# Patient Record
Sex: Female | Born: 1935 | Race: White | Hispanic: No | Smoking: Former smoker
Health system: Southern US, Community
[De-identification: ages and names within clinical notes are randomized; demographics above are authoritative.]

## PROBLEM LIST (undated history)

## (undated) DIAGNOSIS — N183 Chronic kidney disease, stage 3 unspecified: Secondary | ICD-10-CM

## (undated) DIAGNOSIS — R112 Nausea with vomiting, unspecified: Secondary | ICD-10-CM

## (undated) DIAGNOSIS — I2510 Atherosclerotic heart disease of native coronary artery without angina pectoris: Secondary | ICD-10-CM

## (undated) DIAGNOSIS — I10 Essential (primary) hypertension: Secondary | ICD-10-CM

## (undated) DIAGNOSIS — K8020 Calculus of gallbladder without cholecystitis without obstruction: Secondary | ICD-10-CM

## (undated) DIAGNOSIS — T8859XA Other complications of anesthesia, initial encounter: Secondary | ICD-10-CM

## (undated) DIAGNOSIS — Z87442 Personal history of urinary calculi: Secondary | ICD-10-CM

## (undated) DIAGNOSIS — Z9889 Other specified postprocedural states: Secondary | ICD-10-CM

## (undated) DIAGNOSIS — I77819 Aortic ectasia, unspecified site: Secondary | ICD-10-CM

## (undated) DIAGNOSIS — H3530 Unspecified macular degeneration: Secondary | ICD-10-CM

## (undated) DIAGNOSIS — Z859 Personal history of malignant neoplasm, unspecified: Secondary | ICD-10-CM

## (undated) DIAGNOSIS — Z85828 Personal history of other malignant neoplasm of skin: Secondary | ICD-10-CM

## (undated) DIAGNOSIS — N201 Calculus of ureter: Secondary | ICD-10-CM

## (undated) DIAGNOSIS — S42309A Unspecified fracture of shaft of humerus, unspecified arm, initial encounter for closed fracture: Secondary | ICD-10-CM

## (undated) DIAGNOSIS — E785 Hyperlipidemia, unspecified: Secondary | ICD-10-CM

## (undated) DIAGNOSIS — I480 Paroxysmal atrial fibrillation: Secondary | ICD-10-CM

## (undated) DIAGNOSIS — Z860100 Personal history of colon polyps, unspecified: Secondary | ICD-10-CM

## (undated) DIAGNOSIS — Z973 Presence of spectacles and contact lenses: Secondary | ICD-10-CM

## (undated) DIAGNOSIS — E538 Deficiency of other specified B group vitamins: Secondary | ICD-10-CM

## (undated) DIAGNOSIS — D510 Vitamin B12 deficiency anemia due to intrinsic factor deficiency: Secondary | ICD-10-CM

## (undated) DIAGNOSIS — M810 Age-related osteoporosis without current pathological fracture: Secondary | ICD-10-CM

## (undated) DIAGNOSIS — Z9861 Coronary angioplasty status: Secondary | ICD-10-CM

## (undated) DIAGNOSIS — S2239XA Fracture of one rib, unspecified side, initial encounter for closed fracture: Secondary | ICD-10-CM

## (undated) DIAGNOSIS — I252 Old myocardial infarction: Secondary | ICD-10-CM

## (undated) DIAGNOSIS — T4145XA Adverse effect of unspecified anesthetic, initial encounter: Secondary | ICD-10-CM

## (undated) HISTORY — PX: UMBILICAL HERNIA REPAIR: SHX196

## (undated) HISTORY — PX: DILATION AND CURETTAGE OF UTERUS: SHX78

## (undated) HISTORY — PX: NEUROPLASTY / TRANSPOSITION ULNAR NERVE AT ELBOW: SUR895

## (undated) HISTORY — DX: Deficiency of other specified B group vitamins: E53.8

## (undated) HISTORY — PX: ABDOMINAL HYSTERECTOMY: SHX81

## (undated) HISTORY — DX: Hyperlipidemia, unspecified: E78.5

## (undated) HISTORY — DX: Vitamin B12 deficiency anemia due to intrinsic factor deficiency: D51.0

## (undated) HISTORY — PX: OTHER SURGICAL HISTORY: SHX169

## (undated) HISTORY — PX: CORONARY ANGIOPLASTY: SHX604

## (undated) HISTORY — PX: TONSILLECTOMY: SUR1361

## (undated) HISTORY — DX: Unspecified fracture of shaft of humerus, unspecified arm, initial encounter for closed fracture: S42.309A

## (undated) HISTORY — PX: CATARACT EXTRACTION W/ INTRAOCULAR LENS  IMPLANT, BILATERAL: SHX1307

## (undated) HISTORY — DX: Age-related osteoporosis without current pathological fracture: M81.0

## (undated) HISTORY — DX: Essential (primary) hypertension: I10

## (undated) HISTORY — PX: CYSTOSCOPY/RETROGRADE/URETEROSCOPY/STONE EXTRACTION WITH BASKET: SHX5317

## (undated) HISTORY — PX: KNEE SURGERY: SHX244

## (undated) HISTORY — DX: Personal history of colon polyps, unspecified: Z86.0100

## (undated) HISTORY — DX: Personal history of colonic polyps: Z86.010

## (undated) HISTORY — DX: Calculus of gallbladder without cholecystitis without obstruction: K80.20

## (undated) HISTORY — DX: Aortic ectasia, unspecified site: I77.819

## (undated) HISTORY — DX: Fracture of one rib, unspecified side, initial encounter for closed fracture: S22.39XA

---

## 1992-09-02 DIAGNOSIS — I252 Old myocardial infarction: Secondary | ICD-10-CM

## 1992-09-02 HISTORY — DX: Old myocardial infarction: I25.2

## 1998-06-29 ENCOUNTER — Emergency Department (HOSPITAL_COMMUNITY): Admission: EM | Admit: 1998-06-29 | Discharge: 1998-06-29 | Payer: Self-pay | Admitting: Emergency Medicine

## 1998-06-30 ENCOUNTER — Encounter: Payer: Self-pay | Admitting: Emergency Medicine

## 1999-06-10 ENCOUNTER — Emergency Department (HOSPITAL_COMMUNITY): Admission: EM | Admit: 1999-06-10 | Discharge: 1999-06-10 | Payer: Self-pay | Admitting: *Deleted

## 1999-06-10 ENCOUNTER — Emergency Department (HOSPITAL_COMMUNITY): Admission: EM | Admit: 1999-06-10 | Discharge: 1999-06-10 | Payer: Self-pay | Admitting: Emergency Medicine

## 2000-02-21 ENCOUNTER — Encounter: Payer: Self-pay | Admitting: Specialist

## 2000-02-23 ENCOUNTER — Inpatient Hospital Stay (HOSPITAL_COMMUNITY): Admission: RE | Admit: 2000-02-23 | Discharge: 2000-02-24 | Payer: Self-pay | Admitting: Specialist

## 2001-06-17 ENCOUNTER — Encounter: Payer: Self-pay | Admitting: Specialist

## 2001-06-17 ENCOUNTER — Encounter: Admission: RE | Admit: 2001-06-17 | Discharge: 2001-06-17 | Payer: Self-pay | Admitting: Specialist

## 2002-07-29 ENCOUNTER — Encounter: Payer: Self-pay | Admitting: Internal Medicine

## 2002-07-29 ENCOUNTER — Encounter: Admission: RE | Admit: 2002-07-29 | Discharge: 2002-07-29 | Payer: Self-pay | Admitting: Internal Medicine

## 2002-09-21 ENCOUNTER — Encounter (INDEPENDENT_AMBULATORY_CARE_PROVIDER_SITE_OTHER): Payer: Self-pay | Admitting: Specialist

## 2002-09-21 ENCOUNTER — Ambulatory Visit (HOSPITAL_COMMUNITY): Admission: RE | Admit: 2002-09-21 | Discharge: 2002-09-21 | Payer: Self-pay | Admitting: Gastroenterology

## 2004-01-03 ENCOUNTER — Emergency Department (HOSPITAL_COMMUNITY): Admission: EM | Admit: 2004-01-03 | Discharge: 2004-01-03 | Payer: Self-pay | Admitting: Family Medicine

## 2004-06-27 ENCOUNTER — Ambulatory Visit: Payer: Self-pay | Admitting: Cardiology

## 2004-07-06 ENCOUNTER — Ambulatory Visit: Payer: Self-pay | Admitting: Cardiology

## 2004-08-11 ENCOUNTER — Emergency Department (HOSPITAL_COMMUNITY): Admission: EM | Admit: 2004-08-11 | Discharge: 2004-08-11 | Payer: Self-pay | Admitting: Internal Medicine

## 2004-09-04 ENCOUNTER — Ambulatory Visit: Payer: Self-pay

## 2004-09-04 ENCOUNTER — Ambulatory Visit: Payer: Self-pay | Admitting: Cardiology

## 2004-10-23 ENCOUNTER — Ambulatory Visit: Payer: Self-pay | Admitting: Cardiology

## 2005-10-14 ENCOUNTER — Emergency Department (HOSPITAL_COMMUNITY): Admission: EM | Admit: 2005-10-14 | Discharge: 2005-10-14 | Payer: Self-pay | Admitting: Family Medicine

## 2005-10-17 ENCOUNTER — Ambulatory Visit: Payer: Self-pay | Admitting: Cardiology

## 2006-07-19 ENCOUNTER — Emergency Department (HOSPITAL_COMMUNITY): Admission: EM | Admit: 2006-07-19 | Discharge: 2006-07-19 | Payer: Self-pay | Admitting: Family Medicine

## 2006-08-01 ENCOUNTER — Emergency Department (HOSPITAL_COMMUNITY): Admission: EM | Admit: 2006-08-01 | Discharge: 2006-08-02 | Payer: Self-pay | Admitting: Emergency Medicine

## 2006-08-01 ENCOUNTER — Emergency Department (HOSPITAL_COMMUNITY): Admission: EM | Admit: 2006-08-01 | Discharge: 2006-08-01 | Payer: Self-pay | Admitting: Emergency Medicine

## 2007-03-12 ENCOUNTER — Encounter: Admission: RE | Admit: 2007-03-12 | Discharge: 2007-03-12 | Payer: Self-pay | Admitting: Internal Medicine

## 2007-04-08 ENCOUNTER — Ambulatory Visit: Payer: Self-pay | Admitting: Cardiology

## 2007-04-08 LAB — CONVERTED CEMR LAB
BUN: 14 mg/dL (ref 6–23)
CO2: 28 meq/L (ref 19–32)
Calcium: 9.7 mg/dL (ref 8.4–10.5)
Chloride: 107 meq/L (ref 96–112)
Creatinine, Ser: 0.8 mg/dL (ref 0.4–1.2)
GFR calc Af Amer: 88 mL/min
GFR calc non Af Amer: 73 mL/min
Glucose, Bld: 110 mg/dL — ABNORMAL HIGH (ref 70–99)
Potassium: 3.8 meq/L (ref 3.5–5.1)
Sodium: 142 meq/L (ref 135–145)

## 2007-05-22 ENCOUNTER — Ambulatory Visit: Payer: Self-pay | Admitting: Cardiology

## 2007-12-02 ENCOUNTER — Emergency Department (HOSPITAL_COMMUNITY): Admission: EM | Admit: 2007-12-02 | Discharge: 2007-12-02 | Payer: Self-pay | Admitting: Family Medicine

## 2008-01-06 ENCOUNTER — Ambulatory Visit: Payer: Self-pay | Admitting: Cardiology

## 2008-06-18 ENCOUNTER — Emergency Department (HOSPITAL_COMMUNITY): Admission: EM | Admit: 2008-06-18 | Discharge: 2008-06-19 | Payer: Self-pay | Admitting: Emergency Medicine

## 2008-07-15 ENCOUNTER — Emergency Department (HOSPITAL_COMMUNITY): Admission: EM | Admit: 2008-07-15 | Discharge: 2008-07-15 | Payer: Self-pay | Admitting: Emergency Medicine

## 2008-12-23 ENCOUNTER — Encounter: Payer: Self-pay | Admitting: Cardiology

## 2008-12-29 DIAGNOSIS — I2510 Atherosclerotic heart disease of native coronary artery without angina pectoris: Secondary | ICD-10-CM | POA: Insufficient documentation

## 2008-12-29 DIAGNOSIS — I10 Essential (primary) hypertension: Secondary | ICD-10-CM | POA: Insufficient documentation

## 2008-12-29 DIAGNOSIS — I252 Old myocardial infarction: Secondary | ICD-10-CM | POA: Insufficient documentation

## 2008-12-29 DIAGNOSIS — E785 Hyperlipidemia, unspecified: Secondary | ICD-10-CM | POA: Insufficient documentation

## 2009-02-14 ENCOUNTER — Ambulatory Visit: Payer: Self-pay | Admitting: Cardiology

## 2009-02-14 DIAGNOSIS — K8020 Calculus of gallbladder without cholecystitis without obstruction: Secondary | ICD-10-CM | POA: Insufficient documentation

## 2009-03-21 ENCOUNTER — Emergency Department (HOSPITAL_COMMUNITY): Admission: EM | Admit: 2009-03-21 | Discharge: 2009-03-21 | Payer: Self-pay | Admitting: Emergency Medicine

## 2009-07-31 ENCOUNTER — Inpatient Hospital Stay (HOSPITAL_COMMUNITY): Admission: EM | Admit: 2009-07-31 | Discharge: 2009-08-01 | Payer: Self-pay | Admitting: Emergency Medicine

## 2009-11-02 HISTORY — PX: BREAST EXCISIONAL BIOPSY: SUR124

## 2010-01-27 ENCOUNTER — Encounter: Payer: Self-pay | Admitting: Cardiology

## 2010-03-03 ENCOUNTER — Ambulatory Visit: Payer: Self-pay | Admitting: Cardiology

## 2010-05-31 ENCOUNTER — Emergency Department (HOSPITAL_COMMUNITY)
Admission: EM | Admit: 2010-05-31 | Discharge: 2010-05-31 | Payer: Self-pay | Source: Home / Self Care | Admitting: Family Medicine

## 2010-07-06 NOTE — Assessment & Plan Note (Signed)
Summary: f1y   Visit Type:  1 year follow up Primary Provider:  Valentina Lucks  CC:  Blood pressure issues.  History of Present Illness: Doing well.  Pt is anemic, and now getting B12 shots.  He started recently. Patient has been checking BP, and she had follow up with Dr. Valentina Lucks at that time.  It was low, and then it got high.  They have been titrating since that time, and now making progress.  She feels better about it.  Last LDL wsa 49 and HDL 60.  (01/27/10).  Current Medications (verified): 1)  Atenolol 25 Mg Tabs (Atenolol) .... Take 1/2 Tablet Daily 2)  Lisinopril-Hydrochlorothiazide 10-12.5 Mg Tabs (Lisinopril-Hydrochlorothiazide) .... Take 1 Tablet By Mouth Once A Day 3)  Aspirin 81 Mg Tbec (Aspirin) .... Take One Tablet By Mouth Daily 4)  Lipitor 10 Mg Tabs (Atorvastatin Calcium) .... Take One Tablet By Mouth Daily. 5)  Nitroglycerin 0.4 Mg Subl (Nitroglycerin) .... One Tablet Under Tongue Every 5 Minutes As Needed For Chest Pain---May Repeat Times Three  Allergies: 1)  ! Penicillin 2)  ! * Antihistamines 3)  ! Sulfa 4)  ! * Ivp Dye  Past History:  Past Medical History: Last updated: 12/29/2008 Current Problems:  CAD (ICD-414.00) HYPERTENSION (ICD-401.9) MYOCARDIAL INFARCTION, HX OF (ICD-412) HYPERLIPIDEMIA (ICD-272.4)  Past Surgical History: Last updated: 12/29/2008 Hysterectomy Lumpectomy Angioplasty Right kee surgery Left carpal tunnel release  Family History: Last updated: 12/29/2008   Significant for heart disease and cancer.  Vital Signs:  Patient profile:   77 year old female Height:      66 inches Weight:      176.75 pounds BMI:     28.63 Pulse rate:   71 / minute Pulse rhythm:   regular Resp:     18 per minute BP sitting:   124 / 80  (left arm) Cuff size:   large  Vitals Entered By: Vikki Ports (March 03, 2010 9:12 AM)  Physical Exam  General:  Well developed, well nourished, in no acute distress. Head:  normocephalic and  atraumatic Eyes:  PERRLA/EOM intact; conjunctiva and lids normal. Lungs:  Clear bilaterally to auscultation and percussion. Heart:  Non-displaced PMI, chest non-tender; regular rate and rhythm, S1, S2 without murmurs, rubs or gallops. Carotid upstroke normal, no bruit.  Pulses:  pulses normal in all 4 extremities Extremities:  No clubbing or cyanosis. Neurologic:  Alert and oriented x 3.   EKG  Procedure date:  03/03/2010  Findings:      NSR.  LAD.  Delay in R wave progression.  No change from prior tracing.    Impression & Recommendations:  Problem # 1:  CAD (ICD-414.00) stable at present.  No chest pain.  Last PCI about fifteen years ago.  Doing well. Her updated medication list for this problem includes:    Atenolol 25 Mg Tabs (Atenolol) .[name]... Take 1/2 tablet daily    Lisinopril-hydrochlorothiazide 10-12.5 Mg Tabs (Lisinopril-hydrochlorothiazide) .[name]... Take 1 tablet by mouth once a day    Aspirin 81 Mg Tbec (Aspirin) .[name]... Take one tablet by mouth daily    Nitroglycerin 0.4 Mg Subl (Nitroglycerin) ..... One tablet under tongue every 5 minutes as needed for chest pain---may repeat times three  Problem # 2:  HYPERTENSION (ICD-401.9) see notes about recent adjustments.  She is stable on present regimen. Her updated medication list for this problem includes:    Atenolol 25 Mg Tabs (Atenolol) .[name]... Take 1/2 tablet daily    Lisinopril-hydrochlorothiazide 10-12.5 Mg Tabs (Lisinopril-hydrochlorothiazide) .Marland KitchenMarland KitchenMarland KitchenMarland Kitchen  Take 1 tablet by mouth once a day    Aspirin 81 Mg Tbec (Aspirin) .[name]... Take one tablet by mouth daily  Problem # 3:  HYPERLIPIDEMIA (ICD-272.4) Nicely controlled by Dr. Valentina Lucks. Her updated medication list for this problem includes:    Lipitor 10 Mg Tabs (Atorvastatin calcium) .[name]... Take one tablet by mouth daily.  Patient Instructions: 1)  Your physician recommends that you schedule a follow-up appointment in: 12 months 2)  Your physician recommends that you continue on  your current medications as directed. Please refer to the Current Medication list given to you today.

## 2010-07-06 NOTE — Assessment & Plan Note (Signed)
Summary: f1y   Visit Type:  1 year follow up  CC:  Some chest pains.  History of Present Illness: Patient has been having some chest pain.  She thinks it is secondary to gall stones.  Will have occasional attacks with greasy foods.  The episodes are largely related to upper epigastrium, and like a belt around upper abdomen.  No exertional chest tightness.  Had a recent problem with BP, and Dr. Valentina Lucks cut down. Has been working with Dr. Valentina Lucks.  Current Medications (verified): 1)  Atenolol 25 Mg Tabs (Atenolol) .... Take 1/2 Tablet Daily 2)  Lisinopril-Hydrochlorothiazide 10-12.5 Mg Tabs (Lisinopril-Hydrochlorothiazide) .... Take 1 Tablet By Mouth Once A Day 3)  Aspirin 81 Mg Tbec (Aspirin) .... Take One Tablet By Mouth Daily 4)  Lipitor 10 Mg Tabs (Atorvastatin Calcium) .... Take One Tablet By Mouth Daily. 5)  Nitroglycerin 0.4 Mg Subl (Nitroglycerin) .... One Tablet Under Tongue Every 5 Minutes As Needed For Chest Pain---May Repeat Times Three  Allergies: 1)  ! Penicillin 2)  ! * Antihistamines 3)  ! Sulfa 4)  ! * Ivp Dye  Vital Signs:  Patient profile:   77 year old female Height:      66 inches Weight:      182 pounds Pulse rate:   73 / minute Pulse rhythm:   regular Resp:     18 per minute BP sitting:   148 / 92  (left arm) Cuff size:   large  Vitals Entered By: Vikki Ports (February 14, 2009 11:14 AM)  Physical Exam  General:  Well developed, well nourished, in no acute distress. Lungs:  Clear bilaterally to auscultation and percussion. Heart:  Non-displaced PMI, chest non-tender; regular rate and rhythm, S1, S2 without murmurs, rubs or gallops. Carotid upstroke normal, no bruit. Normal abdominal aortic size, no bruits. Femorals normal pulses, no bruits. Pedals normal pulses. No edema, no varicosities. Extremities:  No clubbing or cyanosis.   EKG  Procedure date:  02/14/2009  Findings:      left axis deviation.  Delay R wave progression.  Non specific T  abnormality.  Impression & Recommendations:  Problem # 1:  CHOLELITHIASIS (ICD-574.20) Been encouraged by surgeon to hold off on surgery.  Problem # 2:  CAD (ICD-414.00)  No suggestive symptoms. Her updated medication list for this problem includes:    Atenolol 25 Mg Tabs (Atenolol) .[name]... Take 1/2 tablet daily    Lisinopril-hydrochlorothiazide 10-12.5 Mg Tabs (Lisinopril-hydrochlorothiazide) .[name]... Take 1 tablet by mouth once a day    Aspirin 81 Mg Tbec (Aspirin) .[name]... Take one tablet by mouth daily    Nitroglycerin 0.4 Mg Subl (Nitroglycerin) ..... One tablet under tongue every 5 minutes as needed for chest pain---may repeat times three  Orders: EKG w/ Interpretation (93000)  Problem # 3:  HYPERTENSION (ICD-401.9) Has restarted her meds now.  Needs to follow up with Dr. Valentina Lucks. Her updated medication list for this problem includes:    Atenolol 25 Mg Tabs (Atenolol) .[name]... Take 1/2 tablet daily    Lisinopril-hydrochlorothiazide 10-12.5 Mg Tabs (Lisinopril-hydrochlorothiazide) .[name]... Take 1 tablet by mouth once a day    Aspirin 81 Mg Tbec (Aspirin) .[name]... Take one tablet by mouth daily  Problem # 4:  HYPERLIPIDEMIA (ICD-272.4) LDL at 54.  HDL 87. Her updated medication list for this problem includes:    Lipitor 10 Mg Tabs (Atorvastatin calcium) .[name]... Take one tablet by mouth daily.  Patient Instructions: 1)  Your physician recommends that you schedule a follow-up appointment in:  1 year Prescriptions: NITROGLYCERIN 0.4 MG SUBL (NITROGLYCERIN) One tablet under tongue every 5 minutes as needed for chest pain---may repeat times three  #25 x 3   Entered by:   Dossie Arbour, RN, BSN   Authorized by:   Ronaldo Miyamoto, MD, [facility]   Signed by:   Dossie Arbour, RN, BSN on 02/14/2009   Method used:   Electronically to        [facility]  [address] [phone]* (retail)       [address]       [place], [place]  [address]       Ph: [phone] or  [phone]       Fax: [phone]   RxID:   [id number]

## 2010-08-25 LAB — CBC
HCT: 45.6 % (ref 36.0–46.0)
Hemoglobin: 15 g/dL (ref 12.0–15.0)
MCHC: 32.9 g/dL (ref 30.0–36.0)
MCV: 95.3 fL (ref 78.0–100.0)
Platelets: 175 10*3/uL (ref 150–400)
RBC: 4.79 MIL/uL (ref 3.87–5.11)
RDW: 13.9 % (ref 11.5–15.5)
WBC: 4.8 10*3/uL (ref 4.0–10.5)

## 2010-08-25 LAB — CARDIAC PANEL(CRET KIN+CKTOT+MB+TROPI)
CK, MB: 1.6 ng/mL (ref 0.3–4.0)
CK, MB: 1.8 ng/mL (ref 0.3–4.0)
CK, MB: 2 ng/mL (ref 0.3–4.0)
Relative Index: 1.3 (ref 0.0–2.5)
Relative Index: 1.5 (ref 0.0–2.5)
Relative Index: 1.5 (ref 0.0–2.5)
Total CK: 120 U/L (ref 7–177)
Total CK: 122 U/L (ref 7–177)
Total CK: 132 U/L (ref 7–177)
Troponin I: <0.01 ng/mL (ref 0.00–0.06)
Troponin I: <0.01 ng/mL (ref 0.00–0.06)
Troponin I: <0.01 ng/mL (ref 0.00–0.06)

## 2010-08-25 LAB — BASIC METABOLIC PANEL
BUN: 12 mg/dL (ref 6–23)
CO2: 27 mEq/L (ref 19–32)
Calcium: 9.9 mg/dL (ref 8.4–10.5)
Chloride: 108 mEq/L (ref 96–112)
Creatinine, Ser: 0.81 mg/dL (ref 0.4–1.2)
GFR calc Af Amer: >60 mL/min (ref >60)
GFR calc non Af Amer: >60 mL/min (ref >60)
Glucose, Bld: 128 mg/dL — ABNORMAL HIGH (ref 70–99)
Potassium: 3.9 mEq/L (ref 3.5–5.1)
Sodium: 143 mEq/L (ref 135–145)

## 2010-08-25 LAB — URINALYSIS, ROUTINE W REFLEX MICROSCOPIC
Bilirubin Urine: NEGATIVE
Glucose, UA: NEGATIVE mg/dL
Hgb urine dipstick: NEGATIVE
Ketones, ur: NEGATIVE mg/dL
Nitrite: NEGATIVE
Protein, ur: NEGATIVE mg/dL
Specific Gravity, Urine: 1.011 (ref 1.005–1.030)
Urobilinogen, UA: 1 mg/dL (ref 0.0–1.0)
pH: 7.5 (ref 5.0–8.0)

## 2010-08-25 LAB — DIFFERENTIAL
Basophils Absolute: 0 10*3/uL (ref 0.0–0.1)
Basophils Relative: 1 % (ref 0–1)
Eosinophils Absolute: 0.2 10*3/uL (ref 0.0–0.7)
Eosinophils Relative: 5 % (ref 0–5)
Lymphocytes Relative: 34 % (ref 12–46)
Lymphs Abs: 1.6 10*3/uL (ref 0.7–4.0)
Monocytes Absolute: 0.4 10*3/uL (ref 0.1–1.0)
Monocytes Relative: 8 % (ref 3–12)
Neutro Abs: 2.5 10*3/uL (ref 1.7–7.7)
Neutrophils Relative %: 53 % (ref 43–77)

## 2010-08-25 LAB — HEPATIC FUNCTION PANEL
ALT: 18 U/L (ref 0–35)
AST: 25 U/L (ref 0–37)
Albumin: 3.7 g/dL (ref 3.5–5.2)
Alkaline Phosphatase: 99 U/L (ref 39–117)
Bilirubin, Direct: 0.2 mg/dL (ref 0.0–0.3)
Indirect Bilirubin: 1 mg/dL — ABNORMAL HIGH (ref 0.3–0.9)
Total Bilirubin: 1.2 mg/dL (ref 0.3–1.2)
Total Protein: 6.9 g/dL (ref 6.0–8.3)

## 2010-08-25 LAB — POCT CARDIAC MARKERS
CKMB, poc: 1.1 ng/mL (ref 1.0–8.0)
Myoglobin, poc: 80.2 ng/mL (ref 12–200)
Troponin i, poc: <0.05 ng/mL (ref 0.00–0.09)

## 2010-08-25 LAB — LIPASE, BLOOD: Lipase: 25 U/L (ref 11–59)

## 2010-09-18 LAB — URINALYSIS, ROUTINE W REFLEX MICROSCOPIC
Bilirubin Urine: NEGATIVE
Glucose, UA: NEGATIVE mg/dL
Ketones, ur: NEGATIVE mg/dL
Leukocytes, UA: NEGATIVE
Nitrite: NEGATIVE
Protein, ur: NEGATIVE mg/dL
Specific Gravity, Urine: 1.03 (ref 1.005–1.030)
Urobilinogen, UA: 0.2 mg/dL (ref 0.0–1.0)
pH: 5.5 (ref 5.0–8.0)

## 2010-09-18 LAB — CBC
HCT: 39.1 % (ref 36.0–46.0)
Hemoglobin: 13.4 g/dL (ref 12.0–15.0)
MCHC: 34.3 g/dL (ref 30.0–36.0)
MCV: 94.6 fL (ref 78.0–100.0)
Platelets: 194 10*3/uL (ref 150–400)
RBC: 4.14 MIL/uL (ref 3.87–5.11)
RDW: 13.9 % (ref 11.5–15.5)
WBC: 5.2 10*3/uL (ref 4.0–10.5)

## 2010-09-18 LAB — COMPREHENSIVE METABOLIC PANEL
ALT: 15 U/L (ref 0–35)
AST: 21 U/L (ref 0–37)
Albumin: 3.3 g/dL — ABNORMAL LOW (ref 3.5–5.2)
Alkaline Phosphatase: 66 U/L (ref 39–117)
BUN: 16 mg/dL (ref 6–23)
CO2: 28 mEq/L (ref 19–32)
Calcium: 9.8 mg/dL (ref 8.4–10.5)
Chloride: 106 mEq/L (ref 96–112)
Creatinine, Ser: 0.85 mg/dL (ref 0.4–1.2)
GFR calc Af Amer: >60 mL/min (ref >60)
GFR calc non Af Amer: >60 mL/min (ref >60)
Glucose, Bld: 128 mg/dL — ABNORMAL HIGH (ref 70–99)
Potassium: 3.6 mEq/L (ref 3.5–5.1)
Sodium: 143 mEq/L (ref 135–145)
Total Bilirubin: 1 mg/dL (ref 0.3–1.2)
Total Protein: 6.5 g/dL (ref 6.0–8.3)

## 2010-09-18 LAB — DIFFERENTIAL
Basophils Absolute: 0.1 10*3/uL (ref 0.0–0.1)
Basophils Relative: 1 % (ref 0–1)
Eosinophils Absolute: 0.4 10*3/uL (ref 0.0–0.7)
Eosinophils Relative: 7 % — ABNORMAL HIGH (ref 0–5)
Lymphocytes Relative: 25 % (ref 12–46)
Lymphs Abs: 1.3 10*3/uL (ref 0.7–4.0)
Monocytes Absolute: 0.6 10*3/uL (ref 0.1–1.0)
Monocytes Relative: 12 % (ref 3–12)
Neutro Abs: 2.9 10*3/uL (ref 1.7–7.7)
Neutrophils Relative %: 56 % (ref 43–77)

## 2010-09-18 LAB — URINE MICROSCOPIC-ADD ON

## 2010-09-18 LAB — LIPASE, BLOOD: Lipase: 28 U/L (ref 11–59)

## 2010-09-19 LAB — DIFFERENTIAL
Basophils Absolute: 0 10*3/uL (ref 0.0–0.1)
Basophils Relative: 1 % (ref 0–1)
Eosinophils Absolute: 0.6 10*3/uL (ref 0.0–0.7)
Eosinophils Relative: 7 % — ABNORMAL HIGH (ref 0–5)
Lymphocytes Relative: 25 % (ref 12–46)
Lymphs Abs: 1.9 10*3/uL (ref 0.7–4.0)
Monocytes Absolute: 0.7 10*3/uL (ref 0.1–1.0)
Monocytes Relative: 10 % (ref 3–12)
Neutro Abs: 4.3 10*3/uL (ref 1.7–7.7)
Neutrophils Relative %: 57 % (ref 43–77)

## 2010-09-19 LAB — CBC
HCT: 45.2 % (ref 36.0–46.0)
Hemoglobin: 15.3 g/dL — ABNORMAL HIGH (ref 12.0–15.0)
MCHC: 33.9 g/dL (ref 30.0–36.0)
MCV: 95.6 fL (ref 78.0–100.0)
Platelets: 150 10*3/uL (ref 150–400)
RBC: 4.73 MIL/uL (ref 3.87–5.11)
RDW: 14.5 % (ref 11.5–15.5)
WBC: 7.6 10*3/uL (ref 4.0–10.5)

## 2010-09-19 LAB — COMPREHENSIVE METABOLIC PANEL
ALT: 22 U/L (ref 0–35)
AST: 22 U/L (ref 0–37)
Albumin: 3.6 g/dL (ref 3.5–5.2)
Alkaline Phosphatase: 81 U/L (ref 39–117)
BUN: 10 mg/dL (ref 6–23)
CO2: 26 mEq/L (ref 19–32)
Calcium: 9.6 mg/dL (ref 8.4–10.5)
Chloride: 106 mEq/L (ref 96–112)
Creatinine, Ser: 0.82 mg/dL (ref 0.4–1.2)
GFR calc Af Amer: >60 mL/min (ref >60)
GFR calc non Af Amer: >60 mL/min (ref >60)
Glucose, Bld: 108 mg/dL — ABNORMAL HIGH (ref 70–99)
Potassium: 4.2 mEq/L (ref 3.5–5.1)
Sodium: 140 mEq/L (ref 135–145)
Total Bilirubin: 1.4 mg/dL — ABNORMAL HIGH (ref 0.3–1.2)
Total Protein: 6.9 g/dL (ref 6.0–8.3)

## 2010-09-19 LAB — LIPASE, BLOOD: Lipase: 26 U/L (ref 11–59)

## 2010-10-17 NOTE — Assessment & Plan Note (Signed)
[facility]                            CARDIOLOGY OFFICE NOTE   SAUSHA, RAYMOND                        MRN:          [id number]  DATE:04/08/2007                            DOB:          03-19-1923    The patient is in for follow-up.  She really is doing quite well.  She  occasionally has some discomfort in the right upper quadrant, and she  apparently has gallstones that Dr. Valentina Lucks has recommended be considered  for removal.  Her last treadmill was in 2006 and was nonischemic.  The  patient had a [facility] of her left anterior descending artery in April of  1994.  She has not had recurrent ischemia since that time.  She feels  well.   MEDICATIONS:  1. Atenolol 25 mg 1/2 tablet daily.  2. Lisinopril hydrochlorothiazide 10/12.5 mg daily.  3. Aspirin 81 mg daily.  4. Lipitor 10 mg daily.  Her LDL apparently has been under control.   PHYSICAL EXAMINATION:  GENERAL:  She is alert and oriented.  VITAL SIGNS:  Weight 183, blood pressure 134/80, pulse 75.  LUNGS:  Fields are clear.  HEART:  Rhythm is regular.   The EKG reveals normal sinus rhythm.  There is nonspecific T wave  abnormalities and a leftward oriented axis.  When compared to the  previous tracings, there is no significant interval change other than  the T wave flattening.  When compared to the previous tracing, the T  wave flattening is slightly more prominent than on previous tracings.   Overall she is stable.  I am in receipt of Dr. Jone Baseman laboratory  studies which include a sugar of 114.  Her LDL is 48 and her HDL is 65,  so these were excellent.  Her potassium was 3.6 back in the early part  of the year.  We will go ahead and repeat a potassium level because in  fact I think that the T wave flattening could be related to that.  Moreover, she did present with some chest pain.   I have suggested that we do an exercise treadmill study.  This will be  done prior to her surgery.  She  wants to defer this at the present time  until January.  She does have multiple small gallstones by gallbladder  and she had a CT angio of the chest which revealed no evidence of  pulmonary embolism, but cholelithiasis.   Her enzymes in the hospital were negative.  As a result, we will go  ahead and get a basic metabolic profile.  I have suggested the treadmill  and we will try to get this done within the next few months as per her  request of timing.  Should she have problems in the interim, she is to  contact us.  I would suggest a treadmill be done prior to any  intervention such as a cholecystectomy.     Arturo Morton. Riley Kill, MD, [facility]  Electronically Signed    TDS/MedQ  DD: 04/08/2007  DT: 04/09/2007  Job #: [id number]   cc:   Excell Seltzer.  Laurann Montana, M.D.

## 2010-10-17 NOTE — Letter (Signed)
January 06, 2008    Thora Lance, M.D.  [address]  [place], [place] [address]   RE:  Shelly Obrien, Shelly Obrien  MRN:  [id number]  /  DOB:  1922-06-13   Dear Jonny Ruiz,   I had the pleasure of seeing Shelly Obrien in the office today in  followup.  She told me that she did not go ahead and have her  cholecystectomy done.  She has very rare episodes, and she told me that  you felt that given her age, it would probably be the best to hold off  unless it became a serious issue.  From a cardiac standpoint, she seems  to be doing beautifully.   On physical, the blood pressure is 130/84, the pulse is 59.  The lung  fields are clear and the cardiac rhythm is regular.   Her electrocardiogram reveals left axis deviation, otherwise  unremarkable.  I am pleased with how she is doing.  I made no  recommendations with regard to her medications and thought that this  would be best managed by you.  We will be happy to see her back in  followup in any time.  We have scheduled her for a 1-year cardiology  visit, but if she needs to be seen sooner, please do not hesitate to let  me know and I will be happy to do so.    Sincerely,      Arturo Morton. Riley Kill, MD, [facility]  Electronically Signed    TDS/MedQ  DD: 01/06/2008  DT: 01/07/2008  Job #: [id number]

## 2010-10-17 NOTE — Letter (Signed)
May 22, 2007    Thora Lance, M.D.  [address]  [place], [place] [address]   RE:  Shelly Obrien, Shelly Obrien  MRN:  [id number]  /  DOB:  20-Aug-1922   Dear Shelly Obrien:   Shelly Obrien underwent exercise testing today.  She had no evidence of  exercise induced chest pain or significant ST depression.  Her exercise  tolerance load was moderate.  I see no contraindications for her  proceeding on with gallbladder surgery if this is necessary.  We will be  happy to see her during her hospitalization.  Thanks for allowing me to  share in her care.    Sincerely,      Arturo Morton. Riley Kill, MD, [facility]  Electronically Signed    TDS/MedQ  DD: 05/22/2007  DT: 05/23/2007  Job #: [id number]

## 2010-10-17 NOTE — Assessment & Plan Note (Signed)
[facility]                            CARDIOLOGY OFFICE NOTE   NAME:CARTERShaniqua, Guillot                        MRN:          [id number]  DATE:05/22/2007                            DOB:          12-Aug-1922    Exercised 4 minutes 37 seconds, maximum heart rate 133, percent of PMHR  is 97%.  Comments:  Rheba exercised today on the Bruce protocol, exercise  tolerance was fair, she experienced no chest pain, the test was  terminated due to fatigue.  There was left axis deviation.  Maximum  stress, there was a rare premature atrial contraction, but there was no  significant ST depression.  The study was felt to be negative for  ischemia.   Ms. Lemaire is a very delightful lady.  She has gallstones, she needs  cholecystectomy.  The current study demonstrates no evidence of exercise-  induced chest pain or significant ischemia.  She previously underwent  percutaneous intervention of the left anterior descending artery and has  done clinically well since that time.  We will see her back in followup.     Arturo Morton. Riley Kill, MD, [facility]  Electronically Signed    TDS/MedQ  DD: 05/22/2007  DT: 05/23/2007  Job #: [id number]

## 2010-10-20 NOTE — Discharge Summary (Signed)
[facility]  Patient:    Shelly Obrien, Shelly Obrien                     MRN: [id number] Adm. Date:  [id number] Disc. Date: [id number] Attending:  Montez Morita, Philips John Dictator:   Ralene Bathe, P.A.                           Discharge Summary  ADMISSION DIAGNOSES: 1. 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 2. Hypertension. 3. Remote history of myocardial infarction.  PROCEDURES:  Ulnar nerve transposition at the elbow as well as carpal tunnel release on the left.  Surgeon: [employer]. Montez Morita, M.D, assistant: Druscilla Brownie. Shela Nevin, P.A., general anesthesia.  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  She has severe entrapment and was quite symptomatic.  She, therefore, wished to have decompression. Risks and benefits were discussed with the patient.  She is in agreement with procedure.  HOSPITAL COURSE:  The patient was admitted, underwent the above-named procedure and tolerated this well.  All appropriate IV antibiotics and analgesics were provided.  She was kept overnight for pain control and neurovascular checks.  On postop day #1, she was afebrile.  Vitals signs were stable, and she was neurovascularly intact.  Some thumb numbness had improved. She was still a bit numb in the ulnar nerve distribution as expected.  Her pain was controlled on p.o. analgesics.  She was ambulating and voiding without difficulty.  At this time she was felt medically and orthopedically stable for discharge to home.  LABORATORY DATA:  Admission hemogram and chemistries were within normal limits except for a mild elevation of glucose to 177.  Urinalysis was within normal limits.  X-rays showed mild cardiomegaly and parenchymal changes suggestive of asthma or chronic bronchitis.  CONDITION UPON DISCHARGE:  Stable and improved.  DISCHARGE PLAN:  The patient  is being discharged to home in the care of her family.  Wear sling when up.  Keep cast clean and dry.  Follow two weeks from date of surgery, call for time.  Resume home medications and home diet.  She was given the following prescriptions: 1. Percocet 5 mg, #40, 1 to 2 q.4-6h. p.r.n. pain. 2. Skelaxin 400 mg, #60, 1 to 2 q.6h. p.r.n. spasm. 3. Vioxx samples were left at the the office.  She will go by and pick them    up. DD:  03/13/00 TD:  03/14/00 Job: [id number]

## 2010-10-20 NOTE — Op Note (Signed)
[facility]  Patient:    Shelly Obrien, ANDERSSON                     MRN: [id number] Proc. Date: 02/23/00 Attending:  Philips J. Montez Morita, M.D.                           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  OPERATIVE PROCEDURE:  Release of carpal tunnel, neurolysis of the ulnar nerve with anterior transfer of subcutaneous tissue.  SURGEON:  Philips J. Montez Morita, M.D.  ASSISTANT:  Cherly Beach, P.A.  ANESTHESIA:  General.  DESCRIPTION OF PROCEDURE:  After suitable general anesthesia, the left arm was prepped and draped.  A sterile tourniquet was used and after exsanguination, was inflated to 250 mmHg. DD:  02/23/00 TD:  02/25/00 Job: [id number]

## 2010-10-20 NOTE — Op Note (Signed)
[facility]  Patient:    Shelly Obrien, Shelly Obrien                     MRN: [id number] Proc. Date: 02/23/00 Adm. Date:  [id number] Disc. Date: [id number] Attending:  Montez Morita, Philips John                           Operative Report  PREOPERATIVE DIAGNOSIS:  Left tardy ulnar nerve palsy and left carpal tunnel.  POSTOPERATIVE DIAGNOSIS:  Left tardy ulnar nerve palsy and left carpal tunnel.  OPERATIVE PROCEDURES: 1. Left carpal tunnel release. 2. Neurolysis of the left ulnar nerve. 3. Anterior transfer in the subcutaneous tissue of the ulnar nerve.  SURGEON:  Philips J. Montez Morita, M.D.  ASSISTANT:  Druscilla Brownie. Shela Nevin, P.A.  DESCRIPTION OF PROCEDURE:  After suitable general anesthesia, the left arm was prepped and draped routinely which included the use of a sterile tourniquet. Esmarch was used to exsanguinate it, and the tourniquet was inflated to 250 mmHg.  The carpal tunnel was released first, using an S-shaped incision in the thenar crease with a zigzag at the wrist.  Tunnel was exposed with retractors and use of ______ loops, the bipolar for coagulation and with a pair of Mayo scissors, released all the way through quite a bit of thick, heavy synovitis was freed up with blunt dissection.  The wound was closed with interrupted nylon and compression dressing applied.  We then went to the elbow where a medial incision was made proximal to the medial epicondyle extending over it and then somewhat anteriorly.  A nice subcutaneous flap was created.  The ulnar nerve was then freed up in the groove and was markedly adherent, particularly at the elbow with adhesions and contractures.  The articular branches were sacrificed and was allowed to sublux the nerve forward, and it was held in the forward position with a tunnel created by suturing the deep fat of the anterior flap over the medial epicondyle and the surrounding tissues proximal and distal.  No impingement of  the nerve was felt to exist pre and posttunnel was well released in the muscle area distally and proximally the ______  was released so there would be no impingement there. The bipolar was used for good coagulation.  Routine wound closure with coated Vicryl and interrupted nylon in the skin, compression dressing, and the tourniquet let down, a cast, a long-arm posterior splint applied with the wrist in slight extension and the elbow at 90 degrees.  She went to recovery in good condition. DD:  02/23/00 TD:  02/25/00 Job: [id number]

## 2010-10-20 NOTE — Op Note (Signed)
   NAME:  LETICA, Shelly Obrien                        ACCOUNT NO.:  [id number]   MEDICAL RECORD NO.:  [id number]                   PATIENT TYPE:  AMB   LOCATION:  ENDO                                 FACILITY:  [facility]   PHYSICIAN:  Danise Edge, M.D.                DATE OF BIRTH:  1922-11-10   DATE OF PROCEDURE:  09/21/2002  DATE OF DISCHARGE:                                 OPERATIVE REPORT   PROCEDURE:  Colonoscopy.   INDICATIONS FOR PROCEDURE:  [name] is a 77 year old female.  She was born July 23, 1922. Her daughter and mother died of colon  cancer.   ENDOSCOPIST:  Charolett Bumpers, M.D.   PREMEDICATION:  Versed 5 mg, Demerol 50 mg .   DESCRIPTION OF PROCEDURE:  After obtaining informed consent, [name] was  placed in the left lateral decubitus position. I administered intravenous  Demerol and intravenous Versed to achieve conscious sedation for the  procedure. The patient's blood pressure, oxygen saturation and cardiac  rhythm were monitored throughout the procedure and documented in the medical  record.   Anal inspection was normal. Digital rectal exam was normal. The Olympus  pediatric video colonoscope was introduced into the rectum and advanced to  the cecum. Colonic preparation for the exam today was excellent.   RECTUM:  Normal.   SIGMOID COLON AND DESCENDING COLON:  Left colonic diverticulosis. At  approximately 45 cm from the anal verge, two 2 mm sessile polyps were  removed with the electrocautery snare.   SPLENIC FLEXURE:  Normal.   TRANSVERSE COLON:  Normal.   HEPATIC FLEXURE:  Normal.   ASCENDING COLON:  Normal.   CECUM AND ILEOCECAL VALVE:  Normal.    ASSESSMENT:  1. Left colonic diverticulosis.  2. Two small polyps were removed from the sigmoid colon at 45 cm from the     anal verge.   RECOMMENDATIONS:  Repeat colonoscopy in five years.                                               Danise Edge, M.D.    MJ/MEDQ  D:   09/21/2002  T:  09/21/2002  Job:  [id number]   cc:   Thora Lance, M.D.  [address]  [place]  [place] [address]  Fax: [phone]

## 2010-10-20 NOTE — H&P (Signed)
[facility]  Patient:    Shelly Obrien, Shelly Obrien                          MRN: [id number] Attending:  Philips J. Montez Morita, M.D. Dictator:   Grayland Jack, P.A. CC:         Arturo Morton. Riley Kill, M.D. [facility]   History and Physical  DATE OF BIRTH:  1923-03-26.  CHIEF COMPLAINT:  Left hand pain and numbness.  HISTORY OF PRESENT ILLNESS:  Shelly Obrien has had over a one-year history of left upper extremity numbness, tingling and pain.  It had gotten significantly worse over the past three to four months.  She has been tried on numerous conservative treatments including oral anti-inflammatories and activity modification.  Unfortunately, Shelly Obrien does have a husband at home who is a bilateral amputee and she does a lot of lifting.  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  It was felt due to these positive findings and her significant increase in symptoms that she would benefit from surgical intervention.  Patients cardiologist, Dr. Arturo Morton. Stuckey, has given [place] premedical clearance, due to her significant history of acute MI some five years ago.  The risks, benefits, alternatives and complications to surgery were discussed with the patient in detail and she agrees to proceed.  ALLERGIES:  PENICILLIN.  CURRENT MEDICATIONS 1. Vasotec 10 mg p.o. q.d. 2. Aspirin 81 mg p.o. q.d.  PAST MEDICAL HISTORY 1. Hypertension. 2. Status post acute MI.  PAST SURGICAL HISTORY:  Significant for lumpectomy, hysterectomy, angioplasty and right knee surgery.  SOCIAL HISTORY:  Again, this patient lives at home with her husband who is a bilateral amputee.  She denies tobacco and alcohol use.  Patient continues to live a very active lifestyle.  FAMILY HISTORY:  Significant for heart disease and cancer.  REVIEW OF SYSTEMS:  GENERAL:  Patient denies fever, chills, weight  changes or malaise.  HEENT:  Patient denies headaches, visual changes, ear pain or pressure, nasal congestion or discharge, sore throats or dysphagia. RESPIRATORY:  Patient denies productive cough, hemoptysis or shortness of breath.  CARDIAC:  Patient denies chest pain, palpitations, angina or dyspnea on exertion.  GI:  Patient denies abdominal pain, nausea, vomiting, constipation, diarrhea or bloody stools.  GU:  Patient denies dysuria, hematuria, increasing frequency or hesitancy.  MUSCULOSKELETAL:  See HPI. HEMATOLOGICAL:  Patient denies history of anemia or bleeding tendencies.  PHYSICAL EXAMINATION  VITAL SIGNS:  Temperature:  Afebrile.  Pulse 74.  Respirations 16.  Blood pressure 135/80.  GENERAL:  This is a well-developed, well-nourished 77 year old female in no acute distress.  HEENT:  Normocephalic, atraumatic.  Pupils are equal, round and reactive to light.  Extraocular muscles are intact.  NECK:  Supple.  No JVD, no lymphadenopathy and no carotid bruits appreciated.  CHEST:  Clear to auscultation.  No rales, no rhonchi and no wheezing bilaterally.  HEART:  Regular rate and rhythm.  No rubs.  No gallops.  No murmurs.  ABDOMEN:  Positive bowel sounds.  Soft and nontender.  No organomegaly appreciated.  BREASTS:  Not done, not pertinent to present illness.  RECTAL:  Not done, not pertinent to present illness.  GU:  Not done, not pertinent to present illness.  EXTREMITIES:  Patients left upper extremity does show some musculature atrophy.  She does have marked weakness.  She is neurovascularly intact.  ADMITTING DIAGNOSES 1. 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 2. Median nerve compression at the left wrist. 3. Hypertension. 4. Status post acute myocardial infarction.  PLAN:  This patient will be admitted to [facility] on February 23, 2000 to undergo anterior transposition of the ulnar nerve and carpal tunnel release, both on the left upper  extremity.  Surgeon will be Dr. Michael Litter. Montez Morita.  The postoperative course has been discussed with the patient in detail.  All questions have been encouraged and answered. DD:  02/21/00 TD:  02/22/00 Job: [id number]

## 2010-10-23 ENCOUNTER — Encounter: Payer: Self-pay | Admitting: Cardiology

## 2010-12-07 ENCOUNTER — Emergency Department (HOSPITAL_COMMUNITY)
Admission: EM | Admit: 2010-12-07 | Discharge: 2010-12-07 | Disposition: A | Discharge status: Home / Self Care | Payer: Medicare Other | Attending: Emergency Medicine | Admitting: Emergency Medicine

## 2010-12-07 ENCOUNTER — Emergency Department (HOSPITAL_COMMUNITY): Payer: Medicare Other

## 2010-12-07 DIAGNOSIS — I10 Essential (primary) hypertension: Secondary | ICD-10-CM | POA: Insufficient documentation

## 2010-12-07 DIAGNOSIS — I2510 Atherosclerotic heart disease of native coronary artery without angina pectoris: Secondary | ICD-10-CM | POA: Insufficient documentation

## 2010-12-07 DIAGNOSIS — N390 Urinary tract infection, site not specified: Secondary | ICD-10-CM | POA: Insufficient documentation

## 2010-12-07 DIAGNOSIS — M549 Dorsalgia, unspecified: Secondary | ICD-10-CM | POA: Insufficient documentation

## 2010-12-07 DIAGNOSIS — K8020 Calculus of gallbladder without cholecystitis without obstruction: Secondary | ICD-10-CM | POA: Insufficient documentation

## 2010-12-07 DIAGNOSIS — E785 Hyperlipidemia, unspecified: Secondary | ICD-10-CM | POA: Insufficient documentation

## 2010-12-07 DIAGNOSIS — I252 Old myocardial infarction: Secondary | ICD-10-CM | POA: Insufficient documentation

## 2010-12-07 DIAGNOSIS — K449 Diaphragmatic hernia without obstruction or gangrene: Secondary | ICD-10-CM | POA: Insufficient documentation

## 2010-12-07 LAB — CBC
HCT: 41.2 % (ref 36.0–46.0)
Hemoglobin: 13.8 g/dL (ref 12.0–15.0)
MCH: 31.2 pg (ref 26.0–34.0)
MCHC: 33.5 g/dL (ref 30.0–36.0)
MCV: 93 fL (ref 78.0–100.0)
Platelets: 172 10*3/uL (ref 150–400)
RBC: 4.43 MIL/uL (ref 3.87–5.11)
RDW: 13.7 % (ref 11.5–15.5)
WBC: 5.5 10*3/uL (ref 4.0–10.5)

## 2010-12-07 LAB — DIFFERENTIAL
Basophils Absolute: 0 10*3/uL (ref 0.0–0.1)
Basophils Relative: 0 % (ref 0–1)
Eosinophils Absolute: 0.2 10*3/uL (ref 0.0–0.7)
Eosinophils Relative: 4 % (ref 0–5)
Lymphocytes Relative: 27 % (ref 12–46)
Lymphs Abs: 1.5 10*3/uL (ref 0.7–4.0)
Monocytes Absolute: 0.5 10*3/uL (ref 0.1–1.0)
Monocytes Relative: 9 % (ref 3–12)
Neutro Abs: 3.3 10*3/uL (ref 1.7–7.7)
Neutrophils Relative %: 60 % (ref 43–77)

## 2010-12-07 LAB — BASIC METABOLIC PANEL
BUN: 13 mg/dL (ref 6–23)
CO2: 24 mEq/L (ref 19–32)
Calcium: 9.7 mg/dL (ref 8.4–10.5)
Chloride: 106 mEq/L (ref 96–112)
Creatinine, Ser: 0.72 mg/dL (ref 0.50–1.10)
GFR calc Af Amer: >60 mL/min (ref >60)
GFR calc non Af Amer: >60 mL/min (ref >60)
Glucose, Bld: 140 mg/dL — ABNORMAL HIGH (ref 70–99)
Potassium: 3.6 mEq/L (ref 3.5–5.1)
Sodium: 140 mEq/L (ref 135–145)

## 2011-02-20 ENCOUNTER — Encounter: Payer: Self-pay | Admitting: Cardiology

## 2011-03-15 ENCOUNTER — Encounter: Payer: Self-pay | Admitting: Cardiology

## 2011-03-19 ENCOUNTER — Ambulatory Visit (INDEPENDENT_AMBULATORY_CARE_PROVIDER_SITE_OTHER): Payer: Medicare Other | Admitting: Cardiology

## 2011-03-19 ENCOUNTER — Encounter: Payer: Self-pay | Admitting: Cardiology

## 2011-03-19 VITALS — BP 150/90 | HR 63 | Resp 18 | Ht 65.0 in | Wt 178.4 lb

## 2011-03-19 DIAGNOSIS — E785 Hyperlipidemia, unspecified: Secondary | ICD-10-CM

## 2011-03-19 DIAGNOSIS — I2510 Atherosclerotic heart disease of native coronary artery without angina pectoris: Secondary | ICD-10-CM

## 2011-03-19 DIAGNOSIS — I10 Essential (primary) hypertension: Secondary | ICD-10-CM

## 2011-03-19 NOTE — Patient Instructions (Signed)
Your physician recommends that you schedule a follow-up appointment as needed.   Your physician recommends that you continue on your current medications as directed. Please refer to the Current Medication list given to you today.  

## 2011-03-21 NOTE — Assessment & Plan Note (Signed)
Followed by Dr. Valentina Lucks.  Tolerates atorvastatin

## 2011-03-21 NOTE — Assessment & Plan Note (Signed)
May still be some elevated.  Has room for adjustments with amlodipine.  Will defer to Dr. Valentina Lucks.

## 2011-03-21 NOTE — Progress Notes (Signed)
HPI:  Shelly Obrien is in for a follow up visit.  She has had prior PCI, and continued to do quite well. She is followed closely by Dr. Valentina Lucks.  She gets out on bus tours, mows her own lawn, but is not walking much other than this.  She remains stable at the present time.  She tolerates Lipitor pretty well.  Amlodipine has been added to her regimen to help with her BP.  She does have macular degeneration, wet type, per patient, in one eye.  She has also had a problem with her foot, yet remains ambulatory.   Current Outpatient Prescriptions  Medication Sig Dispense Refill  . amLODipine (NORVASC) 5 MG tablet Take 5 mg by mouth daily.        [name] aspirin 81 MG tablet Take 81 mg by mouth daily.        [name] atenolol (TENORMIN) 25 MG tablet Take 1/2 tablet daily       . atorvastatin (LIPITOR) 10 MG tablet Take 10 mg by mouth daily.        . Multiple Vitamins-Minerals (ICAPS) CAPS Take 1 capsule by mouth daily.        . nitroGLYCERIN (NITROSTAT) 0.4 MG SL tablet Place 0.4 mg under the tongue every 5 (five) minutes as needed.          Allergies  Allergen Reactions  . Penicillins   . Sulfonamide Derivatives     Past Medical History  Diagnosis Date  . Coronary artery disease   . Hypertension   . Hyperlipidemia   . Myocardial infarct     hx of  . H/O: hysterectomy   . Hx of lumpectomy     Past Surgical History  Procedure Date  . Angioplasty   . Knee surgery     right  . Carpal tunnel release     left    Family History  Problem Relation Age of Onset  . Coronary artery disease      positive family hx of  . Cancer      family hx of    History   Social History  . Marital Status: Widowed    Spouse Name: N/A    Number of Children: N/A  . Years of Education: N/A   Occupational History  . Not on file.   Social History Main Topics  . Smoking status: Never Smoker   . Smokeless tobacco: Not on file  . Alcohol Use: No  . Drug Use: No  . Sexually Active: Not on file   Other Topics  Concern  . Not on file   Social History Narrative  . No narrative on file    ROS: Please see the HPI.  All other systems reviewed and negative.  PHYSICAL EXAM:  BP 150/90  Pulse 63  Resp 18  Ht 5\' 5"  (1.651 m)  Wt 178 lb 6.4 oz (80.922 kg)  BMI 29.69 kg/m2  General: Well developed, well nourished, in no acute distress. Head:  Normocephalic and atraumatic. Neck: no JVD Lungs: Clear to auscultation and percussion. Heart: Normal S1 and S2.  No murmur, rubs or gallops.  Abdomen:  Normal bowel sounds; soft; non tender; no organomegaly Pulses: Pulses normal in all 4 extremities. Extremities: No clubbing or cyanosis. No edema. Neurologic: Alert and oriented x 3.  EKG:  NSR.  Left axis deviation.  ASSESSMENT AND PLAN:

## 2011-03-21 NOTE — Assessment & Plan Note (Signed)
Many years post PCI.  Has continued to do well.  I think we can follow her PRN at this point in time and defer sending her back to Dr. Valentina Lucks, who is nicely managing her issues.  We will be happy to see her at any time in the future.

## 2011-06-05 DIAGNOSIS — S42309A Unspecified fracture of shaft of humerus, unspecified arm, initial encounter for closed fracture: Secondary | ICD-10-CM

## 2011-06-05 HISTORY — DX: Unspecified fracture of shaft of humerus, unspecified arm, initial encounter for closed fracture: S42.309A

## 2011-06-08 DIAGNOSIS — D510 Vitamin B12 deficiency anemia due to intrinsic factor deficiency: Secondary | ICD-10-CM | POA: Diagnosis not present

## 2011-06-14 DIAGNOSIS — Z961 Presence of intraocular lens: Secondary | ICD-10-CM | POA: Diagnosis not present

## 2011-06-14 DIAGNOSIS — Z9849 Cataract extraction status, unspecified eye: Secondary | ICD-10-CM | POA: Diagnosis not present

## 2011-06-14 DIAGNOSIS — H353290 Exudative age-related macular degeneration, unspecified eye, stage unspecified: Secondary | ICD-10-CM | POA: Diagnosis not present

## 2011-06-14 DIAGNOSIS — H353190 Nonexudative age-related macular degeneration, unspecified eye, stage unspecified: Secondary | ICD-10-CM | POA: Diagnosis not present

## 2011-07-10 DIAGNOSIS — D510 Vitamin B12 deficiency anemia due to intrinsic factor deficiency: Secondary | ICD-10-CM | POA: Diagnosis not present

## 2011-07-26 DIAGNOSIS — H353190 Nonexudative age-related macular degeneration, unspecified eye, stage unspecified: Secondary | ICD-10-CM | POA: Diagnosis not present

## 2011-07-26 DIAGNOSIS — H353290 Exudative age-related macular degeneration, unspecified eye, stage unspecified: Secondary | ICD-10-CM | POA: Diagnosis not present

## 2011-07-26 DIAGNOSIS — Z961 Presence of intraocular lens: Secondary | ICD-10-CM | POA: Diagnosis not present

## 2011-07-26 DIAGNOSIS — Z9849 Cataract extraction status, unspecified eye: Secondary | ICD-10-CM | POA: Diagnosis not present

## 2011-08-13 DIAGNOSIS — IMO0001 Reserved for inherently not codable concepts without codable children: Secondary | ICD-10-CM | POA: Diagnosis not present

## 2011-08-13 DIAGNOSIS — I10 Essential (primary) hypertension: Secondary | ICD-10-CM | POA: Diagnosis not present

## 2011-09-06 DIAGNOSIS — H353290 Exudative age-related macular degeneration, unspecified eye, stage unspecified: Secondary | ICD-10-CM | POA: Diagnosis not present

## 2011-09-06 DIAGNOSIS — H353190 Nonexudative age-related macular degeneration, unspecified eye, stage unspecified: Secondary | ICD-10-CM | POA: Diagnosis not present

## 2011-09-06 DIAGNOSIS — Z9849 Cataract extraction status, unspecified eye: Secondary | ICD-10-CM | POA: Diagnosis not present

## 2011-09-06 DIAGNOSIS — Z961 Presence of intraocular lens: Secondary | ICD-10-CM | POA: Diagnosis not present

## 2011-10-15 DIAGNOSIS — I10 Essential (primary) hypertension: Secondary | ICD-10-CM | POA: Diagnosis not present

## 2011-10-15 DIAGNOSIS — E785 Hyperlipidemia, unspecified: Secondary | ICD-10-CM | POA: Diagnosis not present

## 2011-10-15 DIAGNOSIS — R351 Nocturia: Secondary | ICD-10-CM | POA: Diagnosis not present

## 2011-10-15 DIAGNOSIS — IMO0001 Reserved for inherently not codable concepts without codable children: Secondary | ICD-10-CM | POA: Diagnosis not present

## 2011-10-18 DIAGNOSIS — Z961 Presence of intraocular lens: Secondary | ICD-10-CM | POA: Diagnosis not present

## 2011-10-18 DIAGNOSIS — H353290 Exudative age-related macular degeneration, unspecified eye, stage unspecified: Secondary | ICD-10-CM | POA: Diagnosis not present

## 2011-10-18 DIAGNOSIS — H353190 Nonexudative age-related macular degeneration, unspecified eye, stage unspecified: Secondary | ICD-10-CM | POA: Diagnosis not present

## 2011-10-18 DIAGNOSIS — Z9849 Cataract extraction status, unspecified eye: Secondary | ICD-10-CM | POA: Diagnosis not present

## 2011-10-24 DIAGNOSIS — Z1231 Encounter for screening mammogram for malignant neoplasm of breast: Secondary | ICD-10-CM | POA: Diagnosis not present

## 2011-10-24 DIAGNOSIS — N6489 Other specified disorders of breast: Secondary | ICD-10-CM | POA: Diagnosis not present

## 2011-10-25 DIAGNOSIS — N6489 Other specified disorders of breast: Secondary | ICD-10-CM | POA: Diagnosis not present

## 2011-10-25 DIAGNOSIS — Z1231 Encounter for screening mammogram for malignant neoplasm of breast: Secondary | ICD-10-CM | POA: Diagnosis not present

## 2011-11-22 DIAGNOSIS — Z9849 Cataract extraction status, unspecified eye: Secondary | ICD-10-CM | POA: Diagnosis not present

## 2011-11-22 DIAGNOSIS — H353190 Nonexudative age-related macular degeneration, unspecified eye, stage unspecified: Secondary | ICD-10-CM | POA: Diagnosis not present

## 2011-11-22 DIAGNOSIS — H353290 Exudative age-related macular degeneration, unspecified eye, stage unspecified: Secondary | ICD-10-CM | POA: Diagnosis not present

## 2011-11-22 DIAGNOSIS — Z961 Presence of intraocular lens: Secondary | ICD-10-CM | POA: Diagnosis not present

## 2011-11-27 DIAGNOSIS — M1710 Unilateral primary osteoarthritis, unspecified knee: Secondary | ICD-10-CM | POA: Diagnosis not present

## 2011-11-27 DIAGNOSIS — IMO0002 Reserved for concepts with insufficient information to code with codable children: Secondary | ICD-10-CM | POA: Diagnosis not present

## 2011-11-27 DIAGNOSIS — I10 Essential (primary) hypertension: Secondary | ICD-10-CM | POA: Diagnosis not present

## 2011-12-27 DIAGNOSIS — Z961 Presence of intraocular lens: Secondary | ICD-10-CM | POA: Diagnosis not present

## 2011-12-27 DIAGNOSIS — H353290 Exudative age-related macular degeneration, unspecified eye, stage unspecified: Secondary | ICD-10-CM | POA: Diagnosis not present

## 2011-12-27 DIAGNOSIS — Z9849 Cataract extraction status, unspecified eye: Secondary | ICD-10-CM | POA: Diagnosis not present

## 2011-12-27 DIAGNOSIS — H353190 Nonexudative age-related macular degeneration, unspecified eye, stage unspecified: Secondary | ICD-10-CM | POA: Diagnosis not present

## 2012-01-03 ENCOUNTER — Other Ambulatory Visit: Payer: Self-pay | Admitting: Dermatology

## 2012-01-03 DIAGNOSIS — C44319 Basal cell carcinoma of skin of other parts of face: Secondary | ICD-10-CM | POA: Diagnosis not present

## 2012-01-03 DIAGNOSIS — L570 Actinic keratosis: Secondary | ICD-10-CM | POA: Diagnosis not present

## 2012-02-02 ENCOUNTER — Encounter (HOSPITAL_COMMUNITY): Payer: Self-pay | Admitting: Emergency Medicine

## 2012-02-02 ENCOUNTER — Emergency Department (INDEPENDENT_AMBULATORY_CARE_PROVIDER_SITE_OTHER): Payer: Medicare Other

## 2012-02-02 ENCOUNTER — Emergency Department (INDEPENDENT_AMBULATORY_CARE_PROVIDER_SITE_OTHER)
Admission: EM | Admit: 2012-02-02 | Discharge: 2012-02-02 | Disposition: A | Discharge status: Home / Self Care | Payer: Medicare Other | Source: Home / Self Care

## 2012-02-02 DIAGNOSIS — M25839 Other specified joint disorders, unspecified wrist: Secondary | ICD-10-CM | POA: Diagnosis not present

## 2012-02-02 DIAGNOSIS — S62101A Fracture of unspecified carpal bone, right wrist, initial encounter for closed fracture: Secondary | ICD-10-CM

## 2012-02-02 DIAGNOSIS — M25849 Other specified joint disorders, unspecified hand: Secondary | ICD-10-CM | POA: Diagnosis not present

## 2012-02-02 DIAGNOSIS — S52599A Other fractures of lower end of unspecified radius, initial encounter for closed fracture: Secondary | ICD-10-CM | POA: Diagnosis not present

## 2012-02-02 DIAGNOSIS — S62109A Fracture of unspecified carpal bone, unspecified wrist, initial encounter for closed fracture: Secondary | ICD-10-CM | POA: Diagnosis not present

## 2012-02-02 NOTE — ED Notes (Signed)
Numbness to left side of face and left thumb episodic , has had 4 episodes today. Symptoms started weds.

## 2012-02-02 NOTE — Progress Notes (Signed)
Orthopedic Tech Progress Note Patient Details:  Shelly Obrien 1922/09/10 [id number] Sugartong splint appleid to Right UE with instruction; Arm sling applied to Right UE with instruction Ortho Devices Type of Ortho Device: Arm foam sling;Sugartong splint Ortho Device/Splint Location: Sugartong splint and arm sling applied to Rigth UE Ortho Device/Splint Interventions: Application   Asia R Thompson 02/02/2012, 7:20 PM

## 2012-02-02 NOTE — ED Provider Notes (Signed)
History     CSN: [id number]  Arrival date & time 02/02/12  1547   First MD Initiated Contact with Patient 02/02/12 1728      Chief Complaint  Patient presents with  . Arm Pain    fell on arm    (Consider location/radiation/quality/duration/timing/severity/associated sxs/prior treatment) HPI Comments: This lady was cleaning and fell onto her R hand/wrist and hyperextended the wrist. This is her only injury. Denies problems with head, neck , back, chest or other areas.  There is pain , swelling and minor deformity of the R wrist.   Patient is a 77 y.o. female presenting with wrist pain. The history is provided by the patient.  Wrist Pain This is a new problem. The current episode started 3 to 5 hours ago. The problem occurs constantly. The problem has been gradually worsening. Pertinent negatives include no chest pain, no abdominal pain, no headaches and no shortness of breath. The symptoms are aggravated by twisting. Nothing relieves the symptoms. She has tried nothing for the symptoms.    Past Medical History  Diagnosis Date  . Coronary artery disease   . Hypertension   . Hyperlipidemia   . Myocardial infarct     hx of  . H/O: hysterectomy   . Hx of lumpectomy     Past Surgical History  Procedure Date  . Angioplasty   . Knee surgery     right  . Carpal tunnel release     left  . Cardiac surgery     Family History  Problem Relation Age of Onset  . Coronary artery disease      positive family hx of  . Cancer      family hx of    History  Substance Use Topics  . Smoking status: Never Smoker   . Smokeless tobacco: Not on file  . Alcohol Use: No    OB History    Grav Para Term Preterm Abortions TAB SAB Ect Mult Living                  Review of Systems  Constitutional: Negative.  Negative for activity change.  HENT: Negative for neck pain and neck stiffness.   Respiratory: Negative for apnea, cough and shortness of breath.   Cardiovascular: Negative  for chest pain and leg swelling.  Gastrointestinal: Negative for abdominal pain.  Genitourinary: Negative for dysuria and difficulty urinating.  Musculoskeletal: Positive for joint swelling.  Skin: Negative for color change, pallor and rash.  Neurological: Negative for dizziness, syncope, weakness, numbness and headaches.    Allergies  Penicillins and Sulfonamide derivatives  Home Medications   Current Outpatient Rx  Name Route Sig Dispense Refill  . ASPIRIN 81 MG PO TABS Oral Take 81 mg by mouth daily.      . ATENOLOL 25 MG PO TABS  Take 1/2 tablet daily     . ATORVASTATIN CALCIUM 10 MG PO TABS Oral Take 10 mg by mouth daily.      [name] NITROGLYCERIN 0.4 MG SL SUBL Sublingual Place 0.4 mg under the tongue every 5 (five) minutes as needed.      [name] AMLODIPINE BESYLATE 5 MG PO TABS Oral Take 5 mg by mouth daily.      . ICAPS PO CAPS Oral Take 1 capsule by mouth daily.        BP 151/84  Pulse 72  Temp 98.2 F (36.8 C) (Oral)  Resp 16  SpO2 98%  Physical Exam  Constitutional: She is oriented  to person, place, and time. She appears well-developed and well-nourished.  HENT:  Head: Atraumatic.  Neck: Normal range of motion. Neck supple.  Musculoskeletal: She exhibits tenderness.       Unable to provide ROM due to pain, distal N/V, Sensation intact. Pulse 2+, nl color  Neurological: She is alert and oriented to person, place, and time.  Skin: Skin is warm and dry.  Psychiatric: She has a normal mood and affect.    ED Course  Procedures (including critical care time)  Labs Reviewed - No data to display No results found.   No diagnosis found.    MDM  Consulted with Dr. Orlan Leavens who also reviewed the films. St OK to splint and see in office 3 days. Keep elevated, wear sling. She repeatedly denied anything for pain.         Hayden Rasmussen, NP 02/02/12 2154

## 2012-02-02 NOTE — ED Notes (Signed)
Pt states that she tripped over an object in her garage and fell. Tried breaking fall and landed on right arm bending it backwards.

## 2012-02-05 DIAGNOSIS — S52599A Other fractures of lower end of unspecified radius, initial encounter for closed fracture: Secondary | ICD-10-CM | POA: Diagnosis not present

## 2012-02-07 DIAGNOSIS — H353290 Exudative age-related macular degeneration, unspecified eye, stage unspecified: Secondary | ICD-10-CM | POA: Diagnosis not present

## 2012-02-07 DIAGNOSIS — H353190 Nonexudative age-related macular degeneration, unspecified eye, stage unspecified: Secondary | ICD-10-CM | POA: Diagnosis not present

## 2012-02-07 DIAGNOSIS — C4491 Basal cell carcinoma of skin, unspecified: Secondary | ICD-10-CM | POA: Diagnosis not present

## 2012-02-07 DIAGNOSIS — Z9849 Cataract extraction status, unspecified eye: Secondary | ICD-10-CM | POA: Diagnosis not present

## 2012-02-07 DIAGNOSIS — Z961 Presence of intraocular lens: Secondary | ICD-10-CM | POA: Diagnosis not present

## 2012-02-07 NOTE — ED Provider Notes (Signed)
Dx : R wrist fx  Medical screening examination/treatment/procedure(s) were performed by non-physician practitioner and as supervising physician I was immediately available for consultation/collaboration.  Luiz Blare MD   Luiz Blare, MD 02/07/12 2107

## 2012-02-13 DIAGNOSIS — D510 Vitamin B12 deficiency anemia due to intrinsic factor deficiency: Secondary | ICD-10-CM | POA: Diagnosis not present

## 2012-02-13 DIAGNOSIS — E785 Hyperlipidemia, unspecified: Secondary | ICD-10-CM | POA: Diagnosis not present

## 2012-02-13 DIAGNOSIS — I2510 Atherosclerotic heart disease of native coronary artery without angina pectoris: Secondary | ICD-10-CM | POA: Diagnosis not present

## 2012-02-13 DIAGNOSIS — I10 Essential (primary) hypertension: Secondary | ICD-10-CM | POA: Diagnosis not present

## 2012-02-18 DIAGNOSIS — C44319 Basal cell carcinoma of skin of other parts of face: Secondary | ICD-10-CM | POA: Diagnosis not present

## 2012-02-28 ENCOUNTER — Encounter: Payer: Self-pay | Admitting: Cardiology

## 2012-02-29 DIAGNOSIS — S52599A Other fractures of lower end of unspecified radius, initial encounter for closed fracture: Secondary | ICD-10-CM | POA: Diagnosis not present

## 2012-03-13 DIAGNOSIS — M25529 Pain in unspecified elbow: Secondary | ICD-10-CM | POA: Diagnosis not present

## 2012-03-13 DIAGNOSIS — S52599A Other fractures of lower end of unspecified radius, initial encounter for closed fracture: Secondary | ICD-10-CM | POA: Diagnosis not present

## 2012-03-17 DIAGNOSIS — S52599A Other fractures of lower end of unspecified radius, initial encounter for closed fracture: Secondary | ICD-10-CM | POA: Diagnosis not present

## 2012-03-20 DIAGNOSIS — S52599A Other fractures of lower end of unspecified radius, initial encounter for closed fracture: Secondary | ICD-10-CM | POA: Diagnosis not present

## 2012-03-25 DIAGNOSIS — S52599A Other fractures of lower end of unspecified radius, initial encounter for closed fracture: Secondary | ICD-10-CM | POA: Diagnosis not present

## 2012-03-27 DIAGNOSIS — H353290 Exudative age-related macular degeneration, unspecified eye, stage unspecified: Secondary | ICD-10-CM | POA: Diagnosis not present

## 2012-03-27 DIAGNOSIS — H353190 Nonexudative age-related macular degeneration, unspecified eye, stage unspecified: Secondary | ICD-10-CM | POA: Diagnosis not present

## 2012-03-28 DIAGNOSIS — S52599A Other fractures of lower end of unspecified radius, initial encounter for closed fracture: Secondary | ICD-10-CM | POA: Diagnosis not present

## 2012-03-31 DIAGNOSIS — G5600 Carpal tunnel syndrome, unspecified upper limb: Secondary | ICD-10-CM | POA: Diagnosis not present

## 2012-03-31 DIAGNOSIS — S52599A Other fractures of lower end of unspecified radius, initial encounter for closed fracture: Secondary | ICD-10-CM | POA: Diagnosis not present

## 2012-04-03 DIAGNOSIS — S52599A Other fractures of lower end of unspecified radius, initial encounter for closed fracture: Secondary | ICD-10-CM | POA: Diagnosis not present

## 2012-04-09 DIAGNOSIS — S52599A Other fractures of lower end of unspecified radius, initial encounter for closed fracture: Secondary | ICD-10-CM | POA: Diagnosis not present

## 2012-04-11 DIAGNOSIS — S52599A Other fractures of lower end of unspecified radius, initial encounter for closed fracture: Secondary | ICD-10-CM | POA: Diagnosis not present

## 2012-04-15 DIAGNOSIS — Z23 Encounter for immunization: Secondary | ICD-10-CM | POA: Diagnosis not present

## 2012-04-16 DIAGNOSIS — S52599A Other fractures of lower end of unspecified radius, initial encounter for closed fracture: Secondary | ICD-10-CM | POA: Diagnosis not present

## 2012-04-18 DIAGNOSIS — S52599A Other fractures of lower end of unspecified radius, initial encounter for closed fracture: Secondary | ICD-10-CM | POA: Diagnosis not present

## 2012-04-21 DIAGNOSIS — S52599A Other fractures of lower end of unspecified radius, initial encounter for closed fracture: Secondary | ICD-10-CM | POA: Diagnosis not present

## 2012-04-22 DIAGNOSIS — S52599A Other fractures of lower end of unspecified radius, initial encounter for closed fracture: Secondary | ICD-10-CM | POA: Diagnosis not present

## 2012-04-28 DIAGNOSIS — S52599A Other fractures of lower end of unspecified radius, initial encounter for closed fracture: Secondary | ICD-10-CM | POA: Diagnosis not present

## 2012-05-07 DIAGNOSIS — S52599A Other fractures of lower end of unspecified radius, initial encounter for closed fracture: Secondary | ICD-10-CM | POA: Diagnosis not present

## 2012-05-08 DIAGNOSIS — H353290 Exudative age-related macular degeneration, unspecified eye, stage unspecified: Secondary | ICD-10-CM | POA: Diagnosis not present

## 2012-05-08 DIAGNOSIS — H353190 Nonexudative age-related macular degeneration, unspecified eye, stage unspecified: Secondary | ICD-10-CM | POA: Diagnosis not present

## 2012-05-13 DIAGNOSIS — S52599A Other fractures of lower end of unspecified radius, initial encounter for closed fracture: Secondary | ICD-10-CM | POA: Diagnosis not present

## 2012-05-20 DIAGNOSIS — G5600 Carpal tunnel syndrome, unspecified upper limb: Secondary | ICD-10-CM | POA: Diagnosis not present

## 2012-06-17 DIAGNOSIS — S5290XD Unspecified fracture of unspecified forearm, subsequent encounter for closed fracture with routine healing: Secondary | ICD-10-CM | POA: Diagnosis not present

## 2012-06-17 DIAGNOSIS — IMO0001 Reserved for inherently not codable concepts without codable children: Secondary | ICD-10-CM | POA: Diagnosis not present

## 2012-06-19 DIAGNOSIS — H353190 Nonexudative age-related macular degeneration, unspecified eye, stage unspecified: Secondary | ICD-10-CM | POA: Diagnosis not present

## 2012-06-19 DIAGNOSIS — H353290 Exudative age-related macular degeneration, unspecified eye, stage unspecified: Secondary | ICD-10-CM | POA: Diagnosis not present

## 2012-07-30 DIAGNOSIS — H353190 Nonexudative age-related macular degeneration, unspecified eye, stage unspecified: Secondary | ICD-10-CM | POA: Diagnosis not present

## 2012-07-31 DIAGNOSIS — H353290 Exudative age-related macular degeneration, unspecified eye, stage unspecified: Secondary | ICD-10-CM | POA: Diagnosis not present

## 2012-08-01 ENCOUNTER — Emergency Department (HOSPITAL_COMMUNITY)
Admission: EM | Admit: 2012-08-01 | Discharge: 2012-08-01 | Disposition: A | Discharge status: Home / Self Care | Payer: Medicare Other | Attending: Emergency Medicine | Admitting: Emergency Medicine

## 2012-08-01 ENCOUNTER — Encounter (HOSPITAL_COMMUNITY): Payer: Self-pay | Admitting: Emergency Medicine

## 2012-08-01 DIAGNOSIS — Z90710 Acquired absence of both cervix and uterus: Secondary | ICD-10-CM | POA: Insufficient documentation

## 2012-08-01 DIAGNOSIS — Z79899 Other long term (current) drug therapy: Secondary | ICD-10-CM | POA: Insufficient documentation

## 2012-08-01 DIAGNOSIS — E785 Hyperlipidemia, unspecified: Secondary | ICD-10-CM | POA: Diagnosis not present

## 2012-08-01 DIAGNOSIS — I2510 Atherosclerotic heart disease of native coronary artery without angina pectoris: Secondary | ICD-10-CM | POA: Diagnosis not present

## 2012-08-01 DIAGNOSIS — M79609 Pain in unspecified limb: Secondary | ICD-10-CM | POA: Diagnosis not present

## 2012-08-01 DIAGNOSIS — Z7982 Long term (current) use of aspirin: Secondary | ICD-10-CM | POA: Diagnosis not present

## 2012-08-01 DIAGNOSIS — G8921 Chronic pain due to trauma: Secondary | ICD-10-CM | POA: Insufficient documentation

## 2012-08-01 DIAGNOSIS — I10 Essential (primary) hypertension: Secondary | ICD-10-CM | POA: Insufficient documentation

## 2012-08-01 DIAGNOSIS — I252 Old myocardial infarction: Secondary | ICD-10-CM | POA: Insufficient documentation

## 2012-08-01 DIAGNOSIS — Z9889 Other specified postprocedural states: Secondary | ICD-10-CM | POA: Diagnosis not present

## 2012-08-01 DIAGNOSIS — I160 Hypertensive urgency: Secondary | ICD-10-CM

## 2012-08-01 LAB — CBC WITH DIFFERENTIAL/PLATELET
Basophils Absolute: 0 10*3/uL (ref 0.0–0.1)
Basophils Relative: 0 % (ref 0–1)
Eosinophils Absolute: 0.1 10*3/uL (ref 0.0–0.7)
Eosinophils Relative: 2 % (ref 0–5)
HCT: 44.8 % (ref 36.0–46.0)
Hemoglobin: 15.3 g/dL — ABNORMAL HIGH (ref 12.0–15.0)
Lymphocytes Relative: 26 % (ref 12–46)
Lymphs Abs: 1.6 10*3/uL (ref 0.7–4.0)
MCH: 32.3 pg (ref 26.0–34.0)
MCHC: 34.2 g/dL (ref 30.0–36.0)
MCV: 94.5 fL (ref 78.0–100.0)
Monocytes Absolute: 0.4 10*3/uL (ref 0.1–1.0)
Monocytes Relative: 7 % (ref 3–12)
Neutro Abs: 4 10*3/uL (ref 1.7–7.7)
Neutrophils Relative %: 65 % (ref 43–77)
Platelets: 180 10*3/uL (ref 150–400)
RBC: 4.74 MIL/uL (ref 3.87–5.11)
RDW: 13.7 % (ref 11.5–15.5)
WBC: 6.2 10*3/uL (ref 4.0–10.5)

## 2012-08-01 LAB — BASIC METABOLIC PANEL
BUN: 12 mg/dL (ref 6–23)
CO2: 29 mEq/L (ref 19–32)
Calcium: 9.9 mg/dL (ref 8.4–10.5)
Chloride: 105 mEq/L (ref 96–112)
Creatinine, Ser: 0.76 mg/dL (ref 0.50–1.10)
GFR calc Af Amer: 85 mL/min — ABNORMAL LOW (ref >90)
GFR calc non Af Amer: 73 mL/min — ABNORMAL LOW (ref >90)
Glucose, Bld: 123 mg/dL — ABNORMAL HIGH (ref 70–99)
Potassium: 3.9 mEq/L (ref 3.5–5.1)
Sodium: 143 mEq/L (ref 135–145)

## 2012-08-01 LAB — URINALYSIS, ROUTINE W REFLEX MICROSCOPIC
Bilirubin Urine: NEGATIVE
Glucose, UA: NEGATIVE mg/dL
Hgb urine dipstick: NEGATIVE
Ketones, ur: NEGATIVE mg/dL
Leukocytes, UA: NEGATIVE
Nitrite: NEGATIVE
Protein, ur: NEGATIVE mg/dL
Specific Gravity, Urine: 1.013 (ref 1.005–1.030)
Urobilinogen, UA: 1 mg/dL (ref 0.0–1.0)
pH: 7.5 (ref 5.0–8.0)

## 2012-08-01 NOTE — ED Notes (Signed)
Pt c/o hypertension x several days; pt sts pain down bilateral arms and one episode of dizziness

## 2012-08-01 NOTE — ED Provider Notes (Signed)
History     CSN: [id number]  Arrival date & time 08/01/12  1124   First MD Initiated Contact with Patient 08/01/12 1222      Chief Complaint  Patient presents with  . Hypertension    (Consider location/radiation/quality/duration/timing/severity/associated sxs/prior treatment) HPI The patient presents with concern of hypertension.  She denies focal new pain, contrary to nursing note.  She states the past 3 days she's had elevated blood pressure readings, without focal new chest pain, dyspnea, headache, falls, ataxia.  She has chronic right upper extremity pain since a fracture last year.  She notes that this is unchanged.  She denies any nausea, vomiting, diarrhea. Several days ago she had one episode of dizziness, for which she took over-the-counter medication.  She states that her numeric abnormalities began after that episode. She also recently had a ophthalmologic procedure.  She denies new eye complaints or concerns.  Past Medical History  Diagnosis Date  . Coronary artery disease   . Hypertension   . Hyperlipidemia   . Myocardial infarct     hx of  . H/O: hysterectomy   . Hx of lumpectomy     Past Surgical History  Procedure Laterality Date  . Angioplasty    . Knee surgery      right  . Carpal tunnel release      left  . Cardiac surgery      Family History  Problem Relation Age of Onset  . Coronary artery disease      positive family hx of  . Cancer      family hx of    History  Substance Use Topics  . Smoking status: Never Smoker   . Smokeless tobacco: Not on file  . Alcohol Use: No    OB History   Grav Para Term Preterm Abortions TAB SAB Ect Mult Living                  Review of Systems  Constitutional:       Per HPI, otherwise negative  HENT:       Per HPI, otherwise negative  Respiratory:       Per HPI, otherwise negative  Cardiovascular:       Per HPI, otherwise negative  Gastrointestinal: Negative for vomiting.  Endocrine:   Negative aside from HPI  Genitourinary:       Neg aside from HPI   Musculoskeletal:       Per HPI, otherwise negative  Skin: Negative.   Neurological: Negative for syncope.    Allergies  Penicillins and Sulfonamide derivatives  Home Medications   Current Outpatient Rx  Name  Route  Sig  Dispense  Refill  . amLODipine (NORVASC) 5 MG tablet   Oral   Take 5 mg by mouth daily.           [name] aspirin 81 MG tablet   Oral   Take 81 mg by mouth daily.           [name] atenolol (TENORMIN) 25 MG tablet   Oral   Take 12.5 mg by mouth daily. Take 1/2 tablet daily         . atorvastatin (LIPITOR) 10 MG tablet   Oral   Take 10 mg by mouth daily.           . Multiple Vitamins-Minerals (ICAPS) CAPS   Oral   Take 1 capsule by mouth daily.           . nitroGLYCERIN (NITROSTAT) 0.4  MG SL tablet   Sublingual   Place 0.4 mg under the tongue every 5 (five) minutes as needed.             BP 175/103  Pulse 91  Temp(Src) 96.8 F (36 C) (Oral)  Resp 18  SpO2 97%  Physical Exam  Nursing note and vitals reviewed. Constitutional: She is oriented to person, place, and time. She appears well-developed and well-nourished. No distress.  HENT:  Head: Normocephalic and atraumatic.  Eyes: Conjunctivae and EOM are normal.  Cardiovascular: Normal rate and regular rhythm.   Pulmonary/Chest: Effort normal and breath sounds normal. No stridor. No respiratory distress.  Abdominal: She exhibits no distension.  Musculoskeletal: She exhibits no edema.  Right wrist is slightly deformed, though the patient states this is normal.  No superficial changes  Neurological: She is alert and oriented to person, place, and time. No cranial nerve deficit.  Skin: Skin is warm and dry.  Psychiatric: She has a normal mood and affect.    ED Course  Procedures (including critical care time)  Labs Reviewed  BASIC METABOLIC PANEL  CBC WITH DIFFERENTIAL  URINALYSIS, ROUTINE W REFLEX MICROSCOPIC   No  results found.   No diagnosis found.  Pulse ox 99% room air normal Cardiac 85 sinus rhythm normal Blood pressure is 150/90   Date: 08/01/2012  Rate: 86  Rhythm: normal sinus rhythm  QRS Axis: left  Intervals: normal  ST/T Wave abnormalities: nonspecific T wave changes  Conduction Disutrbances:left anterior fascicular block  Narrative Interpretation:   Old EKG Reviewed: none available ABNORMAL  Patient's primary care physician is Valentina Lucks  MDM  This very pleasant patient presents with concern of ongoing hypertension, with minimal ongoing complaints beyond that.  Notably, the patient no new chest pain, dyspnea, headache, confusion, disorientation, falls or other overtly concerning signs of ongoing coronary disease, nor cerebrovascular disease.  Given the ongoing hypertension, the patient had labs, urinalysis to evaluate for hypertensive urgency, and organ effects.  Absent concerning findings, the patient was discharged to follow up with her primary care physician with consideration of medication reconciliation promptly.          Gerhard Munch, MD 08/02/12 [id number]

## 2012-08-13 DIAGNOSIS — Z1331 Encounter for screening for depression: Secondary | ICD-10-CM | POA: Diagnosis not present

## 2012-08-13 DIAGNOSIS — R7301 Impaired fasting glucose: Secondary | ICD-10-CM | POA: Diagnosis not present

## 2012-08-13 DIAGNOSIS — I10 Essential (primary) hypertension: Secondary | ICD-10-CM | POA: Diagnosis not present

## 2012-08-13 DIAGNOSIS — Z0000 Encounter for general adult medical examination without abnormal findings: Secondary | ICD-10-CM | POA: Diagnosis not present

## 2012-08-13 DIAGNOSIS — E538 Deficiency of other specified B group vitamins: Secondary | ICD-10-CM | POA: Diagnosis not present

## 2012-08-13 DIAGNOSIS — E785 Hyperlipidemia, unspecified: Secondary | ICD-10-CM | POA: Diagnosis not present

## 2012-09-09 DIAGNOSIS — M7150 Other bursitis, not elsewhere classified, unspecified site: Secondary | ICD-10-CM | POA: Diagnosis not present

## 2012-09-09 DIAGNOSIS — IMO0002 Reserved for concepts with insufficient information to code with codable children: Secondary | ICD-10-CM | POA: Diagnosis not present

## 2012-09-09 DIAGNOSIS — M7660 Achilles tendinitis, unspecified leg: Secondary | ICD-10-CM | POA: Diagnosis not present

## 2012-09-09 DIAGNOSIS — M7730 Calcaneal spur, unspecified foot: Secondary | ICD-10-CM | POA: Diagnosis not present

## 2012-09-09 DIAGNOSIS — M79609 Pain in unspecified limb: Secondary | ICD-10-CM | POA: Diagnosis not present

## 2012-09-11 DIAGNOSIS — Z961 Presence of intraocular lens: Secondary | ICD-10-CM | POA: Diagnosis not present

## 2012-09-11 DIAGNOSIS — H353190 Nonexudative age-related macular degeneration, unspecified eye, stage unspecified: Secondary | ICD-10-CM | POA: Diagnosis not present

## 2012-09-11 DIAGNOSIS — H353290 Exudative age-related macular degeneration, unspecified eye, stage unspecified: Secondary | ICD-10-CM | POA: Diagnosis not present

## 2012-09-23 DIAGNOSIS — L0291 Cutaneous abscess, unspecified: Secondary | ICD-10-CM | POA: Diagnosis not present

## 2012-09-23 DIAGNOSIS — L0390 Cellulitis, unspecified: Secondary | ICD-10-CM | POA: Diagnosis not present

## 2012-10-21 DIAGNOSIS — IMO0002 Reserved for concepts with insufficient information to code with codable children: Secondary | ICD-10-CM | POA: Diagnosis not present

## 2012-10-21 DIAGNOSIS — M7660 Achilles tendinitis, unspecified leg: Secondary | ICD-10-CM | POA: Diagnosis not present

## 2012-10-21 DIAGNOSIS — M7150 Other bursitis, not elsewhere classified, unspecified site: Secondary | ICD-10-CM | POA: Diagnosis not present

## 2012-10-30 DIAGNOSIS — H353290 Exudative age-related macular degeneration, unspecified eye, stage unspecified: Secondary | ICD-10-CM | POA: Diagnosis not present

## 2012-11-04 DIAGNOSIS — Z1231 Encounter for screening mammogram for malignant neoplasm of breast: Secondary | ICD-10-CM | POA: Diagnosis not present

## 2012-11-10 DIAGNOSIS — M65849 Other synovitis and tenosynovitis, unspecified hand: Secondary | ICD-10-CM | POA: Diagnosis not present

## 2012-11-10 DIAGNOSIS — M25649 Stiffness of unspecified hand, not elsewhere classified: Secondary | ICD-10-CM | POA: Diagnosis not present

## 2012-11-10 DIAGNOSIS — M65839 Other synovitis and tenosynovitis, unspecified forearm: Secondary | ICD-10-CM | POA: Diagnosis not present

## 2012-11-11 DIAGNOSIS — M659 Synovitis and tenosynovitis, unspecified: Secondary | ICD-10-CM | POA: Diagnosis not present

## 2012-11-17 DIAGNOSIS — R300 Dysuria: Secondary | ICD-10-CM | POA: Diagnosis not present

## 2012-11-17 DIAGNOSIS — R29898 Other symptoms and signs involving the musculoskeletal system: Secondary | ICD-10-CM | POA: Diagnosis not present

## 2012-11-19 DIAGNOSIS — IMO0002 Reserved for concepts with insufficient information to code with codable children: Secondary | ICD-10-CM | POA: Diagnosis not present

## 2012-11-19 DIAGNOSIS — M259 Joint disorder, unspecified: Secondary | ICD-10-CM | POA: Diagnosis not present

## 2012-11-19 DIAGNOSIS — G5600 Carpal tunnel syndrome, unspecified upper limb: Secondary | ICD-10-CM | POA: Diagnosis not present

## 2012-12-09 DIAGNOSIS — R42 Dizziness and giddiness: Secondary | ICD-10-CM | POA: Diagnosis not present

## 2012-12-19 DIAGNOSIS — H8190 Unspecified disorder of vestibular function, unspecified ear: Secondary | ICD-10-CM | POA: Diagnosis not present

## 2012-12-19 DIAGNOSIS — H6120 Impacted cerumen, unspecified ear: Secondary | ICD-10-CM | POA: Diagnosis not present

## 2012-12-19 DIAGNOSIS — H9209 Otalgia, unspecified ear: Secondary | ICD-10-CM | POA: Diagnosis not present

## 2012-12-23 DIAGNOSIS — R42 Dizziness and giddiness: Secondary | ICD-10-CM | POA: Diagnosis not present

## 2013-01-02 ENCOUNTER — Ambulatory Visit (INDEPENDENT_AMBULATORY_CARE_PROVIDER_SITE_OTHER): Payer: Medicare Other | Admitting: Cardiology

## 2013-01-02 ENCOUNTER — Encounter: Payer: Self-pay | Admitting: Cardiology

## 2013-01-02 VITALS — BP 147/95 | HR 83 | Ht 65.0 in | Wt 175.4 lb

## 2013-01-02 DIAGNOSIS — I2510 Atherosclerotic heart disease of native coronary artery without angina pectoris: Secondary | ICD-10-CM | POA: Diagnosis not present

## 2013-01-02 DIAGNOSIS — I10 Essential (primary) hypertension: Secondary | ICD-10-CM

## 2013-01-02 DIAGNOSIS — R42 Dizziness and giddiness: Secondary | ICD-10-CM | POA: Diagnosis not present

## 2013-01-02 MED ORDER — AMLODIPINE BESYLATE 5 MG PO TABS: ORAL_TABLET | ORAL | Status: DC

## 2013-01-02 NOTE — Progress Notes (Addendum)
ELECTROPHYSIOLOGY OFFICE NOTE  Patient ID: Shelly Obrien MRN: [id number], DOB/AGE: [birth year]   Date of Visit: 01/02/2013  Primary Physician: Lillia Mountain, MD Primary Cardiologist: previously Dr. Riley Kill Reason for Visit: Dizziness  History of Present Illness  Shelly Obrien is a 79 y.o. female with known CAD s/p remote PCI to LAD and HTN who presents today to for follow-up regarding CAD and for evaluation of dizziness. She was last seen by Dr. Riley Kill in Oct 2012 at which time she was doing well from a cardiac standpoint, stable and discharged to follow-up on an as needed basis.   Shelly Obrien reports having intermittent dizziness for years. She has a known diagnosis of vertigo and has been evaluated by ENT. Initially her symptoms occurred only with lying down and she described the room spinning around her rapidly. Over the years she has had some dizziness occur while standing or in seated position. Again with the sensation that things are spinning around her. She states she feels unsteady on her feet like she is walking crooked. She usually has to hold onto something until her symptoms pass. She states a typical episode lasts 1-2 minutes. She takes an OTC "anti dizzy" pill (as directed by ENT) which always helps her symptoms. She doesn't feel her episodes are increasing in frequency or changing.   Otherwise she reports she is doing well and has no cardiac complaints. She denies chest pain or shortness of breath. She denies palpitations, near syncope or syncope. She denies LE swelling, orthopnea, PND or recent weight gain. She is compliant and tolerating medications without difficulty. She remains quite active and states, "I'm always on the go." She denies any difficulty or limitation performing her usual activities. 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 She also shops frequently and mows her own lawn. [name], she mentions that her BP has been running a little high at home,  usually 140/90 by her home monitor.  Past Medical History Past Medical History  Diagnosis Date  . Coronary artery disease   . Hypertension   . Hyperlipidemia   . Myocardial infarct     hx of  . H/O: hysterectomy   . Hx of lumpectomy     Past Surgical History Past Surgical History  Procedure Laterality Date  . Angioplasty    . Knee surgery      right  . Carpal tunnel release      left  . Cardiac surgery      Allergies/Intolerances Allergies  Allergen Reactions  . Penicillins Anaphylaxis and Swelling  . Sulfonamide Derivatives Other (See Comments)    Unknown    Current Home Medications Current Outpatient Prescriptions  Medication Sig Dispense Refill  . amLODipine (NORVASC) 5 MG tablet Take 5 mg by mouth daily.        [name] aspirin 81 MG tablet Take 81 mg by mouth daily.        [name] atenolol (TENORMIN) 25 MG tablet Take 12.5 mg by mouth daily. Take 1/2 tablet daily      . Multiple Vitamins-Minerals (ICAPS) CAPS Take 1 capsule by mouth daily.        . nitroGLYCERIN (NITROSTAT) 0.4 MG SL tablet Place 0.4 mg under the tongue every 5 (five) minutes as needed.        . triamcinolone (NASACORT AQ) 55 MCG/ACT nasal inhaler Place 2 sprays into the nose daily as needed.      . vitamin B-12 (CYANOCOBALAMIN) 1000 MCG tablet Take 1,000 mcg by  mouth daily.       No current facility-administered medications for this visit.   Social History Social History  . Marital Status: Widowed   Social History Main Topics  . Smoking status: Never Smoker   . Smokeless tobacco: No  . Alcohol Use: No  . Drug Use: No   Review of Systems General: No chills, fever, night sweats or weight changes Cardiovascular: No chest pain, dyspnea on exertion, edema, orthopnea, palpitations, paroxysmal nocturnal dyspnea Dermatological: No rash, lesions or masses Respiratory: No cough, dyspnea Urologic: No hematuria, dysuria Abdominal: No nausea, vomiting, diarrhea, bright red blood per rectum, melena, or  hematemesis Neurologic: No visual changes, weakness, changes in mental status All other systems reviewed and are otherwise negative except as noted above.  Physical Exam Vitals: Blood pressure 146/94, pulse 68, height 5\' 5"  (1.651 m), weight 175 lb 6.4 oz (79.561 kg), SpO2 97.00%.  General: Well developed, well appearing 79 y.o. female in no acute distress. She appears younger than her stated age. HEENT: Normocephalic, atraumatic. EOMs intact. Sclera nonicteric. Oropharynx clear.  Neck: Supple without bruits. No JVD. Lungs: Respirations regular and unlabored, CTA bilaterally. No wheezes, rales or rhonchi. Heart: RRR. S1, S2 present. No murmurs, rub, S3 or S4. Abdomen: Soft, non-distended.   Extremities: No clubbing, cyanosis or edema. DP/PT/Radials 2+ and equal bilaterally. Psych: Normal affect. Neuro: Alert and oriented X 3. Moves all extremities spontaneously.   Diagnostics 12-lead ECG reviewed from PCP's office 12/23/2012 - NSR at 71 bpm; no ST-T wave abnormalities Labs from PCP's office 12/23/2012 - sodium 141, potassium 3.8, chloride 106, bicarb 28, BUN 14, Cr 0.84, WBC 7200, Hgb 14.5, Hct 43.5, Plt 192,000 Orthostatics done today - negative  Assessment and Plan 1. Dizziness  - most conistent with vertigo  - orthostatics negative  - no cardiac work-up indicated  - follow-up with Dr. Valentina Lucks and/or ENT 2. HTN  - increase Norvasc to 7.5 mg once daily  - keep BP log for next 2 weeks  - return for nurse visit and BP check in 2 weeks 3. CAD  - stable without anginal symptoms  - continue medical therapy  This plan of care was reviewed / formulated with Dr. Tenny Craw who is in clinic with me.  Signed, Rick Duff, PA-C 01/02/2013, 11:21 AM

## 2013-01-02 NOTE — Patient Instructions (Addendum)
Your physician has recommended you make the following change in your medication:   INCREASE YOUR NORVASC 7.5 MG ONCE A DAY (1 & 1/2 TABLETS OF YOUR 5 MG TABLETS)  Your physician recommends that you schedule a follow-up appointment in:   2 WEEKS FOR A NURSE VISIT TO CHECK YOUR BLOOD PRESSURE (01-15-2013 AT 11:00 A.M)  TAKE ALL OTHER MEDICATIONS THE SAME  Your physician recommends that you schedule a follow-up appointment in:  AS NEEDED

## 2013-01-08 DIAGNOSIS — H353290 Exudative age-related macular degeneration, unspecified eye, stage unspecified: Secondary | ICD-10-CM | POA: Diagnosis not present

## 2013-01-15 ENCOUNTER — Ambulatory Visit (INDEPENDENT_AMBULATORY_CARE_PROVIDER_SITE_OTHER): Payer: Medicare Other | Admitting: *Deleted

## 2013-01-15 VITALS — BP 128/72 | HR 67 | Ht 65.0 in | Wt 175.0 lb

## 2013-01-15 DIAGNOSIS — I10 Essential (primary) hypertension: Secondary | ICD-10-CM | POA: Diagnosis not present

## 2013-01-15 NOTE — Patient Instructions (Addendum)
  Pt request to be seen yearly, feels better seeing cardiology yearly/ recall reminder placed for Dr Tenny Craw.

## 2013-01-15 NOTE — Progress Notes (Signed)
Pt had increased amlodipine from 5 mg to 7.5 mg daily and is here today for a bp check. Pt took medications at 7:30 am bp done at 11:00 am. Left arm regular cuff, 128/72 p 67 Pt has list of readings that were given to [facility] CMA.  Pt request to be seen yearly/feels better seeing cardiology yearly/ recall reminder placed for Dr Tenny Craw.

## 2013-01-19 DIAGNOSIS — H905 Unspecified sensorineural hearing loss: Secondary | ICD-10-CM | POA: Diagnosis not present

## 2013-01-19 DIAGNOSIS — R42 Dizziness and giddiness: Secondary | ICD-10-CM | POA: Diagnosis not present

## 2013-01-19 DIAGNOSIS — H903 Sensorineural hearing loss, bilateral: Secondary | ICD-10-CM | POA: Diagnosis not present

## 2013-02-05 ENCOUNTER — Ambulatory Visit: Payer: Medicare Other | Attending: Physician Assistant | Admitting: Rehabilitative and Restorative Service Providers"

## 2013-02-05 DIAGNOSIS — R42 Dizziness and giddiness: Secondary | ICD-10-CM | POA: Diagnosis not present

## 2013-02-05 DIAGNOSIS — R269 Unspecified abnormalities of gait and mobility: Secondary | ICD-10-CM | POA: Diagnosis not present

## 2013-02-05 DIAGNOSIS — IMO0001 Reserved for inherently not codable concepts without codable children: Secondary | ICD-10-CM | POA: Diagnosis not present

## 2013-02-11 ENCOUNTER — Ambulatory Visit: Payer: Medicare Other | Admitting: Rehabilitative and Restorative Service Providers"

## 2013-02-11 DIAGNOSIS — IMO0001 Reserved for inherently not codable concepts without codable children: Secondary | ICD-10-CM | POA: Diagnosis not present

## 2013-02-11 DIAGNOSIS — R269 Unspecified abnormalities of gait and mobility: Secondary | ICD-10-CM | POA: Diagnosis not present

## 2013-02-11 DIAGNOSIS — R42 Dizziness and giddiness: Secondary | ICD-10-CM | POA: Diagnosis not present

## 2013-02-19 ENCOUNTER — Ambulatory Visit: Payer: Medicare Other | Admitting: Rehabilitative and Restorative Service Providers"

## 2013-02-19 DIAGNOSIS — R269 Unspecified abnormalities of gait and mobility: Secondary | ICD-10-CM | POA: Diagnosis not present

## 2013-02-19 DIAGNOSIS — IMO0001 Reserved for inherently not codable concepts without codable children: Secondary | ICD-10-CM | POA: Diagnosis not present

## 2013-02-19 DIAGNOSIS — R42 Dizziness and giddiness: Secondary | ICD-10-CM | POA: Diagnosis not present

## 2013-02-24 DIAGNOSIS — Z23 Encounter for immunization: Secondary | ICD-10-CM | POA: Diagnosis not present

## 2013-02-27 DIAGNOSIS — I10 Essential (primary) hypertension: Secondary | ICD-10-CM | POA: Diagnosis not present

## 2013-03-26 DIAGNOSIS — H353290 Exudative age-related macular degeneration, unspecified eye, stage unspecified: Secondary | ICD-10-CM | POA: Diagnosis not present

## 2013-03-26 DIAGNOSIS — H353190 Nonexudative age-related macular degeneration, unspecified eye, stage unspecified: Secondary | ICD-10-CM | POA: Diagnosis not present

## 2013-03-26 DIAGNOSIS — Z9849 Cataract extraction status, unspecified eye: Secondary | ICD-10-CM | POA: Diagnosis not present

## 2013-03-26 DIAGNOSIS — Z961 Presence of intraocular lens: Secondary | ICD-10-CM | POA: Diagnosis not present

## 2013-03-26 DIAGNOSIS — C4491 Basal cell carcinoma of skin, unspecified: Secondary | ICD-10-CM | POA: Diagnosis not present

## 2013-04-06 DIAGNOSIS — M1710 Unilateral primary osteoarthritis, unspecified knee: Secondary | ICD-10-CM | POA: Diagnosis not present

## 2013-04-06 DIAGNOSIS — IMO0002 Reserved for concepts with insufficient information to code with codable children: Secondary | ICD-10-CM | POA: Diagnosis not present

## 2013-04-10 ENCOUNTER — Ambulatory Visit: Payer: Self-pay

## 2013-04-15 ENCOUNTER — Ambulatory Visit (INDEPENDENT_AMBULATORY_CARE_PROVIDER_SITE_OTHER): Payer: Medicare Other

## 2013-04-15 ENCOUNTER — Encounter: Payer: Self-pay | Admitting: Podiatry

## 2013-04-15 ENCOUNTER — Ambulatory Visit (INDEPENDENT_AMBULATORY_CARE_PROVIDER_SITE_OTHER): Payer: Medicare Other | Admitting: Podiatry

## 2013-04-15 VITALS — BP 164/88 | HR 64 | Resp 12 | Ht 65.0 in | Wt 170.0 lb

## 2013-04-15 DIAGNOSIS — M7660 Achilles tendinitis, unspecified leg: Secondary | ICD-10-CM

## 2013-04-15 DIAGNOSIS — R52 Pain, unspecified: Secondary | ICD-10-CM

## 2013-04-15 DIAGNOSIS — M7731 Calcaneal spur, right foot: Secondary | ICD-10-CM

## 2013-04-15 DIAGNOSIS — M7730 Calcaneal spur, unspecified foot: Secondary | ICD-10-CM

## 2013-04-15 MED ORDER — TRIAMCINOLONE ACETONIDE 10 MG/ML IJ SUSP
5.0000 mg | Freq: Once | INTRAMUSCULAR | Status: AC
  Administered 2013-04-15: 5 mg via INTRA_ARTICULAR

## 2013-04-15 NOTE — Progress Notes (Signed)
Subjective:     Patient ID: Shelly Obrien, female   DOB: 1922/11/08, 79 y.o.   MRN: [id number]  Foot Pain   patient presents stating I'm having pain in the back of my right heel that has been present for about 6 months. States that it makes wearing shoe gear and ambulation difficult   Review of Systems  All other systems reviewed and are negative.       Objective:   Physical Exam  Nursing note and vitals reviewed. Constitutional: She is oriented to person, place, and time.  Cardiovascular: Intact distal pulses.   Musculoskeletal: Normal range of motion.  Neurological: She is oriented to person, place, and time.  Skin: Skin is warm.   posterior aspect of right heel lateral side is inflamed around the Achilles tendon and lateral to this point. Neurovascular status muscle strength intact with no equinus condition noted     Assessment:     Achilles tendinitis right foot lateral side note    Plan:     H&P and x-rays reviewed. At this point I have recommended careful injection explaining risks and possibility for rupture. Patient wants procedure and today I injected carefully lateral side 3 mg Kenalog 5 out of Xylocaine Marcaine mixture being careful not to put directly into the tendon. Advised on reduced activity for the next 7 days

## 2013-04-15 NOTE — Progress Notes (Signed)
N-SORE L-RT FOOT BACK OF THE HEEL D-6 MONTHS O-SLOWLY C-WORSE A-WALKING T-ICE

## 2013-04-15 NOTE — Progress Notes (Signed)
  Subjective:    Patient ID: Shelly Obrien, female    DOB: 1923-01-29, 79 y.o.   MRN: [id number]  HPI    Review of Systems  Eyes: Positive for pain.  All other systems reviewed and are negative.       Objective:   Physical Exam        Assessment & Plan:

## 2013-05-17 ENCOUNTER — Encounter (HOSPITAL_COMMUNITY): Payer: Self-pay | Admitting: Emergency Medicine

## 2013-05-17 ENCOUNTER — Emergency Department (HOSPITAL_COMMUNITY)
Admission: EM | Admit: 2013-05-17 | Discharge: 2013-05-17 | Disposition: A | Discharge status: Home / Self Care | Payer: Medicare Other | Attending: Emergency Medicine | Admitting: Emergency Medicine

## 2013-05-17 ENCOUNTER — Emergency Department (HOSPITAL_COMMUNITY): Payer: Medicare Other

## 2013-05-17 DIAGNOSIS — Z9861 Coronary angioplasty status: Secondary | ICD-10-CM | POA: Diagnosis not present

## 2013-05-17 DIAGNOSIS — R0602 Shortness of breath: Secondary | ICD-10-CM | POA: Diagnosis not present

## 2013-05-17 DIAGNOSIS — IMO0002 Reserved for concepts with insufficient information to code with codable children: Secondary | ICD-10-CM | POA: Insufficient documentation

## 2013-05-17 DIAGNOSIS — J209 Acute bronchitis, unspecified: Secondary | ICD-10-CM | POA: Diagnosis not present

## 2013-05-17 DIAGNOSIS — Z880 Allergy status to penicillin: Secondary | ICD-10-CM | POA: Insufficient documentation

## 2013-05-17 DIAGNOSIS — R062 Wheezing: Secondary | ICD-10-CM | POA: Diagnosis not present

## 2013-05-17 DIAGNOSIS — I10 Essential (primary) hypertension: Secondary | ICD-10-CM | POA: Diagnosis not present

## 2013-05-17 DIAGNOSIS — Z862 Personal history of diseases of the blood and blood-forming organs and certain disorders involving the immune mechanism: Secondary | ICD-10-CM | POA: Insufficient documentation

## 2013-05-17 DIAGNOSIS — I252 Old myocardial infarction: Secondary | ICD-10-CM | POA: Diagnosis not present

## 2013-05-17 DIAGNOSIS — Z7982 Long term (current) use of aspirin: Secondary | ICD-10-CM | POA: Insufficient documentation

## 2013-05-17 DIAGNOSIS — Z8639 Personal history of other endocrine, nutritional and metabolic disease: Secondary | ICD-10-CM | POA: Insufficient documentation

## 2013-05-17 DIAGNOSIS — Z9889 Other specified postprocedural states: Secondary | ICD-10-CM | POA: Insufficient documentation

## 2013-05-17 DIAGNOSIS — J40 Bronchitis, not specified as acute or chronic: Secondary | ICD-10-CM | POA: Diagnosis not present

## 2013-05-17 DIAGNOSIS — I2510 Atherosclerotic heart disease of native coronary artery without angina pectoris: Secondary | ICD-10-CM | POA: Diagnosis not present

## 2013-05-17 DIAGNOSIS — Z79899 Other long term (current) drug therapy: Secondary | ICD-10-CM | POA: Diagnosis not present

## 2013-05-17 MED ORDER — PREDNISONE 20 MG PO TABS: 40.0000 mg | ORAL_TABLET | Freq: Every day | ORAL | Status: DC

## 2013-05-17 MED ORDER — ALBUTEROL SULFATE HFA 108 (90 BASE) MCG/ACT IN AERS
2.0000 | INHALATION_SPRAY | Freq: Once | RESPIRATORY_TRACT | Status: AC
  Administered 2013-05-17: 2 via RESPIRATORY_TRACT
  Filled 2013-05-17: qty 6.7

## 2013-05-17 MED ORDER — IPRATROPIUM BROMIDE 0.02 % IN SOLN
0.5000 mg | Freq: Once | RESPIRATORY_TRACT | Status: AC
  Administered 2013-05-17: 0.5 mg via RESPIRATORY_TRACT
  Filled 2013-05-17: qty 2.5

## 2013-05-17 MED ORDER — PREDNISONE 20 MG PO TABS
60.0000 mg | ORAL_TABLET | Freq: Once | ORAL | Status: AC
  Administered 2013-05-17: 60 mg via ORAL
  Filled 2013-05-17: qty 3

## 2013-05-17 MED ORDER — ALBUTEROL SULFATE (5 MG/ML) 0.5% IN NEBU
5.0000 mg | INHALATION_SOLUTION | Freq: Once | RESPIRATORY_TRACT | Status: AC
  Administered 2013-05-17: 5 mg via RESPIRATORY_TRACT
  Filled 2013-05-17: qty 1

## 2013-05-17 NOTE — ED Notes (Signed)
Pt states that she woke up this morning with cough that is productive with white phlegm and chest congestion. Pt has PMH asthma and wheezing audible in right upper lobe.

## 2013-05-29 NOTE — ED Provider Notes (Signed)
CSN: [id number]     Arrival date & time 05/17/13  6387 History   First MD Initiated Contact with Patient 05/17/13 1011     Chief Complaint  Patient presents with  . Cough  . chest congestion    (Consider location/radiation/quality/duration/timing/severity/associated sxs/prior Treatment) HPI  35y female with cough. She first noticed it when she woke up this morning. She feels like she is wheezing. Cough is occasionally productive for whitish phlegm. No hemoptysis. No fevers or chills. Denies any shortness of breath. No unusual leg pain or swelling. No interventions prior to arrival. Symptoms relatively stable since she first noticed them. She denies any acute pain anywhere.  Past Medical History  Diagnosis Date  . Coronary artery disease   . Hypertension   . Hyperlipidemia   . Myocardial infarct     hx of  . H/O: hysterectomy   . Hx of lumpectomy    Past Surgical History  Procedure Laterality Date  . Angioplasty    . Knee surgery      right  . Carpal tunnel release      left  . Cardiac surgery     Family History  Problem Relation Age of Onset  . Coronary artery disease      positive family hx of  . Cancer      family hx of   History  Substance Use Topics  . Smoking status: Never Smoker   . Smokeless tobacco: Not on file  . Alcohol Use: No   OB History   Grav Para Term Preterm Abortions TAB SAB Ect Mult Living                 Review of Systems  All systems reviewed and negative, other than as noted in HPI.   Allergies  Penicillins; Contrast media; Sulfonamide derivatives; and Codeine  Home Medications   Current Outpatient Rx  Name  Route  Sig  Dispense  Refill  . amLODipine (NORVASC) 5 MG tablet      Take 1.5 Tablets Once a day (Total 7.5mg )   45 tablet   6     DOSE INCREASE   . aspirin 81 MG tablet   Oral   Take 81 mg by mouth daily.           [name] atenolol (TENORMIN) 25 MG tablet   Oral   Take 12.5 mg by mouth daily. Take 1/2 tablet daily        . Multiple Vitamins-Minerals (ICAPS) CAPS   Oral   Take 1 capsule by mouth daily.           [name] triamcinolone (NASACORT AQ) 55 MCG/ACT nasal inhaler   Nasal   Place 2 sprays into the nose daily as needed.         . vitamin B-12 (CYANOCOBALAMIN) 1000 MCG tablet   Oral   Take 1,000 mcg by mouth daily.         . nitroGLYCERIN (NITROSTAT) 0.4 MG SL tablet   Sublingual   Place 0.4 mg under the tongue every 5 (five) minutes as needed.           . predniSONE (DELTASONE) 20 MG tablet   Oral   Take 2 tablets (40 mg total) by mouth daily.   10 tablet   0    BP 140/78  Pulse 70  Temp(Src) 97.6 F (36.4 C) (Oral)  Resp 20  SpO2 94% Physical Exam  Nursing note and vitals reviewed. Constitutional: She appears well-developed and well-nourished.  No distress.  HENT:  Head: Normocephalic and atraumatic.  Eyes: Conjunctivae are normal. Right eye exhibits no discharge. Left eye exhibits no discharge.  Neck: Neck supple.  Cardiovascular: Normal rate, regular rhythm and normal heart sounds.  Exam reveals no gallop and no friction rub.   No murmur heard. Pulmonary/Chest: Effort normal. No respiratory distress. She has wheezes.  Speaking in complete sentences  Abdominal: Soft. She exhibits no distension. There is no tenderness.  Musculoskeletal: She exhibits no edema and no tenderness.  Lower extremities symmetric as compared to each other. No calf tenderness. Negative Homan's. No palpable cords.   Neurological: She is alert.  Skin: Skin is warm and dry.  Psychiatric: She has a normal mood and affect. Her behavior is normal. Thought content normal.    ED Course  Procedures (including critical care time) Labs Review Labs Reviewed - No data to display Imaging Review No results found.  EKG Interpretation   None       MDM   1. Bronchitis    89yf with likely viral bronchitis. Mild wheezing on exam, but no increased wob. cxr w/o focal infiltrate. Plan course of  steroids return precautions discussed.     Raeford Razor, MD 05/29/13 [phone]

## 2013-06-02 DIAGNOSIS — R5381 Other malaise: Secondary | ICD-10-CM | POA: Diagnosis not present

## 2013-06-02 DIAGNOSIS — R062 Wheezing: Secondary | ICD-10-CM | POA: Diagnosis not present

## 2013-06-11 DIAGNOSIS — Z9849 Cataract extraction status, unspecified eye: Secondary | ICD-10-CM | POA: Diagnosis not present

## 2013-06-11 DIAGNOSIS — H353190 Nonexudative age-related macular degeneration, unspecified eye, stage unspecified: Secondary | ICD-10-CM | POA: Diagnosis not present

## 2013-06-11 DIAGNOSIS — Z961 Presence of intraocular lens: Secondary | ICD-10-CM | POA: Diagnosis not present

## 2013-06-11 DIAGNOSIS — H353290 Exudative age-related macular degeneration, unspecified eye, stage unspecified: Secondary | ICD-10-CM | POA: Diagnosis not present

## 2013-06-23 DIAGNOSIS — I10 Essential (primary) hypertension: Secondary | ICD-10-CM | POA: Diagnosis not present

## 2013-06-23 DIAGNOSIS — I252 Old myocardial infarction: Secondary | ICD-10-CM | POA: Diagnosis not present

## 2013-06-23 DIAGNOSIS — J310 Chronic rhinitis: Secondary | ICD-10-CM | POA: Diagnosis not present

## 2013-06-23 DIAGNOSIS — E785 Hyperlipidemia, unspecified: Secondary | ICD-10-CM | POA: Diagnosis not present

## 2013-06-23 DIAGNOSIS — J45909 Unspecified asthma, uncomplicated: Secondary | ICD-10-CM | POA: Diagnosis not present

## 2013-08-27 DIAGNOSIS — Z961 Presence of intraocular lens: Secondary | ICD-10-CM | POA: Diagnosis not present

## 2013-08-27 DIAGNOSIS — Z9849 Cataract extraction status, unspecified eye: Secondary | ICD-10-CM | POA: Diagnosis not present

## 2013-08-27 DIAGNOSIS — H353190 Nonexudative age-related macular degeneration, unspecified eye, stage unspecified: Secondary | ICD-10-CM | POA: Diagnosis not present

## 2013-08-27 DIAGNOSIS — H353290 Exudative age-related macular degeneration, unspecified eye, stage unspecified: Secondary | ICD-10-CM | POA: Diagnosis not present

## 2013-09-14 DIAGNOSIS — Z23 Encounter for immunization: Secondary | ICD-10-CM | POA: Diagnosis not present

## 2013-09-14 DIAGNOSIS — M549 Dorsalgia, unspecified: Secondary | ICD-10-CM | POA: Diagnosis not present

## 2013-10-01 DIAGNOSIS — C4491 Basal cell carcinoma of skin, unspecified: Secondary | ICD-10-CM | POA: Diagnosis not present

## 2013-10-01 DIAGNOSIS — Z961 Presence of intraocular lens: Secondary | ICD-10-CM | POA: Diagnosis not present

## 2013-10-01 DIAGNOSIS — H353190 Nonexudative age-related macular degeneration, unspecified eye, stage unspecified: Secondary | ICD-10-CM | POA: Diagnosis not present

## 2013-10-01 DIAGNOSIS — L259 Unspecified contact dermatitis, unspecified cause: Secondary | ICD-10-CM | POA: Diagnosis not present

## 2013-10-01 DIAGNOSIS — H353290 Exudative age-related macular degeneration, unspecified eye, stage unspecified: Secondary | ICD-10-CM | POA: Diagnosis not present

## 2013-10-01 DIAGNOSIS — Z9849 Cataract extraction status, unspecified eye: Secondary | ICD-10-CM | POA: Diagnosis not present

## 2013-10-06 DIAGNOSIS — R58 Hemorrhage, not elsewhere classified: Secondary | ICD-10-CM | POA: Diagnosis not present

## 2013-10-12 DIAGNOSIS — M5137 Other intervertebral disc degeneration, lumbosacral region: Secondary | ICD-10-CM | POA: Diagnosis not present

## 2013-10-12 DIAGNOSIS — I70209 Unspecified atherosclerosis of native arteries of extremities, unspecified extremity: Secondary | ICD-10-CM | POA: Diagnosis not present

## 2013-10-12 DIAGNOSIS — M4310 Spondylolisthesis, site unspecified: Secondary | ICD-10-CM | POA: Diagnosis not present

## 2013-10-14 ENCOUNTER — Other Ambulatory Visit: Payer: Self-pay | Admitting: Dermatology

## 2013-10-14 DIAGNOSIS — C44721 Squamous cell carcinoma of skin of unspecified lower limb, including hip: Secondary | ICD-10-CM | POA: Diagnosis not present

## 2013-10-14 DIAGNOSIS — L738 Other specified follicular disorders: Secondary | ICD-10-CM | POA: Diagnosis not present

## 2013-10-14 DIAGNOSIS — L678 Other hair color and hair shaft abnormalities: Secondary | ICD-10-CM | POA: Diagnosis not present

## 2013-11-06 DIAGNOSIS — Z1231 Encounter for screening mammogram for malignant neoplasm of breast: Secondary | ICD-10-CM | POA: Diagnosis not present

## 2013-11-19 DIAGNOSIS — H353290 Exudative age-related macular degeneration, unspecified eye, stage unspecified: Secondary | ICD-10-CM | POA: Diagnosis not present

## 2013-11-19 DIAGNOSIS — H35369 Drusen (degenerative) of macula, unspecified eye: Secondary | ICD-10-CM | POA: Diagnosis not present

## 2013-11-19 DIAGNOSIS — H353190 Nonexudative age-related macular degeneration, unspecified eye, stage unspecified: Secondary | ICD-10-CM | POA: Diagnosis not present

## 2013-11-19 DIAGNOSIS — H3560 Retinal hemorrhage, unspecified eye: Secondary | ICD-10-CM | POA: Diagnosis not present

## 2013-11-22 ENCOUNTER — Emergency Department (HOSPITAL_COMMUNITY): Payer: Medicare Other

## 2013-11-22 ENCOUNTER — Emergency Department (HOSPITAL_COMMUNITY)
Admission: EM | Admit: 2013-11-22 | Discharge: 2013-11-22 | Disposition: A | Discharge status: Home / Self Care | Payer: Medicare Other | Attending: Emergency Medicine | Admitting: Emergency Medicine

## 2013-11-22 ENCOUNTER — Encounter (HOSPITAL_COMMUNITY): Payer: Self-pay | Admitting: Emergency Medicine

## 2013-11-22 DIAGNOSIS — Z880 Allergy status to penicillin: Secondary | ICD-10-CM | POA: Diagnosis not present

## 2013-11-22 DIAGNOSIS — S20229A Contusion of unspecified back wall of thorax, initial encounter: Secondary | ICD-10-CM | POA: Insufficient documentation

## 2013-11-22 DIAGNOSIS — Z90710 Acquired absence of both cervix and uterus: Secondary | ICD-10-CM | POA: Diagnosis not present

## 2013-11-22 DIAGNOSIS — I2510 Atherosclerotic heart disease of native coronary artery without angina pectoris: Secondary | ICD-10-CM | POA: Insufficient documentation

## 2013-11-22 DIAGNOSIS — W010XXA Fall on same level from slipping, tripping and stumbling without subsequent striking against object, initial encounter: Secondary | ICD-10-CM | POA: Insufficient documentation

## 2013-11-22 DIAGNOSIS — Y9389 Activity, other specified: Secondary | ICD-10-CM | POA: Insufficient documentation

## 2013-11-22 DIAGNOSIS — I252 Old myocardial infarction: Secondary | ICD-10-CM | POA: Insufficient documentation

## 2013-11-22 DIAGNOSIS — M533 Sacrococcygeal disorders, not elsewhere classified: Secondary | ICD-10-CM | POA: Diagnosis not present

## 2013-11-22 DIAGNOSIS — Z79899 Other long term (current) drug therapy: Secondary | ICD-10-CM | POA: Insufficient documentation

## 2013-11-22 DIAGNOSIS — Y9289 Other specified places as the place of occurrence of the external cause: Secondary | ICD-10-CM | POA: Insufficient documentation

## 2013-11-22 DIAGNOSIS — W19XXXA Unspecified fall, initial encounter: Secondary | ICD-10-CM

## 2013-11-22 DIAGNOSIS — I10 Essential (primary) hypertension: Secondary | ICD-10-CM | POA: Diagnosis not present

## 2013-11-22 DIAGNOSIS — Z7982 Long term (current) use of aspirin: Secondary | ICD-10-CM | POA: Insufficient documentation

## 2013-11-22 DIAGNOSIS — E785 Hyperlipidemia, unspecified: Secondary | ICD-10-CM | POA: Diagnosis not present

## 2013-11-22 DIAGNOSIS — T148XXA Other injury of unspecified body region, initial encounter: Secondary | ICD-10-CM

## 2013-11-22 DIAGNOSIS — Z9089 Acquired absence of other organs: Secondary | ICD-10-CM | POA: Insufficient documentation

## 2013-11-22 DIAGNOSIS — IMO0002 Reserved for concepts with insufficient information to code with codable children: Secondary | ICD-10-CM | POA: Diagnosis not present

## 2013-11-22 DIAGNOSIS — S300XXA Contusion of lower back and pelvis, initial encounter: Secondary | ICD-10-CM | POA: Diagnosis not present

## 2013-11-22 MED ORDER — HYDROCODONE-ACETAMINOPHEN 5-325 MG PO TABS: 1.0000 | ORAL_TABLET | Freq: Four times a day (QID) | ORAL | Status: DC | PRN

## 2013-11-22 NOTE — Discharge Instructions (Signed)
Contusion °A contusion is a deep bruise. Contusions are the result of an injury that caused bleeding under the skin. The contusion may turn blue, purple, or yellow. Minor injuries will give you a painless contusion, but more severe contusions may stay painful and swollen for a few weeks.  °CAUSES  °A contusion is usually caused by a blow, trauma, or direct force to an area of the body. °SYMPTOMS  °· Swelling and redness of the injured area. °· Bruising of the injured area. °· Tenderness and soreness of the injured area. °· Pain. °DIAGNOSIS  °The diagnosis can be made by taking a history and physical exam. An X-ray, CT scan, or MRI may be needed to determine if there were any associated injuries, such as fractures. °TREATMENT  °Specific treatment will depend on what area of the body was injured. In general, the best treatment for a contusion is resting, icing, elevating, and applying cold compresses to the injured area. Over-the-counter medicines may also be recommended for pain control. Ask your caregiver what the best treatment is for your contusion. °HOME CARE INSTRUCTIONS  °· Put ice on the injured area. °¨ Put ice in a plastic bag. °¨ Place a towel between your skin and the bag. °¨ Leave the ice on for 15-20 minutes, 3-4 times a day, or as directed by your health care provider. °· Only take over-the-counter or prescription medicines for pain, discomfort, or fever as directed by your caregiver. Your caregiver may recommend avoiding anti-inflammatory medicines (aspirin, ibuprofen, and naproxen) for 48 hours because these medicines may increase bruising. °· Rest the injured area. °· If possible, elevate the injured area to reduce swelling. °SEEK IMMEDIATE MEDICAL CARE IF:  °· You have increased bruising or swelling. °· You have pain that is getting worse. °· Your swelling or pain is not relieved with medicines. °MAKE SURE YOU:  °· Understand these instructions. °· Will watch your condition. °· Will get help right  away if you are not doing well or get worse. °Document Released: 02/28/2005 Document Revised: 05/26/2013 Document Reviewed: 03/26/2011 °[employer]® Patient Information ©2015 [employer]. This information is not intended to replace advice given to you by your health care provider. Make sure you discuss any questions you have with your health care provider. ° °

## 2013-11-22 NOTE — ED Provider Notes (Signed)
TIME SEEN: 2:40 PM  CHIEF COMPLAINT: Fall, coccyx pain  HPI: Pt is a 80 y.o. F with history of hypertension, hyperlipidemia, MI who presents to the emergency department she tripped over her garden hose yesterday and landed on her buttocks on the stairs of her back. She did not hit her head or lose consciousness. She denies any chest pain, shortness of breath, palpitations or dizziness that led her fall. She is not on anticoagulation. She states she is having pain in her buttocks every time she sits down. No numbness or focal weakness.  ROS: See HPI Constitutional: no fever  Eyes: no drainage  ENT: no runny nose   Cardiovascular:  no chest pain  Resp: no SOB  GI: no vomiting GU: no dysuria Integumentary: no rash  Allergy: no hives  Musculoskeletal: no leg swelling  Neurological: no slurred speech ROS otherwise negative  PAST MEDICAL HISTORY/PAST SURGICAL HISTORY:  Past Medical History  Diagnosis Date  . Coronary artery disease   . Hypertension   . Hyperlipidemia   . Myocardial infarct     hx of  . H/O: hysterectomy   . Hx of lumpectomy     MEDICATIONS:  Prior to Admission medications   Medication Sig Start Date End Date Taking? Authorizing Amdrew Oboyle  amLODipine (NORVASC) 5 MG tablet Take 5 mg by mouth every morning.    Yes Historical Jaquaya Coyle, MD  aspirin 81 MG tablet Take 81 mg by mouth every morning.    Yes Historical Osmany Azer, MD  atenolol (TENORMIN) 25 MG tablet Take 25 mg by mouth daily.    Yes Historical Charleton Deyoung, MD  Multiple Vitamins-Minerals (ICAPS) CAPS Take 1 capsule by mouth every morning.    Yes Historical Aurore Redinger, MD  nitroGLYCERIN (NITROSTAT) 0.4 MG SL tablet Place 0.4 mg under the tongue every 5 (five) minutes as needed for chest pain.    Yes Historical Sankalp Ferrell, MD  triamcinolone (NASACORT ALLERGY 24HR) 55 MCG/ACT AERO nasal inhaler Place 2 sprays into the nose daily as needed (allergies).   Yes Historical Capri Raben, MD    ALLERGIES:  Allergies  Allergen  Reactions  . Penicillins Anaphylaxis and Swelling  . Contrast Media [Iodinated Diagnostic Agents] Hives  . Sulfonamide Derivatives Other (See Comments)    Unknown   . Codeine Rash    SOCIAL HISTORY:  History  Substance Use Topics  . Smoking status: Never Smoker   . Smokeless tobacco: Not on file  . Alcohol Use: No    FAMILY HISTORY: Family History  Problem Relation Age of Onset  . Coronary artery disease      positive family hx of  . Cancer      family hx of    EXAM: BP 134/73  Pulse 77  Temp(Src) 98.2 F (36.8 C) (Oral)  Resp 20  SpO2 95% CONSTITUTIONAL: Alert and oriented and responds appropriately to questions. Well-appearing; well-nourished; GCS 15 HEAD: Normocephalic; atraumatic EYES: Conjunctivae clear, PERRL, EOMI ENT: normal nose; no rhinorrhea; moist mucous membranes; pharynx without lesions noted; no dental injury; no septal hematoma NECK: Supple, no meningismus, no LAD; no midline spinal tenderness, step-off or deformity CARD: RRR; S1 and S2 appreciated; no murmurs, no clicks, no rubs, no gallops RESP: Normal chest excursion without splinting or tachypnea; breath sounds clear and equal bilaterally; no wheezes, no rhonchi, no rales; chest wall stable, nontender to palpation ABD/GI: Normal bowel sounds; non-distended; soft, non-tender, no rebound, no guarding PELVIS:  stable, nontender to palpation; patient is tender to palpation over her coccyx posteriorly with no  obvious deformity BACK:  The back appears normal and is non-tender to palpation, there is no CVA tenderness; no midline spinal tenderness, step-off or deformity EXT: Normal ROM in all joints; non-tender to palpation; no edema; normal capillary refill; no cyanosis    SKIN: Normal color for age and race; warm NEURO: Moves all extremities equally, sensation to light touch intact diffusely, cranial nerves II through XII intact, normal gait PSYCH: The patient's mood and manner are appropriate. Grooming and  personal hygiene are appropriate.  MEDICAL DECISION MAKING: Patient here with mechanical fall that occurred yesterday and pain over her coccyx. She is requesting an x-ray. She states she care of herself to the emergency department and does not have anyone to pick her up. She has been taking Tylenol at home which has helped her pain. She refuses any Tylenol or ibuprofen currently. No other injury seen on exam. Will obtain x-ray today but anticipate discharge home.  ED PROGRESS: X-ray shows no acute fracture or dislocation. Discussed with patient who is pleased. We'll discharge with prescription for Vicodin to use as needed for pain control, ibuprofen at home. Discussed strict return precautions and supportive care instructions. She verbalized understanding is comfortable with plan. She is pleased with care.     [place], DO 11/22/13 1528

## 2013-11-22 NOTE — ED Notes (Signed)
Pt c/o coccyx pain. Stated that she tripped over a garden hose a landed on her buttocks. No obvious swelling or discoloration noted on coccyx. Denies dizziness or LOC. Pt alert and appropriate at present

## 2013-11-25 DIAGNOSIS — H905 Unspecified sensorineural hearing loss: Secondary | ICD-10-CM | POA: Diagnosis not present

## 2013-11-25 DIAGNOSIS — H903 Sensorineural hearing loss, bilateral: Secondary | ICD-10-CM | POA: Diagnosis not present

## 2013-11-30 DIAGNOSIS — Z85828 Personal history of other malignant neoplasm of skin: Secondary | ICD-10-CM | POA: Diagnosis not present

## 2013-12-03 DIAGNOSIS — H353290 Exudative age-related macular degeneration, unspecified eye, stage unspecified: Secondary | ICD-10-CM | POA: Diagnosis not present

## 2013-12-03 DIAGNOSIS — Z9849 Cataract extraction status, unspecified eye: Secondary | ICD-10-CM | POA: Diagnosis not present

## 2013-12-03 DIAGNOSIS — C4491 Basal cell carcinoma of skin, unspecified: Secondary | ICD-10-CM | POA: Diagnosis not present

## 2013-12-03 DIAGNOSIS — Z961 Presence of intraocular lens: Secondary | ICD-10-CM | POA: Diagnosis not present

## 2013-12-15 DIAGNOSIS — I10 Essential (primary) hypertension: Secondary | ICD-10-CM | POA: Diagnosis not present

## 2013-12-15 DIAGNOSIS — R7301 Impaired fasting glucose: Secondary | ICD-10-CM | POA: Diagnosis not present

## 2013-12-15 DIAGNOSIS — Z1331 Encounter for screening for depression: Secondary | ICD-10-CM | POA: Diagnosis not present

## 2014-01-14 DIAGNOSIS — H353290 Exudative age-related macular degeneration, unspecified eye, stage unspecified: Secondary | ICD-10-CM | POA: Diagnosis not present

## 2014-01-18 ENCOUNTER — Ambulatory Visit: Payer: Medicare Other | Admitting: Internal Medicine

## 2014-02-10 DIAGNOSIS — H353290 Exudative age-related macular degeneration, unspecified eye, stage unspecified: Secondary | ICD-10-CM | POA: Diagnosis not present

## 2014-02-11 DIAGNOSIS — M1710 Unilateral primary osteoarthritis, unspecified knee: Secondary | ICD-10-CM | POA: Diagnosis not present

## 2014-02-21 NOTE — Progress Notes (Signed)
HPI Patient is an 80 yo who was previously followed by Lowella Dell  Last seen in 2012.  Also seen by B Edmonson in 2014 Shelly Obrien Shelly Obrien is a 80 y.o. female with known CAD s/p remote PCI to LAD and HTN .  Also history of dizziness  Followed by ENT   Has pill to take if develops problem  Patinet says breathing is fine Prior to angioplasty had CP and arm pain NO symptoms like that now   Active  Works in yard  No prblems    Allergies  Allergen Reactions  . Penicillins Anaphylaxis and Swelling  . Contrast Media [Iodinated Diagnostic Agents] Hives  . Sulfonamide Derivatives Other (See Comments)    Unknown   . Codeine Rash    Current Outpatient Prescriptions  Medication Sig Dispense Refill  . amLODipine (NORVASC) 5 MG tablet Take 5 mg by mouth every morning.       [name] aspirin 81 MG tablet Take 81 mg by mouth every morning.       [name] atenolol (TENORMIN) 25 MG tablet Take 25 mg by mouth daily.       . Multiple Vitamins-Minerals (ICAPS) CAPS Take 1 capsule by mouth every morning.       . nitroGLYCERIN (NITROSTAT) 0.4 MG SL tablet Place 0.4 mg under the tongue every 5 (five) minutes as needed for chest pain.       [name] triamcinolone (NASACORT ALLERGY 24HR) 55 MCG/ACT AERO nasal inhaler Place 2 sprays into the nose daily as needed (allergies).       No current facility-administered medications for this visit.    Past Medical History  Diagnosis Date  . Coronary artery disease   . Hypertension   . Hyperlipidemia   . Myocardial infarct     hx of  . H/O: hysterectomy   . Hx of lumpectomy     Past Surgical History  Procedure Laterality Date  . Angioplasty    . Knee surgery      right  . Carpal tunnel release      left  . Cardiac surgery      Family History  Problem Relation Age of Onset  . Coronary artery disease      positive family hx of  . Cancer      family hx of    History   Social History  . Marital Status: Widowed    Spouse Name: N/A    Number of Children: N/A  . Years of  Education: N/A   Occupational History  . Not on file.   Social History Main Topics  . Smoking status: Never Smoker   . Smokeless tobacco: Not on file  . Alcohol Use: No  . Drug Use: No  . Sexual Activity: Yes    Birth Control/ Protection: Post-menopausal   Other Topics Concern  . Not on file   Social History Narrative  . No narrative on file    Review of Systems:  All systems reviewed.  They are negative to the above problem except as previously stated.  Vital Signs: BP 142/78  Pulse 62  Ht 5\' 5"  (1.651 m)  Wt 173 lb (78.472 kg)  BMI 28.79 kg/m2  Physical Exam patinet is in NAD HEENT:  Normocephalic, atraumatic. EOMI, PERRLA.  Neck: JVP is normal.  No bruits.  Lungs: clear to auscultation. No rales no wheezes.  Heart: Regular rate and rhythm. Normal S1, S2. No S3.   No significant murmurs. PMI not displaced.  Abdomen:  Supple, nontender. Normal bowel sounds. No masses. No hepatomegaly.  Extremities:   Good distal pulses throughout. No lower extremity edema.  Musculoskeletal :moving all extremities.  Neuro:   alert and oriented x3.  CN II-XII grossly intact.  EKG  SR 62  Occaionasl PVC   Assessment and Plan: 1.  CAD  Asymptomatic  Very active  Will follow  2.  HL  WIll get lipids from Dr Coral Spikes  I think she should be on a statin  She says stopped about 1 year ago due to knee pain  On lpitor at time.  Knees better  F/U end of next summer

## 2014-02-22 ENCOUNTER — Encounter: Payer: Self-pay | Admitting: Internal Medicine

## 2014-02-22 ENCOUNTER — Ambulatory Visit (INDEPENDENT_AMBULATORY_CARE_PROVIDER_SITE_OTHER): Payer: Medicare Other | Admitting: Internal Medicine

## 2014-02-22 VITALS — BP 142/78 | HR 62 | Ht 65.0 in | Wt 173.0 lb

## 2014-02-22 DIAGNOSIS — I2510 Atherosclerotic heart disease of native coronary artery without angina pectoris: Secondary | ICD-10-CM

## 2014-02-22 NOTE — Patient Instructions (Signed)
Your physician recommends that you continue on your current medications as directed. Please refer to the Current Medication list given to you today. Your physician wants you to follow-up in: 1 year with Dr. Ross.  You will receive a reminder letter in the mail two months in advance. If you don't receive a letter, please call our office to schedule the follow-up appointment.  

## 2014-02-25 DIAGNOSIS — Z9849 Cataract extraction status, unspecified eye: Secondary | ICD-10-CM | POA: Diagnosis not present

## 2014-02-25 DIAGNOSIS — Z961 Presence of intraocular lens: Secondary | ICD-10-CM | POA: Diagnosis not present

## 2014-02-25 DIAGNOSIS — H353290 Exudative age-related macular degeneration, unspecified eye, stage unspecified: Secondary | ICD-10-CM | POA: Diagnosis not present

## 2014-02-25 DIAGNOSIS — C4491 Basal cell carcinoma of skin, unspecified: Secondary | ICD-10-CM | POA: Diagnosis not present

## 2014-03-01 DIAGNOSIS — Z85828 Personal history of other malignant neoplasm of skin: Secondary | ICD-10-CM | POA: Diagnosis not present

## 2014-03-01 DIAGNOSIS — L820 Inflamed seborrheic keratosis: Secondary | ICD-10-CM | POA: Diagnosis not present

## 2014-03-04 DIAGNOSIS — H3532 Exudative age-related macular degeneration: Secondary | ICD-10-CM | POA: Diagnosis not present

## 2014-03-09 DIAGNOSIS — H02003 Unspecified entropion of right eye, unspecified eyelid: Secondary | ICD-10-CM | POA: Diagnosis not present

## 2014-03-11 DIAGNOSIS — Z23 Encounter for immunization: Secondary | ICD-10-CM | POA: Diagnosis not present

## 2014-03-22 ENCOUNTER — Encounter (HOSPITAL_BASED_OUTPATIENT_CLINIC_OR_DEPARTMENT_OTHER): Payer: Self-pay | Admitting: *Deleted

## 2014-03-22 NOTE — Progress Notes (Signed)
Pt sees dr [name] for cad-stable-pt lives alone, drives works in yard, cares for [employer] come in for bmet-

## 2014-03-23 ENCOUNTER — Encounter (HOSPITAL_BASED_OUTPATIENT_CLINIC_OR_DEPARTMENT_OTHER)
Admission: RE | Admit: 2014-03-23 | Discharge: 2014-03-23 | Disposition: A | Discharge status: Home / Self Care | Payer: Medicare Other | Source: Ambulatory Visit | Attending: Plastic Surgery | Admitting: Plastic Surgery

## 2014-03-23 DIAGNOSIS — Z01812 Encounter for preprocedural laboratory examination: Secondary | ICD-10-CM | POA: Diagnosis not present

## 2014-03-23 LAB — BASIC METABOLIC PANEL
Anion gap: 10 (ref 5–15)
BUN: 14 mg/dL (ref 6–23)
CO2: 26 mEq/L (ref 19–32)
Calcium: 9.9 mg/dL (ref 8.4–10.5)
Chloride: 105 mEq/L (ref 96–112)
Creatinine, Ser: 1.02 mg/dL (ref 0.50–1.10)
GFR calc Af Amer: 55 mL/min — ABNORMAL LOW (ref >90)
GFR calc non Af Amer: 47 mL/min — ABNORMAL LOW (ref >90)
Glucose, Bld: 101 mg/dL — ABNORMAL HIGH (ref 70–99)
Potassium: 5.1 mEq/L (ref 3.7–5.3)
Sodium: 141 mEq/L (ref 137–147)

## 2014-03-25 ENCOUNTER — Ambulatory Visit (HOSPITAL_BASED_OUTPATIENT_CLINIC_OR_DEPARTMENT_OTHER): Admission: RE | Admit: 2014-03-25 | Payer: Medicare Other | Source: Ambulatory Visit | Admitting: Plastic Surgery

## 2014-03-25 HISTORY — DX: Presence of spectacles and contact lenses: Z97.3

## 2014-03-25 SURGERY — BLEPHAROPLASTY
Anesthesia: General | Laterality: Right

## 2014-04-02 ENCOUNTER — Encounter (HOSPITAL_BASED_OUTPATIENT_CLINIC_OR_DEPARTMENT_OTHER): Payer: Self-pay | Admitting: *Deleted

## 2014-04-02 NOTE — Progress Notes (Signed)
Pt was r/s from 03/25/14-she had a cold-better

## 2014-04-07 ENCOUNTER — Encounter (HOSPITAL_BASED_OUTPATIENT_CLINIC_OR_DEPARTMENT_OTHER)
Admission: RE | Disposition: A | Discharge status: Home / Self Care | Payer: Self-pay | Source: Ambulatory Visit | Attending: Plastic Surgery

## 2014-04-07 ENCOUNTER — Ambulatory Visit (HOSPITAL_BASED_OUTPATIENT_CLINIC_OR_DEPARTMENT_OTHER): Payer: Medicare Other | Admitting: Anesthesiology

## 2014-04-07 ENCOUNTER — Other Ambulatory Visit: Payer: Self-pay | Admitting: Plastic Surgery

## 2014-04-07 ENCOUNTER — Encounter (HOSPITAL_BASED_OUTPATIENT_CLINIC_OR_DEPARTMENT_OTHER): Payer: Self-pay | Admitting: *Deleted

## 2014-04-07 ENCOUNTER — Ambulatory Visit (HOSPITAL_BASED_OUTPATIENT_CLINIC_OR_DEPARTMENT_OTHER)
Admission: RE | Admit: 2014-04-07 | Discharge: 2014-04-07 | Disposition: A | Discharge status: Home / Self Care | Payer: Medicare Other | Source: Ambulatory Visit | Attending: Plastic Surgery | Admitting: Plastic Surgery

## 2014-04-07 DIAGNOSIS — L821 Other seborrheic keratosis: Secondary | ICD-10-CM | POA: Diagnosis not present

## 2014-04-07 DIAGNOSIS — I2510 Atherosclerotic heart disease of native coronary artery without angina pectoris: Secondary | ICD-10-CM | POA: Diagnosis not present

## 2014-04-07 DIAGNOSIS — I252 Old myocardial infarction: Secondary | ICD-10-CM | POA: Diagnosis not present

## 2014-04-07 DIAGNOSIS — I10 Essential (primary) hypertension: Secondary | ICD-10-CM | POA: Insufficient documentation

## 2014-04-07 DIAGNOSIS — H02002 Unspecified entropion of right lower eyelid: Secondary | ICD-10-CM | POA: Diagnosis not present

## 2014-04-07 DIAGNOSIS — L578 Other skin changes due to chronic exposure to nonionizing radiation: Secondary | ICD-10-CM | POA: Diagnosis not present

## 2014-04-07 DIAGNOSIS — H02052 Trichiasis without entropian right lower eyelid: Secondary | ICD-10-CM | POA: Diagnosis not present

## 2014-04-07 DIAGNOSIS — H02003 Unspecified entropion of right eye, unspecified eyelid: Secondary | ICD-10-CM

## 2014-04-07 HISTORY — DX: Adverse effect of unspecified anesthetic, initial encounter: T41.45XA

## 2014-04-07 HISTORY — DX: Other specified postprocedural states: R11.2

## 2014-04-07 HISTORY — PX: BROW LIFT: SHX178

## 2014-04-07 HISTORY — DX: Other complications of anesthesia, initial encounter: T88.59XA

## 2014-04-07 HISTORY — DX: Nausea with vomiting, unspecified: Z98.890

## 2014-04-07 LAB — POCT HEMOGLOBIN-HEMACUE: Hemoglobin: 15.7 g/dL — ABNORMAL HIGH (ref 12.0–15.0)

## 2014-04-07 SURGERY — BLEPHAROPLASTY
Anesthesia: Monitor Anesthesia Care | Site: Eye | Laterality: Right

## 2014-04-07 MED ORDER — ONDANSETRON HCL 4 MG/2ML IJ SOLN
INTRAMUSCULAR | Status: DC | PRN
  Administered 2014-04-07: 4 mg via INTRAVENOUS

## 2014-04-07 MED ORDER — LIDOCAINE-EPINEPHRINE 1 %-1:100000 IJ SOLN
INTRAMUSCULAR | Status: DC | PRN
  Administered 2014-04-07: 5.5 mL

## 2014-04-07 MED ORDER — LACTATED RINGERS IV SOLN
INTRAVENOUS | Status: DC
  Administered 2014-04-07: 09:00:00 via INTRAVENOUS

## 2014-04-07 MED ORDER — BSS IO SOLN
INTRAOCULAR | Status: DC | PRN
  Administered 2014-04-07: 1

## 2014-04-07 MED ORDER — BSS IO SOLN
INTRAOCULAR | Status: AC
  Filled 2014-04-07: qty 15

## 2014-04-07 MED ORDER — PROPOFOL 10 MG/ML IV BOLUS
INTRAVENOUS | Status: AC
  Filled 2014-04-07: qty 40

## 2014-04-07 MED ORDER — FENTANYL CITRATE 0.05 MG/ML IJ SOLN: 50.0000 ug | INTRAMUSCULAR | Status: DC | PRN

## 2014-04-07 MED ORDER — MIDAZOLAM HCL 2 MG/2ML IJ SOLN: 1.0000 mg | INTRAMUSCULAR | Status: DC | PRN

## 2014-04-07 MED ORDER — PROPOFOL INFUSION 10 MG/ML OPTIME
INTRAVENOUS | Status: DC | PRN
  Administered 2014-04-07: 50 ug/kg/min via INTRAVENOUS

## 2014-04-07 MED ORDER — TETRACAINE HCL 0.5 % OP SOLN
OPHTHALMIC | Status: DC | PRN
  Administered 2014-04-07: 1 [drp] via OPHTHALMIC

## 2014-04-07 MED ORDER — FENTANYL CITRATE 0.05 MG/ML IJ SOLN
INTRAMUSCULAR | Status: AC
Start: 2014-04-07 — End: 2014-04-07
  Filled 2014-04-07: qty 2

## 2014-04-07 MED ORDER — CIPROFLOXACIN IN D5W 400 MG/200ML IV SOLN
INTRAVENOUS | Status: AC
  Filled 2014-04-07: qty 200

## 2014-04-07 MED ORDER — FENTANYL CITRATE 0.05 MG/ML IJ SOLN
INTRAMUSCULAR | Status: DC | PRN
  Administered 2014-04-07: 50 ug via INTRAVENOUS
  Administered 2014-04-07 (×2): 25 ug via INTRAVENOUS

## 2014-04-07 MED ORDER — LIDOCAINE-EPINEPHRINE 1 %-1:100000 IJ SOLN
INTRAMUSCULAR | Status: AC
  Filled 2014-04-07: qty 1

## 2014-04-07 MED ORDER — BACITRACIN-POLYMYXIN B 500-10000 UNIT/GM OP OINT
TOPICAL_OINTMENT | OPHTHALMIC | Status: DC | PRN
  Administered 2014-04-07: 1 via OPHTHALMIC

## 2014-04-07 MED ORDER — TOBRAMYCIN-DEXAMETHASONE 0.3-0.1 % OP OINT
TOPICAL_OINTMENT | OPHTHALMIC | Status: AC
  Filled 2014-04-07: qty 3.5

## 2014-04-07 MED ORDER — TETRACAINE HCL 0.5 % OP SOLN
OPHTHALMIC | Status: AC
  Filled 2014-04-07: qty 2

## 2014-04-07 MED ORDER — CIPROFLOXACIN IN D5W 400 MG/200ML IV SOLN
400.0000 mg | INTRAVENOUS | Status: AC
  Administered 2014-04-07: 400 mg via INTRAVENOUS

## 2014-04-07 MED ORDER — PROPOFOL 10 MG/ML IV BOLUS
INTRAVENOUS | Status: DC | PRN
  Administered 2014-04-07 (×2): 20 mg via INTRAVENOUS

## 2014-04-07 SURGICAL SUPPLY — 43 items
APL SRG 3 HI ABS STRL LF PLS (MISCELLANEOUS) ×1
APPLICATOR COTTON TIP 6IN STRL (MISCELLANEOUS) IMPLANT
APPLICATOR DR MATTHEWS STRL (MISCELLANEOUS) ×1 IMPLANT
BANDAGE EYE OVAL (MISCELLANEOUS) ×2 IMPLANT
BLADE SURG 15 STRL LF DISP TIS (BLADE) ×2 IMPLANT
BLADE SURG 15 STRL SS (BLADE) ×4
CANISTER SUCTION 1200CC (MISCELLANEOUS) ×1 IMPLANT
CORDS BIPOLAR (ELECTRODE) ×1 IMPLANT
COVER BACK TABLE 60X90IN (DRAPES) ×2 IMPLANT
COVER MAYO STAND STRL (DRAPES) ×2 IMPLANT
DECANTER SPIKE VIAL GLASS SM (MISCELLANEOUS) IMPLANT
DRAPE U-SHAPE 76X120 STRL (DRAPES) ×2 IMPLANT
ELECT NDL BLADE 2-5/6 (NEEDLE) IMPLANT
ELECT NEEDLE BLADE 2-5/6 (NEEDLE) ×2 IMPLANT
ELECT REM PT RETURN 9FT ADLT (ELECTROSURGICAL) ×2
ELECTRODE REM PT RTRN 9FT ADLT (ELECTROSURGICAL) ×1 IMPLANT
GLOVE BIO SURGEON STRL SZ 6.5 (GLOVE) ×4 IMPLANT
GLOVE SURG SS PI 7.0 STRL IVOR (GLOVE) ×2 IMPLANT
GOWN STRL REUS W/ TWL LRG LVL3 (GOWN DISPOSABLE) ×2 IMPLANT
GOWN STRL REUS W/TWL LRG LVL3 (GOWN DISPOSABLE) ×6
NDL HYPO 30GX1 BEV (NEEDLE) IMPLANT
NEEDLE 27GAX1X1/2 (NEEDLE) ×1 IMPLANT
NEEDLE HYPO 30GX1 BEV (NEEDLE) IMPLANT
PACK BASIN DAY SURGERY FS (CUSTOM PROCEDURE TRAY) ×2 IMPLANT
PENCIL BUTTON HOLSTER BLD 10FT (ELECTRODE) ×1 IMPLANT
PUNCH BIOPSY DERMAL 5MM STRL (MISCELLANEOUS) ×2 IMPLANT
SHEILD EYE MED CORNL SHD 22X21 (OPHTHALMIC RELATED) ×2
SHIELD EYE LENSE ONLY DISP (MISCELLANEOUS) ×2 IMPLANT
SHIELD EYE MED CORNL SHD 22X21 (OPHTHALMIC RELATED) IMPLANT
SLEEVE SCD COMPRESS KNEE MED (MISCELLANEOUS) ×1 IMPLANT
STRIP CLOSURE SKIN 1/2X4 (GAUZE/BANDAGES/DRESSINGS) ×2 IMPLANT
SUCTION FRAZIER TIP 10 FR DISP (SUCTIONS) ×1 IMPLANT
SUT CHROMIC 6 0 G 1 (SUTURE) ×1 IMPLANT
SUT MNCRL 6-0 UNDY P1 1X18 (SUTURE) IMPLANT
SUT MNCRL AB 3-0 PS2 18 (SUTURE) ×1 IMPLANT
SUT MONOCRYL 6-0 P1 1X18 (SUTURE) ×1
SUT PROLENE 6 0 P 1 18 (SUTURE) ×2 IMPLANT
SUT VIC AB 4-0 RB1 27 (SUTURE) ×2
SUT VIC AB 4-0 RB1 27X BRD (SUTURE) IMPLANT
SYR CONTROL 10ML LL (SYRINGE) ×1 IMPLANT
TOWEL OR 17X24 6PK STRL BLUE (TOWEL DISPOSABLE) ×4 IMPLANT
TRAY DSU PREP LF (CUSTOM PROCEDURE TRAY) ×2 IMPLANT
TUBING SUCTION 1/4X6FT (MISCELLANEOUS) ×1 IMPLANT

## 2014-04-07 NOTE — H&P (Signed)
Shelly Obrien is an 80 y.o. female.   Chief Complaint: right lower eyelid entropion HPI: The patient is an 80 yrs old wf here for evaluation of her right eye. She underwent resection of a basal cell carcinoma of the right lower lid / cheek area. Over the past several months she developed entropion and trichiasis with the lashes resting directly on her eye. She has redness and irritation of the eye on that side. There is 2 mm of scleral show off the right lower portion of the eye. She has macular degeneration and is treated at [facility] for this. She also had an MI but is stable an does not have any ongoing issues. She was seen by her PCP and cardiologist recently.  Past Medical History  Diagnosis Date  . Coronary artery disease   . Hypertension   . Hyperlipidemia   . Myocardial infarct     hx of  . H/O: hysterectomy   . Hx of lumpectomy   . Wears glasses   . Complication of anesthesia     hard to wake   . PONV (postoperative nausea and vomiting)     Past Surgical History  Procedure Laterality Date  . Angioplasty    . Knee surgery      right  . Carpal tunnel release      left  . Dilation and curettage of uterus    . Abdominal hysterectomy    . Eye surgery      both cataracts    Family History  Problem Relation Age of Onset  . Coronary artery disease      positive family hx of  . Cancer      family hx of   Social History:  reports that she has never smoked. She does not have any smokeless tobacco history on file. She reports that she does not drink alcohol or use illicit drugs.  Allergies:  Allergies  Allergen Reactions  . Penicillins Anaphylaxis and Swelling  . Contrast Media [Iodinated Diagnostic Agents] Hives  . Sulfonamide Derivatives Other (See Comments)    Unknown   . Codeine Rash     (Not in a hospital admission)  No results found for this or any previous visit (from the past 48 hour(s)). No results found.  Review of Systems  Constitutional: Negative.    HENT: Negative.   Eyes: Negative.   Respiratory: Negative.   Cardiovascular: Negative.   Gastrointestinal: Negative.   Genitourinary: Negative.   Musculoskeletal: Negative.   Skin: Negative.   Neurological: Negative.   Psychiatric/Behavioral: Negative.     There were no vitals taken for this visit. Physical Exam  Constitutional: She is oriented to person, place, and time. She appears well-developed.  HENT:  Head: Normocephalic and atraumatic.  Cardiovascular: Normal rate.   Respiratory: Effort normal.  GI: Soft.  Neurological: She is alert and oriented to person, place, and time.  Psychiatric: She has a normal mood and affect. Her behavior is normal. Judgment and thought content normal.     Assessment/Plan Repair of right lower eyelid entropion and trichiasis.  Risks and complications were discussed.  SANGER,CLAIRE 04/07/2014, 7:41 AM

## 2014-04-07 NOTE — Anesthesia Postprocedure Evaluation (Signed)
  Anesthesia Post-op Note  Patient: Shelly Obrien  Procedure(s) Performed: Procedure(s): REPAIR OF ENTROPION AND TRICHIASIS OF RIGHT LOWER EYE LID  (Right)  Patient Location: PACU  Anesthesia Type: MAC   Level of Consciousness: awake, alert  and oriented  Airway and Oxygen Therapy: Patient Spontanous Breathing  Post-op Pain: mild  Post-op Assessment: Post-op Vital signs reviewed  Post-op Vital Signs: Reviewed  Last Vitals:  Filed Vitals:   04/07/14 1230  BP: 148/80  Pulse: 63  Temp: 36 C  Resp: 16    Complications: No apparent anesthesia complications

## 2014-04-07 NOTE — Transfer of Care (Signed)
Immediate Anesthesia Transfer of Care Note  Patient: Shelly Obrien  Procedure(s) Performed: Procedure(s): REPAIR OF ENTROPION AND TRICHIASIS OF RIGHT LOWER EYE LID  (Right)  Patient Location: PACU  Anesthesia Type:MAC  Level of Consciousness: sedated  Airway & Oxygen Therapy: Patient Spontanous Breathing and Patient connected to nasal cannula oxygen  Post-op Assessment: Report given to PACU RN and Post -op Vital signs reviewed and stable  Post vital signs: Reviewed and stable  Complications: No apparent anesthesia complications

## 2014-04-07 NOTE — Anesthesia Preprocedure Evaluation (Signed)
Anesthesia Evaluation  Patient identified by MRN, date of birth, ID band Patient awake    Reviewed: Allergy & Precautions, H&P , NPO status , Patient's Chart, lab work & pertinent test results  History of Anesthesia Complications (+) PONV  Airway Mallampati: I  TM Distance: >3 FB Neck ROM: Full    Dental  (+) Teeth Intact, Dental Advisory Given   Pulmonary  breath sounds clear to auscultation        Cardiovascular hypertension, Pt. on medications + CAD and + Past MI (20 years ago.  Has done well since) Rhythm:Regular Rate:Normal     Neuro/Psych    GI/Hepatic   Endo/Other    Renal/GU      Musculoskeletal   Abdominal   Peds  Hematology   Anesthesia Other Findings   Reproductive/Obstetrics                             Anesthesia Physical Anesthesia Plan  ASA: III  Anesthesia Plan: MAC   Post-op Pain Management:    Induction: Intravenous  Airway Management Planned: Simple Face Mask  Additional Equipment:   Intra-op Plan:   Post-operative Plan:   Informed Consent: I have reviewed the patients History and Physical, chart, labs and discussed the procedure including the risks, benefits and alternatives for the proposed anesthesia with the patient or authorized representative who has indicated his/her understanding and acceptance.   Dental advisory given  Plan Discussed with: CRNA, Anesthesiologist and Surgeon  Anesthesia Plan Comments:         Anesthesia Quick Evaluation

## 2014-04-07 NOTE — Discharge Instructions (Signed)
Apply eye cream twice daily Ice to face as able for next 24 hours   Post Anesthesia Home Care Instructions  Activity: Get plenty of rest for the remainder of the day. A responsible adult should stay with you for 24 hours following the procedure.  For the next 24 hours, DO NOT: -Drive a car -Paediatric nurse -Drink alcoholic beverages -Take any medication unless instructed by your physician -Make any legal decisions or sign important papers.  Meals: Start with liquid foods such as gelatin or soup. Progress to regular foods as tolerated. Avoid greasy, spicy, heavy foods. If nausea and/or vomiting occur, drink only clear liquids until the nausea and/or vomiting subsides. Call your physician if vomiting continues.  Special Instructions/Symptoms: Your throat may feel dry or sore from the anesthesia or the breathing tube placed in your throat during surgery. If this causes discomfort, gargle with warm salt water. The discomfort should disappear within 24 hours.

## 2014-04-07 NOTE — Brief Op Note (Signed)
04/07/2014  11:06 AM  PATIENT:  Shelly Obrien  80 y.o. 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  SURGEON:  Surgeon(s) and Role:    * Curator, DO - Primary  PHYSICIAN ASSISTANT: Shawn Rayburn, PA  ASSISTANTS: none   ANESTHESIA:   MAC  EBL:  Total I/O In: 500 [I.V.:500] Out: -   BLOOD ADMINISTERED:none  DRAINS: none   LOCAL MEDICATIONS USED:  LIDOCAINE   SPECIMEN:  Source of Specimen:  neck lesion  DISPOSITION OF SPECIMEN:  PATHOLOGY  COUNTS:  YES  TOURNIQUET:  * No tourniquets in log *  DICTATION: .Dragon Dictation  PLAN OF CARE: Discharge to home after PACU  PATIENT DISPOSITION:  PACU - hemodynamically stable.   Delay start of Pharmacological VTE agent (>24hrs) due to surgical blood loss or risk of bleeding: no

## 2014-04-08 ENCOUNTER — Encounter (HOSPITAL_BASED_OUTPATIENT_CLINIC_OR_DEPARTMENT_OTHER): Payer: Self-pay | Admitting: Plastic Surgery

## 2014-04-09 DIAGNOSIS — H02002 Unspecified entropion of right lower eyelid: Secondary | ICD-10-CM | POA: Diagnosis not present

## 2014-04-09 NOTE — Op Note (Signed)
Operative Note   DATE OF OPERATION: 04/09/2014  LOCATION: [facility]  SURGICAL DIVISION: Plastic Surgery  PREOPERATIVE DIAGNOSES:  Entropion and trichiasis of right lower lid  POSTOPERATIVE DIAGNOSES:  same  PROCEDURE:  Repair of entropion and trichiasis of right lower lid  SURGEON: Theodoro Kos, DO  ASSISTANT: Shawn Rayburn, PA  ANESTHESIA:  General.   COMPLICATIONS: None.   INDICATIONS FOR PROCEDURE:  The patient, Shelly Obrien is a 80 y.o. female born on 1923/02/17, is here for treatment of right lower lid entropion and trichiasis MRN: [id number]  CONSENT:  Informed consent was obtained directly from the patient. Risks, benefits and alternatives were fully discussed. Specific risks including but not limited to bleeding, infection, hematoma, seroma, scarring, pain, infection, contracture, asymmetry, wound healing problems, and need for further surgery were all discussed. The patient did have an ample opportunity to have questions answered to satisfaction.   DESCRIPTION OF PROCEDURE:  The patient was taken to the operating room. SCDs were placed and IV antibiotics were given. The patient's operative site was prepped and draped in a sterile fashion. A time out was performed and all information was confirmed to be correct.  MAC anesthesia was administered.  The right lower lid was marked and local injected.  After waiting several minutes for the local to take effect the #15 blade was used to make an incision.  The scissors were used to dissect to the retractors.  They were identified and the dissection was take to the infraorbital rim.  The bone elevator was used to free the midface soft tissue to enable superior repositioning.  The 3-0 Monocryl was used to reposition the fat pad and secure it to the medial aspect of the lateral orbital rim.  This provided excellent placement.  The retractors were then used to pull the lash line down and out without worsening the  entropion.  This was secured with 6-0 chromic.  The 4-0 Monocryl was then used to secure the orbicularis to the rim for additional security.  The incision was closed with 6-0 Monocryl vertical mattress sutures.  The corneal shield was removed that had been placed at the start of the case.  The patient tolerated the procedure well.  There were no complications. The patient was allowed to wake from anesthesia, extubated and taken to the recovery room in satisfactory condition.

## 2014-04-15 DIAGNOSIS — Z9849 Cataract extraction status, unspecified eye: Secondary | ICD-10-CM | POA: Diagnosis not present

## 2014-04-15 DIAGNOSIS — C4491 Basal cell carcinoma of skin, unspecified: Secondary | ICD-10-CM | POA: Diagnosis not present

## 2014-04-15 DIAGNOSIS — H3532 Exudative age-related macular degeneration: Secondary | ICD-10-CM | POA: Diagnosis not present

## 2014-04-28 DIAGNOSIS — H903 Sensorineural hearing loss, bilateral: Secondary | ICD-10-CM | POA: Diagnosis not present

## 2014-05-10 DIAGNOSIS — J069 Acute upper respiratory infection, unspecified: Secondary | ICD-10-CM | POA: Diagnosis not present

## 2014-06-17 DIAGNOSIS — H3532 Exudative age-related macular degeneration: Secondary | ICD-10-CM | POA: Diagnosis not present

## 2014-06-17 DIAGNOSIS — Z9849 Cataract extraction status, unspecified eye: Secondary | ICD-10-CM | POA: Diagnosis not present

## 2014-06-17 DIAGNOSIS — C4491 Basal cell carcinoma of skin, unspecified: Secondary | ICD-10-CM | POA: Diagnosis not present

## 2014-06-30 ENCOUNTER — Other Ambulatory Visit: Payer: Self-pay | Admitting: Dermatology

## 2014-06-30 DIAGNOSIS — C44722 Squamous cell carcinoma of skin of right lower limb, including hip: Secondary | ICD-10-CM | POA: Diagnosis not present

## 2014-06-30 DIAGNOSIS — L281 Prurigo nodularis: Secondary | ICD-10-CM | POA: Diagnosis not present

## 2014-07-22 DIAGNOSIS — H3532 Exudative age-related macular degeneration: Secondary | ICD-10-CM | POA: Diagnosis not present

## 2014-07-22 DIAGNOSIS — Z9849 Cataract extraction status, unspecified eye: Secondary | ICD-10-CM | POA: Diagnosis not present

## 2014-07-22 DIAGNOSIS — C4491 Basal cell carcinoma of skin, unspecified: Secondary | ICD-10-CM | POA: Diagnosis not present

## 2014-07-27 DIAGNOSIS — Z85828 Personal history of other malignant neoplasm of skin: Secondary | ICD-10-CM | POA: Diagnosis not present

## 2014-07-27 DIAGNOSIS — L905 Scar conditions and fibrosis of skin: Secondary | ICD-10-CM | POA: Diagnosis not present

## 2014-07-27 DIAGNOSIS — H02003 Unspecified entropion of right eye, unspecified eyelid: Secondary | ICD-10-CM | POA: Diagnosis not present

## 2014-08-17 DIAGNOSIS — I10 Essential (primary) hypertension: Secondary | ICD-10-CM | POA: Diagnosis not present

## 2014-08-17 DIAGNOSIS — Z0000 Encounter for general adult medical examination without abnormal findings: Secondary | ICD-10-CM | POA: Diagnosis not present

## 2014-08-17 DIAGNOSIS — D510 Vitamin B12 deficiency anemia due to intrinsic factor deficiency: Secondary | ICD-10-CM | POA: Diagnosis not present

## 2014-08-17 DIAGNOSIS — R7301 Impaired fasting glucose: Secondary | ICD-10-CM | POA: Diagnosis not present

## 2014-08-17 DIAGNOSIS — I252 Old myocardial infarction: Secondary | ICD-10-CM | POA: Diagnosis not present

## 2014-08-17 DIAGNOSIS — Z1389 Encounter for screening for other disorder: Secondary | ICD-10-CM | POA: Diagnosis not present

## 2014-08-17 DIAGNOSIS — I2510 Atherosclerotic heart disease of native coronary artery without angina pectoris: Secondary | ICD-10-CM | POA: Diagnosis not present

## 2014-08-17 DIAGNOSIS — E780 Pure hypercholesterolemia: Secondary | ICD-10-CM | POA: Diagnosis not present

## 2014-09-02 DIAGNOSIS — H3532 Exudative age-related macular degeneration: Secondary | ICD-10-CM | POA: Diagnosis not present

## 2014-09-02 DIAGNOSIS — Z9849 Cataract extraction status, unspecified eye: Secondary | ICD-10-CM | POA: Diagnosis not present

## 2014-09-02 DIAGNOSIS — C4491 Basal cell carcinoma of skin, unspecified: Secondary | ICD-10-CM | POA: Diagnosis not present

## 2014-09-09 DIAGNOSIS — M810 Age-related osteoporosis without current pathological fracture: Secondary | ICD-10-CM | POA: Diagnosis not present

## 2014-09-22 DIAGNOSIS — L905 Scar conditions and fibrosis of skin: Secondary | ICD-10-CM | POA: Diagnosis not present

## 2014-09-22 DIAGNOSIS — H02102 Unspecified ectropion of right lower eyelid: Secondary | ICD-10-CM | POA: Diagnosis not present

## 2014-09-29 DIAGNOSIS — C44722 Squamous cell carcinoma of skin of right lower limb, including hip: Secondary | ICD-10-CM | POA: Diagnosis not present

## 2014-09-30 DIAGNOSIS — H02102 Unspecified ectropion of right lower eyelid: Secondary | ICD-10-CM | POA: Diagnosis not present

## 2014-09-30 DIAGNOSIS — I444 Left anterior fascicular block: Secondary | ICD-10-CM | POA: Diagnosis not present

## 2014-09-30 DIAGNOSIS — H5010 Unspecified exotropia: Secondary | ICD-10-CM | POA: Diagnosis not present

## 2014-09-30 DIAGNOSIS — H02532 Eyelid retraction right lower eyelid: Secondary | ICD-10-CM | POA: Diagnosis not present

## 2014-10-05 DIAGNOSIS — Z7982 Long term (current) use of aspirin: Secondary | ICD-10-CM | POA: Diagnosis not present

## 2014-10-05 DIAGNOSIS — H02112 Cicatricial ectropion of right lower eyelid: Secondary | ICD-10-CM | POA: Diagnosis not present

## 2014-10-05 DIAGNOSIS — Z87891 Personal history of nicotine dependence: Secondary | ICD-10-CM | POA: Diagnosis not present

## 2014-10-05 DIAGNOSIS — H02003 Unspecified entropion of right eye, unspecified eyelid: Secondary | ICD-10-CM | POA: Diagnosis not present

## 2014-10-05 DIAGNOSIS — I2510 Atherosclerotic heart disease of native coronary artery without angina pectoris: Secondary | ICD-10-CM | POA: Diagnosis not present

## 2014-10-05 DIAGNOSIS — H02102 Unspecified ectropion of right lower eyelid: Secondary | ICD-10-CM | POA: Diagnosis not present

## 2014-10-05 DIAGNOSIS — L905 Scar conditions and fibrosis of skin: Secondary | ICD-10-CM | POA: Diagnosis not present

## 2014-10-05 DIAGNOSIS — I10 Essential (primary) hypertension: Secondary | ICD-10-CM | POA: Diagnosis not present

## 2014-10-05 DIAGNOSIS — I252 Old myocardial infarction: Secondary | ICD-10-CM | POA: Diagnosis not present

## 2014-10-05 DIAGNOSIS — Z85828 Personal history of other malignant neoplasm of skin: Secondary | ICD-10-CM | POA: Diagnosis not present

## 2014-10-05 DIAGNOSIS — E785 Hyperlipidemia, unspecified: Secondary | ICD-10-CM | POA: Diagnosis not present

## 2014-10-14 DIAGNOSIS — Z87891 Personal history of nicotine dependence: Secondary | ICD-10-CM | POA: Diagnosis not present

## 2014-10-14 DIAGNOSIS — H02102 Unspecified ectropion of right lower eyelid: Secondary | ICD-10-CM | POA: Diagnosis not present

## 2014-10-14 DIAGNOSIS — L905 Scar conditions and fibrosis of skin: Secondary | ICD-10-CM | POA: Diagnosis not present

## 2014-10-27 DIAGNOSIS — C44722 Squamous cell carcinoma of skin of right lower limb, including hip: Secondary | ICD-10-CM | POA: Diagnosis not present

## 2014-10-28 DIAGNOSIS — H3532 Exudative age-related macular degeneration: Secondary | ICD-10-CM | POA: Diagnosis not present

## 2014-10-28 DIAGNOSIS — Z9849 Cataract extraction status, unspecified eye: Secondary | ICD-10-CM | POA: Diagnosis not present

## 2014-10-28 DIAGNOSIS — C4491 Basal cell carcinoma of skin, unspecified: Secondary | ICD-10-CM | POA: Diagnosis not present

## 2014-11-04 DIAGNOSIS — H3532 Exudative age-related macular degeneration: Secondary | ICD-10-CM | POA: Diagnosis not present

## 2014-11-23 DIAGNOSIS — H02102 Unspecified ectropion of right lower eyelid: Secondary | ICD-10-CM | POA: Diagnosis not present

## 2014-11-23 DIAGNOSIS — I10 Essential (primary) hypertension: Secondary | ICD-10-CM | POA: Diagnosis not present

## 2014-11-23 DIAGNOSIS — I2510 Atherosclerotic heart disease of native coronary artery without angina pectoris: Secondary | ICD-10-CM | POA: Diagnosis not present

## 2014-11-23 DIAGNOSIS — Z880 Allergy status to penicillin: Secondary | ICD-10-CM | POA: Diagnosis not present

## 2014-11-23 DIAGNOSIS — Z9889 Other specified postprocedural states: Secondary | ICD-10-CM | POA: Diagnosis not present

## 2014-11-23 DIAGNOSIS — Z7982 Long term (current) use of aspirin: Secondary | ICD-10-CM | POA: Diagnosis not present

## 2014-11-23 DIAGNOSIS — Z882 Allergy status to sulfonamides status: Secondary | ICD-10-CM | POA: Diagnosis not present

## 2014-11-23 DIAGNOSIS — L905 Scar conditions and fibrosis of skin: Secondary | ICD-10-CM | POA: Diagnosis not present

## 2014-11-23 DIAGNOSIS — Z888 Allergy status to other drugs, medicaments and biological substances status: Secondary | ICD-10-CM | POA: Diagnosis not present

## 2014-11-23 DIAGNOSIS — Z87891 Personal history of nicotine dependence: Secondary | ICD-10-CM | POA: Diagnosis not present

## 2014-11-23 DIAGNOSIS — Z79899 Other long term (current) drug therapy: Secondary | ICD-10-CM | POA: Diagnosis not present

## 2014-11-29 DIAGNOSIS — Z1231 Encounter for screening mammogram for malignant neoplasm of breast: Secondary | ICD-10-CM | POA: Diagnosis not present

## 2014-12-21 DIAGNOSIS — H3532 Exudative age-related macular degeneration: Secondary | ICD-10-CM | POA: Diagnosis not present

## 2014-12-22 DIAGNOSIS — M1711 Unilateral primary osteoarthritis, right knee: Secondary | ICD-10-CM | POA: Diagnosis not present

## 2015-01-27 DIAGNOSIS — Z961 Presence of intraocular lens: Secondary | ICD-10-CM | POA: Diagnosis not present

## 2015-01-27 DIAGNOSIS — H3532 Exudative age-related macular degeneration: Secondary | ICD-10-CM | POA: Diagnosis not present

## 2015-01-27 DIAGNOSIS — Z9849 Cataract extraction status, unspecified eye: Secondary | ICD-10-CM | POA: Diagnosis not present

## 2015-01-27 DIAGNOSIS — C4491 Basal cell carcinoma of skin, unspecified: Secondary | ICD-10-CM | POA: Diagnosis not present

## 2015-02-17 ENCOUNTER — Encounter: Payer: Self-pay | Admitting: Internal Medicine

## 2015-02-17 DIAGNOSIS — I10 Essential (primary) hypertension: Secondary | ICD-10-CM | POA: Diagnosis not present

## 2015-02-17 DIAGNOSIS — M810 Age-related osteoporosis without current pathological fracture: Secondary | ICD-10-CM | POA: Diagnosis not present

## 2015-02-18 ENCOUNTER — Encounter: Payer: Self-pay | Admitting: Internal Medicine

## 2015-02-25 ENCOUNTER — Encounter: Payer: Self-pay | Admitting: Internal Medicine

## 2015-02-25 ENCOUNTER — Ambulatory Visit (INDEPENDENT_AMBULATORY_CARE_PROVIDER_SITE_OTHER): Payer: Medicare Other | Admitting: Internal Medicine

## 2015-02-25 VITALS — BP 156/92 | HR 67 | Ht 65.5 in | Wt 168.0 lb

## 2015-02-25 DIAGNOSIS — I252 Old myocardial infarction: Secondary | ICD-10-CM | POA: Diagnosis not present

## 2015-02-25 NOTE — Progress Notes (Signed)
Cardiology Office Note   Date:  02/25/2015   ID:  Shelly Obrien, DOB Oct 17, 1922, MRN [id number]  PCP:  Irven Shelling, MD  Cardiologist:   Dorris Carnes, MD   No chief complaint on file.     History of Present Illness: Shelly Obrien is a 81 y.o. female with a history ofCAD s/p remote PCI to LAD and HTN . Also history of dizziness Followed by ENT Has pill to take if develops problem  Patinet says breathing is fine Prior to angioplasty had CP and arm pain NO symptoms like that now    I saw the pt in Sept 2015   Last lippids in march 2016 LDL was 83  HDL was greater than 50   Since seen undergiong extensive eye surgery after a skin cancer rmoved  Breathing  Ok  No CP   Hhas been walking some   BP 150/80 in Dr Natalia Leatherwood office     Current Outpatient Prescriptions  Medication Sig Dispense Refill  . acetaminophen (TYLENOL) 500 MG tablet Take 1,000 mg by mouth every 6 (six) hours as needed. (PAIN)    . amLODipine (NORVASC) 5 MG tablet Take 5 mg by mouth every morning.     [name] aspirin 81 MG tablet Take 81 mg by mouth every morning.     [name] atenolol (TENORMIN) 25 MG tablet Take 25 mg by mouth daily.     . cyanocobalamin (V-R VITAMIN B-12) 500 MCG tablet Take 500 mcg by mouth daily.    . Fluticasone-Salmeterol (ADVAIR DISKUS) 100-50 MCG/DOSE AEPB Inhale 1 puff into the lungs daily as needed. (COLD SYMPTOMS)    . meclizine (ANTIVERT) 25 MG tablet Take 25 mg by mouth 3 (three) times daily as needed for dizziness.    . Multiple Vitamins-Minerals (ICAPS) CAPS Take 1 capsule by mouth every morning.     . nitroGLYCERIN (NITROSTAT) 0.4 MG SL tablet Place 0.4 mg under the tongue every 5 (five) minutes as needed for chest pain.     . polyvinyl alcohol (ARTIFICIAL TEARS) 1.4 % ophthalmic solution Place 1 drop into the right eye 2 (two) times daily.    [name] triamcinolone (NASACORT ALLERGY 24HR) 55 MCG/ACT AERO nasal inhaler Place 2 sprays into the nose daily as needed (allergies).     No  current facility-administered medications for this visit.    Allergies:   Antihistamines, chlorpheniramine-type; Penicillins; Contrast media; and Sulfonamide derivatives   Past Medical History  Diagnosis Date  . Coronary artery disease   . Hypertension   . Hyperlipidemia   . Myocardial infarct     hx of  . H/O: hysterectomy   . Hx of lumpectomy   . Wears glasses   . Complication of anesthesia     hard to wake   . PONV (postoperative nausea and vomiting)     Past Surgical History  Procedure Laterality Date  . Angioplasty    . Knee surgery      right  . Carpal tunnel release      left  . Dilation and curettage of uterus    . Abdominal hysterectomy    . Eye surgery      both cataracts  . Brow lift Right 04/07/2014    Procedure: REPAIR OF ENTROPION AND TRICHIASIS OF RIGHT LOWER EYE LID ;  Surgeon: Theodoro Kos, DO;  Location: [facility];  Service: Plastics;  Laterality: Right;     Social History:  The patient  reports that she has never smoked.  She has never used smokeless tobacco. She reports that she does not drink alcohol or use illicit drugs.   Family History:  The patient's family history includes Cancer in an other family member; Coronary artery disease in an other family member.    ROS:  Please see the history of present illness. All other systems are reviewed and  Negative to the above problem except as noted.    PHYSICAL EXAM: VS:  BP 156/92 mmHg  Pulse 67  Ht 5' 5.5" (1.664 m)  Wt 168 lb (76.204 kg)  BMI 27.52 kg/m2  GEN: Well nourished, well developed, in no acute distress HEENT: normal Neck: no JVD, carotid bruits, or masses Cardiac: RRR; no murmurs, rubs, or gallops,no edema  Respiratory:  clear to auscultation bilaterally, normal work of breathing GI: soft, nontender, nondistended, + BS  No hepatomegaly  MS: no deformity Moving all extremities   Skin: warm and dry, no rash Neuro:  Strength and sensation are intact Psych: euthymic  mood, full affect   EKG:  EKG is ordered today.  SR 67  Incomp RBBB    Lipid Panel No results found for: CHOL, TRIG, HDL, CHOLHDL, VLDL, LDLCALC, LDLDIRECT    Wt Readings from Last 3 Encounters:  02/25/15 168 lb (76.204 kg)  04/07/14 174 lb 6 oz (79.096 kg)  03/22/14 172 lb (78.019 kg)      ASSESSMENT AND PLAN:  1  HTN  BP on my check was 132/90   WIll keep the same reimgne    2. CAD  No CP  Continue to followe medically   3  HL  Lipids in march LDL was 83  Follow    F/U in 1 year  Signed, Dorris Carnes, MD  02/25/2015 12:15 PM    [facility] [place], [place], [place]  [address] Phone: [phone]; Fax: [phone]

## 2015-02-25 NOTE — Patient Instructions (Signed)
Medication Instructions:  Your physician recommends that you continue on your current medications as directed. Please refer to the Current Medication list given to you today.  Labwork: NONE ORDER TODAY    Testing/Procedures:  NONE ORDER TODAY    Follow-Up: Your physician wants you to follow-up in: [place] will receive a reminder letter in the mail two months in advance. If you don't receive a letter, please call our office to schedule the follow-up appointment.      Any Other Special Instructions Will Be Listed Below (If Applicable).

## 2015-03-10 DIAGNOSIS — H353290 Exudative age-related macular degeneration, unspecified eye, stage unspecified: Secondary | ICD-10-CM | POA: Diagnosis not present

## 2015-03-10 DIAGNOSIS — H353220 Exudative age-related macular degeneration, left eye, stage unspecified: Secondary | ICD-10-CM | POA: Diagnosis not present

## 2015-03-23 DIAGNOSIS — Z23 Encounter for immunization: Secondary | ICD-10-CM | POA: Diagnosis not present

## 2015-04-18 DIAGNOSIS — Z961 Presence of intraocular lens: Secondary | ICD-10-CM | POA: Diagnosis not present

## 2015-04-21 DIAGNOSIS — H353231 Exudative age-related macular degeneration, bilateral, with active choroidal neovascularization: Secondary | ICD-10-CM | POA: Diagnosis not present

## 2015-05-05 DIAGNOSIS — H353231 Exudative age-related macular degeneration, bilateral, with active choroidal neovascularization: Secondary | ICD-10-CM | POA: Diagnosis not present

## 2015-05-09 DIAGNOSIS — H353211 Exudative age-related macular degeneration, right eye, with active choroidal neovascularization: Secondary | ICD-10-CM | POA: Diagnosis not present

## 2015-05-26 DIAGNOSIS — H353220 Exudative age-related macular degeneration, left eye, stage unspecified: Secondary | ICD-10-CM | POA: Diagnosis not present

## 2015-06-01 DIAGNOSIS — H353211 Exudative age-related macular degeneration, right eye, with active choroidal neovascularization: Secondary | ICD-10-CM | POA: Diagnosis not present

## 2015-06-10 DIAGNOSIS — J069 Acute upper respiratory infection, unspecified: Secondary | ICD-10-CM | POA: Diagnosis not present

## 2015-06-23 DIAGNOSIS — H353231 Exudative age-related macular degeneration, bilateral, with active choroidal neovascularization: Secondary | ICD-10-CM | POA: Diagnosis not present

## 2015-07-21 DIAGNOSIS — H353231 Exudative age-related macular degeneration, bilateral, with active choroidal neovascularization: Secondary | ICD-10-CM | POA: Diagnosis not present

## 2015-07-31 ENCOUNTER — Encounter (HOSPITAL_COMMUNITY): Payer: Self-pay

## 2015-07-31 ENCOUNTER — Emergency Department (HOSPITAL_COMMUNITY)
Admission: EM | Admit: 2015-07-31 | Discharge: 2015-07-31 | Disposition: A | Discharge status: Home / Self Care | Payer: Medicare Other | Attending: Emergency Medicine | Admitting: Emergency Medicine

## 2015-07-31 ENCOUNTER — Emergency Department (HOSPITAL_COMMUNITY): Payer: Medicare Other

## 2015-07-31 DIAGNOSIS — Z973 Presence of spectacles and contact lenses: Secondary | ICD-10-CM | POA: Diagnosis not present

## 2015-07-31 DIAGNOSIS — Z9889 Other specified postprocedural states: Secondary | ICD-10-CM | POA: Insufficient documentation

## 2015-07-31 DIAGNOSIS — Z880 Allergy status to penicillin: Secondary | ICD-10-CM | POA: Insufficient documentation

## 2015-07-31 DIAGNOSIS — Z79899 Other long term (current) drug therapy: Secondary | ICD-10-CM | POA: Diagnosis not present

## 2015-07-31 DIAGNOSIS — R1011 Right upper quadrant pain: Secondary | ICD-10-CM | POA: Diagnosis present

## 2015-07-31 DIAGNOSIS — I252 Old myocardial infarction: Secondary | ICD-10-CM | POA: Diagnosis not present

## 2015-07-31 DIAGNOSIS — I2510 Atherosclerotic heart disease of native coronary artery without angina pectoris: Secondary | ICD-10-CM | POA: Diagnosis not present

## 2015-07-31 DIAGNOSIS — Z7982 Long term (current) use of aspirin: Secondary | ICD-10-CM | POA: Insufficient documentation

## 2015-07-31 DIAGNOSIS — Z9861 Coronary angioplasty status: Secondary | ICD-10-CM | POA: Insufficient documentation

## 2015-07-31 DIAGNOSIS — I10 Essential (primary) hypertension: Secondary | ICD-10-CM | POA: Insufficient documentation

## 2015-07-31 DIAGNOSIS — R109 Unspecified abdominal pain: Secondary | ICD-10-CM

## 2015-07-31 DIAGNOSIS — N3000 Acute cystitis without hematuria: Secondary | ICD-10-CM | POA: Diagnosis not present

## 2015-07-31 DIAGNOSIS — N200 Calculus of kidney: Secondary | ICD-10-CM | POA: Diagnosis not present

## 2015-07-31 DIAGNOSIS — Z8639 Personal history of other endocrine, nutritional and metabolic disease: Secondary | ICD-10-CM | POA: Insufficient documentation

## 2015-07-31 DIAGNOSIS — R1030 Lower abdominal pain, unspecified: Secondary | ICD-10-CM | POA: Diagnosis not present

## 2015-07-31 LAB — URINE MICROSCOPIC-ADD ON

## 2015-07-31 LAB — URINALYSIS, ROUTINE W REFLEX MICROSCOPIC
Bilirubin Urine: NEGATIVE
Glucose, UA: 100 mg/dL — AB
Ketones, ur: NEGATIVE mg/dL
Nitrite: NEGATIVE
Protein, ur: NEGATIVE mg/dL
Specific Gravity, Urine: 1.018 (ref 1.005–1.030)
pH: 5.5 (ref 5.0–8.0)

## 2015-07-31 MED ORDER — VALACYCLOVIR HCL 1 G PO TABS: 1000.0000 mg | ORAL_TABLET | Freq: Three times a day (TID) | ORAL | Status: AC

## 2015-07-31 MED ORDER — HYDROCODONE-ACETAMINOPHEN 5-325 MG PO TABS: 1.0000 | ORAL_TABLET | Freq: Four times a day (QID) | ORAL | Status: DC | PRN

## 2015-07-31 MED ORDER — CIPROFLOXACIN HCL 500 MG PO TABS: 500.0000 mg | ORAL_TABLET | Freq: Two times a day (BID) | ORAL | Status: DC

## 2015-07-31 NOTE — ED Notes (Signed)
Pt with back pain x 2 days.  Does not recall injury.  Feels like a burning.  Not sleeping

## 2015-07-31 NOTE — ED Provider Notes (Signed)
CSN: [id number]     Arrival date & time 07/31/15  0727 History   First MD Initiated Contact with Patient 07/31/15 [phone]     Chief Complaint  Patient presents with  . Back Pain     (Consider location/radiation/quality/duration/timing/severity/associated sxs/prior Treatment) HPI Comments: Patient is a 82 year old female with past medical history of hypertension, coronary artery disease, high cholesterol. She presents for evaluation of left flank pain that started 2 days ago in the absence of any injury or trauma. Patient states that she was recently vacationing at the beach, however denies any falls or having performed any new activities that would've led to this. She describes the pain as a "burning". Her pain is worse when she lies flat but is relieved with standing up and walking upright. She denies any urinary complaints. She denies any fevers or chills.  Patient is a 82 y.o. female presenting with back pain. The history is provided by the patient.  Back Pain Location:  Lumbar spine (Left flank) Quality:  Burning Radiates to: Right upper abdomen. Pain severity:  Moderate Duration:  2 days Timing:  Constant Progression:  Unchanged Chronicity:  New Context: not falling and not recent injury   Relieved by: Ambulation and standing upright. Worsened by:  Lying down and movement Ineffective treatments:  None tried Associated symptoms: no abdominal pain, no fever, no paresthesias and no weakness     Past Medical History  Diagnosis Date  . Coronary artery disease   . Hypertension   . Hyperlipidemia   . Myocardial infarct (HCC)     hx of  . H/O: hysterectomy   . Hx of lumpectomy   . Wears glasses   . Complication of anesthesia     hard to wake   . PONV (postoperative nausea and vomiting)    Past Surgical History  Procedure Laterality Date  . Angioplasty    . Knee surgery      right  . Carpal tunnel release      left  . Dilation and curettage of uterus    . Abdominal  hysterectomy    . Eye surgery      both cataracts  . Brow lift Right 04/07/2014    Procedure: REPAIR OF ENTROPION AND TRICHIASIS OF RIGHT LOWER EYE LID ;  Surgeon: Theodoro Kos, DO;  Location: [facility];  Service: Plastics;  Laterality: Right;   Family History  Problem Relation Age of Onset  . Coronary artery disease      positive family hx of  . Cancer      family hx of   Social History  Substance Use Topics  . Smoking status: Never Smoker   . Smokeless tobacco: Never Used  . Alcohol Use: No   OB History    No data available     Review of Systems  Constitutional: Negative for fever.  Gastrointestinal: Negative for abdominal pain.  Musculoskeletal: Positive for back pain.  Neurological: Negative for weakness and paresthesias.  All other systems reviewed and are negative.     Allergies  Antihistamines, chlorpheniramine-type; Penicillins; Contrast media; and Sulfonamide derivatives  Home Medications   Prior to Admission medications   Medication Sig Start Date End Date Taking? Authorizing Provider  acetaminophen (TYLENOL) 500 MG tablet Take 1,000 mg by mouth every 6 (six) hours as needed. (PAIN)    Historical Provider, MD  amLODipine (NORVASC) 5 MG tablet Take 5 mg by mouth every morning.     Historical Provider, MD  aspirin 81 MG  tablet Take 81 mg by mouth every morning.     Historical Provider, MD  atenolol (TENORMIN) 25 MG tablet Take 25 mg by mouth daily.     Historical Provider, MD  cyanocobalamin (V-R VITAMIN B-12) 500 MCG tablet Take 500 mcg by mouth daily.    Historical Provider, MD  Fluticasone-Salmeterol (ADVAIR DISKUS) 100-50 MCG/DOSE AEPB Inhale 1 puff into the lungs daily as needed. (COLD SYMPTOMS) 06/02/13   Historical Provider, MD  meclizine (ANTIVERT) 25 MG tablet Take 25 mg by mouth 3 (three) times daily as needed for dizziness.    Historical Provider, MD  Multiple Vitamins-Minerals (ICAPS) CAPS Take 1 capsule by mouth every morning.      Historical Provider, MD  nitroGLYCERIN (NITROSTAT) 0.4 MG SL tablet Place 0.4 mg under the tongue every 5 (five) minutes as needed for chest pain.     Historical Provider, MD  polyvinyl alcohol (ARTIFICIAL TEARS) 1.4 % ophthalmic solution Place 1 drop into the right eye 2 (two) times daily.    Historical Provider, MD  triamcinolone (NASACORT ALLERGY 24HR) 55 MCG/ACT AERO nasal inhaler Place 2 sprays into the nose daily as needed (allergies).    Historical Provider, MD   BP 156/95 mmHg  Pulse 72  Temp(Src) 97.7 F (36.5 C) (Oral)  Resp 18  SpO2 98% Physical Exam  Constitutional: She is oriented to person, place, and time. She appears well-developed and well-nourished. No distress.  HENT:  Head: Normocephalic and atraumatic.  Neck: Normal range of motion. Neck supple.  Cardiovascular: Normal rate and regular rhythm.  Exam reveals no gallop and no friction rub.   No murmur heard. Pulmonary/Chest: Effort normal and breath sounds normal. No respiratory distress. She has no wheezes.  Abdominal: Soft. Bowel sounds are normal. She exhibits no distension. There is no tenderness.  Musculoskeletal: Normal range of motion. She exhibits no edema.  Neurological: She is alert and oriented to person, place, and time. She exhibits normal muscle tone. Coordination normal.  Strength is 5 out of 5 in both lower extremities. She is able to ambulate without difficulty.  Skin: Skin is warm and dry. She is not diaphoretic.  Nursing note and vitals reviewed.   ED Course  Procedures (including critical care time) Labs Review Labs Reviewed  URINALYSIS, ROUTINE W REFLEX MICROSCOPIC (NOT AT [facility])    Imaging Review No results found. I have personally reviewed and evaluated these images and lab results as part of my medical decision-making.   MDM   Final diagnoses:  None   Patient is a 82 year old female who presents with complaints of left flank pain for the past 2 days. On the differential are  pyelonephritis, renal calculus, AAA, compression fracture, and possibly undeclared herpes zoster. Her workup reveals a urinalysis showing moderate leukocytes. This will be treated with Keflex. Workup also shows no significant abnormality on her CT scan. Specifically there is no evidence for renal calculus, AAA, compression fracture, or other acute pathology. She appears comfortable and is declining pain medication in the ER. She would like something to take at home if her pain worsens. She will be prescribed the above-mentioned antibiotic, hydrocodone, and I will also provide a prescription for Valtrex which she is to fill if a rash around his on her flank in the area of the pain.    Veryl Speak, MD 07/31/15 [phone]

## 2015-07-31 NOTE — Discharge Instructions (Signed)
Cipro as prescribed.  Hydrocodone as prescribed as needed for pain.  Fill the prescription for valacyclovir if you develop a rash on your flank in the area of your pain.  Return to the emergency department if your symptoms significantly worsen or change.   Flank Pain Flank pain refers to pain that is located on the side of the body between the upper abdomen and the back. The pain may occur over a short period of time (acute) or may be long-term or reoccurring (chronic). It may be mild or severe. Flank pain can be caused by many things. CAUSES  Some of the more common causes of flank pain include:  Muscle strains.   Muscle spasms.   A disease of your spine (vertebral disk disease).   A lung infection (pneumonia).   Fluid around your lungs (pulmonary edema).   A kidney infection.   Kidney stones.   A very painful skin rash caused by the chickenpox virus (shingles).   Gallbladder disease.  [place] care will depend on the cause of your pain. In general,  Rest as directed by your caregiver.  Drink enough fluids to keep your urine clear or pale yellow.  Only take over-the-counter or prescription medicines as directed by your caregiver. Some medicines may help relieve the pain.  Tell your caregiver about any changes in your pain.  Follow up with your caregiver as directed. SEEK IMMEDIATE MEDICAL CARE IF:   Your pain is not controlled with medicine.   You have new or worsening symptoms.  Your pain increases.   You have abdominal pain.   You have shortness of breath.   You have persistent nausea or vomiting.   You have swelling in your abdomen.   You feel faint or pass out.   You have blood in your urine.  You have a fever or persistent symptoms for more than 2-3 days.  You have a fever and your symptoms suddenly get worse. MAKE SURE YOU:   Understand these instructions.  Will watch your condition.  Will get help  right away if you are not doing well or get worse.   This information is not intended to replace advice given to you by your health care provider. Make sure you discuss any questions you have with your health care provider.   Document Released: 07/12/2005 Document Revised: 02/13/2012 Document Reviewed: 01/03/2012 Elsevier Interactive Patient Education [employer].

## 2015-08-03 DIAGNOSIS — R079 Chest pain, unspecified: Secondary | ICD-10-CM | POA: Diagnosis not present

## 2015-08-25 DIAGNOSIS — H353211 Exudative age-related macular degeneration, right eye, with active choroidal neovascularization: Secondary | ICD-10-CM | POA: Diagnosis not present

## 2015-08-30 DIAGNOSIS — D510 Vitamin B12 deficiency anemia due to intrinsic factor deficiency: Secondary | ICD-10-CM | POA: Diagnosis not present

## 2015-08-30 DIAGNOSIS — Z1389 Encounter for screening for other disorder: Secondary | ICD-10-CM | POA: Diagnosis not present

## 2015-08-30 DIAGNOSIS — I2510 Atherosclerotic heart disease of native coronary artery without angina pectoris: Secondary | ICD-10-CM | POA: Diagnosis not present

## 2015-08-30 DIAGNOSIS — E7800 Pure hypercholesterolemia, unspecified: Secondary | ICD-10-CM | POA: Diagnosis not present

## 2015-08-30 DIAGNOSIS — R7301 Impaired fasting glucose: Secondary | ICD-10-CM | POA: Diagnosis not present

## 2015-08-30 DIAGNOSIS — I10 Essential (primary) hypertension: Secondary | ICD-10-CM | POA: Diagnosis not present

## 2015-09-22 DIAGNOSIS — H02102 Unspecified ectropion of right lower eyelid: Secondary | ICD-10-CM | POA: Diagnosis not present

## 2015-09-22 DIAGNOSIS — Z9849 Cataract extraction status, unspecified eye: Secondary | ICD-10-CM | POA: Diagnosis not present

## 2015-09-22 DIAGNOSIS — Z961 Presence of intraocular lens: Secondary | ICD-10-CM | POA: Diagnosis not present

## 2015-09-22 DIAGNOSIS — H26492 Other secondary cataract, left eye: Secondary | ICD-10-CM | POA: Diagnosis not present

## 2015-09-22 DIAGNOSIS — H353221 Exudative age-related macular degeneration, left eye, with active choroidal neovascularization: Secondary | ICD-10-CM | POA: Diagnosis not present

## 2015-09-25 ENCOUNTER — Emergency Department (HOSPITAL_COMMUNITY): Payer: Medicare Other

## 2015-09-25 ENCOUNTER — Emergency Department (HOSPITAL_COMMUNITY)
Admission: EM | Admit: 2015-09-25 | Discharge: 2015-09-25 | Disposition: A | Discharge status: Home / Self Care | Payer: Medicare Other | Attending: Emergency Medicine | Admitting: Emergency Medicine

## 2015-09-25 ENCOUNTER — Encounter (HOSPITAL_COMMUNITY): Payer: Self-pay | Admitting: *Deleted

## 2015-09-25 DIAGNOSIS — Z880 Allergy status to penicillin: Secondary | ICD-10-CM | POA: Diagnosis not present

## 2015-09-25 DIAGNOSIS — N3000 Acute cystitis without hematuria: Secondary | ICD-10-CM | POA: Insufficient documentation

## 2015-09-25 DIAGNOSIS — J45998 Other asthma: Secondary | ICD-10-CM | POA: Diagnosis not present

## 2015-09-25 DIAGNOSIS — Z7982 Long term (current) use of aspirin: Secondary | ICD-10-CM | POA: Diagnosis not present

## 2015-09-25 DIAGNOSIS — Z8639 Personal history of other endocrine, nutritional and metabolic disease: Secondary | ICD-10-CM | POA: Insufficient documentation

## 2015-09-25 DIAGNOSIS — J4521 Mild intermittent asthma with (acute) exacerbation: Secondary | ICD-10-CM | POA: Diagnosis not present

## 2015-09-25 DIAGNOSIS — J209 Acute bronchitis, unspecified: Secondary | ICD-10-CM | POA: Diagnosis not present

## 2015-09-25 DIAGNOSIS — Z7951 Long term (current) use of inhaled steroids: Secondary | ICD-10-CM | POA: Insufficient documentation

## 2015-09-25 DIAGNOSIS — Z79899 Other long term (current) drug therapy: Secondary | ICD-10-CM | POA: Insufficient documentation

## 2015-09-25 DIAGNOSIS — I252 Old myocardial infarction: Secondary | ICD-10-CM | POA: Diagnosis not present

## 2015-09-25 DIAGNOSIS — I2510 Atherosclerotic heart disease of native coronary artery without angina pectoris: Secondary | ICD-10-CM | POA: Insufficient documentation

## 2015-09-25 DIAGNOSIS — Z9861 Coronary angioplasty status: Secondary | ICD-10-CM | POA: Insufficient documentation

## 2015-09-25 DIAGNOSIS — I10 Essential (primary) hypertension: Secondary | ICD-10-CM | POA: Diagnosis not present

## 2015-09-25 DIAGNOSIS — M47812 Spondylosis without myelopathy or radiculopathy, cervical region: Secondary | ICD-10-CM | POA: Diagnosis not present

## 2015-09-25 DIAGNOSIS — R0602 Shortness of breath: Secondary | ICD-10-CM | POA: Diagnosis present

## 2015-09-25 LAB — CBC WITH DIFFERENTIAL/PLATELET
Basophils Absolute: 0 10*3/uL (ref 0.0–0.1)
Basophils Relative: 0 %
Eosinophils Absolute: 0.5 10*3/uL (ref 0.0–0.7)
Eosinophils Relative: 8 %
HCT: 43.6 % (ref 36.0–46.0)
Hemoglobin: 14.4 g/dL (ref 12.0–15.0)
Lymphocytes Relative: 27 %
Lymphs Abs: 1.6 10*3/uL (ref 0.7–4.0)
MCH: 31.3 pg (ref 26.0–34.0)
MCHC: 33 g/dL (ref 30.0–36.0)
MCV: 94.8 fL (ref 78.0–100.0)
Monocytes Absolute: 0.6 10*3/uL (ref 0.1–1.0)
Monocytes Relative: 10 %
Neutro Abs: 3.3 10*3/uL (ref 1.7–7.7)
Neutrophils Relative %: 55 %
Platelets: 203 10*3/uL (ref 150–400)
RBC: 4.6 MIL/uL (ref 3.87–5.11)
RDW: 13.9 % (ref 11.5–15.5)
WBC: 6 10*3/uL (ref 4.0–10.5)

## 2015-09-25 LAB — URINALYSIS, ROUTINE W REFLEX MICROSCOPIC
Bilirubin Urine: NEGATIVE
Glucose, UA: NEGATIVE mg/dL
Hgb urine dipstick: NEGATIVE
Ketones, ur: NEGATIVE mg/dL
Nitrite: NEGATIVE
Protein, ur: NEGATIVE mg/dL
Specific Gravity, Urine: 1.021 (ref 1.005–1.030)
pH: 5.5 (ref 5.0–8.0)

## 2015-09-25 LAB — COMPREHENSIVE METABOLIC PANEL
ALT: 15 U/L (ref 14–54)
AST: 23 U/L (ref 15–41)
Albumin: 3.8 g/dL (ref 3.5–5.0)
Alkaline Phosphatase: 98 U/L (ref 38–126)
Anion gap: 11 (ref 5–15)
BUN: 18 mg/dL (ref 6–20)
CO2: 22 mmol/L (ref 22–32)
Calcium: 9.6 mg/dL (ref 8.9–10.3)
Chloride: 107 mmol/L (ref 101–111)
Creatinine, Ser: 1 mg/dL (ref 0.44–1.00)
GFR calc Af Amer: 55 mL/min — ABNORMAL LOW (ref >60)
GFR calc non Af Amer: 48 mL/min — ABNORMAL LOW (ref >60)
Glucose, Bld: 204 mg/dL — ABNORMAL HIGH (ref 65–99)
Potassium: 3.9 mmol/L (ref 3.5–5.1)
Sodium: 140 mmol/L (ref 135–145)
Total Bilirubin: 1.2 mg/dL (ref 0.3–1.2)
Total Protein: 7.1 g/dL (ref 6.5–8.1)

## 2015-09-25 LAB — URINE MICROSCOPIC-ADD ON: RBC / HPF: NONE SEEN RBC/hpf (ref 0–5)

## 2015-09-25 LAB — LACTIC ACID, PLASMA: Lactic Acid, Venous: 3.7 mmol/L (ref 0.5–2.0)

## 2015-09-25 MED ORDER — ALBUTEROL SULFATE HFA 108 (90 BASE) MCG/ACT IN AERS
1.0000 | INHALATION_SPRAY | RESPIRATORY_TRACT | Status: DC
  Administered 2015-09-25: 1 via RESPIRATORY_TRACT
  Filled 2015-09-25: qty 6.7

## 2015-09-25 MED ORDER — LEVOFLOXACIN 500 MG PO TABS: 500.0000 mg | ORAL_TABLET | Freq: Every day | ORAL | Status: DC

## 2015-09-25 MED ORDER — METHYLPREDNISOLONE SODIUM SUCC 125 MG IJ SOLR
125.0000 mg | Freq: Once | INTRAMUSCULAR | Status: AC
  Administered 2015-09-25: 125 mg via INTRAVENOUS
  Filled 2015-09-25: qty 2

## 2015-09-25 MED ORDER — LEVOFLOXACIN 500 MG PO TABS
500.0000 mg | ORAL_TABLET | Freq: Once | ORAL | Status: AC
  Administered 2015-09-25: 500 mg via ORAL
  Filled 2015-09-25: qty 1

## 2015-09-25 MED ORDER — PREDNISONE 10 MG (21) PO TBPK: 10.0000 mg | ORAL_TABLET | Freq: Every day | ORAL | Status: DC

## 2015-09-25 MED ORDER — SODIUM CHLORIDE 0.9 % IV BOLUS (SEPSIS)
1000.0000 mL | Freq: Once | INTRAVENOUS | Status: AC
  Administered 2015-09-25: 1000 mL via INTRAVENOUS

## 2015-09-25 MED ORDER — IPRATROPIUM-ALBUTEROL 0.5-2.5 (3) MG/3ML IN SOLN
3.0000 mL | Freq: Once | RESPIRATORY_TRACT | Status: AC
  Administered 2015-09-25: 3 mL via RESPIRATORY_TRACT
  Filled 2015-09-25: qty 3

## 2015-09-25 NOTE — Discharge Instructions (Signed)

## 2015-09-25 NOTE — ED Notes (Signed)
Pt to CT

## 2015-09-25 NOTE — ED Notes (Signed)
Patient transported to X-ray 

## 2015-09-25 NOTE — ED Provider Notes (Signed)
CSN: [id number]     Arrival date & time 09/25/15  [id number] History   First MD Initiated Contact with Patient 09/25/15 1007     Chief Complaint  Patient presents with  . Shortness of Breath   PT HAS HAD A COLD FOR ABOUT A WEEK.  SHE FEELS THAT IT HAS MOVED INTO HER CHEST AND NOW SHE HAS A PRODUCTIVE COUGH AND SOB.  THE PT SAID THAT HER NECK IS HURTING AND SHE CAN'T LIFT UP HER ARMS LIKE NL.    (Consider location/radiation/quality/duration/timing/severity/associated sxs/prior Treatment) Patient is a 82 y.o. female presenting with shortness of breath. The history is provided by the patient.  Shortness of Breath Severity:  Mild Onset quality:  Gradual Timing:  Constant Progression:  Worsening Associated symptoms: cough and sputum production     Past Medical History  Diagnosis Date  . Coronary artery disease   . Hypertension   . Hyperlipidemia   . Myocardial infarct (HCC)     hx of  . H/O: hysterectomy   . Hx of lumpectomy   . Wears glasses   . Complication of anesthesia     hard to wake   . PONV (postoperative nausea and vomiting)    Past Surgical History  Procedure Laterality Date  . Angioplasty    . Knee surgery      right  . Carpal tunnel release      left  . Dilation and curettage of uterus    . Abdominal hysterectomy    . Eye surgery      both cataracts  . Brow lift Right 04/07/2014    Procedure: REPAIR OF ENTROPION AND TRICHIASIS OF RIGHT LOWER EYE LID ;  Surgeon: Theodoro Kos, DO;  Location: [facility];  Service: Plastics;  Laterality: Right;   Family History  Problem Relation Age of Onset  . Coronary artery disease      positive family hx of  . Cancer      family hx of   Social History  Substance Use Topics  . Smoking status: Never Smoker   . Smokeless tobacco: Never Used  . Alcohol Use: No   OB History    No data available     Review of Systems  Respiratory: Positive for cough, sputum production and shortness of breath.   All other  systems reviewed and are negative.     Allergies  Antihistamines, chlorpheniramine-type; Penicillins; Contrast media; Sulfonamide derivatives; and Pheniramine  Home Medications   Prior to Admission medications   Medication Sig Start Date End Date Taking? Authorizing Provider  amLODipine (NORVASC) 5 MG tablet Take 5 mg by mouth every morning.    Yes Historical Provider, MD  aspirin 81 MG tablet Take 81 mg by mouth every morning.    Yes Historical Provider, MD  atenolol (TENORMIN) 25 MG tablet Take 25 mg by mouth daily.    Yes Historical Provider, MD  cyanocobalamin (V-R VITAMIN B-12) 500 MCG tablet Take 1,000 mcg by mouth every Monday, Wednesday, and Friday.    Yes Historical Provider, MD  Fluticasone-Salmeterol (ADVAIR DISKUS) 100-50 MCG/DOSE AEPB Inhale 1 puff into the lungs 2 (two) times daily as needed. (COLD SYMPTOMS) 06/02/13  Yes Historical Provider, MD  Multiple Vitamins-Minerals (ICAPS) CAPS Take 1 capsule by mouth every morning.    Yes Historical Provider, MD  polyvinyl alcohol (ARTIFICIAL TEARS) 1.4 % ophthalmic solution Place 1 drop into the right eye 2 (two) times daily.   Yes Historical Provider, MD  triamcinolone (NASACORT ALLERGY 24HR)  55 MCG/ACT AERO nasal inhaler Place 2 sprays into the nose daily as needed (allergies).   Yes Historical Provider, MD  ciprofloxacin (CIPRO) 500 MG tablet Take 1 tablet (500 mg total) by mouth 2 (two) times daily. Patient not taking: Reported on 09/25/2015 07/31/15   Veryl Speak, MD  HYDROcodone-acetaminophen (NORCO) 5-325 MG tablet Take 1 tablet by mouth every 6 (six) hours as needed. Patient not taking: Reported on 09/25/2015 07/31/15   Veryl Speak, MD  nitroGLYCERIN (NITROSTAT) 0.4 MG SL tablet Place 0.4 mg under the tongue every 5 (five) minutes as needed for chest pain.     Historical Provider, MD   BP 149/86 mmHg  Pulse 73  Temp(Src) 98.7 F (37.1 C) (Oral)  Resp 20  Ht 5\' 6"  (1.676 m)  Wt 160 lb (72.576 kg)  BMI 25.84 kg/m2  SpO2  96% Physical Exam  Constitutional: She is oriented to person, place, and time. She appears well-developed and well-nourished.  HENT:  Head: Normocephalic and atraumatic.  Eyes:  RIGHT EYE WITH HX OF CANCER AND MACULAR DEGENERATION.  CHRONIC EYE LID DEFORMITY SECONDARY TO SURGERY AND CHRONIC REDNESS SECONDARY TO INJECTIONS AND SURGERY.  Neck: Normal range of motion. Neck supple.  Cardiovascular: Normal rate, regular rhythm and normal heart sounds.   Pulmonary/Chest: Effort normal and breath sounds normal.  Abdominal: Soft. Bowel sounds are normal.  Musculoskeletal: Normal range of motion.  Neurological: She is alert and oriented to person, place, and time.  Skin: Skin is warm and dry.  Psychiatric: She has a normal mood and affect.  Nursing note and vitals reviewed.   ED Course  Procedures (including critical care time) Labs Review Labs Reviewed  COMPREHENSIVE METABOLIC PANEL - Abnormal; Notable for the following:    Glucose, Bld 204 (*)    GFR calc non Af Amer 48 (*)    GFR calc Af Amer 55 (*)    All other components within normal limits  LACTIC ACID, PLASMA - Abnormal; Notable for the following:    Lactic Acid, Venous 3.7 (*)    All other components within normal limits  CBC WITH DIFFERENTIAL/PLATELET  URINALYSIS, ROUTINE W REFLEX MICROSCOPIC (NOT AT [facility])  LACTIC ACID, PLASMA    Imaging Review Dg Chest 2 View  09/25/2015  CLINICAL DATA:  Left side neck pain x 2 week with no injury; pain radiates to left arm; no hx neck injury; SOB x about 3 weeks; hx CAD; HTN; non smoker EXAM: CHEST  2 VIEW COMPARISON:  05/17/2013 FINDINGS: The heart size and mediastinal contours are within normal limits. Both lungs are clear. Mild degenerative changes are seen in the mid thoracic spine. Chronic changes in the left shoulder are consistent with chronic rotator cuff injury. IMPRESSION: No evidence for acute  abnormality. Electronically Signed   By: Nolon Nations M.D.   On: 09/25/2015 11:05    Dg Cervical Spine Complete  09/25/2015  CLINICAL DATA:  Left side neck pain x 2 week with no injury; pain radiates to left arm; no hx neck injury; SOB x about 3 weeks; hx CAD; HTN EXAM: CERVICAL SPINE - COMPLETE 4+ VIEW COMPARISON:  None. FINDINGS: Significant degenerative changes are identified in the mid cervical spine, with significant facet hypertrophy noted in the mid levels. There is bilateral foraminal narrowing at C3-4, C4-5, C5-6. No evidence for acute fracture or subluxation. Alignment is normal. Prevertebral soft tissues have a normal appearance. Lung apices are clear. Bilateral carotid bulb calcification noted. IMPRESSION: Moderate mid cervical degenerative change. No evidence  for acute  abnormality. Electronically Signed   By: Nolon Nations M.D.   On: 09/25/2015 11:04   I have personally reviewed and evaluated these images and lab results as part of my medical decision-making.   EKG Interpretation None      MDM   Final diagnoses:  None    ACUTE BRONCHITIS, UTI, CERVICAL DEGENERATIVE CHANGES, RAD    Isla Pence, MD 09/28/15 1538

## 2015-09-25 NOTE — ED Notes (Signed)
Pt c/o " cold " x 2 days with proc cough and SOB.

## 2015-09-25 NOTE — ED Notes (Signed)
Pt cannot use restroom at this time, aware urine specimen is needed.  

## 2015-10-04 DIAGNOSIS — H9191 Unspecified hearing loss, right ear: Secondary | ICD-10-CM | POA: Diagnosis not present

## 2015-10-04 DIAGNOSIS — H539 Unspecified visual disturbance: Secondary | ICD-10-CM | POA: Diagnosis not present

## 2015-10-04 DIAGNOSIS — J209 Acute bronchitis, unspecified: Secondary | ICD-10-CM | POA: Diagnosis not present

## 2015-10-05 DIAGNOSIS — H6121 Impacted cerumen, right ear: Secondary | ICD-10-CM | POA: Diagnosis not present

## 2015-10-20 DIAGNOSIS — H353231 Exudative age-related macular degeneration, bilateral, with active choroidal neovascularization: Secondary | ICD-10-CM | POA: Diagnosis not present

## 2015-10-25 DIAGNOSIS — M50322 Other cervical disc degeneration at C5-C6 level: Secondary | ICD-10-CM | POA: Diagnosis not present

## 2015-10-25 DIAGNOSIS — M50122 Cervical disc disorder at C5-C6 level with radiculopathy: Secondary | ICD-10-CM | POA: Diagnosis not present

## 2015-10-25 DIAGNOSIS — M542 Cervicalgia: Secondary | ICD-10-CM | POA: Diagnosis not present

## 2015-10-27 DIAGNOSIS — R0789 Other chest pain: Secondary | ICD-10-CM | POA: Diagnosis not present

## 2015-11-17 DIAGNOSIS — H353231 Exudative age-related macular degeneration, bilateral, with active choroidal neovascularization: Secondary | ICD-10-CM | POA: Diagnosis not present

## 2015-11-24 DIAGNOSIS — H26492 Other secondary cataract, left eye: Secondary | ICD-10-CM | POA: Diagnosis not present

## 2015-12-15 DIAGNOSIS — H353231 Exudative age-related macular degeneration, bilateral, with active choroidal neovascularization: Secondary | ICD-10-CM | POA: Diagnosis not present

## 2016-01-11 DIAGNOSIS — M1711 Unilateral primary osteoarthritis, right knee: Secondary | ICD-10-CM | POA: Diagnosis not present

## 2016-01-14 DIAGNOSIS — R000 Tachycardia, unspecified: Secondary | ICD-10-CM | POA: Diagnosis not present

## 2016-01-15 ENCOUNTER — Encounter (HOSPITAL_COMMUNITY): Payer: Self-pay | Admitting: *Deleted

## 2016-01-15 ENCOUNTER — Emergency Department (HOSPITAL_COMMUNITY): Payer: Medicare Other

## 2016-01-15 ENCOUNTER — Emergency Department (HOSPITAL_COMMUNITY)
Admission: EM | Admit: 2016-01-15 | Discharge: 2016-01-15 | Disposition: A | Discharge status: Home / Self Care | Payer: Medicare Other | Attending: Emergency Medicine | Admitting: Emergency Medicine

## 2016-01-15 DIAGNOSIS — Z79899 Other long term (current) drug therapy: Secondary | ICD-10-CM | POA: Insufficient documentation

## 2016-01-15 DIAGNOSIS — Z955 Presence of coronary angioplasty implant and graft: Secondary | ICD-10-CM | POA: Insufficient documentation

## 2016-01-15 DIAGNOSIS — R002 Palpitations: Secondary | ICD-10-CM | POA: Diagnosis not present

## 2016-01-15 DIAGNOSIS — I252 Old myocardial infarction: Secondary | ICD-10-CM | POA: Insufficient documentation

## 2016-01-15 DIAGNOSIS — I493 Ventricular premature depolarization: Secondary | ICD-10-CM | POA: Diagnosis not present

## 2016-01-15 DIAGNOSIS — I10 Essential (primary) hypertension: Secondary | ICD-10-CM | POA: Diagnosis not present

## 2016-01-15 DIAGNOSIS — Z7982 Long term (current) use of aspirin: Secondary | ICD-10-CM | POA: Diagnosis not present

## 2016-01-15 DIAGNOSIS — I2510 Atherosclerotic heart disease of native coronary artery without angina pectoris: Secondary | ICD-10-CM | POA: Insufficient documentation

## 2016-01-15 DIAGNOSIS — J9811 Atelectasis: Secondary | ICD-10-CM | POA: Diagnosis not present

## 2016-01-15 LAB — CBC
HCT: 46.5 % — ABNORMAL HIGH (ref 36.0–46.0)
Hemoglobin: 15.1 g/dL — ABNORMAL HIGH (ref 12.0–15.0)
MCH: 31.6 pg (ref 26.0–34.0)
MCHC: 32.5 g/dL (ref 30.0–36.0)
MCV: 97.3 fL (ref 78.0–100.0)
Platelets: 213 10*3/uL (ref 150–400)
RBC: 4.78 MIL/uL (ref 3.87–5.11)
RDW: 14.2 % (ref 11.5–15.5)
WBC: 8.1 10*3/uL (ref 4.0–10.5)

## 2016-01-15 LAB — BASIC METABOLIC PANEL
Anion gap: 9 (ref 5–15)
BUN: 19 mg/dL (ref 6–20)
CO2: 27 mmol/L (ref 22–32)
Calcium: 9.4 mg/dL (ref 8.9–10.3)
Chloride: 104 mmol/L (ref 101–111)
Creatinine, Ser: 0.85 mg/dL (ref 0.44–1.00)
GFR calc Af Amer: >60 mL/min (ref >60)
GFR calc non Af Amer: 58 mL/min — ABNORMAL LOW (ref >60)
Glucose, Bld: 114 mg/dL — ABNORMAL HIGH (ref 65–99)
Potassium: 4 mmol/L (ref 3.5–5.1)
Sodium: 140 mmol/L (ref 135–145)

## 2016-01-15 LAB — I-STAT TROPONIN, ED
Troponin i, poc: 0.01 ng/mL (ref 0.00–0.08)
Troponin i, poc: 0.01 ng/mL (ref 0.00–0.08)

## 2016-01-15 MED ORDER — METOPROLOL TARTRATE 25 MG PO TABS
12.5000 mg | ORAL_TABLET | Freq: Two times a day (BID) | ORAL | 0 refills | Status: DC | PRN
Start: 2016-01-15 — End: 2016-02-27

## 2016-01-15 NOTE — ED Triage Notes (Signed)
Pt reports palpitations, pt had EMS come to her house, medics performed EKG showing A-fib with RVR. Pt refused transport. Pt reports dizziness since Friday. Pt denies CP, shortness of breath.

## 2016-01-15 NOTE — Discharge Instructions (Signed)
You have frequent premature heart beats. Take metoprolol 12.5 mg twice daily as needed if you feel the palpitations.  Call your heart doctor to get an appointment in a week.   Return to ER if you have shortness of breath, palpitations, chest pain, abdominal pain, worse dizziness.

## 2016-01-15 NOTE — ED Provider Notes (Signed)
[facility] DEPT Provider Note   CSN: [id number] Arrival date & time: 01/15/16  [id number]  First Provider Contact:  First MD Initiated Contact with Patient 01/15/16 0422        History   Chief Complaint Chief Complaint  Patient presents with  . Dizziness  . Palpitations    HPI Shelly Obrien is a 82 y.o. female hx of CAD, HL, HTN, MI here with palpitations, dizziness. Patient states that she has some dizziness 5 days ago that resolved. She states that she had history of inner ear problems and dizziness is not new. She states that earlier today, she had some palpitations. Denies any chest pain. EMS came to the house and found her in afib but she refused transport. Family brought her to the ED for evaluation. Denies shortness of breath or fevers.   The history is provided by the patient.    Past Medical History:  Diagnosis Date  . Complication of anesthesia    hard to wake   . Coronary artery disease   . H/O: hysterectomy   . Hx of lumpectomy   . Hyperlipidemia   . Hypertension   . Myocardial infarct (HCC)    hx of  . PONV (postoperative nausea and vomiting)   . Wears glasses     Patient Active Problem List   Diagnosis Date Noted  . CHOLELITHIASIS 02/14/2009  . HYPERLIPIDEMIA 12/29/2008  . HYPERTENSION 12/29/2008  . MYOCARDIAL INFARCTION, HX OF 12/29/2008  . CAD 12/29/2008    Past Surgical History:  Procedure Laterality Date  . ABDOMINAL HYSTERECTOMY    . ANGIOPLASTY    . BROW LIFT Right 04/07/2014   Procedure: REPAIR OF ENTROPION AND TRICHIASIS OF RIGHT LOWER EYE LID ;  Surgeon: Theodoro Kos, DO;  Location: [facility];  Service: Plastics;  Laterality: Right;  . CARPAL TUNNEL RELEASE     left  . DILATION AND CURETTAGE OF UTERUS    . EYE SURGERY     both cataracts  . KNEE SURGERY     right    OB History    No data available       Home Medications    Prior to Admission medications   Medication Sig Start Date End Date Taking? Authorizing  Provider  amLODipine (NORVASC) 5 MG tablet Take 5 mg by mouth every morning.    Yes Historical Provider, MD  aspirin 81 MG tablet Take 81 mg by mouth every morning.    Yes Historical Provider, MD  atenolol (TENORMIN) 25 MG tablet Take 25 mg by mouth daily.    Yes Historical Provider, MD  cyanocobalamin (V-R VITAMIN B-12) 500 MCG tablet Take 1,000 mcg by mouth every Monday, Wednesday, and Friday.    Yes Historical Provider, MD  Fluticasone-Salmeterol (ADVAIR DISKUS) 100-50 MCG/DOSE AEPB Inhale 1 puff into the lungs 2 (two) times daily as needed. (COLD SYMPTOMS) 06/02/13  Yes Historical Provider, MD  Multiple Vitamins-Minerals (ICAPS) CAPS Take 1 capsule by mouth every morning.    Yes Historical Provider, MD  nitroGLYCERIN (NITROSTAT) 0.4 MG SL tablet Place 0.4 mg under the tongue every 5 (five) minutes as needed for chest pain.    Yes Historical Provider, MD  polyvinyl alcohol (ARTIFICIAL TEARS) 1.4 % ophthalmic solution Place 1 drop into the right eye 2 (two) times daily.   Yes Historical Provider, MD  triamcinolone (NASACORT ALLERGY 24HR) 55 MCG/ACT AERO nasal inhaler Place 2 sprays into the nose daily as needed (allergies).   Yes Historical Provider, MD  ciprofloxacin (CIPRO) 500 MG tablet Take 1 tablet (500 mg total) by mouth 2 (two) times daily. Patient not taking: Reported on 09/25/2015 07/31/15   Veryl Speak, MD  HYDROcodone-acetaminophen (NORCO) 5-325 MG tablet Take 1 tablet by mouth every 6 (six) hours as needed. Patient not taking: Reported on 09/25/2015 07/31/15   Veryl Speak, MD  levofloxacin (LEVAQUIN) 500 MG tablet Take 1 tablet (500 mg total) by mouth daily. Patient not taking: Reported on 01/15/2016 09/25/15   Isla Pence, MD  predniSONE (STERAPRED UNI-PAK 21 TAB) 10 MG (21) TBPK tablet Take 1 tablet (10 mg total) by mouth daily. Take 6 tabs by mouth daily  for 2 days, then 5 tabs for 2 days, then 4 tabs for 2 days, then 3 tabs for 2 days, 2 tabs for 2 days, then 1 tab by mouth daily  for 2 days Patient not taking: Reported on 01/15/2016 09/25/15   Isla Pence, MD    Family History Family History  Problem Relation Age of Onset  . Coronary artery disease      positive family hx of  . Cancer      family hx of    Social History Social History  Substance Use Topics  . Smoking status: Never Smoker  . Smokeless tobacco: Never Used  . Alcohol use No     Allergies   Antihistamines, chlorpheniramine-type; Penicillins; Contrast media [iodinated diagnostic agents]; Sulfonamide derivatives; and Pheniramine   Review of Systems Review of Systems  Cardiovascular: Positive for palpitations.  All other systems reviewed and are negative.    Physical Exam Updated Vital Signs BP (!) 169/107   Pulse 67   Temp 99.1 F (37.3 C) (Oral)   Resp 11   Ht 5\' 5"  (1.651 m)   Wt 160 lb (72.6 kg)   SpO2 97%   BMI 26.63 kg/m   Physical Exam  Constitutional: She is oriented to person, place, and time. She appears well-developed and well-nourished.  HENT:  Head: Normocephalic.  Eyes: EOM are normal. Pupils are equal, round, and reactive to light.  Neck: Normal range of motion. Neck supple.  Cardiovascular: Normal rate, regular rhythm and normal heart sounds.   Pulmonary/Chest: Effort normal and breath sounds normal. No respiratory distress. She has no wheezes. She has no rales.  Abdominal: Soft. Bowel sounds are normal. She exhibits no distension. There is no tenderness. There is no guarding.  Musculoskeletal: Normal range of motion. She exhibits no edema.  Neurological: She is alert and oriented to person, place, and time.  Skin: Skin is warm.  Psychiatric: She has a normal mood and affect.  Nursing note and vitals reviewed.    ED Treatments / Results  Labs (all labs ordered are listed, but only abnormal results are displayed) Labs Reviewed  BASIC METABOLIC PANEL - Abnormal; Notable for the following:       Result Value   Glucose, Bld 114 (*)    GFR calc non Af  Amer 58 (*)    All other components within normal limits  CBC - Abnormal; Notable for the following:    Hemoglobin 15.1 (*)    HCT 46.5 (*)    All other components within normal limits  I-STAT TROPOININ, ED  I-STAT TROPOININ, ED    EKG  EKG Interpretation  Date/Time:  Sunday January 15 2016 04:59:09 EDT Ventricular Rate:  66 PR Interval:  172 QRS Duration: 116 QT Interval:  435 QTC Calculation: 456 R Axis:   -72 Text Interpretation:  Sinus rhythm Ventricular  trigeminy Left anterior fascicular block Probable left ventricular hypertrophy Anterior Q waves, possibly due to LVH No significant change since last tracing Confirmed by Morgaine Kimball  MD, Carriann Hesse ([id number]) on 01/15/2016 5:07:31 AM       Radiology Dg Chest 2 View  Result Date: 01/15/2016 CLINICAL DATA:  Acute onset of palpitations and dizziness. Initial encounter. EXAM: CHEST  2 VIEW COMPARISON:  Chest radiograph and CT of the chest performed 09/25/2015 FINDINGS: The lungs are well-aerated. Mild vascular congestion is noted. Mild bibasilar atelectasis is seen. There is no evidence of pleural effusion or pneumothorax. The heart is borderline enlarged. No acute osseous abnormalities are seen. IMPRESSION: Mild vascular congestion and borderline cardiomegaly. Mild bibasilar atelectasis seen. Electronically Signed   By: Garald Balding M.D.   On: 01/15/2016 02:51    Procedures Procedures (including critical care time)  Medications Ordered in ED Medications - No data to display   Initial Impression / Assessment and Plan / ED Course  I have reviewed the triage vital signs and the nursing notes.  Pertinent labs & imaging results that were available during my care of the patient were reviewed by me and considered in my medical decision making (see chart for details).  Clinical Course   RENEZMEE CLIMER is a 82 y.o. female here with palpitations. I reviewed tracing from EMS and it has poor baseline. In the ED, she remained in sinus rhythm rate  around 70s- 80s with some PVCs. She had a some dizziness that resolved and has hx of dizziness from inner ear problems. She has nl neuro exam and nl gait and I doubt posterior stroke. Will get labs, trop x 2, CXR.   5:13 AM Remained in sinus rhythm with frequent PVCs. No signs of Afib on the monitor. Trop neg x 2. Will give metoprolol prn palpitations. Recommend calling [facility] to get an earlier appointment.      Final Clinical Impressions(s) / ED Diagnoses   Final diagnoses:  None    New Prescriptions New Prescriptions   No medications on file     Drenda Freeze, MD 01/15/16 [phone]

## 2016-01-19 DIAGNOSIS — H353221 Exudative age-related macular degeneration, left eye, with active choroidal neovascularization: Secondary | ICD-10-CM | POA: Diagnosis not present

## 2016-01-24 ENCOUNTER — Emergency Department (HOSPITAL_COMMUNITY)
Admission: EM | Admit: 2016-01-24 | Discharge: 2016-01-24 | Disposition: A | Discharge status: Home / Self Care | Payer: Medicare Other | Attending: Emergency Medicine | Admitting: Emergency Medicine

## 2016-01-24 ENCOUNTER — Encounter (HOSPITAL_COMMUNITY): Payer: Self-pay

## 2016-01-24 ENCOUNTER — Emergency Department (HOSPITAL_COMMUNITY): Payer: Medicare Other

## 2016-01-24 DIAGNOSIS — Z79899 Other long term (current) drug therapy: Secondary | ICD-10-CM | POA: Diagnosis not present

## 2016-01-24 DIAGNOSIS — I10 Essential (primary) hypertension: Secondary | ICD-10-CM | POA: Insufficient documentation

## 2016-01-24 DIAGNOSIS — N23 Unspecified renal colic: Secondary | ICD-10-CM | POA: Diagnosis not present

## 2016-01-24 DIAGNOSIS — N200 Calculus of kidney: Secondary | ICD-10-CM | POA: Insufficient documentation

## 2016-01-24 DIAGNOSIS — R109 Unspecified abdominal pain: Secondary | ICD-10-CM

## 2016-01-24 DIAGNOSIS — I252 Old myocardial infarction: Secondary | ICD-10-CM | POA: Insufficient documentation

## 2016-01-24 DIAGNOSIS — I2510 Atherosclerotic heart disease of native coronary artery without angina pectoris: Secondary | ICD-10-CM | POA: Insufficient documentation

## 2016-01-24 DIAGNOSIS — Z79891 Long term (current) use of opiate analgesic: Secondary | ICD-10-CM | POA: Diagnosis not present

## 2016-01-24 DIAGNOSIS — N132 Hydronephrosis with renal and ureteral calculous obstruction: Secondary | ICD-10-CM | POA: Diagnosis not present

## 2016-01-24 LAB — COMPREHENSIVE METABOLIC PANEL
ALT: 16 U/L (ref 14–54)
AST: 20 U/L (ref 15–41)
Albumin: 4.1 g/dL (ref 3.5–5.0)
Alkaline Phosphatase: 110 U/L (ref 38–126)
Anion gap: 8 (ref 5–15)
BUN: 20 mg/dL (ref 6–20)
CO2: 26 mmol/L (ref 22–32)
Calcium: 10 mg/dL (ref 8.9–10.3)
Chloride: 105 mmol/L (ref 101–111)
Creatinine, Ser: 1.21 mg/dL — ABNORMAL HIGH (ref 0.44–1.00)
GFR calc Af Amer: 44 mL/min — ABNORMAL LOW (ref >60)
GFR calc non Af Amer: 38 mL/min — ABNORMAL LOW (ref >60)
Glucose, Bld: 146 mg/dL — ABNORMAL HIGH (ref 65–99)
Potassium: 4.4 mmol/L (ref 3.5–5.1)
Sodium: 139 mmol/L (ref 135–145)
Total Bilirubin: 1.2 mg/dL (ref 0.3–1.2)
Total Protein: 7.3 g/dL (ref 6.5–8.1)

## 2016-01-24 LAB — URINALYSIS, ROUTINE W REFLEX MICROSCOPIC
Glucose, UA: NEGATIVE mg/dL
Ketones, ur: NEGATIVE mg/dL
Nitrite: NEGATIVE
Protein, ur: 30 mg/dL — AB
Specific Gravity, Urine: 1.021 (ref 1.005–1.030)
pH: 5 (ref 5.0–8.0)

## 2016-01-24 LAB — URINE MICROSCOPIC-ADD ON

## 2016-01-24 LAB — CBC
HCT: 45.5 % (ref 36.0–46.0)
Hemoglobin: 15.1 g/dL — ABNORMAL HIGH (ref 12.0–15.0)
MCH: 31.5 pg (ref 26.0–34.0)
MCHC: 33.2 g/dL (ref 30.0–36.0)
MCV: 95 fL (ref 78.0–100.0)
Platelets: 206 10*3/uL (ref 150–400)
RBC: 4.79 MIL/uL (ref 3.87–5.11)
RDW: 14 % (ref 11.5–15.5)
WBC: 10.6 10*3/uL — ABNORMAL HIGH (ref 4.0–10.5)

## 2016-01-24 LAB — LIPASE, BLOOD: Lipase: 24 U/L (ref 11–51)

## 2016-01-24 MED ORDER — HYDROCODONE-ACETAMINOPHEN 5-325 MG PO TABS: 1.0000 | ORAL_TABLET | Freq: Four times a day (QID) | ORAL | 0 refills | Status: DC | PRN

## 2016-01-24 MED ORDER — ONDANSETRON 4 MG PO TBDP
4.0000 mg | ORAL_TABLET | Freq: Once | ORAL | Status: AC | PRN
  Administered 2016-01-24: 4 mg via ORAL
  Filled 2016-01-24: qty 1

## 2016-01-24 MED ORDER — KETOROLAC TROMETHAMINE 30 MG/ML IJ SOLN
15.0000 mg | Freq: Once | INTRAMUSCULAR | Status: AC
  Administered 2016-01-24: 15 mg via INTRAVENOUS
  Filled 2016-01-24: qty 1

## 2016-01-24 MED ORDER — ONDANSETRON 8 MG PO TBDP: 8.0000 mg | ORAL_TABLET | Freq: Three times a day (TID) | ORAL | 0 refills | Status: DC | PRN

## 2016-01-24 MED ORDER — HYDROMORPHONE HCL 1 MG/ML IJ SOLN
0.5000 mg | Freq: Once | INTRAMUSCULAR | Status: AC
  Administered 2016-01-24: 0.5 mg via INTRAVENOUS
  Filled 2016-01-24: qty 1

## 2016-01-24 MED ORDER — TAMSULOSIN HCL 0.4 MG PO CAPS: 0.4000 mg | ORAL_CAPSULE | Freq: Every day | ORAL | 0 refills | Status: DC

## 2016-01-24 MED ORDER — MORPHINE SULFATE (PF) 4 MG/ML IV SOLN
4.0000 mg | Freq: Once | INTRAVENOUS | Status: AC
  Administered 2016-01-24: 4 mg via INTRAVENOUS
  Filled 2016-01-24: qty 1

## 2016-01-24 MED ORDER — ONDANSETRON HCL 4 MG/2ML IJ SOLN
4.0000 mg | Freq: Once | INTRAMUSCULAR | Status: AC
  Administered 2016-01-24: 4 mg via INTRAVENOUS
  Filled 2016-01-24: qty 2

## 2016-01-24 NOTE — ED Triage Notes (Signed)
Pt c/o L flank pain radiating into LUQ and emesis starting last night.  Pain score 8/10.  Pt has not taken anything for pain.  Pt reports urinary frequency at night.  Hx of kindey stones.

## 2016-01-24 NOTE — ED Provider Notes (Signed)
[facility] DEPT Provider Note   CSN: [id number] Arrival date & time: 01/24/16  1211     History   Chief Complaint Chief Complaint  Patient presents with  . Flank Pain  . Abdominal Pain  . Emesis    HPI Shelly Obrien is a 82 y.o. female.  Patient c/o left flank pain, acute onset last night. Pain constant, dull, moderate-severe, radiates towards left groin.  No hematuria or dysuria. Unsure if same as prior kidney stone pain. Is having normal bms. No diarrhea. Nausea, no vomiting. Denies trauma/strain to area. No rash/skin lesions to area of pain.    The history is provided by the patient.  Flank Pain  Associated symptoms include abdominal pain. Pertinent negatives include no chest pain, no headaches and no shortness of breath.  Abdominal Pain   Associated symptoms include nausea. Pertinent negatives include fever, dysuria and headaches.  Emesis   Associated symptoms include abdominal pain and chills. Pertinent negatives include no fever and no headaches.    Past Medical History:  Diagnosis Date  . Complication of anesthesia    hard to wake   . Coronary artery disease   . H/O: hysterectomy   . Hx of lumpectomy   . Hyperlipidemia   . Hypertension   . Kidney stones   . Myocardial infarct (HCC)    hx of  . PONV (postoperative nausea and vomiting)   . Wears glasses     Patient Active Problem List   Diagnosis Date Noted  . CHOLELITHIASIS 02/14/2009  . HYPERLIPIDEMIA 12/29/2008  . HYPERTENSION 12/29/2008  . MYOCARDIAL INFARCTION, HX OF 12/29/2008  . CAD 12/29/2008    Past Surgical History:  Procedure Laterality Date  . ABDOMINAL HYSTERECTOMY    . ANGIOPLASTY    . BROW LIFT Right 04/07/2014   Procedure: REPAIR OF ENTROPION AND TRICHIASIS OF RIGHT LOWER EYE LID ;  Surgeon: Theodoro Kos, DO;  Location: [facility];  Service: Plastics;  Laterality: Right;  . CARPAL TUNNEL RELEASE     left  . DILATION AND CURETTAGE OF UTERUS    . EYE SURGERY     both cataracts  . KNEE SURGERY     right    OB History    No data available       Home Medications    Prior to Admission medications   Medication Sig Start Date End Date Taking? Authorizing Provider  amLODipine (NORVASC) 5 MG tablet Take 5 mg by mouth every morning.    Yes Historical Provider, MD  aspirin 81 MG tablet Take 81 mg by mouth every morning.    Yes Historical Provider, MD  cyanocobalamin (V-R VITAMIN B-12) 500 MCG tablet Take 1,000 mcg by mouth every Monday, Wednesday, and Friday.    Yes Historical Provider, MD  Fluticasone-Salmeterol (ADVAIR DISKUS) 100-50 MCG/DOSE AEPB Inhale 1 puff into the lungs 2 (two) times daily as needed. (COLD SYMPTOMS) 06/02/13  Yes Historical Provider, MD  metoprolol (LOPRESSOR) 25 MG tablet Take 0.5 tablets (12.5 mg total) by mouth 2 (two) times daily as needed. Patient taking differently: Take 12.5 mg by mouth daily.  01/15/16  Yes Drenda Freeze, MD  Multiple Vitamins-Minerals (ICAPS) CAPS Take 1 capsule by mouth every morning.    Yes Historical Provider, MD  nitroGLYCERIN (NITROSTAT) 0.4 MG SL tablet Place 0.4 mg under the tongue every 5 (five) minutes as needed for chest pain.    Yes Historical Provider, MD  polyvinyl alcohol (ARTIFICIAL TEARS) 1.4 % ophthalmic solution Place 1  drop into the right eye 2 (two) times daily.   Yes Historical Provider, MD  triamcinolone (NASACORT ALLERGY 24HR) 55 MCG/ACT AERO nasal inhaler Place 2 sprays into the nose daily as needed (allergies).   Yes Historical Provider, MD  HYDROcodone-acetaminophen (NORCO) 5-325 MG tablet Take 1 tablet by mouth every 6 (six) hours as needed. Patient not taking: Reported on 09/25/2015 07/31/15   Veryl Speak, MD  levofloxacin (LEVAQUIN) 500 MG tablet Take 1 tablet (500 mg total) by mouth daily. Patient not taking: Reported on 01/15/2016 09/25/15   Isla Pence, MD  predniSONE (STERAPRED UNI-PAK 21 TAB) 10 MG (21) TBPK tablet Take 1 tablet (10 mg total) by mouth daily. Take 6  tabs by mouth daily  for 2 days, then 5 tabs for 2 days, then 4 tabs for 2 days, then 3 tabs for 2 days, 2 tabs for 2 days, then 1 tab by mouth daily for 2 days Patient not taking: Reported on 01/15/2016 09/25/15   Isla Pence, MD    Family History Family History  Problem Relation Age of Onset  . Coronary artery disease      positive family hx of  . Cancer      family hx of    Social History Social History  Substance Use Topics  . Smoking status: Never Smoker  . Smokeless tobacco: Never Used  . Alcohol use No     Allergies   Antihistamines, chlorpheniramine-type; Penicillins; Contrast media [iodinated diagnostic agents]; Sulfonamide derivatives; and Pheniramine   Review of Systems Review of Systems  Constitutional: Positive for chills. Negative for fever.  HENT: Negative for sore throat.   Eyes: Negative for redness.  Respiratory: Negative for shortness of breath.   Cardiovascular: Negative for chest pain.  Gastrointestinal: Positive for abdominal pain and nausea.  Genitourinary: Positive for flank pain. Negative for dysuria.  Musculoskeletal: Negative for back pain and neck pain.  Skin: Negative for rash.  Neurological: Negative for headaches.  Hematological: Does not bruise/bleed easily.  Psychiatric/Behavioral: Negative for confusion.     Physical Exam Updated Vital Signs BP 156/91 (BP Location: Left Arm)   Pulse 80   Temp 97.7 F (36.5 C) (Oral)   Resp 16   Ht 5\' 5"  (1.651 m)   Wt 72.6 kg   SpO2 97%   BMI 26.63 kg/m   Physical Exam  Constitutional: She appears well-developed and well-nourished. No distress.  HENT:  Mouth/Throat: Oropharynx is clear and moist.  Eyes: Conjunctivae are normal. No scleral icterus.  Neck: Neck supple. No tracheal deviation present.  Cardiovascular: Normal rate, regular rhythm, normal heart sounds and intact distal pulses.   Pulmonary/Chest: Effort normal and breath sounds normal. No respiratory distress.  Abdominal:  Soft. Normal appearance and bowel sounds are normal. She exhibits no distension and no mass. There is no tenderness. There is no rebound and no guarding. No hernia.  Genitourinary:  Genitourinary Comments: No cva tenderness  Musculoskeletal: She exhibits no edema.  TLS spine non tender.   Neurological: She is alert.  Skin: Skin is warm and dry. No rash noted. She is not diaphoretic.  No shingles/rash in area of pain  Psychiatric: She has a normal mood and affect.  Nursing note and vitals reviewed.    ED Treatments / Results  Labs (all labs ordered are listed, but only abnormal results are displayed) Results for orders placed or performed during the hospital encounter of 01/24/16  Lipase, blood  Result Value Ref Range   Lipase 24 11 - 51  U/L  Comprehensive metabolic panel  Result Value Ref Range   Sodium 139 135 - 145 mmol/L   Potassium 4.4 3.5 - 5.1 mmol/L   Chloride 105 101 - 111 mmol/L   CO2 26 22 - 32 mmol/L   Glucose, Bld 146 (H) 65 - 99 mg/dL   BUN 20 6 - 20 mg/dL   Creatinine, Ser 1.21 (H) 0.44 - 1.00 mg/dL   Calcium 10.0 8.9 - 10.3 mg/dL   Total Protein 7.3 6.5 - 8.1 g/dL   Albumin 4.1 3.5 - 5.0 g/dL   AST 20 15 - 41 U/L   ALT 16 14 - 54 U/L   Alkaline Phosphatase 110 38 - 126 U/L   Total Bilirubin 1.2 0.3 - 1.2 mg/dL   GFR calc non Af Amer 38 (L) >60 mL/min   GFR calc Af Amer 44 (L) >60 mL/min   Anion gap 8 5 - 15  CBC  Result Value Ref Range   WBC 10.6 (H) 4.0 - 10.5 K/uL   RBC 4.79 3.87 - 5.11 MIL/uL   Hemoglobin 15.1 (H) 12.0 - 15.0 g/dL   HCT 45.5 36.0 - 46.0 %   MCV 95.0 78.0 - 100.0 fL   MCH 31.5 26.0 - 34.0 pg   MCHC 33.2 30.0 - 36.0 g/dL   RDW 14.0 11.5 - 15.5 %   Platelets 206 150 - 400 K/uL  Urinalysis, Routine w reflex microscopic  Result Value Ref Range   Color, Urine AMBER (A) YELLOW   APPearance TURBID (A) CLEAR   Specific Gravity, Urine 1.021 1.005 - 1.030   pH 5.0 5.0 - 8.0   Glucose, UA NEGATIVE NEGATIVE mg/dL   Hgb urine dipstick  LARGE (A) NEGATIVE   Bilirubin Urine SMALL (A) NEGATIVE   Ketones, ur NEGATIVE NEGATIVE mg/dL   Protein, ur 30 (A) NEGATIVE mg/dL   Nitrite NEGATIVE NEGATIVE   Leukocytes, UA MODERATE (A) NEGATIVE  Urine microscopic-add on  Result Value Ref Range   Squamous Epithelial / LPF 6-30 (A) NONE SEEN   WBC, UA 6-30 0 - 5 WBC/hpf   RBC / HPF TOO NUMEROUS TO COUNT 0 - 5 RBC/hpf   Bacteria, UA FEW (A) NONE SEEN    Ct Renal Stone Study  Result Date: 01/24/2016 CLINICAL DATA:  Left flank pain radiating to the left upper quadrant. Emesis beginning last night. EXAM: CT ABDOMEN AND PELVIS WITHOUT CONTRAST TECHNIQUE: Multidetector CT imaging of the abdomen and pelvis was performed following the standard protocol without IV contrast. COMPARISON:  07/31/2015 and previous FINDINGS: Lower chest: Chronic scarring at the right lung base. No pleural or pericardial fluid. Hepatobiliary: Liver appears normal without contrast. Multiple stones dependent within the gallbladder. No evidence of cholecystitis or passing stone by CT. Pancreas: Normal. Spleen: Normal. Adrenals/Urinary Tract: Adrenal glands are normal. Right kidney shows 3 small nonobstructing stones. The largest in the upper pole measures 4 mm. Left kidney does not contain any stones. There is full loss of the renal collecting system and proximal ureter. There are 2 stones within the ureter at the level of the L5 vertebral body, the larger measuring 4 x 6 mm in the smaller measuring 3 mm in diameter. No stone distal to that. No stone in the bladder. Stomach/Bowel: No bowel pathology. Vascular/Lymphatic: Aortic atherosclerosis. No aneurysm. The IVC is normal. No retroperitoneal mass or lymphadenopathy. Reproductive: Previous hysterectomy.  No pelvic mass. Other: None. Musculoskeletal: Curvature and advanced chronic degenerative changes of the spine. No fracture. IMPRESSION: Left hydroureteronephrosis, moderate in  severity. Two stones within the left ureter at the  level of the L5 vertebral body, the larger measuring 4.6 mm in diameter in the smaller measuring 3 mm in diameter. No other left renal stones. Three small nonobstructing stones in the right kidney. Cholelithiasis without CT evidence of cholecystitis. Electronically Signed   By: Nelson Chimes M.D.   On: 01/24/2016 14:58    EKG  EKG Interpretation None       Radiology No results found.  Procedures Procedures (including critical care time)  Medications Ordered in ED Medications  morphine 4 MG/ML injection 4 mg (not administered)  ondansetron (ZOFRAN) injection 4 mg (not administered)  ondansetron (ZOFRAN-ODT) disintegrating tablet 4 mg (4 mg Oral Given 01/24/16 1252)     Initial Impression / Assessment and Plan / ED Course  I have reviewed the triage vital signs and the nursing notes.  Pertinent labs & imaging results that were available during my care of the patient were reviewed by me and considered in my medical decision making (see chart for details).  Clinical Course   Iv ns. Morphine iv. zofran iv.  CT.  Reviewed nursing notes and prior charts for additional history.   Patient denies urinary urgency or dysuria.  No fever or chills.  CT c/w ureteral colic/2 stones.   Pain persists. toradol 15 mg iv.  Pain improved, but persists. Dilaudid iv.   Recheck again, pain is resolved. No nv. abd soft nt.  Patient currently appears stable for d/c.     Final Clinical Impressions(s) / ED Diagnoses   Final diagnoses:  None    New Prescriptions New Prescriptions   No medications on file     Lajean Saver, MD 01/24/16 [phone]

## 2016-01-24 NOTE — Discharge Instructions (Signed)
It was our pleasure to provide your ER care today - we hope that you feel better.  Rest. Drink plenty of fluids. Strain urine.  Take motrin or aleve as need for pain. You may also take hydrocodone as need for pain. No driving for the next 6 hours or when taking hydrocodone. Also, do not take tylenol or acetaminophen containing medication when taking hydrocodone.  Take flomax as prescribed. Take zofran as need for nausea.   Follow up with urologist in the next 2-3 days if symptoms fail to improve/resolve - see referral, call office to arrange appointment.   Return to ER if worse, new symptoms, fevers, persistent vomiting, intractable pain, other concern.

## 2016-01-30 ENCOUNTER — Encounter (HOSPITAL_COMMUNITY): Payer: Self-pay | Admitting: *Deleted

## 2016-01-30 ENCOUNTER — Ambulatory Visit (HOSPITAL_COMMUNITY)
Admission: EM | Admit: 2016-01-30 | Discharge: 2016-01-30 | Disposition: A | Discharge status: Home / Self Care | Payer: Medicare Other | Attending: Family Medicine | Admitting: Family Medicine

## 2016-01-30 DIAGNOSIS — N200 Calculus of kidney: Secondary | ICD-10-CM | POA: Diagnosis not present

## 2016-01-30 LAB — POCT URINALYSIS DIP (DEVICE)
Glucose, UA: NEGATIVE mg/dL
Nitrite: NEGATIVE
Protein, ur: 30 mg/dL — AB
Specific Gravity, Urine: 1.02 (ref 1.005–1.030)
Urobilinogen, UA: 2 mg/dL — ABNORMAL HIGH (ref 0.0–1.0)
pH: 5.5 (ref 5.0–8.0)

## 2016-01-30 MED ORDER — HYDROCODONE-ACETAMINOPHEN 5-325 MG PO TABS: 1.0000 | ORAL_TABLET | Freq: Four times a day (QID) | ORAL | 0 refills | Status: DC | PRN

## 2016-01-30 MED ORDER — KETOROLAC TROMETHAMINE 60 MG/2ML IM SOLN: 60.0000 mg | Freq: Once | INTRAMUSCULAR | Status: DC

## 2016-01-30 NOTE — ED Provider Notes (Signed)
CSN: [id number]     Arrival date & time 01/30/16  1153 History   None    No chief complaint on file.  (Consider location/radiation/quality/duration/timing/severity/associated sxs/prior Treatment) Patient was dx'd with kidney stone and tx'd with zofran and norco.  She is awaiting appointment with her PCP. She has not passed the stone. She has colicky pain and it is severe.  She is taking zofran for nausea.  She is not in severe pain now.  She is running out of pain meds.  She does not have urology referral.   The history is provided by the patient.  Flank Pain  This is a new problem. The current episode started more than 1 week ago. The problem occurs constantly. The problem has not changed since onset.Nothing aggravates the symptoms. The symptoms are relieved by medications.    Past Medical History:  Diagnosis Date  . Complication of anesthesia    hard to wake   . Coronary artery disease   . H/O: hysterectomy   . Hx of lumpectomy   . Hyperlipidemia   . Hypertension   . Kidney stones   . Myocardial infarct (HCC)    hx of  . PONV (postoperative nausea and vomiting)   . Wears glasses    Past Surgical History:  Procedure Laterality Date  . ABDOMINAL HYSTERECTOMY    . ANGIOPLASTY    . BROW LIFT Right 04/07/2014   Procedure: REPAIR OF ENTROPION AND TRICHIASIS OF RIGHT LOWER EYE LID ;  Surgeon: Theodoro Kos, DO;  Location: [facility];  Service: Plastics;  Laterality: Right;  . CARPAL TUNNEL RELEASE     left  . DILATION AND CURETTAGE OF UTERUS    . EYE SURGERY     both cataracts  . KNEE SURGERY     right   Family History  Problem Relation Age of Onset  . Coronary artery disease      positive family hx of  . Cancer      family hx of   Social History  Substance Use Topics  . Smoking status: Never Smoker  . Smokeless tobacco: Never Used  . Alcohol use No   OB History    No data available     Review of Systems  Constitutional: Negative.   HENT:  Negative.   Eyes: Negative.   Respiratory: Negative.   Cardiovascular: Negative.   Gastrointestinal: Positive for nausea.  Endocrine: Negative.   Genitourinary: Positive for flank pain.  Skin: Negative.   Neurological: Negative.   Hematological: Negative.   Psychiatric/Behavioral: Negative.     Allergies  Antihistamines, chlorpheniramine-type; Penicillins; Contrast media [iodinated diagnostic agents]; Sulfonamide derivatives; and Pheniramine  Home Medications   Prior to Admission medications   Medication Sig Start Date End Date Taking? Authorizing Provider  amLODipine (NORVASC) 5 MG tablet Take 5 mg by mouth every morning.     Historical Provider, MD  aspirin 81 MG tablet Take 81 mg by mouth every morning.     Historical Provider, MD  cyanocobalamin (V-R VITAMIN B-12) 500 MCG tablet Take 1,000 mcg by mouth every Monday, Wednesday, and Friday.     Historical Provider, MD  Fluticasone-Salmeterol (ADVAIR DISKUS) 100-50 MCG/DOSE AEPB Inhale 1 puff into the lungs 2 (two) times daily as needed. (COLD SYMPTOMS) 06/02/13   Historical Provider, MD  HYDROcodone-acetaminophen (NORCO/VICODIN) 5-325 MG tablet Take 1-2 tablets by mouth every 6 (six) hours as needed for moderate pain. 01/24/16   Lajean Saver, MD  metoprolol (LOPRESSOR) 25 MG tablet Take  0.5 tablets (12.5 mg total) by mouth 2 (two) times daily as needed. Patient taking differently: Take 12.5 mg by mouth daily.  01/15/16   Drenda Freeze, MD  Multiple Vitamins-Minerals (ICAPS) CAPS Take 1 capsule by mouth every morning.     Historical Provider, MD  nitroGLYCERIN (NITROSTAT) 0.4 MG SL tablet Place 0.4 mg under the tongue every 5 (five) minutes as needed for chest pain.     Historical Provider, MD  ondansetron (ZOFRAN ODT) 8 MG disintegrating tablet Take 1 tablet (8 mg total) by mouth every 8 (eight) hours as needed for nausea or vomiting. 01/24/16   Lajean Saver, MD  polyvinyl alcohol (ARTIFICIAL TEARS) 1.4 % ophthalmic solution Place 1  drop into the right eye 2 (two) times daily.    Historical Provider, MD  tamsulosin (FLOMAX) 0.4 MG CAPS capsule Take 1 capsule (0.4 mg total) by mouth daily. 01/24/16   Lajean Saver, MD  triamcinolone (NASACORT ALLERGY 24HR) 55 MCG/ACT AERO nasal inhaler Place 2 sprays into the nose daily as needed (allergies).    Historical Provider, MD   Meds Ordered and Administered this Visit  Medications - No data to display  There were no vitals taken for this visit. No data found.   Physical Exam  Constitutional: She appears well-developed and well-nourished.  HENT:  Head: Normocephalic and atraumatic.  Eyes: EOM are normal. Pupils are equal, round, and reactive to light.  Neck: Normal range of motion. Neck supple.  Cardiovascular: Normal rate and regular rhythm.   Pulmonary/Chest: Effort normal and breath sounds normal.  Genitourinary:  Genitourinary Comments: Left CVA tenderness  Nursing note and vitals reviewed.   Urgent Care Course   Clinical Course    Procedures (including critical care time)  Labs Review Labs Reviewed  POCT URINALYSIS DIP (DEVICE) - Abnormal; Notable for the following:       Result Value   Bilirubin Urine SMALL (*)    Ketones, ur TRACE (*)    Hgb urine dipstick LARGE (*)    Protein, ur 30 (*)    Urobilinogen, UA 2.0 (*)    Leukocytes, UA SMALL (*)    All other components within normal limits    Imaging Review No results found.   Visual Acuity Review  Right Eye Distance:   Left Eye Distance:   Bilateral Distance:    Right Eye Near:   Left Eye Near:    Bilateral Near:         MDM  Kidney Stone - Call Urology and schedule appointment for this week. Follow up with PCP Norco 5/325 one to two po q 6hours prn #20 PUsh po fluids  If unable to void or if pain is unable to be controlled please go to ED.    Lysbeth Penner, FNP 01/30/16 [place], FNP 01/30/16 [phone]

## 2016-01-30 NOTE — ED Triage Notes (Signed)
Pt  Developed  l flank  Pain  Since last  Week    She  Was   Seen  In  Er    last  Week   She  Has  Pain     But  Not  As  Bad   As last  Week

## 2016-01-31 DIAGNOSIS — N209 Urinary calculus, unspecified: Secondary | ICD-10-CM | POA: Diagnosis not present

## 2016-01-31 DIAGNOSIS — N201 Calculus of ureter: Secondary | ICD-10-CM | POA: Diagnosis not present

## 2016-02-16 DIAGNOSIS — R21 Rash and other nonspecific skin eruption: Secondary | ICD-10-CM | POA: Diagnosis not present

## 2016-02-16 DIAGNOSIS — N949 Unspecified condition associated with female genital organs and menstrual cycle: Secondary | ICD-10-CM | POA: Diagnosis not present

## 2016-02-16 DIAGNOSIS — R8290 Unspecified abnormal findings in urine: Secondary | ICD-10-CM | POA: Diagnosis not present

## 2016-02-23 DIAGNOSIS — H353211 Exudative age-related macular degeneration, right eye, with active choroidal neovascularization: Secondary | ICD-10-CM | POA: Diagnosis not present

## 2016-02-24 ENCOUNTER — Encounter (HOSPITAL_COMMUNITY): Payer: Self-pay

## 2016-02-24 ENCOUNTER — Inpatient Hospital Stay (HOSPITAL_COMMUNITY)
Admission: EM | Admit: 2016-02-24 | Discharge: 2016-02-27 | DRG: 309 | Disposition: A | Discharge status: Home / Self Care | Payer: Medicare Other | Attending: Internal Medicine | Admitting: Internal Medicine

## 2016-02-24 DIAGNOSIS — I2510 Atherosclerotic heart disease of native coronary artery without angina pectoris: Secondary | ICD-10-CM | POA: Diagnosis not present

## 2016-02-24 DIAGNOSIS — J449 Chronic obstructive pulmonary disease, unspecified: Secondary | ICD-10-CM | POA: Diagnosis present

## 2016-02-24 DIAGNOSIS — Z882 Allergy status to sulfonamides status: Secondary | ICD-10-CM

## 2016-02-24 DIAGNOSIS — Z888 Allergy status to other drugs, medicaments and biological substances status: Secondary | ICD-10-CM

## 2016-02-24 DIAGNOSIS — E785 Hyperlipidemia, unspecified: Secondary | ICD-10-CM | POA: Diagnosis not present

## 2016-02-24 DIAGNOSIS — Z880 Allergy status to penicillin: Secondary | ICD-10-CM

## 2016-02-24 DIAGNOSIS — I480 Paroxysmal atrial fibrillation: Principal | ICD-10-CM | POA: Diagnosis present

## 2016-02-24 DIAGNOSIS — I10 Essential (primary) hypertension: Secondary | ICD-10-CM | POA: Diagnosis present

## 2016-02-24 DIAGNOSIS — Z955 Presence of coronary angioplasty implant and graft: Secondary | ICD-10-CM

## 2016-02-24 DIAGNOSIS — R002 Palpitations: Secondary | ICD-10-CM | POA: Diagnosis not present

## 2016-02-24 DIAGNOSIS — Z8249 Family history of ischemic heart disease and other diseases of the circulatory system: Secondary | ICD-10-CM

## 2016-02-24 DIAGNOSIS — N184 Chronic kidney disease, stage 4 (severe): Secondary | ICD-10-CM | POA: Diagnosis present

## 2016-02-24 DIAGNOSIS — Z91041 Radiographic dye allergy status: Secondary | ICD-10-CM

## 2016-02-24 DIAGNOSIS — Z85828 Personal history of other malignant neoplasm of skin: Secondary | ICD-10-CM

## 2016-02-24 DIAGNOSIS — N183 Chronic kidney disease, stage 3 unspecified: Secondary | ICD-10-CM

## 2016-02-24 DIAGNOSIS — Z85840 Personal history of malignant neoplasm of eye: Secondary | ICD-10-CM

## 2016-02-24 DIAGNOSIS — I4891 Unspecified atrial fibrillation: Secondary | ICD-10-CM

## 2016-02-24 DIAGNOSIS — R531 Weakness: Secondary | ICD-10-CM | POA: Diagnosis not present

## 2016-02-24 DIAGNOSIS — Z7982 Long term (current) use of aspirin: Secondary | ICD-10-CM

## 2016-02-24 DIAGNOSIS — I252 Old myocardial infarction: Secondary | ICD-10-CM

## 2016-02-24 DIAGNOSIS — Z87442 Personal history of urinary calculi: Secondary | ICD-10-CM

## 2016-02-24 DIAGNOSIS — I129 Hypertensive chronic kidney disease with stage 1 through stage 4 chronic kidney disease, or unspecified chronic kidney disease: Secondary | ICD-10-CM | POA: Diagnosis present

## 2016-02-24 DIAGNOSIS — Z7951 Long term (current) use of inhaled steroids: Secondary | ICD-10-CM

## 2016-02-24 DIAGNOSIS — H353290 Exudative age-related macular degeneration, unspecified eye, stage unspecified: Secondary | ICD-10-CM

## 2016-02-24 LAB — CBC
HCT: 49.9 % — ABNORMAL HIGH (ref 36.0–46.0)
Hemoglobin: 15.8 g/dL — ABNORMAL HIGH (ref 12.0–15.0)
MCH: 30.9 pg (ref 26.0–34.0)
MCHC: 31.7 g/dL (ref 30.0–36.0)
MCV: 97.5 fL (ref 78.0–100.0)
Platelets: 207 10*3/uL (ref 150–400)
RBC: 5.12 MIL/uL — ABNORMAL HIGH (ref 3.87–5.11)
RDW: 14.9 % (ref 11.5–15.5)
WBC: 12.7 10*3/uL — ABNORMAL HIGH (ref 4.0–10.5)

## 2016-02-24 LAB — BASIC METABOLIC PANEL
Anion gap: 10 (ref 5–15)
BUN: 23 mg/dL — ABNORMAL HIGH (ref 6–20)
CO2: 21 mmol/L — ABNORMAL LOW (ref 22–32)
Calcium: 9.5 mg/dL (ref 8.9–10.3)
Chloride: 112 mmol/L — ABNORMAL HIGH (ref 101–111)
Creatinine, Ser: 1.26 mg/dL — ABNORMAL HIGH (ref 0.44–1.00)
GFR calc Af Amer: 42 mL/min — ABNORMAL LOW (ref >60)
GFR calc non Af Amer: 36 mL/min — ABNORMAL LOW (ref >60)
Glucose, Bld: 105 mg/dL — ABNORMAL HIGH (ref 65–99)
Potassium: 4.1 mmol/L (ref 3.5–5.1)
Sodium: 143 mmol/L (ref 135–145)

## 2016-02-24 LAB — MRSA PCR SCREENING: MRSA by PCR: NEGATIVE

## 2016-02-24 LAB — I-STAT TROPONIN, ED: Troponin i, poc: 0.02 ng/mL (ref 0.00–0.08)

## 2016-02-24 LAB — TSH: TSH: 0.58 u[IU]/mL (ref 0.350–4.500)

## 2016-02-24 LAB — TROPONIN I
Troponin I: <0.03 ng/mL (ref <0.03)
Troponin I: <0.03 ng/mL (ref <0.03)

## 2016-02-24 MED ORDER — INFLUENZA VAC SPLIT QUAD 0.5 ML IM SUSY: 0.5000 mL | PREFILLED_SYRINGE | INTRAMUSCULAR | Status: DC

## 2016-02-24 MED ORDER — MOMETASONE FURO-FORMOTEROL FUM 100-5 MCG/ACT IN AERO
2.0000 | INHALATION_SPRAY | Freq: Two times a day (BID) | RESPIRATORY_TRACT | Status: DC
  Administered 2016-02-25 – 2016-02-27 (×3): 2 via RESPIRATORY_TRACT
  Filled 2016-02-24 (×3): qty 8.8

## 2016-02-24 MED ORDER — ENOXAPARIN SODIUM 40 MG/0.4ML ~~LOC~~ SOLN: 40.0000 mg | SUBCUTANEOUS | Status: DC

## 2016-02-24 MED ORDER — POLYETHYLENE GLYCOL 3350 17 G PO PACK: 17.0000 g | PACK | Freq: Every day | ORAL | Status: DC | PRN

## 2016-02-24 MED ORDER — HEPARIN (PORCINE) IN NACL 100-0.45 UNIT/ML-% IJ SOLN
1000.0000 [IU]/h | INTRAMUSCULAR | Status: DC
  Administered 2016-02-24: 1000 [IU]/h via INTRAVENOUS
  Filled 2016-02-24: qty 250

## 2016-02-24 MED ORDER — ONDANSETRON HCL 4 MG/2ML IJ SOLN: 4.0000 mg | Freq: Four times a day (QID) | INTRAMUSCULAR | Status: DC | PRN

## 2016-02-24 MED ORDER — SODIUM CHLORIDE 0.9% FLUSH
3.0000 mL | Freq: Two times a day (BID) | INTRAVENOUS | Status: DC
  Administered 2016-02-24 – 2016-02-26 (×4): 3 mL via INTRAVENOUS
  Administered 2016-02-27: 10 mL via INTRAVENOUS

## 2016-02-24 MED ORDER — ASPIRIN EC 81 MG PO TBEC
81.0000 mg | DELAYED_RELEASE_TABLET | Freq: Every morning | ORAL | Status: DC
  Administered 2016-02-25: 81 mg via ORAL
  Filled 2016-02-24: qty 1

## 2016-02-24 MED ORDER — HYDROCODONE-ACETAMINOPHEN 5-325 MG PO TABS: 1.0000 | ORAL_TABLET | Freq: Four times a day (QID) | ORAL | Status: DC | PRN

## 2016-02-24 MED ORDER — DEXTROSE 5 % IV SOLN
5.0000 mg/h | INTRAVENOUS | Status: DC
  Administered 2016-02-24 – 2016-02-25 (×2): 5 mg/h via INTRAVENOUS
  Filled 2016-02-24: qty 100

## 2016-02-24 MED ORDER — ACETAMINOPHEN 650 MG RE SUPP: 650.0000 mg | Freq: Four times a day (QID) | RECTAL | Status: DC | PRN

## 2016-02-24 MED ORDER — AMLODIPINE BESYLATE 5 MG PO TABS
5.0000 mg | ORAL_TABLET | Freq: Every morning | ORAL | Status: DC
  Filled 2016-02-24: qty 1

## 2016-02-24 MED ORDER — MUPIROCIN 2 % EX OINT: 1.0000 "application " | TOPICAL_OINTMENT | Freq: Two times a day (BID) | CUTANEOUS | Status: DC

## 2016-02-24 MED ORDER — BISACODYL 5 MG PO TBEC
5.0000 mg | DELAYED_RELEASE_TABLET | Freq: Every day | ORAL | Status: DC | PRN
  Filled 2016-02-24: qty 1

## 2016-02-24 MED ORDER — TRAZODONE HCL 50 MG PO TABS: 25.0000 mg | ORAL_TABLET | Freq: Every evening | ORAL | Status: DC | PRN

## 2016-02-24 MED ORDER — ACETAMINOPHEN 325 MG PO TABS
650.0000 mg | ORAL_TABLET | Freq: Four times a day (QID) | ORAL | Status: DC | PRN
Start: 2016-02-24 — End: 2016-02-27

## 2016-02-24 MED ORDER — ONDANSETRON HCL 4 MG PO TABS: 4.0000 mg | ORAL_TABLET | Freq: Four times a day (QID) | ORAL | Status: DC | PRN

## 2016-02-24 MED ORDER — NITROGLYCERIN 0.4 MG SL SUBL: 0.4000 mg | SUBLINGUAL_TABLET | SUBLINGUAL | Status: DC | PRN

## 2016-02-24 MED ORDER — HEPARIN BOLUS VIA INFUSION
3500.0000 [IU] | Freq: Once | INTRAVENOUS | Status: AC
  Administered 2016-02-24: 3500 [IU] via INTRAVENOUS
  Filled 2016-02-24: qty 3500

## 2016-02-24 MED ORDER — DILTIAZEM HCL 100 MG IV SOLR
5.0000 mg/h | Freq: Once | INTRAVENOUS | Status: AC
  Administered 2016-02-24: 5 mg/h via INTRAVENOUS
  Filled 2016-02-24: qty 100

## 2016-02-24 MED ORDER — CHLORHEXIDINE GLUCONATE CLOTH 2 % EX PADS: 6.0000 | MEDICATED_PAD | Freq: Every day | CUTANEOUS | Status: DC

## 2016-02-24 MED ORDER — METOPROLOL TARTRATE 12.5 MG HALF TABLET: 12.5000 mg | ORAL_TABLET | Freq: Every day | ORAL | Status: DC

## 2016-02-24 NOTE — ED Provider Notes (Signed)
[facility] DEPT Provider Note   CSN: [id number] Arrival date & time: 02/24/16  1119     History   Chief Complaint Chief Complaint  Patient presents with  . Irregular Heart Beat    HPI Shelly Obrien is a 82 y.o. female.  The history is provided by the patient.  Palpitations   This is a new problem. The current episode started more than 2 days ago. The problem occurs daily. The problem has been gradually worsening. Associated symptoms include irregular heartbeat. Pertinent negatives include no diaphoresis, no fever, no chest pain, no syncope, no vomiting and no shortness of breath. She has tried nothing for the symptoms.   Patient is a 82 yo female with h/o CAD, hypertension, hyperlipidemia presents with palptiations for 3 days She feels her heart is racing and "abdomen is tight" No CP/SOB No syncope She reports similar episode last month She reports she just finished prednisone earlier this week for a rash and since that time the palpitations started  Past Medical History:  Diagnosis Date  . Complication of anesthesia    hard to wake   . Coronary artery disease   . H/O: hysterectomy   . Hx of lumpectomy   . Hyperlipidemia   . Hypertension   . Kidney stones   . Myocardial infarct (HCC)    hx of  . PONV (postoperative nausea and vomiting)   . Wears glasses     Patient Active Problem List   Diagnosis Date Noted  . CHOLELITHIASIS 02/14/2009  . HYPERLIPIDEMIA 12/29/2008  . HYPERTENSION 12/29/2008  . MYOCARDIAL INFARCTION, HX OF 12/29/2008  . CAD 12/29/2008    Past Surgical History:  Procedure Laterality Date  . ABDOMINAL HYSTERECTOMY    . ANGIOPLASTY    . BROW LIFT Right 04/07/2014   Procedure: REPAIR OF ENTROPION AND TRICHIASIS OF RIGHT LOWER EYE LID ;  Surgeon: Theodoro Kos, DO;  Location: [facility];  Service: Plastics;  Laterality: Right;  . CARPAL TUNNEL RELEASE     left  . DILATION AND CURETTAGE OF UTERUS    . EYE SURGERY     both  cataracts  . KNEE SURGERY     right    OB History    No data available       Home Medications    Prior to Admission medications   Medication Sig Start Date End Date Taking? Authorizing Provider  amLODipine (NORVASC) 5 MG tablet Take 5 mg by mouth every morning.     Historical Provider, MD  aspirin 81 MG tablet Take 81 mg by mouth every morning.     Historical Provider, MD  cyanocobalamin (V-R VITAMIN B-12) 500 MCG tablet Take 1,000 mcg by mouth every Monday, Wednesday, and Friday.     Historical Provider, MD  Fluticasone-Salmeterol (ADVAIR DISKUS) 100-50 MCG/DOSE AEPB Inhale 1 puff into the lungs 2 (two) times daily as needed. (COLD SYMPTOMS) 06/02/13   Historical Provider, MD  HYDROcodone-acetaminophen (NORCO/VICODIN) 5-325 MG tablet Take 1-2 tablets by mouth every 6 (six) hours as needed for moderate pain. 01/30/16   Lysbeth Penner, FNP  metoprolol (LOPRESSOR) 25 MG tablet Take 0.5 tablets (12.5 mg total) by mouth 2 (two) times daily as needed. Patient taking differently: Take 12.5 mg by mouth daily.  01/15/16   Drenda Freeze, MD  Multiple Vitamins-Minerals (ICAPS) CAPS Take 1 capsule by mouth every morning.     Historical Provider, MD  nitroGLYCERIN (NITROSTAT) 0.4 MG SL tablet Place 0.4 mg under the tongue  every 5 (five) minutes as needed for chest pain.     Historical Provider, MD  ondansetron (ZOFRAN ODT) 8 MG disintegrating tablet Take 1 tablet (8 mg total) by mouth every 8 (eight) hours as needed for nausea or vomiting. 01/24/16   Lajean Saver, MD  polyvinyl alcohol (ARTIFICIAL TEARS) 1.4 % ophthalmic solution Place 1 drop into the right eye 2 (two) times daily.    Historical Provider, MD  tamsulosin (FLOMAX) 0.4 MG CAPS capsule Take 1 capsule (0.4 mg total) by mouth daily. 01/24/16   Lajean Saver, MD  triamcinolone (NASACORT ALLERGY 24HR) 55 MCG/ACT AERO nasal inhaler Place 2 sprays into the nose daily as needed (allergies).    Historical Provider, MD    Family  History Family History  Problem Relation Age of Onset  . Coronary artery disease      positive family hx of  . Cancer      family hx of    Social History Social History  Substance Use Topics  . Smoking status: Never Smoker  . Smokeless tobacco: Never Used  . Alcohol use No     Allergies   Antihistamines, chlorpheniramine-type; Penicillins; Contrast media [iodinated diagnostic agents]; Sulfonamide derivatives; and Pheniramine   Review of Systems Review of Systems  Constitutional: Negative for diaphoresis and fever.  Respiratory: Negative for shortness of breath.   Cardiovascular: Positive for palpitations. Negative for chest pain and syncope.  Gastrointestinal: Negative for vomiting.  Neurological: Negative for syncope.  All other systems reviewed and are negative.    Physical Exam Updated Vital Signs BP 116/79   Pulse (!) 48   Temp 98.1 F (36.7 C) (Oral)   Resp 14   SpO2 97%   Physical Exam CONSTITUTIONAL: Well developed/well nourished, elderly but appears younger than stated age HEAD: Normocephalic/atraumatic EYES: EOMI ENMT: Mucous membranes moist NECK: supple no meningeal signs SPINE/BACK:entire spine nontender [id number], irregular LUNGS: Lungs are clear to auscultation bilaterally, no apparent distress ABDOMEN: soft, nontender, no rebound or guarding, bowel sounds noted throughout abdomen NEURO: Pt is awake/alert/appropriate, moves all extremitiesx4.  No facial droop.   EXTREMITIES: pulses normal/equal, full ROM SKIN: warm, color normal PSYCH: no abnormalities of mood noted, alert and oriented to situation   ED Treatments / Results  Labs (all labs ordered are listed, but only abnormal results are displayed) Labs Reviewed  BASIC METABOLIC PANEL - Abnormal; Notable for the following:       Result Value   Chloride 112 (*)    CO2 21 (*)    Glucose, Bld 105 (*)    BUN 23 (*)    Creatinine, Ser 1.26 (*)    GFR calc non Af Amer 36 (*)    GFR  calc Af Amer 42 (*)    All other components within normal limits  CBC - Abnormal; Notable for the following:    WBC 12.7 (*)    RBC 5.12 (*)    Hemoglobin 15.8 (*)    HCT 49.9 (*)    All other components within normal limits  TROPONIN I  TROPONIN I  TROPONIN I  TSH  I-STAT TROPOININ, ED    EKG  EKG Interpretation  Date/Time:  Friday February 24 2016 11:27:28 EDT Ventricular Rate:  132 PR Interval:    QRS Duration: 98 QT Interval:  304 QTC Calculation: 450 R Axis:   -72 Text Interpretation:  Atrial fibrillation with rapid ventricular response Left axis deviation Minimal voltage criteria for LVH, may be normal variant Nonspecific ST abnormality Abnormal ECG  changed from prior Confirmed by [facility]  MD, [place] ([id number]) on 02/24/2016 11:53:36 AM       Radiology No results found.  Procedures Procedures (including critical care time) CRITICAL CARE Performed by: Sharyon Cable Total critical care time: 35 minutes Critical care time was exclusive of separately billable procedures and treating other patients. Critical care was necessary to treat or prevent imminent or life-threatening deterioration. Critical care was time spent personally by me on the following activities: development of treatment plan with patient and/or surrogate as well as nursing, discussions with consultants, evaluation of patient's response to treatment, examination of patient, obtaining history from patient or surrogate, ordering and performing treatments and interventions, ordering and review of laboratory studies, ordering and review of radiographic studies, pulse oximetry and re-evaluation of patient's condition. PATIENT WITH NEW ONSET ATRIAL FIBRILLATION WITH TACHYCARDIA AND REQUIRED IV CARDIZEM DRIP   Initial Impression / Assessment and Plan / ED Course  I have reviewed the triage vital signs and the nursing notes.  Pertinent labs & imaging results that were available during my care of the patient  were reviewed by me and considered in my medical decision making (see chart for details).  Clinical Course    Pt with what appears to be new onset a-fib with RVR She was seen in ED august 2017 for palptiations but was in sinus at that time This started at least 3 days ago Will need admission for rate control  This patients CHA2DS2-VASc Score and unadjusted Ischemic Stroke Rate (% per year) is equal to 4.8 % stroke rate/year from a score of 4  Above score calculated as 1 point each if present [CHF, HTN, DM, Vascular=MI/PAD/Aortic Plaque, Age if 65-74, or Female] Above score calculated as 2 points each if present [Age > 75, or Stroke/TIA/TE]   D/W TRIAD FOR ADMISSION  Final Clinical Impressions(s) / ED Diagnoses   Final diagnoses:  Atrial fibrillation with RVR [facility])    New Prescriptions New Prescriptions   No medications on file     Ripley Fraise, MD 02/24/16 1501

## 2016-02-24 NOTE — ED Notes (Signed)
MD at bedside. 

## 2016-02-24 NOTE — ED Notes (Signed)
Brought patient back to room via wheelchair; patient undressed, in gown, on monitor, continuous pulse oximetry and blood pressure cuff 

## 2016-02-24 NOTE — Consult Note (Signed)
Cardiology Consult    Patient ID: Shelly Obrien MRN: [id number], DOB/AGE: June 22, 1922   Admit date: 02/24/2016 Date of Consult: 02/24/2016  Primary Physician: Irven Shelling, MD Reason for Consult: atrial fibrillation Primary Cardiologist: Dr. [name] Requesting Provider: Dr. Christy Gentles  Patient Profile  Shelly Obrien is a 82 year old female with a past medical history of CAD with remote stenting to her LAD, HTN, HLD, and right eye cancer. She presents to the ED on 02/24/16 with palpitations and was found to be in atrial fibrillation with RVR, rates in 98-150 range.   History of Present Illness  Shelly Obrien tells me that she began to feel her heart beating "real hard in my chest" about 3 days ago. She was sitting in her home when the palpitations started. Palpitations have been constant since, she denies dyspnea with exertion, dizziness, chest pain and nausea.   Today, she stopped at a fire station to get her BP and HR checked and the fire fighters told her that she needed to go to the ED as her heart rate was elevated.   Last month, patient was diagnosed with kidney stones and had severe pain associated with this. [name] on 01/15/16, a few days before her diagnosis of kidney stones, she was seen in the ED with palpitations and dizziness. EMS had been called to her home earlier that day and EMS said she was in atrial fibrillation but she declined transport to the hospital. Later that day, her family took her to the ED and she was in NSR. EKG from that day looks like she in in NSR with frequent PVC's.   She is very active. She was mowing her lawn up until a few months ago. She has recently moved to an independent living apartment for seniors and no longer has yard work to do, but is still fully capable according to the patient. She is independent in preforming all her ADL's.   Past Medical History   Past Medical History:  Diagnosis Date  . Complication of anesthesia    hard to wake   .  Coronary artery disease   . H/O: hysterectomy   . Hx of lumpectomy   . Hyperlipidemia   . Hypertension   . Kidney stones   . Myocardial infarct (HCC)    hx of  . PONV (postoperative nausea and vomiting)   . Wears glasses     Past Surgical History:  Procedure Laterality Date  . ABDOMINAL HYSTERECTOMY    . ANGIOPLASTY    . BROW LIFT Right 04/07/2014   Procedure: REPAIR OF ENTROPION AND TRICHIASIS OF RIGHT LOWER EYE LID ;  Surgeon: Theodoro Kos, DO;  Location: [facility];  Service: Plastics;  Laterality: Right;  . CARPAL TUNNEL RELEASE     left  . DILATION AND CURETTAGE OF UTERUS    . EYE SURGERY     both cataracts  . KNEE SURGERY     right     Allergies  Allergies  Allergen Reactions  . Antihistamines, Chlorpheniramine-Type     Other reaction(s): Other (See Comments) Passed out, hospital  . Penicillins Anaphylaxis and Swelling    Has patient had a PCN reaction causing immediate rash, facial/tongue/throat swelling, SOB or lightheadedness with hypotension: yes Has patient had a PCN reaction causing severe rash involving mucus membranes or skin necrosis:NO Has patient had a PCN reaction that required hospitalization NO Has patient had a PCN reaction occurring within the last 10 years:NO If all of  the above answers are "NO", then may proceed with Cephalosporin use.  . Contrast Media [Iodinated Diagnostic Agents] Hives  . Sulfonamide Derivatives Other (See Comments)    Unknown   . Pheniramine Other (See Comments)    Other reaction(s): Other (See Comments) Passed out, hospital    Inpatient Medications    . amLODipine  5 mg Oral q morning - 10a  . [START ON 02/25/2016] aspirin EC  81 mg Oral q morning - 10a  . mometasone-formoterol  2 puff Inhalation BID    Family History    Family History  Problem Relation Age of Onset  . Coronary artery disease      positive family hx of  . Cancer      family hx of    Social History    Social History   Social  History  . Marital status: Widowed    Spouse name: N/A  . Number of children: N/A  . Years of education: N/A   Occupational History  . Not on file.   Social History Main Topics  . Smoking status: Never Smoker  . Smokeless tobacco: Never Used  . Alcohol use No  . Drug use: No  . Sexual activity: Yes    Birth control/ protection: Post-menopausal, Surgical   Other Topics Concern  . Not on file   Social History Narrative  . No narrative on file     Review of Systems    General:  No chills, fever, night sweats or weight changes.  Cardiovascular:  No chest pain, dyspnea on exertion, edema, orthopnea, + palpitations, paroxysmal nocturnal dyspnea. Dermatological: No rash, lesions/masses Respiratory: No cough, dyspnea Urologic: No hematuria, dysuria Abdominal:   No nausea, vomiting, diarrhea, bright red blood per rectum, melena, or hematemesis Neurologic:  No visual changes, wkns, changes in mental status. All other systems reviewed and are otherwise negative except as noted above.  Physical Exam    Blood pressure 129/84, pulse 98, temperature 98.1 F (36.7 C), temperature source Oral, resp. rate 18, SpO2 97 %.  General: Pleasant, NAD Psych: Normal affect. Neuro: Alert and oriented X 3. Moves all extremities spontaneously. HEENT: Normal  Neck: Supple without bruits or JVD. Lungs:  Resp regular and unlabored, CTA. Heart: IRRR no s3, s4, or murmurs. Abdomen: Soft, non-tender, non-distended, BS + x 4.  Extremities: No clubbing, cyanosis or edema. DP/PT/Radials 2+ and equal bilaterally.  Labs    Troponin [facility] of Care Test)  Recent Labs  02/24/16 1133  TROPIPOC 0.02    Lab Results  Component Value Date   WBC 12.7 (H) 02/24/2016   HGB 15.8 (H) 02/24/2016   HCT 49.9 (H) 02/24/2016   MCV 97.5 02/24/2016   PLT 207 02/24/2016    Recent Labs Lab 02/24/16 1130  NA 143  K 4.1  CL 112*  CO2 21*  BUN 23*  CREATININE 1.26*  CALCIUM 9.5  GLUCOSE 105*       Radiology Studies    No results found.  EKG & Cardiac Imaging    EKG: Atrial fibrillation with rate of 130.   Echocardiogram: pending  Assessment & Plan    1. Paroxsymal atrial fibrillation: Hesitant to call this new onset atrial fibrillation as she was seen in the ED for palpitations on 01/15/16, she was noted to be in NSR that day, but earlier in the day EMS was called to her home and she was noted to be in Afib. Although, no EKG confirms this from that day, she had the same  symptoms as she presents with today. She has recently had a reaction to Keflex that required oral steroids, last dose was taken today.   Will continue diltiazem gtt that was started in the ED. Titrate for HR <100. She is on metoprolol at home, continue this at admission.   This patients CHA2DS2-VASc Score and unadjusted Ischemic Stroke Rate (% per year) is equal to 7.2 % stroke rate/year from a score of 5 Above score calculated as 1 point each if present [CHF, HTN, DM, Vascular=MI/PAD/Aortic Plaque, Age if 65-74, or Female], 2 points each if present [Age > 75, or Stroke/TIA/TE]  Would start heparin now, will likely need anticoagulation with NOAC. Would favor Eliquis 2.5mg  BID as she has some renal insufficieny and is >age 54.   Will check TSH to rule out hyperthyroidism.   2. HTN: Hypertensive currently. Continue metoprolol, and diltiazem gtt.   3. History of CAD s/p remote PCI to her LAD: On daily ASA.    Signed, Arbutus Leas, NP 02/24/2016, 1:39 PM Pager: [phone]   Patient seen and examined. Agree with assessment and plan.  Shelly Obrien is a very pleasant 82 year old Caucasian female was a former patient of Dr. Lia Foyer and had undergone remote PTCA to her LAD over 20 years ago  (She states that she had never had a stent).  She has remained active and has a history of hypertension, hyperlipidemia, and right eye cancer.  The last 3 days she has noticed increased heart rate and palpitations.  She  presented to the emergency room today and was found to be in atrial fibrillation with ventricular response that was fast with rates into the 130s to 150.  She has been started on IV Cardizem drip and intravenous heparin.  Her ventricular rate has improved and is now around 90.  Her initial ECG revealed atrial fibrillation at 1 32 bpm with nonspecific T changes and borderline LVH.  QTc interval is 450 ms.  She denies any recent anginal type chest pain.  He denies presyncope or syncope.  She has continued to be active and was cutting her grass up to several months ago.  She is now living in an independent living apartment.  An echocardiogram has been scheduled but not yet done.  Laboratory is notable for creatinine of 1.26, giving an estimated GFR at 36.  Troponins are negative.  TSH is 0.58.  Her hemoglobin and hematocrit are stable at 15.8/49.9.  It appears that she has been in atrial fibrillation for at least 3 days duration.  Her cha2ds2vasc score is 5.  I discussed potential stroke risk with PAF.  She has not had any recent falls and  is amenable to consider initiating oral anticoagulation therapy.  She will be admitted to telemetry.  Further recommendations will be made upon her clinical status.   Troy Sine, MD, [facility] 02/24/2016 4:50 PM

## 2016-02-24 NOTE — Progress Notes (Signed)
ANTICOAGULATION CONSULT NOTE - Initial Consult  Pharmacy Consult for heparin  Indication: atrial fibrillation  Allergies  Allergen Reactions  . Antihistamines, Chlorpheniramine-Type     Other reaction(s): Other (See Comments) Passed out, hospital  . Penicillins Anaphylaxis and Swelling    Has patient had a PCN reaction causing immediate rash, facial/tongue/throat swelling, SOB or lightheadedness with hypotension: yes Has patient had a PCN reaction causing severe rash involving mucus membranes or skin necrosis:NO Has patient had a PCN reaction that required hospitalization NO Has patient had a PCN reaction occurring within the last 10 years:NO If all of the above answers are "NO", then may proceed with Cephalosporin use.  . Contrast Media [Iodinated Diagnostic Agents] Hives  . Sulfonamide Derivatives Other (See Comments)    Unknown   . Pheniramine Other (See Comments)    Other reaction(s): Other (See Comments) Passed out, hospital    Patient Measurements: Heparin Dosing Weight: 72 kg (from 01/2016)  Vital Signs: Temp: 98.1 F (36.7 C) (09/22 1127) Temp Source: Oral (09/22 1127) BP: 166/126 (09/22 1415) Pulse Rate: 96 (09/22 1415)  Labs:  Recent Labs  02/24/16 1130  HGB 15.8*  HCT 49.9*  PLT 207  CREATININE 1.26*    CrCl ~ 30 mL/min    Medical History: Past Medical History:  Diagnosis Date  . Complication of anesthesia    hard to wake   . Coronary artery disease   . H/O: hysterectomy   . Hx of lumpectomy   . Hyperlipidemia   . Hypertension   . Kidney stones   . Myocardial infarct (HCC)    hx of  . PONV (postoperative nausea and vomiting)   . Wears glasses     Assessment: 82 yo female admitted with palpitations, found to be in Afib with RVR. Heart rate elevated 90-150s. Unsure if this is new onset, but not on oral anticoagulation prior to admission. CBC stable. No overt signs and symptoms of bleeding noted.   Goal of Therapy:  Heparin level 0.3-0.7  units/ml Monitor platelets by anticoagulation protocol: Yes   Plan:  Give 3500 units bolus x 1 Start heparin infusion at 1000 units/hr Check anti-Xa level in 8 hours and daily while on heparin Continue to monitor H&H and platelets  Argie Ramming, PharmD Pharmacy Resident  Pager [phone] 02/24/16 3:16 PM

## 2016-02-24 NOTE — H&P (Signed)
History and Physical    Shelly Obrien [id number] DOB: 1923/03/22 DOA: 02/24/2016  PCP: Irven Shelling, MD  Patient coming from:   Home   Chief Complaint: palpitations  HPI: Shelly Obrien is a 82 y.o. female with a medical history significant for, but not  limited to, HTN, CAD, right eye cancer and nephrolithiasis. Over the last 3 days patient has been experiencing palpitations "in my stomach". No associated dizziness, chest pain or sob. In AFib with RVR in ED, no previous hx of AFIB.  Patient developed a rash after taking medication for kidney stones. Rash treated with steroids, last dose yesterday.   ED Course:  afebrile, HR 98-150, BP has been intermittently elevated in ED up to 139 /105. 02 sats normal on r/a.  ECG- afib with RVR.  cardizem gtt started at 1:15pm  Review of Systems: As per HPI, otherwise 10 point review of systems negative.    Past Medical History:  Diagnosis Date  . Complication of anesthesia    hard to wake   . Coronary artery disease   . H/O: hysterectomy   . Hx of lumpectomy   . Hyperlipidemia   . Hypertension   . Kidney stones   . Myocardial infarct (HCC)    hx of  . PONV (postoperative nausea and vomiting)   . Wears glasses     Past Surgical History:  Procedure Laterality Date  . ABDOMINAL HYSTERECTOMY    . ANGIOPLASTY    . BROW LIFT Right 04/07/2014   Procedure: REPAIR OF ENTROPION AND TRICHIASIS OF RIGHT LOWER EYE LID ;  Surgeon: Theodoro Kos, DO;  Location: [facility];  Service: Plastics;  Laterality: Right;  . CARPAL TUNNEL RELEASE     left  . DILATION AND CURETTAGE OF UTERUS    . EYE SURGERY     both cataracts  . KNEE SURGERY     right    Social History   Social History  . Marital status: Widowed    Spouse name: N/A  . Number of children: N/A  . Years of education: N/A   Occupational History  . Not on file.   Social History Main Topics  . Smoking status: Never Smoker  . Smokeless tobacco: Never Used    . Alcohol use No  . Drug use: No  . Sexual activity: Yes    Birth control/ protection: Post-menopausal, Surgical   Other Topics Concern  . Not on file   Social History Narrative  . No narrative on file   Lives alone. No assistive devices needed for ambulation Allergies  Allergen Reactions  . Antihistamines, Chlorpheniramine-Type     Other reaction(s): Other (See Comments) Passed out, hospital  . Penicillins Anaphylaxis and Swelling    Has patient had a PCN reaction causing immediate rash, facial/tongue/throat swelling, SOB or lightheadedness with hypotension: yes Has patient had a PCN reaction causing severe rash involving mucus membranes or skin necrosis:NO Has patient had a PCN reaction that required hospitalization NO Has patient had a PCN reaction occurring within the last 10 years:NO If all of the above answers are "NO", then may proceed with Cephalosporin use.  . Contrast Media [Iodinated Diagnostic Agents] Hives  . Sulfonamide Derivatives Other (See Comments)    Unknown   . Pheniramine Other (See Comments)    Other reaction(s): Other (See Comments) Passed out, hospital    Family History  Problem Relation Age of Onset  . Coronary artery disease      positive family hx  of  . Cancer      family hx of    Prior to Admission medications   Medication Sig Start Date End Date Taking? Authorizing Provider  amLODipine (NORVASC) 5 MG tablet Take 5 mg by mouth every morning.    Yes Historical Provider, MD  aspirin 81 MG tablet Take 81 mg by mouth every morning.    Yes Historical Provider, MD  cyanocobalamin (V-R VITAMIN B-12) 500 MCG tablet Take 1,000 mcg by mouth every Monday, Wednesday, and Friday.    Yes Historical Provider, MD  HYDROcodone-acetaminophen (NORCO/VICODIN) 5-325 MG tablet Take 1-2 tablets by mouth every 6 (six) hours as needed for moderate pain. 01/30/16  Yes Lysbeth Penner, FNP  metoprolol (LOPRESSOR) 25 MG tablet Take 0.5 tablets (12.5 mg total) by mouth 2  (two) times daily as needed. Patient taking differently: Take 12.5 mg by mouth daily.  01/15/16  Yes Drenda Freeze, MD  Multiple Vitamins-Minerals (ICAPS) CAPS Take 1 capsule by mouth every morning.    Yes Historical Provider, MD  nitroGLYCERIN (NITROSTAT) 0.4 MG SL tablet Place 0.4 mg under the tongue every 5 (five) minutes as needed for chest pain.    Yes Historical Provider, MD  ondansetron (ZOFRAN ODT) 8 MG disintegrating tablet Take 1 tablet (8 mg total) by mouth every 8 (eight) hours as needed for nausea or vomiting. 01/24/16  Yes Lajean Saver, MD  polyvinyl alcohol (ARTIFICIAL TEARS) 1.4 % ophthalmic solution Place 1 drop into the right eye 2 (two) times daily.   Yes Historical Provider, MD  triamcinolone (NASACORT ALLERGY 24HR) 55 MCG/ACT AERO nasal inhaler Place 2 sprays into the nose daily as needed (allergies).   Yes Historical Provider, MD  Fluticasone-Salmeterol (ADVAIR DISKUS) 100-50 MCG/DOSE AEPB Inhale 1 puff into the lungs 2 (two) times daily as needed. (COLD SYMPTOMS) 06/02/13   Historical Provider, MD  tamsulosin (FLOMAX) 0.4 MG CAPS capsule Take 1 capsule (0.4 mg total) by mouth daily. Patient not taking: Reported on 02/24/2016 01/24/16   Lajean Saver, MD    Physical Exam: Vitals:   02/24/16 1200 02/24/16 1215 02/24/16 1230 02/24/16 1245  BP: 146/83 (!) 130/101 (!) 139/105 129/84  Pulse: 72 115 116 98  Resp: 12 15 18 18   Temp:      TempSrc:      SpO2: 98% 96% 96% 97%    Constitutional:  Pleasant well developed white female in NAD, calm, comfortable Vitals:   02/24/16 1200 02/24/16 1215 02/24/16 1230 02/24/16 1245  BP: 146/83 (!) 130/101 (!) 139/105 129/84  Pulse: 72 115 116 98  Resp: 12 15 18 18   Temp:      TempSrc:      SpO2: 98% 96% 96% 97%   Eyes: PER, lids and conjunctivae normal ENMT: Mucous membranes are moist. Posterior pharynx clear of any exudate or lesions..  Neck: normal, supple, no masses Respiratory: clear to auscultation bilaterally, no wheezing,  no crackles. Normal respiratory effort. No accessory muscle use.  Cardiovascular: Irregular rate and rhythm, no murmurs / rubs / gallops. No extremity edema. 2+ dorsal pedis pulses.   Abdomen: no tenderness, no masses palpated. No hepatomegaly. Bowel sounds positive.  Musculoskeletal: no clubbing / cyanosis. No joint deformity upper and lower extremities. Good ROM, no contractures. Normal muscle tone.  Skin: no rashes, lesions, ulcers.  Neurologic: CN 2-12 grossly intact. Sensation intact, Strength 5/5 in all 4.  Psychiatric: Normal judgment and insight. Alert and oriented x 3. Normal mood.   Labs on Admission: I have personally reviewed  following labs and imaging studies  Radiological Exams on Admission: No results found.  EKG: Independently reviewed.   EKG Interpretation  Date/Time:  Friday February 24 2016 11:27:28 EDT Ventricular Rate:  132 PR Interval:    QRS Duration: 98 QT Interval:  304 QTC Calculation: 450 R Axis:   -72 Text Interpretation:  Atrial fibrillation with rapid ventricular response Left axis deviation Minimal voltage criteria for LVH, may be normal variant Nonspecific ST abnormality Abnormal ECG changed from prior Confirmed by Christy Gentles  MD, DONALD ([id number]) on 02/24/2016 11:53:36 AM      Assessment/Plan   Active Problems:   Atrial fibrillation with RVR (HCC)   Hypertension   CAD (coronary artery disease)   CKD (chronic kidney disease) stage 3, GFR 30-59 ml/min   Exudative macular degeneration (HCC)   Atrial fibrillation (HCC)      Atrial fibrillation with RVR, new onset. Palpitations x 3 days. Chadsvasc 5            -place in obs- tele -AFib order set utilized -follow up on TSH, echocardiogram -continue Diltiazem gtt  -Cardiology consult placed. They are seeing her now.   HTN. BP intermittently elevated in ED -continue home metoprolol. Will probably get some benefit from the Cardizem gtt as well.    CKD, stage 3. Renal function stable. -minimize use  of nephrotoxic medications.  -am bmet  CAD, s/p remote stent to LAD. No chest pain.  Hx of nephrolithiasis.  Recently developed rash in response to medication and required course of steroids.   Exudate macular degeneration, right eye. Avastin injections at [facility] in 2012.   Hx of basal cell cancer under right eye Surgery has left her with deformity of right lower conjunctiva.  DVT prophylaxis:   Lovenox  Code Status:     DNI. Chest compressions, ACLS meds, defibrillaton okay. Family Communication:    none  Disposition Plan:   Discharge home in 24 -48 hours             Consults called:  [place]  Admission status:   Observation -Telemetry    Tye Savoy NP [facility] Pager [phone]  If 7PM-7AM, please contact night-coverage [email or web address] Password [email or web address]  02/24/2016, 1:39 PM

## 2016-02-24 NOTE — ED Triage Notes (Signed)
Pt here with c/o rapid heart rate "I feel it in my stomach." She states it started this morning. She denies having a history of afib. Denies CP.

## 2016-02-25 DIAGNOSIS — I4891 Unspecified atrial fibrillation: Secondary | ICD-10-CM | POA: Diagnosis not present

## 2016-02-25 DIAGNOSIS — N183 Chronic kidney disease, stage 3 (moderate): Secondary | ICD-10-CM | POA: Diagnosis not present

## 2016-02-25 DIAGNOSIS — I10 Essential (primary) hypertension: Secondary | ICD-10-CM | POA: Diagnosis not present

## 2016-02-25 DIAGNOSIS — I2510 Atherosclerotic heart disease of native coronary artery without angina pectoris: Secondary | ICD-10-CM | POA: Diagnosis not present

## 2016-02-25 LAB — CBC
HCT: 45.3 % (ref 36.0–46.0)
Hemoglobin: 14.4 g/dL (ref 12.0–15.0)
MCH: 30.4 pg (ref 26.0–34.0)
MCHC: 31.8 g/dL (ref 30.0–36.0)
MCV: 95.8 fL (ref 78.0–100.0)
Platelets: 162 10*3/uL (ref 150–400)
RBC: 4.73 MIL/uL (ref 3.87–5.11)
RDW: 14.8 % (ref 11.5–15.5)
WBC: 8.8 10*3/uL (ref 4.0–10.5)

## 2016-02-25 LAB — URINALYSIS, ROUTINE W REFLEX MICROSCOPIC
Bilirubin Urine: NEGATIVE
Glucose, UA: NEGATIVE mg/dL
Ketones, ur: NEGATIVE mg/dL
Nitrite: NEGATIVE
Protein, ur: NEGATIVE mg/dL
Specific Gravity, Urine: 1.018 (ref 1.005–1.030)
pH: 5.5 (ref 5.0–8.0)

## 2016-02-25 LAB — URINE MICROSCOPIC-ADD ON

## 2016-02-25 LAB — BASIC METABOLIC PANEL
Anion gap: 11 (ref 5–15)
BUN: 19 mg/dL (ref 6–20)
CO2: 24 mmol/L (ref 22–32)
Calcium: 9.1 mg/dL (ref 8.9–10.3)
Chloride: 109 mmol/L (ref 101–111)
Creatinine, Ser: 1.07 mg/dL — ABNORMAL HIGH (ref 0.44–1.00)
GFR calc Af Amer: 51 mL/min — ABNORMAL LOW (ref >60)
GFR calc non Af Amer: 44 mL/min — ABNORMAL LOW (ref >60)
Glucose, Bld: 107 mg/dL — ABNORMAL HIGH (ref 65–99)
Potassium: 3.9 mmol/L (ref 3.5–5.1)
Sodium: 144 mmol/L (ref 135–145)

## 2016-02-25 LAB — HEPARIN LEVEL (UNFRACTIONATED)
Heparin Unfractionated: 0.41 IU/mL (ref 0.30–0.70)
Heparin Unfractionated: 0.52 IU/mL (ref 0.30–0.70)

## 2016-02-25 LAB — TROPONIN I: Troponin I: <0.03 ng/mL (ref <0.03)

## 2016-02-25 LAB — GLUCOSE, CAPILLARY: Glucose-Capillary: 118 mg/dL — ABNORMAL HIGH (ref 65–99)

## 2016-02-25 MED ORDER — APIXABAN 2.5 MG PO TABS
2.5000 mg | ORAL_TABLET | Freq: Two times a day (BID) | ORAL | Status: DC
  Administered 2016-02-25 – 2016-02-27 (×5): 2.5 mg via ORAL
  Filled 2016-02-25 (×5): qty 1

## 2016-02-25 MED ORDER — METOPROLOL TARTRATE 50 MG PO TABS
50.0000 mg | ORAL_TABLET | Freq: Every day | ORAL | Status: DC
  Administered 2016-02-25: 50 mg via ORAL
  Filled 2016-02-25: qty 1

## 2016-02-25 MED ORDER — METOPROLOL TARTRATE 25 MG PO TABS
25.0000 mg | ORAL_TABLET | Freq: Two times a day (BID) | ORAL | Status: DC
  Administered 2016-02-25: 25 mg via ORAL
  Filled 2016-02-25: qty 1

## 2016-02-25 NOTE — Progress Notes (Signed)
   Subjective:   Feels good . Wants to go home. Still in AF. HR 70-80s on metoprolol. IV dilt stopped. On heparin.  No dyspnea or palpitations.     Intake/Output Summary (Last 24 hours) at 02/25/16 1244 Last data filed at 02/25/16 1200  Gross per 24 hour  Intake           429.25 ml  Output                0 ml  Net           429.25 ml    Current meds: . aspirin EC  81 mg Oral q morning - 10a  . Influenza vac split quadrivalent PF  0.5 mL Intramuscular Tomorrow-1000  . metoprolol tartrate  50 mg Oral Daily  . mometasone-formoterol  2 puff Inhalation BID  . sodium chloride flush  3 mL Intravenous Q12H   Infusions: . diltiazem (CARDIZEM) infusion Stopped (02/25/16 1002)  . heparin 1,000 Units/hr (02/24/16 1900)     Objective:  Blood pressure (!) 117/98, pulse 88, temperature 97.8 F (36.6 C), temperature source Oral, resp. rate 13, height 5\' 5"  (1.651 m), weight 70.5 kg (155 lb 6.4 oz), SpO2 97 %. Weight change:   Physical Exam: General:  Well appearing. No resp difficulty  Lying flat in bed HEENT: normal Neck: supple. JVP 7. Carotids 2+ bilat; no bruits. No lymphadenopathy or thryomegaly appreciated. Cor: PMI nondisplaced. Irregular rate & rhythm. No rubs, gallops or murmurs. Lungs: clear Abdomen: soft, nontender, nondistended. No hepatosplenomegaly. No bruits or masses. Good bowel sounds. Extremities: no cyanosis, clubbing, rash, edema  LLE scar Neuro: alert & orientedx3, cranial nerves grossly intact. moves all 4 extremities w/o difficulty. Affect pleasant  Telemetry: AF 70-80s   Lab Results: Basic Metabolic Panel:  Recent Labs Lab 02/24/16 1130 02/25/16 0400  NA 143 144  K 4.1 3.9  CL 112* 109  CO2 21* 24  GLUCOSE 105* 107*  BUN 23* 19  CREATININE 1.26* 1.07*  CALCIUM 9.5 9.1   Liver Function Tests: No results for input(s): AST, ALT, ALKPHOS, BILITOT, PROT, ALBUMIN in the last 168 hours. No results for input(s): LIPASE, AMYLASE in the last 168 hours. No  results for input(s): AMMONIA in the last 168 hours. CBC:  Recent Labs Lab 02/24/16 1130 02/25/16 0400  WBC 12.7* 8.8  HGB 15.8* 14.4  HCT 49.9* 45.3  MCV 97.5 95.8  PLT 207 162   Cardiac Enzymes:  Recent Labs Lab 02/24/16 1429 02/24/16 2016 02/25/16 0400  TROPONINI <0.03 <0.03 <0.03   BNP: Invalid input(s): POCBNP CBG:  Recent Labs Lab 02/25/16 [place]   Microbiology: No results found for: CULT No results for input(s): CULT, SDES in the last 168 hours.  Imaging: No results found.   ASSESSMENT:  1. PAF  --CHADSVASC = 5 2. CAD s/p remote stenting of LAD 3. CKD, stage III-IV 4. H/o eye cancer  PLAN/DISCUSSION:  Rate is improved.  Got lopressor (short-acting) 50 mg this am. Is on 12.5 daily at home. Will change to 25 bid. I am hesitant to push too hard as she is at high risk for tachy-brady with her age. Switch heparin to Eliquis 2.5 bid. Can go to tele.  Will order echo.  Possibly home tomorrow.    LOS: 0 days    Glori Bickers, MD 02/25/2016, 12:44 PM

## 2016-02-25 NOTE — Progress Notes (Signed)
ANTICOAGULATION CONSULT NOTE - Follow-up Consult  Pharmacy Consult for heparin  Indication: atrial fibrillation  Allergies  Allergen Reactions  . Antihistamines, Chlorpheniramine-Type     Other reaction(s): Other (See Comments) Passed out, hospital  . Penicillins Anaphylaxis and Swelling    Has patient had a PCN reaction causing immediate rash, facial/tongue/throat swelling, SOB or lightheadedness with hypotension: yes Has patient had a PCN reaction causing severe rash involving mucus membranes or skin necrosis:NO Has patient had a PCN reaction that required hospitalization NO Has patient had a PCN reaction occurring within the last 10 years:NO If all of the above answers are "NO", then may proceed with Cephalosporin use.  . Contrast Media [Iodinated Diagnostic Agents] Hives  . Sulfonamide Derivatives Other (See Comments)    Unknown   . Pheniramine Other (See Comments)    Other reaction(s): Other (See Comments) Passed out, hospital    Patient Measurements: Heparin Dosing Weight: 72 kg (from 01/2016)  Vital Signs: Temp: 98.1 F (36.7 C) (09/23 0400) Temp Source: Oral (09/23 0400) BP: 126/103 (09/23 0500)  Labs:  Recent Labs  02/24/16 1130 02/24/16 1429 02/24/16 2016 02/24/16 2328 02/25/16 0400  HGB 15.8*  --   --   --  14.4  HCT 49.9*  --   --   --  45.3  PLT 207  --   --   --  162  HEPARINUNFRC  --   --   --  0.41 0.52  CREATININE 1.26*  --   --   --  1.07*  TROPONINI  --  <0.03 <0.03  --  <0.03   Assessment: 82 yo female on heparin for new afib. Heparin level therapeutic (0.52) x 2 on gtt at 1000 units/hr. CBC wnl and stable, no bleeding noted.   Goal of Therapy:  Heparin level 0.3-0.7 units/ml Monitor platelets by anticoagulation protocol: Yes   Plan:  Continue heparin at 1000 units/hr Will f/u daily heparin level and CBC F/u plans for long term AC  Setareh Rom K. Velva Harman, PharmD, BCPS, CPP Clinical Pharmacist Pager: [phone] Phone:  [phone] 02/25/2016 9:14 AM

## 2016-02-25 NOTE — Progress Notes (Signed)
PROGRESS NOTE        PATIENT DETAILS Name: HALEH ZELL Age: 82 y.o. Sex: female Date of Birth: Oct 04, 1922 Admit Date: 02/24/2016 Admitting Physician Albertine Patricia, MD [id number] Broadus John, MD  Brief Narrative: Patient is a 82 y.o. female with history of CAD status post PCI in the past, hypertension who was admitted on 9/22 for A. fib with RVR.  Subjective: Doing well, wants to go home. Still in A. fib.  Assessment/Plan: Active Problems: Atrial fibrillation with rapid ventricular response: Remains in A. fib but rate controlled. Titrated off Cardizem infusion, will attempt to rate controlled with oral metoprolol.CHADSVASC = 5-initially on intravenous heparin, this has been discontinued and patient has been started on request. Although 89, she still very functionally active and with no history of falls. Await echo.  Hypertension: Controlled, continue metoprolol.  Chronic kidney disease stage III: Creatinine close to baseline, follow periodically.  History of CAD status post remote PCI: No chest pain or shortness of breath. Not on aspirin-as patient will be on full dose anticoagulation.  History of COPD: Lungs are clear-continue bronchodilators as needed.  DVT Prophylaxis: Full dose anticoagulation with Eliquis  Code Status: DNI  Family Communication: None at bedside  Disposition Plan: Remain inpatient-transfer to telemetry, home likely on 9/24.  Antimicrobial agents: None  Procedures: None  CONSULTS:  cardiology  Time spent: 25 minutes-Greater than 50% of this time was spent in counseling, explanation of diagnosis, planning of further management, and coordination of care.  MEDICATIONS: Anti-infectives    None      Scheduled Meds: . apixaban  2.5 mg Oral BID  . Influenza vac split quadrivalent PF  0.5 mL Intramuscular Tomorrow-1000  . metoprolol tartrate  25 mg Oral BID  . mometasone-formoterol  2 puff Inhalation BID  .  sodium chloride flush  3 mL Intravenous Q12H   Continuous Infusions: . diltiazem (CARDIZEM) infusion Stopped (02/25/16 1002)   PRN Meds:.acetaminophen **OR** acetaminophen, bisacodyl, HYDROcodone-acetaminophen, nitroGLYCERIN, ondansetron **OR** ondansetron (ZOFRAN) IV, polyethylene glycol, traZODone   PHYSICAL EXAM: Vital signs: Vitals:   02/25/16 1000 02/25/16 1100 02/25/16 1200 02/25/16 1300  BP: 113/80 (!) 117/98  128/90  Pulse:      Resp: 15 13  17   Temp:   97.8 F (36.6 C)   TempSrc:   Oral   SpO2:      Weight:      Height:       Filed Weights   02/24/16 1800 02/25/16 0450  Weight: 70.8 kg (156 lb 1.4 oz) 70.5 kg (155 lb 6.4 oz)   Body mass index is 25.86 kg/m.   General appearance :Awake, alert, not in any distress. Speech Clear. Not toxic Looking Eyes:, pupils equally reactive to light and accomodation,no scleral icterus.Pink conjunctiva HEENT: Atraumatic and Normocephalic Neck: supple, no JVD. No cervical lymphadenopathy. No thyromegaly Resp:Good air entry bilaterally, no added sounds  CVS: S1 S2 irregular, no murmurs.  GI: Bowel sounds present, Non tender and not distended with no gaurding, rigidity or rebound.No organomegaly Extremities: B/L Lower Ext shows no edema, both legs are warm to touch Neurology:  speech clear,Non focal, sensation is grossly intact. Psychiatric: Normal judgment and insight. Alert and oriented x 3. Normal mood. Musculoskeletal:gait appears to be normal.No digital cyanosis Skin:No Rash, warm and dry Wounds:N/A  I have personally reviewed following labs and imaging studies  LABORATORY DATA: CBC:  Recent Labs Lab 02/24/16 1130 02/25/16 0400  WBC 12.7* 8.8  HGB 15.8* 14.4  HCT 49.9* 45.3  MCV 97.5 95.8  PLT 207 0000000    Basic Metabolic Panel:  Recent Labs Lab 02/24/16 1130 02/25/16 0400  NA 143 144  K 4.1 3.9  CL 112* 109  CO2 21* 24  GLUCOSE 105* 107*  BUN 23* 19  CREATININE 1.26* 1.07*  CALCIUM 9.5 9.1     GFR: Estimated Creatinine Clearance: 33.7 mL/min (by C-G formula based on SCr of 1.07 mg/dL (H)).  Liver Function Tests: No results for input(s): AST, ALT, ALKPHOS, BILITOT, PROT, ALBUMIN in the last 168 hours. No results for input(s): LIPASE, AMYLASE in the last 168 hours. No results for input(s): AMMONIA in the last 168 hours.  Coagulation Profile: No results for input(s): INR, PROTIME in the last 168 hours.  Cardiac Enzymes:  Recent Labs Lab 02/24/16 1429 02/24/16 2016 02/25/16 0400  TROPONINI <0.03 <0.03 <0.03    BNP (last 3 results) No results for input(s): PROBNP in the last 8760 hours.  HbA1C: No results for input(s): HGBA1C in the last 72 hours.  CBG:  Recent Labs Lab 02/25/16 1208  GLUCAP 118*    Lipid Profile: No results for input(s): CHOL, HDL, LDLCALC, TRIG, CHOLHDL, LDLDIRECT in the last 72 hours.  Thyroid Function Tests:  Recent Labs  02/24/16 1429  TSH 0.580    Anemia Panel: No results for input(s): VITAMINB12, FOLATE, FERRITIN, TIBC, IRON, RETICCTPCT in the last 72 hours.  Urine analysis:    Component Value Date/Time   COLORURINE AMBER (A) 01/24/2016 1249   APPEARANCEUR TURBID (A) 01/24/2016 1249   LABSPEC 1.020 01/30/2016 1307   PHURINE 5.5 01/30/2016 1307   GLUCOSEU NEGATIVE 01/30/2016 1307   HGBUR LARGE (A) 01/30/2016 1307   BILIRUBINUR SMALL (A) 01/30/2016 1307   KETONESUR TRACE (A) 01/30/2016 1307   PROTEINUR 30 (A) 01/30/2016 1307   UROBILINOGEN 2.0 (H) 01/30/2016 1307   NITRITE NEGATIVE 01/30/2016 1307   LEUKOCYTESUR SMALL (A) 01/30/2016 1307    Sepsis Labs: Lactic Acid, Venous    Component Value Date/Time   LATICACIDVEN 3.7 (HH) 09/25/2015 1015    MICROBIOLOGY: Recent Results (from the past 240 hour(s))  MRSA PCR Screening     Status: None   Collection Time: 02/24/16  5:34 PM  Result Value Ref Range Status   MRSA by PCR NEGATIVE NEGATIVE Final    Comment:        The GeneXpert MRSA Assay (FDA approved for  NASAL specimens only), is one component of a comprehensive MRSA colonization surveillance program. It is not intended to diagnose MRSA infection nor to guide or monitor treatment for MRSA infections.     RADIOLOGY STUDIES/RESULTS: No results found.   LOS: 0 days   Oren Binet, MD  [facility] Pager:[phone]  If 7PM-7AM, please contact night-coverage [email or web address] Password [email or web address] 02/25/2016, 1:16 PM

## 2016-02-25 NOTE — Progress Notes (Signed)
ANTICOAGULATION CONSULT NOTE - Follow-up Consult  Pharmacy Consult for heparin  Indication: atrial fibrillation  Allergies  Allergen Reactions  . Antihistamines, Chlorpheniramine-Type     Other reaction(s): Other (See Comments) Passed out, hospital  . Penicillins Anaphylaxis and Swelling    Has patient had a PCN reaction causing immediate rash, facial/tongue/throat swelling, SOB or lightheadedness with hypotension: yes Has patient had a PCN reaction causing severe rash involving mucus membranes or skin necrosis:NO Has patient had a PCN reaction that required hospitalization NO Has patient had a PCN reaction occurring within the last 10 years:NO If all of the above answers are "NO", then may proceed with Cephalosporin use.  . Contrast Media [Iodinated Diagnostic Agents] Hives  . Sulfonamide Derivatives Other (See Comments)    Unknown   . Pheniramine Other (See Comments)    Other reaction(s): Other (See Comments) Passed out, hospital    Patient Measurements: Heparin Dosing Weight: 72 kg (from 01/2016)  Vital Signs: BP: 139/98 (09/22 1800) Pulse Rate: 88 (09/22 1700)  Labs:  Recent Labs  02/24/16 1130 02/24/16 1429 02/24/16 2016 02/24/16 2328  HGB 15.8*  --   --   --   HCT 49.9*  --   --   --   PLT 207  --   --   --   HEPARINUNFRC  --   --   --  0.41  CREATININE 1.26*  --   --   --   TROPONINI  --  <0.03 <0.03  --    Assessment: 82 yo female on heparin for afib. Heparin level therapeutic (0.41) on gtt at 1000 units/hr. No bleeding noted.   Goal of Therapy:  Heparin level 0.3-0.7 units/ml Monitor platelets by anticoagulation protocol: Yes   Plan:  Continue heparin at 1000 units/hr Will f/u daily heparin level and CBC  Sherlon Handing, PharmD, BCPS Clinical pharmacist, pager [phone] 02/25/16 12:44 AM

## 2016-02-25 NOTE — Progress Notes (Signed)
Patient refused flu vaccination stating that she already has made an appointment with her PCP to receive the flu shot and she would rather wait and let him give her the vaccination.

## 2016-02-26 ENCOUNTER — Observation Stay (HOSPITAL_COMMUNITY): Payer: Medicare Other

## 2016-02-26 DIAGNOSIS — R002 Palpitations: Secondary | ICD-10-CM | POA: Diagnosis not present

## 2016-02-26 DIAGNOSIS — E785 Hyperlipidemia, unspecified: Secondary | ICD-10-CM | POA: Diagnosis present

## 2016-02-26 DIAGNOSIS — Z7951 Long term (current) use of inhaled steroids: Secondary | ICD-10-CM | POA: Diagnosis not present

## 2016-02-26 DIAGNOSIS — I10 Essential (primary) hypertension: Secondary | ICD-10-CM | POA: Diagnosis not present

## 2016-02-26 DIAGNOSIS — Z85828 Personal history of other malignant neoplasm of skin: Secondary | ICD-10-CM | POA: Diagnosis not present

## 2016-02-26 DIAGNOSIS — I4891 Unspecified atrial fibrillation: Secondary | ICD-10-CM | POA: Diagnosis not present

## 2016-02-26 DIAGNOSIS — H353290 Exudative age-related macular degeneration, unspecified eye, stage unspecified: Secondary | ICD-10-CM | POA: Diagnosis present

## 2016-02-26 DIAGNOSIS — Z85840 Personal history of malignant neoplasm of eye: Secondary | ICD-10-CM | POA: Diagnosis not present

## 2016-02-26 DIAGNOSIS — Z882 Allergy status to sulfonamides status: Secondary | ICD-10-CM | POA: Diagnosis not present

## 2016-02-26 DIAGNOSIS — Z87442 Personal history of urinary calculi: Secondary | ICD-10-CM | POA: Diagnosis not present

## 2016-02-26 DIAGNOSIS — N183 Chronic kidney disease, stage 3 (moderate): Secondary | ICD-10-CM | POA: Diagnosis not present

## 2016-02-26 DIAGNOSIS — J449 Chronic obstructive pulmonary disease, unspecified: Secondary | ICD-10-CM | POA: Diagnosis present

## 2016-02-26 DIAGNOSIS — Z7982 Long term (current) use of aspirin: Secondary | ICD-10-CM | POA: Diagnosis not present

## 2016-02-26 DIAGNOSIS — I2510 Atherosclerotic heart disease of native coronary artery without angina pectoris: Secondary | ICD-10-CM | POA: Diagnosis not present

## 2016-02-26 DIAGNOSIS — Z880 Allergy status to penicillin: Secondary | ICD-10-CM | POA: Diagnosis not present

## 2016-02-26 DIAGNOSIS — I252 Old myocardial infarction: Secondary | ICD-10-CM | POA: Diagnosis not present

## 2016-02-26 DIAGNOSIS — Z91041 Radiographic dye allergy status: Secondary | ICD-10-CM | POA: Diagnosis not present

## 2016-02-26 DIAGNOSIS — N184 Chronic kidney disease, stage 4 (severe): Secondary | ICD-10-CM | POA: Diagnosis present

## 2016-02-26 DIAGNOSIS — Z888 Allergy status to other drugs, medicaments and biological substances status: Secondary | ICD-10-CM | POA: Diagnosis not present

## 2016-02-26 DIAGNOSIS — I480 Paroxysmal atrial fibrillation: Secondary | ICD-10-CM | POA: Diagnosis present

## 2016-02-26 DIAGNOSIS — I129 Hypertensive chronic kidney disease with stage 1 through stage 4 chronic kidney disease, or unspecified chronic kidney disease: Secondary | ICD-10-CM | POA: Diagnosis present

## 2016-02-26 DIAGNOSIS — Z955 Presence of coronary angioplasty implant and graft: Secondary | ICD-10-CM | POA: Diagnosis not present

## 2016-02-26 DIAGNOSIS — Z8249 Family history of ischemic heart disease and other diseases of the circulatory system: Secondary | ICD-10-CM | POA: Diagnosis not present

## 2016-02-26 HISTORY — PX: TRANSTHORACIC ECHOCARDIOGRAM: SHX275

## 2016-02-26 LAB — CBC
HCT: 45.2 % (ref 36.0–46.0)
Hemoglobin: 14.2 g/dL (ref 12.0–15.0)
MCH: 30.4 pg (ref 26.0–34.0)
MCHC: 31.4 g/dL (ref 30.0–36.0)
MCV: 96.8 fL (ref 78.0–100.0)
Platelets: 152 10*3/uL (ref 150–400)
RBC: 4.67 MIL/uL (ref 3.87–5.11)
RDW: 14.8 % (ref 11.5–15.5)
WBC: 7.9 10*3/uL (ref 4.0–10.5)

## 2016-02-26 LAB — ECHOCARDIOGRAM COMPLETE
E decel time: 283 msec
FS: 26 % — AB (ref 28–44)
Height: 65 in
IVS/LV PW RATIO, ED: 1.21
LA ID, A-P, ES: 36 mm
LA diam end sys: 36 mm
LA diam index: 2.03 cm/m2
LA vol A4C: 43.2 ml
LV PW d: 12.2 mm — AB (ref 0.6–1.1)
LVOT area: 4.52 cm2
LVOT diameter: 24 mm
MV Dec: 283
MV Peak grad: 3 mmHg
MV pk E vel: 81.5 m/s
Reg peak vel: 231 cm/s
TR max vel: 231 cm/s
Weight: 2460.8 oz

## 2016-02-26 MED ORDER — PERFLUTREN LIPID MICROSPHERE
1.0000 mL | INTRAVENOUS | Status: AC | PRN
Start: 2016-02-26 — End: 2016-02-26
  Administered 2016-02-26: 2 mL via INTRAVENOUS
  Filled 2016-02-26: qty 10

## 2016-02-26 MED ORDER — METOPROLOL TARTRATE 25 MG PO TABS
25.0000 mg | ORAL_TABLET | Freq: Three times a day (TID) | ORAL | Status: DC
  Administered 2016-02-26 – 2016-02-27 (×5): 25 mg via ORAL
  Filled 2016-02-26 (×5): qty 1

## 2016-02-26 NOTE — Progress Notes (Signed)
   SUBJECTIVE:  Feels shaky this am  OBJECTIVE:   Vitals:   Vitals:   02/25/16 1445 02/25/16 1456 02/25/16 2100 02/26/16 0550  BP:  131/80 135/89 123/82  Pulse:  85 89 76  Resp: 16 18 (!) 21 19  Temp:  97.7 F (36.5 C) 98.5 F (36.9 C) 97.5 F (36.4 C)  TempSrc:  Oral    SpO2:  98% 98% 97%  Weight:    153 lb 12.8 oz (69.8 kg)  Height:       I&O's:   Intake/Output Summary (Last 24 hours) at 02/26/16 0758 Last data filed at 02/26/16 0551  Gross per 24 hour  Intake           735.17 ml  Output              900 ml  Net          -164.83 ml   TELEMETRY: Reviewed telemetry pt in atrial fibrillation with HR 90-120's.:     PHYSICAL EXAM General: Well developed, well nourished, in no acute distress Head: Eyes PERRLA, No xanthomas.   Normal cephalic and atramatic  Lungs:   Clear bilaterally to auscultation and percussion. Heart:   Irregularly irregular S1 S2 Pulses are 2+ & equal. Abdomen: Bowel sounds are positive, abdomen soft and non-tender without masses  Msk:  Back normal, normal gait. Normal strength and tone for age. Extremities:   No clubbing, cyanosis or edema.  DP +1 Neuro: Alert and oriented X 3. Psych:  Good affect, responds appropriately   LABS: Basic Metabolic Panel:  Recent Labs  02/24/16 1130 02/25/16 0400  NA 143 144  K 4.1 3.9  CL 112* 109  CO2 21* 24  GLUCOSE 105* 107*  BUN 23* 19  CREATININE 1.26* 1.07*  CALCIUM 9.5 9.1   Liver Function Tests: No results for input(s): AST, ALT, ALKPHOS, BILITOT, PROT, ALBUMIN in the last 72 hours. No results for input(s): LIPASE, AMYLASE in the last 72 hours. CBC:  Recent Labs  02/25/16 0400 02/26/16 0447  WBC 8.8 7.9  HGB 14.4 14.2  HCT 45.3 45.2  MCV 95.8 96.8  PLT 162 152   Cardiac Enzymes:  Recent Labs  02/24/16 1429 02/24/16 2016 02/25/16 0400  TROPONINI <0.03 <0.03 <0.03   BNP: Invalid input(s): POCBNP D-Dimer: No results for input(s): DDIMER in the last 72 hours. Hemoglobin  A1C: No results for input(s): HGBA1C in the last 72 hours. Fasting Lipid Panel: No results for input(s): CHOL, HDL, LDLCALC, TRIG, CHOLHDL, LDLDIRECT in the last 72 hours. Thyroid Function Tests:  Recent Labs  02/24/16 1429  TSH 0.580   Anemia Panel: No results for input(s): VITAMINB12, FOLATE, FERRITIN, TIBC, IRON, RETICCTPCT in the last 72 hours. Coag Panel:   No results found for: INR, PROTIME  RADIOLOGY: No results found.    ASSESSMENT/PLAN:   1.  PAF - rate in the 90's while sitting but gets up in the 120's with any activity.  Continue Eliquis dosed for CKD and age.  Will increase Lopressor to 25mg  TID in hopes to get better rate control.  Need to watch closely for tachy brady syndrome.   2.  ASCD s/p remote LAD stent - no angina 3.  CKD stage III-IV  Fransico Him, MD  02/26/2016  7:58 AM

## 2016-02-26 NOTE — Progress Notes (Signed)
  Echocardiogram 2D Echocardiogram has been performed.  Jennette Dubin 02/26/2016, 12:43 PM

## 2016-02-26 NOTE — Progress Notes (Signed)
PROGRESS NOTE        PATIENT DETAILS Name: Shelly Obrien Age: 82 y.o. Sex: female Date of Birth: 05-27-1923 Admit Date: 02/24/2016 Admitting Physician Albertine Patricia, MD [id number] Broadus John, MD  Brief Narrative: Patient is a 82 y.o. female with history of CAD status post PCI in the past, hypertension who was admitted on 9/22 for A. fib with RVR.  Subjective: Denies any chest pain or shortness of breath. Anxious to go home, but knows heart rate is still not optimal.  Assessment/Plan: Active Problems: Atrial fibrillation with rapid ventricular response: Remains in A. fib but rate controlled. Initially required Cardizem infusion, now on metoprolol-those being optimized by cardiology.CHADSVASC = 5-initially on intravenous heparin, this has been discontinued and patient has been started Eliquis. Although 89, she still very functionally active and with no history of falls. Await echo.  Hypertension: Controlled, continue metoprolol.  Chronic kidney disease stage III: Creatinine close to baseline, follow periodically.  History of CAD status post remote PCI: No chest pain or shortness of breath. Not on aspirin-as patient will be on full dose anticoagulation.  History of COPD: Lungs are clear-continue bronchodilators as needed.  DVT Prophylaxis: Full dose anticoagulation with Eliquis  Code Status: DNI  Family Communication: None at bedside  Disposition Plan: Remain inpatient- home likely on 9/25.  Antimicrobial agents: None  Procedures: None  CONSULTS:  cardiology  Time spent: 25 minutes-Greater than 50% of this time was spent in counseling, explanation of diagnosis, planning of further management, and coordination of care.  MEDICATIONS: Anti-infectives    None      Scheduled Meds: . apixaban  2.5 mg Oral BID  . Influenza vac split quadrivalent PF  0.5 mL Intramuscular Tomorrow-1000  . metoprolol tartrate  25 mg Oral Q8H  .  mometasone-formoterol  2 puff Inhalation BID  . sodium chloride flush  3 mL Intravenous Q12H   Continuous Infusions:   PRN Meds:.acetaminophen **OR** acetaminophen, bisacodyl, HYDROcodone-acetaminophen, nitroGLYCERIN, ondansetron **OR** ondansetron (ZOFRAN) IV, polyethylene glycol, traZODone   PHYSICAL EXAM: Vital signs: Vitals:   02/25/16 1445 02/25/16 1456 02/25/16 2100 02/26/16 0550  BP:  131/80 135/89 123/82  Pulse:  85 89 76  Resp: 16 18 (!) 21 19  Temp:  97.7 F (36.5 C) 98.5 F (36.9 C) 97.5 F (36.4 C)  TempSrc:  Oral    SpO2:  98% 98% 97%  Weight:    69.8 kg (153 lb 12.8 oz)  Height:       Filed Weights   02/24/16 1800 02/25/16 0450 02/26/16 0550  Weight: 70.8 kg (156 lb 1.4 oz) 70.5 kg (155 lb 6.4 oz) 69.8 kg (153 lb 12.8 oz)   Body mass index is 25.59 kg/m.   General appearance :Awake, alert, not in any distress. Speech Clear. Not toxic Looking Eyes:, pupils equally reactive to light and accomodation,no scleral icterus.Pink conjunctiva HEENT: Atraumatic and Normocephalic Neck: supple, no JVD. No cervical lymphadenopathy. No thyromegaly Resp:Good air entry bilaterally, no added sounds  CVS: S1 S2 irregular, no murmurs.  GI: Bowel sounds present, Non tender and not distended with no gaurding, rigidity or rebound.No organomegaly Extremities: B/L Lower Ext shows no edema, both legs are warm to touch Neurology:  speech clear,Non focal, sensation is grossly intact. Psychiatric: Normal judgment and insight. Alert and oriented x 3. Normal mood. Musculoskeletal:gait appears to be normal.No digital cyanosis Skin:No Rash, warm  and dry Wounds:N/A  I have personally reviewed following labs and imaging studies  LABORATORY DATA: CBC:  Recent Labs Lab 02/24/16 1130 02/25/16 0400 02/26/16 0447  WBC 12.7* 8.8 7.9  HGB 15.8* 14.4 14.2  HCT 49.9* 45.3 45.2  MCV 97.5 95.8 96.8  PLT 207 162 0000000    Basic Metabolic Panel:  Recent Labs Lab 02/24/16 1130  02/25/16 0400  NA 143 144  K 4.1 3.9  CL 112* 109  CO2 21* 24  GLUCOSE 105* 107*  BUN 23* 19  CREATININE 1.26* 1.07*  CALCIUM 9.5 9.1    GFR: Estimated Creatinine Clearance: 33.6 mL/min (by C-G formula based on SCr of 1.07 mg/dL (H)).  Liver Function Tests: No results for input(s): AST, ALT, ALKPHOS, BILITOT, PROT, ALBUMIN in the last 168 hours. No results for input(s): LIPASE, AMYLASE in the last 168 hours. No results for input(s): AMMONIA in the last 168 hours.  Coagulation Profile: No results for input(s): INR, PROTIME in the last 168 hours.  Cardiac Enzymes:  Recent Labs Lab 02/24/16 1429 02/24/16 2016 02/25/16 0400  TROPONINI <0.03 <0.03 <0.03    BNP (last 3 results) No results for input(s): PROBNP in the last 8760 hours.  HbA1C: No results for input(s): HGBA1C in the last 72 hours.  CBG:  Recent Labs Lab 02/25/16 1208  GLUCAP 118*    Lipid Profile: No results for input(s): CHOL, HDL, LDLCALC, TRIG, CHOLHDL, LDLDIRECT in the last 72 hours.  Thyroid Function Tests:  Recent Labs  02/24/16 1429  TSH 0.580    Anemia Panel: No results for input(s): VITAMINB12, FOLATE, FERRITIN, TIBC, IRON, RETICCTPCT in the last 72 hours.  Urine analysis:    Component Value Date/Time   COLORURINE YELLOW 02/25/2016 1816   APPEARANCEUR HAZY (A) 02/25/2016 1816   LABSPEC 1.018 02/25/2016 1816   PHURINE 5.5 02/25/2016 1816   GLUCOSEU NEGATIVE 02/25/2016 1816   HGBUR TRACE (A) 02/25/2016 1816   BILIRUBINUR NEGATIVE 02/25/2016 1816   KETONESUR NEGATIVE 02/25/2016 1816   PROTEINUR NEGATIVE 02/25/2016 1816   UROBILINOGEN 2.0 (H) 01/30/2016 1307   NITRITE NEGATIVE 02/25/2016 1816   LEUKOCYTESUR MODERATE (A) 02/25/2016 1816    Sepsis Labs: Lactic Acid, Venous    Component Value Date/Time   LATICACIDVEN 3.7 (HH) 09/25/2015 1015    MICROBIOLOGY: Recent Results (from the past 240 hour(s))  MRSA PCR Screening     Status: None   Collection Time: 02/24/16  5:34  PM  Result Value Ref Range Status   MRSA by PCR NEGATIVE NEGATIVE Final    Comment:        The GeneXpert MRSA Assay (FDA approved for NASAL specimens only), is one component of a comprehensive MRSA colonization surveillance program. It is not intended to diagnose MRSA infection nor to guide or monitor treatment for MRSA infections.     RADIOLOGY STUDIES/RESULTS: No results found.   LOS: 0 days   Oren Binet, MD  [facility] Pager:[phone]  If 7PM-7AM, please contact night-coverage [email or web address] Password [email or web address] 02/26/2016, 11:01 AM

## 2016-02-27 ENCOUNTER — Telehealth: Payer: Self-pay | Admitting: Physician Assistant

## 2016-02-27 LAB — BASIC METABOLIC PANEL
Anion gap: 7 (ref 5–15)
BUN: 27 mg/dL — ABNORMAL HIGH (ref 6–20)
CO2: 24 mmol/L (ref 22–32)
Calcium: 9.8 mg/dL (ref 8.9–10.3)
Chloride: 111 mmol/L (ref 101–111)
Creatinine, Ser: 1.19 mg/dL — ABNORMAL HIGH (ref 0.44–1.00)
GFR calc Af Amer: 45 mL/min — ABNORMAL LOW (ref >60)
GFR calc non Af Amer: 39 mL/min — ABNORMAL LOW (ref >60)
Glucose, Bld: 129 mg/dL — ABNORMAL HIGH (ref 65–99)
Potassium: 4.2 mmol/L (ref 3.5–5.1)
Sodium: 142 mmol/L (ref 135–145)

## 2016-02-27 LAB — CBC
HCT: 45.6 % (ref 36.0–46.0)
Hemoglobin: 14.3 g/dL (ref 12.0–15.0)
MCH: 30.1 pg (ref 26.0–34.0)
MCHC: 31.4 g/dL (ref 30.0–36.0)
MCV: 96 fL (ref 78.0–100.0)
Platelets: 149 10*3/uL — ABNORMAL LOW (ref 150–400)
RBC: 4.75 MIL/uL (ref 3.87–5.11)
RDW: 14.9 % (ref 11.5–15.5)
WBC: 7.6 10*3/uL (ref 4.0–10.5)

## 2016-02-27 MED ORDER — METOPROLOL TARTRATE 25 MG PO TABS: 25.0000 mg | ORAL_TABLET | Freq: Two times a day (BID) | ORAL | 0 refills | Status: DC

## 2016-02-27 MED ORDER — APIXABAN 2.5 MG PO TABS: 2.5000 mg | ORAL_TABLET | Freq: Two times a day (BID) | ORAL | 0 refills | Status: DC

## 2016-02-27 NOTE — Evaluation (Signed)
Physical Therapy Evaluation Patient Details Name: Shelly Obrien MRN: [id number] DOB: [birth year] Today's Date: 02/27/2016   History of Present Illness  Patient is a 82 y.o. female with history of CAD status post PCI in the past, hypertension who was admitted on 9/22 for A. fib with RVR  Clinical Impression  Patient seen for mobility assessment. Patient with modest instability during higher level balance tasks. No physical assist required. At this time, patient shows no further acute PT needs. HR was stable sinus 90s with activity 70s at rest. Nsg aware.    Follow Up Recommendations No PT follow up    Equipment Recommendations  None recommended by PT    Recommendations for Other Services       Precautions / Restrictions Precautions Precaution Comments: watch HR      Mobility  Bed Mobility               General bed mobility comments: received sitting EOB  Transfers Overall transfer level: Independent Equipment used: None             General transfer comment: no physical assist required  Ambulation/Gait Ambulation/Gait assistance: Modified independent (Device/Increase time) Ambulation Distance (Feet): 380 Feet Assistive device: None Gait Pattern/deviations: Step-through pattern;Decreased stride length;Narrow base of support Gait velocity: decreased   General Gait Details: instability noted, but no physical assist required  Stairs            Wheelchair Mobility    Modified Rankin (Stroke Patients Only)       Balance Overall balance assessment: Modified Independent                                           Pertinent Vitals/Pain Pain Assessment: No/denies pain    Home Living Family/patient expects to be discharged to:: Private residence Living Arrangements: Alone   Type of Home: Independent living facility Home Access: Level entry     Home Layout: One level Home Equipment: None      Prior Function Level of  Independence: Independent               Hand Dominance   Dominant Hand: Right    Extremity/Trunk Assessment   Upper Extremity Assessment: Overall WFL for tasks assessed           Lower Extremity Assessment: Overall WFL for tasks assessed      Cervical / Trunk Assessment: Kyphotic  Communication   Communication: No difficulties  Cognition Arousal/Alertness: Awake/alert Behavior During Therapy: WFL for tasks assessed/performed Overall Cognitive Status: Within Functional Limits for tasks assessed                      General Comments      Exercises     Assessment/Plan    PT Assessment Patent does not need any further PT services  PT Problem List            PT Treatment Interventions      PT Goals (Current goals can be found in the Care Plan section)  Acute Rehab PT Goals PT Goal Formulation: All assessment and education complete, DC therapy    Frequency     Barriers to discharge        Co-evaluation               End of Session Equipment Utilized During Treatment: Gait belt Activity Tolerance: Patient  tolerated treatment well Patient left: in bed;with call bell/phone within reach (sitting EOB) Nurse Communication: Mobility status    Functional Assessment Tool Used: clinical judgement Functional Limitation: Mobility: Walking and moving around Mobility: Walking and Moving Around Current Status [id number]): At least 1 percent but less than 20 percent impaired, limited or restricted Mobility: Walking and Moving Around Goal Status [phone]): At least 1 percent but less than 20 percent impaired, limited or restricted Mobility: Walking and Moving Around Discharge Status [phone]): At least 1 percent but less than 20 percent impaired, limited or restricted    Time: 1148-1206 PT Time Calculation (min) (ACUTE ONLY): 18 min   Charges:   PT Evaluation $PT Eval Low Complexity: 1 Procedure     PT G Codes:   PT G-Codes **NOT FOR INPATIENT  CLASS** Functional Assessment Tool Used: clinical judgement Functional Limitation: Mobility: Walking and moving around Mobility: Walking and Moving Around Current Status [id number]): At least 1 percent but less than 20 percent impaired, limited or restricted Mobility: Walking and Moving Around Goal Status [phone]): At least 1 percent but less than 20 percent impaired, limited or restricted Mobility: Walking and Moving Around Discharge Status [phone]): At least 1 percent but less than 20 percent impaired, limited or restricted    Duncan Dull 02/27/2016, 2:50 PM  Alben Deeds, [place] DPT  [phone]

## 2016-02-27 NOTE — Progress Notes (Signed)
Reviewed discharge instructions with patient and she her understanding. Reinforced with patient to not take ASA with Eliquis.  Spoke with Bonney Leitz PA with cardiology and asked to do Eliquis prior authorization. Discharged home with facility staff via wheelchair.  Sanda Linger

## 2016-02-27 NOTE — Telephone Encounter (Signed)
    Prior auth approved through 05/2016 for Eliquis 2.5mg  BID   Angelena Form PA-C  [facility]

## 2016-02-27 NOTE — Progress Notes (Signed)
Patient Name: Shelly Obrien Date of Encounter: 02/27/2016     Active Problems:   Essential hypertension   CAD (coronary artery disease)   Atrial fibrillation with RVR (HCC)   CKD (chronic kidney disease) stage 3, GFR 30-59 ml/min   Atrial fibrillation ([place])   Exudative macular degeneration ([place])    SUBJECTIVE  Still feels weak and jittery despite rate control. No CP or SOB. Whenever she gets up her HR shoots up  CURRENT MEDS . apixaban  2.5 mg Oral BID  . Influenza vac split quadrivalent PF  0.5 mL Intramuscular Tomorrow-1000  . metoprolol tartrate  25 mg Oral Q8H  . mometasone-formoterol  2 puff Inhalation BID  . sodium chloride flush  3 mL Intravenous Q12H    OBJECTIVE  Vitals:   02/26/16 2135 02/27/16 0517 02/27/16 0518 02/27/16 0732  BP: 113/69 128/87 128/87   Pulse: 88 (!) 102 74   Resp: 18  18   Temp: 98 F (36.7 C)  97.4 F (36.3 C)   TempSrc: Oral  Oral   SpO2: 96%  98% 98%  Weight:   153 lb 12.8 oz (69.8 kg)   Height:        Intake/Output Summary (Last 24 hours) at 02/27/16 0941 Last data filed at 02/26/16 2137  Gross per 24 hour  Intake              243 ml  Output                0 ml  Net              243 ml   Filed Weights   02/25/16 0450 02/26/16 0550 02/27/16 0518  Weight: 155 lb 6.4 oz (70.5 kg) 153 lb 12.8 oz (69.8 kg) 153 lb 12.8 oz (69.8 kg)    PHYSICAL EXAM  General: Pleasant, NAD. Appears younger than state age Neuro: Alert and oriented X 3. Moves all extremities spontaneously. Psych: Normal affect. HEENT:  Normal  Neck: Supple without bruits or JVD. Lungs:  Resp regular and unlabored, CTA. Heart: irreg irreg no s3, s4, or murmurs. Abdomen: Soft, non-tender, non-distended, BS + x 4.  Extremities: No clubbing, cyanosis or edema. DP/PT/Radials 2+ and equal bilaterally.  Accessory Clinical Findings  CBC  Recent Labs  02/26/16 0447 02/27/16 0519  WBC 7.9 7.6  HGB 14.2 14.3  HCT 45.2 45.6  MCV 96.8 96.0  PLT 152 149*    Basic Metabolic Panel  Recent Labs  02/24/16 1130 02/25/16 0400  NA 143 144  K 4.1 3.9  CL 112* 109  CO2 21* 24  GLUCOSE 105* 107*  BUN 23* 19  CREATININE 1.26* 1.07*  CALCIUM 9.5 9.1   Liver Function Tests No results for input(s): AST, ALT, ALKPHOS, BILITOT, PROT, ALBUMIN in the last 72 hours. No results for input(s): LIPASE, AMYLASE in the last 72 hours. Cardiac Enzymes  Recent Labs  02/24/16 1429 02/24/16 2016 02/25/16 0400  TROPONINI <0.03 <0.03 <0.03   Thyroid Function Tests  Recent Labs  02/24/16 1429  TSH 0.580    TELE  afib with CVR  Radiology/Studies  No results found.  2D ECHO: 02/26/2016 LV EF: 55% -   60% Study Conclusions - Left ventricle: The cavity size was normal. There was moderate   focal basal and mild concentric hypertrophy. Systolic function   was normal. The estimated ejection fraction was in the range of   55% to 60%. There is akinesis of the basal-midanteroseptal and  inferoseptal myocardium. There is akinesis of the apicalinferior   myocardium.   ASSESSMENT AND PLAN  1. New onset PAF- rate in the 80's while sitting but gets up in the 120's with any activity. Continue Eliquis dosed for CKD and age (she has had two full days). Continue Lopressor to 25mg  TID. She still feels shaky in afib. Since this is her first episode and she is symptomatic, plan for TEE/DCCV tomorrow at 10am. Orders placed. NPO after midnight. She has normal LA size so hopefully this will stick.   2.  ASCD s/p remote LAD stent - no angina. No ASA given Eliquis use and BB.   3.  CKD stage III-IV - no BMET since admission. Will order.   Signed, Angelena Form PA-C  Pager [phone]  I have seen, examined and evaluated the patient this AM along with Mrs. Grandville Silos, PA-C.  After reviewing all the available data and chart, we discussed the patients laboratory, study & physical findings as well as symptoms in detail. I agree with his findings, examination as  well as impression recommendations as per our discussion.    Very pleasant elderly woman with new onset A. fib with difficult control rates. Her rates have been doing well at rest, but did increase with activity. She was relatively symptomatically with initial plans were for cardioversion tomorrow. Thankfully, shortly before I walked into her room, on telemetry there was indication that she converted to sinus rhythm and indeed she is back in sinus rhythm now on telemetry and notes that her heart rate is stable while standing up.  This is great news is on oral beta blocker for rate control and Eliquis for anticoagulation.  I would like to see her maintain sinus rhythm for a few hours today on current management to ensure that she is truly a symptomatically. If she maintains sinus wearing with stable blood pressure, I would anticipate that she would be stable for discharge this afternoon. Has scheduled follow-up already in place to see Dr. [name] in cardiology clinic on Friday which would be a good close hospital follow-up.     Glenetta Hew, M.D., M.S. Interventional Cardiologist   Pager # [phone] Phone # [phone] [address]. [place], [place] [address]

## 2016-02-27 NOTE — Plan of Care (Signed)
Problem: Cardiac: Goal: Ability to achieve and maintain adequate cardiopulmonary perfusion will improve Outcome: Completed/Met Date Met: 02/27/16 Patient converted to SR this morning.  Ambulating independently in room and hallways.

## 2016-02-27 NOTE — Discharge Instructions (Signed)

## 2016-02-27 NOTE — Discharge Summary (Addendum)
PATIENT DETAILS Name: Shelly Obrien Age: 82 y.o. Sex: female Date of Birth: [birth year] MRN: [id number]. Admitting Physician: Albertine Patricia, MD [id number] Broadus John, MD  Admit Date: 02/24/2016 Discharge date: 02/27/2016  Recommendations for Outpatient Follow-up:  1. Follow up with PCP in 1-2 weeks 2. Please obtain BMP/CBC in one week 3. Please follow up on the following pending results:  Admitted From:  ILF  Disposition: [place]: No  Equipment/Devices: None  Discharge Condition: Stable  CODE STATUS: FULL CODE  Diet recommendation:  Heart Healthy  Brief Summary: See H&P, Labs, Consult and Test reports for all details in brief, Patient is a 82 y.o. female with history of CAD status post PCI in the past, hypertension who was admitted on 9/22 for A. fib with RVR.  Brief Hospital Course: Atrial fibrillation with rapid ventricular response: Initially required Cardizem infusion, now on metoprolol.She has converted to sinus rhythm on the day of discharge. CHADSVASC = 5-initially on intravenous heparin, she has been transitioned to Eliquis. Although 89, she still very functionally active and with no history of falls. Echo showed preserved EF  Hypertension: Controlled, continue metoprolol.  Chronic kidney disease stage III: Creatinine close to baseline, follow periodically.  History of CAD status post remote PCI: No chest pain or shortness of breath. Not on aspirin-as patient will be on full dose anticoagulation. Echocardiogram showed preserved ejection fraction, but did have numerous wall motion abnormalities-discussed with Dr. Brenton Grills thinks these are old findings related to her prior CAD/MI. No further workup needed.  History of COPD: Lungs are clear-continue bronchodilators as needed.  Procedures/Studies: Echo 9/24>> EF 55-60%, akinesis of the basal-mid anteroseptal and inferior septal myocardium.  Discharge Diagnoses:  Active Problems:  Essential hypertension   CAD (coronary artery disease)   Atrial fibrillation with RVR (HCC)   CKD (chronic kidney disease) stage 3, GFR 30-59 ml/min   Atrial fibrillation (HCC)   Exudative macular degeneration (HCC)   Discharge Instructions:  Activity:  As tolerated with Full fall precautions use walker/cane & assistance as needed   Discharge Instructions    Call MD for:  difficulty breathing, headache or visual disturbances    Complete by:  As directed    Call MD for:  persistant dizziness or light-headedness    Complete by:  As directed    Call MD for:  severe uncontrolled pain    Complete by:  As directed    Diet - low sodium heart healthy    Complete by:  As directed    Discharge instructions    Complete by:  As directed    Follow with Primary MD  Irven Shelling, MD  and other consultant's as instructed your Hospitalist MD  Please get a complete blood count and chemistry panel checked by your Primary MD at your next visit, and again as instructed by your Primary MD.  Get Medicines reviewed and adjusted: Please take all your medications with you for your next visit with your Primary MD  Laboratory/radiological data: Please request your Primary MD to go over all hospital tests and procedure/radiological results at the follow up, please ask your Primary MD to get all Hospital records sent to his/her office.  In some cases, they will be blood work, cultures and biopsy results pending at the time of your discharge. Please request that your primary care M.D. follows up on these results.  Also Note the following: If you experience worsening of your admission symptoms, develop shortness of breath, life threatening emergency, suicidal  or homicidal thoughts you must seek medical attention immediately by calling 911 or calling your MD immediately  if symptoms less severe.  You must read complete instructions/literature along with all the possible adverse reactions/side effects  for all the Medicines you take and that have been prescribed to you. Take any new Medicines after you have completely understood and accpet all the possible adverse reactions/side effects.   Do not drive when taking Pain medications or sleeping medications (Benzodaizepines)  Do not take more than prescribed Pain, Sleep and Anxiety Medications. It is not advisable to combine anxiety,sleep and pain medications without talking with your primary care practitioner  Special Instructions: If you have smoked or chewed Tobacco  in the last 2 yrs please stop smoking, stop any regular Alcohol  and or any Recreational drug use.  Wear Seat belts while driving.  Please note: You were cared for by a hospitalist during your hospital stay. Once you are discharged, your primary care physician will handle any further medical issues. Please note that NO REFILLS for any discharge medications will be authorized once you are discharged, as it is imperative that you return to your primary care physician (or establish a relationship with a primary care physician if you do not have one) for your post hospital discharge needs so that they can reassess your need for medications and monitor your lab values.   Increase activity slowly    Complete by:  As directed        Medication List    STOP taking these medications   amLODipine 5 MG tablet Commonly known as:  NORVASC   aspirin 81 MG tablet   HYDROcodone-acetaminophen 5-325 MG tablet Commonly known as:  NORCO/VICODIN     TAKE these medications   ADVAIR DISKUS 100-50 MCG/DOSE Aepb Generic drug:  Fluticasone-Salmeterol Inhale 1 puff into the lungs 2 (two) times daily as needed. (COLD SYMPTOMS)   apixaban 2.5 MG Tabs tablet Commonly known as:  ELIQUIS Take 1 tablet (2.5 mg total) by mouth 2 (two) times daily.   ARTIFICIAL TEARS 1.4 % ophthalmic solution Generic drug:  polyvinyl alcohol Place 1 drop into the right eye 2 (two) times daily.   ICAPS  Caps Take 1 capsule by mouth every morning.   metoprolol tartrate 25 MG tablet Commonly known as:  LOPRESSOR Take 1 tablet (25 mg total) by mouth 2 (two) times daily. What changed:  how much to take  when to take this  reasons to take this   NASACORT ALLERGY 24HR 55 MCG/ACT Aero nasal inhaler Generic drug:  triamcinolone Place 2 sprays into the nose daily as needed (allergies).   nitroGLYCERIN 0.4 MG SL tablet Commonly known as:  NITROSTAT Place 0.4 mg under the tongue every 5 (five) minutes as needed for chest pain.   ondansetron 8 MG disintegrating tablet Commonly known as:  ZOFRAN ODT Take 1 tablet (8 mg total) by mouth every 8 (eight) hours as needed for nausea or vomiting.   tamsulosin 0.4 MG Caps capsule Commonly known as:  FLOMAX Take 1 capsule (0.4 mg total) by mouth daily.   V-R VITAMIN B-12 500 MCG tablet Generic drug:  cyanocobalamin Take 1,000 mcg by mouth every Monday, Wednesday, and Friday.      Follow-up Information    Irven Shelling, MD. Schedule an appointment as soon as possible for a visit in 1 week(s).   Specialty:  Internal Medicine Contact information: [address] [place] [address] [phone]  Dorris Carnes, MD Follow up on 03/02/2016.   Specialty:  Cardiology Why:  Appointment at 3:45 p.m. Contact information: [address] [place] [address] [phone]          Allergies  Allergen Reactions  . Antihistamines, Chlorpheniramine-Type     Other reaction(s): Other (See Comments) Passed out, hospital  . Penicillins Anaphylaxis and Swelling    Has patient had a PCN reaction causing immediate rash, facial/tongue/throat swelling, SOB or lightheadedness with hypotension: yes Has patient had a PCN reaction causing severe rash involving mucus membranes or skin necrosis:NO Has patient had a PCN reaction that required hospitalization NO Has patient had a PCN reaction occurring within the  last 10 years:NO If all of the above answers are "NO", then may proceed with Cephalosporin use.  . Contrast Media [Iodinated Diagnostic Agents] Hives  . Sulfonamide Derivatives Other (See Comments)    Unknown   . Pheniramine Other (See Comments)    Other reaction(s): Other (See Comments) Passed out, hospital      Consultations:   cardiology  Other Procedures/Studies:  No results found.   TODAY-DAY OF DISCHARGE:  Subjective:   Shelly Obrien today has no headache,no chest abdominal pain,no new weakness tingling or numbness, feels much better wants to go home today.   Objective:   Blood pressure 130/86, pulse 71, temperature 98.2 F (36.8 C), temperature source Oral, resp. rate 18, height 5\' 5"  (1.651 m), weight 69.8 kg (153 lb 12.8 oz), SpO2 98 %.  Intake/Output Summary (Last 24 hours) at 02/27/16 1313 Last data filed at 02/27/16 1301  Gross per 24 hour  Intake              723 ml  Output                0 ml  Net              723 ml   Filed Weights   02/25/16 0450 02/26/16 0550 02/27/16 0518  Weight: 70.5 kg (155 lb 6.4 oz) 69.8 kg (153 lb 12.8 oz) 69.8 kg (153 lb 12.8 oz)    Exam: Awake Alert, Oriented *3, No new F.N deficits, Normal affect [place].AT,PERRAL Supple Neck,No JVD, No cervical lymphadenopathy appriciated.  Symmetrical Chest wall movement, Good air movement bilaterally, CTAB RRR,No Gallops,Rubs or new Murmurs, No Parasternal Heave +ve B.Sounds, Abd Soft, Non tender, No organomegaly appriciated, No rebound -guarding or rigidity. No Cyanosis, Clubbing or edema, No new Rash or bruise   PERTINENT RADIOLOGIC STUDIES: No results found.   PERTINENT LAB RESULTS: CBC:  Recent Labs  02/26/16 0447 02/27/16 0519  WBC 7.9 7.6  HGB 14.2 14.3  HCT 45.2 45.6  PLT 152 149*   CMET CMP     Component Value Date/Time   NA 142 02/27/2016 1035   K 4.2 02/27/2016 1035   CL 111 02/27/2016 1035   CO2 24 02/27/2016 1035   GLUCOSE 129 (H) 02/27/2016 1035   BUN  27 (H) 02/27/2016 1035   CREATININE 1.19 (H) 02/27/2016 1035   CALCIUM 9.8 02/27/2016 1035   PROT 7.3 01/24/2016 1300   ALBUMIN 4.1 01/24/2016 1300   AST 20 01/24/2016 1300   ALT 16 01/24/2016 1300   ALKPHOS 110 01/24/2016 1300   BILITOT 1.2 01/24/2016 1300   GFRNONAA 39 (L) 02/27/2016 1035   GFRAA 45 (L) 02/27/2016 1035    GFR Estimated Creatinine Clearance: 30.2 mL/min (by C-G formula based on SCr of 1.19 mg/dL (H)). No results for input(s):  LIPASE, AMYLASE in the last 72 hours.  Recent Labs  02/24/16 1429 02/24/16 2016 02/25/16 0400  TROPONINI <0.03 <0.03 <0.03   Invalid input(s): POCBNP No results for input(s): DDIMER in the last 72 hours. No results for input(s): HGBA1C in the last 72 hours. No results for input(s): CHOL, HDL, LDLCALC, TRIG, CHOLHDL, LDLDIRECT in the last 72 hours.  Recent Labs  02/24/16 1429  TSH 0.580   No results for input(s): VITAMINB12, FOLATE, FERRITIN, TIBC, IRON, RETICCTPCT in the last 72 hours. Coags: No results for input(s): INR in the last 72 hours.  Invalid input(s): PT Microbiology: Recent Results (from the past 240 hour(s))  MRSA PCR Screening     Status: None   Collection Time: 02/24/16  5:34 PM  Result Value Ref Range Status   MRSA by PCR NEGATIVE NEGATIVE Final    Comment:        The GeneXpert MRSA Assay (FDA approved for NASAL specimens only), is one component of a comprehensive MRSA colonization surveillance program. It is not intended to diagnose MRSA infection nor to guide or monitor treatment for MRSA infections.     FURTHER DISCHARGE INSTRUCTIONS:  Get Medicines reviewed and adjusted: Please take all your medications with you for your next visit with your Primary MD  Laboratory/radiological data: Please request your Primary MD to go over all hospital tests and procedure/radiological results at the follow up, please ask your Primary MD to get all Hospital records sent to his/her office.  In some cases, they  will be blood work, cultures and biopsy results pending at the time of your discharge. Please request that your primary care M.D. goes through all the records of your hospital data and follows up on these results.  Also Note the following: If you experience worsening of your admission symptoms, develop shortness of breath, life threatening emergency, suicidal or homicidal thoughts you must seek medical attention immediately by calling 911 or calling your MD immediately  if symptoms less severe.  You must read complete instructions/literature along with all the possible adverse reactions/side effects for all the Medicines you take and that have been prescribed to you. Take any new Medicines after you have completely understood and accpet all the possible adverse reactions/side effects.   Do not drive when taking Pain medications or sleeping medications (Benzodaizepines)  Do not take more than prescribed Pain, Sleep and Anxiety Medications. It is not advisable to combine anxiety,sleep and pain medications without talking with your primary care practitioner  Special Instructions: If you have smoked or chewed Tobacco  in the last 2 yrs please stop smoking, stop any regular Alcohol  and or any Recreational drug use.  Wear Seat belts while driving.  Please note: You were cared for by a hospitalist during your hospital stay. Once you are discharged, your primary care physician will handle any further medical issues. Please note that NO REFILLS for any discharge medications will be authorized once you are discharged, as it is imperative that you return to your primary care physician (or establish a relationship with a primary care physician if you do not have one) for your post hospital discharge needs so that they can reassess your need for medications and monitor your lab values.  Total Time spent coordinating discharge including counseling, education and face to face time equals  45  minutes.  SignedOren Binet 02/27/2016 1:13 PM

## 2016-02-27 NOTE — Care Management Note (Signed)
Case Management Note  Patient Details  Name: Shelly Obrien MRN: [id number] Date of Birth: 03-Aug-1922  Subjective/Objective:  Pt presented for hypertension. Pt is from [place] [employer] [place].  [facility] to provide transportation home. No HH Services needed at this time.                  Action/Plan: Referral for CM for Eliquis. 30 day free card provided to patient. CM did call [facility] and medication is available. Pt is aware of co pay. Medication will need prior authorization. Pt will need Rx for 30 day free no refills and then the original Rx with refills.   S/W CHASTITY  @ [facility] RX # [phone]   1. ELIQUIS 2.5 MG BID ( 30 )  60 TAB  COVER- YES  CO-PAY- $ 35.00 Q/L 60 / 30  TIER- 3 DRUG S/W CHASTITY  @ [facility] RX # [phone]     Expected Discharge Date:                  Expected Discharge Plan:  Home/Self Care  In-House Referral:  NA  Discharge planning Services  CM Consult  Post Acute Care Choice:  NA Choice offered to:  NA  DME Arranged:  N/A DME Agency:  NA  HH Arranged:  NA HH Agency:  NA  Status of Service:  Completed, signed off  If discussed at [place] of Stay Meetings, dates discussed:    Additional Comments:  Bethena Roys, RN 02/27/2016, 2:27 PM

## 2016-02-28 ENCOUNTER — Ambulatory Visit (HOSPITAL_COMMUNITY): Admit: 2016-02-28 | Payer: Medicare Other | Admitting: Cardiology

## 2016-02-28 SURGERY — ECHOCARDIOGRAM, TRANSESOPHAGEAL
Anesthesia: Monitor Anesthesia Care

## 2016-03-02 ENCOUNTER — Ambulatory Visit (INDEPENDENT_AMBULATORY_CARE_PROVIDER_SITE_OTHER): Payer: Medicare Other | Admitting: Internal Medicine

## 2016-03-02 ENCOUNTER — Encounter: Payer: Self-pay | Admitting: Internal Medicine

## 2016-03-02 VITALS — BP 150/88 | HR 60 | Ht 65.0 in | Wt 159.0 lb

## 2016-03-02 DIAGNOSIS — I480 Paroxysmal atrial fibrillation: Secondary | ICD-10-CM

## 2016-03-02 DIAGNOSIS — I10 Essential (primary) hypertension: Secondary | ICD-10-CM | POA: Diagnosis not present

## 2016-03-02 DIAGNOSIS — R42 Dizziness and giddiness: Secondary | ICD-10-CM

## 2016-03-02 NOTE — Patient Instructions (Addendum)
Your physician recommends that you continue on your current medications as directed. Please refer to the Current Medication list given to you today. Your physician recommends that you schedule a follow-up appointment in: December, 2017 with Dr. [name].

## 2016-03-02 NOTE — Progress Notes (Signed)
Cardiology Office Note   Date:  03/02/2016   ID:  Shelly, Obrien Feb 23, 1923, MRN [id number]  PCP:  Irven Shelling, MD  Cardiologist:   Dorris Carnes, MD   F/U of CAD     History of Present Illness: Shelly Obrien is a 82 y.o. female with a history of CAD s/p remote PCI to LAD and HTN . Also history of dizziness Followed by ENT Has pill to take if develops problem I saw her in Sept 2016   She was admitted earlier this month, just discharged on the 25th  She was in atrial fib with RVR  Converted to SR on own.  Placed on Eliquis.  Echo on 9/24 LVEF 55 to 60^  Akinesis of bas/mid anteroseptal and inferoseptal wall Since d/c she denies signif palpitations.     Breathing is OK      Outpatient Medications Prior to Visit  Medication Sig Dispense Refill  . apixaban (ELIQUIS) 2.5 MG TABS tablet Take 1 tablet (2.5 mg total) by mouth 2 (two) times daily. 60 tablet 0  . cyanocobalamin (V-R VITAMIN B-12) 500 MCG tablet Take 1,000 mcg by mouth every Monday, Wednesday, and Friday.     . Fluticasone-Salmeterol (ADVAIR DISKUS) 100-50 MCG/DOSE AEPB Inhale 1 puff into the lungs 2 (two) times daily as needed. (COLD SYMPTOMS)    . metoprolol tartrate (LOPRESSOR) 25 MG tablet Take 1 tablet (25 mg total) by mouth 2 (two) times daily. 60 tablet 0  . Multiple Vitamins-Minerals (ICAPS) CAPS Take 1 capsule by mouth every morning.     . nitroGLYCERIN (NITROSTAT) 0.4 MG SL tablet Place 0.4 mg under the tongue every 5 (five) minutes as needed for chest pain.     [name] ondansetron (ZOFRAN ODT) 8 MG disintegrating tablet Take 1 tablet (8 mg total) by mouth every 8 (eight) hours as needed for nausea or vomiting. 10 tablet 0  . polyvinyl alcohol (ARTIFICIAL TEARS) 1.4 % ophthalmic solution Place 1 drop into the right eye 2 (two) times daily.    . tamsulosin (FLOMAX) 0.4 MG CAPS capsule Take 1 capsule (0.4 mg total) by mouth daily. 7 capsule 0  . triamcinolone (NASACORT ALLERGY 24HR) 55 MCG/ACT AERO nasal inhaler  Place 2 sprays into the nose daily as needed (allergies).     No facility-administered medications prior to visit.      Allergies:   Antihistamines, chlorpheniramine-type; Penicillins; Contrast media [iodinated diagnostic agents]; Sulfonamide derivatives; and Pheniramine   Past Medical History:  Diagnosis Date  . Complication of anesthesia    hard to wake   . Coronary artery disease   . H/O: hysterectomy   . Hx of lumpectomy   . Hyperlipidemia   . Hypertension   . Kidney stones   . Myocardial infarct (HCC)    hx of  . PONV (postoperative nausea and vomiting)   . Wears glasses     Past Surgical History:  Procedure Laterality Date  . ABDOMINAL HYSTERECTOMY    . ANGIOPLASTY    . BROW LIFT Right 04/07/2014   Procedure: REPAIR OF ENTROPION AND TRICHIASIS OF RIGHT LOWER EYE LID ;  Surgeon: Theodoro Kos, DO;  Location: [facility];  Service: Plastics;  Laterality: Right;  . CARPAL TUNNEL RELEASE     left  . DILATION AND CURETTAGE OF UTERUS    . EYE SURGERY     both cataracts  . KNEE SURGERY     right     Social History:  The patient  reports that she has never smoked. She has never used smokeless tobacco. She reports that she does not drink alcohol or use drugs.   Family History:  The patient's family history is not on file.    ROS:  Please see the history of present illness. All other systems are reviewed and  Negative to the above problem except as noted.    PHYSICAL EXAM: VS:  BP (!) 150/88   Pulse 60   Ht 5\' 5"  (1.651 m)   Wt 159 lb (72.1 kg)   SpO2 98%   BMI 26.46 kg/m   GEN: Well nourished, well developed, in no acute distress  HEENT: normal  Neck: no JVD, carotid bruits, or masses Cardiac: RRR; no murmurs, rubs, or gallops,no edema  Respiratory:  clear to auscultation bilaterally, normal work of breathing GI: soft, nontender, nondistended, + BS  No hepatomegaly  MS: no deformity Moving all extremities   Skin: warm and dry, no rash Neuro:   Strength and sensation are intact Psych: euthymic mood, full affect   EKG:  EKG is not  ordered today.   Lipid Panel No results found for: CHOL, TRIG, HDL, CHOLHDL, VLDL, LDLCALC, LDLDIRECT    Wt Readings from Last 3 Encounters:  03/02/16 159 lb (72.1 kg)  02/27/16 153 lb 12.8 oz (69.8 kg)  01/24/16 160 lb (72.6 kg)      ASSESSMENT AND PLAN: 1  HTN   BP is higher   I would not change meds for now.  Follow up in 2 months    2  CAD  No symptoms to sugg angina    3  PAF  Keep on same meds     4.   HL  Lipids need to get checked on next visit    Current medicines are reviewed at length with the patient today.  The patient does not have concerns regarding medicines.   Signed, Dorris Carnes, MD  03/02/2016 4:09 PM    [facility] [place], [place], [place]  [address] Phone: [phone]; Fax: [phone]

## 2016-03-05 DIAGNOSIS — I480 Paroxysmal atrial fibrillation: Secondary | ICD-10-CM | POA: Diagnosis not present

## 2016-03-05 DIAGNOSIS — Z23 Encounter for immunization: Secondary | ICD-10-CM | POA: Diagnosis not present

## 2016-03-05 DIAGNOSIS — I10 Essential (primary) hypertension: Secondary | ICD-10-CM | POA: Diagnosis not present

## 2016-03-22 DIAGNOSIS — H353221 Exudative age-related macular degeneration, left eye, with active choroidal neovascularization: Secondary | ICD-10-CM | POA: Diagnosis not present

## 2016-04-03 DIAGNOSIS — N202 Calculus of kidney with calculus of ureter: Secondary | ICD-10-CM | POA: Diagnosis not present

## 2016-04-12 DIAGNOSIS — H353221 Exudative age-related macular degeneration, left eye, with active choroidal neovascularization: Secondary | ICD-10-CM | POA: Diagnosis not present

## 2016-04-17 DIAGNOSIS — I10 Essential (primary) hypertension: Secondary | ICD-10-CM | POA: Diagnosis not present

## 2016-04-17 DIAGNOSIS — K921 Melena: Secondary | ICD-10-CM | POA: Diagnosis not present

## 2016-05-13 NOTE — Progress Notes (Signed)
Cardiology Office Note   Date:  05/14/2016   ID:  QUANASIA KINNE, DOB November 22, 1922, MRN [id number]  PCP:  Irven Shelling, MD  Cardiologist:   Dorris Carnes, MD   F/U of CAD      History of Present Illness: Shelly Obrien is a 82 y.o. female with a history of CAD (remote PCI to LAD), HTN  Dizziness   I saw hier in clinic in Sept 2016   She was admitted in Sept 2017 with afib with RVR   Placed on Eliquis  Converted to SR  Echo showed  normal LVEF   She did not feel it that much    Dr Cordelia Pen saw pt  BP up  Rx amlodipine 2.5 mg  Pt says she feels funny when taking No energy    No heart racing  No hand trembling   Taking Eliquis 2x per day      Outpatient Medications Prior to Visit  Medication Sig Dispense Refill  . apixaban (ELIQUIS) 2.5 MG TABS tablet Take 1 tablet (2.5 mg total) by mouth 2 (two) times daily. 60 tablet 0  . Fluticasone-Salmeterol (ADVAIR DISKUS) 100-50 MCG/DOSE AEPB Inhale 1 puff into the lungs 2 (two) times daily as needed. (COLD SYMPTOMS)    . metoprolol tartrate (LOPRESSOR) 25 MG tablet Take 1 tablet (25 mg total) by mouth 2 (two) times daily. 60 tablet 0  . Multiple Vitamins-Minerals (ICAPS) CAPS Take 1 capsule by mouth every morning.     . triamcinolone (NASACORT ALLERGY 24HR) 55 MCG/ACT AERO nasal inhaler Place 2 sprays into the nose daily as needed (allergies).    . cyanocobalamin (V-R VITAMIN B-12) 500 MCG tablet Take 1,000 mcg by mouth every Monday, Wednesday, and Friday.     . nitroGLYCERIN (NITROSTAT) 0.4 MG SL tablet Place 0.4 mg under the tongue every 5 (five) minutes as needed for chest pain.     [name] ondansetron (ZOFRAN ODT) 8 MG disintegrating tablet Take 1 tablet (8 mg total) by mouth every 8 (eight) hours as needed for nausea or vomiting. (Patient not taking: Reported on 05/14/2016) 10 tablet 0  . polyvinyl alcohol (ARTIFICIAL TEARS) 1.4 % ophthalmic solution Place 1 drop into the right eye 2 (two) times daily.    . tamsulosin (FLOMAX) 0.4 MG CAPS  capsule Take 1 capsule (0.4 mg total) by mouth daily. (Patient not taking: Reported on 05/14/2016) 7 capsule 0   No facility-administered medications prior to visit.      Allergies:   Antihistamines, chlorpheniramine-type; Penicillins; Contrast media [iodinated diagnostic agents]; Sulfonamide derivatives; Atorvastatin; and Pheniramine   Past Medical History:  Diagnosis Date  . Complication of anesthesia    hard to wake   . Coronary artery disease   . H/O: hysterectomy   . Hx of lumpectomy   . Hyperlipidemia   . Hypertension   . Kidney stones   . Myocardial infarct    hx of  . PONV (postoperative nausea and vomiting)   . Wears glasses     Past Surgical History:  Procedure Laterality Date  . ABDOMINAL HYSTERECTOMY    . ANGIOPLASTY    . BROW LIFT Right 04/07/2014   Procedure: REPAIR OF ENTROPION AND TRICHIASIS OF RIGHT LOWER EYE LID ;  Surgeon: Theodoro Kos, DO;  Location: [facility];  Service: Plastics;  Laterality: Right;  . CARPAL TUNNEL RELEASE     left  . DILATION AND CURETTAGE OF UTERUS    . EYE SURGERY     both  cataracts  . KNEE SURGERY     right     Social History:  The patient  reports that she has never smoked. She has never used smokeless tobacco. She reports that she does not drink alcohol or use drugs.   Family History:  The patient's family history is not on file.    ROS:  Please see the history of present illness. All other systems are reviewed and  Negative to the above problem except as noted.    PHYSICAL EXAM: VS:  BP (!) 158/90   Pulse 64   Ht 5\' 5"  (1.651 m)   Wt 158 lb 3.2 oz (71.8 kg)   SpO2 95%   BMI 26.33 kg/m   GEN: Well nourished, well developed, in no acute distress  HEENT: normal  Neck: no JVD, carotid bruits, or masses Cardiac: RRR; no murmurs, rubs, or gallops,no edema  Respiratory:  clear to auscultation bilaterally, normal work of breathing GI: soft, nontender, nondistended, + BS  No hepatomegaly  MS: no  deformity Moving all extremities   Skin: warm and dry, no rash Neuro:  Strength and sensation are intact Psych: euthymic mood, full affect   EKG:  EKG is not ordered today.   Lipid Panel No results found for: CHOL, TRIG, HDL, CHOLHDL, VLDL, LDLCALC, LDLDIRECT    Wt Readings from Last 3 Encounters:  05/14/16 158 lb 3.2 oz (71.8 kg)  03/02/16 159 lb (72.1 kg)  02/27/16 153 lb 12.8 oz (69.8 kg)      ASSESSMENT AND PLAN:  1 PAF  APpear to be in SR  Keep on eliquis bid  Check BMET and CBC   2.  HTN  BP is up a little  Told her to try 1.25 amlodipine  Call if feels  bad   F/u in May / June    Current medicines are reviewed at length with the patient today.  The patient does not have concerns regarding medicines.  Signed, Dorris Carnes, MD  05/14/2016 9:29 AM    [address], [place], [place]  [address] Phone: [phone]; Fax: [phone]

## 2016-05-14 ENCOUNTER — Encounter: Payer: Self-pay | Admitting: Internal Medicine

## 2016-05-14 ENCOUNTER — Ambulatory Visit (INDEPENDENT_AMBULATORY_CARE_PROVIDER_SITE_OTHER): Payer: Medicare Other | Admitting: Internal Medicine

## 2016-05-14 VITALS — BP 158/90 | HR 64 | Ht 65.0 in | Wt 158.2 lb

## 2016-05-14 DIAGNOSIS — I2510 Atherosclerotic heart disease of native coronary artery without angina pectoris: Secondary | ICD-10-CM

## 2016-05-14 LAB — CBC
HCT: 43.6 % (ref 35.0–45.0)
Hemoglobin: 14.2 g/dL (ref 11.7–15.5)
MCH: 30.8 pg (ref 27.0–33.0)
MCHC: 32.6 g/dL (ref 32.0–36.0)
MCV: 94.6 fL (ref 80.0–100.0)
MPV: 10.1 fL (ref 7.5–12.5)
Platelets: 212 10*3/uL (ref 140–400)
RBC: 4.61 MIL/uL (ref 3.80–5.10)
RDW: 14.3 % (ref 11.0–15.0)
WBC: 6.9 10*3/uL (ref 3.8–10.8)

## 2016-05-14 LAB — BASIC METABOLIC PANEL
BUN: 17 mg/dL (ref 7–25)
CO2: 27 mmol/L (ref 20–31)
Calcium: 9.7 mg/dL (ref 8.6–10.4)
Chloride: 105 mmol/L (ref 98–110)
Creat: 1.01 mg/dL — ABNORMAL HIGH (ref 0.60–0.88)
Glucose, Bld: 109 mg/dL — ABNORMAL HIGH (ref 65–99)
Potassium: 4.2 mmol/L (ref 3.5–5.3)
Sodium: 141 mmol/L (ref 135–146)

## 2016-05-14 MED ORDER — AMLODIPINE BESYLATE 2.5 MG PO TABS: 1.2500 mg | ORAL_TABLET | Freq: Every day | ORAL | 3 refills | Status: DC

## 2016-05-14 NOTE — Patient Instructions (Signed)
Your physician has recommended you make the following change in your medication:  1.) restart your amlodipine (Norvasc) 2.5 mg ---take 1/2 tablet daily STOP AND CALL DR ROSS IF IT MAKES YOU FEEL FUNNY LIKE THE WHOLE PILL DID  Your physician recommends that you return for lab work in: [place] (bmet, cbc)  Your physician wants you to follow-up in: 5 months (May, 2018) with Dr. [name].  You will receive a reminder letter in the mail two months in advance. If you don't receive a letter, please call our office to schedule the follow-up appointment.

## 2016-05-17 DIAGNOSIS — Z9849 Cataract extraction status, unspecified eye: Secondary | ICD-10-CM | POA: Diagnosis not present

## 2016-05-17 DIAGNOSIS — H353221 Exudative age-related macular degeneration, left eye, with active choroidal neovascularization: Secondary | ICD-10-CM | POA: Diagnosis not present

## 2016-05-17 DIAGNOSIS — Z961 Presence of intraocular lens: Secondary | ICD-10-CM | POA: Diagnosis not present

## 2016-05-17 DIAGNOSIS — H02102 Unspecified ectropion of right lower eyelid: Secondary | ICD-10-CM | POA: Diagnosis not present

## 2016-05-17 DIAGNOSIS — H26492 Other secondary cataract, left eye: Secondary | ICD-10-CM | POA: Diagnosis not present

## 2016-06-12 DIAGNOSIS — M5412 Radiculopathy, cervical region: Secondary | ICD-10-CM | POA: Diagnosis not present

## 2016-06-12 DIAGNOSIS — I10 Essential (primary) hypertension: Secondary | ICD-10-CM | POA: Diagnosis not present

## 2016-06-26 DIAGNOSIS — H353231 Exudative age-related macular degeneration, bilateral, with active choroidal neovascularization: Secondary | ICD-10-CM | POA: Diagnosis not present

## 2016-07-17 DIAGNOSIS — J069 Acute upper respiratory infection, unspecified: Secondary | ICD-10-CM | POA: Diagnosis not present

## 2016-08-02 DIAGNOSIS — H353231 Exudative age-related macular degeneration, bilateral, with active choroidal neovascularization: Secondary | ICD-10-CM | POA: Diagnosis not present

## 2016-08-20 ENCOUNTER — Telehealth: Payer: Self-pay | Admitting: Internal Medicine

## 2016-08-20 NOTE — Telephone Encounter (Signed)
New message    Pt c/o medication issue:  1. Name of Medication: apixaban (ELIQUIS) 2.5 MG TABS tablet  2. How are you currently taking this medication (dosage and times per day)? 2.5mg   3. Are you having a reaction (difficulty breathing--STAT)? incontinence  4. What is your medication issue? Pt states that she is having incontinence issues due to this medicine and she read online that if pt has incontinence that they need to contact their dr right away so she is letting someone know. Wants to be cut down on the medication to stop this.

## 2016-08-20 NOTE — Telephone Encounter (Signed)
No answer at phone number listed. 

## 2016-08-21 NOTE — Telephone Encounter (Signed)
Called patient.  She is worried that her Eliquis is causing her diarrhea/incontinence that she's had for about the past month.   Her stool is usual color.  She has usually 2 episodes per day, which is her usual number of stools when they are normal.   She made an appointment with Dr. Laurann Montana for early April.   Pt took antibiotics about a month ago as well for a sinus infection.   I advised it is not likely that her symptoms are from the Eliquis, and asked her to call PCP and tell them her symptoms and she may get a sooner appointment.  She verbalizes understanding and appreciation for the call back.  Reminded patient that any bright red blood or black tarry stools she needs to call us ASAP.

## 2016-08-22 ENCOUNTER — Encounter (HOSPITAL_COMMUNITY): Payer: Self-pay | Admitting: Emergency Medicine

## 2016-08-22 ENCOUNTER — Emergency Department (HOSPITAL_COMMUNITY): Payer: Medicare Other

## 2016-08-22 ENCOUNTER — Emergency Department (HOSPITAL_COMMUNITY)
Admission: EM | Admit: 2016-08-22 | Discharge: 2016-08-22 | Disposition: A | Discharge status: Home / Self Care | Payer: Medicare Other | Attending: Emergency Medicine | Admitting: Emergency Medicine

## 2016-08-22 DIAGNOSIS — N183 Chronic kidney disease, stage 3 (moderate): Secondary | ICD-10-CM | POA: Insufficient documentation

## 2016-08-22 DIAGNOSIS — I129 Hypertensive chronic kidney disease with stage 1 through stage 4 chronic kidney disease, or unspecified chronic kidney disease: Secondary | ICD-10-CM | POA: Insufficient documentation

## 2016-08-22 DIAGNOSIS — R002 Palpitations: Secondary | ICD-10-CM | POA: Diagnosis not present

## 2016-08-22 DIAGNOSIS — I2510 Atherosclerotic heart disease of native coronary artery without angina pectoris: Secondary | ICD-10-CM | POA: Insufficient documentation

## 2016-08-22 DIAGNOSIS — Z7901 Long term (current) use of anticoagulants: Secondary | ICD-10-CM | POA: Insufficient documentation

## 2016-08-22 DIAGNOSIS — I10 Essential (primary) hypertension: Secondary | ICD-10-CM | POA: Diagnosis not present

## 2016-08-22 DIAGNOSIS — I252 Old myocardial infarction: Secondary | ICD-10-CM | POA: Insufficient documentation

## 2016-08-22 LAB — BASIC METABOLIC PANEL
Anion gap: 8 (ref 5–15)
BUN: 14 mg/dL (ref 6–20)
CO2: 24 mmol/L (ref 22–32)
Calcium: 10 mg/dL (ref 8.9–10.3)
Chloride: 108 mmol/L (ref 101–111)
Creatinine, Ser: 0.74 mg/dL (ref 0.44–1.00)
GFR calc Af Amer: >60 mL/min (ref >60)
GFR calc non Af Amer: >60 mL/min (ref >60)
Glucose, Bld: 152 mg/dL — ABNORMAL HIGH (ref 65–99)
Potassium: 4.2 mmol/L (ref 3.5–5.1)
Sodium: 140 mmol/L (ref 135–145)

## 2016-08-22 LAB — CBC
HCT: 45.2 % (ref 36.0–46.0)
Hemoglobin: 14.5 g/dL (ref 12.0–15.0)
MCH: 30.8 pg (ref 26.0–34.0)
MCHC: 32.1 g/dL (ref 30.0–36.0)
MCV: 96 fL (ref 78.0–100.0)
Platelets: 172 10*3/uL (ref 150–400)
RBC: 4.71 MIL/uL (ref 3.87–5.11)
RDW: 14.4 % (ref 11.5–15.5)
WBC: 6.5 10*3/uL (ref 4.0–10.5)

## 2016-08-22 LAB — TROPONIN I: Troponin I: <0.03 ng/mL (ref <0.03)

## 2016-08-22 LAB — MAGNESIUM: Magnesium: 2.1 mg/dL (ref 1.7–2.4)

## 2016-08-22 NOTE — ED Notes (Signed)
Patient transported to X-ray 

## 2016-08-22 NOTE — ED Triage Notes (Signed)
Patient from home, having palpitations at home.  No shortness of breath, no chest pain, only having nausea.  She has AFib on monitor per EMS.  Patient having PVCs and PACs on monitor.  She did not take anything for her palpitations.  She is CAOx4, she is hypertensive.

## 2016-08-22 NOTE — ED Provider Notes (Signed)
[facility] DEPT Provider Note   CSN: [id number] Arrival date & time: 08/22/16  0122  By signing my name below, I, Higinio Plan, attest that this documentation has been prepared under the direction and in the presence of Everlene Balls, MD . Electronically Signed: Higinio Plan, Scribe. 08/22/2016. 2:25 AM.  History   Chief Complaint Chief Complaint  Patient presents with  . Palpitations   The history is provided by the patient. No language interpreter was used.   HPI Comments: Shelly Obrien is a 83 y.o. female with PMHx of A-Fib on Eliquis, HTN, and MI, who presents to the Emergency Department s/p a resolved episode of palpitations and "shaking" that began PTA. Pt reports she checked her blood pressure during this episode which was "way high." She states associated nausea and diarrhea. She notes she was recently diagnosed with sinusitis in which she was prescribed antibiotics that she has now completed with moderate relief. Pt denies any chest pain and decreased appetite.   Past Medical History:  Diagnosis Date  . Complication of anesthesia    hard to wake   . Coronary artery disease   . H/O: hysterectomy   . Hx of lumpectomy   . Hyperlipidemia   . Hypertension   . Kidney stones   . Myocardial infarct    hx of  . PONV (postoperative nausea and vomiting)   . Wears glasses     Patient Active Problem List   Diagnosis Date Noted  . Atrial fibrillation with RVR ([facility]) 02/24/2016  . CKD (chronic kidney disease) stage 3, GFR 30-59 ml/min 02/24/2016  . Atrial fibrillation ([facility]) 02/24/2016  . Exudative macular degeneration ([facility]) 02/24/2016  . CHOLELITHIASIS 02/14/2009  . HYPERLIPIDEMIA 12/29/2008  . Essential hypertension 12/29/2008  . MYOCARDIAL INFARCTION, HX OF 12/29/2008  . CAD (coronary artery disease) 12/29/2008    Past Surgical History:  Procedure Laterality Date  . ABDOMINAL HYSTERECTOMY    . ANGIOPLASTY    . BROW LIFT Right 04/07/2014   Procedure: REPAIR OF ENTROPION AND  TRICHIASIS OF RIGHT LOWER EYE LID ;  Surgeon: Theodoro Kos, DO;  Location: [facility];  Service: Plastics;  Laterality: Right;  . CARPAL TUNNEL RELEASE     left  . DILATION AND CURETTAGE OF UTERUS    . EYE SURGERY     both cataracts  . KNEE SURGERY     right    OB History    No data available     Home Medications    Prior to Admission medications   Medication Sig Start Date End Date Taking? Authorizing Provider  amLODipine (NORVASC) 2.5 MG tablet Take 0.5 tablets (1.25 mg total) by mouth daily. 05/14/16 08/12/16  Fay Records, MD  apixaban (ELIQUIS) 2.5 MG TABS tablet Take 1 tablet (2.5 mg total) by mouth 2 (two) times daily. 02/27/16   Shanker Kristeen Mans, MD  cyanocobalamin (V-R VITAMIN B-12) 500 MCG tablet Take 1,000 mcg by mouth every Monday, Wednesday, and Friday.     Historical Provider, MD  Fluticasone-Salmeterol (ADVAIR DISKUS) 100-50 MCG/DOSE AEPB Inhale 1 puff into the lungs 2 (two) times daily as needed. (COLD SYMPTOMS) 06/02/13   Historical Provider, MD  metoprolol tartrate (LOPRESSOR) 25 MG tablet Take 1 tablet (25 mg total) by mouth 2 (two) times daily. 02/27/16   Shanker Kristeen Mans, MD  Multiple Vitamins-Minerals (ICAPS) CAPS Take 1 capsule by mouth every morning.     Historical Provider, MD  triamcinolone (NASACORT ALLERGY 24HR) 55 MCG/ACT AERO nasal inhaler Place  2 sprays into the nose daily as needed (allergies).    Historical Provider, MD    Family History Family History  Problem Relation Age of Onset  . Coronary artery disease      positive family hx of  . Cancer      family hx of    Social History Social History  Substance Use Topics  . Smoking status: Never Smoker  . Smokeless tobacco: Never Used  . Alcohol use No   Allergies   Antihistamines, chlorpheniramine-type; Penicillins; Contrast media [iodinated diagnostic agents]; Sulfonamide derivatives; Atorvastatin; and Pheniramine  Review of Systems Review of Systems  Constitutional:  Negative for appetite change.  Cardiovascular: Positive for palpitations. Negative for chest pain.  Gastrointestinal: Positive for diarrhea and nausea.  All other systems reviewed and are negative.  Physical Exam Updated Vital Signs BP (!) 155/98   Pulse 88   Resp 20   SpO2 97%   Physical Exam  Constitutional: She is oriented to person, place, and time. She appears well-developed and well-nourished. No distress.  HENT:  Head: Normocephalic and atraumatic.  Nose: Nose normal.  Mouth/Throat: Oropharynx is clear and moist. No oropharyngeal exudate.  Neck: Normal range of motion. Neck supple. No JVD present. No tracheal deviation present. No thyromegaly present.  Cardiovascular: Normal rate, regular rhythm and normal heart sounds.  Exam reveals no gallop and no friction rub.   No murmur heard. Pulmonary/Chest: Effort normal and breath sounds normal. No respiratory distress. She has no wheezes. She exhibits no tenderness.  Abdominal: Soft. Bowel sounds are normal. She exhibits no distension and no mass. There is no tenderness. There is no rebound and no guarding.  Musculoskeletal: Normal range of motion. She exhibits no edema or tenderness.  Lymphadenopathy:    She has no cervical adenopathy.  Neurological: She is alert and oriented to person, place, and time. No cranial nerve deficit. She exhibits normal muscle tone.  Skin: Skin is warm and dry. No rash noted. No erythema. No pallor.  Nursing note and vitals reviewed.  ED Treatments / Results  DIAGNOSTIC STUDIES:  Oxygen Saturation is 97% on RA, normal by my interpretation.    COORDINATION OF CARE:  2:22 AM Discussed treatment plan with pt at bedside and pt agreed to plan.  Labs (all labs ordered are listed, but only abnormal results are displayed) Labs Reviewed  BASIC METABOLIC PANEL - Abnormal; Notable for the following:       Result Value   Glucose, Bld 152 (*)    All other components within normal limits  CBC  TROPONIN  I  MAGNESIUM    EKG  EKG Interpretation  Date/Time:  Wednesday August 22 2016 01:42:06 EDT Ventricular Rate:  85 PR Interval:  184 QRS Duration: 98 QT Interval:  376 QTC Calculation: 447 R Axis:   -71 Text Interpretation:  Sinus rhythm with Possible Premature atrial complexes with Abberant conduction Left axis deviation Pulmonary disease pattern Abnormal ECG no longer in afib with RVR Confirmed by Glynn Octave [phone]) on 08/22/2016 1:47:29 AM       Radiology Dg Chest 2 View  Result Date: 08/22/2016 CLINICAL DATA:  83 year old female with high blood pressure. No chest complaints. EXAM: CHEST  2 VIEW COMPARISON:  Chest radiograph dated 01/15/2016 FINDINGS: The lungs are clear. There is no pleural effusion or pneumothorax. The cardiac silhouette is within normal limits. The aorta is tortuous. There is osteopenia with degenerative changes of the spine and shoulders. No acute osseous pathology. IMPRESSION: No active cardiopulmonary  disease. Electronically Signed   By: Anner Crete M.D.   On: 08/22/2016 03:37    Procedures Procedures (including critical care time)  Medications Ordered in ED Medications - No data to display  Initial Impression / Assessment and Plan / ED Course  I have reviewed the triage vital signs and the nursing notes.  Pertinent labs & imaging results that were available during my care of the patient were reviewed by me and considered in my medical decision making (see chart for details).    Patient presents to the ED for palpitations.  She was likely initially in a fib but has not been since my evaluation in the ED.  Electrolytes are normal, as is her CXR and EKG.  She just finished a round of abx (causing diarrhea) for sinusitis, so this could have tipped her over into a fib. Will continue to monitor in the ED.   She continues to appear well and in NAD.  Vs remain wtihin her normal limits.  She is safe for DC with fu within 3 days.     I  personally performed the services described in this documentation, which was scribed in my presence. The recorded information has been reviewed and is accurate.     Final Clinical Impressions(s) / ED Diagnoses   Final diagnoses:  Palpitations    New Prescriptions New Prescriptions   No medications on file     Everlene Balls, MD 08/22/16 [phone]

## 2016-08-23 DIAGNOSIS — H353231 Exudative age-related macular degeneration, bilateral, with active choroidal neovascularization: Secondary | ICD-10-CM | POA: Diagnosis not present

## 2016-08-23 DIAGNOSIS — R000 Tachycardia, unspecified: Secondary | ICD-10-CM | POA: Diagnosis not present

## 2016-09-03 DIAGNOSIS — Z0000 Encounter for general adult medical examination without abnormal findings: Secondary | ICD-10-CM | POA: Diagnosis not present

## 2016-09-03 DIAGNOSIS — I480 Paroxysmal atrial fibrillation: Secondary | ICD-10-CM | POA: Diagnosis not present

## 2016-09-03 DIAGNOSIS — Z1389 Encounter for screening for other disorder: Secondary | ICD-10-CM | POA: Diagnosis not present

## 2016-09-03 DIAGNOSIS — I10 Essential (primary) hypertension: Secondary | ICD-10-CM | POA: Diagnosis not present

## 2016-09-27 DIAGNOSIS — H353221 Exudative age-related macular degeneration, left eye, with active choroidal neovascularization: Secondary | ICD-10-CM | POA: Diagnosis not present

## 2016-09-27 DIAGNOSIS — H353211 Exudative age-related macular degeneration, right eye, with active choroidal neovascularization: Secondary | ICD-10-CM | POA: Diagnosis not present

## 2016-10-10 NOTE — Progress Notes (Signed)
Cardiology Office Note   Date:  10/12/2016   ID:  MERRELL RETTINGER, DOB 1922/09/07, MRN [id number]  PCP:  [name], MD  Cardiologist:   Dorris Carnes, MD   F/U of CAD and PAF  History of Present Illness: Shelly Obrien is a 83 y.o. female with a history of CAD (remote PCI to LAD) HTN, dizziness  I saw her in Dec 2017  She was admitted in Sept 2017 with afib with RVR   Echo noraml LVEF   COnverted to SR    Was in ED on 3/21  Palpitations    Shaking.  BP was high    She curently denes CP  Says she feels bad in general  Says her problems began when she started taking Eliquis    Has some incontinence of bowels  Blames Eliquis       Current Meds  Medication Sig  . amLODipine (NORVASC) 2.5 MG tablet Take 0.5 tablets (1.25 mg total) by mouth daily.  [name] apixaban (ELIQUIS) 2.5 MG TABS tablet Take 1 tablet (2.5 mg total) by mouth 2 (two) times daily.  . metoprolol tartrate (LOPRESSOR) 25 MG tablet Take 1 tablet (25 mg total) by mouth 2 (two) times daily.  . Multiple Vitamins-Minerals (ICAPS) CAPS Take 1 capsule by mouth every morning.   . triamcinolone (NASACORT ALLERGY 24HR) 55 MCG/ACT AERO nasal inhaler Place 2 sprays into the nose daily as needed (allergies).     Allergies:   Antihistamines, chlorpheniramine-type; Penicillins; Contrast media [iodinated diagnostic agents]; Sulfonamide derivatives; Atorvastatin; and Pheniramine   Past Medical History:  Diagnosis Date  . Complication of anesthesia    hard to wake   . Coronary artery disease   . H/O: hysterectomy   . Hx of lumpectomy   . Hyperlipidemia   . Hypertension   . Kidney stones   . Myocardial infarct ([facility])    hx of  . PONV (postoperative nausea and vomiting)   . Wears glasses     Past Surgical History:  Procedure Laterality Date  . ABDOMINAL HYSTERECTOMY    . ANGIOPLASTY    . BROW LIFT Right 04/07/2014   Procedure: REPAIR OF ENTROPION AND TRICHIASIS OF RIGHT LOWER EYE LID ;  Surgeon: Theodoro Kos, DO;  Location:  [facility];  Service: Plastics;  Laterality: Right;  . CARPAL TUNNEL RELEASE     left  . DILATION AND CURETTAGE OF UTERUS    . EYE SURGERY     both cataracts  . KNEE SURGERY     right     Social History:  The patient  reports that she has never smoked. She has never used smokeless tobacco. She reports that she does not drink alcohol or use drugs.   Family History:  The patient's family history is not on file.    ROS:  Please see the history of present illness. All other systems are reviewed and  Negative to the above problem except as noted.    PHYSICAL EXAM: VS:  BP (!) 142/82 (BP Location: Right Arm, Patient Position: Sitting, Cuff Size: Normal)   Pulse 64   Ht 5\' 5"  (1.651 m)   Wt 165 lb 9.6 oz (75.1 kg)   SpO2 97%   BMI 27.56 kg/m   GEN: Well nourished, well developed, in no acute distress  HEENT: normal  Neck: no JVD, carotid bruits, or masses Cardiac: RRR; no murmurs, rubs, or gallops,no edema  Respiratory:  clear to auscultation bilaterally, normal work of breathing  GI: soft, nontender, nondistended, + BS  No hepatomegaly  MS: no deformity Moving all extremities   Skin: warm and dry, no rash Neuro:  Strength and sensation are intact Psych: euthymic mood, full affect   EKG:  EKG is not ordered today.   Lipid Panel No results found for: CHOL, TRIG, HDL, CHOLHDL, VLDL, LDLCALC, LDLDIRECT    Wt Readings from Last 3 Encounters:  10/12/16 165 lb 9.6 oz (75.1 kg)  05/14/16 158 lb 3.2 oz (71.8 kg)  03/02/16 159 lb (72.1 kg)      ASSESSMENT AND PLAN:  1  PAF  I am not convinced that she is having signif spells  I spent time discussing meds, anticoagulation, risk of stroke  I do not think Eliquis is to blame   She is willing to try Xarelto to see if works   WIll Freight forwarder for dosing   Note-ON review, pt did not appear to be on adequate dosing for Eliquis 2  CAD NO symptoms to sugg active angina  3  HTN    BP is fair   Current medicines are  reviewed at length with the patient today.  The patient does not have concerns regarding medicines.  Signed, Dorris Carnes, MD  10/12/2016 10:20 AM    [facility] [address], [place], [place]  [address] Phone: [phone]; Fax: [phone]

## 2016-10-12 ENCOUNTER — Ambulatory Visit (INDEPENDENT_AMBULATORY_CARE_PROVIDER_SITE_OTHER): Payer: Medicare Other | Admitting: Internal Medicine

## 2016-10-12 ENCOUNTER — Encounter (INDEPENDENT_AMBULATORY_CARE_PROVIDER_SITE_OTHER): Payer: Self-pay

## 2016-10-12 ENCOUNTER — Encounter: Payer: Self-pay | Admitting: Internal Medicine

## 2016-10-12 VITALS — BP 142/82 | HR 64 | Ht 65.0 in | Wt 165.6 lb

## 2016-10-12 DIAGNOSIS — I10 Essential (primary) hypertension: Secondary | ICD-10-CM

## 2016-10-12 DIAGNOSIS — I480 Paroxysmal atrial fibrillation: Secondary | ICD-10-CM

## 2016-10-12 DIAGNOSIS — I2510 Atherosclerotic heart disease of native coronary artery without angina pectoris: Secondary | ICD-10-CM | POA: Diagnosis not present

## 2016-10-12 DIAGNOSIS — I4891 Unspecified atrial fibrillation: Secondary | ICD-10-CM

## 2016-10-12 LAB — BASIC METABOLIC PANEL
BUN/Creatinine Ratio: 16 (ref 12–28)
BUN: 15 mg/dL (ref 10–36)
CO2: 25 mmol/L (ref 18–29)
Calcium: 9.6 mg/dL (ref 8.7–10.3)
Chloride: 103 mmol/L (ref 96–106)
Creatinine, Ser: 0.92 mg/dL (ref 0.57–1.00)
GFR calc Af Amer: 63 mL/min/{1.73_m2} (ref >59)
GFR calc non Af Amer: 54 mL/min/{1.73_m2} — ABNORMAL LOW (ref >59)
Glucose: 100 mg/dL — ABNORMAL HIGH (ref 65–99)
Potassium: 4.2 mmol/L (ref 3.5–5.2)
Sodium: 141 mmol/L (ref 134–144)

## 2016-10-12 MED ORDER — RIVAROXABAN 20 MG PO TABS: 20.0000 mg | ORAL_TABLET | Freq: Every day | ORAL | 6 refills | Status: DC

## 2016-10-12 NOTE — Patient Instructions (Addendum)
Your physician has recommended you make the following change in your medication:  1.) stop Eliquis 2.) start Xarelto (rivoroxaban) TAKE ONE TABLET ONE TIME PER DAY Your physician recommends that you return for lab work today (BMET)

## 2016-10-15 ENCOUNTER — Telehealth: Payer: Self-pay | Admitting: *Deleted

## 2016-10-15 DIAGNOSIS — I4891 Unspecified atrial fibrillation: Secondary | ICD-10-CM

## 2016-10-15 MED ORDER — RIVAROXABAN 15 MG PO TABS: 15.0000 mg | ORAL_TABLET | Freq: Every day | ORAL | 11 refills | Status: DC

## 2016-10-15 NOTE — Telephone Encounter (Signed)
Supple, Harlon Flor, RPH Fay Records, MD Cc: Rodman Key, RN    Agree with dose reduction for CrCl < 50. Would recommend repeat BMET in 3 months due to fluctuating SCr and close to CrCl cutoff for Xarelto dosing.

## 2016-10-15 NOTE — Telephone Encounter (Signed)
Informed patient and she verbalizes understanding. She will pick up Xarelto 15 mg prescription and start this evening and will not take any more Xarelto 20 mg. She is feeling better already after switching from Eliquis and wanted Dr. [name] to know. Appointment made for BMET in 3 mo.

## 2016-10-15 NOTE — Telephone Encounter (Signed)
-----   Message from Fay Records, MD sent at 10/15/2016  7:30 AM EDT ----- Creatinine clearance is 46  I would recomm Xarelto 15 mg  1x per day Stop Eliquis

## 2016-11-01 DIAGNOSIS — H353221 Exudative age-related macular degeneration, left eye, with active choroidal neovascularization: Secondary | ICD-10-CM | POA: Diagnosis not present

## 2016-11-06 DIAGNOSIS — M1711 Unilateral primary osteoarthritis, right knee: Secondary | ICD-10-CM | POA: Diagnosis not present

## 2016-11-13 DIAGNOSIS — M1711 Unilateral primary osteoarthritis, right knee: Secondary | ICD-10-CM | POA: Diagnosis not present

## 2016-11-14 DIAGNOSIS — H903 Sensorineural hearing loss, bilateral: Secondary | ICD-10-CM | POA: Diagnosis not present

## 2016-11-14 DIAGNOSIS — H8110 Benign paroxysmal vertigo, unspecified ear: Secondary | ICD-10-CM | POA: Diagnosis not present

## 2016-11-14 DIAGNOSIS — H6122 Impacted cerumen, left ear: Secondary | ICD-10-CM | POA: Diagnosis not present

## 2016-11-14 DIAGNOSIS — H8143 Vertigo of central origin, bilateral: Secondary | ICD-10-CM | POA: Diagnosis not present

## 2016-11-20 DIAGNOSIS — M1711 Unilateral primary osteoarthritis, right knee: Secondary | ICD-10-CM | POA: Diagnosis not present

## 2016-11-21 ENCOUNTER — Other Ambulatory Visit: Payer: Self-pay | Admitting: Dermatology

## 2016-11-21 DIAGNOSIS — L858 Other specified epidermal thickening: Secondary | ICD-10-CM | POA: Diagnosis not present

## 2016-11-21 DIAGNOSIS — D485 Neoplasm of uncertain behavior of skin: Secondary | ICD-10-CM | POA: Diagnosis not present

## 2016-11-21 DIAGNOSIS — L570 Actinic keratosis: Secondary | ICD-10-CM | POA: Diagnosis not present

## 2016-11-29 DIAGNOSIS — H353211 Exudative age-related macular degeneration, right eye, with active choroidal neovascularization: Secondary | ICD-10-CM | POA: Diagnosis not present

## 2016-11-29 DIAGNOSIS — H353221 Exudative age-related macular degeneration, left eye, with active choroidal neovascularization: Secondary | ICD-10-CM | POA: Diagnosis not present

## 2016-12-18 ENCOUNTER — Ambulatory Visit (HOSPITAL_COMMUNITY)
Admission: EM | Admit: 2016-12-18 | Discharge: 2016-12-18 | Disposition: A | Discharge status: Home / Self Care | Payer: Medicare Other | Attending: Emergency Medicine | Admitting: Emergency Medicine

## 2016-12-18 ENCOUNTER — Encounter (HOSPITAL_COMMUNITY): Payer: Self-pay | Admitting: Emergency Medicine

## 2016-12-18 DIAGNOSIS — L03116 Cellulitis of left lower limb: Secondary | ICD-10-CM

## 2016-12-18 MED ORDER — DOXYCYCLINE HYCLATE 100 MG PO CAPS: 100.0000 mg | ORAL_CAPSULE | Freq: Two times a day (BID) | ORAL | 0 refills | Status: DC

## 2016-12-18 NOTE — ED Triage Notes (Signed)
PT thinks she has an insect bite to left lower leg. PT reports swelling over area for 3 weeks

## 2016-12-18 NOTE — Discharge Instructions (Signed)
I prescribed doxycycline take 1 tablet twice a day. Do not pick at your wound as it needs time to heal, follow up with your primary care provider in 2 weeks as needed.

## 2016-12-18 NOTE — ED Provider Notes (Signed)
CSN: [id number]     Arrival date & time 12/18/16  1110 History   None    Chief Complaint  Patient presents with  . Insect Bite   (Consider location/radiation/quality/duration/timing/severity/associated sxs/prior Treatment) Shelly Obrien is a 83 y.o. female  With past Hx of hyperlipidemia, HTN, CAD, and MI who presents to the [facility] urgent care with a chief complaint of an insect bite to her left lower leg that occurred 3 weeks ago. She has had continued pain, she does not know what bit her. She denies fever, chills, muscle aches, n/v/d, or abdominal pain. No change in appetite, or other acute symptoms. She has been using tweezers to "dig" at the area because she believes there is a stinger there.    The history is provided by the patient.    Past Medical History:  Diagnosis Date  . Complication of anesthesia    hard to wake   . Coronary artery disease   . H/O: hysterectomy   . Hx of lumpectomy   . Hyperlipidemia   . Hypertension   . Kidney stones   . Myocardial infarct (HCC)    hx of  . PONV (postoperative nausea and vomiting)   . Wears glasses    Past Surgical History:  Procedure Laterality Date  . ABDOMINAL HYSTERECTOMY    . ANGIOPLASTY    . BROW LIFT Right 04/07/2014   Procedure: REPAIR OF ENTROPION AND TRICHIASIS OF RIGHT LOWER EYE LID ;  Surgeon: Theodoro Kos, DO;  Location: [facility];  Service: Plastics;  Laterality: Right;  . CARPAL TUNNEL RELEASE     left  . DILATION AND CURETTAGE OF UTERUS    . EYE SURGERY     both cataracts  . KNEE SURGERY     right   Family History  Problem Relation Age of Onset  . Coronary artery disease Unknown        positive family hx of  . Cancer Unknown        family hx of   Social History  Substance Use Topics  . Smoking status: Never Smoker  . Smokeless tobacco: Never Used  . Alcohol use No   OB History    No data available     Review of Systems  Constitutional: Negative.   HENT: Negative.     Respiratory: Negative.   Cardiovascular: Negative.   Musculoskeletal: Negative.   Skin: Positive for color change and wound.  Neurological: Negative for weakness and headaches.    Allergies  Antihistamines, chlorpheniramine-type; Penicillins; Contrast media [iodinated diagnostic agents]; Sulfonamide derivatives; Atorvastatin; and Pheniramine  Home Medications   Prior to Admission medications   Medication Sig Start Date End Date Taking? Authorizing Provider  amLODipine (NORVASC) 2.5 MG tablet Take 0.5 tablets (1.25 mg total) by mouth daily. 05/14/16 10/12/16  Fay Records, MD  doxycycline (VIBRAMYCIN) 100 MG capsule Take 1 capsule (100 mg total) by mouth 2 (two) times daily. 12/18/16   Barnet Glasgow, NP  metoprolol tartrate (LOPRESSOR) 25 MG tablet Take 1 tablet (25 mg total) by mouth 2 (two) times daily. 02/27/16   Ghimire, Henreitta Leber, MD  Multiple Vitamins-Minerals (ICAPS) CAPS Take 1 capsule by mouth every morning.     [provider]  Rivaroxaban (XARELTO) 15 MG TABS tablet Take 1 tablet (15 mg total) by mouth daily with supper. 10/15/16   Fay Records, MD  triamcinolone (NASACORT ALLERGY 24HR) 55 MCG/ACT AERO nasal inhaler Place 2 sprays into the nose daily as  needed (allergies).    [provider]   Meds Ordered and Administered this Visit  Medications - No data to display  BP (!) 147/80 (BP Location: Left Arm) Comment: rn notified  Pulse 70   Temp 97.9 F (36.6 C) (Oral)   Resp 16   Ht 5\' 5"  (1.651 m)   Wt 160 lb (72.6 kg)   SpO2 100%   BMI 26.63 kg/m  No data found.   Physical Exam  Constitutional: She is oriented to person, place, and time. She appears well-developed and well-nourished. No distress.  HENT:  Head: Normocephalic and atraumatic.  Right Ear: External ear normal.  Left Ear: External ear normal.  Cardiovascular: Normal rate and regular rhythm.   Pulmonary/Chest: Effort normal and breath sounds normal.  Neurological: She is alert and  oriented to person, place, and time.  Skin: Skin is warm and dry. Capillary refill takes less than 2 seconds. She is not diaphoretic. There is erythema.     Approximately 1 x 2 cm erythemic lesion left lower leg with a central area of scabbing and scaling, is warm to touch and tender on palpation.  Psychiatric: She has a normal mood and affect. Her behavior is normal.  Nursing note and vitals reviewed.   Urgent Care Course     Procedures (including critical care time)  Labs Review Labs Reviewed - No data to display  Imaging Review No results found.   MDM   1. Cellulitis of left lower extremity    Treating. Been with a short course of doxycycline for infection, follow up with primary care in 1-2 weeks.    Barnet Glasgow, NP 12/18/16 1209

## 2016-12-27 DIAGNOSIS — H353231 Exudative age-related macular degeneration, bilateral, with active choroidal neovascularization: Secondary | ICD-10-CM | POA: Diagnosis not present

## 2016-12-27 DIAGNOSIS — H353221 Exudative age-related macular degeneration, left eye, with active choroidal neovascularization: Secondary | ICD-10-CM | POA: Diagnosis not present

## 2017-01-02 DIAGNOSIS — M1711 Unilateral primary osteoarthritis, right knee: Secondary | ICD-10-CM | POA: Diagnosis not present

## 2017-01-09 DIAGNOSIS — C44722 Squamous cell carcinoma of skin of right lower limb, including hip: Secondary | ICD-10-CM | POA: Diagnosis not present

## 2017-01-10 ENCOUNTER — Encounter: Payer: Self-pay | Admitting: Neurology

## 2017-01-10 ENCOUNTER — Ambulatory Visit (INDEPENDENT_AMBULATORY_CARE_PROVIDER_SITE_OTHER): Payer: Medicare Other | Admitting: Neurology

## 2017-01-10 ENCOUNTER — Telehealth: Payer: Self-pay | Admitting: Neurology

## 2017-01-10 VITALS — BP 146/93 | HR 65 | Ht 65.5 in | Wt 163.6 lb

## 2017-01-10 DIAGNOSIS — M79602 Pain in left arm: Secondary | ICD-10-CM | POA: Diagnosis not present

## 2017-01-10 DIAGNOSIS — I2510 Atherosclerotic heart disease of native coronary artery without angina pectoris: Secondary | ICD-10-CM | POA: Diagnosis not present

## 2017-01-10 DIAGNOSIS — R202 Paresthesia of skin: Secondary | ICD-10-CM

## 2017-01-10 DIAGNOSIS — G8929 Other chronic pain: Secondary | ICD-10-CM | POA: Diagnosis not present

## 2017-01-10 DIAGNOSIS — R29898 Other symptoms and signs involving the musculoskeletal system: Secondary | ICD-10-CM

## 2017-01-10 DIAGNOSIS — M542 Cervicalgia: Secondary | ICD-10-CM

## 2017-01-10 DIAGNOSIS — R42 Dizziness and giddiness: Secondary | ICD-10-CM | POA: Diagnosis not present

## 2017-01-10 DIAGNOSIS — M5412 Radiculopathy, cervical region: Secondary | ICD-10-CM

## 2017-01-10 DIAGNOSIS — R29818 Other symptoms and signs involving the nervous system: Secondary | ICD-10-CM | POA: Diagnosis not present

## 2017-01-10 NOTE — Addendum Note (Signed)
Addended by: Sarina Ill B on: 01/10/2017 02:09 PM   Modules accepted: Orders

## 2017-01-10 NOTE — Progress Notes (Addendum)
[facility]    Provider:  Dr Jaynee Eagles Referring Provider: Minna Merritts Primary Care Physician:  [name], MD  CC:  Benign positional vertigo  HPI:  Shelly Obrien is a 83 y.o. female here as a referral for benign positional vertigo. 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 She has multiple allergies.  Patient reports she has neck pain and dizziness. She has had dizziness off and on for years and pain on the left side of her neck, pain from the neck and shooting into the left arm. It is the worst paoin she feels. She can elicit the pain with neck movement and weakness and numbness in the left hand. The pain is severe and progressive, sharp and brief, happens daily like when she is putting on her bra when she moves her arm. She have dizziness on and off, it will "hit" 1-2 times a day for several minutes and she feels very dizzy like she is "going out", and imbalance during the episodes, she feels unsteady and dizzy. If happens with sudden movement of the neck such as when turning to the left or back. Happens when she roll sover to the left side. No other focal neurologic deficits, associated symptoms, inciting events or modifiable factors.  Reviewed notes, labs and imaging from outside physicians, which showed:  Reviewed notes from Dr. Jeneen Rinks crossly ear nose and throat. Diagnosis was impacted serum in left ear, benign paroxysmal vertigo, vertigo of central origin bilaterally. Patient presented with vertigo for approximately 3 minutes when she turns her head a certain way and does get a three-minute episode of vertigo. We secondly she could be having some orthostatic hypotension because when she bends down he gets up suddenly she  also feels very lightheaded. Exam showed years to be quite excellent. She has had serum and impaction of the left ear which he removed. No otitis externa or otitis media. Audiogram showed a normal impedance with a high-frequency sensorineural hearing loss. Remaining exam was normal.  Review of Systems: Patient complains of symptoms per HPI as well as the following symptoms: no CP, no SOB. Pertinent negatives and positives per HPI. All others negative.   Social History   Social History  . Marital status: Widowed    Spouse name: N/A  . Number of children: 2  . Years of education: N/A   Occupational History  . Retired    Social History Main Topics  . Smoking status: Former Research scientist (life sciences)  . Smokeless tobacco: Never Used     Comment: Tried as s a teen  . Alcohol use No  . Drug use: No  . Sexual activity: Not on file   Other Topics Concern  . Not on file   Social History Narrative   Lives at home    Family History  Problem Relation Age of Onset  . Coronary artery disease Unknown        positive family hx of  . Cancer Unknown        family hx of  . Brain cancer Son   . Liver cancer Daughter     Past Medical History:  Diagnosis Date  . Complication of anesthesia    hard to wake   . Coronary artery disease   . H/O: hysterectomy   . Hx of lumpectomy   . Hyperlipidemia   .  Hypertension   . Kidney stones   . Myocardial infarct (HCC)    hx of  . PONV (postoperative nausea and vomiting)   . Wears glasses     Past Surgical History:  Procedure Laterality Date  . ABDOMINAL HYSTERECTOMY    . ANGIOPLASTY    . BROW LIFT Right 04/07/2014   Procedure: REPAIR OF ENTROPION AND TRICHIASIS OF RIGHT LOWER EYE LID ;  Surgeon: Theodoro Kos, DO;  Location: [facility];  Service: Plastics;  Laterality: Right;  . CARPAL TUNNEL RELEASE     left  . DILATION AND CURETTAGE OF UTERUS    . EYE SURGERY     both cataracts  . KNEE SURGERY     right    Current Outpatient  Prescriptions  Medication Sig Dispense Refill  . amLODipine (NORVASC) 2.5 MG tablet TK 1 T PO QD  11  . metoprolol tartrate (LOPRESSOR) 25 MG tablet Take 1 tablet (25 mg total) by mouth 2 (two) times daily. 60 tablet 0  . Multiple Vitamins-Minerals (ICAPS) CAPS Take 1 capsule by mouth every morning.     . Rivaroxaban (XARELTO) 15 MG TABS tablet Take 1 tablet (15 mg total) by mouth daily with supper. 30 tablet 11  . amLODipine (NORVASC) 2.5 MG tablet Take 0.5 tablets (1.25 mg total) by mouth daily. 180 tablet 3  . triamcinolone (NASACORT ALLERGY 24HR) 55 MCG/ACT AERO nasal inhaler Place 2 sprays into the nose daily as needed (allergies).     No current facility-administered medications for this visit.     Allergies as of 01/10/2017 - Review Complete 01/10/2017  Allergen Reaction Noted  . Antihistamines, chlorpheniramine-type  02/25/2015  . Penicillins Anaphylaxis and Swelling 12/29/2008  . Contrast media [iodinated diagnostic agents] Hives 05/17/2013  . Sulfonamide derivatives Other (See Comments) 12/29/2008  . Atorvastatin Other (See Comments) 02/16/2016  . Pheniramine Other (See Comments) 09/25/2015    Vitals: BP (!) 146/93   Pulse 65   Ht 5' 5.5" (1.664 m)   Wt 163 lb 9.6 oz (74.2 kg)   BMI 26.81 kg/m  Last Weight:  Wt Readings from Last 1 Encounters:  01/10/17 163 lb 9.6 oz (74.2 kg)   Last Height:   Ht Readings from Last 1 Encounters:  01/10/17 5' 5.5" (1.664 m)     Physical exam: Exam: Gen: NAD, conversant, well nourised, obese, well groomed                     CV: irregular, no MRG. No Carotid Bruits. No peripheral edema, warm, nontender Eyes: Conjunctivae clear without exudates or hemorrhage  Neuro: Detailed Neurologic Exam  Speech:    Speech is normal; fluent and spontaneous with normal comprehension.  Cognition:    The patient is oriented to person, place, and time;     recent and remote memory impaired ;     language fluent;     Impaired attention,  concentration,     fund of knowledge Cranial Nerves:    The pupils are equal, round, and reactive to light. Attempted fundoscopic exam could not visualize. Mild dysconjugate gaze(right eye elevation and exzotropia) but EOMI. Visual fields are full to finger confrontation. Trigeminal sensation is intact and the muscles of mastication are normal. The face is symmetric. The palate elevates in the midline. Hearing impaired. Voice is normal. Shoulder shrug is normal. The tongue has normal motion without fasciculations.   Coordination:    No dysmetria  Gait:    Antalgic, recent left leg  surgery (bandaged currently)  Motor Observation:    No asymmetry, no atrophy, and no involuntary movements noted. Tone:    Normal muscle tone.    Posture:    Posture is normal. normal erect    Strength: Mild proximal weakness more pronounced left deltoid and triceps 4/5.      Sensation: intact to LT     Reflex Exam:  DTR's:    Deep tendon reflexes in the upper and lower extremities are equivocal bilaterally.   Toes:    The toes are downgoing bilaterally.   Clonus:    Clonus is absent.     Assessment/Plan:  An absolutely lovely 83 y.o. female here as a referral for benign positional vertigo. Patient has past medical history hyperlipidemia, essential hypertension, myocardial infarction, coronary artery disease, cholelithiasis, H fibrillation, chronic kidney disease, stage III macular degeneration, ectropion of the right eye secondary to an excision of the lesion on her lower leg  - Patient has focal weakness of the left arm, neck pain, radicular pain of the left side that is exacerbated with head movements need MRI of the cervical spine to evaluate for radiculopathy for surgical interventions or epidural steroid injections. She has tried conservative measures for several months including resting, analgesics. - Dizziness: Patient has had dizziness for years, off-and-on, she feels dizzy over the last 3  weeks, exacerbated with head movements especially head extension or rolling over to her left side. Appears to be benign positional vertigo and will send her to vestibular therapy. However need to rule out other intracerebral causes such as schwannoma, strokes or other intracranial lesions. Patient requests an open MRI due to claustrophobia. Discussed fall precautions   Orders Placed This Encounter  Procedures  . MR BRAIN W WO CONTRAST  . MR CERVICAL SPINE WO CONTRAST  . Basic Metabolic Panel  . Ambulatory referral to Physical Therapy     Sarina Ill, MD  [facility] [address] [place], [place] [address]  Phone [phone] Fax [phone]

## 2017-01-10 NOTE — Patient Instructions (Addendum)
Remember to drink plenty of fluid, eat healthy meals and do not skip any meals. Try to eat protein with a every meal and eat a healthy snack such as fruit or nuts in between meals. Try to keep a regular sleep-wake schedule and try to exercise daily, particularly in the form of walking, 20-30 minutes a day, if you can.   As far as diagnostic testing: MRI of the brain and neck, Lab today, vestibular therapy  I would like to see you back 3 months, sooner if we need to. Please call us with any interim questions, concerns, problems, updates or refill requests.   Our phone number is [phone]. We also have an after hours call service for urgent matters and there is a physician on-call for urgent questions. For any emergencies you know to call 911 or go to the nearest emergency room   Dizziness Dizziness is a common problem. It is a feeling of unsteadiness or light-headedness. You may feel like you are about to faint. Dizziness can lead to injury if you stumble or fall. Anyone can become dizzy, but dizziness is more common in older adults. This condition can be caused by a number of things, including medicines, dehydration, or illness. Follow these instructions at home: Taking these steps may help with your condition: Eating and drinking  Drink enough fluid to keep your urine clear or pale yellow. This helps to keep you from becoming dehydrated. Try to drink more clear fluids, such as water.  Do not drink alcohol.  Limit your caffeine intake if directed by your health care provider.  Limit your salt intake if directed by your health care provider. Activity  Avoid making quick movements. ? Rise slowly from chairs and steady yourself until you feel okay. ? In the morning, first sit up on the side of the bed. When you feel okay, stand slowly while you hold onto something until you know that your balance is fine.  Move your legs often if you need to stand in one place for a long time. Tighten and  relax your muscles in your legs while you are standing.  Do not drive or operate heavy machinery if you feel dizzy.  Avoid bending down if you feel dizzy. Place items in your home so that they are easy for you to reach without leaning over. Lifestyle  Do not use any tobacco products, including cigarettes, chewing tobacco, or electronic cigarettes. If you need help quitting, ask your health care provider.  Try to reduce your stress level, such as with yoga or meditation. Talk with your health care provider if you need help. General instructions  Watch your dizziness for any changes.  Take medicines only as directed by your health care provider. Talk with your health care provider if you think that your dizziness is caused by a medicine that you are taking.  Tell a friend or a family member that you are feeling dizzy. If he or she notices any changes in your behavior, have this person call your health care provider.  Keep all follow-up visits as directed by your health care provider. This is important. Contact a health care provider if:  Your dizziness does not go away.  Your dizziness or light-headedness gets worse.  You feel nauseous.  You have reduced hearing.  You have new symptoms.  You are unsteady on your feet or you feel like the room is spinning. Get help right away if:  You vomit or have diarrhea and are unable to  eat or drink anything.  You have problems talking, walking, swallowing, or using your arms, hands, or legs.  You feel generally weak.  You are not thinking clearly or you have trouble forming sentences. It may take a friend or family member to notice this.  You have chest pain, abdominal pain, shortness of breath, or sweating.  Your vision changes.  You notice any bleeding.  You have a headache.  You have neck pain or a stiff neck.  You have a fever. This information is not intended to replace advice given to you by your health care provider.  Make sure you discuss any questions you have with your health care provider. Document Released: 11/14/2000 Document Revised: 10/27/2015 Document Reviewed: 05/17/2014 Elsevier Interactive Patient Education  2017 [employer].

## 2017-01-10 NOTE — Telephone Encounter (Signed)
[facility] will please look  at Dr. Cathren Laine note and order referral for her please. Thanks Hinton Dyer.

## 2017-01-11 ENCOUNTER — Other Ambulatory Visit: Payer: Medicare Other

## 2017-01-11 LAB — BASIC METABOLIC PANEL
BUN/Creatinine Ratio: 13 (ref 12–28)
BUN: 14 mg/dL (ref 10–36)
CO2: 25 mmol/L (ref 20–29)
Calcium: 9.8 mg/dL (ref 8.7–10.3)
Chloride: 104 mmol/L (ref 96–106)
Creatinine, Ser: 1.12 mg/dL — ABNORMAL HIGH (ref 0.57–1.00)
GFR calc Af Amer: 49 mL/min/{1.73_m2} — ABNORMAL LOW (ref >59)
GFR calc non Af Amer: 43 mL/min/{1.73_m2} — ABNORMAL LOW (ref >59)
Glucose: 101 mg/dL — ABNORMAL HIGH (ref 65–99)
Potassium: 4.6 mmol/L (ref 3.5–5.2)
Sodium: 143 mmol/L (ref 134–144)

## 2017-01-14 ENCOUNTER — Telehealth: Payer: Self-pay | Admitting: Neurology

## 2017-01-14 DIAGNOSIS — R29898 Other symptoms and signs involving the musculoskeletal system: Secondary | ICD-10-CM

## 2017-01-14 DIAGNOSIS — M542 Cervicalgia: Secondary | ICD-10-CM

## 2017-01-14 DIAGNOSIS — R200 Anesthesia of skin: Secondary | ICD-10-CM

## 2017-01-14 DIAGNOSIS — R42 Dizziness and giddiness: Secondary | ICD-10-CM

## 2017-01-14 NOTE — Addendum Note (Signed)
Addended by: Monte Fantasia on: 01/14/2017 12:24 PM   Modules accepted: Orders

## 2017-01-14 NOTE — Telephone Encounter (Signed)
Noted I will send the order to GI

## 2017-01-14 NOTE — Telephone Encounter (Signed)
Returned pt's call to discuss if she'd like to proceed w/ contrast if pre-medicated. Allergy list notes hives as reaction. However, pt reported that she had severe skin peeling almost like a sunburn the last time that she had contrast and it took months of treatment for it to resolve. Let her know that MRI images w/o contrast will not be as clear as w/ contrast. She verbalized understanding and would like to proceed w/ MRI withOUT contrast. Voiced appreciation for call.

## 2017-01-14 NOTE — Telephone Encounter (Signed)
Patient calling. She has an order for an MRI and received a call from [facility] to schedule. The patient states she cannot have contrast because has had reaction in the past.

## 2017-01-14 NOTE — Addendum Note (Signed)
Addended by: Monte Fantasia on: 01/14/2017 12:23 PM   Modules accepted: Orders

## 2017-01-15 ENCOUNTER — Telehealth: Payer: Self-pay

## 2017-01-15 NOTE — Telephone Encounter (Signed)
Called pt w/ lab results (elevated creatinine). Reports that she saw cardiologist last Friday and that they are monitoring her kidney function as well and may call back to adjust BP meds. MRIs scheduled in a couple of weeks for pt to have without contrast. Verbalized understanding and appreciation for call.

## 2017-01-20 ENCOUNTER — Encounter (HOSPITAL_COMMUNITY)
Admission: EM | Disposition: A | Discharge status: Home / Self Care | Payer: Self-pay | Source: Home / Self Care | Attending: Emergency Medicine

## 2017-01-20 ENCOUNTER — Observation Stay (HOSPITAL_COMMUNITY)
Admission: EM | Admit: 2017-01-20 | Discharge: 2017-01-21 | Disposition: A | Discharge status: Home / Self Care | Payer: Medicare Other | Attending: Urology | Admitting: Urology

## 2017-01-20 ENCOUNTER — Emergency Department (HOSPITAL_COMMUNITY): Payer: Medicare Other

## 2017-01-20 ENCOUNTER — Encounter (HOSPITAL_COMMUNITY): Payer: Self-pay

## 2017-01-20 ENCOUNTER — Observation Stay (HOSPITAL_COMMUNITY): Payer: Medicare Other | Admitting: Certified Registered Nurse Anesthetist

## 2017-01-20 ENCOUNTER — Observation Stay (HOSPITAL_COMMUNITY): Payer: Medicare Other

## 2017-01-20 DIAGNOSIS — R1011 Right upper quadrant pain: Secondary | ICD-10-CM | POA: Diagnosis not present

## 2017-01-20 DIAGNOSIS — R390 Extravasation of urine: Secondary | ICD-10-CM | POA: Diagnosis not present

## 2017-01-20 DIAGNOSIS — Z7901 Long term (current) use of anticoagulants: Secondary | ICD-10-CM | POA: Insufficient documentation

## 2017-01-20 DIAGNOSIS — R8271 Bacteriuria: Secondary | ICD-10-CM | POA: Insufficient documentation

## 2017-01-20 DIAGNOSIS — Z888 Allergy status to other drugs, medicaments and biological substances status: Secondary | ICD-10-CM | POA: Insufficient documentation

## 2017-01-20 DIAGNOSIS — Z880 Allergy status to penicillin: Secondary | ICD-10-CM | POA: Insufficient documentation

## 2017-01-20 DIAGNOSIS — Z882 Allergy status to sulfonamides status: Secondary | ICD-10-CM | POA: Diagnosis not present

## 2017-01-20 DIAGNOSIS — N132 Hydronephrosis with renal and ureteral calculous obstruction: Principal | ICD-10-CM | POA: Diagnosis present

## 2017-01-20 DIAGNOSIS — Z87891 Personal history of nicotine dependence: Secondary | ICD-10-CM | POA: Diagnosis not present

## 2017-01-20 DIAGNOSIS — I2510 Atherosclerotic heart disease of native coronary artery without angina pectoris: Secondary | ICD-10-CM | POA: Diagnosis not present

## 2017-01-20 DIAGNOSIS — N368 Other specified disorders of urethra: Secondary | ICD-10-CM | POA: Diagnosis not present

## 2017-01-20 DIAGNOSIS — I252 Old myocardial infarction: Secondary | ICD-10-CM | POA: Insufficient documentation

## 2017-01-20 DIAGNOSIS — E785 Hyperlipidemia, unspecified: Secondary | ICD-10-CM | POA: Diagnosis not present

## 2017-01-20 DIAGNOSIS — N201 Calculus of ureter: Secondary | ICD-10-CM | POA: Diagnosis not present

## 2017-01-20 DIAGNOSIS — Z87442 Personal history of urinary calculi: Secondary | ICD-10-CM

## 2017-01-20 DIAGNOSIS — I10 Essential (primary) hypertension: Secondary | ICD-10-CM | POA: Diagnosis not present

## 2017-01-20 DIAGNOSIS — Z79899 Other long term (current) drug therapy: Secondary | ICD-10-CM | POA: Insufficient documentation

## 2017-01-20 DIAGNOSIS — I4891 Unspecified atrial fibrillation: Secondary | ICD-10-CM | POA: Diagnosis not present

## 2017-01-20 DIAGNOSIS — N202 Calculus of kidney with calculus of ureter: Secondary | ICD-10-CM | POA: Diagnosis not present

## 2017-01-20 DIAGNOSIS — N200 Calculus of kidney: Secondary | ICD-10-CM | POA: Diagnosis not present

## 2017-01-20 HISTORY — PX: CYSTOSCOPY W/ URETERAL STENT PLACEMENT: SHX1429

## 2017-01-20 HISTORY — DX: Personal history of urinary calculi: Z87.442

## 2017-01-20 LAB — URINALYSIS, ROUTINE W REFLEX MICROSCOPIC
Bilirubin Urine: NEGATIVE
Bilirubin Urine: NEGATIVE
Glucose, UA: 50 mg/dL — AB
Glucose, UA: 50 mg/dL — AB
Ketones, ur: 5 mg/dL — AB
Ketones, ur: 5 mg/dL — AB
Nitrite: NEGATIVE
Nitrite: NEGATIVE
Protein, ur: NEGATIVE mg/dL
Protein, ur: NEGATIVE mg/dL
Specific Gravity, Urine: 1.012 (ref 1.005–1.030)
Specific Gravity, Urine: 1.012 (ref 1.005–1.030)
pH: 6 (ref 5.0–8.0)
pH: 6 (ref 5.0–8.0)

## 2017-01-20 LAB — BASIC METABOLIC PANEL
Anion gap: 7 (ref 5–15)
BUN: 21 mg/dL — ABNORMAL HIGH (ref 6–20)
CO2: 25 mmol/L (ref 22–32)
Calcium: 9.6 mg/dL (ref 8.9–10.3)
Chloride: 107 mmol/L (ref 101–111)
Creatinine, Ser: 1.3 mg/dL — ABNORMAL HIGH (ref 0.44–1.00)
GFR calc Af Amer: 40 mL/min — ABNORMAL LOW (ref >60)
GFR calc non Af Amer: 35 mL/min — ABNORMAL LOW (ref >60)
Glucose, Bld: 181 mg/dL — ABNORMAL HIGH (ref 65–99)
Potassium: 4.1 mmol/L (ref 3.5–5.1)
Sodium: 139 mmol/L (ref 135–145)

## 2017-01-20 LAB — CBC WITH DIFFERENTIAL/PLATELET
Basophils Absolute: 0 10*3/uL (ref 0.0–0.1)
Basophils Relative: 0 %
Eosinophils Absolute: 0.1 10*3/uL (ref 0.0–0.7)
Eosinophils Relative: 1 %
HCT: 44.4 % (ref 36.0–46.0)
Hemoglobin: 14.4 g/dL (ref 12.0–15.0)
Lymphocytes Relative: 14 %
Lymphs Abs: 1.4 10*3/uL (ref 0.7–4.0)
MCH: 30.9 pg (ref 26.0–34.0)
MCHC: 32.4 g/dL (ref 30.0–36.0)
MCV: 95.3 fL (ref 78.0–100.0)
Monocytes Absolute: 0.5 10*3/uL (ref 0.1–1.0)
Monocytes Relative: 5 %
Neutro Abs: 8 10*3/uL — ABNORMAL HIGH (ref 1.7–7.7)
Neutrophils Relative %: 80 %
Platelets: 192 10*3/uL (ref 150–400)
RBC: 4.66 MIL/uL (ref 3.87–5.11)
RDW: 14.5 % (ref 11.5–15.5)
WBC: 9.9 10*3/uL (ref 4.0–10.5)

## 2017-01-20 SURGERY — CYSTOSCOPY, WITH RETROGRADE PYELOGRAM AND URETERAL STENT INSERTION
Anesthesia: General | Site: Ureter | Laterality: Right

## 2017-01-20 MED ORDER — CIPROFLOXACIN IN D5W 400 MG/200ML IV SOLN
400.0000 mg | Freq: Two times a day (BID) | INTRAVENOUS | Status: DC
  Administered 2017-01-20: 400 mg via INTRAVENOUS
  Filled 2017-01-20: qty 200

## 2017-01-20 MED ORDER — CIPROFLOXACIN IN D5W 400 MG/200ML IV SOLN
400.0000 mg | INTRAVENOUS | Status: DC
  Administered 2017-01-21: 400 mg via INTRAVENOUS
  Filled 2017-01-20: qty 200

## 2017-01-20 MED ORDER — AMLODIPINE BESYLATE 5 MG PO TABS
2.5000 mg | ORAL_TABLET | Freq: Every day | ORAL | Status: DC
  Administered 2017-01-20 – 2017-01-21 (×2): 2.5 mg via ORAL
  Filled 2017-01-20 (×2): qty 1

## 2017-01-20 MED ORDER — ONDANSETRON HCL 4 MG/2ML IJ SOLN
4.0000 mg | INTRAMUSCULAR | Status: DC | PRN
  Administered 2017-01-20: 4 mg via INTRAVENOUS

## 2017-01-20 MED ORDER — ACETAMINOPHEN 500 MG PO TABS
1000.0000 mg | ORAL_TABLET | Freq: Three times a day (TID) | ORAL | Status: DC
  Administered 2017-01-20 – 2017-01-21 (×4): 1000 mg via ORAL
  Filled 2017-01-20 (×4): qty 2

## 2017-01-20 MED ORDER — FENTANYL CITRATE (PF) 100 MCG/2ML IJ SOLN: 25.0000 ug | INTRAMUSCULAR | Status: DC | PRN

## 2017-01-20 MED ORDER — LIDOCAINE HCL (CARDIAC) 20 MG/ML IV SOLN
INTRAVENOUS | Status: DC | PRN
  Administered 2017-01-20: 50 mg via INTRAVENOUS

## 2017-01-20 MED ORDER — SENNA 8.6 MG PO TABS
1.0000 | ORAL_TABLET | Freq: Two times a day (BID) | ORAL | Status: DC
  Administered 2017-01-20 – 2017-01-21 (×3): 8.6 mg via ORAL
  Filled 2017-01-20 (×3): qty 1

## 2017-01-20 MED ORDER — LACTATED RINGERS IV SOLN
INTRAVENOUS | Status: DC | PRN
  Administered 2017-01-20: 11:00:00 via INTRAVENOUS

## 2017-01-20 MED ORDER — FENTANYL CITRATE (PF) 100 MCG/2ML IJ SOLN
50.0000 ug | Freq: Once | INTRAMUSCULAR | Status: AC
  Administered 2017-01-20: 50 ug via INTRAVENOUS
  Filled 2017-01-20: qty 2

## 2017-01-20 MED ORDER — RIVAROXABAN 15 MG PO TABS
15.0000 mg | ORAL_TABLET | Freq: Every day | ORAL | Status: DC
  Administered 2017-01-20: 15 mg via ORAL
  Filled 2017-01-20: qty 1

## 2017-01-20 MED ORDER — ONDANSETRON HCL 4 MG/2ML IJ SOLN
4.0000 mg | Freq: Once | INTRAMUSCULAR | Status: AC
  Administered 2017-01-20: 4 mg via INTRAVENOUS
  Filled 2017-01-20: qty 2

## 2017-01-20 MED ORDER — IOPAMIDOL (ISOVUE-300) INJECTION 61%
INTRAVENOUS | Status: AC
  Filled 2017-01-20: qty 50

## 2017-01-20 MED ORDER — DEXAMETHASONE SODIUM PHOSPHATE 4 MG/ML IJ SOLN
INTRAMUSCULAR | Status: DC | PRN
  Administered 2017-01-20: 10 mg via INTRAVENOUS

## 2017-01-20 MED ORDER — METOPROLOL TARTRATE 25 MG PO TABS
25.0000 mg | ORAL_TABLET | Freq: Every day | ORAL | Status: DC
  Administered 2017-01-20 – 2017-01-21 (×2): 25 mg via ORAL
  Filled 2017-01-20 (×2): qty 1

## 2017-01-20 MED ORDER — METOCLOPRAMIDE HCL 5 MG/ML IJ SOLN: 10.0000 mg | Freq: Once | INTRAMUSCULAR | Status: DC | PRN

## 2017-01-20 MED ORDER — ONDANSETRON HCL 4 MG/2ML IJ SOLN
INTRAMUSCULAR | Status: AC
  Filled 2017-01-20: qty 2

## 2017-01-20 MED ORDER — KCL IN DEXTROSE-NACL 20-5-0.45 MEQ/L-%-% IV SOLN
INTRAVENOUS | Status: DC
  Administered 2017-01-20 (×2): via INTRAVENOUS
  Filled 2017-01-20 (×3): qty 1000

## 2017-01-20 MED ORDER — IOPAMIDOL (ISOVUE-300) INJECTION 61%
INTRAVENOUS | Status: DC | PRN
  Administered 2017-01-20: 6 mL via URETHRAL

## 2017-01-20 MED ORDER — FENTANYL CITRATE (PF) 100 MCG/2ML IJ SOLN
50.0000 ug | Freq: Once | INTRAMUSCULAR | Status: AC
  Administered 2017-01-20: 50 ug via INTRAVENOUS

## 2017-01-20 MED ORDER — SODIUM CHLORIDE 0.9 % IR SOLN
Status: DC | PRN
  Administered 2017-01-20: 3000 mL

## 2017-01-20 MED ORDER — PROPOFOL 10 MG/ML IV BOLUS
INTRAVENOUS | Status: DC | PRN
  Administered 2017-01-20: 100 mg via INTRAVENOUS

## 2017-01-20 MED ORDER — DEXAMETHASONE SODIUM PHOSPHATE 10 MG/ML IJ SOLN
INTRAMUSCULAR | Status: AC
  Filled 2017-01-20: qty 1

## 2017-01-20 MED ORDER — MORPHINE SULFATE (PF) 2 MG/ML IV SOLN: 2.0000 mg | INTRAVENOUS | Status: DC | PRN

## 2017-01-20 MED ORDER — TRAMADOL HCL 50 MG PO TABS: 50.0000 mg | ORAL_TABLET | Freq: Four times a day (QID) | ORAL | Status: DC | PRN

## 2017-01-20 SURGICAL SUPPLY — 15 items
BAG URO CATCHER STRL LF (MISCELLANEOUS) ×2 IMPLANT
BASKET ZERO TIP NITINOL 2.4FR (BASKET) IMPLANT
BSKT STON RTRVL ZERO TP 2.4FR (BASKET)
CATH INTERMIT  6FR 70CM (CATHETERS) ×1 IMPLANT
CLOTH BEACON ORANGE TIMEOUT ST (SAFETY) ×2 IMPLANT
COVER FOOT SWITCH UNIV DISP (DRAPES) IMPLANT
COVER SURGICAL LIGHT HANDLE (MISCELLANEOUS) ×2 IMPLANT
GLOVE BIOGEL M STRL SZ7.5 (GLOVE) ×2 IMPLANT
GOWN STRL REUS W/TWL LRG LVL3 (GOWN DISPOSABLE) ×4 IMPLANT
GUIDEWIRE ANG ZIPWIRE 038X150 (WIRE) IMPLANT
GUIDEWIRE STR DUAL SENSOR (WIRE) ×2 IMPLANT
MANIFOLD NEPTUNE II (INSTRUMENTS) ×2 IMPLANT
PACK CYSTO (CUSTOM PROCEDURE TRAY) ×2 IMPLANT
STENT POLARIS 5FRX24 (STENTS) ×1 IMPLANT
TUBING CONNECTING 10 (TUBING) ×2 IMPLANT

## 2017-01-20 NOTE — Brief Op Note (Signed)
01/20/2017  11:11 AM  PATIENT:  Shelly Obrien  83 y.o. female  PRE-OPERATIVE DIAGNOSIS:  right ureteral stone  POST-OPERATIVE DIAGNOSIS:  right ureteral stone  PROCEDURE:  Procedure(s): CYSTOSCOPY WITH RIGHT RETROGRADE PYELOGRAM/RIGHT URETERAL STENT PLACEMENT (Right)  SURGEON:  Surgeon(s) and Role:    Alexis Frock, MD - Primary  PHYSICIAN ASSISTANT:   ASSISTANTS: none   ANESTHESIA:   general  EBL:  No intake/output data recorded.  BLOOD ADMINISTERED:none  DRAINS: none   LOCAL MEDICATIONS USED:  NONE  SPECIMEN:  No Specimen  DISPOSITION OF SPECIMEN:  N/A  COUNTS:  YES  TOURNIQUET:  * No tourniquets in log *  DICTATION: .Other Dictation: Dictation Number [id number]  PLAN OF CARE: Admit for overnight observation  PATIENT DISPOSITION:  PACU - hemodynamically stable.   Delay start of Pharmacological VTE agent (>24hrs) due to surgical blood loss or risk of bleeding: yes

## 2017-01-20 NOTE — Interval H&P Note (Signed)
History and Physical Interval Note:  01/20/2017 10:49 AM  Shelly Obrien  has presented today for surgery, with the diagnosis of right ureteral stone  The various methods of treatment have been discussed with the patient and family. After consideration of risks, benefits and other options for treatment, the patient has consented to  Procedure(s): CYSTOSCOPY WITH RETROGRADE PYELOGRAM/URETERAL STENT PLACEMENT (Right) as a surgical intervention .  The patient's history has been reviewed, patient examined, no change in status, stable for surgery.  I have reviewed the patient's chart and labs.  Questions were answered to the patient's satisfaction.     Cristhian Vanhook

## 2017-01-20 NOTE — Progress Notes (Signed)
Bed approved for 1405. RN may call report to Gregary Signs [phone] at 8:20am.

## 2017-01-20 NOTE — ED Triage Notes (Signed)
Right flank pain that radiates to abdomen with hx of kidney stones pain has been going on for about 4 hours with nausea without vomiting.

## 2017-01-20 NOTE — H&P (Signed)
Shelly Obrien is an 83 y.o. female.    Chief Complaint: Recurrent Nephrolithiasis / Rt Ureteral Stone with Bacteruria and Small Urinoma  HPI:    1 - Recurrent Nephrolithiasis / Rt Ureteral Stone with Bacteruria and Small Urinoma - long h/o recurrent stones managed medically, one episode ureteroscopy years ago presents to ER with 52mm Rt prox ureteral stone, 23mm Rt intrarenal stone and hydronephrosis with small urinoma and bacteruria. No fevers / chills. No leukocytosis. Cr 1.3. UCX obtained and pending. Placed on empiric Cipro.  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 Her PCP is Shelly Chen MD.  Today "Shelly Obrien" is seen for evaluation of above.   Past Medical History:  Diagnosis Date  . Complication of anesthesia    hard to wake   . Coronary artery disease   . H/O: hysterectomy   . Hx of lumpectomy   . Hyperlipidemia   . Hypertension   . Kidney stones   . Myocardial infarct (HCC)    hx of  . PONV (postoperative nausea and vomiting)   . Wears glasses     Past Surgical History:  Procedure Laterality Date  . ABDOMINAL HYSTERECTOMY    . ANGIOPLASTY    . BROW LIFT Right 04/07/2014   Procedure: REPAIR OF ENTROPION AND TRICHIASIS OF RIGHT LOWER EYE LID ;  Surgeon: Wayland Denis, DO;  Location: [facility];  Service: Plastics;  Laterality: Right;  . CARPAL TUNNEL RELEASE     left  . DILATION AND CURETTAGE OF UTERUS    . EYE SURGERY     both cataracts  . KNEE SURGERY     right    Family History  Problem Relation Age of Onset  . Coronary artery disease Unknown        positive family hx of  . Cancer Unknown        family hx of  . Brain cancer Son   . Liver cancer Daughter    Social History:  reports that she has quit smoking. She has never used smokeless tobacco. She reports that she does not drink alcohol or use drugs.  Allergies:  Allergies  Allergen Reactions  . Antihistamines,  Chlorpheniramine-Type     Passed out, hospital  . Penicillins Anaphylaxis and Swelling    Has patient had a PCN reaction causing immediate rash, facial/tongue/throat swelling, SOB or lightheadedness with hypotension: yes Has patient had a PCN reaction causing severe rash involving mucus membranes or skin necrosis:NO Has patient had a PCN reaction that required hospitalization NO Has patient had a PCN reaction occurring within the last 10 years:NO If all of the above answers are "NO", then may proceed with Cephalosporin use.  . Contrast Media [Iodinated Diagnostic Agents] Hives  . Sulfonamide Derivatives Other (See Comments)    Unknown   . Atorvastatin Other (See Comments)  . Pheniramine Other (See Comments)    Passed out, hospital     (Not in a hospital admission)  Results for orders placed or performed during the hospital encounter of 01/20/17 (from the past 48 hour(s))  CBC with Differential/Platelet     Status: Abnormal   Collection Time: 01/20/17  3:24 AM  Result Value Ref Range   WBC 9.9 4.0 - 10.5 K/uL   RBC 4.66 3.87 - 5.11 MIL/uL   Hemoglobin 14.4 12.0 - 15.0 g/dL   HCT 19.4 17.4 - 08.1 %   MCV 95.3 78.0 - 100.0 fL  MCH 30.9 26.0 - 34.0 pg   MCHC 32.4 30.0 - 36.0 g/dL   RDW 14.5 11.5 - 15.5 %   Platelets 192 150 - 400 K/uL   Neutrophils Relative % 80 %   Neutro Abs 8.0 (H) 1.7 - 7.7 K/uL   Lymphocytes Relative 14 %   Lymphs Abs 1.4 0.7 - 4.0 K/uL   Monocytes Relative 5 %   Monocytes Absolute 0.5 0.1 - 1.0 K/uL   Eosinophils Relative 1 %   Eosinophils Absolute 0.1 0.0 - 0.7 K/uL   Basophils Relative 0 %   Basophils Absolute 0.0 0.0 - 0.1 K/uL  Basic metabolic panel     Status: Abnormal   Collection Time: 01/20/17  3:24 AM  Result Value Ref Range   Sodium 139 135 - 145 mmol/L   Potassium 4.1 3.5 - 5.1 mmol/L   Chloride 107 101 - 111 mmol/L   CO2 25 22 - 32 mmol/L   Glucose, Bld 181 (H) 65 - 99 mg/dL   BUN 21 (H) 6 - 20 mg/dL   Creatinine, Ser 1.30 (H) 0.44 -  1.00 mg/dL   Calcium 9.6 8.9 - 10.3 mg/dL   GFR calc non Af Amer 35 (L) >60 mL/min   GFR calc Af Amer 40 (L) >60 mL/min    Comment: (NOTE) The eGFR has been calculated using the CKD EPI equation. This calculation has not been validated in all clinical situations. eGFR's persistently <60 mL/min signify possible Chronic Kidney Disease.    Anion gap 7 5 - 15  Urinalysis, Routine w reflex microscopic- may I&O cath if menses     Status: Abnormal   Collection Time: 01/20/17  4:52 AM  Result Value Ref Range   Color, Urine YELLOW YELLOW   APPearance CLEAR CLEAR   Specific Gravity, Urine 1.012 1.005 - 1.030   pH 6.0 5.0 - 8.0   Glucose, UA 50 (A) NEGATIVE mg/dL   Hgb urine dipstick LARGE (A) NEGATIVE   Bilirubin Urine NEGATIVE NEGATIVE   Ketones, ur 5 (A) NEGATIVE mg/dL   Protein, ur NEGATIVE NEGATIVE mg/dL   Nitrite NEGATIVE NEGATIVE   Leukocytes, UA MODERATE (A) NEGATIVE  Urinalysis, Routine w reflex microscopic     Status: Abnormal   Collection Time: 01/20/17  5:47 AM  Result Value Ref Range   Color, Urine YELLOW YELLOW   APPearance CLEAR CLEAR   Specific Gravity, Urine 1.012 1.005 - 1.030   pH 6.0 5.0 - 8.0   Glucose, UA 50 (A) NEGATIVE mg/dL   Hgb urine dipstick LARGE (A) NEGATIVE   Bilirubin Urine NEGATIVE NEGATIVE   Ketones, ur 5 (A) NEGATIVE mg/dL   Protein, ur NEGATIVE NEGATIVE mg/dL   Nitrite NEGATIVE NEGATIVE   Leukocytes, UA MODERATE (A) NEGATIVE   RBC / HPF TOO NUMEROUS TO COUNT 0 - 5 RBC/hpf   WBC, UA 6-30 0 - 5 WBC/hpf   Bacteria, UA MANY (A) NONE SEEN   Squamous Epithelial / LPF 6-30 (A) NONE SEEN   Mucous PRESENT    Hyaline Casts, UA PRESENT    Ct Renal Stone Study  Result Date: 01/20/2017 CLINICAL DATA:  RIGHT flank pain radiating to abdomen for 4 hours. Nausea. Suspect recurrent stone disease. EXAM: CT ABDOMEN AND PELVIS WITHOUT CONTRAST TECHNIQUE: Multidetector CT imaging of the abdomen and pelvis was performed following the standard protocol without IV  contrast. COMPARISON:  CT abdomen and pelvis January 24, 2016 FINDINGS: LOWER CHEST: Bibasilar atelectasis/scarring. The heart is mildly enlarged. Mild coronary artery calcifications. No  pericardial effusion. HEPATOBILIARY: Numerous subcentimeter gallstones without CT findings of acute cholecystitis. Normal noncontrast CT liver. PANCREAS: 16 mm cystic mass head of the pancreas. No ductal dilatation. SPLEEN: Normal. ADRENALS/URINARY TRACT: Kidneys are orthotopic, demonstrating normal size and morphology. Moderate RIGHT hydroureteronephrosis the level of mid to distal ureter where a 4 mm calculus is present. Moderate volume low-density fluid RIGHT retroperitoneum. Two RIGHT upper pole nephrolithiasis measuring to 3 mm, additional punctate RIGHT greater than LEFT nephrolithiasis. Mild LEFT pelviectasis. Limited assessment for renal masses on this nonenhanced examination. Urinary bladder is partially distended and unremarkable. Normal adrenal glands. STOMACH/BOWEL: Small hiatal hernia. The stomach, small and large bowel are normal in course and caliber without inflammatory changes, sensitivity decreased by lack of enteric contrast. Normal appendix. VASCULAR/LYMPHATIC: Aortoiliac vessels are normal in course and caliber. Moderate calcific atherosclerosis No lymphadenopathy by CT size criteria. REPRODUCTIVE: Status post hysterectomy. OTHER: No intraperitoneal free fluid or free air. Calcified granuloma in the pelvis. MUSCULOSKELETAL: Non-acute. Stable coarse calcification LEFT breast. Osteopenia. Moderate mid lumbar dextroscoliosis in severe degenerative change of the lumbar spine. LEFT gluteal injection granulomas. IMPRESSION: 1. 4 mm mid to distal RIGHT ureteral calculus resulting in moderate obstructive uropathy. Bilateral nephrolithiasis. 2. Moderate RIGHT retroperitoneal free fluid most compatible with caliceal rupture. 3. **An incidental finding of potential clinical significance has been found. 16 mm cystic mass in  head of pancreas. Recommend 2 year follow-up. This recommendation follows ACR consensus guidelines: Management of Incidental Pancreatic Cysts: A White Paper of the ACR Incidental Findings Committee. C[place]bert 7622;[id number]. ** Aortic Atherosclerosis (ICD10-I70.0). Electronically Signed   By: Elon Alas M.D.   On: 01/20/2017 04:13    Review of Systems  Constitutional: Positive for malaise/fatigue. Negative for chills and fever.  HENT: Negative.   Eyes: Negative.   Respiratory: Negative.   Cardiovascular: Negative.   Gastrointestinal: Positive for nausea.  Genitourinary: Positive for flank pain.  Skin: Negative.   Endo/Heme/Allergies: Negative.   Psychiatric/Behavioral: Negative.     Blood pressure (!) 155/94, pulse 78, temperature 97.8 F (36.6 C), temperature source Oral, resp. rate 18, height '5\' 5"'$  (1.651 m), weight 73.9 kg (163 lb), SpO2 93 %. Physical Exam  Constitutional: She is oriented to person, place, and time. She appears well-developed.  HENT:  Head: Normocephalic.  Rt eye redness (chronic)  Cardiovascular: Normal rate.   Respiratory: Effort normal.  GI: Soft.  Genitourinary:  Genitourinary Comments: Mild Rt CVAT  Musculoskeletal: Normal range of motion.  Neurological: She is alert and oriented to person, place, and time.  Skin: Skin is warm.  Psychiatric: She has a normal mood and affect.     Assessment/Plan   1 - Recurrent Nephrolithiasis / Rt Ureteral Stone with Bacteruria and Small Urinoma - pain controlled and no systemic infectious parameters, but urinoma and bacteruria concerning. Rec OR cysto and Rt stent today for renal decompression, IV ABX, and observation overnight to verify no progression to systemic infection followed by elective ureteroscopy with goal of stone free in few weeks. She is in agreement. Risks, benefits, alternatives, expected peri-op course for stent discussed as well as need for staged approach.   Alexis Frock,  MD 01/20/2017, 7:21 AM

## 2017-01-20 NOTE — Anesthesia Preprocedure Evaluation (Addendum)
Anesthesia Evaluation  Patient identified by MRN, date of birth, ID band Patient awake    Reviewed: Allergy & Precautions, NPO status , Patient's Chart, lab work & pertinent test results, reviewed documented beta blocker date and time   History of Anesthesia Complications (+) PONV and history of anesthetic complications  Airway Mallampati: I  TM Distance: >3 FB Neck ROM: Full    Dental no notable dental hx. (+) Dental Advisory Given, Caps, Missing   Pulmonary former smoker,    Pulmonary exam normal breath sounds clear to auscultation       Cardiovascular hypertension, Pt. on medications and Pt. on home beta blockers + CAD and + Past MI  + dysrhythmias Atrial Fibrillation  Rhythm:Irregular Rate:Normal  Echo 02/26/2016: LVEF 55-60% akinesis of the basal-midanteroseptal and inferoseptal myocardium. There is akinesis of the apicalinferior myocardium.  EKG: NSR, PVC, poor R wave progression, LAFB, prolonged QTc, no acute changes  Hx/o MI 2O+ years ago S/P balloon angioplasty- asymptomatic since then   Neuro/Psych Macular degeneration- exudative negative psych ROS   GI/Hepatic Neg liver ROS, Pancreatic Cyst   Endo/Other  Hyperlipidemia  Renal/GU Renal diseaseRight renal calculus  negative genitourinary   Musculoskeletal  (+) Arthritis , Osteoarthritis,  Dexrtroscoliosis Degenerative OA Lumbar spine Blepharoptosis right lower eyelid S/P excision of of skin Ca right lower leg   Abdominal   Peds  Hematology On Xarelto- last dose 8/18   Anesthesia Other Findings   Reproductive/Obstetrics                           Anesthesia Physical Anesthesia Plan  ASA: III  Anesthesia Plan: General   Post-op Pain Management:    Induction: Intravenous  PONV Risk Score and Plan: 4 or greater and Ondansetron, Dexamethasone, Propofol infusion, Treatment may vary due to age or medical condition and  Metaclopromide  Airway Management Planned: Oral ETT and LMA  Additional Equipment:   Intra-op Plan:   Post-operative Plan: Extubation in OR  Informed Consent: I have reviewed the patients History and Physical, chart, labs and discussed the procedure including the risks, benefits and alternatives for the proposed anesthesia with the patient or authorized representative who has indicated his/her understanding and acceptance.   Dental advisory given  Plan Discussed with: CRNA, Anesthesiologist and Surgeon  Anesthesia Plan Comments:        Anesthesia Quick Evaluation

## 2017-01-20 NOTE — Anesthesia Procedure Notes (Signed)
Procedure Name: LMA Insertion Date/Time: 01/20/2017 10:57 AM Performed by: Claudia Desanctis Pre-anesthesia Checklist: Emergency Drugs available, Patient identified, Suction available and Patient being monitored Patient Re-evaluated:Patient Re-evaluated prior to induction Oxygen Delivery Method: Circle system utilized Preoxygenation: Pre-oxygenation with 100% oxygen Induction Type: IV induction Ventilation: Mask ventilation without difficulty LMA: LMA with gastric port inserted LMA Size: 4.0 Number of attempts: 1 Placement Confirmation: positive ETCO2 and breath sounds checked- equal and bilateral Tube secured with: Tape Dental Injury: Teeth and Oropharynx as per pre-operative assessment

## 2017-01-20 NOTE — Progress Notes (Signed)
PHARMACY NOTE:  ANTIMICROBIAL RENAL DOSAGE ADJUSTMENT  Current antimicrobial regimen includes a mismatch between antimicrobial dosage and estimated renal function.  As per policy approved by the Pharmacy & Therapeutics and Medical Executive Committees, the antimicrobial dosage will be adjusted accordingly.  Current antimicrobial dosage:  Cipro 400mg  IV q12  Indication: renal stones, bacteruria  Renal Function:   Estimated Creatinine Clearance: 27.8 mL/min (A) (by C-G formula based on SCr of 1.3 mg/dL (H)). []      On intermittent HD, scheduled: []      On CRRT    Antimicrobial dosage has been changed to:  Cipro 400mg  IV q24   Additional Comments:    Thank you for allowing pharmacy to be a part of this patient's care.  Minda Ditto  01/20/2017 3:19 PM

## 2017-01-20 NOTE — Transfer of Care (Signed)
Immediate Anesthesia Transfer of Care Note  Patient: Shelly Obrien  Procedure(s) Performed: Procedure(s): CYSTOSCOPY WITH RIGHT RETROGRADE PYELOGRAM/RIGHT URETERAL STENT PLACEMENT (Right)  Patient Location: PACU  Anesthesia Type:General  Level of Consciousness:  sedated, patient cooperative and responds to stimulation  Airway & Oxygen Therapy:Patient Spontanous Breathing and Patient connected to face mask oxgen  Post-op Assessment:  Report given to PACU RN and Post -op Vital signs reviewed and stable  Post vital signs:  Reviewed and stable  Last Vitals:  Vitals:   01/20/17 0818 01/20/17 0923  BP:  (!) 158/98  Pulse: 85 98  Resp: 18 18  Temp: (!) 36.4 C 36.4 C  SpO2: 58% 83%    Complications: No apparent anesthesia complications

## 2017-01-20 NOTE — Anesthesia Postprocedure Evaluation (Signed)
Anesthesia Post Note  Patient: Shelly Obrien  Procedure(s) Performed: Procedure(s) (LRB): CYSTOSCOPY WITH RIGHT RETROGRADE PYELOGRAM/RIGHT URETERAL STENT PLACEMENT (Right)     Patient location during evaluation: PACU Anesthesia Type: General Level of consciousness: awake and alert Pain management: pain level controlled Vital Signs Assessment: post-procedure vital signs reviewed and stable Respiratory status: spontaneous breathing, nonlabored ventilation, respiratory function stable and patient connected to nasal cannula oxygen Cardiovascular status: blood pressure returned to baseline and stable Postop Assessment: no signs of nausea or vomiting Anesthetic complications: no    Last Vitals:  Vitals:   01/20/17 1145 01/20/17 1200  BP: 135/83 (!) 144/76  Pulse: 82 80  Resp: 11 12  Temp: 36.6 C (!) 36.2 C  SpO2: 95% 95%    Last Pain:  Vitals:   01/20/17 0928  TempSrc:   PainSc: 4                  [name] A.

## 2017-01-20 NOTE — Op Note (Signed)
NAMEMADOLINE, BHATT NO.:  [id number]  MEDICAL RECORD NO.:  [id number]  LOCATION:                                 FACILITY:  PHYSICIAN:  Alexis Frock, MD     DATE OF BIRTH:  11-Oct-1922  DATE OF PROCEDURE: 01/20/2017                               OPERATIVE REPORT   DIAGNOSIS:  Right ureteral stone with hydronephrosis, urinoma, and bacteriuria.  PROCEDURES: 1. Cystoscopy, right retrograde pyelogram interpretation. 2. Right ureteral stent placement, 5 x 24 Polaris, no tether.  ESTIMATED BLOOD LOSS:  Nil.  COMPLICATIONS:  None.  ASSISTANT:  None.  FINDINGS: 1. Small volume urinary extravasation from kidney consistent with     known urinoma. 2. Successful placement of right ureteral stent, proximal in renal     pelvis and distal in urinary bladder. 3. Mobile filling defect in distal ureter consistent with likely known     stone.  INDICATION:  Shelly Obrien is a very pleasant and quite vigorous 83 year old woman with history of recurrent nephrolithiasis.  She is on workup of colicky right flank pain to have a right ureteral stone with proximal hydronephrosis, but also a surrounding urinoma.  She was also noted to have significant bacteriuria.  She does not have a fever or leukocytosis.  Given the overall constellation of findings, it was felt that stenting would be the safest way to proceed to allow for renal decompression, resolution of urinoma, and bacteriuria before definitive stone management, and she wished to proceed.  Informed consent was obtained and placed in the medical record.  PROCEDURE IN DETAIL:  The patient being Shelly Obrien was verified. Procedure being right ureteral stent placement was confirmed.  Procedure was carried out.  Time-out was performed.  Intravenous antibiotics were administered.  General LMA anesthesia introduced.  The patient was placed into a low lithotomy position.  Sterile field was created by prepping and draping the  patient's vagina, introitus, and proximal thighs using iodine.  Next, cystourethroscopy was performed using a 22- French rigid cystoscope with offset lens.  Inspection of the urinary bladder revealed no diverticula, calcifications, or papillary lesions. The right ureteral orifice was cannulated with a 6-French end-hole catheter and right retrograde pyelogram was obtained.  Right retrograde pyelogram using low iodinated solution was performed and there was mild-to-moderate hydroureteronephrosis to mobile filling defect in distal ureter consistent with likely known stone.  There was some mild extravasation noted from the renal pelvis area consistent with known urinoma.  A 0.038 Zip wire was advanced to the level of the upper pole and a new 5 x 24 Polaris-type stent was placed using cystoscopic and fluoroscopic guidance.  Good proximal and distal deployment were noted.  Efflux urine was seen around into the distal end of the stent.  Bladder was emptied per cystoscope.  Procedure was then terminated.  The patient tolerated the procedure well.  There were no immediate periprocedural complications.  The patient was taken to the postanesthesia care unit in stable condition.          ______________________________ Alexis Frock, MD     TM/MEDQ  D:  01/20/2017  T:  01/20/2017  Job:  [id number]

## 2017-01-20 NOTE — Op Note (Deleted)
  The note originally documented on this encounter has been moved the the encounter in which it belongs.  

## 2017-01-20 NOTE — ED Notes (Signed)
Bed: WA21 Expected date:  Expected time:  Means of arrival:  Comments: 63f flank pain

## 2017-01-20 NOTE — ED Provider Notes (Signed)
[name] Provider Note: Shelly Spurling, MD, FACEP  CSN: [id number] MRN: [id number] ARRIVAL: 01/20/17 at [place]  Flank Pain   HISTORY OF PRESENT ILLNESS  01/20/17 3:00 AM Shelly Obrien is a 83 y.o. female with a history of nephrolithiasis. She is here with right flank pain radiating to her right lower quadrant that began about 4 hours ago. The onset was sudden. Pain is similar to previous ureteral stones. She has nausea has been retching. Her pain is moderate to severe. It is somewhat worse with movement or palpation.   She recently had surgery on her right lower leg to remove skin cancer.  Past Medical History:  Diagnosis Date  . Complication of anesthesia    hard to wake   . Coronary artery disease   . H/O: hysterectomy   . Hx of lumpectomy   . Hyperlipidemia   . Hypertension   . Kidney stones   . Myocardial infarct (HCC)    hx of  . PONV (postoperative nausea and vomiting)   . Wears glasses     Past Surgical History:  Procedure Laterality Date  . ABDOMINAL HYSTERECTOMY    . ANGIOPLASTY    . BROW LIFT Right 04/07/2014   Procedure: REPAIR OF ENTROPION AND TRICHIASIS OF RIGHT LOWER EYE LID ;  Surgeon: Theodoro Kos, DO;  Location: [facility];  Service: Plastics;  Laterality: Right;  . CARPAL TUNNEL RELEASE     left  . DILATION AND CURETTAGE OF UTERUS    . EYE SURGERY     both cataracts  . KNEE SURGERY     right    Family History  Problem Relation Age of Onset  . Coronary artery disease Unknown        positive family hx of  . Cancer Unknown        family hx of  . Brain cancer Son   . Liver cancer Daughter     Social History  Substance Use Topics  . Smoking status: Former Research scientist (life sciences)  . Smokeless tobacco: Never Used     Comment: Tried as s a teen  . Alcohol use No    Prior to Admission medications   Medication Sig Start Date End Date Taking? Authorizing Provider  amLODipine (NORVASC) 2.5 MG tablet  Take 0.5 tablets (1.25 mg total) by mouth daily. 05/14/16 10/12/16  Fay Records, MD  amLODipine (NORVASC) 2.5 MG tablet TK 1 T PO QD 11/09/16   [provider]  metoprolol tartrate (LOPRESSOR) 25 MG tablet Take 1 tablet (25 mg total) by mouth 2 (two) times daily. 02/27/16   Ghimire, Henreitta Leber, MD  Multiple Vitamins-Minerals (ICAPS) CAPS Take 1 capsule by mouth every morning.     [provider]  Rivaroxaban (XARELTO) 15 MG TABS tablet Take 1 tablet (15 mg total) by mouth daily with supper. 10/15/16   Fay Records, MD  triamcinolone (NASACORT ALLERGY 24HR) 55 MCG/ACT AERO nasal inhaler Place 2 sprays into the nose daily as needed (allergies).    [provider]    Allergies Antihistamines, chlorpheniramine-type; Penicillins; Contrast media [iodinated diagnostic agents]; Sulfonamide derivatives; Atorvastatin; and Pheniramine   REVIEW OF SYSTEMS  Negative except as noted here or in the History of Present Illness.   PHYSICAL EXAMINATION  Initial Vital Signs Height 5\' 5"  (1.651 m), weight 73.9 kg (163 lb).  Examination General: Well-developed, well-nourished female in no acute distress; appearance consistent with age of record HENT: normocephalic; atraumatic  Eyes: pupils equal, round and reactive to light; extraocular muscles intact; ectropion of right lower lid Neck: supple Heart: regular rate and rhythm Lungs: clear to auscultation bilaterally Abdomen: soft; nondistended; right lower quadrant tenderness; bowel sounds present GU: Right CVA tenderness Extremities: No deformity; full range of motion; pulses normal; right lower leg and foot bandaged Neurologic: Awake, alert and oriented; motor function intact in all extremities and symmetric; no facial droop Skin: Warm and dry Psychiatric: flat affect   RESULTS  Summary of this visit's results, reviewed by myself:   EKG Interpretation  Date/Time:    Ventricular Rate:    PR Interval:    QRS Duration:   QT  Interval:    QTC Calculation:   R Axis:     Text Interpretation:        Laboratory Studies: Results for orders placed or performed during the hospital encounter of 01/20/17 (from the past 24 hour(s))  CBC with Differential/Platelet     Status: Abnormal   Collection Time: 01/20/17  3:24 AM  Result Value Ref Range   WBC 9.9 4.0 - 10.5 K/uL   RBC 4.66 3.87 - 5.11 MIL/uL   Hemoglobin 14.4 12.0 - 15.0 g/dL   HCT 44.4 36.0 - 46.0 %   MCV 95.3 78.0 - 100.0 fL   MCH 30.9 26.0 - 34.0 pg   MCHC 32.4 30.0 - 36.0 g/dL   RDW 14.5 11.5 - 15.5 %   Platelets 192 150 - 400 K/uL   Neutrophils Relative % 80 %   Neutro Abs 8.0 (H) 1.7 - 7.7 K/uL   Lymphocytes Relative 14 %   Lymphs Abs 1.4 0.7 - 4.0 K/uL   Monocytes Relative 5 %   Monocytes Absolute 0.5 0.1 - 1.0 K/uL   Eosinophils Relative 1 %   Eosinophils Absolute 0.1 0.0 - 0.7 K/uL   Basophils Relative 0 %   Basophils Absolute 0.0 0.0 - 0.1 K/uL  Basic metabolic panel     Status: Abnormal   Collection Time: 01/20/17  3:24 AM  Result Value Ref Range   Sodium 139 135 - 145 mmol/L   Potassium 4.1 3.5 - 5.1 mmol/L   Chloride 107 101 - 111 mmol/L   CO2 25 22 - 32 mmol/L   Glucose, Bld 181 (H) 65 - 99 mg/dL   BUN 21 (H) 6 - 20 mg/dL   Creatinine, Ser 1.30 (H) 0.44 - 1.00 mg/dL   Calcium 9.6 8.9 - 10.3 mg/dL   GFR calc non Af Amer 35 (L) >60 mL/min   GFR calc Af Amer 40 (L) >60 mL/min   Anion gap 7 5 - 15  Urinalysis, Routine w reflex microscopic- may I&O cath if menses     Status: Abnormal   Collection Time: 01/20/17  4:52 AM  Result Value Ref Range   Color, Urine YELLOW YELLOW   APPearance CLEAR CLEAR   Specific Gravity, Urine 1.012 1.005 - 1.030   pH 6.0 5.0 - 8.0   Glucose, UA 50 (A) NEGATIVE mg/dL   Hgb urine dipstick LARGE (A) NEGATIVE   Bilirubin Urine NEGATIVE NEGATIVE   Ketones, ur 5 (A) NEGATIVE mg/dL   Protein, ur NEGATIVE NEGATIVE mg/dL   Nitrite NEGATIVE NEGATIVE   Leukocytes, UA MODERATE (A) NEGATIVE  Urinalysis,  Routine w reflex microscopic     Status: Abnormal   Collection Time: 01/20/17  5:47 AM  Result Value Ref Range   Color, Urine YELLOW YELLOW   APPearance CLEAR CLEAR   Specific Gravity, Urine  1.012 1.005 - 1.030   pH 6.0 5.0 - 8.0   Glucose, UA 50 (A) NEGATIVE mg/dL   Hgb urine dipstick LARGE (A) NEGATIVE   Bilirubin Urine NEGATIVE NEGATIVE   Ketones, ur 5 (A) NEGATIVE mg/dL   Protein, ur NEGATIVE NEGATIVE mg/dL   Nitrite NEGATIVE NEGATIVE   Leukocytes, UA MODERATE (A) NEGATIVE   RBC / HPF TOO NUMEROUS TO COUNT 0 - 5 RBC/hpf   WBC, UA 6-30 0 - 5 WBC/hpf   Bacteria, UA MANY (A) NONE SEEN   Squamous Epithelial / LPF 6-30 (A) NONE SEEN   Mucous PRESENT    Hyaline Casts, UA PRESENT    Imaging Studies: Ct Renal Stone Study  Result Date: 01/20/2017 CLINICAL DATA:  RIGHT flank pain radiating to abdomen for 4 hours. Nausea. Suspect recurrent stone disease. EXAM: CT ABDOMEN AND PELVIS WITHOUT CONTRAST TECHNIQUE: Multidetector CT imaging of the abdomen and pelvis was performed following the standard protocol without IV contrast. COMPARISON:  CT abdomen and pelvis January 24, 2016 FINDINGS: LOWER CHEST: Bibasilar atelectasis/scarring. The heart is mildly enlarged. Mild coronary artery calcifications. No pericardial effusion. HEPATOBILIARY: Numerous subcentimeter gallstones without CT findings of acute cholecystitis. Normal noncontrast CT liver. PANCREAS: 16 mm cystic mass head of the pancreas. No ductal dilatation. SPLEEN: Normal. ADRENALS/URINARY TRACT: Kidneys are orthotopic, demonstrating normal size and morphology. Moderate RIGHT hydroureteronephrosis the level of mid to distal ureter where a 4 mm calculus is present. Moderate volume low-density fluid RIGHT retroperitoneum. Two RIGHT upper pole nephrolithiasis measuring to 3 mm, additional punctate RIGHT greater than LEFT nephrolithiasis. Mild LEFT pelviectasis. Limited assessment for renal masses on this nonenhanced examination. Urinary bladder is  partially distended and unremarkable. Normal adrenal glands. STOMACH/BOWEL: Small hiatal hernia. The stomach, small and large bowel are normal in course and caliber without inflammatory changes, sensitivity decreased by lack of enteric contrast. Normal appendix. VASCULAR/LYMPHATIC: Aortoiliac vessels are normal in course and caliber. Moderate calcific atherosclerosis No lymphadenopathy by CT size criteria. REPRODUCTIVE: Status post hysterectomy. OTHER: No intraperitoneal free fluid or free air. Calcified granuloma in the pelvis. MUSCULOSKELETAL: Non-acute. Stable coarse calcification LEFT breast. Osteopenia. Moderate mid lumbar dextroscoliosis in severe degenerative change of the lumbar spine. LEFT gluteal injection granulomas. IMPRESSION: 1. 4 mm mid to distal RIGHT ureteral calculus resulting in moderate obstructive uropathy. Bilateral nephrolithiasis. 2. Moderate RIGHT retroperitoneal free fluid most compatible with caliceal rupture. 3. **An incidental finding of potential clinical significance has been found. 16 mm cystic mass in head of pancreas. Recommend 2 year follow-up. This recommendation follows ACR consensus guidelines: Management of Incidental Pancreatic Cysts: A White Paper of the ACR Incidental Findings Committee. [place] [id number]. ** Aortic Atherosclerosis (ICD10-I70.0). Electronically Signed   By: Elon Alas M.D.   On: 01/20/2017 04:13    ED COURSE  Nursing notes and initial vitals signs, including pulse oximetry, reviewed.  Vitals:   01/20/17 0332 01/20/17 0415 01/20/17 0500 01/20/17 0545  BP: (!) 167/83 (!) 141/78 (!) 162/91 (!) 148/86  Pulse: 77 68 83 73  Resp: 16 16 16 18   Temp: 97.8 F (36.6 C)     TempSrc: Oral     SpO2: 93% 92% 93% 91%  Weight:      Height:       6:10 AM Urine is equivocal for infection and has been sent for culture.   6:39 AM Dr. Tresa Moore to see patient in the ED.  PROCEDURES    ED DIAGNOSES     ICD-10-CM   1.  Ureterolithiasis  N20.1   2. Urinary extravasation R39.0        Lanier Millon, Jenny Reichmann, MD 01/20/17 [phone]

## 2017-01-21 ENCOUNTER — Encounter (HOSPITAL_COMMUNITY): Payer: Self-pay | Admitting: General Practice

## 2017-01-21 ENCOUNTER — Other Ambulatory Visit: Payer: Self-pay | Admitting: Urology

## 2017-01-21 DIAGNOSIS — N132 Hydronephrosis with renal and ureteral calculous obstruction: Secondary | ICD-10-CM | POA: Diagnosis not present

## 2017-01-21 DIAGNOSIS — N368 Other specified disorders of urethra: Secondary | ICD-10-CM | POA: Diagnosis not present

## 2017-01-21 DIAGNOSIS — N202 Calculus of kidney with calculus of ureter: Secondary | ICD-10-CM | POA: Diagnosis not present

## 2017-01-21 DIAGNOSIS — R8271 Bacteriuria: Secondary | ICD-10-CM | POA: Diagnosis not present

## 2017-01-21 LAB — URINE CULTURE

## 2017-01-21 MED ORDER — TRAMADOL HCL 50 MG PO TABS: 50.0000 mg | ORAL_TABLET | Freq: Four times a day (QID) | ORAL | 0 refills | Status: DC | PRN

## 2017-01-21 MED ORDER — CIPROFLOXACIN HCL 500 MG PO TABS: 500.0000 mg | ORAL_TABLET | Freq: Two times a day (BID) | ORAL | 0 refills | Status: DC

## 2017-01-21 NOTE — Care Management Note (Signed)
Case Management Note  Patient Details  Name: Shelly Obrien MRN: [id number] Date of Birth: [birth year]  Subjective/Objective: 83 y/o f admitted w/ureteral stone w/hydronephrosis. From home.                   Action/Plan:d/c plan home.   Expected Discharge Date:  01/21/17               Expected Discharge Plan:  Home/Self Care  In-House Referral:     Discharge planning Services  CM Consult  Post Acute Care Choice:    Choice offered to:     DME Arranged:    DME Agency:     HH Arranged:    HH Agency:     Status of Service:  Completed, signed off  If discussed at [employer] of Stay Meetings, dates discussed:    Additional Comments:  Dessa Phi, RN 01/21/2017, 10:23 AM

## 2017-01-21 NOTE — Discharge Instructions (Signed)
1 - You may have urinary urgency (bladder spasms) and bloody urine on / off with stent in place. This is normal. ° °2 - Call MD or go to ER for fever >102, severe pain / nausea / vomiting not relieved by medications, or acute change in medical status ° °

## 2017-01-21 NOTE — Discharge Summary (Signed)
Physician Discharge Summary  Patient ID: Shelly Obrien MRN: [id number] DOB/AGE: 81-Jun-1924 81 y.o.  Admit date: 01/20/2017 Discharge date: 01/21/2017  Admission Diagnoses: Right Ureteral Stone with Urinoma  Discharge Diagnoses:  Active Problems:   Ureteral stone with hydronephrosis   Discharged Condition: good  Hospital Course: Pt underwent urgent cystoscopy with RIGHT ureteral stent placement on 8/19 for right ureteral stone with refractory colic + small urinoma + bacteruria. She was placed on IV Cipro and observed overnight. By the AM of POD 1 she is ambulatory, afebrile, pain nearly completely resolved, and felt to be adequate for discharge. She will have stone managed in elective setting with ureteroscopy in few weeks.   Consults: None  Significant Diagnostic Studies: labs: as per above  Treatments: surgery: right ureteral stent placement 01/20/17  Discharge Exam: Blood pressure (!) 142/90, pulse 79, temperature 98 F (36.7 C), temperature source Oral, resp. rate 18, height 5\' 5"  (1.651 m), weight 73.8 kg (162 lb 11.2 oz), SpO2 92 %. General appearance: alert and cooperative Eyes: stable Rt eye redness Nose: Nares normal. Septum midline. Mucosa normal. No drainage or sinus tenderness. Throat: lips, mucosa, and tongue normal; teeth and gums normal Neck: supple, symmetrical, trachea midline Back: symmetric, no curvature. ROM normal. No CVA tenderness. Resp: non-labroed on room air.  Cardio: Nl rate GI: soft, non-tender; bowel sounds normal; no masses,  no organomegaly Pelvic: external genitalia normal Extremities: extremities normal, atraumatic, no cyanosis or edema Pulses: 2+ and symmetric Skin: Skin color, texture, turgor normal. No rashes or lesions Neurologic: Grossly normal  Disposition: 01-Home or Self Care   Allergies as of 01/21/2017      Reactions   Antihistamines, Chlorpheniramine-type Other (See Comments)   Pt states that she passed out.    Penicillins  Anaphylaxis, Other (See Comments)   Has patient had a PCN reaction causing immediate rash, facial/tongue/throat swelling, SOB or lightheadedness with hypotension: Yes Has patient had a PCN reaction causing severe rash involving mucus membranes or skin necrosis: No Has patient had a PCN reaction that required hospitalization No Has patient had a PCN reaction occurring within the last 10 years: No If all of the above answers are "NO", then may proceed with Cephalosporin use.   Contrast Media [iodinated Diagnostic Agents] Hives   Sulfonamide Derivatives Other (See Comments)   Reaction:  Unknown    Atorvastatin Other (See Comments)   Reaction:  Unknown    Pheniramine Other (See Comments)   Pt states that she passed out.       Medication List    TAKE these medications   amLODipine 2.5 MG tablet Commonly known as:  NORVASC Take 2.5 mg by mouth daily.   ciprofloxacin 500 MG tablet Commonly known as:  CIPRO Take 1 tablet (500 mg total) by mouth 2 (two) times daily. X 3 days now. Also begin 2 days before next Urology surgery.   ICAPS AREDS 2 Caps Take 1 capsule by mouth daily.   metoprolol tartrate 25 MG tablet Commonly known as:  LOPRESSOR Take 1 tablet (25 mg total) by mouth 2 (two) times daily. What changed:  when to take this   NASACORT ALLERGY 24HR 55 MCG/ACT Aero nasal inhaler Generic drug:  triamcinolone Place 1-2 sprays into the nose 2 (two) times daily as needed (for rhinitis).   Rivaroxaban 15 MG Tabs tablet Commonly known as:  XARELTO Take 1 tablet (15 mg total) by mouth daily with supper.   traMADol 50 MG tablet Commonly known as:  ULTRAM Take 1  tablet (50 mg total) by mouth every 6 (six) hours as needed for moderate pain or severe pain. Post-operatively.        SignedAlexis Frock 01/21/2017, 6:14 AM

## 2017-01-26 ENCOUNTER — Ambulatory Visit
Admission: RE | Admit: 2017-01-26 | Discharge: 2017-01-26 | Disposition: A | Discharge status: Home / Self Care | Payer: Medicare Other | Source: Ambulatory Visit | Attending: Neurology | Admitting: Neurology

## 2017-01-26 DIAGNOSIS — R200 Anesthesia of skin: Secondary | ICD-10-CM

## 2017-01-26 DIAGNOSIS — R42 Dizziness and giddiness: Secondary | ICD-10-CM | POA: Diagnosis not present

## 2017-01-26 DIAGNOSIS — M542 Cervicalgia: Secondary | ICD-10-CM

## 2017-01-26 DIAGNOSIS — R29898 Other symptoms and signs involving the musculoskeletal system: Secondary | ICD-10-CM

## 2017-01-29 ENCOUNTER — Ambulatory Visit
Admission: RE | Admit: 2017-01-29 | Discharge: 2017-01-29 | Disposition: A | Discharge status: Home / Self Care | Payer: Medicare Other | Source: Ambulatory Visit | Attending: Neurology | Admitting: Neurology

## 2017-01-29 ENCOUNTER — Telehealth: Payer: Self-pay | Admitting: *Deleted

## 2017-01-29 DIAGNOSIS — M5412 Radiculopathy, cervical region: Secondary | ICD-10-CM

## 2017-01-29 DIAGNOSIS — G8929 Other chronic pain: Secondary | ICD-10-CM

## 2017-01-29 DIAGNOSIS — M79602 Pain in left arm: Secondary | ICD-10-CM

## 2017-01-29 DIAGNOSIS — M542 Cervicalgia: Secondary | ICD-10-CM

## 2017-01-29 DIAGNOSIS — R202 Paresthesia of skin: Secondary | ICD-10-CM

## 2017-01-29 DIAGNOSIS — R29898 Other symptoms and signs involving the musculoskeletal system: Secondary | ICD-10-CM

## 2017-01-29 DIAGNOSIS — M47812 Spondylosis without myelopathy or radiculopathy, cervical region: Secondary | ICD-10-CM | POA: Diagnosis not present

## 2017-01-29 DIAGNOSIS — R42 Dizziness and giddiness: Secondary | ICD-10-CM

## 2017-01-29 NOTE — Telephone Encounter (Signed)
-----   Message from Melvenia Beam, MD sent at 01/28/2017  4:25 PM EDT ----- No strokes to explain symptoms. There is some volume loss of the brain which can be normal at the age of 68. We need to wait for the results of the MRi of the cervical spine next, thanks

## 2017-01-29 NOTE — Telephone Encounter (Signed)
LMOM (identified vm) with below mri results.  They do not need to return this call unless they have questions/fim

## 2017-01-31 DIAGNOSIS — H353211 Exudative age-related macular degeneration, right eye, with active choroidal neovascularization: Secondary | ICD-10-CM | POA: Diagnosis not present

## 2017-01-31 DIAGNOSIS — H35362 Drusen (degenerative) of macula, left eye: Secondary | ICD-10-CM | POA: Diagnosis not present

## 2017-01-31 DIAGNOSIS — H353231 Exudative age-related macular degeneration, bilateral, with active choroidal neovascularization: Secondary | ICD-10-CM | POA: Diagnosis not present

## 2017-02-01 ENCOUNTER — Telehealth: Payer: Self-pay | Admitting: Neurology

## 2017-02-01 DIAGNOSIS — G542 Cervical root disorders, not elsewhere classified: Secondary | ICD-10-CM

## 2017-02-01 NOTE — Telephone Encounter (Signed)
Tried calling, left message. Will try back.

## 2017-02-05 DIAGNOSIS — R310 Gross hematuria: Secondary | ICD-10-CM | POA: Diagnosis not present

## 2017-02-05 NOTE — Telephone Encounter (Signed)
I called pt. 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 Dr. Jaynee Eagles recommends either a [facility] referral to discuss surgery or an Ortho referral to discuss shots in the neck. Pt says she would prefer the shots in her neck and sees Dr. Theda Sers at [facility]. Pt says that she is scheduled for surgery for her kidney stones later this month. Will send referral to orthopaedics per pt request. Pt verbalized understanding of results. Pt had no questions at this time but was encouraged to call back if questions arise.

## 2017-02-05 NOTE — Addendum Note (Signed)
Addended by: Lester Trimble A on: 02/05/2017 03:12 PM   Modules accepted: Orders

## 2017-02-05 NOTE — Telephone Encounter (Signed)
Shelly Obrien, Patient's MRi brain was unremarkable for age. But her MRi cervical spine showed arthritis and pinched nerves that is likely causing her pain (see below). We can send her to [facility] or Orthopaedics for evaluation of shots in the neck or surgical options please discuss with them. I left a few messages  MRI scan of cervical spine showing prominent spondylitic changes mostly at C3-4 and C5-6 where there is disc osteophyte protrusion along with ligamentum flavum hypertrophy resulting in mild canal and moderate bilateral foraminal narrowing with likely impingement on the exiting nerve roots.

## 2017-02-11 ENCOUNTER — Encounter (HOSPITAL_BASED_OUTPATIENT_CLINIC_OR_DEPARTMENT_OTHER): Payer: Self-pay | Admitting: *Deleted

## 2017-02-12 ENCOUNTER — Encounter (HOSPITAL_BASED_OUTPATIENT_CLINIC_OR_DEPARTMENT_OTHER): Payer: Self-pay | Admitting: *Deleted

## 2017-02-12 NOTE — Progress Notes (Signed)
To [facility] at 1300-Istat 8 on arrival.Npo solids after Mn-clear liquids(no pulp,dairy) until 0800,then Npo.Will take norvasc,metoprolol in am.Message received from [facility] from [facility] pt to remain on xarelto per Dr Tresa Moore.Spoke with [employer], to give pre op instructions.

## 2017-02-15 ENCOUNTER — Encounter (HOSPITAL_BASED_OUTPATIENT_CLINIC_OR_DEPARTMENT_OTHER)
Admission: RE | Disposition: A | Discharge status: Home / Self Care | Payer: Self-pay | Source: Ambulatory Visit | Attending: Urology

## 2017-02-15 ENCOUNTER — Encounter (HOSPITAL_BASED_OUTPATIENT_CLINIC_OR_DEPARTMENT_OTHER): Payer: Self-pay | Admitting: *Deleted

## 2017-02-15 ENCOUNTER — Ambulatory Visit (HOSPITAL_BASED_OUTPATIENT_CLINIC_OR_DEPARTMENT_OTHER): Payer: Medicare Other | Admitting: Anesthesiology

## 2017-02-15 ENCOUNTER — Ambulatory Visit (HOSPITAL_BASED_OUTPATIENT_CLINIC_OR_DEPARTMENT_OTHER)
Admission: RE | Admit: 2017-02-15 | Discharge: 2017-02-15 | Disposition: A | Discharge status: Home / Self Care | Payer: Medicare Other | Source: Ambulatory Visit | Attending: Urology | Admitting: Urology

## 2017-02-15 DIAGNOSIS — I4891 Unspecified atrial fibrillation: Secondary | ICD-10-CM | POA: Diagnosis not present

## 2017-02-15 DIAGNOSIS — I480 Paroxysmal atrial fibrillation: Secondary | ICD-10-CM | POA: Insufficient documentation

## 2017-02-15 DIAGNOSIS — I129 Hypertensive chronic kidney disease with stage 1 through stage 4 chronic kidney disease, or unspecified chronic kidney disease: Secondary | ICD-10-CM | POA: Insufficient documentation

## 2017-02-15 DIAGNOSIS — N183 Chronic kidney disease, stage 3 (moderate): Secondary | ICD-10-CM | POA: Insufficient documentation

## 2017-02-15 DIAGNOSIS — I2510 Atherosclerotic heart disease of native coronary artery without angina pectoris: Secondary | ICD-10-CM | POA: Insufficient documentation

## 2017-02-15 DIAGNOSIS — Z7901 Long term (current) use of anticoagulants: Secondary | ICD-10-CM | POA: Diagnosis not present

## 2017-02-15 DIAGNOSIS — Z85828 Personal history of other malignant neoplasm of skin: Secondary | ICD-10-CM | POA: Diagnosis not present

## 2017-02-15 DIAGNOSIS — I252 Old myocardial infarction: Secondary | ICD-10-CM | POA: Insufficient documentation

## 2017-02-15 DIAGNOSIS — N132 Hydronephrosis with renal and ureteral calculous obstruction: Secondary | ICD-10-CM | POA: Insufficient documentation

## 2017-02-15 DIAGNOSIS — N202 Calculus of kidney with calculus of ureter: Secondary | ICD-10-CM | POA: Diagnosis not present

## 2017-02-15 DIAGNOSIS — Z9861 Coronary angioplasty status: Secondary | ICD-10-CM | POA: Insufficient documentation

## 2017-02-15 HISTORY — DX: Unspecified macular degeneration: H35.30

## 2017-02-15 HISTORY — DX: Personal history of malignant neoplasm, unspecified: Z98.890

## 2017-02-15 HISTORY — DX: Personal history of urinary calculi: Z87.442

## 2017-02-15 HISTORY — DX: Coronary angioplasty status: Z98.61

## 2017-02-15 HISTORY — DX: Personal history of malignant neoplasm, unspecified: Z85.9

## 2017-02-15 HISTORY — DX: Personal history of other malignant neoplasm of skin: Z85.828

## 2017-02-15 HISTORY — DX: Chronic kidney disease, stage 3 (moderate): N18.3

## 2017-02-15 HISTORY — DX: Atherosclerotic heart disease of native coronary artery without angina pectoris: I25.10

## 2017-02-15 HISTORY — PX: CYSTOSCOPY WITH RETROGRADE PYELOGRAM, URETEROSCOPY AND STENT PLACEMENT: SHX5789

## 2017-02-15 HISTORY — DX: Chronic kidney disease, stage 3 unspecified: N18.30

## 2017-02-15 HISTORY — PX: HOLMIUM LASER APPLICATION: SHX5852

## 2017-02-15 HISTORY — DX: Paroxysmal atrial fibrillation: I48.0

## 2017-02-15 HISTORY — DX: Old myocardial infarction: I25.2

## 2017-02-15 HISTORY — DX: Calculus of ureter: N20.1

## 2017-02-15 LAB — POCT I-STAT, CHEM 8
BUN: 13 mg/dL (ref 6–20)
Calcium, Ion: 1.33 mmol/L (ref 1.15–1.40)
Chloride: 108 mmol/L (ref 101–111)
Creatinine, Ser: 1 mg/dL (ref 0.44–1.00)
Glucose, Bld: 114 mg/dL — ABNORMAL HIGH (ref 65–99)
HCT: 33 % — ABNORMAL LOW (ref 36.0–46.0)
Hemoglobin: 11.2 g/dL — ABNORMAL LOW (ref 12.0–15.0)
Potassium: 4 mmol/L (ref 3.5–5.1)
Sodium: 145 mmol/L (ref 135–145)
TCO2: 24 mmol/L (ref 22–32)

## 2017-02-15 SURGERY — CYSTOURETEROSCOPY, WITH RETROGRADE PYELOGRAM AND STENT INSERTION
Anesthesia: General | Site: Renal | Laterality: Right

## 2017-02-15 MED ORDER — DEXTROSE 5 % IV SOLN
5.0000 mg/kg | INTRAVENOUS | Status: DC
  Filled 2017-02-15: qty 9.25

## 2017-02-15 MED ORDER — SODIUM CHLORIDE 0.9 % IR SOLN
Status: DC | PRN
Start: 2017-02-15 — End: 2017-02-15
  Administered 2017-02-15: 4000 mL

## 2017-02-15 MED ORDER — FENTANYL CITRATE (PF) 100 MCG/2ML IJ SOLN
INTRAMUSCULAR | Status: AC
  Filled 2017-02-15: qty 2

## 2017-02-15 MED ORDER — PHENYLEPHRINE 40 MCG/ML (10ML) SYRINGE FOR IV PUSH (FOR BLOOD PRESSURE SUPPORT)
PREFILLED_SYRINGE | INTRAVENOUS | Status: AC
Start: 2017-02-15 — End: 2017-02-15
  Filled 2017-02-15: qty 10

## 2017-02-15 MED ORDER — LACTATED RINGERS IV SOLN
INTRAVENOUS | Status: DC
  Administered 2017-02-15: 14:00:00 via INTRAVENOUS
  Filled 2017-02-15: qty 1000

## 2017-02-15 MED ORDER — LIDOCAINE 2% (20 MG/ML) 5 ML SYRINGE
INTRAMUSCULAR | Status: AC
Start: 2017-02-15 — End: 2017-02-15
  Filled 2017-02-15: qty 5

## 2017-02-15 MED ORDER — KETOROLAC TROMETHAMINE 15 MG/ML IJ SOLN
INTRAMUSCULAR | Status: DC | PRN
  Administered 2017-02-15: 15 mg via INTRAVENOUS

## 2017-02-15 MED ORDER — PHENYLEPHRINE HCL 10 MG/ML IJ SOLN
INTRAMUSCULAR | Status: DC | PRN
  Administered 2017-02-15 (×2): 120 ug via INTRAVENOUS

## 2017-02-15 MED ORDER — MIDAZOLAM HCL 2 MG/2ML IJ SOLN
INTRAMUSCULAR | Status: AC
  Filled 2017-02-15: qty 2

## 2017-02-15 MED ORDER — IOHEXOL 300 MG/ML  SOLN
INTRAMUSCULAR | Status: DC | PRN
  Administered 2017-02-15: 12 mL

## 2017-02-15 MED ORDER — PROMETHAZINE HCL 25 MG/ML IJ SOLN
6.2500 mg | INTRAMUSCULAR | Status: DC | PRN
  Filled 2017-02-15: qty 1

## 2017-02-15 MED ORDER — ONDANSETRON HCL 4 MG/2ML IJ SOLN
INTRAMUSCULAR | Status: AC
  Filled 2017-02-15: qty 2

## 2017-02-15 MED ORDER — PROPOFOL 10 MG/ML IV BOLUS
INTRAVENOUS | Status: DC | PRN
  Administered 2017-02-15 (×2): 100 mg via INTRAVENOUS

## 2017-02-15 MED ORDER — FENTANYL CITRATE (PF) 100 MCG/2ML IJ SOLN
25.0000 ug | INTRAMUSCULAR | Status: DC | PRN
  Filled 2017-02-15: qty 1

## 2017-02-15 MED ORDER — PROPOFOL 500 MG/50ML IV EMUL
INTRAVENOUS | Status: AC
  Filled 2017-02-15: qty 50

## 2017-02-15 MED ORDER — DEXAMETHASONE SODIUM PHOSPHATE 10 MG/ML IJ SOLN
INTRAMUSCULAR | Status: AC
  Filled 2017-02-15: qty 1

## 2017-02-15 MED ORDER — GENTAMICIN SULFATE 40 MG/ML IJ SOLN
5.0000 mg/kg | INTRAMUSCULAR | Status: AC
  Administered 2017-02-15: 320 mg via INTRAVENOUS
  Filled 2017-02-15: qty 8

## 2017-02-15 MED ORDER — DEXAMETHASONE SODIUM PHOSPHATE 10 MG/ML IJ SOLN
INTRAMUSCULAR | Status: DC | PRN
  Administered 2017-02-15: 10 mg via INTRAVENOUS

## 2017-02-15 MED ORDER — PROPOFOL 10 MG/ML IV BOLUS
INTRAVENOUS | Status: AC
  Filled 2017-02-15: qty 20

## 2017-02-15 MED ORDER — LIDOCAINE 2% (20 MG/ML) 5 ML SYRINGE
INTRAMUSCULAR | Status: DC | PRN
  Administered 2017-02-15: 60 mg via INTRAVENOUS

## 2017-02-15 MED ORDER — NITROFURANTOIN MONOHYD MACRO 100 MG PO CAPS: 100.0000 mg | ORAL_CAPSULE | Freq: Two times a day (BID) | ORAL | 0 refills | Status: AC

## 2017-02-15 MED ORDER — TRAMADOL HCL 50 MG PO TABS: 50.0000 mg | ORAL_TABLET | Freq: Four times a day (QID) | ORAL | 0 refills | Status: DC | PRN

## 2017-02-15 MED ORDER — ONDANSETRON HCL 4 MG/2ML IJ SOLN
INTRAMUSCULAR | Status: DC | PRN
  Administered 2017-02-15: 4 mg via INTRAVENOUS

## 2017-02-15 SURGICAL SUPPLY — 27 items
BAG DRAIN URO-CYSTO SKYTR STRL (DRAIN) ×2 IMPLANT
BAG DRN UROCATH (DRAIN) ×1
BASKET LASER NITINOL 1.9FR (BASKET) ×1 IMPLANT
BSKT STON RTRVL 120 1.9FR (BASKET) ×1
CATH INTERMIT  6FR 70CM (CATHETERS) ×1 IMPLANT
CLOTH BEACON ORANGE TIMEOUT ST (SAFETY) ×2 IMPLANT
FIBER LASER FLEXIVA 365 (UROLOGICAL SUPPLIES) IMPLANT
FIBER LASER TRAC TIP (UROLOGICAL SUPPLIES) ×1 IMPLANT
GLOVE BIO SURGEON STRL SZ7.5 (GLOVE) ×2 IMPLANT
GLOVE BIOGEL PI IND STRL 7.5 (GLOVE) IMPLANT
GLOVE BIOGEL PI INDICATOR 7.5 (GLOVE) ×2
GOWN STRL REUS W/TWL LRG LVL3 (GOWN DISPOSABLE) ×3 IMPLANT
GUIDEWIRE ANG ZIPWIRE 038X150 (WIRE) ×2 IMPLANT
GUIDEWIRE STR DUAL SENSOR (WIRE) ×2 IMPLANT
INFUSOR MANOMETER BAG 3000ML (MISCELLANEOUS) ×2 IMPLANT
IV NS 1000ML (IV SOLUTION) ×2
IV NS 1000ML BAXH (IV SOLUTION) ×1 IMPLANT
IV NS IRRIG 3000ML ARTHROMATIC (IV SOLUTION) ×2 IMPLANT
KIT RM TURNOVER CYSTO AR (KITS) ×2 IMPLANT
MANIFOLD NEPTUNE II (INSTRUMENTS) ×2 IMPLANT
NS IRRIG 500ML POUR BTL (IV SOLUTION) ×2 IMPLANT
PACK CYSTO (CUSTOM PROCEDURE TRAY) ×2 IMPLANT
SHEATH ACCESS URETERAL 24CM (SHEATH) ×1 IMPLANT
STENT POLARIS 5FRX24 (STENTS) ×1 IMPLANT
SYRINGE 10CC LL (SYRINGE) ×2 IMPLANT
TUBE CONNECTING 12X1/4 (SUCTIONS) IMPLANT
TUBE FEEDING 8FR 16IN STR KANG (MISCELLANEOUS) IMPLANT

## 2017-02-15 NOTE — Discharge Instructions (Signed)
1 - You may have urinary urgency (bladder spasms) and bloody urine on / off with stent in place. This is normal.  2 - Remove tethered stent on Monday morning at home by pulling on string, then blue-white plastic tubing, and discarding. Office is open Monday if any acute issues arise.   3 - Call MD or go to ER for fever >102, severe pain / nausea / vomiting not relieved by medications, or acute change in medical status  Post Anesthesia Home Care Instructions  Activity: Get plenty of rest for the remainder of the day. A responsible individual must stay with you for 24 hours following the procedure.  For the next 24 hours, DO NOT: -Drive a car -Paediatric nurse -Drink alcoholic beverages -Take any medication unless instructed by your physician -Make any legal decisions or sign important papers.  Meals: Start with liquid foods such as gelatin or soup. Progress to regular foods as tolerated. Avoid greasy, spicy, heavy foods. If nausea and/or vomiting occur, drink only clear liquids until the nausea and/or vomiting subsides. Call your physician if vomiting continues.  Special Instructions/Symptoms: Your throat may feel dry or sore from the anesthesia or the breathing tube placed in your throat during surgery. If this causes discomfort, gargle with warm salt water. The discomfort should disappear within 24 hours.  If you had a scopolamine patch placed behind your ear for the management of post- operative nausea and/or vomiting:  1. The medication in the patch is effective for 72 hours, after which it should be removed.  Wrap patch in a tissue and discard in the trash. Wash hands thoroughly with soap and water. 2. You may remove the patch earlier than 72 hours if you experience unpleasant side effects which may include dry mouth, dizziness or visual disturbances. 3. Avoid touching the patch. Wash your hands with soap and water after contact with the patch.   MAY TAKE IBUPROFEN AFTER 9 PM IF  NEEDED.  TORADAL 15 MG GIVEN IN SURGERY AT 3 PM

## 2017-02-15 NOTE — Anesthesia Preprocedure Evaluation (Addendum)
Anesthesia Evaluation  Patient identified by MRN, date of birth, ID band Patient awake    Reviewed: Allergy & Precautions, NPO status , Patient's Chart, lab work & pertinent test results  History of Anesthesia Complications (+) PONV  Airway Mallampati: II  TM Distance: >3 FB Neck ROM: Full    Dental no notable dental hx. (+) Teeth Intact, Caps, Dental Advisory Given,    Pulmonary neg pulmonary ROS,    Pulmonary exam normal breath sounds clear to auscultation       Cardiovascular hypertension, + CAD and + Past MI  Normal cardiovascular exam+ dysrhythmias Atrial Fibrillation  Rhythm:Regular Rate:Normal  02/2016  Left ventricle:  The cavity size was normal. There was moderate focal basal and mild concentric hypertrophy. Systolic function was normal. The estimated ejection fraction was in the range of 55% to 60%.  Regional wall motion abnormalities:   There is akinesis of the basal-midanteroseptal and inferoseptal myocardium.  There is akinesis of the apicalinferior myocardium. Normal sinus rhythm was absent. The study was not technically sufficient to allow evaluation of LV diastolic dysfunction due to atrial fibrillation.   ------------------------------------------------------------------- Aortic valve:   Trileaflet; mildly thickened, moderately calcified leaflets. Mobility was not restricted.  Doppler:  Transvalvular velocity was within the normal range. There was no stenosis. There was no regurgitation.  ------------------------------------------------------------------- Aorta:  Aortic root: The aortic root was normal in size.  ------------------------------------------------------------------- Mitral valve:   Structurally normal valve.   Mobility was not restricted.  Doppler:  Transvalvular velocity was within the normal range. There was no evidence for stenosis. There was no regurgitation.    Peak gradient (D): 3 mm  Hg   Neuro/Psych negative neurological ROS  negative psych ROS   GI/Hepatic negative GI ROS, Neg liver ROS,   Endo/Other  negative endocrine ROS  Renal/GU Renal InsufficiencyRenal disease  negative genitourinary   Musculoskeletal negative musculoskeletal ROS (+)   Abdominal   Peds negative pediatric ROS (+)  Hematology negative hematology ROS (+)   Anesthesia Other Findings   Reproductive/Obstetrics negative OB ROS                           Anesthesia Physical Anesthesia Plan  ASA: III  Anesthesia Plan: General   Post-op Pain Management:    Induction: Intravenous  PONV Risk Score and Plan: 4 or greater and Ondansetron, Dexamethasone and Treatment may vary due to age or medical condition  Airway Management Planned: LMA  Additional Equipment:   Intra-op Plan:   Post-operative Plan:   Informed Consent: I have reviewed the patients History and Physical, chart, labs and discussed the procedure including the risks, benefits and alternatives for the proposed anesthesia with the patient or authorized representative who has indicated his/her understanding and acceptance.   Dental advisory given  Plan Discussed with: CRNA and Surgeon  Anesthesia Plan Comments:         Anesthesia Quick Evaluation

## 2017-02-15 NOTE — Brief Op Note (Signed)
02/15/2017  3:10 PM  PATIENT:  [name]  83 y.o. female  PRE-OPERATIVE DIAGNOSIS:  RIGHT URETERAL STONE  POST-OPERATIVE DIAGNOSIS:  RIGHT URETERAL STONE  PROCEDURE:  Procedure(s): CYSTOSCOPY WITH RETROGRADE PYELOGRAM, URETEROSCOPY, STONE BASKETRY AND STENT REPLACEMENT (Right) HOLMIUM LASER APPLICATION (Right)  SURGEON:  Surgeon(s) and Role:    Alexis Frock, MD - Primary  PHYSICIAN ASSISTANT:   ASSISTANTS: none   ANESTHESIA:   general  EBL:  No intake/output data recorded.  BLOOD ADMINISTERED:none  DRAINS: none   LOCAL MEDICATIONS USED:  NONE  SPECIMEN:  Source of Specimen:  Rt ureteral / renal stone fragments  DISPOSITION OF SPECIMEN:  [facility] for compositional analysis  COUNTS:  YES  TOURNIQUET:  * No tourniquets in log *  DICTATION: .Other Dictation: Dictation Number [id number]  PLAN OF CARE: Discharge to home after PACU  PATIENT DISPOSITION:  PACU - hemodynamically stable.   Delay start of Pharmacological VTE agent (>24hrs) due to surgical blood loss or risk of bleeding: yes

## 2017-02-15 NOTE — Transfer of Care (Signed)
Immediate Anesthesia Transfer of Care Note  Patient: Shelly Obrien  Procedure(s) Performed: Procedure(s): CYSTOSCOPY WITH RETROGRADE PYELOGRAM, URETEROSCOPY, STONE BASKETRY AND STENT REPLACEMENT (Right) HOLMIUM LASER APPLICATION (Right)  Patient Location: PACU  Anesthesia Type:General  Level of Consciousness: awake, alert , oriented and patient cooperative  Airway & Oxygen Therapy: Patient Spontanous Breathing and Patient connected to nasal cannula oxygen  Post-op Assessment: Report given to RN and Post -op Vital signs reviewed and stable  Post vital signs: Reviewed and stable  Last Vitals:  Vitals:   02/15/17 1258 02/15/17 1524  BP: (!) 155/98 (!) (P) 143/87  Pulse: 70   Resp: 17 (P) 12  Temp: (!) 36.4 C (P) 36.8 C    Last Pain:  Vitals:   02/15/17 1343  TempSrc:   PainSc: 0-No pain      Patients Stated Pain Goal: 7 (74/08/14 4818)  Complications: No apparent anesthesia complications

## 2017-02-15 NOTE — Anesthesia Procedure Notes (Signed)
Procedure Name: LMA Insertion Date/Time: 02/15/2017 2:48 PM Performed by: Wanita Chamberlain Pre-anesthesia Checklist: Patient identified, Timeout performed, Emergency Drugs available, Suction available and Patient being monitored Patient Re-evaluated:Patient Re-evaluated prior to induction Oxygen Delivery Method: Circle system utilized Preoxygenation: Pre-oxygenation with 100% oxygen Induction Type: IV induction Ventilation: Mask ventilation without difficulty LMA: LMA inserted LMA Size: 4.0 Number of attempts: 2 Placement Confirmation: positive ETCO2 and breath sounds checked- equal and bilateral Tube secured with: Tape Dental Injury: Teeth and Oropharynx as per pre-operative assessment

## 2017-02-15 NOTE — Interval H&P Note (Signed)
History and Physical Interval Note:  02/15/2017 2:24 PM  Shelly Obrien  has presented today for surgery, with the diagnosis of RIGHT URETERAL STONE  The various methods of treatment have been discussed with the patient and family. After consideration of risks, benefits and other options for treatment, the patient has consented to  Procedure(s): CYSTOSCOPY WITH RETROGRADE PYELOGRAM, URETEROSCOPY AND STENT REPLACEMENT (Right) HOLMIUM LASER APPLICATION (Right) as a surgical intervention .  The patient's history has been reviewed, patient examined, no change in status, stable for surgery.  I have reviewed the patient's chart and labs.  Questions were answered to the patient's satisfaction.     Kirston Luty

## 2017-02-15 NOTE — H&P (Signed)
Shelly Obrien is an 83 y.o. female.    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 Final UCX from that time non-clonal and treated then and now pre-op with Cipro. No fevers.   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 Her PCP is Jola Baptist MD.  Today "Shelly Obrien" is seen to proceed with RIGHT ureteroscopy with goal of right side stone free for her right ureteral and renal stones that have been temporized with stenting. Most recent office UCX with some staph bacteruria treated with macrobid. NO iterval fevers.   Past Medical History:  Diagnosis Date  . Anticoagulant long-term use    xarelto  . CAD S/P percutaneous coronary angioplasty cardiologist-  dr Dorris Carnes (previous cardiologist- dr Lia Foyer)   hx MI 04/ 1994  s/p  PCI to LAD  . CKD (chronic kidney disease), stage III   . Complication of anesthesia    hard to wake   . History of basal cell carcinoma (BCC) excision    right eye area 08/ 2013  . History of kidney stones 01/20/2017  . History of MI (myocardial infarction) 09/1992   s/p  PCI to LAD  . History of squamous cell carcinoma excision    right lower leg 2015 ;  2016  . Hyperlipidemia   . Hypertension   . Macular degeneration   . PAF (paroxysmal atrial fibrillation) ([place]) dx 02-24-2016   followed by cardiologist-- dr Mamie Nick. [name]  . PONV (postoperative nausea and vomiting)   . Right ureteral stone   . Wears glasses     Past Surgical History:  Procedure Laterality Date  . ABDOMINAL HYSTERECTOMY    . BREAST EXCISIONAL BIOPSY Left 11/02/2009   fibroadenoma  (lumpectomy)  . BROW LIFT Right 04/07/2014   Procedure: REPAIR OF ENTROPION AND TRICHIASIS OF RIGHT LOWER EYE LID ;  Surgeon: Theodoro Kos, DO;  Location: [facility];  Service: Plastics;  Laterality: Right;  . CATARACT EXTRACTION W/ INTRAOCULAR LENS  IMPLANT, BILATERAL    . CORONARY ANGIOPLASTY  04/ 1994  dr Lia Foyer   PCI to LAD  . CYSTOSCOPY W/ URETERAL STENT PLACEMENT Right 01/20/2017   Procedure: CYSTOSCOPY WITH RIGHT RETROGRADE PYELOGRAM/RIGHT URETERAL STENT PLACEMENT;  Surgeon: Alexis Frock, MD;  Location: [facility];  Service: Urology;  Laterality: Right;  . CYSTOSCOPY/RETROGRADE/URETEROSCOPY/STONE EXTRACTION WITH BASKET  yrs ago  . DILATION AND CURETTAGE OF UTERUS    . KNEE SURGERY     right  . NEUROPLASTY / TRANSPOSITION ULNAR NERVE AT ELBOW Left 02-23-2000    [facility]   and Left Carpal Tunnel Release  . TRANSTHORACIC ECHOCARDIOGRAM  02/26/2016   moderate focal basal and mild concentric LVH,  ef 55-60%,  akinesis of the basal-midanteroseptal, inferoseptal and apicalinferior myocardium, study not sufficient to evaluation diastolic funciton due to atrial fib./  trivial PR/  mild TR    Family History  Problem Relation Age of Onset  . Coronary artery disease Unknown        positive family hx of  . Cancer Unknown        family hx of  . Brain cancer Son   .  Liver cancer Daughter    Social History:  reports that she has never smoked. She has never used smokeless tobacco. She reports that she does not drink alcohol or use drugs.  Allergies:  Allergies  Allergen Reactions  . Antihistamines, Chlorpheniramine-Type Other (See Comments)    Pt states that she passed out.   [name] Penicillins Anaphylaxis and Other (See Comments)    Has patient had a PCN reaction causing immediate rash, facial/tongue/throat swelling, SOB or lightheadedness with hypotension: Yes Has patient had a PCN reaction causing severe rash involving mucus membranes or skin necrosis: No Has patient had a PCN  reaction that required hospitalization No Has patient had a PCN reaction occurring within the last 10 years: No If all of the above answers are "NO", then may proceed with Cephalosporin use.  . Contrast Media [Iodinated Diagnostic Agents] Hives  . Sulfonamide Derivatives Other (See Comments)    Reaction:  Unknown   . Atorvastatin Other (See Comments)    Reaction:  Unknown   . Pheniramine Other (See Comments)    Pt states that she passed out.     No prescriptions prior to admission.    No results found for this or any previous visit (from the past 48 hour(s)). No results found.  Review of Systems  Constitutional: Negative.   HENT: Negative.   Eyes: Negative.   Respiratory: Negative.   Cardiovascular: Negative.   Gastrointestinal: Negative.   Genitourinary: Negative.   Musculoskeletal: Negative.   Skin: Negative.   Neurological: Negative.   Endo/Heme/Allergies: Negative.   Psychiatric/Behavioral: Negative.     There were no vitals taken for this visit. Physical Exam  Constitutional: She appears well-developed.  Very spry for age  Eyes: Pupils are equal, round, and reactive to light.  Neck: Normal range of motion.  Cardiovascular: Normal rate.   Respiratory: Effort normal.  GI: Soft.  Genitourinary:  Genitourinary Comments: NO CVAT at present.   Musculoskeletal: Normal range of motion.  Neurological: She is alert.  Skin: Skin is warm.     Assessment/Plan  Proceed as planned with RIGHT ureteroscopic stone manipulation. Risks, benefits, alternatives, expected peri-op course discussed previously and reiterated today.   Alexis Frock, MD 02/15/2017, 7:13 AM

## 2017-02-18 NOTE — Anesthesia Postprocedure Evaluation (Signed)
Anesthesia Post Note  Patient: MARYCATHERINE MANISCALCO  Procedure(s) Performed: Procedure(s) (LRB): CYSTOSCOPY WITH RETROGRADE PYELOGRAM, URETEROSCOPY, STONE BASKETRY AND STENT REPLACEMENT (Right) HOLMIUM LASER APPLICATION (Right)     Patient location during evaluation: PACU Anesthesia Type: General Level of consciousness: awake and alert Pain management: pain level controlled Vital Signs Assessment: post-procedure vital signs reviewed and stable Respiratory status: spontaneous breathing, nonlabored ventilation, respiratory function stable and patient connected to nasal cannula oxygen Cardiovascular status: blood pressure returned to baseline and stable Postop Assessment: no apparent nausea or vomiting Anesthetic complications: no    Last Vitals:  Vitals:   02/15/17 1615 02/15/17 1640  BP: (!) 144/89 (!) 146/81  Pulse: 66 63  Resp: 13 16  Temp:  36.5 C  SpO2: 96% 94%    Last Pain:  Vitals:   02/15/17 1640  TempSrc:   PainSc: 0-No pain                 Shaqueena Mauceri

## 2017-02-18 NOTE — Op Note (Signed)
NAME:  Shelly Obrien, Shelly Obrien NO.:  MEDICAL RECORD NO.:  [id number]  LOCATION:                                 FACILITY:  PHYSICIAN:  Alexis Frock, MD     DATE OF BIRTH:  January 01, 1923  DATE OF PROCEDURE: 02/15/2017                              OPERATIVE REPORT   DIAGNOSES:  Right ureteral renal stones, history of colic with urinoma.  PROCEDURES: 1. Cystoscopy with right retrograde pyelogram and interpretation. 2. Right ureteroscopy with laser lithotripsy. 3. Exchange of right ureteral stent, 5 x 24 Polaris with tether.  ESTIMATED BLOOD LOSS:  Nil.  COMPLICATION:  None.  SPECIMENS:  Right ureteral and renal stone fragments for compositional analysis.  FINDINGS: 1. Resolution of prior extravasation in urinoma with retrograde     pyelogram. 2. Antegrade progression of right mid ureteral stone, now at the     distal ureter.  This was amenable to basketing. 3. Right renal pelvis stone approximately 8 mm, not amenable to     basketing, this was treated with laser lithotripsy and then     extraction. 4. Successful replacement of right ureteral stent, proximal in the     renal pelvis and distal in the bladder.  INDICATIONS:  Shelly Obrien is a very pleasant and spry 83 year old woman who was found on workup of colicky flank pain and bacteriuria, have a right ureteral stone associated with urinoma, this was last month. Given her bacteriuria and urinoma and colic, it was felt that a staged approach with stenting with allowing her to clear infectious parameters, and time for her urinoma resolved followed by ureteroscopy would be the most prudent way to proceed.  She underwent stenting last month.  She has been on antibiotics according to most recent cultures, has cleared her infectious parameters and now presents for definitive stone management with ureteroscopy.  Informed consent was obtained and placed in the medical record.  PROCEDURE IN DETAIL:  The patient  being, Shelly Obrien, was verified. Procedure being right ureteroscopic stone manipulation was confirmed. Procedure was carried out.  Time-out was performed.  Intravenous antibiotics were administered.  General LMA anesthesia introduced.  The patient was placed into a low lithotomy position.  Sterile field was created by prepping and draping the patient's vagina, introitus, and proximal thighs using iodine.  Next, cystourethroscopy was performed using a 22-French rigid cystoscope with offset lens.  Inspection of the urinary bladder revealed the distal end of the stent in situ.  This was grasped, brought to the level of the urethral meatus.  Through which, a 0.038 Zip wire was advanced to the level of the upper pole.  The stent was exchanged for an open-ended catheter and right retrograde pyelogram was obtained.  Right retrograde pyelogram demonstrated a single right ureter with single-system right kidney.  There was no obvious extravasation.  Her urinoma appeared to be resolved by this criteria.  There was a mobile filling defect in the renal pelvis consistent with known intrarenal stone.  The 0.038 Zip wire was once again advanced to the level of the upper pole, set aside as a safety wire.  An 8-French feeding tube placed  in the urinary bladder for pressure release.  Next, semi-rigid ureteroscopy was performed to the distal right ureter alongside a separate Sensor working wire and the distal ureter.  A stone was encountered.  This likely represented antegrade progression of her prior mid-ureteral stone.  This did appear amenable to simple basketing.  It was grasped on its long axis, removed, set aside for compositional analysis.  Repeat semi-rigid ureteroscopy was performed to the entire length of right ureter and no mucosal abnormalities were found as the goal today was ipsilateral stone free.  The semi-rigid scope was exchanged for 12/14, 24-cm ureteral access sheath at the level of  the proximal ureter using continuous fluoroscopic guidance.  Then, flexible digital ureteroscopy was performed to the proximal right ureter and systematic inspection of the right kidney including all calices x3.  A single intrarenal pelvis stone was noted, it was approximately 8 mm.  It appeared to be too large for simple basketing.  As such, it was positioned into an upper pole calyx and holmium laser energy applied to the stone using settings of 0.3 joules and 30 Hz.  This fragmented into three smaller pieces, which were then grasped sequentially on their long axis with an Escape basket, removed and set aside for compositional analysis.  Following these maneuvers, there was excellent hemostasis, complete resolution of all stone fragments larger than 1/3rd mm.  The excess sheath was removed under continuous vision.  No mucosal abnormalities were found, and finally, a new 5 x 24 Polaris-type stent was placed over the remaining safety wire using fluoroscopic guidance. Good proximal and distal deployment were noted.  A tether was left in place and fashioned to the inner thigh and procedure was terminated. The patient tolerated the procedure well.  There were no immediate periprocedural complications.  The patient was taken to the postanesthesia care unit in stable condition.          ______________________________ Alexis Frock, MD     TM/MEDQ  D:  02/15/2017  T:  02/15/2017  Job:  [id number]

## 2017-02-19 ENCOUNTER — Encounter (HOSPITAL_BASED_OUTPATIENT_CLINIC_OR_DEPARTMENT_OTHER): Payer: Self-pay | Admitting: Urology

## 2017-02-19 ENCOUNTER — Telehealth: Payer: Self-pay | Admitting: Neurology

## 2017-02-19 DIAGNOSIS — N202 Calculus of kidney with calculus of ureter: Secondary | ICD-10-CM | POA: Diagnosis not present

## 2017-02-19 NOTE — Telephone Encounter (Signed)
Sidney has been trying to reach to Shelly Obrien to schedule apt. I called and spoke to Shelly Obrien and she relayed she had been in the hospital with her kidney stones . Shelly Obrien is fine and she is at home now. Shelly Obrien relayed she will call [facility]  in a few weeks to schedule a apt with them. Shelly Obrien relayed she had there telephone number number. I have contacted  [employer] as well.

## 2017-03-04 DIAGNOSIS — N202 Calculus of kidney with calculus of ureter: Secondary | ICD-10-CM | POA: Diagnosis not present

## 2017-03-05 DIAGNOSIS — R42 Dizziness and giddiness: Secondary | ICD-10-CM | POA: Diagnosis not present

## 2017-03-05 DIAGNOSIS — Z23 Encounter for immunization: Secondary | ICD-10-CM | POA: Diagnosis not present

## 2017-03-05 DIAGNOSIS — R29898 Other symptoms and signs involving the musculoskeletal system: Secondary | ICD-10-CM | POA: Diagnosis not present

## 2017-03-05 DIAGNOSIS — N200 Calculus of kidney: Secondary | ICD-10-CM | POA: Diagnosis not present

## 2017-03-05 DIAGNOSIS — I10 Essential (primary) hypertension: Secondary | ICD-10-CM | POA: Diagnosis not present

## 2017-03-05 DIAGNOSIS — Z79899 Other long term (current) drug therapy: Secondary | ICD-10-CM | POA: Diagnosis not present

## 2017-03-06 ENCOUNTER — Other Ambulatory Visit: Payer: Self-pay | Admitting: Internal Medicine

## 2017-03-06 DIAGNOSIS — R29898 Other symptoms and signs involving the musculoskeletal system: Secondary | ICD-10-CM

## 2017-03-07 DIAGNOSIS — H353221 Exudative age-related macular degeneration, left eye, with active choroidal neovascularization: Secondary | ICD-10-CM | POA: Diagnosis not present

## 2017-03-07 DIAGNOSIS — H35362 Drusen (degenerative) of macula, left eye: Secondary | ICD-10-CM | POA: Diagnosis not present

## 2017-03-07 DIAGNOSIS — H353231 Exudative age-related macular degeneration, bilateral, with active choroidal neovascularization: Secondary | ICD-10-CM | POA: Diagnosis not present

## 2017-03-11 DIAGNOSIS — M50122 Cervical disc disorder at C5-C6 level with radiculopathy: Secondary | ICD-10-CM | POA: Diagnosis not present

## 2017-03-14 ENCOUNTER — Telehealth: Payer: Self-pay | Admitting: Internal Medicine

## 2017-03-14 NOTE — Telephone Encounter (Signed)
Spoke with patient and she thinks she is in the donut hole and XARELTO is $200.00 per month, she is unable to afford this. We discussed patient assistance program through [place] and she is agreeable to completing forms. I will mail those to her today.

## 2017-03-14 NOTE — Telephone Encounter (Signed)
New Message  Pt c/o medication issue:  1. Name of Medication: Xarelto   2. How are you currently taking this medication (dosage and times per day)? 15mg   3. Are you having a reaction (difficulty breathing--STAT)? no  4. What is your medication issue? Per pt med is not $200. Pt can not afford. Pt  Would like to know it there is an alternate medication she can take. Please call back to discuss

## 2017-03-14 NOTE — Telephone Encounter (Signed)
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

## 2017-03-15 ENCOUNTER — Telehealth: Payer: Self-pay | Admitting: Internal Medicine

## 2017-03-15 NOTE — Telephone Encounter (Signed)
° °  [facility] Pre-operative Risk Assessment    Request for surgical clearance:  1. What type of surgery is being performed? SNRB  2. When is this surgery scheduled? 04/04/17  3. Are there any medications that need to be held prior to surgery and how long? Xarelto, x 3 days prior  4. Practice name and name of physician performing surgery? [facility], physician not listed.   5. What is your office phone and fax number? PH# [phone] ext. [phone], FAX # [phone]  6. Anesthesia type (None, local, MAC, general) ? unspecified     _________________________________________________________________   (provider comments below)

## 2017-03-18 NOTE — Telephone Encounter (Signed)
Reply faxed to [facility] regarding Xarelto.

## 2017-03-18 NOTE — Telephone Encounter (Signed)
Patient with diagnosis of atrial fibrillation on Xarelto for anticoagulation.    Procedure: SNRB Date of procedure: November 1  CHADS2 score of 2 (HTN, AGE);  CHADS2-VASc score of  5 (HTN, AGE x 2, CAD, female)  CrCl 41.7 Platelet count 192  Per office protocol, patient can hold Xarelto for 3 days prior to procedure.    Patient should restart Xarelto on the evening of procedure or day after, at discretion of procedure MD

## 2017-04-04 DIAGNOSIS — M50122 Cervical disc disorder at C5-C6 level with radiculopathy: Secondary | ICD-10-CM | POA: Diagnosis not present

## 2017-04-17 ENCOUNTER — Ambulatory Visit
Admission: RE | Admit: 2017-04-17 | Discharge: 2017-04-17 | Disposition: A | Discharge status: Home / Self Care | Payer: Medicare Other | Source: Ambulatory Visit | Attending: Internal Medicine | Admitting: Internal Medicine

## 2017-04-17 DIAGNOSIS — M4807 Spinal stenosis, lumbosacral region: Secondary | ICD-10-CM | POA: Diagnosis not present

## 2017-04-17 DIAGNOSIS — R29898 Other symptoms and signs involving the musculoskeletal system: Secondary | ICD-10-CM

## 2017-04-23 NOTE — Progress Notes (Signed)
[facility]  PATIENT: Shelly Obrien DOB: Dec 06, 1922   REASON FOR VISIT: Follow-up for dizziness HISTORY FROM: Patient    HISTORY OF PRESENT ILLNESS: 11/21/2018CM Shelly Obrien, 83 year old female returns for follow-up for history of neck pain and dizziness off and on for years.  Her pain is no better or no worse and her dizziness is no better or worse.  MRI brain 01/26/17  was unremarkable for her age but her MRI cervical spine showed arthritis and pinched nerves. Dr. Jaynee Eagles recommended  either a NSY referral to discuss surgery or an Ortho referral to discuss shots in the neck. Pt has seen Dr. Nelva Bush  at [facility].  She had an injection approximately 2 weeks ago and has not received much benefit.  She was made aware she may need additional injections.  In the interim since last seen she was in the hospital for kidney stones .  She is planning on following up with vestibular rehab.  Her primary care ordered MRI lumbar spine for ankle foot numbness which was done on 04/17/2017. 1. Advanced and diffuse degeneration with mild scoliosis. 2. Multilevel foraminal impingement due to disc height loss and spurring, most notable on the right at T12-L1, bilaterally at L1-2, and on the left at L2-3 and L3-4. 3. Spinal stenosis that is moderate at L3-4 and high-grade at L4-5. 4. L5-S1 inferiorly migrating right paracentral disc protrusion. Although the herniation is small there is impingement on the right S1 nerve root. 5. Cholelithiasis. She denies any recent falls.  She continues to live independently.  She continues to drive..  She does her own housework, cooking etc.  She returns for reevaluation  01/10/17 Shelly Obrien is a 83 y.o. female here as a referral for benign positional vertigo. Patient has past medical history hyperlipidemia, essential hypertension, myocardial infarction, coronary artery disease, cholelithiasis, H fibrillation, chronic kidney disease, stage III  macular degeneration, ectropion of the right eye secondary to an excision, excised lesion on her lower leg. She has multiple allergies.  Patient reports she has neck pain and dizziness. She has had dizziness off and on for years and pain on the left side of her neck, pain from the neck and shooting into the left arm. It is the worst paoin she feels. She can elicit the pain with neck movement and weakness and numbness in the left hand. The pain is severe and progressive, sharp and brief, happens daily like when she is putting on her bra when she moves her arm. She have dizziness on and off, it will "hit" 1-2 times a day for several minutes and she feels very dizzy like she is "going out", and imbalance during the episodes, she feels unsteady and dizzy. If happens with sudden movement of the neck such as when turning to the left or back. Happens when she roll sover to the left side. No other focal neurologic deficits, associated symptoms, inciting events or modifiable factors.    REVIEW OF SYSTEMS: Full 14 system review of systems performed and notable only for those listed, all others are neg:  Constitutional: neg  Cardiovascular: neg Ear/Nose/Throat: neg  Skin: neg Eyes: neg Respiratory: neg Gastroitestinal: neg Genitourinary recent kidney stones Hematology/Lymphatic: neg  Endocrine: neg Musculoskeletal: Neck pain back pain joint pain Allergy/Immunology: neg Neurological: Dizziness Psychiatric: neg Sleep : neg   ALLERGIES: Allergies  Allergen Reactions  . Antihistamines, Chlorpheniramine-Type Other (See Comments)    Pt states that she passed out.   [name] Penicillins Anaphylaxis and Other (  See Comments)    Has patient had a PCN reaction causing immediate rash, facial/tongue/throat swelling, SOB or lightheadedness with hypotension: Yes Has patient had a PCN reaction causing severe rash involving mucus membranes or skin necrosis: No Has patient had a PCN reaction that required hospitalization  No Has patient had a PCN reaction occurring within the last 10 years: No If all of the above answers are "NO", then may proceed with Cephalosporin use.  . Contrast Media [Iodinated Diagnostic Agents] Hives  . Sulfonamide Derivatives Other (See Comments)    Reaction:  Unknown   . Atorvastatin Other (See Comments)    Reaction:  Unknown   . Pheniramine Other (See Comments)    Pt states that she passed out.     HOME MEDICATIONS: Outpatient Medications Prior to Visit  Medication Sig Dispense Refill  . amLODipine (NORVASC) 2.5 MG tablet Take 2.5 mg by mouth daily.    . metoprolol tartrate (LOPRESSOR) 25 MG tablet Take 1 tablet (25 mg total) by mouth 2 (two) times daily. (Patient taking differently: Take 25 mg by mouth daily. ) 60 tablet 0  . Multiple Vitamins-Minerals (ICAPS AREDS 2) CAPS Take 1 capsule by mouth daily.    . Rivaroxaban (XARELTO) 15 MG TABS tablet Take 1 tablet (15 mg total) by mouth daily with supper. 30 tablet 11  . traMADol (ULTRAM) 50 MG tablet Take 1 tablet (50 mg total) by mouth every 6 (six) hours as needed for moderate pain or severe pain. Post-operatively. 15 tablet 0  . triamcinolone (NASACORT ALLERGY 24HR) 55 MCG/ACT AERO nasal inhaler Place 1-2 sprays into the nose 2 (two) times daily as needed (for rhinitis).      No facility-administered medications prior to visit.     PAST MEDICAL HISTORY: Past Medical History:  Diagnosis Date  . Anticoagulant long-term use    xarelto  . CAD S/P percutaneous coronary angioplasty cardiologist-  dr Dorris Carnes (previous cardiologist- dr Lia Foyer)   hx MI 04/ 1994  s/p  PCI to LAD  . CKD (chronic kidney disease), stage III ([place])   . Complication of anesthesia    hard to wake   . History of basal cell carcinoma (BCC) excision    right eye area 08/ 2013  . History of kidney stones 01/20/2017  . History of MI (myocardial infarction) 09/1992   s/p  PCI to LAD  . History of squamous cell carcinoma excision    right lower leg  2015 ;  2016  . Hyperlipidemia   . Hypertension   . Macular degeneration   . PAF (paroxysmal atrial fibrillation) ([place]) dx 02-24-2016   followed by cardiologist-- dr Mamie Nick. [name]  . PONV (postoperative nausea and vomiting)   . Right ureteral stone   . Wears glasses     PAST SURGICAL HISTORY: Past Surgical History:  Procedure Laterality Date  . ABDOMINAL HYSTERECTOMY    . BREAST EXCISIONAL BIOPSY Left 11/02/2009   fibroadenoma (lumpectomy)  . BROW LIFT Right 04/07/2014   Procedure: REPAIR OF ENTROPION AND TRICHIASIS OF RIGHT LOWER EYE LID ;  Surgeon: Theodoro Kos, DO;  Location: [place];  Service: Plastics;  Laterality: Right;  . CATARACT EXTRACTION W/ INTRAOCULAR LENS  IMPLANT, BILATERAL    . CORONARY ANGIOPLASTY  04/ 1994  dr Lia Foyer   PCI to LAD  . CYSTOSCOPY W/ URETERAL STENT PLACEMENT Right 01/20/2017   Procedure: CYSTOSCOPY WITH RIGHT RETROGRADE PYELOGRAM/RIGHT URETERAL STENT PLACEMENT;  Surgeon: Alexis Frock, MD;  Location: [facility];  Service: Urology;  Laterality: Right;  . CYSTOSCOPY WITH RETROGRADE PYELOGRAM, URETEROSCOPY AND STENT PLACEMENT Right 02/15/2017   Procedure: CYSTOSCOPY WITH RETROGRADE PYELOGRAM, URETEROSCOPY, STONE BASKETRY AND STENT REPLACEMENT;  Surgeon: Alexis Frock, MD;  Location: [facility];  Service: Urology;  Laterality: Right;  . CYSTOSCOPY/RETROGRADE/URETEROSCOPY/STONE EXTRACTION WITH BASKET  yrs ago  . DILATION AND CURETTAGE OF UTERUS    . HOLMIUM LASER APPLICATION Right 2/37/6283   Procedure: HOLMIUM LASER APPLICATION;  Surgeon: Alexis Frock, MD;  Location: [facility];  Service: Urology;  Laterality: Right;  . KNEE SURGERY     right  . NEUROPLASTY / TRANSPOSITION ULNAR NERVE AT ELBOW Left 02-23-2000    [facility]   and Left Carpal Tunnel Release  . TRANSTHORACIC ECHOCARDIOGRAM  02/26/2016   moderate focal basal and mild concentric LVH,  ef 55-60%,  akinesis of the basal-midanteroseptal, inferoseptal and  apicalinferior myocardium, study not sufficient to evaluation diastolic funciton due to atrial fib./  trivial PR/  mild TR    FAMILY HISTORY: Family History  Problem Relation Age of Onset  . Coronary artery disease Unknown        positive family hx of  . Cancer Unknown        family hx of  . Brain cancer Son   . Liver cancer Daughter     SOCIAL HISTORY: Social History   Socioeconomic History  . Marital status: Widowed    Spouse name: Not on file  . Number of children: 2  . Years of education: Not on file  . Highest education level: Not on file  Social Needs  . Financial resource strain: Not on file  . Food insecurity - worry: Not on file  . Food insecurity - inability: Not on file  . Transportation needs - medical: Not on file  . Transportation needs - non-medical: Not on file  Occupational History  . Occupation: Retired  Tobacco Use  . Smoking status: Never Smoker  . Smokeless tobacco: Never Used  Substance and Sexual Activity  . Alcohol use: No    Alcohol/week: 0.0 oz  . Drug use: No  . Sexual activity: Not on file  Other Topics Concern  . Not on file  Social History Narrative   Lives at home     PHYSICAL EXAM  Vitals:   04/24/17 1301  BP: (!) 156/90  Pulse: 65  Weight: 162 lb 9.6 oz (73.8 kg)  Height: 5\' 5"  (1.651 m)   Body mass index is 27.06 kg/m.  Generalized: Well developed, in no acute distress  Head: normocephalic and atraumatic,. Oropharynx benign  Neck: Supple, no carotid bruits  Musculoskeletal: No deformity   Neurological examination   Mentation: Alert oriented to time, place, history taking. Attention span and concentration appropriate. Recent and remote memory intact.  Follows all commands speech and language fluent.   Cranial nerve II-XII: Pupils were equal round reactive to light extraocular movements were full, visual field were full on confrontational test. Right Exotropia. Facial sensation and strength were normal. hearing was  intact to finger rubbing bilaterally. Uvula tongue midline. head turning and shoulder shrug were normal and symmetric.Tongue protrusion into cheek strength was normal. Motor: normal bulk and tone, full strength in the BUE, BLE,  Sensory: normal and symmetric to light touch, pinprick, and  Vibration, in the upper and lower extremities Coordination: finger-nose-finger, heel-to-shin bilaterally, no dysmetria Reflexes: 1+ upper lower and symmetric, plantar responses were flexor bilaterally. Gait and Station: Rising up from seated position without assistance, stooped posture, ambulated short  distance in the hall no difficulty with turns no assistive device  DIAGNOSTIC DATA (LABS, IMAGING, TESTING) - I reviewed patient records, labs, notes, testing and imaging myself where available.  Lab Results  Component Value Date   WBC 9.9 01/20/2017   HGB 11.2 (L) 02/15/2017   HCT 33.0 (L) 02/15/2017   MCV 95.3 01/20/2017   PLT 192 01/20/2017      Component Value Date/Time   NA 145 02/15/2017 1344   NA 143 01/10/2017 1437   K 4.0 02/15/2017 1344   CL 108 02/15/2017 1344   CO2 25 01/20/2017 0324   GLUCOSE 114 (H) 02/15/2017 1344   BUN 13 02/15/2017 1344   BUN 14 01/10/2017 1437   CREATININE 1.00 02/15/2017 1344   CREATININE 1.01 (H) 05/14/2016 0944   CALCIUM 9.6 01/20/2017 0324   PROT 7.3 01/24/2016 1300   ALBUMIN 4.1 01/24/2016 1300   AST 20 01/24/2016 1300   ALT 16 01/24/2016 1300   ALKPHOS 110 01/24/2016 1300   BILITOT 1.2 01/24/2016 1300   GFRNONAA 35 (L) 01/20/2017 0324   GFRAA 40 (L) 01/20/2017 0324    ASSESSMENT AND PLAN  83 y.o. year old female.  As a referral for neck pain and dizziness.  She also has history of hyperlipidemia essential hypertension MI coronary artery disease atrial fibrillation chronic kidney disease.MRI brain 01/26/17  was unremarkable for her age but her MRI cervical spine showed arthritis and pinched nerves. Dr. Jaynee Eagles recommended  either a NSY referral to discuss  surgery or an Ortho referral to discuss shots in the neck. Pt has seen Dr. Nelva Bush  at [facility].  She had an injection approximately 2 weeks ago and has not received much benefit.  Since last seen  she has seen her primary provider for foot and ankle numbness 04/17/2017.MRI of the LS 1. Advanced and diffuse degeneration with mild scoliosis. 2. Multilevel foraminal impingement due to disc height loss and spurring, most notable on the right at T12-L1, bilaterally at L1-2, and on the left at L2-3 and L3-4. 3. Spinal stenosis that is moderate at L3-4 and high-grade at L4-5. 4. L5-S1 inferiorly migrating right paracentral disc protrusion. Although the herniation is small there is impingement on the right S1 nerve root. 5. Cholelithiasis.    PLAN: Continue F/U with Dr. Nelva Bush for injections Dr. Jaynee Eagles reviewed MRI of the lumbar spine and recommends either NS consult or evaluation by Dr. Nelva Bush at next visit.  Patient declined neurosurgery consult.  She will discussed injection with Dr. Nelva Bush at next visit Vestibular rehab may help dizziness this has already been ordered F/U prn Dennie Bible, [facility], [facility], APRN  [facility] [address], [place], [place] [address] [phone]

## 2017-04-24 ENCOUNTER — Encounter: Payer: Self-pay | Admitting: Nurse Practitioner

## 2017-04-24 ENCOUNTER — Ambulatory Visit (INDEPENDENT_AMBULATORY_CARE_PROVIDER_SITE_OTHER): Payer: Medicare Other | Admitting: Nurse Practitioner

## 2017-04-24 DIAGNOSIS — M4802 Spinal stenosis, cervical region: Secondary | ICD-10-CM | POA: Diagnosis not present

## 2017-04-24 DIAGNOSIS — I2510 Atherosclerotic heart disease of native coronary artery without angina pectoris: Secondary | ICD-10-CM | POA: Diagnosis not present

## 2017-04-24 DIAGNOSIS — M48061 Spinal stenosis, lumbar region without neurogenic claudication: Secondary | ICD-10-CM

## 2017-04-24 DIAGNOSIS — R42 Dizziness and giddiness: Secondary | ICD-10-CM | POA: Insufficient documentation

## 2017-04-24 NOTE — Progress Notes (Signed)
Personally  participated in, made any corrections needed, and agree with history, physical, neuro exam,assessment and plan as stated above.    Berkley Cronkright, MD [facility] 

## 2017-04-24 NOTE — Patient Instructions (Signed)
Continue F/U with Dr. Nelva Bush for injections Dr. Jaynee Eagles reviewed MRI of the lumbar spine and recommends either NS consult or evaluation by Dr. Nelva Bush at next visit Vestibular rehab may help dizziness F/U prn

## 2017-05-09 DIAGNOSIS — H353221 Exudative age-related macular degeneration, left eye, with active choroidal neovascularization: Secondary | ICD-10-CM | POA: Diagnosis not present

## 2017-05-09 DIAGNOSIS — H353211 Exudative age-related macular degeneration, right eye, with active choroidal neovascularization: Secondary | ICD-10-CM | POA: Diagnosis not present

## 2017-06-06 DIAGNOSIS — H353221 Exudative age-related macular degeneration, left eye, with active choroidal neovascularization: Secondary | ICD-10-CM | POA: Diagnosis not present

## 2017-06-06 DIAGNOSIS — H353211 Exudative age-related macular degeneration, right eye, with active choroidal neovascularization: Secondary | ICD-10-CM | POA: Diagnosis not present

## 2017-06-12 ENCOUNTER — Ambulatory Visit: Payer: Medicare Other | Attending: Internal Medicine | Admitting: Physical Therapy

## 2017-06-12 ENCOUNTER — Encounter: Payer: Self-pay | Admitting: Physical Therapy

## 2017-06-12 DIAGNOSIS — R2681 Unsteadiness on feet: Secondary | ICD-10-CM | POA: Diagnosis not present

## 2017-06-12 DIAGNOSIS — R42 Dizziness and giddiness: Secondary | ICD-10-CM | POA: Insufficient documentation

## 2017-06-12 DIAGNOSIS — R2689 Other abnormalities of gait and mobility: Secondary | ICD-10-CM | POA: Diagnosis not present

## 2017-06-12 DIAGNOSIS — R293 Abnormal posture: Secondary | ICD-10-CM | POA: Diagnosis not present

## 2017-06-12 NOTE — Therapy (Signed)
[facility] [address] [place], [place], [address] Phone: [phone]   Fax:  [phone]  Physical Therapy Evaluation  Patient Details  Name: Shelly Obrien MRN: [id number] Date of Birth: [birth year] Referring Provider: Melvenia Beam, MD   Encounter Date: 06/12/2017  PT End of Session - 06/12/17 1312    Visit Number  1    Number of Visits  12    Date for PT Re-Evaluation  07/24/17    Authorization Type  Medicare    PT Start Time  1228    PT Stop Time  1310    PT Time Calculation (min)  42 min    Activity Tolerance  Patient tolerated treatment well    Behavior During Therapy  [facility] for tasks assessed/performed       Past Medical History:  Diagnosis Date  . Anticoagulant long-term use    xarelto  . CAD S/P percutaneous coronary angioplasty cardiologist-  dr Dorris Carnes (previous cardiologist- dr Lia Foyer)   hx MI 04/ 1994  s/p  PCI to LAD  . CKD (chronic kidney disease), stage III ([place])   . Complication of anesthesia    hard to wake   . History of basal cell carcinoma (BCC) excision    right eye area 08/ 2013  . History of kidney stones 01/20/2017  . History of MI (myocardial infarction) 09/1992   s/p  PCI to LAD  . History of squamous cell carcinoma excision    right lower leg 2015 ;  2016  . Hyperlipidemia   . Hypertension   . Macular degeneration   . PAF (paroxysmal atrial fibrillation) ([place]) dx 02-24-2016   followed by cardiologist-- dr Mamie Nick. [name]  . PONV (postoperative nausea and vomiting)   . Right ureteral stone   . Wears glasses     Past Surgical History:  Procedure Laterality Date  . ABDOMINAL HYSTERECTOMY    . BREAST EXCISIONAL BIOPSY Left 11/02/2009   fibroadenoma (lumpectomy)  . BROW LIFT Right 04/07/2014   Procedure: REPAIR OF ENTROPION AND TRICHIASIS OF RIGHT LOWER EYE LID ;  Surgeon: Theodoro Kos, DO;  Location: [facility];  Service: Plastics;  Laterality: Right;  .  CATARACT EXTRACTION W/ INTRAOCULAR LENS  IMPLANT, BILATERAL    . CORONARY ANGIOPLASTY  04/ 1994  dr Lia Foyer   PCI to LAD  . CYSTOSCOPY W/ URETERAL STENT PLACEMENT Right 01/20/2017   Procedure: CYSTOSCOPY WITH RIGHT RETROGRADE PYELOGRAM/RIGHT URETERAL STENT PLACEMENT;  Surgeon: Alexis Frock, MD;  Location[facility];  Service: Urology;  Laterality: Right;  . CYSTOSCOPY WITH RETROGRADE PYELOGRAM, URETEROSCOPY AND STENT PLACEMENT Right 02/15/2017   Procedure: CYSTOSCOPY WITH RETROGRADE PYELOGRAM, URETEROSCOPY, STONE BASKETRY AND STENT REPLACEMENT;  Surgeon: Alexis Frock, MD;  Location: [facility];  Service: Urology;  Laterality: Right;  . CYSTOSCOPY/RETROGRADE/URETEROSCOPY/STONE EXTRACTION WITH BASKET  yrs ago  . DILATION AND CURETTAGE OF UTERUS    . HOLMIUM LASER APPLICATION Right 1/88/4166   Procedure: HOLMIUM LASER APPLICATION;  Surgeon: Alexis Frock, MD;  Location: [facility];  Service: Urology;  Laterality: Right;  . KNEE SURGERY     right  . NEUROPLASTY / TRANSPOSITION ULNAR NERVE AT ELBOW Left 02-23-2000    [facility]   and Left Carpal Tunnel Release  . TRANSTHORACIC ECHOCARDIOGRAM  02/26/2016   moderate focal basal and mild concentric LVH,  ef 55-60%,  akinesis of the basal-midanteroseptal, inferoseptal and apicalinferior myocardium, study not sufficient to evaluation diastolic funciton due to atrial fib./  trivial PR/  mild TR    There were no vitals filed for this visit.   Subjective Assessment - 06/12/17 1230    Subjective  Pt is a 84 y/o female who presents to OPPT with c/o vertigo especially with looking up.  Pt reports vertigo "runs up my neck and into my head."  Pt also with neck pain, treated so far with injections which were unsuccessful.  Pt then returned to Dr. Jaynee Eagles who recommended additional injection, but has to see PA at orthopedic for formal exam, which is scheduled for next week.  Pt reports symptoms began in August 2018.  Pt reports hx of  "once in awhile inner ear problem" and is convinced it is related to sinuses.    Patient Stated Goals  improve dizziness and symptoms    Currently in Pain?  No/denies reports occasional headache after         [facility] PT Assessment - 06/12/17 1235      Assessment   Medical Diagnosis  vertigo    Referring Provider  Melvenia Beam, MD    Onset Date/Surgical Date  -- Aug 2018    Next MD Visit  PRN    Prior Therapy  feels she's been here before for vertigo      Precautions   Precautions  Fall      Restrictions   Weight Bearing Restrictions  No      Balance Screen   Has the patient fallen in the past 6 months  No    Has the patient had a decrease in activity level because of a fear of falling?   Yes    Is the patient reluctant to leave their home because of a fear of falling?   No      Home Environment   Living Environment  Private residence    Living Arrangements  Alone    Type of [place] to enter    Entrance Stairs-Number of Steps  1    [place]  One level    [place] - single point      Prior Function   Level of [place]  Retired    Leisure  go to church; sings in prisons and with church; go out with friends      Cognition   Overall Cognitive Status  Within Functional Limits for tasks assessed      Observation/Other Assessments   Focus on Therapeutic Outcomes (FOTO)   67 (33% limited; predicted 27% limited)      Posture/Postural Control   Posture/Postural Control  Postural limitations    Postural Limitations  Rounded Shoulders;Forward head;Increased thoracic kyphosis      ROM / Strength   AROM / PROM / Strength  AROM      AROM   AROM Assessment Site  Cervical    Cervical Flexion  WNL    Cervical Extension  limited 50%    Cervical - Right Side Bend  limited 75%    Cervical - Left Side Bend  limited 75%    Cervical - Right Rotation  limited 25%    Cervical - Left Rotation  limited 30-40%       Palpation   Spinal mobility  hypomobile c spine with pain; firm end feels with rotation    Palpation comment  trigger points noted Lt upper trap and levator; cervical paraspinals      Special  Tests    Special Tests  Cervical    Cervical Tests  Dictraction      Distraction Test   Findngs  Negative      Ambulation/Gait   Ambulation/Gait  Yes    Ambulation/Gait Assistance  5: Supervision    Ambulation Distance (Feet)  100 Feet    Assistive device  None    Gait Pattern  Lateral hip instability;Shuffle;Wide base of support    Ambulation Surface  Level;Indoor         Vestibular Assessment - 06/12/17 1239      Vestibular Assessment   General Observation  no symptoms at rest      Symptom Behavior   Type of Dizziness  Imbalance "something just goes over my head"; spinning    Frequency of Dizziness  typically with looking up; unable to be more specific    Duration of Dizziness  few minutes    Aggravating Factors  Looking up to the ceiling;Spontaneous onset    Relieving Factors  Medication      Occulomotor Exam   Occulomotor Alignment  Normal    Spontaneous  Absent    Gaze-induced  Absent    Smooth Pursuits  Intact    Saccades  Slow mild symptoms to Rt      Vestibulo-Occular Reflex   VOR 1 Head Only (x 1 viewing)  WNL    Comment  head impulse test WNL; mild increase in dizziness to Rt      Positional Testing   Sidelying Test  Sidelying Right;Sidelying Left    Horizontal Canal Testing  Horizontal Canal Right;Horizontal Canal Left      Sidelying Right   Sidelying Right Duration  ?3-5 sec of nystagmus; pt asymptomatic    Sidelying Right Symptoms  Upbeat, right rotatory nystagmus      Sidelying Left   Sidelying Left Duration  none    Sidelying Left Symptoms  No nystagmus      Horizontal Canal Right   Horizontal Canal Right Duration  none    Horizontal Canal Right Symptoms  Normal      Horizontal Canal Left   Horizontal Canal Left Duration  none    Horizontal Canal  Left Symptoms  Normal         Objective measurements completed on examination: See above findings.      [facility] Adult PT Treatment/Exercise - 06/12/17 1309      Exercises   Exercises  Neck      Neck Exercises: Supine   Neck Retraction  5 reps;5 secs mod cues for technique    Cervical Rotation  Both;5 reps 5 sec hold    Other Supine Exercise  scapular retraction x 5 reps             PT Education - 06/12/17 1311    Education provided  Yes    Education Details  HEP, DN, clinical findings    Person(s) Educated  Patient    Methods  Explanation;Demonstration;Handout    Comprehension  Verbalized understanding       PT Short Term Goals - 06/12/17 1558      PT SHORT TERM GOAL #1   Title  formal balance testing to be completed with LTGs to be set    Status  New    Target Date  06/26/17        PT Long Term Goals - 06/12/17 1557      PT LONG TERM GOAL #1   Title  independent with HEP  Status  New    Target Date  07/24/17      PT LONG TERM GOAL #2   Title  report 50% improvement in dizziness symptoms for improved function    Status  New    Target Date  07/24/17      PT LONG TERM GOAL #3   Title  improve cervical ROM by 15% in limited motions for improved function    Status  New    Target Date  07/24/17      PT LONG TERM GOAL #4   Title  amb > 300' with LRAD modified independent on indoor/paved outdoor surfaces for improved function    Status  New    Target Date  07/24/17             Plan - 06/12/17 1552    Clinical Impression Statement  Pt is a 84 y/o female who presents to OPPT for c/o vertigo and neck pain.  Pt demonstrates ROM limitations with trigger points noted in upper trap and levator on Lt side, as well as possible BPPV.  Rt sidelying test demonstrated mild upbeating rotary nystagmus but pt denied any symptoms.  Pt also c/o lightheadedness with positional changes and feel orthostatic hypotension may be presents, and will plan to check next  visit.  Pt likely with multifactorial dizziness and will plan to address deficits listed.    History and Personal Factors relevant to plan of care:  afib; macular degeneration, spinal stenosis cervical and lumbar spine    Clinical Presentation  Evolving    Clinical Presentation due to:  multifactorial cause of symptoms, inconsistent symptoms    Clinical Decision Making  Moderate    Rehab Potential  Fair    PT Frequency  2x / week    PT Duration  6 weeks    PT Treatment/Interventions  ADLs/Self Care Home Management;Canalith Repostioning;Cryotherapy;Electrical Stimulation;Moist Heat;Traction;Therapeutic exercise;Therapeutic activities;Functional mobility training;Stair training;Gait training;DME Instruction;Ultrasound;Balance training;Neuromuscular re-education;Patient/family education;Vestibular;Manual techniques;Taping;Dry needling;Passive range of motion    PT Next Visit Plan  review HEP, manual/modalities/DN PRN, check orthostatics, give gaze x 1 and add posture exercises to HEP    Consulted and Agree with Plan of Care  Patient       Patient will benefit from skilled therapeutic intervention in order to improve the following deficits and impairments:  Abnormal gait, Decreased balance, Decreased mobility, Difficulty walking, Pain, Impaired UE functional use, Increased fascial restricitons, Increased muscle spasms, Dizziness, Decreased range of motion, Postural dysfunction, Impaired flexibility, Hypomobility  Visit Diagnosis: Abnormal posture - Plan: PT plan of care cert/re-cert  Other abnormalities of gait and mobility - Plan: PT plan of care cert/re-cert  Dizziness and giddiness - Plan: PT plan of care cert/re-cert  Unsteadiness on feet - Plan: PT plan of care cert/re-cert     Problem List Patient Active Problem List   Diagnosis Date Noted  . Spinal stenosis in cervical region 04/24/2017  . Spinal stenosis of lumbar region 04/24/2017  . Dizziness 04/24/2017  . Ureteral stone with  hydronephrosis 01/20/2017  . Atrial fibrillation with RVR ([place]) 02/24/2016  . CKD (chronic kidney disease) stage 3, GFR 30-59 ml/min (H[facility]) 02/24/2016  . Atrial fibrillation ([place]) 02/24/2016  . Exudative macular degeneration ([place]) 02/24/2016  . CHOLELITHIASIS 02/14/2009  . HYPERLIPIDEMIA 12/29/2008  . Essential hypertension 12/29/2008  . MYOCARDIAL INFARCTION, HX OF 12/29/2008  . CAD (coronary artery disease) 12/29/2008      Laureen Abrahams, PT, DPT 06/12/17 4:03 PM      [facility]  [facility] [address] [place], [place], [address] Phone: [phone]   Fax:  [phone]  Name: Shelly Obrien MRN: [id number] Date of Birth: 02/08/1923

## 2017-06-12 NOTE — Patient Instructions (Signed)
Supine Push    Take a deep breath and exhale while pushing the back of neck down on the bed. Hold __5__ seconds. Repeat __10__ times. Do __1-2__ sessions per day.  Upper Cervical Rotation    Rotate head slowly from side to side as if saying "no". Do not turn head completely to either side. Keep motion small.  Hold for 5-10 seconds in each position. Repeat __10__ times per set. Do __1__ sets per session. Do __1-2__ sessions per day.   Scapular Retraction (Standing)    You can do this exercise sitting or standing. With arms at sides, pinch shoulder blades together.  Hold for 5 seconds.  "Try to put your elbows in your back pocket." Repeat _10___ times per set. Do __1__ sets per session. Do __1-2__ sessions per day.    Trigger Point Dry Needling  . What is Trigger Point Dry Needling (DN)? o DN is a physical therapy technique used to treat muscle pain and dysfunction. Specifically, DN helps deactivate muscle trigger points (muscle knots).  o A thin filiform needle is used to penetrate the skin and stimulate the underlying trigger point. The goal is for a local twitch response (LTR) to occur and for the trigger point to relax. No medication of any kind is injected during the procedure.   . What Does Trigger Point Dry Needling Feel Like?  o The procedure feels different for each individual patient. Some patients report that they do not actually feel the needle enter the skin and overall the process is not painful. Very mild bleeding may occur. However, many patients feel a deep cramping in the muscle in which the needle was inserted. This is the local twitch response.   [name] How Will I feel after the treatment? o Soreness is normal, and the onset of soreness may not occur for a few hours. Typically this soreness does not last longer than two days.  o Bruising is uncommon, however; ice can be used to decrease any possible bruising.  o In rare cases feeling tired or nauseous after the  treatment is normal. In addition, your symptoms may get worse before they get better, this period will typically not last longer than 24 hours.   . What Can I do After My Treatment? o Increase your hydration by drinking more water for the next 24 hours. o You may place ice or heat on the areas treated that have become sore, however, do not use heat on inflamed or bruised areas. Heat often brings more relief post needling. o You can continue your regular activities, but vigorous activity is not recommended initially after the treatment for 24 hours. o DN is best combined with other physical therapy such as strengthening, stretching, and other therapies.

## 2017-06-17 DIAGNOSIS — R21 Rash and other nonspecific skin eruption: Secondary | ICD-10-CM | POA: Diagnosis not present

## 2017-06-17 DIAGNOSIS — L299 Pruritus, unspecified: Secondary | ICD-10-CM | POA: Diagnosis not present

## 2017-06-18 DIAGNOSIS — M5412 Radiculopathy, cervical region: Secondary | ICD-10-CM | POA: Diagnosis not present

## 2017-06-19 ENCOUNTER — Ambulatory Visit: Payer: Medicare Other | Admitting: Physical Therapy

## 2017-06-19 ENCOUNTER — Encounter: Payer: Self-pay | Admitting: Physical Therapy

## 2017-06-19 DIAGNOSIS — R42 Dizziness and giddiness: Secondary | ICD-10-CM

## 2017-06-19 DIAGNOSIS — R2681 Unsteadiness on feet: Secondary | ICD-10-CM

## 2017-06-19 DIAGNOSIS — R2689 Other abnormalities of gait and mobility: Secondary | ICD-10-CM

## 2017-06-19 DIAGNOSIS — R293 Abnormal posture: Secondary | ICD-10-CM

## 2017-06-19 NOTE — Therapy (Signed)
[facility] [address] [place], [place], [address] Phone: [phone]   Fax:  [phone]  Physical Therapy Treatment  Patient Details  Name: Shelly Obrien MRN: [id number] Date of Birth: August 14, 1922 Referring Provider: Melvenia Beam, MD   Encounter Date: 06/19/2017  PT End of Session - 06/19/17 1141    Visit Number  2    Number of Visits  12    Date for PT Re-Evaluation  07/24/17    Authorization Type  Medicare    PT Start Time  0930    PT Stop Time  1010    PT Time Calculation (min)  40 min    Activity Tolerance  Patient tolerated treatment well    Behavior During Therapy  [facility] for tasks assessed/performed       Past Medical History:  Diagnosis Date  . Anticoagulant long-term use    xarelto  . CAD S/P percutaneous coronary angioplasty cardiologist-  dr Dorris Carnes (previous cardiologist- dr Lia Foyer)   hx MI 04/ 1994  s/p  PCI to LAD  . CKD (chronic kidney disease), stage III ([place])   . Complication of anesthesia    hard to wake   . History of basal cell carcinoma (BCC) excision    right eye area 08/ 2013  . History of kidney stones 01/20/2017  . History of MI (myocardial infarction) 09/1992   s/p  PCI to LAD  . History of squamous cell carcinoma excision    right lower leg 2015 ;  2016  . Hyperlipidemia   . Hypertension   . Macular degeneration   . PAF (paroxysmal atrial fibrillation) ([place]) dx 02-24-2016   followed by cardiologist-- dr Mamie Nick. [name]  . PONV (postoperative nausea and vomiting)   . Right ureteral stone   . Wears glasses     Past Surgical History:  Procedure Laterality Date  . ABDOMINAL HYSTERECTOMY    . BREAST EXCISIONAL BIOPSY Left 11/02/2009   fibroadenoma (lumpectomy)  . BROW LIFT Right 04/07/2014   Procedure: REPAIR OF ENTROPION AND TRICHIASIS OF RIGHT LOWER EYE LID ;  Surgeon: Theodoro Kos, DO;  Location: [facility];  Service: Plastics;  Laterality: Right;  .  CATARACT EXTRACTION W/ INTRAOCULAR LENS  IMPLANT, BILATERAL    . CORONARY ANGIOPLASTY  04/ 1994  dr Lia Foyer   PCI to LAD  . CYSTOSCOPY W/ URETERAL STENT PLACEMENT Right 01/20/2017   Procedure: CYSTOSCOPY WITH RIGHT RETROGRADE PYELOGRAM/RIGHT URETERAL STENT PLACEMENT;  Surgeon: Alexis Frock, MD;  [facility];  Service: Urology;  Laterality: Right;  . CYSTOSCOPY WITH RETROGRADE PYELOGRAM, URETEROSCOPY AND STENT PLACEMENT Right 02/15/2017   Procedure: CYSTOSCOPY WITH RETROGRADE PYELOGRAM, URETEROSCOPY, STONE BASKETRY AND STENT REPLACEMENT;  Surgeon: Alexis Frock, MD;  Location: [facility];  Service: Urology;  Laterality: Right;  . CYSTOSCOPY/RETROGRADE/URETEROSCOPY/STONE EXTRACTION WITH BASKET  yrs ago  . DILATION AND CURETTAGE OF UTERUS    . HOLMIUM LASER APPLICATION Right 9/73/5329   Procedure: HOLMIUM LASER APPLICATION;  Surgeon: Alexis Frock, MD;  Location[facility];  Service: Urology;  Laterality: Right;  . KNEE SURGERY     right  . NEUROPLASTY / TRANSPOSITION ULNAR NERVE AT ELBOW Left 02-23-2000    [facility]   and Left Carpal Tunnel Release  . TRANSTHORACIC ECHOCARDIOGRAM  02/26/2016   moderate focal basal and mild concentric LVH,  ef 55-60%,  akinesis of the basal-midanteroseptal, inferoseptal and apicalinferior myocardium, study not sufficient to evaluation diastolic funciton due to atrial fib./  trivial PR/  mild TR    There were no vitals filed for this visit.  Subjective Assessment - 06/19/17 0932    Subjective  "I've been to the doctor everyday this week." Went to PCP Monday, then follow up with Dr. Nelva Bush at [facility] for more injections.  Feels exercise is "making my neck hurt more."  Reports "it's not dizziness, its something comes up" (pointing to back of head).    Patient Stated Goals  improve dizziness and symptoms    Currently in Pain?  No/denies             Vestibular Assessment - 06/19/17 0942      Orthostatics   BP supine  (x 5 minutes)  160/95    HR supine (x 5 minutes)  59    BP standing (after 1 minute)  130/92    HR standing (after 1 minute)  69    BP standing (after 3 minutes)  168/85    HR standing (after 3 minutes)  62              [facility] Adult PT Treatment/Exercise - 06/19/17 1136      Self-Care   Self-Care  Other Self-Care Comments    Other Self-Care Comments   educated on orthostatic hypotension and PT recommendations to follow up with PCP and how it can contribute to dizziness      Manual Therapy   Manual Therapy  Soft tissue mobilization;Myofascial release    Manual therapy comments  pt supine    Soft tissue mobilization  Lt upper trap, levator, cervical paraspinals    Myofascial Release  suboccipital release; TPR to Lt levator and UT             PT Education - 06/19/17 1141    Education provided  Yes    Education Details  orthostatic hypotension    Person(s) Educated  Patient    Methods  Explanation;Handout    Comprehension  Verbalized understanding       PT Short Term Goals - 06/12/17 1558      PT SHORT TERM GOAL #1   Title  formal balance testing to be completed with LTGs to be set    Status  New    Target Date  06/26/17        PT Long Term Goals - 06/12/17 1557      PT LONG TERM GOAL #1   Title  independent with HEP    Status  New    Target Date  07/24/17      PT LONG TERM GOAL #2   Title  report 50% improvement in dizziness symptoms for improved function    Status  New    Target Date  07/24/17      PT LONG TERM GOAL #3   Title  improve cervical ROM by 15% in limited motions for improved function    Status  New    Target Date  07/24/17      PT LONG TERM GOAL #4   Title  amb > 300' with LRAD modified independent on indoor/paved outdoor surfaces for improved function    Status  New    Target Date  07/24/17            Plan - 06/19/17 1141    Clinical Impression Statement  Pt positive for orthostatic hypotension, and feel this may be  contributing to symptoms, but not all symptoms.  Pt continues to have significant neck tightness and feel  she will benefit from DN and will plan to trial at next appt with DN PT.  Will cotninue to benefit from PT to maximize function and address deficits.    PT Treatment/Interventions  ADLs/Self Care Home Management;Canalith Repostioning;Cryotherapy;Electrical Stimulation;Moist Heat;Traction;Therapeutic exercise;Therapeutic activities;Functional mobility training;Stair training;Gait training;DME Instruction;Ultrasound;Balance training;Neuromuscular re-education;Patient/family education;Vestibular;Manual techniques;Taping;Dry needling;Passive range of motion    PT Next Visit Plan  review HEP, manual/modalities/DN PRN, give gaze x 1 and add posture exercises to HEP, needs balance testing (BERG, DGI)    Consulted and Agree with Plan of Care  Patient       Patient will benefit from skilled therapeutic intervention in order to improve the following deficits and impairments:  Abnormal gait, Decreased balance, Decreased mobility, Difficulty walking, Pain, Impaired UE functional use, Increased fascial restricitons, Increased muscle spasms, Dizziness, Decreased range of motion, Postural dysfunction, Impaired flexibility, Hypomobility  Visit Diagnosis: Abnormal posture  Other abnormalities of gait and mobility  Dizziness and giddiness  Unsteadiness on feet     Problem List Patient Active Problem List   Diagnosis Date Noted  . Spinal stenosis in cervical region 04/24/2017  . Spinal stenosis of lumbar region 04/24/2017  . Dizziness 04/24/2017  . Ureteral stone with hydronephrosis 01/20/2017  . Atrial fibrillation with RVR ([place]) 02/24/2016  . CKD (chronic kidney disease) stage 3, GFR 30-59 ml/min ([facility]) 02/24/2016  . Atrial fibrillation ([place]) 02/24/2016  . Exudative macular degeneration ([place]) 02/24/2016  . CHOLELITHIASIS 02/14/2009  . HYPERLIPIDEMIA 12/29/2008  . Essential hypertension 12/29/2008   . MYOCARDIAL INFARCTION, HX OF 12/29/2008  . CAD (coronary artery disease) 12/29/2008      Laureen Abrahams, PT, DPT 06/19/17 11:44 AM     [facility] [address] [place], [place], [address] Phone: [phone]   Fax:  [phone]  Name: DEANE MELICK MRN: [id number] Date of Birth: [birth year]

## 2017-06-21 ENCOUNTER — Encounter: Payer: Self-pay | Admitting: Physical Therapy

## 2017-06-21 ENCOUNTER — Ambulatory Visit: Payer: Medicare Other | Admitting: Physical Therapy

## 2017-06-21 VITALS — BP 153/86 | HR 66

## 2017-06-21 DIAGNOSIS — R2681 Unsteadiness on feet: Secondary | ICD-10-CM

## 2017-06-21 DIAGNOSIS — R2689 Other abnormalities of gait and mobility: Secondary | ICD-10-CM

## 2017-06-21 DIAGNOSIS — R293 Abnormal posture: Secondary | ICD-10-CM | POA: Diagnosis not present

## 2017-06-21 DIAGNOSIS — R42 Dizziness and giddiness: Secondary | ICD-10-CM

## 2017-06-21 NOTE — Patient Instructions (Signed)
Feet Together, Head Motion - Eyes Open    With eyes open, feet together, move head slowly: up and down 10 times, side to side 10 times.   Repeat 2 times per session. Do 2 sessions per day.

## 2017-06-21 NOTE — Therapy (Signed)
[facility] [address] [place], [place], [address] Phone: [phone]   Fax:  [phone]  Physical Therapy Treatment  Patient Details  Name: Shelly Obrien MRN: [id number] Date of Birth: 1923/03/20 Referring Provider: Melvenia Beam, MD   Encounter Date: 06/21/2017  PT End of Session - 06/21/17 1622    Visit Number  3    Number of Visits  12    Date for PT Re-Evaluation  07/24/17    Authorization Type  Medicare    PT Start Time  5102    PT Stop Time  1615    PT Time Calculation (min)  45 min    Activity Tolerance  Patient tolerated treatment well    Behavior During Therapy  [facility] for tasks assessed/performed       Past Medical History:  Diagnosis Date  . Anticoagulant long-term use    xarelto  . CAD S/P percutaneous coronary angioplasty cardiologist-  dr Dorris Carnes (previous cardiologist- dr Lia Foyer)   hx MI 04/ 1994  s/p  PCI to LAD  . CKD (chronic kidney disease), stage III ([place])   . Complication of anesthesia    hard to wake   . History of basal cell carcinoma (BCC) excision    right eye area 08/ 2013  . History of kidney stones 01/20/2017  . History of MI (myocardial infarction) 09/1992   s/p  PCI to LAD  . History of squamous cell carcinoma excision    right lower leg 2015 ;  2016  . Hyperlipidemia   . Hypertension   . Macular degeneration   . PAF (paroxysmal atrial fibrillation) ([place]) dx 02-24-2016   followed by cardiologist-- dr Mamie Nick. [name]  . PONV (postoperative nausea and vomiting)   . Right ureteral stone   . Wears glasses     Past Surgical History:  Procedure Laterality Date  . ABDOMINAL HYSTERECTOMY    . BREAST EXCISIONAL BIOPSY Left 11/02/2009   fibroadenoma (lumpectomy)  . BROW LIFT Right 04/07/2014   Procedure: REPAIR OF ENTROPION AND TRICHIASIS OF RIGHT LOWER EYE LID ;  Surgeon: Theodoro Kos, DO;  Location: [facility];  Service: Plastics;  Laterality: Right;  .  CATARACT EXTRACTION W/ INTRAOCULAR LENS  IMPLANT, BILATERAL    . CORONARY ANGIOPLASTY  04/ 1994  dr Lia Foyer   PCI to LAD  . CYSTOSCOPY W/ URETERAL STENT PLACEMENT Right 01/20/2017   Procedure: CYSTOSCOPY WITH RIGHT RETROGRADE PYELOGRAM/RIGHT URETERAL STENT PLACEMENT;  Surgeon: Alexis Frock, MD;  Location: [facility];  Service: Urology;  Laterality: Right;  . CYSTOSCOPY WITH RETROGRADE PYELOGRAM, URETEROSCOPY AND STENT PLACEMENT Right 02/15/2017   Procedure: CYSTOSCOPY WITH RETROGRADE PYELOGRAM, URETEROSCOPY, STONE BASKETRY AND STENT REPLACEMENT;  Surgeon: Alexis Frock, MD;  Location: [facility];  Service: Urology;  Laterality: Right;  . CYSTOSCOPY/RETROGRADE/URETEROSCOPY/STONE EXTRACTION WITH BASKET  yrs ago  . DILATION AND CURETTAGE OF UTERUS    . HOLMIUM LASER APPLICATION Right 5/85/2778   Procedure: HOLMIUM LASER APPLICATION;  Surgeon: Alexis Frock, MD;  Location: [facility];  Service: Urology;  Laterality: Right;  . KNEE SURGERY     right  . NEUROPLASTY / TRANSPOSITION ULNAR NERVE AT ELBOW Left 02-23-2000    [facility]   and Left Carpal Tunnel Release  . TRANSTHORACIC ECHOCARDIOGRAM  02/26/2016   moderate focal basal and mild concentric LVH,  ef 55-60%,  akinesis of the basal-midanteroseptal, inferoseptal and apicalinferior myocardium, study not sufficient to evaluation diastolic funciton due to atrial fib./  trivial PR/  mild TR    Vitals:   06/21/17 1544  BP: (!) 153/86  Pulse: 66    Subjective Assessment - 06/21/17 1540    Subjective  No increase in dizziness, neck is still bothering her but feels that the stretches and therapy have given her a little bit more ROM.  Asking "what type of shot is going to give me on Tuesday."    Patient Stated Goals  improve dizziness and symptoms    Currently in Pain?  No/denies         [facility] PT Assessment - 06/21/17 1545      Balance   Balance Assessed  Yes      Standardized Balance Assessment   Standardized  Balance Assessment  Berg Balance Test;Dynamic Gait Index      Berg Balance Test   Sit to Stand  Able to stand without using hands and stabilize independently    Standing Unsupported  Able to stand safely 2 minutes    Sitting with Back Unsupported but Feet Supported on Floor or Stool  Able to sit safely and securely 2 minutes    Stand to Sit  Sits safely with minimal use of hands    Transfers  Able to transfer safely, minor use of hands    Standing Unsupported with Eyes Closed  Able to stand 10 seconds safely    Standing Ubsupported with Feet Together  Able to place feet together independently and stand for 1 minute with supervision    From Standing, Reach Forward with Outstretched Arm  Can reach confidently >25 cm (10")    From Standing Position, Pick up Object from Floor  Able to pick up shoe safely and easily    From Standing Position, Turn to Look Behind Over each Shoulder  Looks behind one side only/other side shows less weight shift    Turn 360 Degrees  Able to turn 360 degrees safely in 4 seconds or less    Standing Unsupported, Alternately Place Feet on Step/Stool  Able to complete 4 steps without aid or supervision    Standing Unsupported, One Foot in Front  Able to take small step independently and hold 30 seconds    Standing on One Leg  Tries to lift leg/unable to hold 3 seconds but remains standing independently    Total Score  47    Berg comment:  47/56      Dynamic Gait Index   Level Surface  Mild Impairment    Change in Gait Speed  Normal    Gait with Horizontal Head Turns  Moderate Impairment    Gait with Vertical Head Turns  Moderate Impairment    Gait and Pivot Turn  Mild Impairment    Step Over Obstacle  Normal    Step Around Obstacles  Normal    Steps  Moderate Impairment    Total Score  16    DGI comment:  16/24; <19 falls risk     Patient demonstrates increased fall risk as noted by score of 47/56 on Berg Balance Scale.  (<36= high risk for falls, close to 100%;  37-45 significant >80%; 46-51 moderate >50%; 52-55 lower >25%)                   Balance Exercises - 06/21/17 1609      Balance Exercises: Standing   Standing Eyes Opened  Narrow base of support (BOS);Head turns;Solid surface;Other reps (comment) 10 reps each head nods and turns  PT Education - 06/21/17 1617    Education provided  Yes    Education Details  balance findings, corner balance exercise    Person(s) Educated  Patient    Methods  Explanation;Demonstration    Comprehension  Verbalized understanding;Returned demonstration       PT Short Term Goals - 06/21/17 1636      PT SHORT TERM GOAL #1   Title  formal balance testing to be completed with LTGs to be set    Status  Achieved        PT Long Term Goals - 06/21/17 1636      PT LONG TERM GOAL #1   Title  independent with HEP    Status  New    Target Date  07/24/17      PT LONG TERM GOAL #2   Title  report 50% improvement in dizziness symptoms for improved function    Status  New    Target Date  07/24/17      PT LONG TERM GOAL #3   Title  improve cervical ROM by 15% in limited motions for improved function    Status  New    Target Date  07/24/17      PT LONG TERM GOAL #4   Title  amb > 300' with LRAD modified independent on indoor/paved outdoor surfaces for improved function    Status  New    Target Date  07/24/17      PT LONG TERM GOAL #5   Title  Pt will decrease falls risk with increase in BERG score to >/= 52/56    Baseline  47/56    Status  New    Target Date  07/24/17      Additional Long Term Goals   Additional Long Term Goals  Yes      PT LONG TERM GOAL #6   Title  Pt will increase DGI score to >/= 19/24 for decreased falls risk during gait    Baseline  16/24    Status  New    Target Date  07/24/17            Plan - 06/21/17 1623    Clinical Impression Statement  Treatment session today with further assessment of standing balance, balance during gait and falls  risk with BERG and DGI.  Based on results of balance assessments pt is currently at moderate risk for falls with greatest difficulty maintaining balance during gait with head turns and standing with more narrow BOS.  Initiated corner balance exercises with head turns within available range and to pt tolerance.  Pt tolerated exercise well.  Will continue to progress towards LTG.    PT Treatment/Interventions  ADLs/Self Care Home Management;Canalith Repostioning;Cryotherapy;Electrical Stimulation;Moist Heat;Traction;Therapeutic exercise;Therapeutic activities;Functional mobility training;Stair training;Gait training;DME Instruction;Ultrasound;Balance training;Neuromuscular re-education;Patient/family education;Vestibular;Manual techniques;Taping;Dry needling;Passive range of motion    PT Next Visit Plan  Completed BERG and DGI -corner balance exercise added; need to review HEP, manual/modalities/DN PRN, give gaze x 1 and add posture exercises to HEP    Consulted and Agree with Plan of Care  Patient       Patient will benefit from skilled therapeutic intervention in order to improve the following deficits and impairments:  Abnormal gait, Decreased balance, Decreased mobility, Difficulty walking, Pain, Impaired UE functional use, Increased fascial restricitons, Increased muscle spasms, Dizziness, Decreased range of motion, Postural dysfunction, Impaired flexibility, Hypomobility  Visit Diagnosis: Abnormal posture  Other abnormalities of gait and mobility  Dizziness and giddiness  Unsteadiness on  feet     Problem List Patient Active Problem List   Diagnosis Date Noted  . Spinal stenosis in cervical region 04/24/2017  . Spinal stenosis of lumbar region 04/24/2017  . Dizziness 04/24/2017  . Ureteral stone with hydronephrosis 01/20/2017  . Atrial fibrillation with RVR ([place]) 02/24/2016  . CKD (chronic kidney disease) stage 3, GFR 30-59 ml/min ([facility]) 02/24/2016  . Atrial fibrillation ([place])  02/24/2016  . Exudative macular degeneration ([place]) 02/24/2016  . CHOLELITHIASIS 02/14/2009  . HYPERLIPIDEMIA 12/29/2008  . Essential hypertension 12/29/2008  . MYOCARDIAL INFARCTION, HX OF 12/29/2008  . CAD (coronary artery disease) 12/29/2008    Rico Junker, PT, DPT 06/21/17    4:43 PM    [facility] [address] [place], [place], [address] Phone: [phone]   Fax:  [phone]  Name: TIARI ANDRINGA MRN: [id number] Date of Birth: [birth year]

## 2017-06-24 ENCOUNTER — Ambulatory Visit: Payer: Medicare Other | Admitting: Physical Therapy

## 2017-06-24 ENCOUNTER — Encounter: Payer: Self-pay | Admitting: Physical Therapy

## 2017-06-24 DIAGNOSIS — R2681 Unsteadiness on feet: Secondary | ICD-10-CM

## 2017-06-24 DIAGNOSIS — R42 Dizziness and giddiness: Secondary | ICD-10-CM

## 2017-06-24 DIAGNOSIS — R2689 Other abnormalities of gait and mobility: Secondary | ICD-10-CM

## 2017-06-24 DIAGNOSIS — R293 Abnormal posture: Secondary | ICD-10-CM | POA: Diagnosis not present

## 2017-06-24 NOTE — Therapy (Signed)
[facility] [address] [place], [place], [address] Phone: [phone]   Fax:  [phone]  Physical Therapy Treatment  Patient Details  Name: Shelly Obrien MRN: [id number] Date of Birth: 30-Nov-1922 Referring Provider: Melvenia Beam, MD   Encounter Date: 06/24/2017  PT End of Session - 06/24/17 1358    Visit Number  4    Number of Visits  12    Date for PT Re-Evaluation  07/24/17    Authorization Type  Medicare    PT Start Time  3532    PT Stop Time  1558    PT Time Calculation (min)  43 min    Activity Tolerance  Patient tolerated treatment well    Behavior During Therapy  [facility] for tasks assessed/performed       Past Medical History:  Diagnosis Date  . Anticoagulant long-term use    xarelto  . CAD S/P percutaneous coronary angioplasty cardiologist-  dr Dorris Carnes (previous cardiologist- dr Lia Foyer)   hx MI 04/ 1994  s/p  PCI to LAD  . CKD (chronic kidney disease), stage III ([place])   . Complication of anesthesia    hard to wake   . History of basal cell carcinoma (BCC) excision    right eye area 08/ 2013  . History of kidney stones 01/20/2017  . History of MI (myocardial infarction) 09/1992   s/p  PCI to LAD  . History of squamous cell carcinoma excision    right lower leg 2015 ;  2016  . Hyperlipidemia   . Hypertension   . Macular degeneration   . PAF (paroxysmal atrial fibrillation) ([place]) dx 02-24-2016   followed by cardiologist-- dr Mamie Nick. [name]  . PONV (postoperative nausea and vomiting)   . Right ureteral stone   . Wears glasses     Past Surgical History:  Procedure Laterality Date  . ABDOMINAL HYSTERECTOMY    . BREAST EXCISIONAL BIOPSY Left 11/02/2009   fibroadenoma (lumpectomy)  . BROW LIFT Right 04/07/2014   Procedure: REPAIR OF ENTROPION AND TRICHIASIS OF RIGHT LOWER EYE LID ;  Surgeon: Theodoro Kos, DO;  Location: [facility];  Service: Plastics;  Laterality: Right;  .  CATARACT EXTRACTION W/ INTRAOCULAR LENS  IMPLANT, BILATERAL    . CORONARY ANGIOPLASTY  04/ 1994  dr Lia Foyer   PCI to LAD  . CYSTOSCOPY W/ URETERAL STENT PLACEMENT Right 01/20/2017   Procedure: CYSTOSCOPY WITH RIGHT RETROGRADE PYELOGRAM/RIGHT URETERAL STENT PLACEMENT;  Surgeon: Alexis Frock, MD;  Location[facility];  Service: Urology;  Laterality: Right;  . CYSTOSCOPY WITH RETROGRADE PYELOGRAM, URETEROSCOPY AND STENT PLACEMENT Right 02/15/2017   Procedure: CYSTOSCOPY WITH RETROGRADE PYELOGRAM, URETEROSCOPY, STONE BASKETRY AND STENT REPLACEMENT;  Surgeon: Alexis Frock, MD;  Location: [facility];  Service: Urology;  Laterality: Right;  . CYSTOSCOPY/RETROGRADE/URETEROSCOPY/STONE EXTRACTION WITH BASKET  yrs ago  . DILATION AND CURETTAGE OF UTERUS    . HOLMIUM LASER APPLICATION Right 9/92/4268   Procedure: HOLMIUM LASER APPLICATION;  Surgeon: Alexis Frock, MD;  Location: [facility];  Service: Urology;  Laterality: Right;  . KNEE SURGERY     right  . NEUROPLASTY / TRANSPOSITION ULNAR NERVE AT ELBOW Left 02-23-2000    [facility]   and Left Carpal Tunnel Release  . TRANSTHORACIC ECHOCARDIOGRAM  02/26/2016   moderate focal basal and mild concentric LVH,  ef 55-60%,  akinesis of the basal-midanteroseptal, inferoseptal and apicalinferior myocardium, study not sufficient to evaluation diastolic funciton due to atrial fib./  trivial PR/  mild TR    There were no vitals filed for this visit.  Subjective Assessment - 06/24/17 1317    Subjective  "I really believe this is vertigo, and when the wind blows in my ears, it causes that."    Patient Stated Goals  improve dizziness and symptoms    Currently in Pain?  No/denies c/o "catching" with movement; but improves with movement                      [facility] Adult PT Treatment/Exercise - 06/24/17 0001      Neck Exercises: Seated   Shoulder Rolls  Backwards;15 reps      Neck Exercises: Supine   Neck Retraction   10 reps;5 secs    Cervical Rotation  Both;5 reps 5 sec hold      Manual Therapy   Manual Therapy  Soft tissue mobilization;Myofascial release    Manual therapy comments  pt supine    Soft tissue mobilization  Lt upper trap, levator, cervical paraspinals; SCM    Myofascial Release  suboccipital release; TPR to Lt levator and UT as well as SCM      Neck Exercises: Stretches   Upper Trapezius Stretch  Left;3 reps;30 seconds Rt ear to Rt shoulder       Trigger Point Dry Needling - 06/24/17 1346    Consent Given?  Yes    Education Handout Provided  Yes    Muscles Treated Upper Body  Upper trapezius pt supine; unable to lie prone    Upper Trapezius Response  Twitch reponse elicited;Palpable increased muscle length           PT Education - 06/24/17 1358    Education provided  Yes    Education Details  UT stretch, backwards shoulder rolls    Person(s) Educated  Patient    Methods  Explanation;Demonstration;Handout    Comprehension  Verbalized understanding;Returned demonstration       PT Short Term Goals - 06/21/17 1636      PT SHORT TERM GOAL #1   Title  formal balance testing to be completed with LTGs to be set    Status  Achieved        PT Long Term Goals - 06/21/17 1636      PT LONG TERM GOAL #1   Title  independent with HEP    Status  New    Target Date  07/24/17      PT LONG TERM GOAL #2   Title  report 50% improvement in dizziness symptoms for improved function    Status  New    Target Date  07/24/17      PT LONG TERM GOAL #3   Title  improve cervical ROM by 15% in limited motions for improved function    Status  New    Target Date  07/24/17      PT LONG TERM GOAL #4   Title  amb > 300' with LRAD modified independent on indoor/paved outdoor surfaces for improved function    Status  New    Target Date  07/24/17      PT LONG TERM GOAL #5   Title  Pt will decrease falls risk with increase in BERG score to >/= 52/56    Baseline  47/56    Status  New     Target Date  07/24/17      Additional Long Term Goals   Additional Long Term Goals  Yes  PT LONG TERM GOAL #6   Title  Pt will increase DGI score to >/= 19/24 for decreased falls risk during gait    Baseline  16/24    Status  New    Target Date  07/24/17            Plan - 06/24/17 1358    Clinical Impression Statement  Pt tolerated DN well today with good twitch responses and decreased active trigger points following.  Progressing well towards goals.    PT Treatment/Interventions  ADLs/Self Care Home Management;Canalith Repostioning;Cryotherapy;Electrical Stimulation;Moist Heat;Traction;Therapeutic exercise;Therapeutic activities;Functional mobility training;Stair training;Gait training;DME Instruction;Ultrasound;Balance training;Neuromuscular re-education;Patient/family education;Vestibular;Manual techniques;Taping;Dry needling;Passive range of motion    PT Next Visit Plan  corner balance exercise added; need to review HEP, manual/modalities/DN PRN, give gaze x 1 and add posture exercises to HEP; assess response to DN    Consulted and Agree with Plan of Care  Patient       Patient will benefit from skilled therapeutic intervention in order to improve the following deficits and impairments:  Abnormal gait, Decreased balance, Decreased mobility, Difficulty walking, Pain, Impaired UE functional use, Increased fascial restricitons, Increased muscle spasms, Dizziness, Decreased range of motion, Postural dysfunction, Impaired flexibility, Hypomobility  Visit Diagnosis: Abnormal posture  Other abnormalities of gait and mobility  Dizziness and giddiness  Unsteadiness on feet     Problem List Patient Active Problem List   Diagnosis Date Noted  . Spinal stenosis in cervical region 04/24/2017  . Spinal stenosis of lumbar region 04/24/2017  . Dizziness 04/24/2017  . Ureteral stone with hydronephrosis 01/20/2017  . Atrial fibrillation with RVR ([place]) 02/24/2016  . CKD (chronic  kidney disease) stage 3, GFR 30-59 ml/min ([facility]) 02/24/2016  . Atrial fibrillation ([place]) 02/24/2016  . Exudative macular degeneration ([place]) 02/24/2016  . CHOLELITHIASIS 02/14/2009  . HYPERLIPIDEMIA 12/29/2008  . Essential hypertension 12/29/2008  . MYOCARDIAL INFARCTION, HX OF 12/29/2008  . CAD (coronary artery disease) 12/29/2008      Laureen Abrahams, PT, DPT 06/24/17 2:00 PM    [facility] [address] [place], [place], [address] Phone: [phone]   Fax:  [phone]  Name: Shelly Obrien MRN: [id number] Date of Birth: 1922/10/06

## 2017-06-24 NOTE — Patient Instructions (Signed)
Ear / Shoulder Stretch    Exhaling, move right ear toward right shoulder. Use right hand to gently pull on head for an additional stretch.  Hold position for _20-30__ seconds. Inhaling, bring head back to center.  Repeat sequence _2-3__ times. Do _2-3__ times per day.   Healthy Back - Shoulder Roll    Stand straight with arms relaxed at sides. Roll shoulders backward continuously. Do __10-15__ times.

## 2017-06-26 ENCOUNTER — Ambulatory Visit: Payer: Medicare Other | Admitting: Physical Therapy

## 2017-06-26 ENCOUNTER — Encounter (HOSPITAL_COMMUNITY): Payer: Self-pay | Admitting: Emergency Medicine

## 2017-06-26 ENCOUNTER — Emergency Department (HOSPITAL_COMMUNITY): Payer: Medicare Other

## 2017-06-26 ENCOUNTER — Emergency Department (HOSPITAL_COMMUNITY)
Admission: EM | Admit: 2017-06-26 | Discharge: 2017-06-26 | Disposition: A | Discharge status: Home / Self Care | Payer: Medicare Other | Attending: Emergency Medicine | Admitting: Emergency Medicine

## 2017-06-26 ENCOUNTER — Encounter: Payer: Self-pay | Admitting: Physical Therapy

## 2017-06-26 VITALS — BP 127/81 | HR 78

## 2017-06-26 DIAGNOSIS — R2681 Unsteadiness on feet: Secondary | ICD-10-CM

## 2017-06-26 DIAGNOSIS — I10 Essential (primary) hypertension: Secondary | ICD-10-CM | POA: Diagnosis not present

## 2017-06-26 DIAGNOSIS — J9811 Atelectasis: Secondary | ICD-10-CM | POA: Diagnosis not present

## 2017-06-26 DIAGNOSIS — I4891 Unspecified atrial fibrillation: Secondary | ICD-10-CM

## 2017-06-26 DIAGNOSIS — I2510 Atherosclerotic heart disease of native coronary artery without angina pectoris: Secondary | ICD-10-CM | POA: Diagnosis not present

## 2017-06-26 DIAGNOSIS — I480 Paroxysmal atrial fibrillation: Secondary | ICD-10-CM

## 2017-06-26 DIAGNOSIS — R293 Abnormal posture: Secondary | ICD-10-CM

## 2017-06-26 DIAGNOSIS — R0609 Other forms of dyspnea: Secondary | ICD-10-CM | POA: Insufficient documentation

## 2017-06-26 DIAGNOSIS — R2689 Other abnormalities of gait and mobility: Secondary | ICD-10-CM

## 2017-06-26 DIAGNOSIS — N183 Chronic kidney disease, stage 3 (moderate): Secondary | ICD-10-CM | POA: Insufficient documentation

## 2017-06-26 DIAGNOSIS — I129 Hypertensive chronic kidney disease with stage 1 through stage 4 chronic kidney disease, or unspecified chronic kidney disease: Secondary | ICD-10-CM | POA: Diagnosis not present

## 2017-06-26 DIAGNOSIS — R0600 Dyspnea, unspecified: Secondary | ICD-10-CM

## 2017-06-26 DIAGNOSIS — R42 Dizziness and giddiness: Secondary | ICD-10-CM

## 2017-06-26 DIAGNOSIS — R531 Weakness: Secondary | ICD-10-CM | POA: Diagnosis not present

## 2017-06-26 LAB — I-STAT TROPONIN, ED: Troponin i, poc: 0.02 ng/mL (ref 0.00–0.08)

## 2017-06-26 LAB — BASIC METABOLIC PANEL
Anion gap: 11 (ref 5–15)
BUN: 19 mg/dL (ref 6–20)
CO2: 22 mmol/L (ref 22–32)
Calcium: 9.8 mg/dL (ref 8.9–10.3)
Chloride: 108 mmol/L (ref 101–111)
Creatinine, Ser: 1.16 mg/dL — ABNORMAL HIGH (ref 0.44–1.00)
GFR calc Af Amer: 46 mL/min — ABNORMAL LOW (ref >60)
GFR calc non Af Amer: 40 mL/min — ABNORMAL LOW (ref >60)
Glucose, Bld: 113 mg/dL — ABNORMAL HIGH (ref 65–99)
Potassium: 4.8 mmol/L (ref 3.5–5.1)
Sodium: 141 mmol/L (ref 135–145)

## 2017-06-26 LAB — BRAIN NATRIURETIC PEPTIDE: B Natriuretic Peptide: 644.9 pg/mL — ABNORMAL HIGH (ref 0.0–100.0)

## 2017-06-26 LAB — HEPATIC FUNCTION PANEL
ALT: 15 U/L (ref 14–54)
AST: 28 U/L (ref 15–41)
Albumin: 3.6 g/dL (ref 3.5–5.0)
Alkaline Phosphatase: 113 U/L (ref 38–126)
Bilirubin, Direct: 0.2 mg/dL (ref 0.1–0.5)
Indirect Bilirubin: 0.6 mg/dL (ref 0.3–0.9)
Total Bilirubin: 0.8 mg/dL (ref 0.3–1.2)
Total Protein: 6.8 g/dL (ref 6.5–8.1)

## 2017-06-26 LAB — TSH: TSH: 0.968 u[IU]/mL (ref 0.350–4.500)

## 2017-06-26 LAB — CBC
HCT: 41.3 % (ref 36.0–46.0)
Hemoglobin: 12.4 g/dL (ref 12.0–15.0)
MCH: 25.7 pg — ABNORMAL LOW (ref 26.0–34.0)
MCHC: 30 g/dL (ref 30.0–36.0)
MCV: 85.7 fL (ref 78.0–100.0)
Platelets: 248 10*3/uL (ref 150–400)
RBC: 4.82 MIL/uL (ref 3.87–5.11)
RDW: 16.8 % — ABNORMAL HIGH (ref 11.5–15.5)
WBC: 7.4 10*3/uL (ref 4.0–10.5)

## 2017-06-26 LAB — MAGNESIUM: Magnesium: 2.1 mg/dL (ref 1.7–2.4)

## 2017-06-26 MED ORDER — METOPROLOL TARTRATE 5 MG/5ML IV SOLN
5.0000 mg | Freq: Once | INTRAVENOUS | Status: AC
  Administered 2017-06-26: 5 mg via INTRAVENOUS
  Filled 2017-06-26: qty 5

## 2017-06-26 MED ORDER — METOPROLOL TARTRATE 50 MG PO TABS: 50.0000 mg | ORAL_TABLET | Freq: Two times a day (BID) | ORAL | 5 refills | Status: DC

## 2017-06-26 MED ORDER — METOPROLOL TARTRATE 25 MG PO TABS
50.0000 mg | ORAL_TABLET | Freq: Once | ORAL | Status: AC
  Administered 2017-06-26: 50 mg via ORAL
  Filled 2017-06-26: qty 2

## 2017-06-26 MED ORDER — SODIUM CHLORIDE 0.9 % IV BOLUS (SEPSIS)
500.0000 mL | Freq: Once | INTRAVENOUS | Status: AC
  Administered 2017-06-26: 500 mL via INTRAVENOUS

## 2017-06-26 MED ORDER — DABIGATRAN ETEXILATE MESYLATE 150 MG PO CAPS: 150.0000 mg | ORAL_CAPSULE | Freq: Two times a day (BID) | ORAL | 11 refills | Status: DC

## 2017-06-26 MED ORDER — FUROSEMIDE 10 MG/ML IJ SOLN
20.0000 mg | Freq: Once | INTRAMUSCULAR | Status: AC
  Administered 2017-06-26: 20 mg via INTRAVENOUS
  Filled 2017-06-26: qty 2

## 2017-06-26 MED ORDER — DABIGATRAN ETEXILATE MESYLATE 150 MG PO CAPS: 150.0000 mg | ORAL_CAPSULE | Freq: Two times a day (BID) | ORAL | 0 refills | Status: DC

## 2017-06-26 NOTE — ED Triage Notes (Signed)
Pt to ER for shortness of breath and atrial fibrillation onset yesterday. States unable to walk long distances without feeling out of breath. O2 100%, atrial fibrillation rate at 115, all other vitals stable.

## 2017-06-26 NOTE — Therapy (Addendum)
[facility] [address] [place], [place], [address] Phone: [phone]   Fax:  [phone]  Patient Details  Name: Shelly Obrien MRN: [id number] Date of Birth: 1923-02-12 Referring Provider:  [name], MD  Encounter Date: 06/26/2017   Subjective Assessment - 06/26/17 1233    Subjective  pretty sore after DN; and reports her HR was up in the 130s.  feels like she can move her head more.     Patient Stated Goals  improve dizziness and symptoms    Currently in Pain?  No/denies       Vitals:   06/26/17 1238  BP: 127/81  Pulse: 78  SpO2: 97%    Pt arrived to session today reporting "racing heart" yesterday and increased SOB with activity.  HR monitored and noted irregular rate with 11 skipped beats in 1 min.  Given pt's hx of afib and noting symptoms, recommended pt go to ED for further evaluation.  Pt declined EMS transportation, and reported she would go directly to ED.    No PT performed today.  PT Long Term Goals - 06/21/17 1636      PT LONG TERM GOAL #1   Title  independent with HEP    Status  New    Target Date  07/24/17      PT LONG TERM GOAL #2   Title  report 50% improvement in dizziness symptoms for improved function    Status  New    Target Date  07/24/17      PT LONG TERM GOAL #3   Title  improve cervical ROM by 15% in limited motions for improved function    Status  New    Target Date  07/24/17      PT LONG TERM GOAL #4   Title  amb > 300' with LRAD modified independent on indoor/paved outdoor surfaces for improved function    Status  New    Target Date  07/24/17      PT LONG TERM GOAL #5   Title  Pt will decrease falls risk with increase in BERG score to >/= 52/56    Baseline  47/56    Status  New    Target Date  07/24/17      Additional Long Term Goals   Additional Long Term Goals  Yes      PT LONG TERM GOAL #6   Title  Pt will increase DGI score to >/= 19/24 for decreased falls  risk during gait    Baseline  16/24    Status  New    Target Date  07/24/17        Laureen Abrahams, PT, DPT 06/26/17 12:53 PM    [facility] [address] [place], [place], [address] Phone: [phone]   Fax:  [phone]       PHYSICAL THERAPY DISCHARGE SUMMARY  Visits from Start of Care: 4  Current functional level related to goals / functional outcomes: See above   Remaining deficits: Unknown, pt to ED after last session for a-fib and advised to stop PT   Education / Equipment: HEP  Plan: Patient agrees to discharge.  Patient goals were not met. Patient is being discharged due to a change in medical status.  ?????           Laureen Abrahams, PT, DPT 08/09/17 12:05 PM  [facility] [address] [place], [place] [address]  [phone] (office) [phone] (fax)

## 2017-06-26 NOTE — Consult Note (Signed)
Cardiology Consult Note   Patient ID: Shelly Obrien; MRN: [id number]; DOB: 11/12/1922   Admission date: 06/26/2017  Primary Care Provider: [name], MD Primary Cardiologist: Dorris Carnes, MD   Chief Complaint:  Dyspnea and Palpitations   Patient Profile:   Shelly Obrien is a 84 y.o. female with a history of CAD with remote PCI to the LAD, PAF and HTN, presenting to the ED with complaint of exertional dyspnea weakness, found to be in atrial fibrillation w/ RVR. Cardiology consulted by Dr. Laverta Baltimore, [facility] ED.   History of Present Illness:   Shelly Obrien is followed by Dr. [name]. She was diagnosed with atrial fibrillation in 2017. Echo at that time showed normal LVEF. Pt converted spontaneously and has since been management with rate control therapy with metoprolol. She has been prescribed Xarelto for a/c, however pt notes that she recently stopped this due to development of a rash, which she felt was related to Xarelto. Her PCP advised her to stop this 1 week ago. Rash improved. Pt was also previously on Eliquis and stopped due to GI complaints (loose stools).   Pt notes she was in her usual state of health until last week. She has been dealing with cervical neck pain and has been evaluated by orthopedics and neurosurgery. She was prescribed outpatient physical therapy. While walking into therapy today, she felt weak and had some exertional dyspnea. She felt like she was going to "give out". No chest pain. No dizziness. She told her PT who checked her pulse and HR and she was noted to be irregular and tachycardic. She was advised to come to ED.   On arrival, pt noted to be in atrial fibrillation w/RVR. EKG showed a ventricular rate at 112 bpm. BP is elevated. She reports full med compliance with metoprolol. Denies caffeine and ETOH. She admits that she does not drink enough water. No n/v/d. No fever, chills or dysuria.   K is NWL. H/H WNL. TSH, Mg and BNP pending. SCr 1.16, c/w baseline. CXR negative.  Pt given dose of IV metoprolol in the ED, 5 mg once. Also got bolus of sodium chloride. HR improved after dose of IV metoprolol, down into the 80s-90s. She is asymptomatic at rest. We discussed anticoagulation. Pt is willing to give Eliquis another try. Wants to avoid going back on Xarelto given rash.    Past Medical History:  Diagnosis Date  . Anticoagulant long-term use    xarelto  . CAD S/P percutaneous coronary angioplasty cardiologist-  dr Dorris Carnes (previous cardiologist- dr Lia Foyer)   hx MI 04/ 1994  s/p  PCI to LAD  . CKD (chronic kidney disease), stage III ([place])   . Complication of anesthesia    hard to wake   . History of basal cell carcinoma (BCC) excision    right eye area 08/ 2013  . History of kidney stones 01/20/2017  . History of MI (myocardial infarction) 09/1992   s/p  PCI to LAD  . History of squamous cell carcinoma excision    right lower leg 2015 ;  2016  . Hyperlipidemia   . Hypertension   . Macular degeneration   . PAF (paroxysmal atrial fibrillation) ([place]) dx 02-24-2016   followed by cardiologist-- dr Mamie Nick. [name]  . PONV (postoperative nausea and vomiting)   . Right ureteral stone   . Wears glasses     Past Surgical History:  Procedure Laterality Date  . ABDOMINAL HYSTERECTOMY    . BREAST EXCISIONAL BIOPSY Left 11/02/2009  fibroadenoma (lumpectomy)  . BROW LIFT Right 04/07/2014   Procedure: REPAIR OF ENTROPION AND TRICHIASIS OF RIGHT LOWER EYE LID ;  Surgeon: Theodoro Kos, DO;  Location: [facility];  Service: Plastics;  Laterality: Right;  . CATARACT EXTRACTION W/ INTRAOCULAR LENS  IMPLANT, BILATERAL    . CORONARY ANGIOPLASTY  04/ 1994  dr Lia Foyer   PCI to LAD  . CYSTOSCOPY W/ URETERAL STENT PLACEMENT Right 01/20/2017   Procedure: CYSTOSCOPY WITH RIGHT RETROGRADE PYELOGRAM/RIGHT URETERAL STENT PLACEMENT;  Surgeon: Alexis Frock, MD;  Location: [facility];  Service: Urology;  Laterality: Right;  . CYSTOSCOPY WITH RETROGRADE PYELOGRAM,  URETEROSCOPY AND STENT PLACEMENT Right 02/15/2017   Procedure: CYSTOSCOPY WITH RETROGRADE PYELOGRAM, URETEROSCOPY, STONE BASKETRY AND STENT REPLACEMENT;  Surgeon: Alexis Frock, MD;  Location: [facility];  Service: Urology;  Laterality: Right;  . CYSTOSCOPY/RETROGRADE/URETEROSCOPY/STONE EXTRACTION WITH BASKET  yrs ago  . DILATION AND CURETTAGE OF UTERUS    . HOLMIUM LASER APPLICATION Right 07/26/9796   Procedure: HOLMIUM LASER APPLICATION;  Surgeon: Alexis Frock, MD;  Location: [facility];  Service: Urology;  Laterality: Right;  . KNEE SURGERY     right  . NEUROPLASTY / TRANSPOSITION ULNAR NERVE AT ELBOW Left 02-23-2000    [facility]   and Left Carpal Tunnel Release  . TRANSTHORACIC ECHOCARDIOGRAM  02/26/2016   moderate focal basal and mild concentric LVH,  ef 55-60%,  akinesis of the basal-midanteroseptal, inferoseptal and apicalinferior myocardium, study not sufficient to evaluation diastolic funciton due to atrial fib./  trivial PR/  mild TR     Medications Prior to Admission: Prior to Admission medications   Medication Sig Start Date End Date Taking? Authorizing Provider  acetaminophen (TYLENOL) 325 MG tablet Take 650 mg by mouth every 6 (six) hours as needed for mild pain.   Yes [provider]  amLODipine (NORVASC) 2.5 MG tablet Take 2.5 mg by mouth daily.   Yes [provider]  metoprolol tartrate (LOPRESSOR) 25 MG tablet Take 1 tablet (25 mg total) by mouth 2 (two) times daily. 02/27/16  Yes Ghimire, Henreitta Leber, MD  Multiple Vitamins-Minerals (ICAPS AREDS 2) CAPS Take 1 capsule by mouth daily.   Yes [provider]  Polyethyl Glycol-Propyl Glycol (SYSTANE OP) Place 1 drop into both eyes daily as needed (itchy eyes).   Yes [provider]  Rivaroxaban (XARELTO) 15 MG TABS tablet Take 1 tablet (15 mg total) by mouth daily with supper. 10/15/16  Yes Fay Records, MD  triamcinolone (NASACORT ALLERGY 24HR) 55 MCG/ACT AERO nasal  inhaler Place 1-2 sprays into the nose 2 (two) times daily as needed (for rhinitis).    Yes [provider]  traMADol (ULTRAM) 50 MG tablet Take 1 tablet (50 mg total) by mouth every 6 (six) hours as needed for moderate pain or severe pain. Post-operatively. Patient not taking: Reported on 06/26/2017 02/15/17   Alexis Frock, MD     Allergies:    Allergies  Allergen Reactions  . Antihistamines, Chlorpheniramine-Type Other (See Comments)    Pt states that she passed out.   [name] Penicillins Anaphylaxis and Other (See Comments)    Has patient had a PCN reaction causing immediate rash, facial/tongue/throat swelling, SOB or lightheadedness with hypotension: Yes Has patient had a PCN reaction causing severe rash involving mucus membranes or skin necrosis: No Has patient had a PCN reaction that required hospitalization No Has patient had a PCN reaction occurring within the last 10 years: No If all of the above answers  are "NO", then may proceed with Cephalosporin use.  . Contrast Media [Iodinated Diagnostic Agents] Hives  . Sulfonamide Derivatives Other (See Comments)    Reaction:  Unknown   . Atorvastatin Other (See Comments)    Reaction:  Unknown   . Pheniramine Other (See Comments)    Pt states that she passed out.     Social History:   Social History   Socioeconomic History  . Marital status: Widowed    Spouse name: Not on file  . Number of children: 2  . Years of education: Not on file  . Highest education level: Not on file  Social Needs  . Financial resource strain: Not on file  . Food insecurity - worry: Not on file  . Food insecurity - inability: Not on file  . Transportation needs - medical: Not on file  . Transportation needs - non-medical: Not on file  Occupational History  . Occupation: Retired  Tobacco Use  . Smoking status: Never Smoker  . Smokeless tobacco: Never Used  Substance and Sexual Activity  . Alcohol use: No    Alcohol/week: 0.0 oz  . Drug use:  No  . Sexual activity: Not on file  Other Topics Concern  . Not on file  Social History Narrative   Lives at home    Family History:   The patient's family history includes Brain cancer in her son; Cancer in her unknown relative; Coronary artery disease in her unknown relative; Liver cancer in her daughter.    ROS:  Please see the history of present illness.  All other ROS reviewed and negative.     Physical Exam/Data:   Vitals:   06/26/17 1332  BP: (!) 135/97  Pulse: (!) 115  Resp: 18  Temp: 97.6 F (36.4 C)  TempSrc: Oral  SpO2: 97%   No intake or output data in the 24 hours ending 06/26/17 1551 There were no vitals filed for this visit. There is no height or weight on file to calculate BMI.  General:  Well nourished, well developed, in no acute distress, elderly  HEENT: normal Lymph: no adenopathy Neck: no JVD Endocrine:  No thryomegaly Vascular: No carotid bruits; FA pulses 2+ bilaterally without bruits  Cardiac:  Irregularly irregular rhythm, tachy rate  Lungs:  clear to auscultation bilaterally, no wheezing, rhonchi or rales  Abd: soft, nontender, no hepatomegaly  Ext: no edema Musculoskeletal:  No deformities, BUE and BLE strength normal and equal Skin: warm and dry  Neuro:  CNs 2-12 intact, no focal abnormalities noted Psych:  Normal affect    EKG:  The ECG that was done 02/26/16  was personally reviewed and demonstrates atrial fibrillation w/ RVR  Relevant CV Studies: 2D Echo 02/26/16 Study Conclusions  - Left ventricle: The cavity size was normal. There was moderate   focal basal and mild concentric hypertrophy. Systolic function   was normal. The estimated ejection fraction was in the range of   55% to 60%. There is akinesis of the basal-midanteroseptal and   inferoseptal myocardium. There is akinesis of the apicalinferior   myocardium.   Laboratory Data:  Chemistry Recent Labs  Lab 06/26/17 1335  NA 141  K 4.8  CL 108  CO2 22  GLUCOSE  113*  BUN 19  CREATININE 1.16*  CALCIUM 9.8  GFRNONAA 40*  GFRAA 46*  ANIONGAP 11    No results for input(s): PROT, ALBUMIN, AST, ALT, ALKPHOS, BILITOT in the last 168 hours. Hematology Recent Labs  Lab 06/26/17 1335  WBC 7.4  RBC 4.82  HGB 12.4  HCT 41.3  MCV 85.7  MCH 25.7*  MCHC 30.0  RDW 16.8*  PLT 248   Cardiac EnzymesNo results for input(s): TROPONINI in the last 168 hours. No results for input(s): TROPIPOC in the last 168 hours.  BNPNo results for input(s): BNP, PROBNP in the last 168 hours.  DDimer No results for input(s): DDIMER in the last 168 hours.  Radiology/Studies:  Dg Chest 2 View  Result Date: 06/26/2017 CLINICAL DATA:  Atrial fibrillation EXAM: CHEST  2 VIEW COMPARISON:  08/22/2016 FINDINGS: Heart size within normal limits. Negative for heart failure. Mild bibasilar atelectasis/scarring. Negative for pneumonia or effusion. IMPRESSION: No active cardiopulmonary disease. Electronically Signed   By: Franchot Gallo M.D.   On: 06/26/2017 15:01    Assessment and Plan:    1. Atrial Fibrillation w/ RVR:  Pt with prior h/o PAF, treated with metoprolol for rate control and Xarelto for a/c (recently d/ced by PCP 1 week ago due to rash). Pt now with new generalized weakness and mild exertional dyspnea. No resting symptoms. She denies CP. K, H/H WNL. TSH and Mg pending. SCr 1.16. Pt felt to be mildly dehydrated and started on IVFs in ED. CXR unremarkable. Afebrile. Volume appears stable. Dose of IV metoprolol was given in ED with good response. HR improved from the 110s to the 80s-90s. Pt is feeling better.   I've discussed with MD. Pt is stable for discharge from ED with close outpatient f/u in out [facility]. We will arrange f/u. We will give a dose of PO metoprolol prior to leaving ED. Give 50 mg PO now. We will have her increase her home metoprolol dose to 50 mg BID. Stop amlodipine for now, to allow BP room given increase in BB. Pt to monitor her BP  closely at home. We will start her on Pradaxa for a/c given intolerances to Eliquis and Xarelto. Based on [facility], recommend 150 mg BID.   2. CAD: h/o remote LAD PCI. Pt denies CP. Continue medical therapy.  F/u in [facility]. We will arrange.    For questions or updates, please contact [place] Please consult [email or web address] for contact info under Cardiology/STEMI.    Signed, Lyda Jester, PA-C  06/26/2017 3:51 PM

## 2017-06-26 NOTE — Discharge Instructions (Signed)
You were seen in the ED today with difficulty breathing. We are making some changes to your medications.  STOP TAKING:  Xarelto Amlodipine  Start Taking:  Pradaxa  Change the dose of Metoprolol: Now at 50 mg twice a day.   The Cardiology group will have you follow up in the a-fib clinic. Return to the ED with any new or worsening symptoms.

## 2017-06-26 NOTE — ED Notes (Signed)
Pt POA Shelly Obrien has been contacted and is on the way.   Pt also reports being taken off her anticoagulation yesterday

## 2017-06-26 NOTE — ED Provider Notes (Signed)
Emergency Department Provider Note   I have reviewed the triage vital signs and the nursing notes.   HISTORY  Chief Complaint Shortness of Breath and Atrial Fibrillation   HPI Shelly Obrien is a 84 y.o. female with PMH of CAD, CKD, PAF on Xarelto, HTN, and HLD since to the emergency department for evaluation of exertional dyspnea and generalized weakness worsening over the past several days.  Patient states that she has had intermittent atrial fibrillation and had her Xarelto discontinued by her cardiologist 1 week ago with concern for possible rash development.  Patient is noticed that at her rehab for neck discomfort she is been much more short of breath with exertion.  She has had some generalized weakness and just not feeling right.  She denies chest pain, palpitations, shortness of breath.  No other modifying factors.  She restarted her Xarelto yesterday think that stopping this medication may have brought about her symptoms.  He continues to be compliant with her metoprolol and amlodipine. Says that her Cardiologist is Dr. [name].   Past Medical History:  Diagnosis Date  . Anticoagulant long-term use    xarelto  . CAD S/P percutaneous coronary angioplasty cardiologist-  dr Dorris Carnes (previous cardiologist- dr Lia Foyer)   hx MI 04/ 1994  s/p  PCI to LAD  . CKD (chronic kidney disease), stage III ([place])   . Complication of anesthesia    hard to wake   . History of basal cell carcinoma (BCC) excision    right eye area 08/ 2013  . History of kidney stones 01/20/2017  . History of MI (myocardial infarction) 09/1992   s/p  PCI to LAD  . History of squamous cell carcinoma excision    right lower leg 2015 ;  2016  . Hyperlipidemia   . Hypertension   . Macular degeneration   . PAF (paroxysmal atrial fibrillation) ([place]) dx 02-24-2016   followed by cardiologist-- dr Mamie Nick. [name]  . PONV (postoperative nausea and vomiting)   . Right ureteral stone   . Wears glasses     Patient Active  Problem List   Diagnosis Date Noted  . Spinal stenosis in cervical region 04/24/2017  . Spinal stenosis of lumbar region 04/24/2017  . Dizziness 04/24/2017  . Ureteral stone with hydronephrosis 01/20/2017  . Atrial fibrillation with RVR ([place]) 02/24/2016  . CKD (chronic kidney disease) stage 3, GFR 30-59 ml/min (HCC) 02/24/2016  . Atrial fibrillation ([place]) 02/24/2016  . Exudative macular degeneration ([place]) 02/24/2016  . CHOLELITHIASIS 02/14/2009  . HYPERLIPIDEMIA 12/29/2008  . Essential hypertension 12/29/2008  . MYOCARDIAL INFARCTION, HX OF 12/29/2008  . CAD (coronary artery disease) 12/29/2008    Past Surgical History:  Procedure Laterality Date  . ABDOMINAL HYSTERECTOMY    . BREAST EXCISIONAL BIOPSY Left 11/02/2009   fibroadenoma (lumpectomy)  . BROW LIFT Right 04/07/2014   Procedure: REPAIR OF ENTROPION AND TRICHIASIS OF RIGHT LOWER EYE LID ;  Surgeon: Theodoro Kos, DO;  Location: [facility];  Service: Plastics;  Laterality: Right;  . CATARACT EXTRACTION W/ INTRAOCULAR LENS  IMPLANT, BILATERAL    . CORONARY ANGIOPLASTY  04/ 1994  dr Lia Foyer   PCI to LAD  . CYSTOSCOPY W/ URETERAL STENT PLACEMENT Right 01/20/2017   Procedure: CYSTOSCOPY WITH RIGHT RETROGRADE PYELOGRAM/RIGHT URETERAL STENT PLACEMENT;  Surgeon: Alexis Frock, MD;  Location: [facility];  Service: Urology;  Laterality: Right;  . CYSTOSCOPY WITH RETROGRADE PYELOGRAM, URETEROSCOPY AND STENT PLACEMENT Right 02/15/2017   Procedure: CYSTOSCOPY WITH RETROGRADE PYELOGRAM, URETEROSCOPY,  STONE BASKETRY AND STENT REPLACEMENT;  Surgeon: Alexis Frock, MD;  Location: [facility];  Service: Urology;  Laterality: Right;  . CYSTOSCOPY/RETROGRADE/URETEROSCOPY/STONE EXTRACTION WITH BASKET  yrs ago  . DILATION AND CURETTAGE OF UTERUS    . HOLMIUM LASER APPLICATION Right 1/54/0086   Procedure: HOLMIUM LASER APPLICATION;  Surgeon: Alexis Frock, MD;  Location: [facility];  Service: Urology;   Laterality: Right;  . KNEE SURGERY     right  . NEUROPLASTY / TRANSPOSITION ULNAR NERVE AT ELBOW Left 02-23-2000    [facility]   and Left Carpal Tunnel Release  . TRANSTHORACIC ECHOCARDIOGRAM  02/26/2016   moderate focal basal and mild concentric LVH,  ef 55-60%,  akinesis of the basal-midanteroseptal, inferoseptal and apicalinferior myocardium, study not sufficient to evaluation diastolic funciton due to atrial fib./  trivial PR/  mild TR    Current Outpatient Rx  . Order #: [id number] Class: Historical Med  . Order #: [id number] Class: Historical Med  . Order #: [id number] Class: Historical Med  . Order #: [id number] Class: Historical Med  . Order #: [id number] Class: Print  . Order #: [id number] Class: Print  . Order #: [id number] Class: Print  . Order #: [id number] Class: Print    Allergies Antihistamines, chlorpheniramine-type; Penicillins; Contrast media [iodinated diagnostic agents]; Sulfonamide derivatives; Atorvastatin; and Pheniramine  Family History  Problem Relation Age of Onset  . Coronary artery disease Unknown        positive family hx of  . Cancer Unknown        family hx of  . Brain cancer Son   . Liver cancer Daughter     Social History Social History   Tobacco Use  . Smoking status: Never Smoker  . Smokeless tobacco: Never Used  Substance Use Topics  . Alcohol use: No    Alcohol/week: 0.0 oz  . Drug use: No    Review of Systems  Constitutional: No fever/chills. Positive generalized weakness.  Eyes: No visual changes. ENT: No sore throat. Cardiovascular: Denies chest pain. Respiratory: Positive shortness of breath. Gastrointestinal: No abdominal pain.  No nausea, no vomiting.  No diarrhea.  No constipation. Genitourinary: Negative for dysuria. Musculoskeletal: Negative for back pain. Skin: Negative for rash. Neurological: Negative for headaches, focal weakness or numbness.  10-point ROS otherwise  negative.  ____________________________________________   PHYSICAL EXAM:  VITAL SIGNS: ED Triage Vitals  Enc Vitals Group     BP 06/26/17 1332 (!) 135/97     Pulse Rate 06/26/17 1332 (!) 115     Resp 06/26/17 1332 18     Temp 06/26/17 1332 97.6 F (36.4 C)     Temp Source 06/26/17 1332 Oral     SpO2 06/26/17 1332 97 %     Pain Score 06/26/17 1334 0   Constitutional: Alert and oriented. Well appearing and in no acute distress. Eyes: Conjunctivae are normal. Head: Atraumatic. Nose: No congestion/rhinnorhea. Mouth/Throat: Mucous membranes are moist.  Oropharynx non-erythematous. Neck: No stridor.  Cardiovascular: A-fib RVR. Good peripheral circulation. Grossly normal heart sounds.   Respiratory: Normal respiratory effort.  No retractions. Lungs CTAB. Gastrointestinal: Soft and nontender. No distention.  Musculoskeletal: No lower extremity tenderness nor edema. No gross deformities of extremities. Neurologic:  Normal speech and language. No gross focal neurologic deficits are appreciated.  Skin:  Skin is warm, dry and intact. No rash noted.  ____________________________________________   LABS (all labs ordered are listed, but only abnormal results are displayed)  Labs Reviewed  BASIC METABOLIC PANEL - Abnormal; Notable for the following components:  Result Value   Glucose, Bld 113 (*)    Creatinine, Ser 1.16 (*)    GFR calc non Af Amer 40 (*)    GFR calc Af Amer 46 (*)    All other components within normal limits  CBC - Abnormal; Notable for the following components:   MCH 25.7 (*)    RDW 16.8 (*)    All other components within normal limits  BRAIN NATRIURETIC PEPTIDE - Abnormal; Notable for the following components:   B Natriuretic Peptide 644.9 (*)    All other components within normal limits  HEPATIC FUNCTION PANEL  TSH  MAGNESIUM  I-STAT TROPONIN, ED   ____________________________________________  EKG   EKG Interpretation  Date/Time:  Wednesday  June 26 2017 13:25:21 EST Ventricular Rate:  112 PR Interval:    QRS Duration: 102 QT Interval:  338 QTC Calculation: 461 R Axis:   -72 Text Interpretation:  Atrial fibrillation with rapid ventricular response Left axis deviation Minimal voltage criteria for LVH, may be normal variant Abnormal ECG No STEMI.  Confirmed by Nanda Quinton [phone]) on 06/26/2017 3:22:51 PM       ____________________________________________  RADIOLOGY  Dg Chest 2 View  Result Date: 06/26/2017 CLINICAL DATA:  Atrial fibrillation EXAM: CHEST  2 VIEW COMPARISON:  08/22/2016 FINDINGS: Heart size within normal limits. Negative for heart failure. Mild bibasilar atelectasis/scarring. Negative for pneumonia or effusion. IMPRESSION: No active cardiopulmonary disease. Electronically Signed   By: Franchot Gallo M.D.   On: 06/26/2017 15:01    ____________________________________________   PROCEDURES  Procedure(s) performed:   Procedures  CRITICAL CARE Performed by: Margette Fast Total critical care time: 32 minutes Critical care time was exclusive of separately billable procedures and treating other patients. Critical care was necessary to treat or prevent imminent or life-threatening deterioration. Critical care was time spent personally by me on the following activities: development of treatment plan with patient and/or surrogate as well as nursing, discussions with consultants, evaluation of patient's response to treatment, examination of patient, obtaining history from patient or surrogate, ordering and performing treatments and interventions, ordering and review of laboratory studies, ordering and review of radiographic studies, pulse oximetry and re-evaluation of patient's condition.  Nanda Quinton, MD Emergency Medicine  ____________________________________________   INITIAL IMPRESSION / ASSESSMENT AND PLAN / ED COURSE  Pertinent labs & imaging results that were available during my care of the patient  were reviewed by me and considered in my medical decision making (see chart for details).  She presents to the emergency department for evaluation of dyspnea on exertion and generalized weakness.  She is here in A. fib RVR.  She is awake, alert, asymptomatic at rest.  Looks very well.  Suspect that the A. fib is the reason for her symptoms.  She stopped her Xarelto 1 week prior and restarted yesterday.  I do not think she would be a good ED cardioversion candidate.  I will discussed with cardiology and have asked for consultation in this case.  Added additional labs.   05:40 PM Able to control A. fib RVR with metoprolol IV.  Cardiology has seen the patient and consult note.  They are increasing the patient's metoprolol and discontinuing the amlodipine.  Patient will start Pradaxa and discontinue her Xarelto.  They will follow-up in A. fib clinic.  Audiology has placed the referral in [place]. Patient feeling better and given Lasix/PO metoprolol prior to discharge.   At this time, I do not feel there is any life-threatening condition present. I  have reviewed and discussed all results (EKG, imaging, lab, urine as appropriate), exam findings with patient. I have reviewed nursing notes and appropriate previous records.  I feel the patient is safe to be discharged home without further emergent workup. Discussed usual and customary return precautions. Patient and family (if present) verbalize understanding and are comfortable with this plan.  Patient will follow-up with their primary care provider. If they do not have a primary care provider, information for follow-up has been provided to them. All questions have been answered.  ____________________________________________  FINAL CLINICAL IMPRESSION(S) / ED DIAGNOSES  Final diagnoses:  Atrial fibrillation with RVR (HCC)  Exertional dyspnea     MEDICATIONS GIVEN DURING THIS VISIT:  Medications  sodium chloride 0.9 % bolus 500 mL (0 mLs Intravenous  Stopped 06/26/17 1641)  metoprolol tartrate (LOPRESSOR) injection 5 mg (5 mg Intravenous Given 06/26/17 1602)  metoprolol tartrate (LOPRESSOR) tablet 50 mg (50 mg Oral Given 06/26/17 1737)  furosemide (LASIX) injection 20 mg (20 mg Intravenous Given 06/26/17 1737)     NEW OUTPATIENT MEDICATIONS STARTED DURING THIS VISIT:  Current Discharge Medication List    START taking these medications   Details  !! dabigatran (PRADAXA) 150 MG CAPS capsule Take 1 capsule (150 mg total) by mouth 2 (two) times daily. Qty: 60 capsule, Refills: 11    !! dabigatran (PRADAXA) 150 MG CAPS capsule Take 1 capsule (150 mg total) by mouth 2 (two) times daily. Qty: 60 capsule, Refills: 0   Comments: For free 30 day supply     !! - Potential duplicate medications found. Please discuss with provider.      Note:  This document was prepared using Dragon voice recognition software and may include unintentional dictation errors.  Nanda Quinton, MD Emergency Medicine    Long, Wonda [facility], MD 06/26/17 2325

## 2017-06-27 ENCOUNTER — Telehealth (HOSPITAL_COMMUNITY): Payer: Self-pay | Admitting: *Deleted

## 2017-06-27 NOTE — Telephone Encounter (Signed)
Attempted to contact pt to schedule follow up from ED.  Phone continued to ring with no answer. Tried the alternate number list under [name]. I gave directions and appt to Mr. Alroy Dust, line got disconnected and I cld back and left msg with code and instructions

## 2017-07-01 ENCOUNTER — Encounter: Payer: PRIVATE HEALTH INSURANCE | Admitting: Physical Therapy

## 2017-07-01 ENCOUNTER — Ambulatory Visit (HOSPITAL_COMMUNITY)
Admission: RE | Admit: 2017-07-01 | Discharge: 2017-07-01 | Disposition: A | Discharge status: Home / Self Care | Payer: Medicare Other | Source: Ambulatory Visit | Attending: Nurse Practitioner | Admitting: Nurse Practitioner

## 2017-07-01 ENCOUNTER — Encounter (HOSPITAL_COMMUNITY): Payer: Self-pay | Admitting: Nurse Practitioner

## 2017-07-01 VITALS — BP 156/92 | HR 107 | Ht 65.0 in | Wt 166.0 lb

## 2017-07-01 DIAGNOSIS — Z9842 Cataract extraction status, left eye: Secondary | ICD-10-CM | POA: Diagnosis not present

## 2017-07-01 DIAGNOSIS — Z808 Family history of malignant neoplasm of other organs or systems: Secondary | ICD-10-CM | POA: Diagnosis not present

## 2017-07-01 DIAGNOSIS — I252 Old myocardial infarction: Secondary | ICD-10-CM | POA: Diagnosis not present

## 2017-07-01 DIAGNOSIS — H3530 Unspecified macular degeneration: Secondary | ICD-10-CM | POA: Insufficient documentation

## 2017-07-01 DIAGNOSIS — Z880 Allergy status to penicillin: Secondary | ICD-10-CM | POA: Insufficient documentation

## 2017-07-01 DIAGNOSIS — Z87442 Personal history of urinary calculi: Secondary | ICD-10-CM | POA: Insufficient documentation

## 2017-07-01 DIAGNOSIS — Z9841 Cataract extraction status, right eye: Secondary | ICD-10-CM | POA: Diagnosis not present

## 2017-07-01 DIAGNOSIS — I481 Persistent atrial fibrillation: Secondary | ICD-10-CM | POA: Insufficient documentation

## 2017-07-01 DIAGNOSIS — Z79899 Other long term (current) drug therapy: Secondary | ICD-10-CM | POA: Diagnosis not present

## 2017-07-01 DIAGNOSIS — N183 Chronic kidney disease, stage 3 (moderate): Secondary | ICD-10-CM | POA: Insufficient documentation

## 2017-07-01 DIAGNOSIS — Z882 Allergy status to sulfonamides status: Secondary | ICD-10-CM | POA: Insufficient documentation

## 2017-07-01 DIAGNOSIS — Z800 Family history of malignant neoplasm of digestive organs: Secondary | ICD-10-CM | POA: Insufficient documentation

## 2017-07-01 DIAGNOSIS — Z888 Allergy status to other drugs, medicaments and biological substances status: Secondary | ICD-10-CM | POA: Insufficient documentation

## 2017-07-01 DIAGNOSIS — Z91041 Radiographic dye allergy status: Secondary | ICD-10-CM | POA: Diagnosis not present

## 2017-07-01 DIAGNOSIS — I480 Paroxysmal atrial fibrillation: Secondary | ICD-10-CM | POA: Diagnosis present

## 2017-07-01 DIAGNOSIS — Z7901 Long term (current) use of anticoagulants: Secondary | ICD-10-CM | POA: Insufficient documentation

## 2017-07-01 DIAGNOSIS — Z85828 Personal history of other malignant neoplasm of skin: Secondary | ICD-10-CM | POA: Diagnosis not present

## 2017-07-01 DIAGNOSIS — I129 Hypertensive chronic kidney disease with stage 1 through stage 4 chronic kidney disease, or unspecified chronic kidney disease: Secondary | ICD-10-CM | POA: Insufficient documentation

## 2017-07-01 DIAGNOSIS — Z9861 Coronary angioplasty status: Secondary | ICD-10-CM | POA: Diagnosis not present

## 2017-07-01 DIAGNOSIS — Z961 Presence of intraocular lens: Secondary | ICD-10-CM | POA: Diagnosis not present

## 2017-07-01 DIAGNOSIS — E785 Hyperlipidemia, unspecified: Secondary | ICD-10-CM | POA: Diagnosis not present

## 2017-07-01 DIAGNOSIS — I4819 Other persistent atrial fibrillation: Secondary | ICD-10-CM

## 2017-07-01 DIAGNOSIS — Z90710 Acquired absence of both cervix and uterus: Secondary | ICD-10-CM | POA: Insufficient documentation

## 2017-07-01 DIAGNOSIS — I2510 Atherosclerotic heart disease of native coronary artery without angina pectoris: Secondary | ICD-10-CM | POA: Insufficient documentation

## 2017-07-01 MED ORDER — METOPROLOL TARTRATE 50 MG PO TABS: 50.0000 mg | ORAL_TABLET | Freq: Two times a day (BID) | ORAL | 5 refills | Status: DC

## 2017-07-01 MED ORDER — APIXABAN 5 MG PO TABS: 5.0000 mg | ORAL_TABLET | Freq: Two times a day (BID) | ORAL | 0 refills | Status: DC

## 2017-07-01 NOTE — Progress Notes (Addendum)
Primary Care Physician: [name], MD Referring Physician: [facility] ER f/u Cardiologist: Dr. Albertina Senegal is a 84 y.o. female with a h/o CAD, CKD, PAF on Xarelto, HTN, and HLD since to the emergency department for evaluation of exertional dyspnea and generalized weakness worsening over the past several days. Patient stated that she has had intermittent atrial fibrillation and had her Xarelto discontinued by her cardiologist 1 week ago with concern for possible rash development. She was given rx for pradaxa but did not start 2/2 cost of drug. She denied chest pain, palpitations, shortness of breath.  No other modifying factors.  She restarted her Xarelto yesterday, she thought  that stopping this medication may have brought about her symptoms.  SHe continues to be compliant with her metoprolol and amlodipine.   She is in the afib clinic for f/u. She remains in afib at v rate of 107 bpm.  She was suppose to increase metoprolol to 50 mg bid, per ER instructions, but wanted to wait until today to clarify dose.  Shealso wants to try eliquis instead of using more expensive pradaxa, but explained to pt that both drugs are Factor X A inhibitors and she may experience rash with it as well, but she wants to try. Has had the rash x one year, shortly after starting xarelto. Feels well at rest but notices shortness of breath and fatigue with exertion.  Today, she denies symptoms of palpitations, chest pain, shortness of breath, orthopnea, PND, lower extremity edema, dizziness, presyncope, syncope, or neurologic sequela. The patient is tolerating medications without difficulties and is otherwise without complaint today.   Past Medical History:  Diagnosis Date  . Anticoagulant long-term use    xarelto  . CAD S/P percutaneous coronary angioplasty cardiologist-  dr Dorris Carnes (previous cardiologist- dr Lia Foyer)   hx MI 04/ 1994  s/p  PCI to LAD  . CKD (chronic kidney disease), stage III ([place])   .  Complication of anesthesia    hard to wake   . History of basal cell carcinoma (BCC) excision    right eye area 08/ 2013  . History of kidney stones 01/20/2017  . History of MI (myocardial infarction) 09/1992   s/p  PCI to LAD  . History of squamous cell carcinoma excision    right lower leg 2015 ;  2016  . Hyperlipidemia   . Hypertension   . Macular degeneration   . PAF (paroxysmal atrial fibrillation) ([place]) dx 02-24-2016   followed by cardiologist-- dr Mamie Nick. [name]  . PONV (postoperative nausea and vomiting)   . Right ureteral stone   . Wears glasses    Past Surgical History:  Procedure Laterality Date  . ABDOMINAL HYSTERECTOMY    . BREAST EXCISIONAL BIOPSY Left 11/02/2009   fibroadenoma (lumpectomy)  . BROW LIFT Right 04/07/2014   Procedure: REPAIR OF ENTROPION AND TRICHIASIS OF RIGHT LOWER EYE LID ;  Surgeon: Theodoro Kos, DO;  Location: [facility];  Service: Plastics;  Laterality: Right;  . CATARACT EXTRACTION W/ INTRAOCULAR LENS  IMPLANT, BILATERAL    . CORONARY ANGIOPLASTY  04/ 1994  dr Lia Foyer   PCI to LAD  . CYSTOSCOPY W/ URETERAL STENT PLACEMENT Right 01/20/2017   Procedure: CYSTOSCOPY WITH RIGHT RETROGRADE PYELOGRAM/RIGHT URETERAL STENT PLACEMENT;  Surgeon: Alexis Frock, MD;  Location[facility];  Service: Urology;  Laterality: Right;  . CYSTOSCOPY WITH RETROGRADE PYELOGRAM, URETEROSCOPY AND STENT PLACEMENT Right 02/15/2017   Procedure: CYSTOSCOPY WITH RETROGRADE PYELOGRAM, URETEROSCOPY, STONE BASKETRY  AND STENT REPLACEMENT;  Surgeon: Alexis Frock, MD;  Location: [facility];  Service: Urology;  Laterality: Right;  . CYSTOSCOPY/RETROGRADE/URETEROSCOPY/STONE EXTRACTION WITH BASKET  yrs ago  . DILATION AND CURETTAGE OF UTERUS    . HOLMIUM LASER APPLICATION Right 7/82/9562   Procedure: HOLMIUM LASER APPLICATION;  Surgeon: Alexis Frock, MD;  [facility];  Service: Urology;  Laterality: Right;  . KNEE SURGERY     right    . NEUROPLASTY / TRANSPOSITION ULNAR NERVE AT ELBOW Left 02-23-2000    [facility]   and Left Carpal Tunnel Release  . TRANSTHORACIC ECHOCARDIOGRAM  02/26/2016   moderate focal basal and mild concentric LVH,  ef 55-60%,  akinesis of the basal-midanteroseptal, inferoseptal and apicalinferior myocardium, study not sufficient to evaluation diastolic funciton due to atrial fib./  trivial PR/  mild TR    Current Outpatient Medications  Medication Sig Dispense Refill  . acetaminophen (TYLENOL) 325 MG tablet Take 650 mg by mouth every 6 (six) hours as needed for mild pain.    . metoprolol tartrate (LOPRESSOR) 50 MG tablet Take 1 tablet (50 mg total) by mouth 2 (two) times daily. 60 tablet 5  . Multiple Vitamins-Minerals (ICAPS AREDS 2) CAPS Take 1 capsule by mouth daily.    Vladimir Faster Glycol-Propyl Glycol (SYSTANE OP) Place 1 drop into both eyes daily as needed (itchy eyes).    . Rivaroxaban (XARELTO) 15 MG TABS tablet Take 15 mg by mouth daily.    . traMADol (ULTRAM) 50 MG tablet Take 1 tablet (50 mg total) by mouth every 6 (six) hours as needed for moderate pain or severe pain. Post-operatively. 15 tablet 0  . triamcinolone (NASACORT ALLERGY 24HR) 55 MCG/ACT AERO nasal inhaler Place 1-2 sprays into the nose 2 (two) times daily as needed (for rhinitis).     [name] apixaban (ELIQUIS) 5 MG TABS tablet Take 1 tablet (5 mg total) by mouth 2 (two) times daily. 60 tablet 0   No current facility-administered medications for this encounter.     Allergies  Allergen Reactions  . Antihistamines, Chlorpheniramine-Type Other (See Comments)    Pt states that she passed out.   [name] Penicillins Anaphylaxis and Other (See Comments)    Has patient had a PCN reaction causing immediate rash, facial/tongue/throat swelling, SOB or lightheadedness with hypotension: Yes Has patient had a PCN reaction causing severe rash involving mucus membranes or skin necrosis: No Has patient had a PCN reaction that required hospitalization  No Has patient had a PCN reaction occurring within the last 10 years: No If all of the above answers are "NO", then may proceed with Cephalosporin use.  . Contrast Media [Iodinated Diagnostic Agents] Hives  . Sulfonamide Derivatives Other (See Comments)    Reaction:  Unknown   . Atorvastatin Other (See Comments)    Reaction:  Unknown   . Pheniramine Other (See Comments)    Pt states that she passed out.     Social History   Socioeconomic History  . Marital status: Widowed    Spouse name: Not on file  . Number of children: 2  . Years of education: Not on file  . Highest education level: Not on file  Social Needs  . Financial resource strain: Not on file  . Food insecurity - worry: Not on file  . Food insecurity - inability: Not on file  . Transportation needs - medical: Not on file  . Transportation needs - non-medical: Not on file  Occupational History  . Occupation:  Retired  Tobacco Use  . Smoking status: Never Smoker  . Smokeless tobacco: Never Used  Substance and Sexual Activity  . Alcohol use: No    Alcohol/week: 0.0 oz  . Drug use: No  . Sexual activity: Not on file  Other Topics Concern  . Not on file  Social History Narrative   Lives at home    Family History  Problem Relation Age of Onset  . Coronary artery disease Unknown        positive family hx of  . Cancer Unknown        family hx of  . Brain cancer Son   . Liver cancer Daughter     ROS- All systems are reviewed and negative except as per the HPI above  Physical Exam: Vitals:   07/01/17 1109  BP: (!) 156/92  Pulse: (!) 107  Weight: 166 lb (75.3 kg)  Height: 5\' 5"  (1.651 m)   Wt Readings from Last 3 Encounters:  07/01/17 166 lb (75.3 kg)  04/24/17 162 lb 9.6 oz (73.8 kg)  02/15/17 160 lb 8 oz (72.8 kg)    Labs: Lab Results  Component Value Date   NA 141 06/26/2017   K 4.8 06/26/2017   CL 108 06/26/2017   CO2 22 06/26/2017   GLUCOSE 113 (H) 06/26/2017   BUN 19 06/26/2017    CREATININE 1.16 (H) 06/26/2017   CALCIUM 9.8 06/26/2017   MG 2.1 06/26/2017   No results found for: INR No results found for: CHOL, HDL, LDLCALC, TRIG   GEN- The patient is well appearing, alert and oriented x 3 today.   Head- normocephalic, atraumatic Eyes-  Sclera clear, conjunctiva pink Ears- hearing intact Oropharynx- clear Neck- supple, no JVP Lymph- no cervical lymphadenopathy Lungs- Clear to ausculation bilaterally, normal work of breathing Heart- irregular rate and rhythm, no murmurs, rubs or gallops, PMI not laterally displaced GI- soft, NT, ND, + BS Extremities- no clubbing, cyanosis, or edema MS- no significant deformity or atrophy Skin- no rash or lesion Psych- euthymic mood, full affect Neuro- strength and sensation are intact  EKG-Afib at 107 bpm, qrs int 102 ms, qtc 475 bpm,  Epic records reviewed    Assessment and Plan: 1. Persistent  afib General education re afib Precautions discussed Increase metoprolol to 50 mg bid as per ER instructions for better rate control Will stop xarelto and start eliquis 5 mg bid for chadsvasc score of at least 5 Will plan on cardioversion once on anticoagulation uninterrupted x three weeks  2. HTN Elevated today but think increase of metoprolol will have to normalize BP Avoid salt  F/u in 1 week  Butch Penny C. Shawntelle Ungar, [facility] [address] [place], [place] [address] [phone]

## 2017-07-01 NOTE — Patient Instructions (Signed)
Stop xarelto  Start Eliquis 5mg twice a day 

## 2017-07-03 ENCOUNTER — Encounter: Payer: PRIVATE HEALTH INSURANCE | Admitting: Physical Therapy

## 2017-07-05 ENCOUNTER — Telehealth (HOSPITAL_COMMUNITY): Payer: Self-pay | Admitting: *Deleted

## 2017-07-05 MED ORDER — DILTIAZEM HCL ER COATED BEADS 120 MG PO CP24: 120.0000 mg | ORAL_CAPSULE | Freq: Every day | ORAL | 3 refills | Status: DC

## 2017-07-05 NOTE — Telephone Encounter (Signed)
Patient called in stating she feels she is allergic to metoprolol. She has a constant runny nose, coughing up mucus and short of breath. Questioned pt whether she could be having URI - be adamantly denied this as a possibility. BP 150/85 HR 103. She did not take her metoprolol this morning and took an amlodipine instead (although this was discontinued upon discharge from hospital - pt felt she should take something for her blood pressure this morning.) Discussed with Roderic Palau NP will stop metoprolol and start cardizem 120mg  once a day and no amlodipine. Pt verbalized understanding will start cardizem tonight since she took amlodipine this morning. Pt has appt next week but will call if issues arise before then.

## 2017-07-07 DIAGNOSIS — L299 Pruritus, unspecified: Secondary | ICD-10-CM | POA: Diagnosis not present

## 2017-07-07 DIAGNOSIS — T782XXA Anaphylactic shock, unspecified, initial encounter: Secondary | ICD-10-CM | POA: Diagnosis not present

## 2017-07-08 ENCOUNTER — Encounter: Payer: PRIVATE HEALTH INSURANCE | Admitting: Physical Therapy

## 2017-07-08 ENCOUNTER — Ambulatory Visit (HOSPITAL_COMMUNITY)
Admission: RE | Admit: 2017-07-08 | Discharge: 2017-07-08 | Disposition: A | Discharge status: Home / Self Care | Payer: Medicare Other | Source: Ambulatory Visit | Attending: Nurse Practitioner | Admitting: Nurse Practitioner

## 2017-07-08 ENCOUNTER — Encounter (HOSPITAL_COMMUNITY): Payer: Self-pay | Admitting: Nurse Practitioner

## 2017-07-08 VITALS — BP 134/72 | HR 127 | Ht 65.0 in | Wt 165.0 lb

## 2017-07-08 DIAGNOSIS — I252 Old myocardial infarction: Secondary | ICD-10-CM | POA: Diagnosis not present

## 2017-07-08 DIAGNOSIS — I129 Hypertensive chronic kidney disease with stage 1 through stage 4 chronic kidney disease, or unspecified chronic kidney disease: Secondary | ICD-10-CM | POA: Insufficient documentation

## 2017-07-08 DIAGNOSIS — N183 Chronic kidney disease, stage 3 (moderate): Secondary | ICD-10-CM | POA: Diagnosis not present

## 2017-07-08 DIAGNOSIS — R21 Rash and other nonspecific skin eruption: Secondary | ICD-10-CM | POA: Insufficient documentation

## 2017-07-08 DIAGNOSIS — I4819 Other persistent atrial fibrillation: Secondary | ICD-10-CM

## 2017-07-08 DIAGNOSIS — E785 Hyperlipidemia, unspecified: Secondary | ICD-10-CM | POA: Diagnosis not present

## 2017-07-08 DIAGNOSIS — Z880 Allergy status to penicillin: Secondary | ICD-10-CM | POA: Insufficient documentation

## 2017-07-08 DIAGNOSIS — I2510 Atherosclerotic heart disease of native coronary artery without angina pectoris: Secondary | ICD-10-CM | POA: Diagnosis not present

## 2017-07-08 DIAGNOSIS — I481 Persistent atrial fibrillation: Secondary | ICD-10-CM

## 2017-07-08 DIAGNOSIS — Z882 Allergy status to sulfonamides status: Secondary | ICD-10-CM | POA: Insufficient documentation

## 2017-07-08 DIAGNOSIS — Z7901 Long term (current) use of anticoagulants: Secondary | ICD-10-CM | POA: Diagnosis not present

## 2017-07-08 DIAGNOSIS — Z79899 Other long term (current) drug therapy: Secondary | ICD-10-CM | POA: Insufficient documentation

## 2017-07-08 MED ORDER — DILTIAZEM HCL ER COATED BEADS 120 MG PO CP24: 120.0000 mg | ORAL_CAPSULE | Freq: Two times a day (BID) | ORAL | 3 refills | Status: DC

## 2017-07-08 NOTE — Patient Instructions (Addendum)
Increase cardizem to 120mg  twice a day  Call on Thursday with heart rate and blood pressure readings.

## 2017-07-08 NOTE — Progress Notes (Signed)
Primary Care Physician: [name], MD Referring Physician: [facility] ER f/u Cardiologist: Shelly Obrien is a 84 y.o. female with a h/o CAD, CKD, PAF on Xarelto, HTN, and HLD since to the emergency department for evaluation of exertional dyspnea and generalized weakness worsening over the past several days. Patient stated that she has had intermittent atrial fibrillation and had her Xarelto discontinued by her cardiologist 1 week ago with concern for possible rash development. She was given rx for pradaxa but did not start 2/2 cost of drug. She denied chest pain, palpitations, shortness of breath.  No other modifying factors.  She restarted her Xarelto yesterday, she thought  that stopping this medication may have brought about her symptoms.  She continues to be compliant with her metoprolol and amlodipine.   She is in the afib clinic for f/u. She remains in afib at v rate of 107 bpm.  She was suppose to increase metoprolol to 50 mg bid, per ER instructions, but wanted to wait until today to clarify dose.  Shealso wants to try eliquis instead of using more expensive pradaxa, but explained to pt that both drugs are Factor X A inhibitors and she may experience rash with it as well, but she wants to try. Has had the rash x one year, shortly after starting xarelto. Feels well at rest but notices shortness of breath and fatigue with exertion.  F/u in the afib clinic, 2/4. She went to the local fire station yesterday, as she felt jittery on Sunday. A strip was done that showed afib at 88 bpm. EMS was called and they talked to pt and encouraged her to be seen in the office today instead of Wednesday. [name], she called to the office on Friday and was complaining that the BB was making her nose run and have a cough. She was changed to Cardizem 120 mg a day and BB was stopped. She is in afib with RVR today at 127 bpm, but states that she feels better with less runny nose and cough with stopping BB and  is having less itching with change to xarelto.Overall, she appears to be tolerating the afib well. She  will not be able to have cardioversion until after 2/13, as she did not restart anticoagulation until 1/23.  Today, she denies symptoms of palpitations, chest pain, shortness of breath, orthopnea, PND, lower extremity edema, dizziness, presyncope, syncope, or neurologic sequela. The patient is tolerating medications without difficulties and is otherwise without complaint today.   Past Medical History:  Diagnosis Date  . Anticoagulant long-term use    xarelto  . CAD S/P percutaneous coronary angioplasty cardiologist-  dr Dorris Carnes (previous cardiologist- dr Lia Foyer)   hx MI 04/ 1994  s/p  PCI to LAD  . CKD (chronic kidney disease), stage III ([place])   . Complication of anesthesia    hard to wake   . History of basal cell carcinoma (BCC) excision    right eye area 08/ 2013  . History of kidney stones 01/20/2017  . History of MI (myocardial infarction) 09/1992   s/p  PCI to LAD  . History of squamous cell carcinoma excision    right lower leg 2015 ;  2016  . Hyperlipidemia   . Hypertension   . Macular degeneration   . PAF (paroxysmal atrial fibrillation) ([place]) dx 02-24-2016   followed by cardiologist-- dr Mamie Nick. [name]  . PONV (postoperative nausea and vomiting)   . Right ureteral stone   .  Wears glasses    Past Surgical History:  Procedure Laterality Date  . ABDOMINAL HYSTERECTOMY    . BREAST EXCISIONAL BIOPSY Left 11/02/2009   fibroadenoma (lumpectomy)  . BROW LIFT Right 04/07/2014   Procedure: REPAIR OF ENTROPION AND TRICHIASIS OF RIGHT LOWER EYE LID ;  Surgeon: Theodoro Kos, DO;  Location: [place];  Service: Plastics;  Laterality: Right;  . CATARACT EXTRACTION W/ INTRAOCULAR LENS  IMPLANT, BILATERAL    . CORONARY ANGIOPLASTY  04/ 1994  dr Lia Foyer   PCI to LAD  . CYSTOSCOPY W/ URETERAL STENT PLACEMENT Right 01/20/2017   Procedure: CYSTOSCOPY WITH RIGHT RETROGRADE  PYELOGRAM/RIGHT URETERAL STENT PLACEMENT;  Surgeon: Alexis Frock, MD;  Location: [facility];  Service: Urology;  Laterality: Right;  . CYSTOSCOPY WITH RETROGRADE PYELOGRAM, URETEROSCOPY AND STENT PLACEMENT Right 02/15/2017   Procedure: CYSTOSCOPY WITH RETROGRADE PYELOGRAM, URETEROSCOPY, STONE BASKETRY AND STENT REPLACEMENT;  Surgeon: Alexis Frock, MD;  [facility];  Service: Urology;  Laterality: Right;  . CYSTOSCOPY/RETROGRADE/URETEROSCOPY/STONE EXTRACTION WITH BASKET  yrs ago  . DILATION AND CURETTAGE OF UTERUS    . HOLMIUM LASER APPLICATION Right 9/38/1017   Procedure: HOLMIUM LASER APPLICATION;  Surgeon: Alexis Frock, MD;  Location: [facility];  Service: Urology;  Laterality: Right;  . KNEE SURGERY     right  . NEUROPLASTY / TRANSPOSITION ULNAR NERVE AT ELBOW Left 02-23-2000    [facility]   and Left Carpal Tunnel Release  . TRANSTHORACIC ECHOCARDIOGRAM  02/26/2016   moderate focal basal and mild concentric LVH,  ef 55-60%,  akinesis of the basal-midanteroseptal, inferoseptal and apicalinferior myocardium, study not sufficient to evaluation diastolic funciton due to atrial fib./  trivial PR/  mild TR    Current Outpatient Medications  Medication Sig Dispense Refill  . acetaminophen (TYLENOL) 325 MG tablet Take 650 mg by mouth every 6 (six) hours as needed for mild pain.    [name] apixaban (ELIQUIS) 5 MG TABS tablet Take 1 tablet (5 mg total) by mouth 2 (two) times daily. 60 tablet 0  . diltiazem (CARDIZEM CD) 120 MG 24 hr capsule Take 1 capsule (120 mg total) by mouth 2 (two) times daily. 30 capsule 3  . Multiple Vitamins-Minerals (ICAPS AREDS 2) CAPS Take 1 capsule by mouth daily.    Vladimir Faster Glycol-Propyl Glycol (SYSTANE OP) Place 1 drop into both eyes daily as needed (itchy eyes).    . Rivaroxaban (XARELTO) 15 MG TABS tablet Take 15 mg by mouth daily.    . traMADol (ULTRAM) 50 MG tablet Take 1 tablet (50 mg total) by mouth every 6 (six) hours as  needed for moderate pain or severe pain. Post-operatively. 15 tablet 0  . triamcinolone (NASACORT ALLERGY 24HR) 55 MCG/ACT AERO nasal inhaler Place 1-2 sprays into the nose 2 (two) times daily as needed (for rhinitis).      No current facility-administered medications for this encounter.     Allergies  Allergen Reactions  . Antihistamines, Chlorpheniramine-Type Other (See Comments)    Pt states that she passed out.   [name] Penicillins Anaphylaxis and Other (See Comments)    Has patient had a PCN reaction causing immediate rash, facial/tongue/throat swelling, SOB or lightheadedness with hypotension: Yes Has patient had a PCN reaction causing severe rash involving mucus membranes or skin necrosis: No Has patient had a PCN reaction that required hospitalization No Has patient had a PCN reaction occurring within the last 10 years: No If all of the above answers are "NO", then may proceed  with Cephalosporin use.  . Contrast Media [Iodinated Diagnostic Agents] Hives  . Sulfonamide Derivatives Other (See Comments)    Reaction:  Unknown   . Atorvastatin Other (See Comments)    Reaction:  Unknown   . Pheniramine Other (See Comments)    Pt states that she passed out.     Social History   Socioeconomic History  . Marital status: Widowed    Spouse name: Not on file  . Number of children: 2  . Years of education: Not on file  . Highest education level: Not on file  Social Needs  . Financial resource strain: Not on file  . Food insecurity - worry: Not on file  . Food insecurity - inability: Not on file  . Transportation needs - medical: Not on file  . Transportation needs - non-medical: Not on file  Occupational History  . Occupation: Retired  Tobacco Use  . Smoking status: Never Smoker  . Smokeless tobacco: Never Used  Substance and Sexual Activity  . Alcohol use: No    Alcohol/week: 0.0 oz  . Drug use: No  . Sexual activity: Not on file  Other Topics Concern  . Not on file  Social  History Narrative   Lives at home    Family History  Problem Relation Age of Onset  . Coronary artery disease Unknown        positive family hx of  . Cancer Unknown        family hx of  . Brain cancer Son   . Liver cancer Daughter     ROS- All systems are reviewed and negative except as per the HPI above  Physical Exam: Vitals:   07/08/17 1328  BP: 134/72  Pulse: (!) 127  Weight: 165 lb (74.8 kg)  Height: 5\' 5"  (1.651 m)   Wt Readings from Last 3 Encounters:  07/08/17 165 lb (74.8 kg)  07/01/17 166 lb (75.3 kg)  04/24/17 162 lb 9.6 oz (73.8 kg)    Labs: Lab Results  Component Value Date   NA 141 06/26/2017   K 4.8 06/26/2017   CL 108 06/26/2017   CO2 22 06/26/2017   GLUCOSE 113 (H) 06/26/2017   BUN 19 06/26/2017   CREATININE 1.16 (H) 06/26/2017   CALCIUM 9.8 06/26/2017   MG 2.1 06/26/2017   No results found for: INR No results found for: CHOL, HDL, LDLCALC, TRIG   GEN- The patient is well appearing, alert and oriented x 3 today.   Head- normocephalic, atraumatic Eyes-  Sclera clear, conjunctiva pink Ears- hearing intact Oropharynx- clear Neck- supple, no JVP Lymph- no cervical lymphadenopathy Lungs- Clear to ausculation bilaterally, normal work of breathing Heart- irregular rate and rhythm, no murmurs, rubs or gallops, PMI not laterally displaced GI- soft, NT, ND, + BS Extremities- no clubbing, cyanosis, or edema MS- no significant deformity or atrophy Skin- no rash or lesion Psych- euthymic mood, full affect Neuro- strength and sensation are intact  EKG-Afib at 127 bpm, qrs int 96 ms, qtc 4756 bpm,  Epic records reviewed    Assessment and Plan: 1. Persistent  afib Stopped BB 2/2 to runny nose and cough Started on cardizem 120 mg a day and feels better but needs more rate control Will increase to 120 mg bid On eliquis 5 mg bid for chadsvasc score of at least 5, changed from xarelto, thinks rash is improving Will plan on cardioversion once on  anticoagulation uninterrupted x three weeks(after  2/13)  2. HTN Stable, she should  have enough BP to work with bid dosing of cardizem  F/u in latter part of this week  Shelly Obrien, [facility] [address] [place], [place] [address] [phone]

## 2017-07-10 ENCOUNTER — Ambulatory Visit (HOSPITAL_COMMUNITY): Payer: PRIVATE HEALTH INSURANCE | Admitting: Nurse Practitioner

## 2017-07-10 ENCOUNTER — Encounter: Payer: PRIVATE HEALTH INSURANCE | Admitting: Physical Therapy

## 2017-07-11 ENCOUNTER — Ambulatory Visit (HOSPITAL_COMMUNITY): Payer: PRIVATE HEALTH INSURANCE | Admitting: Nurse Practitioner

## 2017-07-12 ENCOUNTER — Encounter (HOSPITAL_COMMUNITY): Payer: Self-pay | Admitting: Nurse Practitioner

## 2017-07-12 ENCOUNTER — Ambulatory Visit (HOSPITAL_COMMUNITY)
Admission: RE | Admit: 2017-07-12 | Discharge: 2017-07-12 | Disposition: A | Discharge status: Home / Self Care | Payer: Medicare Other | Source: Ambulatory Visit | Attending: Nurse Practitioner | Admitting: Nurse Practitioner

## 2017-07-12 VITALS — BP 146/82 | HR 88 | Ht 65.0 in | Wt 167.0 lb

## 2017-07-12 DIAGNOSIS — I480 Paroxysmal atrial fibrillation: Secondary | ICD-10-CM | POA: Insufficient documentation

## 2017-07-12 DIAGNOSIS — H3530 Unspecified macular degeneration: Secondary | ICD-10-CM | POA: Diagnosis not present

## 2017-07-12 DIAGNOSIS — Z880 Allergy status to penicillin: Secondary | ICD-10-CM | POA: Insufficient documentation

## 2017-07-12 DIAGNOSIS — Z90710 Acquired absence of both cervix and uterus: Secondary | ICD-10-CM | POA: Insufficient documentation

## 2017-07-12 DIAGNOSIS — Z882 Allergy status to sulfonamides status: Secondary | ICD-10-CM | POA: Diagnosis not present

## 2017-07-12 DIAGNOSIS — Z85828 Personal history of other malignant neoplasm of skin: Secondary | ICD-10-CM | POA: Insufficient documentation

## 2017-07-12 DIAGNOSIS — Z7902 Long term (current) use of antithrombotics/antiplatelets: Secondary | ICD-10-CM | POA: Diagnosis not present

## 2017-07-12 DIAGNOSIS — I481 Persistent atrial fibrillation: Secondary | ICD-10-CM | POA: Insufficient documentation

## 2017-07-12 DIAGNOSIS — Z809 Family history of malignant neoplasm, unspecified: Secondary | ICD-10-CM | POA: Insufficient documentation

## 2017-07-12 DIAGNOSIS — N183 Chronic kidney disease, stage 3 (moderate): Secondary | ICD-10-CM | POA: Diagnosis not present

## 2017-07-12 DIAGNOSIS — Z960 Presence of urogenital implants: Secondary | ICD-10-CM | POA: Insufficient documentation

## 2017-07-12 DIAGNOSIS — I252 Old myocardial infarction: Secondary | ICD-10-CM | POA: Diagnosis not present

## 2017-07-12 DIAGNOSIS — I2510 Atherosclerotic heart disease of native coronary artery without angina pectoris: Secondary | ICD-10-CM | POA: Diagnosis not present

## 2017-07-12 DIAGNOSIS — I4819 Other persistent atrial fibrillation: Secondary | ICD-10-CM

## 2017-07-12 DIAGNOSIS — Z87442 Personal history of urinary calculi: Secondary | ICD-10-CM | POA: Insufficient documentation

## 2017-07-12 DIAGNOSIS — Z91041 Radiographic dye allergy status: Secondary | ICD-10-CM | POA: Insufficient documentation

## 2017-07-12 DIAGNOSIS — Z9889 Other specified postprocedural states: Secondary | ICD-10-CM | POA: Diagnosis not present

## 2017-07-12 DIAGNOSIS — Z888 Allergy status to other drugs, medicaments and biological substances status: Secondary | ICD-10-CM | POA: Insufficient documentation

## 2017-07-12 DIAGNOSIS — Z808 Family history of malignant neoplasm of other organs or systems: Secondary | ICD-10-CM | POA: Diagnosis not present

## 2017-07-12 DIAGNOSIS — Z9842 Cataract extraction status, left eye: Secondary | ICD-10-CM | POA: Insufficient documentation

## 2017-07-12 DIAGNOSIS — Z800 Family history of malignant neoplasm of digestive organs: Secondary | ICD-10-CM | POA: Insufficient documentation

## 2017-07-12 DIAGNOSIS — I129 Hypertensive chronic kidney disease with stage 1 through stage 4 chronic kidney disease, or unspecified chronic kidney disease: Secondary | ICD-10-CM | POA: Insufficient documentation

## 2017-07-12 DIAGNOSIS — Z9841 Cataract extraction status, right eye: Secondary | ICD-10-CM | POA: Insufficient documentation

## 2017-07-12 DIAGNOSIS — Z8249 Family history of ischemic heart disease and other diseases of the circulatory system: Secondary | ICD-10-CM | POA: Diagnosis not present

## 2017-07-12 DIAGNOSIS — E785 Hyperlipidemia, unspecified: Secondary | ICD-10-CM | POA: Insufficient documentation

## 2017-07-12 LAB — BASIC METABOLIC PANEL
Anion gap: 9 (ref 5–15)
BUN: 12 mg/dL (ref 6–20)
CO2: 23 mmol/L (ref 22–32)
Calcium: 9.5 mg/dL (ref 8.9–10.3)
Chloride: 110 mmol/L (ref 101–111)
Creatinine, Ser: 1.03 mg/dL — ABNORMAL HIGH (ref 0.44–1.00)
GFR calc Af Amer: 53 mL/min — ABNORMAL LOW (ref >60)
GFR calc non Af Amer: 46 mL/min — ABNORMAL LOW (ref >60)
Glucose, Bld: 112 mg/dL — ABNORMAL HIGH (ref 65–99)
Potassium: 4.3 mmol/L (ref 3.5–5.1)
Sodium: 142 mmol/L (ref 135–145)

## 2017-07-12 LAB — CBC
HCT: 37.3 % (ref 36.0–46.0)
Hemoglobin: 11.2 g/dL — ABNORMAL LOW (ref 12.0–15.0)
MCH: 25.7 pg — ABNORMAL LOW (ref 26.0–34.0)
MCHC: 30 g/dL (ref 30.0–36.0)
MCV: 85.7 fL (ref 78.0–100.0)
Platelets: 234 10*3/uL (ref 150–400)
RBC: 4.35 MIL/uL (ref 3.87–5.11)
RDW: 17.3 % — ABNORMAL HIGH (ref 11.5–15.5)
WBC: 5.6 10*3/uL (ref 4.0–10.5)

## 2017-07-12 NOTE — H&P (View-Only) (Signed)
Primary Care Physician: [name], MD Referring Physician: [facility] ER f/u Cardiologist: Shelly Obrien is a 84 y.o. female with a h/o CAD, CKD, PAF on Xarelto, HTN, and HLD since to the emergency department for evaluation of exertional dyspnea and generalized weakness worsening over the past several days. Patient stated that she has had intermittent atrial fibrillation and had her Xarelto discontinued by her cardiologist 1 week ago with concern for possible rash development. She was given rx for pradaxa but did not start 2/2 cost of drug. She denied chest pain, palpitations, shortness of breath.  No other modifying factors.  She restarted her Xarelto yesterday, she thought  that stopping this medication may have brought about her symptoms.  She continues to be compliant with her metoprolol and amlodipine.   She is in the afib clinic for f/u. She remains in afib at v rate of 107 bpm.  She was suppose to increase metoprolol to 50 mg bid, per ER instructions, but wanted to wait until today to clarify dose.  Shealso wants to try eliquis instead of using more expensive pradaxa, but explained to pt that both drugs are Factor X A inhibitors and she may experience rash with it as well, but she wants to try. Has had the rash x one year, shortly after starting xarelto. Feels well at rest but notices shortness of breath and fatigue with exertion.  F/u in the afib clinic, 2/4. She went to the local fire station yesterday, as she felt jittery on Sunday. A strip was done that showed afib at 88 bpm. EMS was called and they talked to pt and encouraged her to be seen in the office today instead of Wednesday. [name], she called to the office on Friday and was complaining that the BB was making her nose run and have a cough. She was changed to Cardizem 120 mg a day and BB was stopped. She is in afib with RVR today at 127 bpm, but states that she feels better with less runny nose and cough with stopping BB and  is having less itching with change to xarelto.Overall, she appears to be tolerating the afib well. She  will not be able to have cardioversion until after 2/13, as she did not restart anticoagulation until 1/23.  F/u in afib clinic, She is rate controlled afib with Cardizem 120 mg bid. She feels much better. We discussed cardioversion and she would like to proceed. She has been on uninterrupted anticoagulation since 1/22, she was switched to eliquis but did not miss any doses in the transition. She feels rash is improved on Eliquis.  Today, she denies symptoms of palpitations, chest pain, shortness of breath, orthopnea, PND, lower extremity edema, dizziness, presyncope, syncope, or neurologic sequela. The patient is tolerating medications without difficulties and is otherwise without complaint today.   Past Medical History:  Diagnosis Date  . Anticoagulant long-term use    xarelto  . CAD S/P percutaneous coronary angioplasty cardiologist-  dr Dorris Carnes (previous cardiologist- dr Lia Foyer)   hx MI 04/ 1994  s/p  PCI to LAD  . CKD (chronic kidney disease), stage III ([place])   . Complication of anesthesia    hard to wake   . History of basal cell carcinoma (BCC) excision    right eye area 08/ 2013  . History of kidney stones 01/20/2017  . History of MI (myocardial infarction) 09/1992   s/p  PCI to LAD  . History of squamous cell  carcinoma excision    right lower leg 2015 ;  2016  . Hyperlipidemia   . Hypertension   . Macular degeneration   . PAF (paroxysmal atrial fibrillation) ([place]) dx 02-24-2016   followed by cardiologist-- dr Mamie Nick. [name]  . PONV (postoperative nausea and vomiting)   . Right ureteral stone   . Wears glasses    Past Surgical History:  Procedure Laterality Date  . ABDOMINAL HYSTERECTOMY    . BREAST EXCISIONAL BIOPSY Left 11/02/2009   fibroadenoma (lumpectomy)  . BROW LIFT Right 04/07/2014   Procedure: REPAIR OF ENTROPION AND TRICHIASIS OF RIGHT LOWER EYE LID ;  Surgeon:  Theodoro Kos, DO;  Location: [place];  Service: Plastics;  Laterality: Right;  . CATARACT EXTRACTION W/ INTRAOCULAR LENS  IMPLANT, BILATERAL    . CORONARY ANGIOPLASTY  04/ 1994  dr Lia Foyer   PCI to LAD  . CYSTOSCOPY W/ URETERAL STENT PLACEMENT Right 01/20/2017   Procedure: CYSTOSCOPY WITH RIGHT RETROGRADE PYELOGRAM/RIGHT URETERAL STENT PLACEMENT;  Surgeon: Alexis Frock, MD;  Location: [facility];  Service: Urology;  Laterality: Right;  . CYSTOSCOPY WITH RETROGRADE PYELOGRAM, URETEROSCOPY AND STENT PLACEMENT Right 02/15/2017   Procedure: CYSTOSCOPY WITH RETROGRADE PYELOGRAM, URETEROSCOPY, STONE BASKETRY AND STENT REPLACEMENT;  Surgeon: Alexis Frock, MD;  Location: [facility];  Service: Urology;  Laterality: Right;  . CYSTOSCOPY/RETROGRADE/URETEROSCOPY/STONE EXTRACTION WITH BASKET  yrs ago  . DILATION AND CURETTAGE OF UTERUS    . HOLMIUM LASER APPLICATION Right 0/93/2355   Procedure: HOLMIUM LASER APPLICATION;  Surgeon: Alexis Frock, MD;  Location[facility];  Service: Urology;  Laterality: Right;  . KNEE SURGERY     right  . NEUROPLASTY / TRANSPOSITION ULNAR NERVE AT ELBOW Left 02-23-2000    [facility]   and Left Carpal Tunnel Release  . TRANSTHORACIC ECHOCARDIOGRAM  02/26/2016   moderate focal basal and mild concentric LVH,  ef 55-60%,  akinesis of the basal-midanteroseptal, inferoseptal and apicalinferior myocardium, study not sufficient to evaluation diastolic funciton due to atrial fib./  trivial PR/  mild TR    Current Outpatient Medications  Medication Sig Dispense Refill  . acetaminophen (TYLENOL) 325 MG tablet Take 650 mg by mouth every 6 (six) hours as needed for mild pain.    [name] apixaban (ELIQUIS) 5 MG TABS tablet Take 1 tablet (5 mg total) by mouth 2 (two) times daily. 60 tablet 0  . diltiazem (CARDIZEM CD) 120 MG 24 hr capsule Take 1 capsule (120 mg total) by mouth 2 (two) times daily. 30 capsule 3  . Multiple Vitamins-Minerals  (ICAPS AREDS 2) CAPS Take 1 capsule by mouth daily.    Vladimir Faster Glycol-Propyl Glycol (SYSTANE OP) Place 1 drop into both eyes daily as needed (itchy eyes).    . traMADol (ULTRAM) 50 MG tablet Take 1 tablet (50 mg total) by mouth every 6 (six) hours as needed for moderate pain or severe pain. Post-operatively. 15 tablet 0  . triamcinolone (NASACORT ALLERGY 24HR) 55 MCG/ACT AERO nasal inhaler Place 1-2 sprays into the nose 2 (two) times daily as needed (for rhinitis).      No current facility-administered medications for this encounter.     Allergies  Allergen Reactions  . Antihistamines, Chlorpheniramine-Type Other (See Comments)    Pt states that she passed out.   [name] Penicillins Anaphylaxis and Other (See Comments)    Has patient had a PCN reaction causing immediate rash, facial/tongue/throat swelling, SOB or lightheadedness with hypotension: Yes Has patient had a PCN reaction causing severe  rash involving mucus membranes or skin necrosis: No Has patient had a PCN reaction that required hospitalization No Has patient had a PCN reaction occurring within the last 10 years: No If all of the above answers are "NO", then may proceed with Cephalosporin use.  . Contrast Media [Iodinated Diagnostic Agents] Hives  . Sulfonamide Derivatives Other (See Comments)    Reaction:  Unknown   . Atorvastatin Other (See Comments)    Reaction:  Unknown   . Pheniramine Other (See Comments)    Pt states that she passed out.     Social History   Socioeconomic History  . Marital status: Widowed    Spouse name: Not on file  . Number of children: 2  . Years of education: Not on file  . Highest education level: Not on file  Social Needs  . Financial resource strain: Not on file  . Food insecurity - worry: Not on file  . Food insecurity - inability: Not on file  . Transportation needs - medical: Not on file  . Transportation needs - non-medical: Not on file  Occupational History  . Occupation: Retired   Tobacco Use  . Smoking status: Never Smoker  . Smokeless tobacco: Never Used  Substance and Sexual Activity  . Alcohol use: No    Alcohol/week: 0.0 oz  . Drug use: No  . Sexual activity: Not on file  Other Topics Concern  . Not on file  Social History Narrative   Lives at home    Family History  Problem Relation Age of Onset  . Coronary artery disease Unknown        positive family hx of  . Cancer Unknown        family hx of  . Brain cancer Son   . Liver cancer Daughter     ROS- All systems are reviewed and negative except as per the HPI above  Physical Exam: Vitals:   07/12/17 1031  BP: (!) 146/82  Pulse: 88  Weight: 167 lb (75.8 kg)  Height: 5\' 5"  (1.651 m)   Wt Readings from Last 3 Encounters:  07/12/17 167 lb (75.8 kg)  07/08/17 165 lb (74.8 kg)  07/01/17 166 lb (75.3 kg)    Labs: Lab Results  Component Value Date   NA 141 06/26/2017   K 4.8 06/26/2017   CL 108 06/26/2017   CO2 22 06/26/2017   GLUCOSE 113 (H) 06/26/2017   BUN 19 06/26/2017   CREATININE 1.16 (H) 06/26/2017   CALCIUM 9.8 06/26/2017   MG 2.1 06/26/2017   No results found for: INR No results found for: CHOL, HDL, LDLCALC, TRIG   GEN- The patient is well appearing, alert and oriented x 3 today.   Head- normocephalic, atraumatic Eyes-  Sclera clear, conjunctiva pink Ears- hearing intact Oropharynx- clear Neck- supple, no JVP Lymph- no cervical lymphadenopathy Lungs- Clear to ausculation bilaterally, normal work of breathing Heart- irregular rate and rhythm, no murmurs, rubs or gallops, PMI not laterally displaced GI- soft, NT, ND, + BS Extremities- no clubbing, cyanosis, or edema MS- no significant deformity or atrophy Skin- no rash or lesion Psych- euthymic mood, full affect Neuro- strength and sensation are intact  EKG-Afib at 88 bpm, qrs int 96 ms, qtc 457 bpm,  Epic records reviewed    Assessment and Plan: 1. Persistent  afib Stopped BB 2/2 to runny nose and  cough Continue on cardizem 120 mg a day bid with good rate control On eliquis 5 mg bid for chadsvasc  score of at least 5, changed from [place], thinks rash is improving Cardioversion, risks vrs benefit dicussed with pt and is scheduled on 2/14, will have  been on uninterrupted  anticoagulation x three weeks on 2/13  2. HTN Stable   F/u one week after cardioversion  Shelly Obrien, [facility] [address] [place], [place] [address] [phone]

## 2017-07-12 NOTE — Progress Notes (Signed)
Primary Care Physician: [name], MD Referring Physician: [facility] ER f/u Cardiologist: [name] is a 84 y.o. female with a h/o CAD, CKD, PAF on Xarelto, HTN, and HLD since to [place] emergency department for evaluation of exertional dyspnea and generalized weakness worsening over [place] past several days. Obrien stated that she has had intermittent atrial fibrillation and had her Xarelto discontinued by her cardiologist 1 week ago with concern for possible rash development. She was given rx for pradaxa but did not start 2/2 cost of drug. She denied chest pain, palpitations, shortness of breath.  No other modifying factors.  She restarted her Xarelto yesterday, she thought  that stopping this medication may have brought about her symptoms.  She continues to be compliant with her metoprolol and amlodipine.   She is in [facility] afib clinic for f/u. She remains in afib at v rate of 107 bpm.  She was suppose to increase metoprolol to 50 mg bid, per ER instructions, but wanted to wait until today to clarify dose.  Shealso wants to try eliquis instead of using more expensive pradaxa, but explained to pt that both drugs are Factor X A inhibitors and she may experience rash with it as well, but she wants to try. Has had [place] rash x one year, shortly after starting xarelto. Feels well at rest but notices shortness of breath and fatigue with exertion.  F/u in [place] afib clinic, 2/4. She went to [place] local fire station yesterday, as she felt jittery on Sunday. A strip was done that showed afib at 88 bpm. EMS was called and they talked to pt and encouraged her to be seen in [place] office today instead of Wednesday. [name], she called to [place] office on Friday and was complaining that [place] BB was making her nose run and have a cough. She was changed to Cardizem 120 mg a day and BB was stopped. She is in afib with RVR today at 127 bpm, but states that she feels better with less runny nose and cough with stopping BB and  is having less itching with change to xarelto.Overall, she appears to be tolerating [place] afib well. She  will not be able to have cardioversion until after 2/13, as she did not restart anticoagulation until 1/23.  F/u in afib clinic, She is rate controlled afib with Cardizem 120 mg bid. She feels much better. We discussed cardioversion and she would like to proceed. She has been on uninterrupted anticoagulation since 1/22, she was switched to eliquis but did not miss any doses in [facility] transition. She feels rash is improved on Eliquis.  Today, she denies symptoms of palpitations, chest pain, shortness of breath, orthopnea, PND, lower extremity edema, dizziness, presyncope, syncope, or neurologic sequela. [place] Obrien is tolerating medications without difficulties and is otherwise without complaint today.   Past Medical History:  Diagnosis Date  . Anticoagulant long-term use    xarelto  . CAD S/P percutaneous coronary angioplasty cardiologist-  dr Dorris Carnes (previous cardiologist- dr Lia Foyer)   hx MI 04/ 1994  s/p  PCI to LAD  . CKD (chronic kidney disease), stage III ([place])   . Complication of anesthesia    hard to wake   . History of basal cell carcinoma (BCC) excision    right eye area 08/ 2013  . History of kidney stones 01/20/2017  . History of MI (myocardial infarction) 09/1992   s/p  PCI to LAD  . History of squamous cell  carcinoma excision    right lower leg 2015 ;  2016  . Hyperlipidemia   . Hypertension   . Macular degeneration   . PAF (paroxysmal atrial fibrillation) ([place]) dx 02-24-2016   followed by cardiologist-- dr Mamie Nick. [name]  . PONV (postoperative nausea and vomiting)   . Right ureteral stone   . Wears glasses    Past Surgical History:  Procedure Laterality Date  . ABDOMINAL HYSTERECTOMY    . BREAST EXCISIONAL BIOPSY Left 11/02/2009   fibroadenoma (lumpectomy)  . BROW LIFT Right 04/07/2014   Procedure: REPAIR OF ENTROPION AND TRICHIASIS OF RIGHT LOWER EYE LID ;  Surgeon:  Theodoro Kos, DO;  Location: [facility];  Service: Plastics;  Laterality: Right;  . CATARACT EXTRACTION W/ INTRAOCULAR LENS  IMPLANT, BILATERAL    . CORONARY ANGIOPLASTY  04/ 1994  dr Lia Foyer   PCI to LAD  . CYSTOSCOPY W/ URETERAL STENT PLACEMENT Right 01/20/2017   Procedure: CYSTOSCOPY WITH RIGHT RETROGRADE PYELOGRAM/RIGHT URETERAL STENT PLACEMENT;  Surgeon: Alexis Frock, MD;  Location: [facility];  Service: Urology;  Laterality: Right;  . CYSTOSCOPY WITH RETROGRADE PYELOGRAM, URETEROSCOPY AND STENT PLACEMENT Right 02/15/2017   Procedure: CYSTOSCOPY WITH RETROGRADE PYELOGRAM, URETEROSCOPY, STONE BASKETRY AND STENT REPLACEMENT;  Surgeon: Alexis Frock, MD;  Location: [facility];  Service: Urology;  Laterality: Right;  . CYSTOSCOPY/RETROGRADE/URETEROSCOPY/STONE EXTRACTION WITH BASKET  yrs ago  . DILATION AND CURETTAGE OF UTERUS    . HOLMIUM LASER APPLICATION Right 9/89/2119   Procedure: HOLMIUM LASER APPLICATION;  Surgeon: Alexis Frock, MD;  Location: [facility];  Service: Urology;  Laterality: Right;  . KNEE SURGERY     right  . NEUROPLASTY / TRANSPOSITION ULNAR NERVE AT ELBOW Left 02-23-2000    [facility]   and Left Carpal Tunnel Release  . TRANSTHORACIC ECHOCARDIOGRAM  02/26/2016   moderate focal basal and mild concentric LVH,  ef 55-60%,  akinesis of [place] basal-midanteroseptal, inferoseptal and apicalinferior myocardium, study not sufficient to evaluation diastolic funciton due to atrial fib./  trivial PR/  mild TR    Current Outpatient Medications  Medication Sig Dispense Refill  . acetaminophen (TYLENOL) 325 MG tablet Take 650 mg by mouth every 6 (six) hours as needed for mild pain.    [name] apixaban (ELIQUIS) 5 MG TABS tablet Take 1 tablet (5 mg total) by mouth 2 (two) times daily. 60 tablet 0  . diltiazem (CARDIZEM CD) 120 MG 24 hr capsule Take 1 capsule (120 mg total) by mouth 2 (two) times daily. 30 capsule 3  . Multiple Vitamins-Minerals  (ICAPS AREDS 2) CAPS Take 1 capsule by mouth daily.    Vladimir Faster Glycol-Propyl Glycol (SYSTANE OP) Place 1 drop into both eyes daily as needed (itchy eyes).    . traMADol (ULTRAM) 50 MG tablet Take 1 tablet (50 mg total) by mouth every 6 (six) hours as needed for moderate pain or severe pain. Post-operatively. 15 tablet 0  . triamcinolone (NASACORT ALLERGY 24HR) 55 MCG/ACT AERO nasal inhaler Place 1-2 sprays into [place] nose 2 (two) times daily as needed (for rhinitis).      No current facility-administered medications for this encounter.     Allergies  Allergen Reactions  . Antihistamines, Chlorpheniramine-Type Other (See Comments)    Pt states that she passed out.   [name] Penicillins Anaphylaxis and Other (See Comments)    Has Obrien had a PCN reaction causing immediate rash, facial/tongue/throat swelling, SOB or lightheadedness with hypotension: Yes Has Obrien had a PCN reaction causing severe  rash involving mucus membranes or skin necrosis: No Has Obrien had a PCN reaction that required hospitalization No Has Obrien had a PCN reaction occurring within [place] last 10 years: No If all of [place] above answers are "NO", then may proceed with Cephalosporin use.  . Contrast Media [Iodinated Diagnostic Agents] Hives  . Sulfonamide Derivatives Other (See Comments)    Reaction:  Unknown   . Atorvastatin Other (See Comments)    Reaction:  Unknown   . Pheniramine Other (See Comments)    Pt states that she passed out.     Social History   Socioeconomic History  . Marital status: Widowed    Spouse name: Not on file  . Number of children: 2  . Years of education: Not on file  . Highest education level: Not on file  Social Needs  . Financial resource strain: Not on file  . Food insecurity - worry: Not on file  . Food insecurity - inability: Not on file  . Transportation needs - medical: Not on file  . Transportation needs - non-medical: Not on file  Occupational History  . Occupation: Retired   Tobacco Use  . Smoking status: Never Smoker  . Smokeless tobacco: Never Used  Substance and Sexual Activity  . Alcohol use: No    Alcohol/week: 0.0 oz  . Drug use: No  . Sexual activity: Not on file  Other Topics Concern  . Not on file  Social History Narrative   Lives at home    Family History  Problem Relation Age of Onset  . Coronary artery disease Unknown        positive family hx of  . Cancer Unknown        family hx of  . Brain cancer Son   . Liver cancer Daughter     ROS- All systems are reviewed and negative except as per [place] HPI above  Physical Exam: Vitals:   07/12/17 1031  BP: (!) 146/82  Pulse: 88  Weight: 167 lb (75.8 kg)  Height: 5\' 5"  (1.651 m)   Wt Readings from Last 3 Encounters:  07/12/17 167 lb (75.8 kg)  07/08/17 165 lb (74.8 kg)  07/01/17 166 lb (75.3 kg)    Labs: Lab Results  Component Value Date   NA 141 06/26/2017   K 4.8 06/26/2017   CL 108 06/26/2017   CO2 22 06/26/2017   GLUCOSE 113 (H) 06/26/2017   BUN 19 06/26/2017   CREATININE 1.16 (H) 06/26/2017   CALCIUM 9.8 06/26/2017   MG 2.1 06/26/2017   No results found for: INR No results found for: CHOL, HDL, LDLCALC, TRIG   GEN- [place] Obrien is well appearing, alert and oriented x 3 today.   Head- normocephalic, atraumatic Eyes-  Sclera clear, conjunctiva pink Ears- hearing intact Oropharynx- clear Neck- supple, no JVP Lymph- no cervical lymphadenopathy Lungs- Clear to ausculation bilaterally, normal work of breathing Heart- irregular rate and rhythm, no murmurs, rubs or gallops, PMI not laterally displaced GI- soft, NT, ND, + BS Extremities- no clubbing, cyanosis, or edema MS- no significant deformity or atrophy Skin- no rash or lesion Psych- euthymic mood, full affect Neuro- strength and sensation are intact  EKG-Afib at 88 bpm, qrs int 96 ms, qtc 457 bpm,  Epic records reviewed    Assessment and Plan: 1. Persistent  afib Stopped BB 2/2 to runny nose and  cough Continue on cardizem 120 mg a day bid with good rate control On eliquis 5 mg bid for chadsvasc  score of at least 5, changed from [place], thinks rash is improving Cardioversion, risks vrs benefit dicussed with pt and is scheduled on 2/14, will have  been on uninterrupted  anticoagulation x three weeks on 2/13  2. HTN Stable   F/u one week after cardioversion  [place] Obrien, [facility] [address] [place], [place] [address] [phone]

## 2017-07-12 NOTE — Patient Instructions (Signed)
Cardioversion scheduled for Thursday, February 14th  - Arrive at the [facility] and go to admitting at 12:30PM  -Do not eat or drink anything after midnight the night prior to your procedure.  - Take all your medication with a sip of water prior to arrival.  - You will not be able to drive home after your procedure.

## 2017-07-15 ENCOUNTER — Ambulatory Visit (HOSPITAL_COMMUNITY): Payer: PRIVATE HEALTH INSURANCE | Admitting: Nurse Practitioner

## 2017-07-18 ENCOUNTER — Encounter (HOSPITAL_COMMUNITY): Payer: Self-pay | Admitting: Certified Registered Nurse Anesthetist

## 2017-07-18 ENCOUNTER — Ambulatory Visit (HOSPITAL_COMMUNITY)
Admission: RE | Admit: 2017-07-18 | Discharge: 2017-07-18 | Disposition: A | Discharge status: Home / Self Care | Payer: Medicare Other | Source: Ambulatory Visit | Attending: Cardiovascular Disease | Admitting: Cardiovascular Disease

## 2017-07-18 ENCOUNTER — Encounter (HOSPITAL_COMMUNITY)
Admission: RE | Disposition: A | Discharge status: Home / Self Care | Payer: Self-pay | Source: Ambulatory Visit | Attending: Cardiovascular Disease

## 2017-07-18 ENCOUNTER — Ambulatory Visit (HOSPITAL_COMMUNITY): Payer: Medicare Other | Admitting: Anesthesiology

## 2017-07-18 DIAGNOSIS — Z91041 Radiographic dye allergy status: Secondary | ICD-10-CM | POA: Diagnosis not present

## 2017-07-18 DIAGNOSIS — Z880 Allergy status to penicillin: Secondary | ICD-10-CM | POA: Diagnosis not present

## 2017-07-18 DIAGNOSIS — Z85828 Personal history of other malignant neoplasm of skin: Secondary | ICD-10-CM | POA: Insufficient documentation

## 2017-07-18 DIAGNOSIS — Z882 Allergy status to sulfonamides status: Secondary | ICD-10-CM | POA: Diagnosis not present

## 2017-07-18 DIAGNOSIS — N183 Chronic kidney disease, stage 3 (moderate): Secondary | ICD-10-CM | POA: Insufficient documentation

## 2017-07-18 DIAGNOSIS — I129 Hypertensive chronic kidney disease with stage 1 through stage 4 chronic kidney disease, or unspecified chronic kidney disease: Secondary | ICD-10-CM | POA: Diagnosis not present

## 2017-07-18 DIAGNOSIS — Z87442 Personal history of urinary calculi: Secondary | ICD-10-CM | POA: Insufficient documentation

## 2017-07-18 DIAGNOSIS — E785 Hyperlipidemia, unspecified: Secondary | ICD-10-CM | POA: Diagnosis not present

## 2017-07-18 DIAGNOSIS — Z79899 Other long term (current) drug therapy: Secondary | ICD-10-CM | POA: Diagnosis not present

## 2017-07-18 DIAGNOSIS — Z9861 Coronary angioplasty status: Secondary | ICD-10-CM | POA: Insufficient documentation

## 2017-07-18 DIAGNOSIS — I2510 Atherosclerotic heart disease of native coronary artery without angina pectoris: Secondary | ICD-10-CM | POA: Insufficient documentation

## 2017-07-18 DIAGNOSIS — Z7901 Long term (current) use of anticoagulants: Secondary | ICD-10-CM | POA: Insufficient documentation

## 2017-07-18 DIAGNOSIS — Z888 Allergy status to other drugs, medicaments and biological substances status: Secondary | ICD-10-CM | POA: Diagnosis not present

## 2017-07-18 DIAGNOSIS — I481 Persistent atrial fibrillation: Secondary | ICD-10-CM | POA: Diagnosis not present

## 2017-07-18 DIAGNOSIS — I252 Old myocardial infarction: Secondary | ICD-10-CM | POA: Insufficient documentation

## 2017-07-18 DIAGNOSIS — I4891 Unspecified atrial fibrillation: Secondary | ICD-10-CM | POA: Diagnosis not present

## 2017-07-18 DIAGNOSIS — I480 Paroxysmal atrial fibrillation: Secondary | ICD-10-CM | POA: Diagnosis not present

## 2017-07-18 HISTORY — PX: CARDIOVERSION: SHX1299

## 2017-07-18 SURGERY — CARDIOVERSION
Anesthesia: General

## 2017-07-18 MED ORDER — LIDOCAINE 2% (20 MG/ML) 5 ML SYRINGE
INTRAMUSCULAR | Status: DC | PRN
  Administered 2017-07-18: 60 mg via INTRAVENOUS

## 2017-07-18 MED ORDER — SODIUM CHLORIDE 0.9 % IV SOLN
INTRAVENOUS | Status: AC | PRN
  Administered 2017-07-18: 500 mL via INTRAVENOUS

## 2017-07-18 MED ORDER — PROPOFOL 10 MG/ML IV BOLUS
INTRAVENOUS | Status: DC | PRN
  Administered 2017-07-18: 50 mg via INTRAVENOUS

## 2017-07-18 NOTE — Transfer of Care (Signed)
Immediate Anesthesia Transfer of Care Note  Patient: [place] Obrien  Procedure(s) Performed: CARDIOVERSION (N/A )  Patient Location: Endoscopy Unit  Anesthesia Type:General  Level of Consciousness: drowsy and patient cooperative  Airway & Oxygen Therapy: Patient Spontanous Breathing  Post-op Assessment: Report given to RN and Post -op Vital signs reviewed and stable  Post vital signs: Reviewed and stable  Last Vitals:  Vitals:   07/18/17 1339 07/18/17 1340  BP:    Pulse: 74 76  Resp: 18 14  Temp:    SpO2: 100% 100%    Last Pain:  Vitals:   07/18/17 1322  TempSrc: Oral         Complications: No apparent anesthesia complications

## 2017-07-18 NOTE — Discharge Instructions (Signed)
Electrical Cardioversion, Care After °This sheet gives you information about how to care for yourself after your procedure. Your health care provider may also give you more specific instructions. If you have problems or questions, contact your health care provider. °What can I expect after the procedure? °After the procedure, it is common to have: °· Some redness on the skin where the shocks were given. ° °Follow these instructions at home: °· Do not drive for 24 hours if you were given a medicine to help you relax (sedative). °· Take over-the-counter and prescription medicines only as told by your health care provider. °· Ask your health care provider how to check your pulse. Check it often. °· Rest for 48 hours after the procedure or as told by your health care provider. °· Avoid or limit your caffeine use as told by your health care provider. °Contact a health care provider if: °· You feel like your heart is beating too quickly or your pulse is not regular. °· You have a serious muscle cramp that does not go away. °Get help right away if: °· You have discomfort in your chest. °· You are dizzy or you feel faint. °· You have trouble breathing or you are short of breath. °· Your speech is slurred. °· You have trouble moving an arm or leg on one side of your body. °· Your fingers or toes turn cold or blue. °This information is not intended to replace advice given to you by your health care provider. Make sure you discuss any questions you have with your health care provider. °Document Released: 03/11/2013 Document Revised: 12/23/2015 Document Reviewed: 11/25/2015 °Elsevier Interactive Patient Education © 2018 [employer] ° °

## 2017-07-18 NOTE — Anesthesia Procedure Notes (Signed)
Procedure Name: General with mask airway Date/Time: 07/18/2017 1:37 PM Performed by: Colin Benton, CRNA Pre-anesthesia Checklist: Patient identified, Emergency Drugs available, Suction available, Patient being monitored and Timeout performed Patient Re-evaluated:Patient Re-evaluated prior to induction Oxygen Delivery Method: Ambu bag Preoxygenation: Pre-oxygenation with 100% oxygen Induction Type: IV induction Ventilation: Mask ventilation without difficulty

## 2017-07-18 NOTE — Anesthesia Postprocedure Evaluation (Signed)
Anesthesia Post Note  Patient: [place] Obrien  Procedure(s) Performed: CARDIOVERSION (N/A )     Patient location during evaluation: PACU Anesthesia Type: General Level of consciousness: sedated Pain management: pain level controlled Vital Signs Assessment: post-procedure vital signs reviewed and stable Respiratory status: spontaneous breathing and respiratory function stable Cardiovascular status: stable Postop Assessment: no apparent nausea or vomiting Anesthetic complications: no    Last Vitals:  Vitals:   07/18/17 1347 07/18/17 1349  BP:    Pulse: 76 73  Resp: 11 11  Temp:  36.5 C  SpO2: 96% 96%    Last Pain:  Vitals:   07/18/17 1349  TempSrc: Oral                 Bronco Mcgrory DANIEL

## 2017-07-18 NOTE — Anesthesia Preprocedure Evaluation (Signed)
Anesthesia Evaluation  Patient identified by MRN, date of birth, ID band Patient awake    Reviewed: Allergy & Precautions, NPO status , Patient's Chart, lab work & pertinent test results  History of Anesthesia Complications (+) PONV and history of anesthetic complications  Airway Mallampati: II  TM Distance: >3 FB Neck ROM: Full    Dental no notable dental hx. (+) Teeth Intact, Caps, Dental Advisory Given,    Pulmonary neg pulmonary ROS,    Pulmonary exam normal breath sounds clear to auscultation       Cardiovascular hypertension, Pt. on medications + CAD and + Past MI  Normal cardiovascular exam+ dysrhythmias Atrial Fibrillation  Rhythm:Irregular Rate:Normal  02/2016  Left ventricle:  The cavity size was normal. There was moderate focal basal and mild concentric hypertrophy. Systolic function was normal. The estimated ejection fraction was in the range of 55% to 60%.  Regional wall motion abnormalities:   There is akinesis of the basal-midanteroseptal and inferoseptal myocardium.  There is akinesis of the apicalinferior myocardium. Normal sinus rhythm was absent. The study was not technically sufficient to allow evaluation of LV diastolic dysfunction due to atrial fibrillation.   ------------------------------------------------------------------- Aortic valve:   Trileaflet; mildly thickened, moderately calcified leaflets. Mobility was not restricted.  Doppler:  Transvalvular velocity was within the normal range. There was no stenosis. There was no regurgitation.  ------------------------------------------------------------------- Aorta:  Aortic root: The aortic root was normal in size.  ------------------------------------------------------------------- Mitral valve:   Structurally normal valve.   Mobility was not restricted.  Doppler:  Transvalvular velocity was within the normal range. There was no evidence for  stenosis. There was no regurgitation.    Peak gradient (D): 3 mm Hg   Neuro/Psych negative neurological ROS  negative psych ROS   GI/Hepatic negative GI ROS, Neg liver ROS,   Endo/Other  negative endocrine ROS  Renal/GU Renal InsufficiencyRenal disease  negative genitourinary   Musculoskeletal negative musculoskeletal ROS (+)   Abdominal   Peds negative pediatric ROS (+)  Hematology negative hematology ROS (+)   Anesthesia Other Findings   Reproductive/Obstetrics negative OB ROS                             Anesthesia Physical  Anesthesia Plan  ASA: III  Anesthesia Plan: General   Post-op Pain Management:    Induction: Intravenous  PONV Risk Score and Plan: Ondansetron and Treatment may vary due to age or medical condition  Airway Management Planned: Mask  Additional Equipment:   Intra-op Plan:   Post-operative Plan:   Informed Consent:   Plan Discussed with: CRNA and Surgeon  Anesthesia Plan Comments:         Anesthesia Quick Evaluation

## 2017-07-18 NOTE — CV Procedure (Signed)
Electrical Cardioversion Procedure Note MORGAN KEINATH [id number] 08-21-1922  Procedure: Electrical Cardioversion Indications:  Atrial Fibrillation  Procedure Details Consent: Risks of procedure as well as the alternatives and risks of each were explained to the (patient/caregiver).  Consent for procedure obtained. Time Out: Verified patient identification, verified procedure, site/side was marked, verified correct patient position, special equipment/implants available, medications/allergies/relevent history reviewed, required imaging and test results available.  Performed  Patient placed on cardiac monitor, pulse oximetry, supplemental oxygen as necessary.  Sedation given: propofol Pacer pads placed anterior and posterior chest.  Cardioverted 1 time(s).  Cardioverted at 150J.  Evaluation Findings: Post procedure EKG shows: NSR Complications: None Patient did tolerate procedure well.   Skeet Latch, MD 07/18/2017, 1:39 PM

## 2017-07-18 NOTE — Interval H&P Note (Signed)
History and Physical Interval Note:  07/18/2017 1:37 PM  [place] Obrien  has presented today for surgery, with the diagnosis of afib  The various methods of treatment have been discussed with the patient and family. After consideration of risks, benefits and other options for treatment, the patient has consented to  Procedure(s): CARDIOVERSION (N/A) as a surgical intervention .  The patient's history has been reviewed, patient examined, no change in status, stable for surgery.  I have reviewed the patient's chart and labs.  Questions were answered to the patient's satisfaction.     Skeet Latch, MD

## 2017-07-19 ENCOUNTER — Other Ambulatory Visit (HOSPITAL_COMMUNITY): Payer: Self-pay | Admitting: *Deleted

## 2017-07-19 ENCOUNTER — Encounter (HOSPITAL_COMMUNITY): Payer: Self-pay | Admitting: Cardiovascular Disease

## 2017-07-19 MED ORDER — DILTIAZEM HCL ER COATED BEADS 120 MG PO CP24: 120.0000 mg | ORAL_CAPSULE | Freq: Two times a day (BID) | ORAL | 3 refills | Status: DC

## 2017-07-25 ENCOUNTER — Ambulatory Visit (HOSPITAL_COMMUNITY)
Admission: RE | Admit: 2017-07-25 | Discharge: 2017-07-25 | Disposition: A | Discharge status: Home / Self Care | Payer: Medicare Other | Source: Ambulatory Visit | Attending: Nurse Practitioner | Admitting: Nurse Practitioner

## 2017-07-25 ENCOUNTER — Encounter (HOSPITAL_COMMUNITY): Payer: Self-pay | Admitting: Nurse Practitioner

## 2017-07-25 VITALS — BP 134/78 | HR 75 | Ht 65.0 in | Wt 165.4 lb

## 2017-07-25 DIAGNOSIS — H26492 Other secondary cataract, left eye: Secondary | ICD-10-CM | POA: Diagnosis not present

## 2017-07-25 DIAGNOSIS — N183 Chronic kidney disease, stage 3 (moderate): Secondary | ICD-10-CM | POA: Diagnosis not present

## 2017-07-25 DIAGNOSIS — I129 Hypertensive chronic kidney disease with stage 1 through stage 4 chronic kidney disease, or unspecified chronic kidney disease: Secondary | ICD-10-CM | POA: Diagnosis not present

## 2017-07-25 DIAGNOSIS — H353211 Exudative age-related macular degeneration, right eye, with active choroidal neovascularization: Secondary | ICD-10-CM | POA: Diagnosis not present

## 2017-07-25 DIAGNOSIS — Z7901 Long term (current) use of anticoagulants: Secondary | ICD-10-CM | POA: Insufficient documentation

## 2017-07-25 DIAGNOSIS — Z79899 Other long term (current) drug therapy: Secondary | ICD-10-CM | POA: Diagnosis not present

## 2017-07-25 DIAGNOSIS — Z961 Presence of intraocular lens: Secondary | ICD-10-CM | POA: Diagnosis not present

## 2017-07-25 DIAGNOSIS — I2510 Atherosclerotic heart disease of native coronary artery without angina pectoris: Secondary | ICD-10-CM | POA: Insufficient documentation

## 2017-07-25 DIAGNOSIS — I252 Old myocardial infarction: Secondary | ICD-10-CM | POA: Diagnosis not present

## 2017-07-25 DIAGNOSIS — Z882 Allergy status to sulfonamides status: Secondary | ICD-10-CM | POA: Insufficient documentation

## 2017-07-25 DIAGNOSIS — Z9849 Cataract extraction status, unspecified eye: Secondary | ICD-10-CM | POA: Diagnosis not present

## 2017-07-25 DIAGNOSIS — E785 Hyperlipidemia, unspecified: Secondary | ICD-10-CM | POA: Insufficient documentation

## 2017-07-25 DIAGNOSIS — H353221 Exudative age-related macular degeneration, left eye, with active choroidal neovascularization: Secondary | ICD-10-CM | POA: Diagnosis not present

## 2017-07-25 DIAGNOSIS — I481 Persistent atrial fibrillation: Secondary | ICD-10-CM | POA: Insufficient documentation

## 2017-07-25 DIAGNOSIS — I493 Ventricular premature depolarization: Secondary | ICD-10-CM | POA: Diagnosis not present

## 2017-07-25 DIAGNOSIS — I480 Paroxysmal atrial fibrillation: Secondary | ICD-10-CM | POA: Insufficient documentation

## 2017-07-25 DIAGNOSIS — Z880 Allergy status to penicillin: Secondary | ICD-10-CM | POA: Insufficient documentation

## 2017-07-25 DIAGNOSIS — I4819 Other persistent atrial fibrillation: Secondary | ICD-10-CM

## 2017-07-25 DIAGNOSIS — I4510 Unspecified right bundle-branch block: Secondary | ICD-10-CM | POA: Diagnosis not present

## 2017-07-25 DIAGNOSIS — H02102 Unspecified ectropion of right lower eyelid: Secondary | ICD-10-CM | POA: Diagnosis not present

## 2017-07-25 DIAGNOSIS — I4891 Unspecified atrial fibrillation: Secondary | ICD-10-CM | POA: Diagnosis present

## 2017-07-25 NOTE — Progress Notes (Signed)
Primary Care Physician: [name], MD Referring Physician: [facility] f/u Cardiologist: [name] is a 84 y.o. female with a h/o CAD, CKD, PAF on Xarelto, HTN, and HLD since to the emergency department for evaluation of exertional dyspnea and generalized weakness worsening over the past several days. Patient stated that she has had intermittent atrial fibrillation and had her Xarelto discontinued by her cardiologist 1 week ago with concern for possible rash development. She was given rx for pradaxa but did not start 2/2 cost of drug. She denied chest pain, palpitations, shortness of breath.  No other modifying factors.  She restarted her Xarelto yesterday, she thought  that stopping this medication may have brought about her symptoms.  She continues to be compliant with her metoprolol and amlodipine.   She is in the afib clinic for f/u. She remains in afib at v rate of 107 bpm.  She was suppose to increase metoprolol to 50 mg bid, per ER instructions, but wanted to wait until today to clarify dose.  Shealso wants to try eliquis instead of using more expensive pradaxa, but explained to pt that both drugs are Factor X A inhibitors and she may experience rash with it as well, but she wants to try. Has had the rash x one year, shortly after starting xarelto. Feels well at rest but notices shortness of breath and fatigue with exertion.  F/u in the afib clinic, 2/4. She went to the local fire station yesterday, as she felt jittery on Sunday. A strip was done that showed afib at 88 bpm. EMS was called and they talked to pt and encouraged her to be seen in the office today instead of Wednesday. [name], she called to the office on Friday and was complaining that the BB was making her nose run and have a cough. She was changed to Cardizem 120 mg a day and BB was stopped. She is in afib with RVR today at 127 bpm, but states that she feels better with less runny nose and cough with stopping BB and  is having less itching with change to xarelto.Overall, she appears to be tolerating the afib well. She  will not be able to have cardioversion until after 2/13, as she did not restart anticoagulation until 1/23.  F/u in afib clinic, She is rate controlled afib with Cardizem 120 mg bid. She feels much better. We discussed cardioversion and she would like to proceed. She has been on uninterrupted anticoagulation since 1/22, she was switched to eliquis but did not miss any doses in the transition. She feels rash is improved on Eliquis.  F/u cardioversion, 2/21. She had successful cardioversion and continues in SR today. She feels improved.   Today, she denies symptoms of palpitations, chest pain, shortness of breath, orthopnea, PND, lower extremity edema, dizziness, presyncope, syncope, or neurologic sequela. The patient is tolerating medications without difficulties and is otherwise without complaint today.   Past Medical History:  Diagnosis Date  . Anticoagulant long-term use    xarelto  . CAD S/P percutaneous coronary angioplasty cardiologist-  dr Dorris Carnes (previous cardiologist- dr Lia Foyer)   hx MI 04/ 1994  s/p  PCI to LAD  . CKD (chronic kidney disease), stage III ([place])   . Complication of anesthesia    hard to wake   . History of basal cell carcinoma (BCC) excision    right eye area 08/ 2013  . History of kidney stones 01/20/2017  . History of  MI (myocardial infarction) 09/1992   s/p  PCI to LAD  . History of squamous cell carcinoma excision    right lower leg 2015 ;  2016  . Hyperlipidemia   . Hypertension   . Macular degeneration   . PAF (paroxysmal atrial fibrillation) ([place]) dx 02-24-2016   followed by cardiologist-- dr Mamie Nick. [name]  . PONV (postoperative nausea and vomiting)   . Right ureteral stone   . Wears glasses    Past Surgical History:  Procedure Laterality Date  . ABDOMINAL HYSTERECTOMY    . BREAST EXCISIONAL BIOPSY Left 11/02/2009   fibroadenoma (lumpectomy)  .  BROW LIFT Right 04/07/2014   Procedure: REPAIR OF ENTROPION AND TRICHIASIS OF RIGHT LOWER EYE LID ;  Surgeon: Theodoro Kos, DO;  Location: [place];  Service: Plastics;  Laterality: Right;  . CARDIOVERSION N/A 07/18/2017   Procedure: CARDIOVERSION;  Surgeon: Skeet Latch, MD;  Location: [place];  Service: Cardiovascular;  Laterality: N/A;  . CATARACT EXTRACTION W/ INTRAOCULAR LENS  IMPLANT, BILATERAL    . CORONARY ANGIOPLASTY  04/ 1994  dr Lia Foyer   PCI to LAD  . CYSTOSCOPY W/ URETERAL STENT PLACEMENT Right 01/20/2017   Procedure: CYSTOSCOPY WITH RIGHT RETROGRADE PYELOGRAM/RIGHT URETERAL STENT PLACEMENT;  Surgeon: Alexis Frock, MD;  Location: [facility];  Service: Urology;  Laterality: Right;  . CYSTOSCOPY WITH RETROGRADE PYELOGRAM, URETEROSCOPY AND STENT PLACEMENT Right 02/15/2017   Procedure: CYSTOSCOPY WITH RETROGRADE PYELOGRAM, URETEROSCOPY, STONE BASKETRY AND STENT REPLACEMENT;  Surgeon: Alexis Frock, MD;  Location: [facility];  Service: Urology;  Laterality: Right;  . CYSTOSCOPY/RETROGRADE/URETEROSCOPY/STONE EXTRACTION WITH BASKET  yrs ago  . DILATION AND CURETTAGE OF UTERUS    . HOLMIUM LASER APPLICATION Right 2/35/5732   Procedure: HOLMIUM LASER APPLICATION;  Surgeon: Alexis Frock, MD;  Location: [facility];  Service: Urology;  Laterality: Right;  . KNEE SURGERY     right  . NEUROPLASTY / TRANSPOSITION ULNAR NERVE AT ELBOW Left 02-23-2000    [facility]   and Left Carpal Tunnel Release  . TRANSTHORACIC ECHOCARDIOGRAM  02/26/2016   moderate focal basal and mild concentric LVH,  ef 55-60%,  akinesis of the basal-midanteroseptal, inferoseptal and apicalinferior myocardium, study not sufficient to evaluation diastolic funciton due to atrial fib./  trivial PR/  mild TR    Current Outpatient Medications  Medication Sig Dispense Refill  . acetaminophen (TYLENOL) 325 MG tablet Take 325 mg by mouth daily as needed for mild pain.     [name]  apixaban (ELIQUIS) 5 MG TABS tablet Take 1 tablet (5 mg total) by mouth 2 (two) times daily. 60 tablet 0  . diltiazem (CARDIZEM CD) 120 MG 24 hr capsule Take 1 capsule (120 mg total) by mouth 2 (two) times daily. 60 capsule 3  . Multiple Vitamins-Minerals (ICAPS AREDS 2) CAPS Take 1 capsule by mouth daily.    Shelly Obrien Glycol-Propyl Glycol (SYSTANE OP) Place 1 drop into both eyes daily as needed (itchy eyes).    . traMADol (ULTRAM) 50 MG tablet Take 1 tablet (50 mg total) by mouth every 6 (six) hours as needed for moderate pain or severe pain. Post-operatively. 15 tablet 0  . triamcinolone (NASACORT ALLERGY 24HR) 55 MCG/ACT AERO nasal inhaler Place 1-2 sprays into the nose 2 (two) times daily as needed (for rhinitis).      No current facility-administered medications for this encounter.     Allergies  Allergen Reactions  . Antihistamines, Chlorpheniramine-Type Other (See Comments)    Pt states that she  passed out.   [name] Penicillins Anaphylaxis and Other (See Comments)    Has patient had a PCN reaction causing immediate rash, facial/tongue/throat swelling, SOB or lightheadedness with hypotension: Yes Has patient had a PCN reaction causing severe rash involving mucus membranes or skin necrosis: No Has patient had a PCN reaction that required hospitalization No Has patient had a PCN reaction occurring within the last 10 years: No If all of the above answers are "NO", then may proceed with Cephalosporin use.  . Contrast Media [Iodinated Diagnostic Agents] Hives  . Sulfonamide Derivatives Other (See Comments)    Reaction:  Unknown   . Atorvastatin Other (See Comments)    Reaction:  Unknown   . Pheniramine Other (See Comments)    Pt states that she passed out.     Social History   Socioeconomic History  . Marital status: Widowed    Spouse name: Not on file  . Number of children: 2  . Years of education: Not on file  . Highest education level: Not on file  Social Needs  . Financial  resource strain: Not on file  . Food insecurity - worry: Not on file  . Food insecurity - inability: Not on file  . Transportation needs - medical: Not on file  . Transportation needs - non-medical: Not on file  Occupational History  . Occupation: Retired  Tobacco Use  . Smoking status: Never Smoker  . Smokeless tobacco: Never Used  Substance and Sexual Activity  . Alcohol use: No    Alcohol/week: 0.0 oz  . Drug use: No  . Sexual activity: Not on file  Other Topics Concern  . Not on file  Social History Narrative   Lives at home    Family History  Problem Relation Age of Onset  . Coronary artery disease Unknown        positive family hx of  . Cancer Unknown        family hx of  . Brain cancer Son   . Liver cancer Daughter     ROS- All systems are reviewed and negative except as per the HPI above  Physical Exam: Vitals:   07/25/17 1123  BP: 134/78  Pulse: 75  Weight: 165 lb 6.4 oz (75 kg)  Height: 5\' 5"  (1.651 m)   Wt Readings from Last 3 Encounters:  07/25/17 165 lb 6.4 oz (75 kg)  07/18/17 167 lb (75.8 kg)  07/12/17 167 lb (75.8 kg)    Labs: Lab Results  Component Value Date   NA 142 07/12/2017   K 4.3 07/12/2017   CL 110 07/12/2017   CO2 23 07/12/2017   GLUCOSE 112 (H) 07/12/2017   BUN 12 07/12/2017   CREATININE 1.03 (H) 07/12/2017   CALCIUM 9.5 07/12/2017   MG 2.1 06/26/2017   No results found for: INR No results found for: CHOL, HDL, LDLCALC, TRIG   GEN- The patient is well appearing, alert and oriented x 3 today.   Head- normocephalic, atraumatic Eyes-  Sclera clear, conjunctiva pink Ears- hearing intact Oropharynx- clear Neck- supple, no JVP Lymph- no cervical lymphadenopathy Lungs- Clear to ausculation bilaterally, normal work of breathing Heart-regular rate and rhythm, no murmurs, rubs or gallops, PMI not laterally displaced GI- soft, NT, ND, + BS Extremities- no clubbing, cyanosis, or edema MS- no significant deformity or  atrophy Skin- no rash or lesion Psych- euthymic mood, full affect Neuro- strength and sensation are intact  EKG- SR at 75 bpm, Pr int 178 ms, qrs int  102 ms, qtc 435 ms Epic records reviewed    Assessment and Plan: 1. Persistent  afib Successful cardioversion Maintaining SR Stopped BB 2/2 to runny nose and cough Continue on cardizem 120 mg a day bid  On eliquis 5 mg bid for chadsvasc score of at least 5, changed from xarelto for rash,  rash is improving  2. HTN Stable   F/u with Dr. [name] as scheduled in May  Shelly Obrien C. Shelly Obrien, [facility] [address] [place], [place] [address] [phone]

## 2017-07-30 ENCOUNTER — Other Ambulatory Visit (HOSPITAL_COMMUNITY): Payer: Self-pay | Admitting: Nurse Practitioner

## 2017-08-09 ENCOUNTER — Telehealth: Payer: Self-pay | Admitting: *Deleted

## 2017-08-09 NOTE — Telephone Encounter (Signed)
   [facility] Pre-operative Risk Assessment    Request for surgical clearance:  1. What type of surgery is being performed? Cervical sympathetic blcok  2. When is this surgery scheduled? 08/27/17  3. What type of clearance is required (medical clearance vs. Pharmacy clearance to hold med vs. Both)? Both   4. Are there any medications that need to be held prior to surgery and how long? Eliqusi 3 days prior of injection  5. Practice name and name of physician performing surgery? [facility]  6. What is your office phone and fax number? Lorane Gell, M#[phone] ext. [phone], [phone]  7. Anesthesia type (None, local, MAC, general) ? Not Stated    Marlis Edelson 08/09/2017, 5:43 PM  _________________________________________________________________   (provider comments below)

## 2017-08-12 DIAGNOSIS — R351 Nocturia: Secondary | ICD-10-CM | POA: Diagnosis not present

## 2017-08-13 NOTE — Telephone Encounter (Signed)
Shelly Obrien, on [name], I see she had DCCV on 2/21/9 she [place] scheduled for cervical sympathetic block on 3.26/19, and need to be off Eliquis 3 days prior -- should we ask her to  hold off longer for procedure - ?  Thanks.

## 2017-08-13 NOTE — Telephone Encounter (Signed)
Left message for pt to call back to make sure stable.

## 2017-08-14 ENCOUNTER — Telehealth: Payer: Self-pay | Admitting: Internal Medicine

## 2017-08-14 NOTE — Telephone Encounter (Signed)
NEW MESSAGE    Pre op callback. Patient returning call

## 2017-08-15 NOTE — Telephone Encounter (Signed)
F/U Call:  Patient states that she is returning call

## 2017-08-16 NOTE — Telephone Encounter (Signed)
See phone note 08/09/17, original clearance faxed over to [facility].

## 2017-08-16 NOTE — Telephone Encounter (Signed)
   Primary Cardiologist: Dorris Carnes, MD  Chart reviewed as part of pre-operative protocol coverage. Given past medical history and time since last visit, based on ACC/AHA guidelines, Shelly Obrien would be at acceptable risk for the planned procedure without further cardiovascular testing. She has had no awareness of afib since her cardioversion on 07/18/17. No chest discomfort or shortness of breath.   She is concerned that the injection may lead to recurrence of afib as she developed afib a day after a previous injection. We discussed that this cannot be predicted and there is a chance that she could develop afib. We discussed risk vs benefit of the injection and she feels that she really needs to have the injection. She understands that afib is not life threatening and can be treated. She is advised to call the afib clinic if she develops symptoms after the injection or go to ED if significant symptoms.   See attached note regarding OK to hold Eliquis for 3 days prior to procedure and resume when safe.   I will route this recommendation to the requesting party via Epic fax function and remove from pre-op pool.  Please call with questions.  Daune Perch, NP 08/16/2017, 2:03 PM

## 2017-08-27 DIAGNOSIS — G90512 Complex regional pain syndrome I of left upper limb: Secondary | ICD-10-CM | POA: Diagnosis not present

## 2017-09-02 DIAGNOSIS — R351 Nocturia: Secondary | ICD-10-CM | POA: Diagnosis not present

## 2017-09-02 DIAGNOSIS — N202 Calculus of kidney with calculus of ureter: Secondary | ICD-10-CM | POA: Diagnosis not present

## 2017-09-06 DIAGNOSIS — I480 Paroxysmal atrial fibrillation: Secondary | ICD-10-CM | POA: Diagnosis not present

## 2017-09-06 DIAGNOSIS — Z1389 Encounter for screening for other disorder: Secondary | ICD-10-CM | POA: Diagnosis not present

## 2017-09-06 DIAGNOSIS — I2510 Atherosclerotic heart disease of native coronary artery without angina pectoris: Secondary | ICD-10-CM | POA: Diagnosis not present

## 2017-09-06 DIAGNOSIS — Z0000 Encounter for general adult medical examination without abnormal findings: Secondary | ICD-10-CM | POA: Diagnosis not present

## 2017-09-06 DIAGNOSIS — I10 Essential (primary) hypertension: Secondary | ICD-10-CM | POA: Diagnosis not present

## 2017-09-12 DIAGNOSIS — H353221 Exudative age-related macular degeneration, left eye, with active choroidal neovascularization: Secondary | ICD-10-CM | POA: Diagnosis not present

## 2017-09-12 DIAGNOSIS — H43812 Vitreous degeneration, left eye: Secondary | ICD-10-CM | POA: Diagnosis not present

## 2017-09-12 DIAGNOSIS — H353211 Exudative age-related macular degeneration, right eye, with active choroidal neovascularization: Secondary | ICD-10-CM | POA: Diagnosis not present

## 2017-10-02 ENCOUNTER — Other Ambulatory Visit (HOSPITAL_COMMUNITY): Payer: Self-pay | Admitting: Nurse Practitioner

## 2017-10-03 DIAGNOSIS — H353211 Exudative age-related macular degeneration, right eye, with active choroidal neovascularization: Secondary | ICD-10-CM | POA: Diagnosis not present

## 2017-10-31 ENCOUNTER — Other Ambulatory Visit (HOSPITAL_COMMUNITY): Payer: Self-pay | Admitting: Nurse Practitioner

## 2017-10-31 DIAGNOSIS — H353211 Exudative age-related macular degeneration, right eye, with active choroidal neovascularization: Secondary | ICD-10-CM | POA: Diagnosis not present

## 2017-10-31 DIAGNOSIS — H353221 Exudative age-related macular degeneration, left eye, with active choroidal neovascularization: Secondary | ICD-10-CM | POA: Diagnosis not present

## 2017-11-25 DIAGNOSIS — R269 Unspecified abnormalities of gait and mobility: Secondary | ICD-10-CM | POA: Diagnosis not present

## 2017-11-26 DIAGNOSIS — L218 Other seborrheic dermatitis: Secondary | ICD-10-CM | POA: Diagnosis not present

## 2017-11-26 DIAGNOSIS — L309 Dermatitis, unspecified: Secondary | ICD-10-CM | POA: Diagnosis not present

## 2017-11-26 DIAGNOSIS — L821 Other seborrheic keratosis: Secondary | ICD-10-CM | POA: Diagnosis not present

## 2017-11-27 ENCOUNTER — Other Ambulatory Visit (HOSPITAL_COMMUNITY): Payer: Self-pay | Admitting: Nurse Practitioner

## 2017-11-30 ENCOUNTER — Encounter (HOSPITAL_COMMUNITY): Payer: Self-pay | Admitting: Emergency Medicine

## 2017-11-30 ENCOUNTER — Emergency Department (HOSPITAL_COMMUNITY): Payer: Medicare Other

## 2017-11-30 ENCOUNTER — Emergency Department (HOSPITAL_COMMUNITY)
Admission: EM | Admit: 2017-11-30 | Discharge: 2017-11-30 | Disposition: A | Discharge status: Home / Self Care | Payer: Medicare Other | Attending: Emergency Medicine | Admitting: Emergency Medicine

## 2017-11-30 ENCOUNTER — Other Ambulatory Visit: Payer: Self-pay

## 2017-11-30 DIAGNOSIS — K8020 Calculus of gallbladder without cholecystitis without obstruction: Secondary | ICD-10-CM | POA: Diagnosis not present

## 2017-11-30 DIAGNOSIS — N183 Chronic kidney disease, stage 3 (moderate): Secondary | ICD-10-CM | POA: Diagnosis not present

## 2017-11-30 DIAGNOSIS — Z85828 Personal history of other malignant neoplasm of skin: Secondary | ICD-10-CM | POA: Insufficient documentation

## 2017-11-30 DIAGNOSIS — Z79899 Other long term (current) drug therapy: Secondary | ICD-10-CM | POA: Diagnosis not present

## 2017-11-30 DIAGNOSIS — I129 Hypertensive chronic kidney disease with stage 1 through stage 4 chronic kidney disease, or unspecified chronic kidney disease: Secondary | ICD-10-CM | POA: Diagnosis not present

## 2017-11-30 DIAGNOSIS — I2510 Atherosclerotic heart disease of native coronary artery without angina pectoris: Secondary | ICD-10-CM | POA: Insufficient documentation

## 2017-11-30 DIAGNOSIS — Z7901 Long term (current) use of anticoagulants: Secondary | ICD-10-CM | POA: Diagnosis not present

## 2017-11-30 DIAGNOSIS — K219 Gastro-esophageal reflux disease without esophagitis: Secondary | ICD-10-CM | POA: Diagnosis not present

## 2017-11-30 DIAGNOSIS — N200 Calculus of kidney: Secondary | ICD-10-CM | POA: Diagnosis not present

## 2017-11-30 DIAGNOSIS — J849 Interstitial pulmonary disease, unspecified: Secondary | ICD-10-CM | POA: Diagnosis not present

## 2017-11-30 DIAGNOSIS — I10 Essential (primary) hypertension: Secondary | ICD-10-CM | POA: Diagnosis not present

## 2017-11-30 DIAGNOSIS — R1012 Left upper quadrant pain: Secondary | ICD-10-CM

## 2017-11-30 LAB — URINALYSIS, ROUTINE W REFLEX MICROSCOPIC
Bilirubin Urine: NEGATIVE
Glucose, UA: NEGATIVE mg/dL
Hgb urine dipstick: NEGATIVE
Ketones, ur: NEGATIVE mg/dL
Leukocytes, UA: NEGATIVE
Nitrite: NEGATIVE
Protein, ur: NEGATIVE mg/dL
Specific Gravity, Urine: 1.011 (ref 1.005–1.030)
pH: 6 (ref 5.0–8.0)

## 2017-11-30 LAB — CBC
HCT: 40.5 % (ref 36.0–46.0)
Hemoglobin: 12.1 g/dL (ref 12.0–15.0)
MCH: 26.6 pg (ref 26.0–34.0)
MCHC: 29.9 g/dL — ABNORMAL LOW (ref 30.0–36.0)
MCV: 89 fL (ref 78.0–100.0)
Platelets: 218 10*3/uL (ref 150–400)
RBC: 4.55 MIL/uL (ref 3.87–5.11)
RDW: 17.5 % — ABNORMAL HIGH (ref 11.5–15.5)
WBC: 5.7 10*3/uL (ref 4.0–10.5)

## 2017-11-30 LAB — BASIC METABOLIC PANEL
Anion gap: 13 (ref 5–15)
BUN: 16 mg/dL (ref 8–23)
CO2: 22 mmol/L (ref 22–32)
Calcium: 9.4 mg/dL (ref 8.9–10.3)
Chloride: 106 mmol/L (ref 98–111)
Creatinine, Ser: 1 mg/dL (ref 0.44–1.00)
GFR calc Af Amer: 55 mL/min — ABNORMAL LOW (ref >60)
GFR calc non Af Amer: 47 mL/min — ABNORMAL LOW (ref >60)
Glucose, Bld: 151 mg/dL — ABNORMAL HIGH (ref 70–99)
Potassium: 3.9 mmol/L (ref 3.5–5.1)
Sodium: 141 mmol/L (ref 135–145)

## 2017-11-30 LAB — HEPATIC FUNCTION PANEL
ALT: 13 U/L (ref 0–44)
AST: 23 U/L (ref 15–41)
Albumin: 3.5 g/dL (ref 3.5–5.0)
Alkaline Phosphatase: 127 U/L — ABNORMAL HIGH (ref 38–126)
Bilirubin, Direct: 0.1 mg/dL (ref 0.0–0.2)
Indirect Bilirubin: 0.7 mg/dL (ref 0.3–0.9)
Total Bilirubin: 0.8 mg/dL (ref 0.3–1.2)
Total Protein: 6.5 g/dL (ref 6.5–8.1)

## 2017-11-30 LAB — I-STAT TROPONIN, ED: Troponin i, poc: 0 ng/mL (ref 0.00–0.08)

## 2017-11-30 LAB — LIPASE, BLOOD: Lipase: 30 U/L (ref 11–51)

## 2017-11-30 MED ORDER — MORPHINE SULFATE (PF) 4 MG/ML IV SOLN
4.0000 mg | Freq: Once | INTRAVENOUS | Status: AC
  Administered 2017-11-30: 4 mg via INTRAVENOUS
  Filled 2017-11-30: qty 1

## 2017-11-30 MED ORDER — PANTOPRAZOLE SODIUM 20 MG PO TBEC: 20.0000 mg | DELAYED_RELEASE_TABLET | Freq: Every day | ORAL | 1 refills | Status: DC

## 2017-11-30 MED ORDER — FAMOTIDINE IN NACL 20-0.9 MG/50ML-% IV SOLN
20.0000 mg | Freq: Once | INTRAVENOUS | Status: AC
  Administered 2017-11-30: 20 mg via INTRAVENOUS
  Filled 2017-11-30: qty 50

## 2017-11-30 MED ORDER — BARIUM SULFATE 2.1 % PO SUSP
ORAL | Status: AC
  Filled 2017-11-30: qty 2

## 2017-11-30 NOTE — ED Provider Notes (Signed)
[facility] EMERGENCY DEPARTMENT Provider Note   CSN: [id number] Arrival date & time: 11/30/17  0830     History   Chief Complaint Chief Complaint  Patient presents with  . Abdominal Pain  . Back Pain    HPI Shelly Obrien is a 84 y.o. female.  HPI Patient presented to the emergency room for evaluation of pain in her upper abdomen that started a few days ago.  Patient states she is had some constant discomfort in her upper abdomen.  It seems to be worse at night it makes it hard for her to sleep.  Occasionally when she burps she will get some relief but the symptoms return.  She had some nausea but no vomiting.  She denies any trouble with diarrhea.  She feels like she might have some mild constipation but nothing severe.  She denies any chest pain or shortness of breath.  No dysuria.  Patient states she does have a history of gallstones.  She is never had surgery for that.  She also has history of kidney stones that required stenting. Past Medical History:  Diagnosis Date  . Anticoagulant long-term use    xarelto  . CAD S/P percutaneous coronary angioplasty cardiologist-  dr Dorris Carnes (previous cardiologist- dr Lia Foyer)   hx MI 04/ 1994  s/p  PCI to LAD  . CKD (chronic kidney disease), stage III ([place])   . Complication of anesthesia    hard to wake   . History of basal cell carcinoma (BCC) excision    right eye area 08/ 2013  . History of kidney stones 01/20/2017  . History of MI (myocardial infarction) 09/1992   s/p  PCI to LAD  . History of squamous cell carcinoma excision    right lower leg 2015 ;  2016  . Hyperlipidemia   . Hypertension   . Macular degeneration   . PAF (paroxysmal atrial fibrillation) ([place]) dx 02-24-2016   followed by cardiologist-- dr Mamie Nick. [name]  . PONV (postoperative nausea and vomiting)   . Right ureteral stone   . Wears glasses     Patient Active Problem List   Diagnosis Date Noted  . Spinal stenosis in cervical region  04/24/2017  . Spinal stenosis of lumbar region 04/24/2017  . Dizziness 04/24/2017  . Ureteral stone with hydronephrosis 01/20/2017  . Atrial fibrillation with RVR ([place]) 02/24/2016  . CKD (chronic kidney disease) stage 3, GFR 30-59 ml/min ([facility]) 02/24/2016  . Atrial fibrillation ([place]) 02/24/2016  . Exudative macular degeneration ([place]) 02/24/2016  . CHOLELITHIASIS 02/14/2009  . HYPERLIPIDEMIA 12/29/2008  . Essential hypertension 12/29/2008  . MYOCARDIAL INFARCTION, HX OF 12/29/2008  . CAD (coronary artery disease) 12/29/2008    Past Surgical History:  Procedure Laterality Date  . ABDOMINAL HYSTERECTOMY    . BREAST EXCISIONAL BIOPSY Left 11/02/2009   fibroadenoma (lumpectomy)  . BROW LIFT Right 04/07/2014   Procedure: REPAIR OF ENTROPION AND TRICHIASIS OF RIGHT LOWER EYE LID ;  Surgeon: Theodoro Kos, DO;  Location: [facility];  Service: Plastics;  Laterality: Right;  . CARDIOVERSION N/A 07/18/2017   Procedure: CARDIOVERSION;  Surgeon: Skeet Latch, MD;  Location: [facility];  Service: Cardiovascular;  Laterality: N/A;  . CATARACT EXTRACTION W/ INTRAOCULAR LENS  IMPLANT, BILATERAL    . CORONARY ANGIOPLASTY  04/ 1994  dr Lia Foyer   PCI to LAD  . CYSTOSCOPY W/ URETERAL STENT PLACEMENT Right 01/20/2017   Procedure: CYSTOSCOPY WITH RIGHT RETROGRADE PYELOGRAM/RIGHT URETERAL STENT PLACEMENT;  Surgeon: Alexis Frock,  MD;  Location: [facility];  Service: Urology;  Laterality: Right;  . CYSTOSCOPY WITH RETROGRADE PYELOGRAM, URETEROSCOPY AND STENT PLACEMENT Right 02/15/2017   Procedure: CYSTOSCOPY WITH RETROGRADE PYELOGRAM, URETEROSCOPY, STONE BASKETRY AND STENT REPLACEMENT;  Surgeon: Alexis Frock, MD;  Location: [facility];  Service: Urology;  Laterality: Right;  . CYSTOSCOPY/RETROGRADE/URETEROSCOPY/STONE EXTRACTION WITH BASKET  yrs ago  . DILATION AND CURETTAGE OF UTERUS    . HOLMIUM LASER APPLICATION Right 12/14/4578   Procedure: HOLMIUM LASER APPLICATION;   Surgeon: Alexis Frock, MD;  Location: [facility];  Service: Urology;  Laterality: Right;  . KNEE SURGERY     right  . NEUROPLASTY / TRANSPOSITION ULNAR NERVE AT ELBOW Left 02-23-2000    [facility]   and Left Carpal Tunnel Release  . TRANSTHORACIC ECHOCARDIOGRAM  02/26/2016   moderate focal basal and mild concentric LVH,  ef 55-60%,  akinesis of the basal-midanteroseptal, inferoseptal and apicalinferior myocardium, study not sufficient to evaluation diastolic funciton due to atrial fib./  trivial PR/  mild TR     OB History   None      Home Medications    Prior to Admission medications   Medication Sig Start Date End Date Taking? Authorizing Provider  diltiazem (CARDIZEM CD) 120 MG 24 hr capsule TAKE ONE CAPSULE BY MOUTH TWICE DAILY 10/02/17  Yes Sherran Needs, NP  ELIQUIS 5 MG TABS tablet TAKE 1 TABLET BY MOUTH TWICE DAILY 11/27/17  Yes Sherran Needs, NP  Multiple Vitamins-Minerals (ICAPS AREDS 2) CAPS Take 2 capsules by mouth daily.    Yes [provider]  Polyethyl Glycol-Propyl Glycol (SYSTANE OP) Place 1 drop into both eyes daily as needed (itchy eyes).   Yes [provider]  triamcinolone (KENALOG) 0.025 % cream Apply 1 application topically daily. For rash 11/26/17  Yes [provider]  triamcinolone (NASACORT ALLERGY 24HR) 55 MCG/ACT AERO nasal inhaler Place 1-2 sprays into the nose 2 (two) times daily as needed (for rhinitis).    Yes [provider]  acetaminophen (TYLENOL) 325 MG tablet Take 325 mg by mouth daily as needed for mild pain.     [provider]  pantoprazole (PROTONIX) 20 MG tablet Take 1 tablet (20 mg total) by mouth daily. 11/30/17   Dorie Rank, MD  traMADol (ULTRAM) 50 MG tablet Take 1 tablet (50 mg total) by mouth every 6 (six) hours as needed for moderate pain or severe pain. Post-operatively. 02/15/17   Alexis Frock, MD    Family History Family History  Problem Relation Age of Onset  . Coronary  artery disease Unknown        positive family hx of  . Cancer Unknown        family hx of  . Brain cancer Son   . Liver cancer Daughter     Social History Social History   Tobacco Use  . Smoking status: Never Smoker  . Smokeless tobacco: Never Used  Substance Use Topics  . Alcohol use: No    Alcohol/week: 0.0 oz  . Drug use: No     Allergies   Antihistamines, chlorpheniramine-type; Penicillins; Contrast media [iodinated diagnostic agents]; Sulfonamide derivatives; Atorvastatin; and Pheniramine   Review of Systems Review of Systems  All other systems reviewed and are negative.    Physical Exam Updated Vital Signs BP (!) 146/82   Pulse 64   Temp 97.9 F (36.6 C) (Oral)   Resp 18   SpO2 96%   Physical Exam  Constitutional:  Non-toxic  appearance. She does not appear ill. No distress.  HENT:  Head: Normocephalic and atraumatic.  Right Ear: External ear normal.  Left Ear: External ear normal.  Eyes: Conjunctivae are normal. Right eye exhibits no discharge. Left eye exhibits no discharge. No scleral icterus.  Neck: Neck supple. No tracheal deviation present.  Cardiovascular: Normal rate, regular rhythm and intact distal pulses.  Pulmonary/Chest: Effort normal and breath sounds normal. No stridor. No respiratory distress. She has no wheezes. She has no rales.  Abdominal: Soft. Bowel sounds are normal. She exhibits no distension. There is tenderness in the right upper quadrant, epigastric area and left upper quadrant. There is no rebound and no guarding. No hernia.  Musculoskeletal: She exhibits no edema or tenderness.  Neurological: She is alert. She has normal strength. No cranial nerve deficit (no facial droop, extraocular movements intact, no slurred speech) or sensory deficit. She exhibits normal muscle tone. She displays no seizure activity. Coordination normal.  Skin: Skin is warm and dry. No rash noted.  Psychiatric: She has a normal mood and affect.  Nursing  note and vitals reviewed.    ED Treatments / Results  Labs (all labs ordered are listed, but only abnormal results are displayed) Labs Reviewed  BASIC METABOLIC PANEL - Abnormal; Notable for the following components:      Result Value   Glucose, Bld 151 (*)    GFR calc non Af Amer 47 (*)    GFR calc Af Amer 55 (*)    All other components within normal limits  CBC - Abnormal; Notable for the following components:   MCHC 29.9 (*)    RDW 17.5 (*)    All other components within normal limits  HEPATIC FUNCTION PANEL - Abnormal; Notable for the following components:   Alkaline Phosphatase 127 (*)    All other components within normal limits  LIPASE, BLOOD  URINALYSIS, ROUTINE W REFLEX MICROSCOPIC  I-STAT TROPONIN, ED    EKG EKG Interpretation  Date/Time:  Saturday November 30 2017 08:40:27 EDT Ventricular Rate:  74 PR Interval:  188 QRS Duration: 110 QT Interval:  408 QTC Calculation: 452 R Axis:   -67 Text Interpretation:  Sinus rhythm with occasional Premature ventricular complexes Left axis deviation Pulmonary disease pattern Minimal voltage criteria for LVH, may be normal variant Abnormal ECG No significant change since last tracing Confirmed by Dorie Rank [phone]) on 11/30/2017 10:48:01 AM   Radiology Ct Abdomen Pelvis Wo Contrast  Result Date: 11/30/2017 CLINICAL DATA:  Chest and back pain over the past 3 days. EXAM: CT ABDOMEN AND PELVIS WITHOUT CONTRAST TECHNIQUE: Multidetector CT imaging of the abdomen and pelvis was performed following the standard protocol without IV contrast. COMPARISON:  01/20/2017 FINDINGS: Lower chest: Scarring in the right lower lobe and anteriorly in the left lower lobe. Coronary atherosclerosis. Aortic valve calcification. Mild cardiomegaly. There is some contrast medium in the esophagus suggesting reflux or dysmotility. Hepatobiliary: Numerous gallstones measuring up to about 1 cm in diameter. Pancreas: Possible cystic lesion inferiorly along the head  of the pancreas, about 1.6 by 1.3 cm on image 43/3 which appears stable back through 12/07/2010. Spleen: Unremarkable Adrenals/Urinary Tract: Adrenal glands normal. There are about 3 nonobstructive right renal calculi each in the 1-2 mm diameter range. There are 2 punctate calcifications in the left kidney lower pole on image 94/6 which are probably tiny nonobstructive renal calculi, and less likely to be vascular. Suspected left peripelvic cysts. No obstructive stone identified. Urinary bladder unremarkable. Stomach/Bowel: Unremarkable.  Normal appendix.  Vascular/Lymphatic: Aortoiliac atherosclerotic vascular disease. No pathologic adenopathy. Reproductive: Uterus absent.  Adnexa unremarkable. Other: No supplemental non-categorized findings. Musculoskeletal: Thoracic and lumbar spondylosis and degenerative disc disease with suspected right foraminal impingement at T12-L1 and L1-2, and left foraminal impingement at L1-2, L2-3, and L3-4. Grade 1 degenerative anterolisthesis at L4-5. IMPRESSION: 1. Bilateral nonobstructive renal calculi. 2. A cause for the patient's chest and back pain is not identified. 3. Lower thoracic and lumbar spondylosis and degenerative disc disease. 4. Cholelithiasis. 5. Small fluid density lesion along the head of the pancreas is not appreciably changed over the past 5 years, and likely benign. 6.  Aortic Atherosclerosis (ICD10-I70.0). Electronically Signed   By: Van Clines M.D.   On: 11/30/2017 16:24   Dg Chest 2 View  Result Date: 11/30/2017 CLINICAL DATA:  Pain beneath both breasts radiating to the back bilaterally for the past 3 days. Some nausea. EXAM: CHEST - 2 VIEW COMPARISON:  06/26/2017. FINDINGS: The cardiac silhouette remains borderline enlarged and the aorta remains tortuous. The pulmonary vasculature and interstitial markings remain mildly prominent. No significant change in linear scarring at both lung bases. Thoracic spine degenerative changes and diffuse  osteopenia. IMPRESSION: 1. No acute abnormality. 2. Stable borderline cardiomegaly, mild pulmonary vascular congestion and mild chronic interstitial lung disease. Electronically Signed   By: Claudie Revering M.D.   On: 11/30/2017 09:13    Procedures Procedures (including critical care time)  Medications Ordered in ED Medications  Barium Sulfate 2.1 % SUSP (has no administration in time range)  morphine 4 MG/ML injection 4 mg (4 mg Intravenous Given 11/30/17 1142)  famotidine (PEPCID) IVPB 20 mg premix (0 mg Intravenous Stopped 11/30/17 1236)     Initial Impression / Assessment and Plan / ED Course  I have reviewed the triage vital signs and the nursing notes.  Pertinent labs & imaging results that were available during my care of the patient were reviewed by me and considered in my medical decision making (see chart for details).  Clinical Course as of Nov 30 1632  Sat Nov 30, 2017  1557 Labs are reassuring.     [OJ]  5009 Labs are reasssuring.  No leukocytosis.  LFTs are normal.  Pt's sx improved with antacid and pain meds.  No vomiting in the ED.  CT scan pedning   [JK]    Clinical Course User Index [JK] Dorie Rank, MD    She presented to the emergency room with complaints of upper abdominal pain.  ED work-up is reassuring.  Doubt cardiac etiology.  No signs of obstruction.  CT scan does not show any acute abnormalities.  Patient does have gallstones but no findings suggest cholecystitis and her symptoms do not sound like biliary colic.  I suspect she is having acid reflux.  Will discharge home on a course of antacids.  Follow-up with her primary care doctor.  Final Clinical Impressions(s) / ED Diagnoses   Final diagnoses:  Left upper quadrant pain  Gallstones  Gastroesophageal reflux disease, esophagitis presence not specified    ED Discharge Orders        Ordered    pantoprazole (PROTONIX) 20 MG tablet  Daily     11/30/17 1633       Dorie Rank, MD 11/30/17 [phone]

## 2017-11-30 NOTE — Discharge Instructions (Addendum)
Continue your current medications, start taking the Protonix to help with acid reflux, follow-up with your primary care doctor next week to make sure your symptoms are improving

## 2017-11-30 NOTE — ED Notes (Signed)
Patient transported to CT 

## 2017-11-30 NOTE — ED Triage Notes (Signed)
Pt reports pain under her breasts that radiates around to her back bilaterally x3 days, denies any associated symptoms. resp e/u, nad.

## 2017-12-04 ENCOUNTER — Other Ambulatory Visit: Payer: Self-pay

## 2017-12-04 ENCOUNTER — Emergency Department (HOSPITAL_COMMUNITY): Payer: Medicare Other

## 2017-12-04 ENCOUNTER — Inpatient Hospital Stay (HOSPITAL_COMMUNITY)
Admission: EM | Admit: 2017-12-04 | Discharge: 2017-12-11 | DRG: 418 | Disposition: A | Discharge status: Home / Self Care | Payer: Medicare Other | Attending: Internal Medicine | Admitting: Internal Medicine

## 2017-12-04 ENCOUNTER — Encounter (HOSPITAL_COMMUNITY): Payer: Self-pay | Admitting: Emergency Medicine

## 2017-12-04 DIAGNOSIS — I2510 Atherosclerotic heart disease of native coronary artery without angina pectoris: Secondary | ICD-10-CM | POA: Diagnosis present

## 2017-12-04 DIAGNOSIS — K8010 Calculus of gallbladder with chronic cholecystitis without obstruction: Secondary | ICD-10-CM | POA: Diagnosis not present

## 2017-12-04 DIAGNOSIS — Z8679 Personal history of other diseases of the circulatory system: Secondary | ICD-10-CM

## 2017-12-04 DIAGNOSIS — K5900 Constipation, unspecified: Secondary | ICD-10-CM | POA: Diagnosis not present

## 2017-12-04 DIAGNOSIS — Z79899 Other long term (current) drug therapy: Secondary | ICD-10-CM | POA: Diagnosis not present

## 2017-12-04 DIAGNOSIS — N183 Chronic kidney disease, stage 3 unspecified: Secondary | ICD-10-CM | POA: Diagnosis present

## 2017-12-04 DIAGNOSIS — N200 Calculus of kidney: Secondary | ICD-10-CM | POA: Diagnosis present

## 2017-12-04 DIAGNOSIS — I129 Hypertensive chronic kidney disease with stage 1 through stage 4 chronic kidney disease, or unspecified chronic kidney disease: Secondary | ICD-10-CM | POA: Diagnosis present

## 2017-12-04 DIAGNOSIS — R109 Unspecified abdominal pain: Secondary | ICD-10-CM | POA: Diagnosis present

## 2017-12-04 DIAGNOSIS — K810 Acute cholecystitis: Secondary | ICD-10-CM | POA: Diagnosis not present

## 2017-12-04 DIAGNOSIS — I481 Persistent atrial fibrillation: Secondary | ICD-10-CM | POA: Diagnosis present

## 2017-12-04 DIAGNOSIS — I480 Paroxysmal atrial fibrillation: Secondary | ICD-10-CM | POA: Diagnosis not present

## 2017-12-04 DIAGNOSIS — R52 Pain, unspecified: Secondary | ICD-10-CM | POA: Diagnosis not present

## 2017-12-04 DIAGNOSIS — R1011 Right upper quadrant pain: Secondary | ICD-10-CM | POA: Diagnosis not present

## 2017-12-04 DIAGNOSIS — Z7901 Long term (current) use of anticoagulants: Secondary | ICD-10-CM

## 2017-12-04 DIAGNOSIS — K429 Umbilical hernia without obstruction or gangrene: Secondary | ICD-10-CM | POA: Diagnosis present

## 2017-12-04 DIAGNOSIS — K812 Acute cholecystitis with chronic cholecystitis: Secondary | ICD-10-CM | POA: Diagnosis not present

## 2017-12-04 DIAGNOSIS — K8020 Calculus of gallbladder without cholecystitis without obstruction: Secondary | ICD-10-CM | POA: Diagnosis not present

## 2017-12-04 DIAGNOSIS — Z955 Presence of coronary angioplasty implant and graft: Secondary | ICD-10-CM

## 2017-12-04 DIAGNOSIS — R1012 Left upper quadrant pain: Secondary | ICD-10-CM | POA: Diagnosis not present

## 2017-12-04 DIAGNOSIS — Z85828 Personal history of other malignant neoplasm of skin: Secondary | ICD-10-CM | POA: Diagnosis not present

## 2017-12-04 DIAGNOSIS — E782 Mixed hyperlipidemia: Secondary | ICD-10-CM | POA: Diagnosis present

## 2017-12-04 DIAGNOSIS — K862 Cyst of pancreas: Secondary | ICD-10-CM | POA: Diagnosis present

## 2017-12-04 DIAGNOSIS — H3530 Unspecified macular degeneration: Secondary | ICD-10-CM | POA: Diagnosis present

## 2017-12-04 DIAGNOSIS — N281 Cyst of kidney, acquired: Secondary | ICD-10-CM | POA: Diagnosis not present

## 2017-12-04 DIAGNOSIS — D62 Acute posthemorrhagic anemia: Secondary | ICD-10-CM | POA: Diagnosis not present

## 2017-12-04 DIAGNOSIS — Z90710 Acquired absence of both cervix and uterus: Secondary | ICD-10-CM | POA: Diagnosis not present

## 2017-12-04 DIAGNOSIS — K8012 Calculus of gallbladder with acute and chronic cholecystitis without obstruction: Principal | ICD-10-CM | POA: Diagnosis present

## 2017-12-04 DIAGNOSIS — I252 Old myocardial infarction: Secondary | ICD-10-CM

## 2017-12-04 DIAGNOSIS — I10 Essential (primary) hypertension: Secondary | ICD-10-CM | POA: Diagnosis not present

## 2017-12-04 DIAGNOSIS — Z91041 Radiographic dye allergy status: Secondary | ICD-10-CM

## 2017-12-04 DIAGNOSIS — Z01810 Encounter for preprocedural cardiovascular examination: Secondary | ICD-10-CM | POA: Diagnosis not present

## 2017-12-04 LAB — CBC WITH DIFFERENTIAL/PLATELET
Basophils Absolute: 0 10*3/uL (ref 0.0–0.1)
Basophils Relative: 0 %
Eosinophils Absolute: 0.2 10*3/uL (ref 0.0–0.7)
Eosinophils Relative: 3 %
HCT: 39.7 % (ref 36.0–46.0)
Hemoglobin: 12.3 g/dL (ref 12.0–15.0)
Lymphocytes Relative: 29 %
Lymphs Abs: 1.6 10*3/uL (ref 0.7–4.0)
MCH: 27.3 pg (ref 26.0–34.0)
MCHC: 31 g/dL (ref 30.0–36.0)
MCV: 88.2 fL (ref 78.0–100.0)
Monocytes Absolute: 0.6 10*3/uL (ref 0.1–1.0)
Monocytes Relative: 11 %
Neutro Abs: 3.2 10*3/uL (ref 1.7–7.7)
Neutrophils Relative %: 57 %
Platelets: 208 10*3/uL (ref 150–400)
RBC: 4.5 MIL/uL (ref 3.87–5.11)
RDW: 17.6 % — ABNORMAL HIGH (ref 11.5–15.5)
WBC: 5.7 10*3/uL (ref 4.0–10.5)

## 2017-12-04 LAB — COMPREHENSIVE METABOLIC PANEL
ALT: 15 U/L (ref 0–44)
AST: 25 U/L (ref 15–41)
Albumin: 3.9 g/dL (ref 3.5–5.0)
Alkaline Phosphatase: 135 U/L — ABNORMAL HIGH (ref 38–126)
Anion gap: 9 (ref 5–15)
BUN: 16 mg/dL (ref 8–23)
CO2: 26 mmol/L (ref 22–32)
Calcium: 9.5 mg/dL (ref 8.9–10.3)
Chloride: 107 mmol/L (ref 98–111)
Creatinine, Ser: 0.89 mg/dL (ref 0.44–1.00)
GFR calc Af Amer: >60 mL/min (ref >60)
GFR calc non Af Amer: 54 mL/min — ABNORMAL LOW (ref >60)
Glucose, Bld: 116 mg/dL — ABNORMAL HIGH (ref 70–99)
Potassium: 4.1 mmol/L (ref 3.5–5.1)
Sodium: 142 mmol/L (ref 135–145)
Total Bilirubin: 0.6 mg/dL (ref 0.3–1.2)
Total Protein: 7 g/dL (ref 6.5–8.1)

## 2017-12-04 LAB — LIPASE, BLOOD: Lipase: 28 U/L (ref 11–51)

## 2017-12-04 LAB — PROTIME-INR
INR: 1.26
INR: 1.15
Prothrombin Time: 15.7 seconds — ABNORMAL HIGH (ref 11.4–15.2)
Prothrombin Time: 14.6 seconds (ref 11.4–15.2)

## 2017-12-04 LAB — APTT
aPTT: 31 seconds (ref 24–36)
aPTT: 65 seconds — ABNORMAL HIGH (ref 24–36)

## 2017-12-04 LAB — HEPARIN LEVEL (UNFRACTIONATED): Heparin Unfractionated: >2.2 IU/mL — ABNORMAL HIGH (ref 0.30–0.70)

## 2017-12-04 MED ORDER — ONDANSETRON HCL 4 MG PO TABS
4.0000 mg | ORAL_TABLET | Freq: Four times a day (QID) | ORAL | Status: DC | PRN
  Filled 2017-12-04: qty 1

## 2017-12-04 MED ORDER — HEPARIN (PORCINE) IN NACL 100-0.45 UNIT/ML-% IJ SOLN
900.0000 [IU]/h | INTRAMUSCULAR | Status: DC
  Administered 2017-12-04: 900 [IU]/h via INTRAVENOUS
  Filled 2017-12-04: qty 250

## 2017-12-04 MED ORDER — HYDRALAZINE HCL 20 MG/ML IJ SOLN
5.0000 mg | INTRAMUSCULAR | Status: DC | PRN
  Administered 2017-12-04: 5 mg via INTRAVENOUS
  Filled 2017-12-04: qty 1

## 2017-12-04 MED ORDER — SODIUM CHLORIDE 0.9% FLUSH
3.0000 mL | Freq: Two times a day (BID) | INTRAVENOUS | Status: DC
  Administered 2017-12-04 – 2017-12-10 (×8): 3 mL via INTRAVENOUS

## 2017-12-04 MED ORDER — POLYVINYL ALCOHOL 1.4 % OP SOLN
1.0000 [drp] | OPHTHALMIC | Status: DC | PRN
  Filled 2017-12-04: qty 15

## 2017-12-04 MED ORDER — ONDANSETRON HCL 4 MG/2ML IJ SOLN
4.0000 mg | Freq: Four times a day (QID) | INTRAMUSCULAR | Status: DC | PRN
  Administered 2017-12-06: 4 mg via INTRAVENOUS

## 2017-12-04 MED ORDER — HYDRALAZINE HCL 20 MG/ML IJ SOLN
5.0000 mg | INTRAMUSCULAR | Status: DC | PRN
  Administered 2017-12-05: 5 mg via INTRAVENOUS
  Filled 2017-12-04: qty 1

## 2017-12-04 MED ORDER — SODIUM CHLORIDE 0.9 % IV SOLN
INTRAVENOUS | Status: DC
  Administered 2017-12-04 – 2017-12-07 (×4): via INTRAVENOUS

## 2017-12-04 MED ORDER — MORPHINE SULFATE (PF) 4 MG/ML IV SOLN
4.0000 mg | Freq: Once | INTRAVENOUS | Status: AC
  Administered 2017-12-04: 4 mg via INTRAVENOUS
  Filled 2017-12-04: qty 1

## 2017-12-04 MED ORDER — PANTOPRAZOLE SODIUM 20 MG PO TBEC
20.0000 mg | DELAYED_RELEASE_TABLET | Freq: Every day | ORAL | Status: DC
  Administered 2017-12-04 – 2017-12-08 (×5): 20 mg via ORAL
  Filled 2017-12-04 (×6): qty 1

## 2017-12-04 MED ORDER — POLYETHYL GLYCOL-PROPYL GLYCOL 0.4-0.3 % OP GEL: Freq: Every day | OPHTHALMIC | Status: DC | PRN

## 2017-12-04 MED ORDER — ACETAMINOPHEN 650 MG RE SUPP: 650.0000 mg | Freq: Four times a day (QID) | RECTAL | Status: DC | PRN

## 2017-12-04 MED ORDER — MORPHINE SULFATE (PF) 2 MG/ML IV SOLN
2.0000 mg | INTRAVENOUS | Status: AC
  Filled 2017-12-04: qty 1

## 2017-12-04 MED ORDER — DILTIAZEM HCL ER COATED BEADS 120 MG PO CP24
120.0000 mg | ORAL_CAPSULE | Freq: Two times a day (BID) | ORAL | Status: DC
  Administered 2017-12-04 – 2017-12-11 (×15): 120 mg via ORAL
  Filled 2017-12-04 (×15): qty 1

## 2017-12-04 MED ORDER — MORPHINE SULFATE (PF) 2 MG/ML IV SOLN
2.0000 mg | INTRAVENOUS | Status: DC | PRN
  Administered 2017-12-04 – 2017-12-08 (×13): 2 mg via INTRAVENOUS
  Filled 2017-12-04 (×13): qty 1

## 2017-12-04 MED ORDER — ACETAMINOPHEN 325 MG PO TABS: 650.0000 mg | ORAL_TABLET | Freq: Four times a day (QID) | ORAL | Status: DC | PRN

## 2017-12-04 MED ORDER — ALBUTEROL SULFATE (2.5 MG/3ML) 0.083% IN NEBU: 2.5000 mg | INHALATION_SOLUTION | Freq: Four times a day (QID) | RESPIRATORY_TRACT | Status: DC | PRN

## 2017-12-04 NOTE — Progress Notes (Signed)
[place] [place] for IV heparin Indication: atrial fibrillation  Allergies  Allergen Reactions  . Antihistamines, Chlorpheniramine-Type Other (See Comments)    Pt states that she passed out.   [name] Penicillins Anaphylaxis and Other (See Comments)    Has patient had a PCN reaction causing immediate rash, facial/tongue/throat swelling, SOB or lightheadedness with hypotension: Yes Has patient had a PCN reaction causing severe rash involving mucus membranes or skin necrosis: No Has patient had a PCN reaction that required hospitalization No Has patient had a PCN reaction occurring within the last 10 years: No If all of the above answers are "NO", then may proceed with Cephalosporin use.  . Contrast Media [Iodinated Diagnostic Agents] Hives  . Sulfonamide Derivatives Other (See Comments)    Reaction:  Unknown   . Atorvastatin Other (See Comments)    Reaction:  Unknown   . Pheniramine Other (See Comments)    Pt states that she passed out.    Patient Measurements: Height: 5' 5.5" (166.4 cm) Weight: 163 lb 2.3 oz (74 kg) IBW/kg (Calculated) : 58.15 Heparin Dosing Weight: 73 kg  Vital Signs: Temp: 97.9 F (36.6 C) (07/03 1346) Temp Source: Oral (07/03 1346) BP: 143/87 (07/03 1346) Pulse Rate: 70 (07/03 1346)  Labs: Recent Labs    12/04/17 0219 12/04/17 0341 12/04/17 1152 12/04/17 1937  HGB 12.3  --   --   --   HCT 39.7  --   --   --   PLT 208  --   --   --   APTT  --   --  31 65*  LABPROT  --  15.7* 14.6  --   INR  --  1.26 1.15  --   HEPARINUNFRC  --   --  >2.20*  --   CREATININE 0.89  --   --   --    Estimated Creatinine Clearance: 40.2 mL/min (by C-G formula based on SCr of 0.89 mg/dL).  Medications:  Scheduled:  . diltiazem  120 mg Oral BID  .  morphine injection  2 mg Intravenous STAT  . pantoprazole  20 mg Oral Daily  . sodium chloride flush  3 mL Intravenous Q12H   PRN: acetaminophen **OR** acetaminophen, albuterol, hydrALAZINE,  morphine injection, ondansetron **OR** ondansetron (ZOFRAN) IV, polyvinyl alcohol  Assessment: 84 yoF with PMH Afib on Eliquis, HTN, HLD, CAD s/p PCI, CKD, nephrolithiasis, admitted 7/3 for cholelithiasis with concern for acute cholecystitis. Eliquis held for procedure, likely on 7/5. Pharmacy to dose heparin in the interim.  Prior anticoagulation: Eliquis 5 mg PO bid, LD 7/2 PM  DOACs will increase Heparin levels, need to monitor with aPTT  Today, 12/04/2017:  Begin Heparin infusion at 900 units/hr, no bolus   Check aPTT 8 hrs after start  ~ 12:30 pm  APTT at 1937 = 65 sec, RN noted some bleeding at IV site  Goal of Therapy: Heparin level 0.3-0.7 units/ml APTT 66-102 sec Monitor platelets by anticoagulation protocol: Yes  Plan:  Continue Heparin at 900 units/hr,(considered rate increase but will hold at same rate)  Check aPTT and Heparin level with am labs  Daily CBC  Continue to check aPTT and Hep levels until Hep levels in therapeutic range  Minda Ditto PharmD Pager [phone] 12/04/2017, 8:27 PM

## 2017-12-04 NOTE — Consult Note (Signed)
Reason for Consult:  abdominal pain Referring Physician: Tyishia Aune is an 84 y.o. female.  HPI:  Pt is a 84 yo F who presented to the ED with initially complaints of right flank pain.  Upon evaluation, she was found to also have abdominal pain in the epigastrium and the RUQ.  She had a CT within the last week after going to [facility] ED for abdominal pain.  She has felt bloated and very gaseous.  She describes the pain and a severe dull ache.  She had a few pain free moments since 6/29, but mostly has been having constant pain.  She has had no fever/chills/jaundice.  She denies diarrhea or constipation.  She has had intermittent mild symptoms for several months.  She has a history of gallstones and kidney stones.  CT done 6/29 showed gallstones, but no evidence of inflammation and no kidney stones.    She thinks she might have family history of gallstones, but definitely has family history of atrial fibrillation.    She has paroxysmal a fib and CAD.  She is on eliquis.    Past Medical History:  Diagnosis Date  . Anticoagulant long-term use    xarelto  . CAD S/P percutaneous coronary angioplasty cardiologist-  dr Dorris Carnes (previous cardiologist- dr Lia Foyer)   hx MI 04/ 1994  s/p  PCI to LAD  . CKD (chronic kidney disease), stage III ([place])   . Complication of anesthesia    hard to wake   . History of basal cell carcinoma (BCC) excision    right eye area 08/ 2013  . History of kidney stones 01/20/2017  . History of MI (myocardial infarction) 09/1992   s/p  PCI to LAD  . History of squamous cell carcinoma excision    right lower leg 2015 ;  2016  . Hyperlipidemia   . Hypertension   . Macular degeneration   . PAF (paroxysmal atrial fibrillation) ([place]) dx 02-24-2016   followed by cardiologist-- dr Mamie Nick. [name]  . PONV (postoperative nausea and vomiting)   . Right ureteral stone   . Wears glasses     Past Surgical History:  Procedure Laterality Date  . ABDOMINAL HYSTERECTOMY    .  BREAST EXCISIONAL BIOPSY Left 11/02/2009   fibroadenoma (lumpectomy)  . BROW LIFT Right 04/07/2014   Procedure: REPAIR OF ENTROPION AND TRICHIASIS OF RIGHT LOWER EYE LID ;  Surgeon: Theodoro Kos, DO;  Location: [facility];  Service: Plastics;  Laterality: Right;  . CARDIOVERSION N/A 07/18/2017   Procedure: CARDIOVERSION;  Surgeon: Skeet Latch, MD;  Location: [facility];  Service: Cardiovascular;  Laterality: N/A;  . CATARACT EXTRACTION W/ INTRAOCULAR LENS  IMPLANT, BILATERAL    . CORONARY ANGIOPLASTY  04/ 1994  dr Lia Foyer   PCI to LAD  . CYSTOSCOPY W/ URETERAL STENT PLACEMENT Right 01/20/2017   Procedure: CYSTOSCOPY WITH RIGHT RETROGRADE PYELOGRAM/RIGHT URETERAL STENT PLACEMENT;  Surgeon: Alexis Frock, MD;  Location: [facility];  Service: Urology;  Laterality: Right;  . CYSTOSCOPY WITH RETROGRADE PYELOGRAM, URETEROSCOPY AND STENT PLACEMENT Right 02/15/2017   Procedure: CYSTOSCOPY WITH RETROGRADE PYELOGRAM, URETEROSCOPY, STONE BASKETRY AND STENT REPLACEMENT;  Surgeon: Alexis Frock, MD;  Location: [facility];  Service: Urology;  Laterality: Right;  . CYSTOSCOPY/RETROGRADE/URETEROSCOPY/STONE EXTRACTION WITH BASKET  yrs ago  . DILATION AND CURETTAGE OF UTERUS    . HOLMIUM LASER APPLICATION Right 6/73/4193   Procedure: HOLMIUM LASER APPLICATION;  Surgeon: Alexis Frock, MD;  Location: [facility];  Service: Urology;  Laterality: Right;  . KNEE SURGERY     right  . NEUROPLASTY / TRANSPOSITION ULNAR NERVE AT ELBOW Left 02-23-2000    [facility]   and Left Carpal Tunnel Release  . TRANSTHORACIC ECHOCARDIOGRAM  02/26/2016   moderate focal basal and mild concentric LVH,  ef 55-60%,  akinesis of the basal-midanteroseptal, inferoseptal and apicalinferior myocardium, study not sufficient to evaluation diastolic funciton due to atrial fib./  trivial PR/  mild TR    Family History  Problem Relation Age of Onset  . Coronary artery disease Unknown         positive family hx of  . Cancer Unknown        family hx of  . Brain cancer Son   . Liver cancer Daughter     Social History:  reports that she has never smoked. She has never used smokeless tobacco. She reports that she does not drink alcohol or use drugs.  Allergies:  Allergies  Allergen Reactions  . Antihistamines, Chlorpheniramine-Type Other (See Comments)    Pt states that she passed out.   [name] Penicillins Anaphylaxis and Other (See Comments)    Has patient had a PCN reaction causing immediate rash, facial/tongue/throat swelling, SOB or lightheadedness with hypotension: Yes Has patient had a PCN reaction causing severe rash involving mucus membranes or skin necrosis: No Has patient had a PCN reaction that required hospitalization No Has patient had a PCN reaction occurring within the last 10 years: No If all of the above answers are "NO", then may proceed with Cephalosporin use.  . Contrast Media [Iodinated Diagnostic Agents] Hives  . Sulfonamide Derivatives Other (See Comments)    Reaction:  Unknown   . Atorvastatin Other (See Comments)    Reaction:  Unknown   . Pheniramine Other (See Comments)    Pt states that she passed out.     Medications:  Current Meds  Medication Sig  . acetaminophen (TYLENOL) 325 MG tablet Take 325 mg by mouth daily as needed for mild pain.   [name] diltiazem (CARDIZEM CD) 120 MG 24 hr capsule TAKE ONE CAPSULE BY MOUTH TWICE DAILY  . ELIQUIS 5 MG TABS tablet TAKE 1 TABLET BY MOUTH TWICE DAILY  . Multiple Vitamins-Minerals (ICAPS AREDS 2) CAPS Take 2 capsules by mouth daily.   Vladimir Faster Glycol-Propyl Glycol (SYSTANE OP) Place 1 drop into both eyes daily as needed (itchy eyes).  . triamcinolone (NASACORT ALLERGY 24HR) 55 MCG/ACT AERO nasal inhaler Place 1-2 sprays into the nose 2 (two) times daily as needed (for rhinitis).      Results for orders placed or performed during the hospital encounter of 12/04/17 (from the past 48 hour(s))  Comprehensive  metabolic panel     Status: Abnormal   Collection Time: 12/04/17  2:19 AM  Result Value Ref Range   Sodium 142 135 - 145 mmol/L   Potassium 4.1 3.5 - 5.1 mmol/L   Chloride 107 98 - 111 mmol/L    Comment: Please note change in reference range.   CO2 26 22 - 32 mmol/L   Glucose, Bld 116 (H) 70 - 99 mg/dL    Comment: Please note change in reference range.   BUN 16 8 - 23 mg/dL    Comment: Please note change in reference range.   Creatinine, Ser 0.89 0.44 - 1.00 mg/dL   Calcium 9.5 8.9 - 10.3 mg/dL   Total Protein 7.0 6.5 - 8.1 g/dL   Albumin 3.9 3.5 - 5.0 g/dL  AST 25 15 - 41 U/L   ALT 15 0 - 44 U/L    Comment: Please note change in reference range.   Alkaline Phosphatase 135 (H) 38 - 126 U/L   Total Bilirubin 0.6 0.3 - 1.2 mg/dL   GFR calc non Af Amer 54 (L) >60 mL/min   GFR calc Af Amer >60 >60 mL/min    Comment: (NOTE) The eGFR has been calculated using the CKD EPI equation. This calculation has not been validated in all clinical situations. eGFR's persistently <60 mL/min signify possible Chronic Kidney Disease.    Anion gap 9 5 - 15    Comment: Performed at [facility], [place] [address], [place], [place] [address]  CBC with Differential     Status: Abnormal   Collection Time: 12/04/17  2:19 AM  Result Value Ref Range   WBC 5.7 4.0 - 10.5 K/uL   RBC 4.50 3.87 - 5.11 MIL/uL   Hemoglobin 12.3 12.0 - 15.0 g/dL   HCT 39.7 36.0 - 46.0 %   MCV 88.2 78.0 - 100.0 fL   MCH 27.3 26.0 - 34.0 pg   MCHC 31.0 30.0 - 36.0 g/dL   RDW 17.6 (H) 11.5 - 15.5 %   Platelets 208 150 - 400 K/uL   Neutrophils Relative % 57 %   Neutro Abs 3.2 1.7 - 7.7 K/uL   Lymphocytes Relative 29 %   Lymphs Abs 1.6 0.7 - 4.0 K/uL   Monocytes Relative 11 %   Monocytes Absolute 0.6 0.1 - 1.0 K/uL   Eosinophils Relative 3 %   Eosinophils Absolute 0.2 0.0 - 0.7 K/uL   Basophils Relative 0 %   Basophils Absolute 0.0 0.0 - 0.1 K/uL    Comment: Performed at [facility],  [place] [address] [place], [place] [address]  Lipase, blood     Status: None   Collection Time: 12/04/17  2:19 AM  Result Value Ref Range   Lipase 28 11 - 51 U/L    Comment: Performed at [facility], [place] [address] [place], [place] [address]    US Abdomen Complete  Result Date: 12/04/2017 CLINICAL DATA:  Right upper quadrant and flank pain EXAM: ABDOMEN ULTRASOUND COMPLETE COMPARISON:  CT 11/30/2017 FINDINGS: Gallbladder: Multiple shadowing stones. Normal wall thickness. No sonographic Percell Miller indicated. Common bile duct: Diameter: 7.3 mm Liver: No focal lesion identified. Within normal limits in parenchymal echogenicity. Portal vein is patent on color Doppler imaging with normal direction of blood flow towards the liver. IVC: No abnormality visualized. Pancreas: 1.9 x 1.2 cm cystic lesion at the head of the pancreas. Spleen: Size and appearance within normal limits. Right Kidney: Length: 11.3 cm. Cortical echogenicity within normal limits. No hydronephrosis. 1.2 cm cyst in the midpole. Left Kidney: Length: 10.3 cm. Echogenicity within normal limits. No mass or hydronephrosis visualized. Abdominal aorta: No aneurysm visualized. Other findings: None. IMPRESSION: 1. Multiple gallstones without definite evidence for acute cholecystitis. Slightly enlarged common bile duct up to 7.3 mm, recommend correlation with LFTs. 2. 1.9 cm cyst at the head of the pancreas 3. Small cyst in the right kidney Electronically Signed   By: Donavan Foil M.D.   On: 12/04/2017 03:38    Review of Systems  Constitutional: Negative.   HENT: Positive for hearing loss.   Eyes: Positive for redness.       Issues chronically with lower right lid  Respiratory: Negative.   Cardiovascular: Positive for palpitations.  Gastrointestinal: Positive for abdominal pain,  nausea and vomiting. Negative for constipation and diarrhea.  Genitourinary: Positive for flank pain.       H/o kidney stones  Skin: Negative.    Neurological: Negative.   Endo/Heme/Allergies: Bruises/bleeds easily.  Psychiatric/Behavioral: Negative.    Blood pressure (!) 182/106, pulse 77, temperature 97.6 F (36.4 C), temperature source Oral, resp. rate 10, height 5' 5.5" (1.664 m), weight 72.6 kg (160 lb), SpO2 97 %. Physical Exam  Constitutional: She is oriented to person, place, and time. She appears well-developed and well-nourished. No distress.  HENT:  Head: Normocephalic.  Eyes: Pupils are equal, round, and reactive to light. Conjunctivae are normal. Right eye exhibits no discharge. Left eye exhibits no discharge. No scleral icterus.  Right eyelid does not close.  Appears chronic as it is fairly dry.  Neck: Neck supple. No tracheal deviation present. No thyromegaly present.  Cardiovascular: Normal rate, regular rhythm and intact distal pulses.  Respiratory: Effort normal and breath sounds normal. No respiratory distress. She has no wheezes. She exhibits no tenderness.  GI: Soft. She exhibits distension (mild distention). She exhibits no mass. There is tenderness (mild RUQ tenderness). There is no rebound and no guarding.  Musculoskeletal: She exhibits no edema or deformity.  Lymphadenopathy:    She has no cervical adenopathy.  Neurological: She is alert and oriented to person, place, and time. Coordination normal.  Appears to have difficulty hearing.    Skin: Skin is warm and dry. No rash noted. She is not diaphoretic. No erythema. No pallor.  Psychiatric: She has a normal mood and affect. Her behavior is normal. Judgment and thought content normal.    Assessment/Plan:  RUQ pain N/V Gallstones. Concerned for early acute cholecystitis based on tenderness and constant nature of pain at this point. Ultrasound negative for overt cholecystitis, but patient symptoms c/w early acute cholecystitis.   Elevated alk phos c/w gallbladder disease. Flank pain likely referred from RUQ.    Will need cardiac risk stratification  and potential palliative care consult.  She is not without risk with regard to anesthesia as it is recorded that she has had issues with significantly prolonged wakeup in the past.    She will need to have her blood thinners held at this point for potential further workup.  Dr. Marlou Starks will need to discuss with her and family possibility of surgery.      Stark Klein 12/04/2017, 3:46 AM

## 2017-12-04 NOTE — Progress Notes (Signed)
Shelly Obrien is a 84 y.o. female with medical history significant of HTN, HLD, PAF s/p cardioversion on Elquis, CAD s/p stents, CKD, and nephrolithiasis; who presents with complaints of right flank pain over the week.  Assessment :  1. Acute cholecystitis.  2. Paroxysmal atrial fibrillation 3. Hypertension 4. CAD 5. Stage 3 CKD.      Plan: Surgery consult  Cardiology consult for clearance.  IV heparin for anti coagulation.  Gentle hydration.    Hosie Poisson, MD [phone]

## 2017-12-04 NOTE — Progress Notes (Signed)
[facility] for IV heparin Indication: atrial fibrillation  Allergies  Allergen Reactions  . Antihistamines, Chlorpheniramine-Type Other (See Comments)    Pt states that she passed out.   [name] Penicillins Anaphylaxis and Other (See Comments)    Has patient had a PCN reaction causing immediate rash, facial/tongue/throat swelling, SOB or lightheadedness with hypotension: Yes Has patient had a PCN reaction causing severe rash involving mucus membranes or skin necrosis: No Has patient had a PCN reaction that required hospitalization No Has patient had a PCN reaction occurring within the last 10 years: No If all of the above answers are "NO", then may proceed with Cephalosporin use.  . Contrast Media [Iodinated Diagnostic Agents] Hives  . Sulfonamide Derivatives Other (See Comments)    Reaction:  Unknown   . Atorvastatin Other (See Comments)    Reaction:  Unknown   . Pheniramine Other (See Comments)    Pt states that she passed out.     Patient Measurements: Height: 5' 5.5" (166.4 cm) Weight: 163 lb 2.3 oz (74 kg) IBW/kg (Calculated) : 58.15 Heparin Dosing Weight: 73 kg  Vital Signs: Temp: 97.6 F (36.4 C) (07/03 0616) Temp Source: Oral (07/03 0116) BP: 186/109 (07/03 0616) Pulse Rate: 77 (07/03 0616)  Labs: Recent Labs    12/04/17 0219 12/04/17 0341  HGB 12.3  --   HCT 39.7  --   PLT 208  --   LABPROT  --  15.7*  INR  --  1.26  CREATININE 0.89  --     Estimated Creatinine Clearance: 40.2 mL/min (by C-G formula based on SCr of 0.89 mg/dL).   Medical History: Past Medical History:  Diagnosis Date  . Anticoagulant long-term use    xarelto  . CAD S/P percutaneous coronary angioplasty cardiologist-  dr Dorris Carnes (previous cardiologist- dr Lia Foyer)   hx MI 04/ 1994  s/p  PCI to LAD  . CKD (chronic kidney disease), stage III ([place])   . Complication of anesthesia    hard to wake   . History of basal cell carcinoma (BCC) excision    right eye area 08/ 2013  . History of kidney stones 01/20/2017  . History of MI (myocardial infarction) 09/1992   s/p  PCI to LAD  . History of squamous cell carcinoma excision    right lower leg 2015 ;  2016  . Hyperlipidemia   . Hypertension   . Macular degeneration   . PAF (paroxysmal atrial fibrillation) ([place]) dx 02-24-2016   followed by cardiologist-- dr Mamie Nick. [name]  . PONV (postoperative nausea and vomiting)   . Right ureteral stone   . Wears glasses     Medications:  Medications Prior to Admission  Medication Sig Dispense Refill Last Dose  . acetaminophen (TYLENOL) 325 MG tablet Take 325 mg by mouth daily as needed for mild pain.    Past Week at Unknown time  . diltiazem (CARDIZEM CD) 120 MG 24 hr capsule TAKE ONE CAPSULE BY MOUTH TWICE DAILY 180 capsule 3 Past Week at Unknown time  . ELIQUIS 5 MG TABS tablet TAKE 1 TABLET BY MOUTH TWICE DAILY 60 tablet 6 12/03/2017 at 2200  . Multiple Vitamins-Minerals (ICAPS AREDS 2) CAPS Take 2 capsules by mouth daily.    12/03/2017 at Unknown time  . Polyethyl Glycol-Propyl Glycol (SYSTANE OP) Place 1 drop into both eyes daily as needed (itchy eyes).   Past Week at Unknown time  . triamcinolone (NASACORT ALLERGY 24HR) 55 MCG/ACT AERO nasal inhaler Place  1-2 sprays into the nose 2 (two) times daily as needed (for rhinitis).    Past Month at Unknown time  . pantoprazole (PROTONIX) 20 MG tablet Take 1 tablet (20 mg total) by mouth daily. 21 tablet 1   . traMADol (ULTRAM) 50 MG tablet Take 1 tablet (50 mg total) by mouth every 6 (six) hours as needed for moderate pain or severe pain. Post-operatively. 15 tablet 0 Unk   Scheduled:  . diltiazem  120 mg Oral BID  .  morphine injection  2 mg Intravenous STAT  . pantoprazole  20 mg Oral Daily  . sodium chloride flush  3 mL Intravenous Q12H   PRN: acetaminophen **OR** acetaminophen, albuterol, hydrALAZINE, morphine injection, ondansetron **OR** ondansetron (ZOFRAN) IV, polyvinyl alcohol  Assessment: 12  yoF with PMH Afib on Eliquis, HTN, HLD, CAD s/p PCI, CKD, nephrolithiasis, admitted 7/3 for cholelithiasis with concern for acute cholecystitis. Eliquis held for procedure, likely on 7/5. Pharmacy to dose heparin in the interim.   Baseline INR, aPTT: pending  Prior anticoagulation: Eliquis 5 mg PO bid, LD 7/2 PM  Significant events:  Today, 12/04/2017:  CBC: wnl  No bleeding or infusion issues per nursing  CrCl: 40 ml/min  Has been ~ 12 hr since last Eliquis dose  Goal of Therapy: Heparin level 0.3-0.7 units/ml Monitor platelets by anticoagulation protocol: Yes  Plan:  No bolus  Heparin 900 units/hr IV infusion (this is slightly below 14 units/kg/hr for Afib, but will be conservative d/t age and renal impairment  Check aPTT 8 hrs after start  Daily CBC and heparin level, aPTT as needed until Eliquis effects have dissipated and aPTT correlating with heparin level  Monitor for signs of bleeding or thrombosis   Reuel Boom, PharmD, BCPS [phone] 12/04/2017, 10:50 AM

## 2017-12-04 NOTE — ED Provider Notes (Signed)
[facility] DEPT Provider Note   CSN: [id number] Arrival date & time: 12/04/17  0108     History   Chief Complaint Chief Complaint  Patient presents with  . Flank Pain  . Abdominal Pain    HPI Shelly [facility]rien is a 84 y.o. female.  Patient is a 85 year old female with past medical history of paroxysmal A. fib, coronary artery disease with prior stents, renal calculi.  She presents today for evaluation of flank pain.  This is been ongoing for the past several days.  She was seen at [facility] this past Friday.  She had a CT scan performed which showed gallstones, however no cholecystitis or other cause for her pain.  She describes ongoing pain since discharge that worsened this evening prompting her to call 911.  She denies any fevers or chills.  She denies any vomiting, diarrhea, or bloody stool.     Past Medical History:  Diagnosis Date  . Anticoagulant long-term use    xarelto  . CAD S/P percutaneous coronary angioplasty cardiologist-  dr Dorris Carnes (previous cardiologist- dr Lia Foyer)   hx MI 04/ 1994  s/p  PCI to LAD  . CKD (chronic kidney disease), stage III ([facility])   . Complication of anesthesia    hard to wake   . History of basal cell carcinoma (BCC) excision    right eye area 08/ 2013  . History of kidney stones 01/20/2017  . History of MI (myocardial infarction) 09/1992   s/p  PCI to LAD  . History of squamous cell carcinoma excision    right lower leg 2015 ;  2016  . Hyperlipidemia   . Hypertension   . Macular degeneration   . PAF (paroxysmal atrial fibrillation) ([facility]) dx 02-24-2016   followed by cardiologist-- dr Mamie Nick. [name]  . PONV (postoperative nausea and vomiting)   . Right ureteral stone   . Wears glasses     Patient Active Problem List   Diagnosis Date Noted  . Spinal stenosis in cervical region 04/24/2017  . Spinal stenosis of lumbar region 04/24/2017  . Dizziness 04/24/2017  . Ureteral stone with hydronephrosis 01/20/2017    . Atrial fibrillation with RVR ([facility]) 02/24/2016  . CKD (chronic kidney disease) stage 3, GFR 30-59 ml/min ([facility]) 02/24/2016  . Atrial fibrillation ([facility]) 02/24/2016  . Exudative macular degeneration ([facility]) 02/24/2016  . CHOLELITHIASIS 02/14/2009  . HYPERLIPIDEMIA 12/29/2008  . Essential hypertension 12/29/2008  . MYOCARDIAL INFARCTION, HX OF 12/29/2008  . CAD (coronary artery disease) 12/29/2008    Past Surgical History:  Procedure Laterality Date  . ABDOMINAL HYSTERECTOMY    . BREAST EXCISIONAL BIOPSY Left 11/02/2009   fibroadenoma (lumpectomy)  . BROW LIFT Right 04/07/2014   Procedure: REPAIR OF ENTROPION AND TRICHIASIS OF RIGHT LOWER EYE LID ;  Surgeon: Theodoro Kos, DO;  Location: [facility];  Service: Plastics;  Laterality: Right;  . CARDIOVERSION N/A 07/18/2017   Procedure: CARDIOVERSION;  Surgeon: Skeet Latch, MD;  Location: [facility];  Service: Cardiovascular;  Laterality: N/A;  . CATARACT EXTRACTION W/ INTRAOCULAR LENS  IMPLANT, BILATERAL    . CORONARY ANGIOPLASTY  04/ 1994  dr Lia Foyer   PCI to LAD  . CYSTOSCOPY W/ URETERAL STENT PLACEMENT Right 01/20/2017   Procedure: CYSTOSCOPY WITH RIGHT RETROGRADE PYELOGRAM/RIGHT URETERAL STENT PLACEMENT;  Surgeon: Alexis Frock, MD;  Location: [facility];  Service: Urology;  Laterality: Right;  . CYSTOSCOPY WITH RETROGRADE PYELOGRAM, URETEROSCOPY AND STENT PLACEMENT Right 02/15/2017   Procedure: CYSTOSCOPY WITH RETROGRADE  PYELOGRAM, URETEROSCOPY, STONE BASKETRY AND STENT REPLACEMENT;  Surgeon: Alexis Frock, MD;  Location: [facility];  Service: Urology;  Laterality: Right;  . CYSTOSCOPY/RETROGRADE/URETEROSCOPY/STONE EXTRACTION WITH BASKET  yrs ago  . DILATION AND CURETTAGE OF UTERUS    . HOLMIUM LASER APPLICATION Right 08/28/7122   Procedure: HOLMIUM LASER APPLICATION;  Surgeon: Alexis Frock, MD;  Location: [facility];  Service: Urology;  Laterality: Right;  . KNEE SURGERY     right   . NEUROPLASTY / TRANSPOSITION ULNAR NERVE AT ELBOW Left 02-23-2000    [facility]   and Left Carpal Tunnel Release  . TRANSTHORACIC ECHOCARDIOGRAM  02/26/2016   moderate focal basal and mild concentric LVH,  ef 55-60%,  akinesis of the basal-midanteroseptal, inferoseptal and apicalinferior myocardium, study not sufficient to evaluation diastolic funciton due to atrial fib./  trivial PR/  mild TR     OB History   None      Home Medications    Prior to Admission medications   Medication Sig Start Date End Date Taking? Authorizing Provider  acetaminophen (TYLENOL) 325 MG tablet Take 325 mg by mouth daily as needed for mild pain.     [provider]  diltiazem (CARDIZEM CD) 120 MG 24 hr capsule TAKE ONE CAPSULE BY MOUTH TWICE DAILY 10/02/17   Sherran Needs, NP  ELIQUIS 5 MG TABS tablet TAKE 1 TABLET BY MOUTH TWICE DAILY 11/27/17   Sherran Needs, NP  Multiple Vitamins-Minerals (ICAPS AREDS 2) CAPS Take 2 capsules by mouth daily.     [provider]  pantoprazole (PROTONIX) 20 MG tablet Take 1 tablet (20 mg total) by mouth daily. 11/30/17   Dorie Rank, MD  Polyethyl Glycol-Propyl Glycol (SYSTANE OP) Place 1 drop into both eyes daily as needed (itchy eyes).    [provider]  traMADol (ULTRAM) 50 MG tablet Take 1 tablet (50 mg total) by mouth every 6 (six) hours as needed for moderate pain or severe pain. Post-operatively. 02/15/17   Alexis Frock, MD  triamcinolone (KENALOG) 0.025 % cream Apply 1 application topically daily. For rash 11/26/17   [provider]  triamcinolone (NASACORT ALLERGY 24HR) 55 MCG/ACT AERO nasal inhaler Place 1-2 sprays into the nose 2 (two) times daily as needed (for rhinitis).     [provider]    Family History Family History  Problem Relation Age of Onset  . Coronary artery disease Unknown        positive family hx of  . Cancer Unknown        family hx of  . Brain cancer Son   . Liver cancer Daughter     Social  History Social History   Tobacco Use  . Smoking status: Never Smoker  . Smokeless tobacco: Never Used  Substance Use Topics  . Alcohol use: No    Alcohol/week: 0.0 oz  . Drug use: No     Allergies   Antihistamines, chlorpheniramine-type; Penicillins; Contrast media [iodinated diagnostic agents]; Sulfonamide derivatives; Atorvastatin; and Pheniramine   Review of Systems Review of Systems  All other systems reviewed and are negative.    Physical Exam Updated Vital Signs BP (!) 182/106 (BP Location: Right Arm)   Pulse 77   Temp 97.6 F (36.4 C) (Oral)   Resp 10   Ht 5' 5.5" (1.664 m)   Wt 72.6 kg (160 lb)   SpO2 97%   BMI 26.22 kg/m   Physical Exam  Constitutional: She is oriented to person, place, and time.  She appears well-developed and well-nourished. No distress.  HENT:  Head: Normocephalic and atraumatic.  Neck: Normal range of motion. Neck supple.  Cardiovascular: Normal rate and regular rhythm. Exam reveals no gallop and no friction rub.  No murmur heard. Pulmonary/Chest: Effort normal and breath sounds normal. No respiratory distress. She has no wheezes.  Abdominal: Soft. Bowel sounds are normal. She exhibits no distension. There is tenderness in the right upper quadrant and epigastric area. There is CVA tenderness. There is no rigidity, no rebound and no guarding.  There is tenderness in the right flank that reproduces her symptoms.  There is also mild tenderness in the right upper quadrant.  Musculoskeletal: Normal range of motion.  Neurological: She is alert and oriented to person, place, and time.  Skin: Skin is warm and dry. She is not diaphoretic.  Nursing note and vitals reviewed.    ED Treatments / Results  Labs (all labs ordered are listed, but only abnormal results are displayed) Labs Reviewed  COMPREHENSIVE METABOLIC PANEL  CBC WITH DIFFERENTIAL/PLATELET  LIPASE, BLOOD    EKG None  Radiology No results found.  Procedures Procedures  (including critical care time)  Medications Ordered in ED Medications  morphine 4 MG/ML injection 4 mg (has no administration in time range)     Initial Impression / Assessment and Plan / ED Course  I have reviewed the triage vital signs and the nursing notes.  Pertinent labs & imaging results that were available during my care of the patient were reviewed by me and considered in my medical decision making (see chart for details).  Ultrasound today shows slightly enlarged common bile duct along with multiple gallstones.  This patient and findings were were discussed with Dr. Barry Dienes from general surgery who is recommending admission to the hospitalist service and formal surgical consultation in the morning.  She has multiple medical issues including paroxysmal A. fib for which she is on Eliquis, coronary artery disease with stent, and advanced age.  Final Clinical Impressions(s) / ED Diagnoses   Final diagnoses:  None    ED Discharge Orders    None       Veryl Speak, MD 12/04/17 [phone]

## 2017-12-04 NOTE — Progress Notes (Signed)
S: pain in abdomen and R back. Wants to eat. Has some nausea but no vomiting. Patient's cousin is her POA, not currently present at bedside.   O: Gen: alert, NAD, pleasant Cardiac: regular rate and rhythm Lungs: CTAB Abd: soft, non-distended, mildly TTP in RUQ, +BS Skin: warm and dry Psych: A&Ox3  A/P: Paroxysmal A. Fib - followed by Dr. [name], hold eliquis, patient will need cardiac clearance HTN CAD - remote hx PCI to LAD CKD stage III Nephrolithiasis Pancreatic cyst  Cholelithiasis with concern for acute cholecystitis - [place] negative for overt cholecystitis, but symptomatically sounds like cholecystitis - WBC 5.7, afebrile - ALP 135, AST/ALT 25/15, Tbili 0.6 - could consider a HIDA scan? Will discuss with surgeon - if consistent with cholecystitis need to consider OR vs perc drain   FEN: ok to have clears as tolerated from our standpoint VTE: SCDs, ok with full dose lovenox or heparin while awaiting surgical clearance ID: no current abx  Brigid Re , [facility] Surgery 12/04/2017, 9:22 AM Pager: [phone] Consults: [phone] Mon-Fri 7:00 am-4:30 pm Sat-Sun 7:00 am-11:30 am

## 2017-12-04 NOTE — Progress Notes (Signed)
ED TO INPATIENT HANDOFF REPORT  [name]/Gender [facility] 84 y.o. female  Code Status    Code Status Orders  (From admission, onward)        Start     Ordered   12/04/17 0455  Full code  Continuous     12/04/17 0456    Code Status History    Date Active Date Inactive Code Status Order ID Comments User Context   01/20/2017 0720 01/21/2017 1432 Full Code [id number]  Shelly Frock, MD ED   02/24/2016 1412 02/27/2016 1900 Partial Code [id number]  Shelly Craze, NP ED      Home/SNF/Other Home  Chief Complaint Abdominal pain  Level of Care/Admitting Diagnosis ED Disposition    ED Disposition Condition [facility] Area: [facility] [[id number]]  Level of Care: Telemetry [5]  Admit to tele based on following criteria: Complex arrhythmia (Bradycardia/Tachycardia)  Diagnosis: Cholelithiasis [270623]  Admitting Physician: Shelly [facility] [[id number]]  Attending Physician: [facility] [[id number]]  Estimated length of stay: past midnight tomorrow  Certification:: I certify this patient will need inpatient services for at least 2 midnights  PT Class (Do Not Modify): Inpatient [101]  PT Acc Code (Do Not Modify): Private [1]       Medical History Past Medical History:  Diagnosis Date  . Anticoagulant long-term use    xarelto  . CAD S/P percutaneous coronary angioplasty cardiologist-  dr [facility] (previous cardiologist- dr [facility])   hx MI 04/ 1994  s/p  PCI to LAD  . CKD (chronic kidney disease), stage III ([place])   . Complication of anesthesia    hard to wake   . History of basal cell carcinoma (BCC) excision    right eye area 08/ 2013  . History of kidney stones 01/20/2017  . History of MI (myocardial infarction) 09/1992   s/p  PCI to LAD  . History of squamous cell carcinoma excision    right lower leg 2015 ;  2016  . Hyperlipidemia   . Hypertension   . Macular degeneration   . PAF (paroxysmal atrial fibrillation) ([place]) dx  02-24-2016   followed by cardiologist-- dr [facility]  . PONV (postoperative nausea and vomiting)   . Right ureteral stone   . Wears glasses     Allergies Allergies  Allergen Reactions  . Antihistamines, Chlorpheniramine-Type Other (See Comments)    Pt states that she passed out.   [name] Penicillins Anaphylaxis and Other (See Comments)    Has patient had a PCN reaction causing immediate rash, facial/tongue/throat swelling, SOB or lightheadedness with hypotension: Yes Has patient had a PCN reaction causing severe rash involving mucus membranes or skin necrosis: No Has patient had a PCN reaction that required hospitalization No Has patient had a PCN reaction occurring within the last 10 years: No If all of the above answers are "NO", then may proceed with Cephalosporin use.  . Contrast Media [Iodinated Diagnostic Agents] Hives  . Sulfonamide Derivatives Other (See Comments)    Reaction:  Unknown   . Atorvastatin Other (See Comments)    Reaction:  Unknown   . Pheniramine Other (See Comments)    Pt states that she passed out.     IV Location/Drains/Wounds Patient Lines/Drains/Airways Status   Active Line/Drains/Airways    Name:   Placement date:   Placement time:   Site:   Days:   Peripheral IV 11/30/17 Right Forearm   11/30/17    1112    Forearm  4   Peripheral IV 12/04/17 Left Forearm   12/04/17    0143    Forearm   less than 1   Ureteral Drain/Stent Right ureter 5 Fr.   02/15/17    1506    Right ureter   292   External Urinary Catheter   11/30/17    1342    -   4   Incision (Closed) 04/07/14 Eye Right   04/07/14    1049     1337   Incision (Closed) 04/07/14 Neck Other (Comment)   04/07/14    1104     1337   Incision (Closed) 01/20/17 Perineum Other (Comment)   01/20/17    1109     318   Incision (Closed) 02/15/17 Perineum Right   02/15/17    1430     292          Labs/Imaging Results for orders placed or performed during the hospital encounter of 12/04/17 (from the past 48  hour(s))  Comprehensive metabolic panel     Status: Abnormal   Collection Time: 12/04/17  2:19 AM  Result Value Ref Range   Sodium 142 135 - 145 mmol/L   Potassium 4.1 3.5 - 5.1 mmol/L   Chloride 107 98 - 111 mmol/L    Comment: Please note change in reference range.   CO2 26 22 - 32 mmol/L   Glucose, Bld 116 (H) 70 - 99 mg/dL    Comment: Please note change in reference range.   BUN 16 8 - 23 mg/dL    Comment: Please note change in reference range.   Creatinine, Ser 0.89 0.44 - 1.00 mg/dL   Calcium 9.5 8.9 - 10.3 mg/dL   Total Protein 7.0 6.5 - 8.1 g/dL   Albumin 3.9 3.5 - 5.0 g/dL   AST 25 15 - 41 U/L   ALT 15 0 - 44 U/L    Comment: Please note change in reference range.   Alkaline Phosphatase 135 (H) 38 - 126 U/L   Total Bilirubin 0.6 0.3 - 1.2 mg/dL   GFR calc non Af Amer 54 (L) >60 mL/min   GFR calc Af Amer >60 >60 mL/min    Comment: (NOTE) The eGFR has been calculated using the CKD EPI equation. This calculation has not been validated in all clinical situations. eGFR's persistently <60 mL/min signify possible Chronic Kidney Disease.    Anion gap 9 5 - 15    Comment: Performed at [facility], [place] [address] [place], [place] [address]  CBC with Differential     Status: Abnormal   Collection Time: 12/04/17  2:19 AM  Result Value Ref Range   WBC 5.7 4.0 - 10.5 K/uL   RBC 4.50 3.87 - 5.11 MIL/uL   Hemoglobin 12.3 12.0 - 15.0 g/dL   HCT 39.7 36.0 - 46.0 %   MCV 88.2 78.0 - 100.0 fL   MCH 27.3 26.0 - 34.0 pg   MCHC 31.0 30.0 - 36.0 g/dL   RDW 17.6 (H) 11.5 - 15.5 %   Platelets 208 150 - 400 K/uL   Neutrophils Relative % 57 %   Neutro Abs 3.2 1.7 - 7.7 K/uL   Lymphocytes Relative 29 %   Lymphs Abs 1.6 0.7 - 4.0 K/uL   Monocytes Relative 11 %   Monocytes Absolute 0.6 0.1 - 1.0 K/uL   Eosinophils Relative 3 %   Eosinophils Absolute 0.2 0.0 - 0.7 K/uL   Basophils Relative 0 %   Basophils  Absolute 0.0 0.0 - 0.1 K/uL    Comment: Performed at [facility], [place] [address] [place], [place] [address]  Lipase, blood     Status: None   Collection Time: 12/04/17  2:19 AM  Result Value Ref Range   Lipase 28 11 - 51 U/L    Comment: Performed at [facility], [place] [address], [place], [place] [address]  Protime-INR     Status: Abnormal   Collection Time: 12/04/17  3:41 AM  Result Value Ref Range   Prothrombin Time 15.7 (H) 11.4 - 15.2 seconds   INR 1.26     Comment: Performed at [facility], [place] [address] [place], [place] [address]   US Abdomen Complete  Result Date: 12/04/2017 CLINICAL DATA:  Right upper quadrant and flank pain EXAM: ABDOMEN ULTRASOUND COMPLETE COMPARISON:  CT 11/30/2017 FINDINGS: Gallbladder: Multiple shadowing stones. Normal wall thickness. No sonographic Percell Miller indicated. Common bile duct: Diameter: 7.3 mm Liver: No focal lesion identified. Within normal limits in parenchymal echogenicity. Portal vein is patent on color Doppler imaging with normal direction of blood flow towards the liver. IVC: No abnormality visualized. Pancreas: 1.9 x 1.2 cm cystic lesion at the head of the pancreas. Spleen: Size and appearance within normal limits. Right Kidney: Length: 11.3 cm. Cortical echogenicity within normal limits. No hydronephrosis. 1.2 cm cyst in the midpole. Left Kidney: Length: 10.3 cm. Echogenicity within normal limits. No mass or hydronephrosis visualized. Abdominal aorta: No aneurysm visualized. Other findings: None. IMPRESSION: 1. Multiple gallstones without definite evidence for acute cholecystitis. Slightly enlarged common bile duct up to 7.3 mm, recommend correlation with LFTs. 2. 1.9 cm cyst at the head of the pancreas 3. Small cyst in the right kidney Electronically Signed   By: Donavan Foil M.D.   On: 12/04/2017 03:38    Pending Labs Unresulted Labs (From admission, onward)   Start     Ordered   12/05/17 0500  CBC  Tomorrow morning,   R     12/04/17 0456    12/05/17 0500  Comprehensive metabolic panel  Tomorrow morning,   R     12/04/17 0456   12/04/17 0401  Protime-INR  Once,   STAT     12/04/17 0400      Vitals/Pain Today's Vitals   12/04/17 0300 12/04/17 0304 12/04/17 0518 12/04/17 0530  BP: (!) 164/97  (!) 181/104 (!) 169/103  Pulse: 72  74 73  Resp: 18  10 (!) 9  Temp:      TempSrc:      SpO2: 94%  99% 97%  Weight:      Height:      PainSc:  Asleep      Isolation Precautions No active isolations  Medications Medications  diltiazem (CARDIZEM CD) 24 hr capsule 120 mg (has no administration in time range)  polyethylene glycol 0.4% and propylene glycol 0.3% (SYSTANE) ophthalmic gel (has no administration in time range)  pantoprazole (PROTONIX) EC tablet 20 mg (has no administration in time range)  sodium chloride flush (NS) 0.9 % injection 3 mL (has no administration in time range)  0.9 %  sodium chloride infusion ( Intravenous New Bag/Given 12/04/17 0551)  ondansetron (ZOFRAN) tablet 4 mg (has no administration in time range)    Or  ondansetron (ZOFRAN) injection 4 mg (has no administration in time range)  acetaminophen (TYLENOL) tablet 650 mg (has no administration in time range)    Or  acetaminophen (TYLENOL) suppository 650 mg (has  no administration in time range)  albuterol (PROVENTIL) (2.5 MG/3ML) 0.083% nebulizer solution 2.5 mg (has no administration in time range)  hydrALAZINE (APRESOLINE) injection 5 mg (has no administration in time range)  morphine 2 MG/ML injection 2 mg (has no administration in time range)  morphine 2 MG/ML injection 2 mg (has no administration in time range)  morphine 4 MG/ML injection 4 mg (4 mg Intravenous Given 12/04/17 0213)    Mobility walks

## 2017-12-04 NOTE — ED Triage Notes (Signed)
Patient complains of right flank pain that starts in the abdomen and radiates back. She was recently found to have gallstones and given pain medication. Since then she has not been able to sleep due to pain. Pain increases with urination and travels down the back. She reports no blood in urine.

## 2017-12-04 NOTE — ED Notes (Signed)
Bed: XI35 Expected date:  Expected time:  Means of arrival:  Comments: EMS 84 yo female right flank pain x 4 days 8/10 pain with urination BP 182/102 O2 98% RA

## 2017-12-04 NOTE — H&P (Signed)
History and Physical    Shelly Obrien:[id number] DOB: [birth year] DOA: 12/04/2017  Referring MD/NP/PA: Dr. Stark Jock PCP: [name], MD  Patient coming from: Home  Chief Complaint: Right flank pain  I have personally briefly reviewed patient's old medical records in [place]   HPI: Shelly Obrien is a 84 y.o. female with medical history significant of HTN, HLD, PAF s/p cardioversion on Elquis, CAD s/p stents, CKD, and nephrolithiasis; who presents with complaints of right flank pain over the week.  Pain is also located in the right upper quadrant of her abdomen.  Symptoms are usually worsened after eating.  Associated symptoms include abdominal distention, nausea, and poor appetite.  Denies having any vomiting, chest pain, shortness of breath, diarrhea, dysuria, leg swelling, fever, or chills.  She was initially evaluated in the emergency department for the symptoms 5 days ago. A CT scan of the abdomen and pelvis revealed cholelithiasis and nonobstructing bilateral renal calculi.  She was discharged home with noted pain symptoms persisted.  At baseline patient lives alone and is able to complete all of her ADLs without assistance.  ED Course: Upon admission into the emergency department patient was seen to be afebrile, blood pressure 182/106, and all other vital signs relatively within normal limits.  Labs are relatively unremarkable except for mildly elevated alkaline phosphatase of 135.  Abdominal ultrasound revealed cholelithiasis with slightly enlarged common bile duct of 7.3 without overt signs of acute cholecystitis.  Dr. Barry Dienes of General surgery was consulted.  TRH called to admit given patient's comorbidities.  Review of Systems  Constitutional: Positive for malaise/fatigue. Negative for chills, fever and weight loss.  HENT: Negative for ear discharge and nosebleeds.   Eyes: Negative for double vision and photophobia.  Respiratory: Negative for cough and shortness of breath.     Cardiovascular: Negative for chest pain and leg swelling.  Gastrointestinal: Positive for abdominal pain and nausea. Negative for vomiting.  Genitourinary: Positive for flank pain. Negative for dysuria and hematuria.  Musculoskeletal: Negative for falls and myalgias.  Neurological: Negative for focal weakness and loss of consciousness.  Psychiatric/Behavioral: Negative for substance abuse and suicidal ideas.    Past Medical History:  Diagnosis Date  . Anticoagulant long-term use    xarelto  . CAD S/P percutaneous coronary angioplasty cardiologist-  dr Dorris Carnes (previous cardiologist- dr Lia Foyer)   hx MI 04/ 1994  s/p  PCI to LAD  . CKD (chronic kidney disease), stage III ([place])   . Complication of anesthesia    hard to wake   . History of basal cell carcinoma (BCC) excision    right eye area 08/ 2013  . History of kidney stones 01/20/2017  . History of MI (myocardial infarction) 09/1992   s/p  PCI to LAD  . History of squamous cell carcinoma excision    right lower leg 2015 ;  2016  . Hyperlipidemia   . Hypertension   . Macular degeneration   . PAF (paroxysmal atrial fibrillation) ([place]) dx 02-24-2016   followed by cardiologist-- dr Mamie Nick. [name]  . PONV (postoperative nausea and vomiting)   . Right ureteral stone   . Wears glasses     Past Surgical History:  Procedure Laterality Date  . ABDOMINAL HYSTERECTOMY    . BREAST EXCISIONAL BIOPSY Left 11/02/2009   fibroadenoma (lumpectomy)  . BROW LIFT Right 04/07/2014   Procedure: REPAIR OF ENTROPION AND TRICHIASIS OF RIGHT LOWER EYE LID ;  Surgeon: Theodoro Kos, DO;  Location: [facility];  Service: Clinical cytogeneticist;  Laterality: Right;  . CARDIOVERSION N/A 07/18/2017   Procedure: CARDIOVERSION;  Surgeon: Skeet Latch, MD;  Location: [place];  Service: Cardiovascular;  Laterality: N/A;  . CATARACT EXTRACTION W/ INTRAOCULAR LENS  IMPLANT, BILATERAL    . CORONARY ANGIOPLASTY  04/ 1994  dr Lia Foyer   PCI to LAD  .  CYSTOSCOPY W/ URETERAL STENT PLACEMENT Right 01/20/2017   Procedure: CYSTOSCOPY WITH RIGHT RETROGRADE PYELOGRAM/RIGHT URETERAL STENT PLACEMENT;  Surgeon: Alexis Frock, MD;  Location: [facility];  Service: Urology;  Laterality: Right;  . CYSTOSCOPY WITH RETROGRADE PYELOGRAM, URETEROSCOPY AND STENT PLACEMENT Right 02/15/2017   Procedure: CYSTOSCOPY WITH RETROGRADE PYELOGRAM, URETEROSCOPY, STONE BASKETRY AND STENT REPLACEMENT;  Surgeon: Alexis Frock, MD;  Location: [facility];  Service: Urology;  Laterality: Right;  . CYSTOSCOPY/RETROGRADE/URETEROSCOPY/STONE EXTRACTION WITH BASKET  yrs ago  . DILATION AND CURETTAGE OF UTERUS    . HOLMIUM LASER APPLICATION Right 7/51/0258   Procedure: HOLMIUM LASER APPLICATION;  Surgeon: Alexis Frock, MD;  Location: [facility];  Service: Urology;  Laterality: Right;  . KNEE SURGERY     right  . NEUROPLASTY / TRANSPOSITION ULNAR NERVE AT ELBOW Left 02-23-2000    [facility]   and Left Carpal Tunnel Release  . TRANSTHORACIC ECHOCARDIOGRAM  02/26/2016   moderate focal basal and mild concentric LVH,  ef 55-60%,  akinesis of the basal-midanteroseptal, inferoseptal and apicalinferior myocardium, study not sufficient to evaluation diastolic funciton due to atrial fib./  trivial PR/  mild TR     reports that she has never smoked. She has never used smokeless tobacco. She reports that she does not drink alcohol or use drugs.  Allergies  Allergen Reactions  . Antihistamines, Chlorpheniramine-Type Other (See Comments)    Pt states that she passed out.   [name] Penicillins Anaphylaxis and Other (See Comments)    Has patient had a PCN reaction causing immediate rash, facial/tongue/throat swelling, SOB or lightheadedness with hypotension: Yes Has patient had a PCN reaction causing severe rash involving mucus membranes or skin necrosis: No Has patient had a PCN reaction that required hospitalization No Has patient had a PCN reaction occurring within the  last 10 years: No If all of the above answers are "NO", then may proceed with Cephalosporin use.  . Contrast Media [Iodinated Diagnostic Agents] Hives  . Sulfonamide Derivatives Other (See Comments)    Reaction:  Unknown   . Atorvastatin Other (See Comments)    Reaction:  Unknown   . Pheniramine Other (See Comments)    Pt states that she passed out.     Family History  Problem Relation Age of Onset  . Coronary artery disease Unknown        positive family hx of  . Cancer Unknown        family hx of  . Brain cancer Son   . Liver cancer Daughter     Prior to Admission medications   Medication Sig Start Date End Date Taking? Authorizing Provider  acetaminophen (TYLENOL) 325 MG tablet Take 325 mg by mouth daily as needed for mild pain.    Yes [provider]  diltiazem (CARDIZEM CD) 120 MG 24 hr capsule TAKE ONE CAPSULE BY MOUTH TWICE DAILY 10/02/17  Yes Sherran Needs, NP  ELIQUIS 5 MG TABS tablet TAKE 1 TABLET BY MOUTH TWICE DAILY 11/27/17  Yes Sherran Needs, NP  Multiple Vitamins-Minerals (ICAPS AREDS 2) CAPS Take 2 capsules by mouth daily.    Yes [provider]  Polyethyl Glycol-Propyl Glycol (SYSTANE OP) Place 1 drop into both eyes daily as needed (itchy eyes).   Yes [provider]  triamcinolone (NASACORT ALLERGY 24HR) 55 MCG/ACT AERO nasal inhaler Place 1-2 sprays into the nose 2 (two) times daily as needed (for rhinitis).    Yes [provider]  pantoprazole (PROTONIX) 20 MG tablet Take 1 tablet (20 mg total) by mouth daily. 11/30/17   Dorie Rank, MD  traMADol (ULTRAM) 50 MG tablet Take 1 tablet (50 mg total) by mouth every 6 (six) hours as needed for moderate pain or severe pain. Post-operatively. 02/15/17   Alexis Frock, MD    Physical Exam:  Constitutional: NAD, calm, comfortable Vitals:   12/04/17 0116 12/04/17 0126 12/04/17 0155  BP: (!) 182/106    Pulse: 77    Resp: 10    Temp: 97.6 F (36.4 C)    TempSrc: Oral    SpO2:  98%  97%  Weight:  72.6 kg (160 lb)   Height:  5' 5.5" (1.664 m)    Eyes: PERRL, lids and conjunctivae normal ENMT: Mucous membranes are dry. Posterior pharynx clear of any exudate or lesions.   Neck: normal, supple, no masses, no thyromegaly Respiratory: clear to auscultation bilaterally, no wheezing, no crackles. Normal respiratory effort. No accessory muscle use.  Cardiovascular: Regular rate and rhythm, no murmurs / rubs / gallops. No extremity edema. 2+ pedal pulses. No carotid bruits.  Abdomen: Right upper quadrant, no masses palpated. No hepatosplenomegaly. Bowel sounds positive.  Musculoskeletal: no clubbing / cyanosis. No joint deformity upper and lower extremities. Good ROM, no contractures. Normal muscle tone.  Skin: no rashes, lesions, ulcers. No induration Neurologic: CN 2-12 grossly intact. Sensation intact, DTR normal. Strength 5/5 in all 4.  Psychiatric: Normal judgment and insight. Alert and oriented x 3. Normal mood.     Labs on Admission: I have personally reviewed following labs and imaging studies  CBC: Recent Labs  Lab 11/30/17 0843 12/04/17 0219  WBC 5.7 5.7  NEUTROABS  --  3.2  HGB 12.1 12.3  HCT 40.5 39.7  MCV 89.0 88.2  PLT 218 784   Basic Metabolic Panel: Recent Labs  Lab 11/30/17 0843 12/04/17 0219  NA 141 142  K 3.9 4.1  CL 106 107  CO2 22 26  GLUCOSE 151* 116*  BUN 16 16  CREATININE 1.00 0.89  CALCIUM 9.4 9.5   GFR: Estimated Creatinine Clearance: 39.9 mL/min (by C-G formula based on SCr of 0.89 mg/dL). Liver Function Tests: Recent Labs  Lab 11/30/17 0843 12/04/17 0219  AST 23 25  ALT 13 15  ALKPHOS 127* 135*  BILITOT 0.8 0.6  PROT 6.5 7.0  ALBUMIN 3.5 3.9   Recent Labs  Lab 11/30/17 0843 12/04/17 0219  LIPASE 30 28   No results for input(s): AMMONIA in the last 168 hours. Coagulation Profile: Recent Labs  Lab 12/04/17 0341  INR 1.26   Cardiac Enzymes: No results for input(s): CKTOTAL, CKMB, CKMBINDEX, TROPONINI in  the last 168 hours. BNP (last 3 results) No results for input(s): PROBNP in the last 8760 hours. HbA1C: No results for input(s): HGBA1C in the last 72 hours. CBG: No results for input(s): GLUCAP in the last 168 hours. Lipid Profile: No results for input(s): CHOL, HDL, LDLCALC, TRIG, CHOLHDL, LDLDIRECT in the last 72 hours. Thyroid Function Tests: No results for input(s): TSH, T4TOTAL, FREET4, T3FREE, THYROIDAB in the last 72 hours. Anemia Panel: No results for input(s): VITAMINB12, FOLATE, FERRITIN, TIBC, IRON, RETICCTPCT in the  last 72 hours. Urine analysis:    Component Value Date/Time   COLORURINE YELLOW 11/30/2017 [place] 11/30/2017 1358   LABSPEC 1.011 11/30/2017 1358   PHURINE 6.0 11/30/2017 1358   GLUCOSEU NEGATIVE 11/30/2017 1358   HGBUR NEGATIVE 11/30/2017 1358   BILIRUBINUR NEGATIVE 11/30/2017 1358   KETONESUR NEGATIVE 11/30/2017 1358   PROTEINUR NEGATIVE 11/30/2017 1358   UROBILINOGEN 2.0 (H) 01/30/2016 1307   NITRITE NEGATIVE 11/30/2017 1358   LEUKOCYTESUR NEGATIVE 11/30/2017 1358   Sepsis Labs: No results found for this or any previous visit (from the past 240 hour(s)).   Radiological Exams on Admission: US Abdomen Complete  Result Date: 12/04/2017 CLINICAL DATA:  Right upper quadrant and flank pain EXAM: ABDOMEN ULTRASOUND COMPLETE COMPARISON:  CT 11/30/2017 FINDINGS: Gallbladder: Multiple shadowing stones. Normal wall thickness. No sonographic Percell Miller indicated. Common bile duct: Diameter: 7.3 mm Liver: No focal lesion identified. Within normal limits in parenchymal echogenicity. Portal vein is patent on color Doppler imaging with normal direction of blood flow towards the liver. IVC: No abnormality visualized. Pancreas: 1.9 x 1.2 cm cystic lesion at the head of the pancreas. Spleen: Size and appearance within normal limits. Right Kidney: Length: 11.3 cm. Cortical echogenicity within normal limits. No hydronephrosis. 1.2 cm cyst in the midpole. Left  Kidney: Length: 10.3 cm. Echogenicity within normal limits. No mass or hydronephrosis visualized. Abdominal aorta: No aneurysm visualized. Other findings: None. IMPRESSION: 1. Multiple gallstones without definite evidence for acute cholecystitis. Slightly enlarged common bile duct up to 7.3 mm, recommend correlation with LFTs. 2. 1.9 cm cyst at the head of the pancreas 3. Small cyst in the right kidney Electronically Signed   By: Donavan Foil M.D.   On: 12/04/2017 03:38      Assessment/Plan Right flank/upper quadrant abdominal secondary to cholelithiasis: Acute.  Patient presents with worsening abdominal pain.  Ultrasound revealing multiple gallstones with slightly enlarged common bile duct of 7.3 mm.  Alkaline phosphatase only mildly elevated at 136.  Suspect symptoms likely related to cholelithiasis with question of early cholecystitis.  Based off the geriatric sensitive perioperative cardiac risk assessment patient probability of perioperative myocardial infarction or cardiac arrest is 0.5-1.3%. - Admit to a telemetry bed - N.p.o. - Morphine 2 mg IV as needed for pain - Gentle IV fluids overnight - Appreciate general surgery consultative services will follow-up for further recommendations  Proximal atrial fibrillation on chronic anticoagulation of Eliquis:CHA2DS2-VASc score =5.  Patient is currently in sinus rhythm after recent cardioversion.  Followed by Dr.Ross of cardiology.   - Continue Cardizem - Initially held Eliquis for possible need of surgical procedure - Consider utilizing Lovenox/heparin if needed  Essential hypertension: Mildly elevated blood pressures on admission of 186/106. - Continue Cardizem - Low-dose IV hydralazine as needed for elevated blood pressures  Coronary artery disease: Patient with a remote history of PCI to the LAD. - Continue to monitor  Chronic kidney disease stage III: Stable.  Nephrolithiasis: CT scan from 6/29 showed nonobstructive bilateral renal  calculi.  Pancreatic cyst: 1.9 cm pancreatic cyst noted at the head of the pancreas on ultrasound.  DVT prophylaxis: scds Code Status: Full Family Communication:no family present at this time, but patient's POA to be present in a.m. Disposition Plan: TBD  Consults called: Surgery Admission status: Inpatient  Norval Morton MD [facility] Pager [phone]   If 7PM-7AM, please contact night-coverage [email or web address] Password [facility]  12/04/2017, 4:35 AM

## 2017-12-05 ENCOUNTER — Encounter (HOSPITAL_COMMUNITY): Payer: Self-pay | Admitting: Cardiology

## 2017-12-05 DIAGNOSIS — I10 Essential (primary) hypertension: Secondary | ICD-10-CM

## 2017-12-05 DIAGNOSIS — K810 Acute cholecystitis: Secondary | ICD-10-CM

## 2017-12-05 DIAGNOSIS — K8020 Calculus of gallbladder without cholecystitis without obstruction: Secondary | ICD-10-CM

## 2017-12-05 DIAGNOSIS — N183 Chronic kidney disease, stage 3 (moderate): Secondary | ICD-10-CM

## 2017-12-05 DIAGNOSIS — Z01810 Encounter for preprocedural cardiovascular examination: Secondary | ICD-10-CM

## 2017-12-05 DIAGNOSIS — I480 Paroxysmal atrial fibrillation: Secondary | ICD-10-CM

## 2017-12-05 DIAGNOSIS — I481 Persistent atrial fibrillation: Secondary | ICD-10-CM

## 2017-12-05 LAB — COMPREHENSIVE METABOLIC PANEL
ALT: 14 U/L (ref 0–44)
AST: 21 U/L (ref 15–41)
Albumin: 3.3 g/dL — ABNORMAL LOW (ref 3.5–5.0)
Alkaline Phosphatase: 117 U/L (ref 38–126)
Anion gap: 8 (ref 5–15)
BUN: 11 mg/dL (ref 8–23)
CO2: 26 mmol/L (ref 22–32)
Calcium: 9.5 mg/dL (ref 8.9–10.3)
Chloride: 106 mmol/L (ref 98–111)
Creatinine, Ser: 0.77 mg/dL (ref 0.44–1.00)
GFR calc Af Amer: >60 mL/min (ref >60)
GFR calc non Af Amer: >60 mL/min (ref >60)
Glucose, Bld: 95 mg/dL (ref 70–99)
Potassium: 4 mmol/L (ref 3.5–5.1)
Sodium: 140 mmol/L (ref 135–145)
Total Bilirubin: 1 mg/dL (ref 0.3–1.2)
Total Protein: 6.1 g/dL — ABNORMAL LOW (ref 6.5–8.1)

## 2017-12-05 LAB — HEPARIN LEVEL (UNFRACTIONATED)
Heparin Unfractionated: 1.14 IU/mL — ABNORMAL HIGH (ref 0.30–0.70)
Heparin Unfractionated: 1.1 IU/mL — ABNORMAL HIGH (ref 0.30–0.70)

## 2017-12-05 LAB — APTT
aPTT: 70 seconds — ABNORMAL HIGH (ref 24–36)
aPTT: 71 seconds — ABNORMAL HIGH (ref 24–36)

## 2017-12-05 MED ORDER — CHLORHEXIDINE GLUCONATE CLOTH 2 % EX PADS
6.0000 | MEDICATED_PAD | Freq: Once | CUTANEOUS | Status: AC
  Administered 2017-12-06: 6 via TOPICAL

## 2017-12-05 MED ORDER — HEPARIN (PORCINE) IN NACL 100-0.45 UNIT/ML-% IJ SOLN
900.0000 [IU]/h | INTRAMUSCULAR | Status: AC
  Administered 2017-12-05: 900 [IU]/h via INTRAVENOUS
  Filled 2017-12-05: qty 250

## 2017-12-05 MED ORDER — CLINDAMYCIN PHOSPHATE 900 MG/50ML IV SOLN
900.0000 mg | INTRAVENOUS | Status: AC
Start: 2017-12-06 — End: 2017-12-06
  Administered 2017-12-06: 900 mg via INTRAVENOUS
  Filled 2017-12-05 (×3): qty 50

## 2017-12-05 MED ORDER — GABAPENTIN 300 MG PO CAPS
300.0000 mg | ORAL_CAPSULE | ORAL | Status: AC
  Administered 2017-12-06: 300 mg via ORAL
  Filled 2017-12-05: qty 1

## 2017-12-05 MED ORDER — ACETAMINOPHEN 500 MG PO TABS
1000.0000 mg | ORAL_TABLET | ORAL | Status: AC
  Administered 2017-12-06: 1000 mg via ORAL
  Filled 2017-12-05: qty 2

## 2017-12-05 MED ORDER — CHLORHEXIDINE GLUCONATE CLOTH 2 % EX PADS
6.0000 | MEDICATED_PAD | Freq: Once | CUTANEOUS | Status: AC
  Administered 2017-12-05: 6 via TOPICAL

## 2017-12-05 MED ORDER — GENTAMICIN SULFATE 40 MG/ML IJ SOLN
5.0000 mg/kg | INTRAVENOUS | Status: DC
  Filled 2017-12-05 (×2): qty 9.25

## 2017-12-05 NOTE — Progress Notes (Addendum)
Shelly Obrien [id number] 15-Aug-1922  CARE TEAM:  PCP: [name], MD  Outpatient Care Team: Patient Care Team: [name], MD as PCP - General (Internal Medicine) Fay Records, MD as PCP - Cardiology (Cardiology)  Inpatient Treatment Team: Treatment Team: Attending Provider: Hosie Poisson, MD; Rounding Team: Edison Pace, Md, MD; Rounding Team: Threasa Beards, MD; Technician: Angie Fava, NT; Registered Nurse: Marcello Fennel, RN   Problem List:   Principal Problem:   Abdominal pain Active Problems:   Essential hypertension   CAD (coronary artery disease)   CKD (chronic kidney disease) stage 3, GFR 30-59 ml/min (HCC)   Paroxysmal atrial fibrillation ([place])   Cholelithiasis   Nephrolithiasis      * No surgery found [facility] Stay = 1 days  Assessment  Paroxysmal A. Fib - followed by Dr. [name], hold eliquis , patient will need cardiac clearance HTN CAD - remote hx PCI to LAD CKD stage III Nephrolithiasis Pancreatic cyst  Plan: Cholelithiasis with concern for acute on chronic cholecystitis - [place] negative for overt cholecystitis, but symptomatically sounds like at least chronic cholecystitis w biliary colic.  Most likely would benefit from lap chole if cleared by medicine & cardiology.  Needs to off Eliquis x 48 hours = 7/5 Friday at soonest for surgery.  Awaiting cardiology consult per primary medicine service.  If very high risk, may need perc chole if worsens.  She is rather active & tolerated cystoscopy/stent for stones last year, so probably OK.  We will tentatively book for tomorrow in the hopes that she will be cleared.  Hold heparin after 2 AM tonight event she can go to surgery at 8 in the morning.  If no surgery, can restart.  The anatomy & physiology of hepatobiliary & pancreatic function was discussed.  The pathophysiology of gallbladder dysfunction was discussed.  Natural history risks without surgery was discussed.   I feel the risks of no intervention  will lead to serious problems that outweigh the operative risks; therefore, I recommended cholecystectomy to remove the pathology.  I explained laparoscopic techniques with possible need for an open approach.  Probable cholangiogram to evaluate the bilary tract was explained as well.    Risks such as bleeding, infection, abscess, leak, injury to other organs, need for repair of tissues / organs, need for further treatment, stroke, heart attack, death, and other risks were discussed.  I noted a good likelihood this will help address the problem.  Possibility that this will not correct all abdominal symptoms was explained.  Goals of post-operative recovery were discussed as well.  We will work to minimize complications.  An educational handout further explaining the pathology and treatment options was given as well.  Questions were answered.  The patient expresses understanding & wishes to proceed with surgery.     -FEN: ok to have clears as tolerated from our standpoint.  NPO after  -VTE: SCDs, ok with full dose lovenox or heparin while awaiting surgical clearance -ID: no current abx    20 minutes spent in review, evaluation, examination, counseling, and coordination of care.  More than 50% of that time was spent in counseling.  12/05/2017    Subjective: (Chief complaint)  Less pain today but still chronic soreness in right upper quadrant radiating to back.  Tolerating clears but gives classic story of pain with solid food.  On phone talking to family.  Pain less overall.  No major events.  Objective:  Vital signs:  Vitals:  12/04/17 0616 12/04/17 1346 12/04/17 2237 12/05/17 0402  BP: (!) 186/109 (!) 143/87 131/66 (!) 152/98  Pulse: 77 70 69 63  Resp: '18 18 18 18  '$ Temp: 97.6 F (36.4 C) 97.9 F (36.6 C) 98.8 F (37.1 C) 97.7 F (36.5 C)  TempSrc:  Oral Oral Oral  SpO2: 97% 94% 93% 96%  Weight: 74 kg (163 lb 2.3 oz)     Height:           Intake/Output    Yesterday:  07/03 0701 - 07/04 0700 In: 430.1 [I.V.:430.1] Out: -  This shift:  No intake/output data recorded.  Bowel function:  Flatus: YES  BM:  YES  Drain: (No drain)   Physical Exam:  General: Pt awake/alert/oriented x4 in no acute distress Eyes: PERRL, normal EOM.  Sclera clear.  No icterus Neuro: CN II-XII intact w/o focal sensory/motor deficits. Lymph: No head/neck/groin lymphadenopathy Psych:  No delerium/psychosis/paranoia HENT: Normocephalic, Mucus membranes moist.  No thrush Neck: Supple, No tracheal deviation Chest: No chest wall pain w good excursion CV:  Pulses intact.  Regular rhythm MS: Normal AROM mjr joints.  No obvious deformity  Abdomen: Soft.  Nondistended.  Tenderness at RUQ to back - mild.  No Murphy's.  No evidence of peritonitis.  No incarcerated hernias.  Ext:  No deformity.  No mjr edema.  No cyanosis Skin: No petechiae / purpura  Results:   Labs: Results for orders placed or performed during the hospital encounter of 12/04/17 (from the past 48 hour(s))  Comprehensive metabolic panel     Status: Abnormal   Collection Time: 12/04/17  2:19 AM  Result Value Ref Range   Sodium 142 135 - 145 mmol/L   Potassium 4.1 3.5 - 5.1 mmol/L   Chloride 107 98 - 111 mmol/L    Comment: Please note change in reference range.   CO2 26 22 - 32 mmol/L   Glucose, Bld 116 (H) 70 - 99 mg/dL    Comment: Please note change in reference range.   BUN 16 8 - 23 mg/dL    Comment: Please note change in reference range.   Creatinine, Ser 0.89 0.44 - 1.00 mg/dL   Calcium 9.5 8.9 - 10.3 mg/dL   Total Protein 7.0 6.5 - 8.1 g/dL   Albumin 3.9 3.5 - 5.0 g/dL   AST 25 15 - 41 U/L   ALT 15 0 - 44 U/L    Comment: Please note change in reference range.   Alkaline Phosphatase 135 (H) 38 - 126 U/L   Total Bilirubin 0.6 0.3 - 1.2 mg/dL   GFR calc non Af Amer 54 (L) >60 mL/min   GFR calc Af Amer >60 >60 mL/min    Comment: (NOTE) The eGFR has been calculated using the CKD  EPI equation. This calculation has not been validated in all clinical situations. eGFR's persistently <60 mL/min signify possible Chronic Kidney Disease.    Anion gap 9 5 - 15    Comment: Performed at [facility], [place] [address], [place], [place] [address]  CBC with Differential     Status: Abnormal   Collection Time: 12/04/17  2:19 AM  Result Value Ref Range   WBC 5.7 4.0 - 10.5 K/uL   RBC 4.50 3.87 - 5.11 MIL/uL   Hemoglobin 12.3 12.0 - 15.0 g/dL   HCT 39.7 36.0 - 46.0 %   MCV 88.2 78.0 - 100.0 fL   MCH 27.3 26.0 - 34.0 pg   MCHC 31.0 30.0 -  36.0 g/dL   RDW 17.6 (H) 11.5 - 15.5 %   Platelets 208 150 - 400 K/uL   Neutrophils Relative % 57 %   Neutro Abs 3.2 1.7 - 7.7 K/uL   Lymphocytes Relative 29 %   Lymphs Abs 1.6 0.7 - 4.0 K/uL   Monocytes Relative 11 %   Monocytes Absolute 0.6 0.1 - 1.0 K/uL   Eosinophils Relative 3 %   Eosinophils Absolute 0.2 0.0 - 0.7 K/uL   Basophils Relative 0 %   Basophils Absolute 0.0 0.0 - 0.1 K/uL    Comment: Performed at [facility], [place] [address], [place], [place] [address]  Lipase, blood     Status: None   Collection Time: 12/04/17  2:19 AM  Result Value Ref Range   Lipase 28 11 - 51 U/L    Comment: Performed at [facility], [place] [address], [place], [place] [address]  Protime-INR     Status: Abnormal   Collection Time: 12/04/17  3:41 AM  Result Value Ref Range   Prothrombin Time 15.7 (H) 11.4 - 15.2 seconds   INR 1.26     Comment: Performed at [facility], [place] [address] [place], [place] [address]  APTT     Status: None   Collection Time: 12/04/17 11:52 AM  Result Value Ref Range   aPTT 31 24 - 36 seconds    Comment: Performed at [facility], [place] [address] [place], [place] [address]  Protime-INR     Status: None   Collection Time: 12/04/17 11:52 AM  Result Value Ref Range   Prothrombin Time 14.6 11.4 - 15.2 seconds   INR 1.15      Comment: Performed at [facility], [place] [address], [place], [place] [address]  Heparin level (unfractionated)     Status: Abnormal   Collection Time: 12/04/17 11:52 AM  Result Value Ref Range   Heparin Unfractionated >2.20 (H) 0.30 - 0.70 IU/mL    Comment: RESULTS CONFIRMED BY MANUAL DILUTION (NOTE) If heparin results are below expected values, and patient dosage has  been confirmed, suggest follow up testing of antithrombin III levels. Performed at [facility], [place] [address], [place], [place] [address]   APTT     Status: Abnormal   Collection Time: 12/04/17  7:37 PM  Result Value Ref Range   aPTT 65 (H) 24 - 36 seconds    Comment:        IF BASELINE aPTT IS ELEVATED, SUGGEST PATIENT RISK ASSESSMENT BE USED TO DETERMINE APPROPRIATE ANTICOAGULANT THERAPY. Performed at [facility], [place] [address], [place],  [address]   Comprehensive metabolic panel     Status: Abnormal   Collection Time: 12/05/17  4:20 AM  Result Value Ref Range   Sodium 140 135 - 145 mmol/L   Potassium 4.0 3.5 - 5.1 mmol/L   Chloride 106 98 - 111 mmol/L    Comment: Please note change in reference range.   CO2 26 22 - 32 mmol/L   Glucose, Bld 95 70 - 99 mg/dL    Comment: Please note change in reference range.   BUN 11 8 - 23 mg/dL    Comment: Please note change in reference range.   Creatinine, Ser 0.77 0.44 - 1.00 mg/dL   Calcium 9.5 8.9 - 10.3 mg/dL   Total Protein 6.1 (L) 6.5 - 8.1 g/dL   Albumin 3.3 (L) 3.5 - 5.0 g/dL   AST 21  15 - 41 U/L   ALT 14 0 - 44 U/L    Comment: Please note change in reference range.   Alkaline Phosphatase 117 38 - 126 U/L   Total Bilirubin 1.0 0.3 - 1.2 mg/dL   GFR calc non Af Amer >60 >60 mL/min   GFR calc Af Amer >60 >60 mL/min    Comment: (NOTE) The eGFR has been calculated using the CKD EPI equation. This calculation has not been validated in all clinical situations. eGFR's persistently <60 mL/min  signify possible Chronic Kidney Disease.    Anion gap 8 5 - 15    Comment: Performed at [facility], [place] [address], [place], [place] [address]  APTT     Status: Abnormal   Collection Time: 12/05/17  4:20 AM  Result Value Ref Range   aPTT 70 (H) 24 - 36 seconds    Comment:        IF BASELINE aPTT IS ELEVATED, SUGGEST PATIENT RISK ASSESSMENT BE USED TO DETERMINE APPROPRIATE ANTICOAGULANT THERAPY. Performed at [facility], [place] [address] [place], [place] [address]   Heparin level (unfractionated)     Status: Abnormal   Collection Time: 12/05/17  4:20 AM  Result Value Ref Range   Heparin Unfractionated 1.14 (H) 0.30 - 0.70 IU/mL    Comment: RESULTS CONFIRMED BY MANUAL DILUTION (NOTE) If heparin results are below expected values, and patient dosage has  been confirmed, suggest follow up testing of antithrombin III levels. Performed at [facility], [place] [address] [place], [place] [address]     Imaging / Studies: US Abdomen Complete  Result Date: 12/04/2017 CLINICAL DATA:  Right upper quadrant and flank pain EXAM: ABDOMEN ULTRASOUND COMPLETE COMPARISON:  CT 11/30/2017 FINDINGS: Gallbladder: Multiple shadowing stones. Normal wall thickness. No sonographic Percell Miller indicated. Common bile duct: Diameter: 7.3 mm Liver: No focal lesion identified. Within normal limits in parenchymal echogenicity. Portal vein is patent on color Doppler imaging with normal direction of blood flow towards the liver. IVC: No abnormality visualized. Pancreas: 1.9 x 1.2 cm cystic lesion at the head of the pancreas. Spleen: Size and appearance within normal limits. Right Kidney: Length: 11.3 cm. Cortical echogenicity within normal limits. No hydronephrosis. 1.2 cm cyst in the midpole. Left Kidney: Length: 10.3 cm. Echogenicity within normal limits. No mass or hydronephrosis visualized. Abdominal aorta: No aneurysm visualized. Other findings: None. IMPRESSION:  1. Multiple gallstones without definite evidence for acute cholecystitis. Slightly enlarged common bile duct up to 7.3 mm, recommend correlation with LFTs. 2. 1.9 cm cyst at the head of the pancreas 3. Small cyst in the right kidney Electronically Signed   By: Donavan Foil M.D.   On: 12/04/2017 03:38    Medications / Allergies: per chart  Antibiotics: Anti-infectives (From admission, onward)   None        Note: Portions of this report may have been transcribed using voice recognition software. Every effort was made to ensure accuracy; however, inadvertent computerized transcription errors may be present.   Any transcriptional errors that result from this process are unintentional.     Adin Hector, MD, FACS, MASCRS Gastrointestinal and Minimally Invasive Surgery    [address], [place], [place] [address] [phone] Main / Paging [phone] Fax

## 2017-12-05 NOTE — Progress Notes (Signed)
Patient converted to a-fib rhythm. Patient resting, will continue to monitor.

## 2017-12-05 NOTE — Progress Notes (Signed)
ANTICOAGULATION CONSULT NOTE - Follow Up Consult  Pharmacy Consult for Heparin Indication: atrial fibrillation  Allergies  Allergen Reactions  . Antihistamines, Chlorpheniramine-Type Other (See Comments)    Pt states that she passed out.   [name] Penicillins Anaphylaxis and Other (See Comments)    Has patient had a PCN reaction causing immediate rash, facial/tongue/throat swelling, SOB or lightheadedness with hypotension: Yes Has patient had a PCN reaction causing severe rash involving mucus membranes or skin necrosis: No Has patient had a PCN reaction that required hospitalization No Has patient had a PCN reaction occurring within the last 10 years: No If all of the above answers are "NO", then may proceed with Cephalosporin use.  . Contrast Media [Iodinated Diagnostic Agents] Hives  . Sulfonamide Derivatives Other (See Comments)    Reaction:  Unknown   . Atorvastatin Other (See Comments)    Reaction:  Unknown   . Pheniramine Other (See Comments)    Pt states that she passed out.     Patient Measurements: Height: 5' 5.5" (166.4 cm) Weight: 163 lb 2.3 oz (74 kg) IBW/kg (Calculated) : 58.15 Heparin Dosing Weight:   Vital Signs: Temp: 97.7 F (36.5 C) (07/04 0402) Temp Source: Oral (07/04 0402) BP: 152/98 (07/04 0402) Pulse Rate: 63 (07/04 0402)  Labs: Recent Labs    12/04/17 0219 12/04/17 0341 12/04/17 1152 12/04/17 1937 12/05/17 0420  HGB 12.3  --   --   --   --   HCT 39.7  --   --   --   --   PLT 208  --   --   --   --   APTT  --   --  31 65* 70*  LABPROT  --  15.7* 14.6  --   --   INR  --  1.26 1.15  --   --   HEPARINUNFRC  --   --  >2.20*  --   --   CREATININE 0.89  --   --   --  0.77    Estimated Creatinine Clearance: 44.7 mL/min (by C-G formula based on SCr of 0.77 mg/dL).   Medications:  Infusions:  . sodium chloride 50 mL/hr at 12/04/17 1410  . heparin 900 Units/hr (12/04/17 1235)    Assessment: Patient with PTT at goal but heparin level still not  reported yet.  Will use the PTT for rate changes at this time.  PTT ordered with Heparin level until both correlate due to possible drug-lab interaction between oral anticoagulant (rivaroxaban, edoxaban, or apixaban) and anti-Xa level (aka heparin level)   Goal of Therapy:  Heparin level 0.3-0.7 units/ml aPTT 66-102 seconds Monitor platelets by anticoagulation protocol: Yes   Plan:  Continue heparin drip at current rate Recheck level PTT/heparin level at [place], [place] 12/05/2017,6:17 AM

## 2017-12-05 NOTE — Progress Notes (Signed)
PROGRESS NOTE    Shelly Obrien  ONG:[id number] DOB: 03-02-1923 DOA: 12/04/2017 PCP: [name], MD   Brief Narrative:  Shelly Obrien a 84 y.o.femalewith medical history significant ofHTN, HLD, PAF s/pcardioversionon Elquis, CAD s/p stents,CKD, and nephrolithiasis; who presents withcomplaints of right flank and abdominal  pain over the week. She was found to have acute cholecystitis. Surgery consulted and she is scheduled to have surgery on Friday , 48 hours for her eliquis to wash out of her system   Assessment & Plan:   Principal Problem:   Abdominal pain Active Problems:   Essential hypertension   CAD (coronary artery disease)   CKD (chronic kidney disease) stage 3, GFR 30-59 ml/min (HCC)   Paroxysmal atrial fibrillation (HCC)   Cholelithiasis   Nephrolithiasis   Right upper quadrant pain secondary to acute cholecystitis: Surgery consulted and recommendations given. Plan for cholecystectomy tomorrow.   Based off the geriatric sensitive perioperative cardiac risk assessment patient probability of perioperative myocardial infarction or cardiac arrest is 0.5-1.3%.  Cardiology consulted for pre op clearance and to see if she needs additional testing prior to the procedure.  Pain control and gentle hydration.  ALK PHOS normal. Liver function panel is normal.    Paroxysmal atrial fib with good rate control.  On cardizem and on IV heparin for anti coagulation.    Hypertension  Sub optimal. Prn hydralazine.    CAD with stent to LAD in the past.  Currently denies any chest pain or sob.    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     DVT prophylaxis:heparin.  Code Status: full code.  Family Communication: spoke briefly with POA at bedside on 7/3 Disposition Plan: pending surgery.   Consultants:   Surgery  Cardiology for pre op clearance.    Procedures: none.   Antimicrobials:none.    Subjective: abd pain is controlled with  pain meds.  Slightly nauseated.   Objective: Vitals:   12/04/17 0616 12/04/17 1346 12/04/17 2237 12/05/17 0402  BP: (!) 186/109 (!) 143/87 131/66 (!) 152/98  Pulse: 77 70 69 63  Resp: '18 18 18 18  '$ Temp: 97.6 F (36.4 C) 97.9 F (36.6 C) 98.8 F (37.1 C) 97.7 F (36.5 C)  TempSrc:  Oral Oral Oral  SpO2: 97% 94% 93% 96%  Weight: 74 kg (163 lb 2.3 oz)     Height:        Intake/Output Summary (Last 24 hours) at 12/05/2017 1311 Last data filed at 12/05/2017 0854 Gross per 24 hour  Intake 1730.08 ml  Output -  Net 1730.08 ml   Filed Weights   12/04/17 0126 12/04/17 0616  Weight: 72.6 kg (160 lb) 74 kg (163 lb 2.3 oz)    Examination:  General exam: Appears calm and comfortable  Respiratory system: Clear to auscultation. Respiratory effort normal. Cardiovascular system: S1 & S2 heard, irregular.  Gastrointestinal system: Abdomen is nondistended, soft and nontender. No organomegaly or masses felt. Normal bowel sounds heard. Central nervous system: Alert and oriented. No focal neurological deficits. Extremities: Symmetric 5 x 5 power. Skin: No rashes, lesions or ulcers Psychiatry: . Mood & affect appropriate.     Data Reviewed: I have personally reviewed following labs and imaging studies  CBC: Recent Labs  Lab 11/30/17 0843 12/04/17 0219  WBC 5.7 5.7  NEUTROABS  --  3.2  HGB 12.1 12.3  HCT 40.5 39.7  MCV 89.0 88.2  PLT 218 440   Basic Metabolic Panel: Recent Labs  Lab 11/30/17 0843 12/04/17 0219 12/05/17 0420  NA 141 142 140  K 3.9 4.1 4.0  CL 106 107 106  CO2 '22 26 26  '$ GLUCOSE 151* 116* 95  BUN '16 16 11  '$ CREATININE 1.00 0.89 0.77  CALCIUM 9.4 9.5 9.5   GFR: Estimated Creatinine Clearance: 44.7 mL/min (by C-G formula based on SCr of 0.77 mg/dL). Liver Function Tests: Recent Labs  Lab 11/30/17 0843 12/04/17 0219 12/05/17 0420  AST '23 25 21  '$ ALT '13 15 14  '$ ALKPHOS 127* 135* 117  BILITOT 0.8 0.6 1.0  PROT 6.5 7.0 6.1*  ALBUMIN 3.5 3.9 3.3*    Recent Labs  Lab 11/30/17 0843 12/04/17 0219  LIPASE 30 28   No results for input(s): AMMONIA in the last 168 hours. Coagulation Profile: Recent Labs  Lab 12/04/17 0341 12/04/17 1152  INR 1.26 1.15   Cardiac Enzymes: No results for input(s): CKTOTAL, CKMB, CKMBINDEX, TROPONINI in the last 168 hours. BNP (last 3 results) No results for input(s): PROBNP in the last 8760 hours. HbA1C: No results for input(s): HGBA1C in the last 72 hours. CBG: No results for input(s): GLUCAP in the last 168 hours. Lipid Profile: No results for input(s): CHOL, HDL, LDLCALC, TRIG, CHOLHDL, LDLDIRECT in the last 72 hours. Thyroid Function Tests: No results for input(s): TSH, T4TOTAL, FREET4, T3FREE, THYROIDAB in the last 72 hours. Anemia Panel: No results for input(s): VITAMINB12, FOLATE, FERRITIN, TIBC, IRON, RETICCTPCT in the last 72 hours. Sepsis Labs: No results for input(s): PROCALCITON, LATICACIDVEN in the last 168 hours.  No results found for this or any previous visit (from the past 240 hour(s)).       Radiology Studies: US Abdomen Complete  Result Date: 12/04/2017 CLINICAL DATA:  Right upper quadrant and flank pain EXAM: ABDOMEN ULTRASOUND COMPLETE COMPARISON:  CT 11/30/2017 FINDINGS: Gallbladder: Multiple shadowing stones. Normal wall thickness. No sonographic Percell Miller indicated. Common bile duct: Diameter: 7.3 mm Liver: No focal lesion identified. Within normal limits in parenchymal echogenicity. Portal vein is patent on color Doppler imaging with normal direction of blood flow towards the liver. IVC: No abnormality visualized. Pancreas: 1.9 x 1.2 cm cystic lesion at the head of the pancreas. Spleen: Size and appearance within normal limits. Right Kidney: Length: 11.3 cm. Cortical echogenicity within normal limits. No hydronephrosis. 1.2 cm cyst in the midpole. Left Kidney: Length: 10.3 cm. Echogenicity within normal limits. No mass or hydronephrosis visualized. Abdominal aorta: No  aneurysm visualized. Other findings: None. IMPRESSION: 1. Multiple gallstones without definite evidence for acute cholecystitis. Slightly enlarged common bile duct up to 7.3 mm, recommend correlation with LFTs. 2. 1.9 cm cyst at the head of the pancreas 3. Small cyst in the right kidney Electronically Signed   By: Donavan Foil M.D.   On: 12/04/2017 03:38        Scheduled Meds: . [START ON 12/06/2017] acetaminophen  1,000 mg Oral On Call to OR  . Chlorhexidine Gluconate Cloth  6 each Topical Once   And  . [START ON 12/06/2017] Chlorhexidine Gluconate Cloth  6 each Topical Once  . diltiazem  120 mg Oral BID  . [START ON 12/06/2017] gabapentin  300 mg Oral On Call to OR  . pantoprazole  20 mg Oral Daily  . sodium chloride flush  3 mL Intravenous Q12H   Continuous Infusions: . sodium chloride 50 mL/hr at 12/04/17 1410  . [START ON 12/06/2017] clindamycin (CLEOCIN) IV     And  . [START ON 12/06/2017] gentamicin    .  heparin 900 Units/hr (12/05/17 1218)     LOS: 1 day    Time spent: 7 min    Hosie Poisson, MD [facility] Pager [phone]   If 7PM-7AM, please contact night-coverage [email or web address] Password [email or web address] 12/05/2017, 1:11 PM

## 2017-12-05 NOTE — Consult Note (Signed)
Cardiology Consultation:   Patient ID: Shelly Obrien; [id number]; 05/02/1923   Admit date: 12/04/2017 Date of Consult: 12/05/2017  Primary Care Provider: [name], MD Primary Cardiologist: Shelly. Dorris Obrien   Patient Profile:   Shelly Obrien is a 84 y.o. female with a history of persistent atrial fibrillation (cardioversion 07/2017) on Eliquis, CAD s/p PCI to LAD 1994, CKD stage 3, hypertension, and hyperlipidemia who is being seen today for preoperative cardiac evaluation of at the request of Shelly Obrien.  History of Present Illness:   Shelly Obrien currently admitted to the hospital with recent onset right upper quadrant discomfort and diagnosis of chronic cholecystitis with biliary colic.  She is being considered for laparoscopic cholecystectomy, potentially tomorrow.  Eliquis has been held since admission and she is currently on IV heparin.  From a cardiac perspective she does not report any angina symptoms and at baseline has NYHA class II dyspnea with typical ADLs.  She lives at home and has assistance from family members.  She states that she takes care of herself, does all of her cooking and cleaning, still drives and goes shopping.  She is limited by right knee pain when she walks.  She does not report any palpitations specifically when she has atrial fibrillation, more of an increased sense of breathlessness.  She did undergo cardioversion in February of this year and has been on Eliquis and Cardizem CD.  [name], she went into atrial fibrillation from sinus rhythm earlier this morning.  Based on RCRI risk calculator, patient has overall perioperative risk of major cardiac event at 0.9%, class II.  Past Medical History:  Diagnosis Date  . CAD S/P percutaneous coronary angioplasty    hx MI 04/ 1994  s/p  PCI to LAD  . CKD (chronic kidney disease), stage III ([place])   . Complication of anesthesia    hard to wake   . History of basal cell carcinoma (BCC) excision    right eye area 08/  2013  . History of kidney stones 01/20/2017  . History of MI (myocardial infarction) 09/1992   s/p  PCI to LAD  . History of squamous cell carcinoma excision    right lower leg 2015 ;  2016  . Hyperlipidemia   . Hypertension   . Macular degeneration   . PAF (paroxysmal atrial fibrillation) ([place]) dx 02-24-2016   followed by cardiologist-- Shelly Obrien  . PONV (postoperative nausea and vomiting)   . Right ureteral stone   . Wears glasses     Past Surgical History:  Procedure Laterality Date  . ABDOMINAL HYSTERECTOMY    . BREAST EXCISIONAL BIOPSY Left 11/02/2009   fibroadenoma (lumpectomy)  . BROW LIFT Right 04/07/2014   Procedure: REPAIR OF ENTROPION AND TRICHIASIS OF RIGHT LOWER EYE LID ;  Surgeon: Shelly Obrien;  Location: [place];  Service: Plastics;  Laterality: Right;  . CARDIOVERSION N/A 07/18/2017   Procedure: CARDIOVERSION;  Surgeon: Shelly Latch, MD;  Location: [facility];  Service: Cardiovascular;  Laterality: N/A;  . CATARACT EXTRACTION W/ INTRAOCULAR LENS  IMPLANT, BILATERAL    . CORONARY ANGIOPLASTY  04/ 1994  Shelly Shelly Obrien   PCI to LAD  . CYSTOSCOPY W/ URETERAL STENT PLACEMENT Right 01/20/2017   Procedure: CYSTOSCOPY WITH RIGHT RETROGRADE PYELOGRAM/RIGHT URETERAL STENT PLACEMENT;  Surgeon: Shelly Frock, MD;  Location: [facility];  Service: Urology;  Laterality: Right;  . CYSTOSCOPY WITH RETROGRADE PYELOGRAM, URETEROSCOPY AND STENT PLACEMENT Right 02/15/2017   Procedure: CYSTOSCOPY WITH RETROGRADE  PYELOGRAM, URETEROSCOPY, STONE BASKETRY AND STENT REPLACEMENT;  Surgeon: Shelly Frock, MD;  Location: [facility];  Service: Urology;  Laterality: Right;  . CYSTOSCOPY/RETROGRADE/URETEROSCOPY/STONE EXTRACTION WITH BASKET  yrs ago  . DILATION AND CURETTAGE OF UTERUS    . HOLMIUM LASER APPLICATION Right 2/40/9735   Procedure: HOLMIUM LASER APPLICATION;  Surgeon: Shelly Frock, MD;  Location: [facility];  Service: Urology;   Laterality: Right;  . KNEE SURGERY     right  . NEUROPLASTY / TRANSPOSITION ULNAR NERVE AT ELBOW Left 02-23-2000    [facility]   and Left Carpal Tunnel Release  . TRANSTHORACIC ECHOCARDIOGRAM  02/26/2016   moderate focal basal and mild concentric LVH,  ef 55-60%,  akinesis of the basal-midanteroseptal, inferoseptal and apicalinferior myocardium, study not sufficient to evaluation diastolic funciton due to atrial fib./  trivial PR/  mild TR     Inpatient Medications: Scheduled Meds: . [START ON 12/06/2017] acetaminophen  1,000 mg Oral On Call to OR  . Chlorhexidine Gluconate Cloth  6 each Topical Once   And  . [START ON 12/06/2017] Chlorhexidine Gluconate Cloth  6 each Topical Once  . diltiazem  120 mg Oral BID  . [START ON 12/06/2017] gabapentin  300 mg Oral On Call to OR  . pantoprazole  20 mg Oral Daily  . sodium chloride flush  3 mL Intravenous Q12H   Continuous Infusions: . sodium chloride 50 mL/hr at 12/04/17 1410  . [START ON 12/06/2017] clindamycin (CLEOCIN) IV     And  . [START ON 12/06/2017] gentamicin    . heparin 900 Units/hr (12/05/17 1218)   PRN Meds: acetaminophen **OR** acetaminophen, albuterol, hydrALAZINE, morphine injection, ondansetron **OR** ondansetron (ZOFRAN) IV, polyvinyl alcohol  Allergies:    Allergies  Allergen Reactions  . Antihistamines, Chlorpheniramine-Type Other (See Comments)    Pt states that she passed out.   [name] Penicillins Anaphylaxis and Other (See Comments)    Has patient had a PCN reaction causing immediate rash, facial/tongue/throat swelling, SOB or lightheadedness with hypotension: Yes Has patient had a PCN reaction causing severe rash involving mucus membranes or skin necrosis: No Has patient had a PCN reaction that required hospitalization No Has patient had a PCN reaction occurring within the last 10 years: No If all of the above answers are "NO", then may proceed with Cephalosporin use.  . Contrast Media [Iodinated Diagnostic Agents] Hives  .  Sulfonamide Derivatives Other (See Comments)    Reaction:  Unknown   . Atorvastatin Other (See Comments)    Reaction:  Unknown   . Pheniramine Other (See Comments)    Pt states that she passed out.     Social History:   Social History   Socioeconomic History  . Marital status: Widowed    Spouse name: Not on file  . Number of children: 2  . Years of education: Not on file  . Highest education level: Not on file  Occupational History  . Occupation: Retired  Scientific laboratory technician  . Financial resource strain: Not on file  . Food insecurity:    Worry: Not on file    Inability: Not on file  . Transportation needs:    Medical: Not on file    Non-medical: Not on file  Tobacco Use  . Smoking status: Never Smoker  . Smokeless tobacco: Never Used  Substance and Sexual Activity  . Alcohol use: No    Alcohol/week: 0.0 oz  . Drug use: No  . Sexual activity: Not on file  Lifestyle  .  Physical activity:    Days per week: Not on file    Minutes per session: Not on file  . Stress: Not on file  Relationships  . Social connections:    Talks on phone: Not on file    Gets together: Not on file    Attends religious service: Not on file    Active member of club or organization: Not on file    Attends meetings of clubs or organizations: Not on file    Relationship status: Not on file  . Intimate partner violence:    Fear of current or ex partner: Not on file    Emotionally abused: Not on file    Physically abused: Not on file    Forced sexual activity: Not on file  Other Topics Concern  . Not on file  Social History Narrative   Lives at home    Family History:   The patient's family history includes Brain cancer in her son; Cancer in her unknown relative; Coronary artery disease in her unknown relative; Liver cancer in her daughter.  ROS:  Please see the history of present illness.  Hearing loss, chronic right knee pain.  All other ROS reviewed and negative.     Physical Exam/Data:    Vitals:   12/04/17 0616 12/04/17 1346 12/04/17 2237 12/05/17 0402  BP: (!) 186/109 (!) 143/87 131/66 (!) 152/98  Pulse: 77 70 69 63  Resp: 18 18 18 18   Temp: 97.6 F (36.4 C) 97.9 F (36.6 C) 98.8 F (37.1 C) 97.7 F (36.5 C)  TempSrc:  Oral Oral Oral  SpO2: 97% 94% 93% 96%  Weight: 163 lb 2.3 oz (74 kg)     Height:        Intake/Output Summary (Last 24 hours) at 12/05/2017 1321 Last data filed at 12/05/2017 0854 Gross per 24 hour  Intake 1730.08 ml  Output -  Net 1730.08 ml   Filed Weights   12/04/17 0126 12/04/17 0616  Weight: 160 lb (72.6 kg) 163 lb 2.3 oz (74 kg)   Body mass index is 26.74 kg/m.   Gen: Elderly woman, appears comfortable at rest. HEENT: Conjunctiva and lids normal, oropharynx clear. Neck: Supple, no elevated JVP or carotid bruits, no thyromegaly. Lungs: Clear to auscultation, nonlabored breathing at rest. Cardiac: Irregularly irregular, no S3, soft systolic murmur, no pericardial rub.  Abdomen: Soft, mildly tender without guarding, bowel sounds present. Extremities: Trace ankle edema, distal pulses 2+. Skin: Warm and dry. Musculoskeletal: No kyphosis. Neuropsychiatric: Alert and oriented x3, affect grossly appropriate.  EKG:  I personally reviewed the tracing from 12/04/2017 which showed sinus rhythm with left anterior fascicular block.  Telemetry:  I personally reviewed telemetry which shows atrial fibrillation.  Relevant CV Studies:  Echocardiogram 02/26/2016: Study Conclusions  - Left ventricle: The cavity size was normal. There was moderate   focal basal and mild concentric hypertrophy. Systolic function   was normal. The estimated ejection fraction was in the range of   55% to 60%. There is akinesis of the basal-midanteroseptal and   inferoseptal myocardium. There is akinesis of the apicalinferior   myocardium.  Laboratory Data:  Chemistry Recent Labs  Lab 11/30/17 0843 12/04/17 0219 12/05/17 0420  NA 141 142 140  K 3.9 4.1 4.0   CL 106 107 106  CO2 22 26 26   GLUCOSE 151* 116* 95  BUN 16 16 11   CREATININE 1.00 0.89 0.77  CALCIUM 9.4 9.5 9.5  GFRNONAA 47* 54* >60  GFRAA 55* >60 >60  ANIONGAP 13 9 8     Recent Labs  Lab 11/30/17 0843 12/04/17 0219 12/05/17 0420  PROT 6.5 7.0 6.1*  ALBUMIN 3.5 3.9 3.3*  AST 23 25 21   ALT 13 15 14   ALKPHOS 127* 135* 117  BILITOT 0.8 0.6 1.0   Hematology Recent Labs  Lab 11/30/17 0843 12/04/17 0219  WBC 5.7 5.7  RBC 4.55 4.50  HGB 12.1 12.3  HCT 40.5 39.7  MCV 89.0 88.2  MCH 26.6 27.3  MCHC 29.9* 31.0  RDW 17.5* 17.6*  PLT 218 208   Cardiac EnzymesNo results for input(s): TROPONINI in the last 168 hours.  Recent Labs  Lab 11/30/17 0852  TROPIPOC 0.00     Radiology/Studies:  US Abdomen Complete  Result Date: 12/04/2017 CLINICAL DATA:  Right upper quadrant and flank pain EXAM: ABDOMEN ULTRASOUND COMPLETE COMPARISON:  CT 11/30/2017 FINDINGS: Gallbladder: Multiple shadowing stones. Normal wall thickness. No sonographic Percell Miller indicated. Common bile duct: Diameter: 7.3 mm Liver: No focal lesion identified. Within normal limits in parenchymal echogenicity. Portal vein is patent on color Doppler imaging with normal direction of blood flow towards the liver. IVC: No abnormality visualized. Pancreas: 1.9 x 1.2 cm cystic lesion at the head of the pancreas. Spleen: Size and appearance within normal limits. Right Kidney: Length: 11.3 cm. Cortical echogenicity within normal limits. No hydronephrosis. 1.2 cm cyst in the midpole. Left Kidney: Length: 10.3 cm. Echogenicity within normal limits. No mass or hydronephrosis visualized. Abdominal aorta: No aneurysm visualized. Other findings: None. IMPRESSION: 1. Multiple gallstones without definite evidence for acute cholecystitis. Slightly enlarged common bile duct up to 7.3 mm, recommend correlation with LFTs. 2. 1.9 cm cyst at the head of the pancreas 3. Small cyst in the right kidney Electronically Signed   By: Donavan Foil M.D.    On: 12/04/2017 03:38    Assessment and Plan:   1.  Preoperative evaluation in a 84 year old woman with remote history of CAD and infarct status post angioplasty to the LAD in 1994, no active angina symptoms with activities meeting 4 METS based on description, persistent atrial fibrillation with history of cardioversion in February of this year and maintained on Cardizem CD as well as Eliquis.  She is being considered for laparoscopic cholecystectomy.  RCRI risk calculator predicts perioperative major cardiac palpitation risk of only 0.9%.  2.  Persistent atrial fibrillation, she had a cardioversion in February of this year and has maintained sinus rhythm presumably until earlier this morning when she returned to atrial fibrillation.  Eliquis has been held with plan for 48-hour window prior to surgery.  She is currently on IV heparin and has been placed back on Cardizem CD 120 mg twice daily which was her outpatient dose.  3.  CKD stage III, current creatinine is 0.77 with normal GFR.  4.  Essential hypertension.  5.  Mixed hyperlipidemia.  No further cardiac testing is planned at this time preoperatively.  She should be able to proceed with planned operation at an acceptable perioperative cardiac risk as outlined above.  Would continue telemetry monitoring throughout as she may need further medication adjustments for control of atrial fibrillation.  If she becomes n.p.o. for any extended period of time, will have to convert to IV rate control strategy.  Follow-up ECG tomorrow.  Signed, Rozann Lesches, MD  12/05/2017 1:21 PM

## 2017-12-05 NOTE — Progress Notes (Signed)
[place] for IV heparin Indication: atrial fibrillation  Allergies  Allergen Reactions  . Antihistamines, Chlorpheniramine-Type Other (See Comments)    Pt states that she passed out.   [name] Penicillins Anaphylaxis and Other (See Comments)    Has patient had a PCN reaction causing immediate rash, facial/tongue/throat swelling, SOB or lightheadedness with hypotension: Yes Has patient had a PCN reaction causing severe rash involving mucus membranes or skin necrosis: No Has patient had a PCN reaction that required hospitalization No Has patient had a PCN reaction occurring within the last 10 years: No If all of the above answers are "NO", then may proceed with Cephalosporin use.  . Contrast Media [Iodinated Diagnostic Agents] Hives  . Sulfonamide Derivatives Other (See Comments)    Reaction:  Unknown   . Atorvastatin Other (See Comments)    Reaction:  Unknown   . Pheniramine Other (See Comments)    Pt states that she passed out.    Patient Measurements: Height: 5' 5.5" (166.4 cm) Weight: 163 lb 2.3 oz (74 kg) IBW/kg (Calculated) : 58.15 Heparin Dosing Weight: 73 kg  Vital Signs: Temp: 97.7 F (36.5 C) (07/04 0402) Temp Source: Oral (07/04 0402) BP: 152/98 (07/04 0402) Pulse Rate: 63 (07/04 0402)  Labs: Recent Labs    12/04/17 0219 12/04/17 0341  12/04/17 1152 12/04/17 1937 12/05/17 0420 12/05/17 1152  HGB 12.3  --   --   --   --   --   --   HCT 39.7  --   --   --   --   --   --   PLT 208  --   --   --   --   --   --   APTT  --   --    < > 31 65* 70* 71*  LABPROT  --  15.7*  --  14.6  --   --   --   INR  --  1.26  --  1.15  --   --   --   HEPARINUNFRC  --   --   --  >2.20*  --  1.14* 1.10*  CREATININE 0.89  --   --   --   --  0.77  --    < > = values in this interval not displayed.   Estimated Creatinine Clearance: 44.7 mL/min (by C-G formula based on SCr of 0.77 mg/dL).  Medications:  Scheduled:  . [START ON 12/06/2017]  acetaminophen  1,000 mg Oral On Call to OR  . Chlorhexidine Gluconate Cloth  6 each Topical Once   And  . [START ON 12/06/2017] Chlorhexidine Gluconate Cloth  6 each Topical Once  . diltiazem  120 mg Oral BID  . [START ON 12/06/2017] gabapentin  300 mg Oral On Call to OR  . pantoprazole  20 mg Oral Daily  . sodium chloride flush  3 mL Intravenous Q12H   PRN: acetaminophen **OR** acetaminophen, albuterol, hydrALAZINE, morphine injection, ondansetron **OR** ondansetron (ZOFRAN) IV, polyvinyl alcohol  Assessment: 13 yoF with PMH Afib on Eliquis, HTN, HLD, CAD s/p PCI, CKD, nephrolithiasis, admitted 7/3 for cholelithiasis with concern for acute cholecystitis. Eliquis held for procedure, likely on 7/5. Pharmacy to dose heparin in the interim.  Prior anticoagulation: Eliquis 5 mg PO bid, LD 7/2 PM  DOACs will increase Heparin levels, need to monitor with aPTT  Today, 12/05/2017:  APTT 71, therapeutic  No s/s of bleeding   Goal of Therapy: Heparin level 0.3-0.7 units/ml APTT  66-102 sec Monitor platelets by anticoagulation protocol: Yes  Plan:  Continue Heparin at 900 units/hr  Heparin to be held at 0200 on 7/5 for surgery  Daily CBC  F/u resuming anti-coagulation  Dolly Rias RPh 12/05/2017, 12:38 PM Pager [phone]

## 2017-12-06 ENCOUNTER — Encounter (HOSPITAL_COMMUNITY)
Admission: EM | Disposition: A | Discharge status: Home / Self Care | Payer: Self-pay | Source: Home / Self Care | Attending: Internal Medicine

## 2017-12-06 ENCOUNTER — Encounter (HOSPITAL_COMMUNITY): Payer: Self-pay

## 2017-12-06 ENCOUNTER — Inpatient Hospital Stay (HOSPITAL_COMMUNITY): Payer: Medicare Other | Admitting: Anesthesiology

## 2017-12-06 HISTORY — PX: CHOLECYSTECTOMY: SHX55

## 2017-12-06 LAB — CBC
HCT: 38.7 % (ref 36.0–46.0)
Hemoglobin: 11.9 g/dL — ABNORMAL LOW (ref 12.0–15.0)
MCH: 27 pg (ref 26.0–34.0)
MCHC: 30.7 g/dL (ref 30.0–36.0)
MCV: 87.8 fL (ref 78.0–100.0)
Platelets: 201 10*3/uL (ref 150–400)
RBC: 4.41 MIL/uL (ref 3.87–5.11)
RDW: 17.7 % — ABNORMAL HIGH (ref 11.5–15.5)
WBC: 4.1 10*3/uL (ref 4.0–10.5)

## 2017-12-06 LAB — SURGICAL PCR SCREEN
MRSA, PCR: NEGATIVE
Staphylococcus aureus: NEGATIVE

## 2017-12-06 SURGERY — LAPAROSCOPIC CHOLECYSTECTOMY
Anesthesia: General

## 2017-12-06 MED ORDER — MORPHINE SULFATE (PF) 4 MG/ML IV SOLN: 1.0000 mg | INTRAVENOUS | Status: DC | PRN

## 2017-12-06 MED ORDER — PROPOFOL 10 MG/ML IV BOLUS
INTRAVENOUS | Status: AC
  Filled 2017-12-06: qty 20

## 2017-12-06 MED ORDER — GENTAMICIN SULFATE 40 MG/ML IJ SOLN
INTRAVENOUS | Status: DC | PRN
  Administered 2017-12-06: 370 mg via INTRAVENOUS

## 2017-12-06 MED ORDER — BUPIVACAINE-EPINEPHRINE (PF) 0.25% -1:200000 IJ SOLN
INTRAMUSCULAR | Status: AC
  Filled 2017-12-06: qty 30

## 2017-12-06 MED ORDER — HEPARIN (PORCINE) IN NACL 100-0.45 UNIT/ML-% IJ SOLN
900.0000 [IU]/h | INTRAMUSCULAR | Status: DC
  Administered 2017-12-07: 900 [IU]/h via INTRAVENOUS
  Filled 2017-12-06: qty 250

## 2017-12-06 MED ORDER — ROCURONIUM BROMIDE 100 MG/10ML IV SOLN
INTRAVENOUS | Status: AC
  Filled 2017-12-06: qty 1

## 2017-12-06 MED ORDER — DEXAMETHASONE SODIUM PHOSPHATE 10 MG/ML IJ SOLN
INTRAMUSCULAR | Status: AC
Start: 2017-12-06 — End: ?
  Filled 2017-12-06: qty 1

## 2017-12-06 MED ORDER — FENTANYL CITRATE (PF) 100 MCG/2ML IJ SOLN
INTRAMUSCULAR | Status: AC
  Filled 2017-12-06: qty 2

## 2017-12-06 MED ORDER — LIDOCAINE 2% (20 MG/ML) 5 ML SYRINGE
INTRAMUSCULAR | Status: DC | PRN
  Administered 2017-12-06: 60 mg via INTRAVENOUS

## 2017-12-06 MED ORDER — LACTATED RINGERS IR SOLN
Status: DC | PRN
  Administered 2017-12-06: 1000 mL

## 2017-12-06 MED ORDER — LIDOCAINE 2% (20 MG/ML) 5 ML SYRINGE
INTRAMUSCULAR | Status: AC
  Filled 2017-12-06: qty 5

## 2017-12-06 MED ORDER — ROCURONIUM BROMIDE 100 MG/10ML IV SOLN
INTRAVENOUS | Status: DC | PRN
  Administered 2017-12-06: 40 mg via INTRAVENOUS

## 2017-12-06 MED ORDER — IOPAMIDOL (ISOVUE-300) INJECTION 61%
INTRAVENOUS | Status: AC
  Filled 2017-12-06: qty 50

## 2017-12-06 MED ORDER — SUCCINYLCHOLINE CHLORIDE 200 MG/10ML IV SOSY
PREFILLED_SYRINGE | INTRAVENOUS | Status: AC
  Filled 2017-12-06: qty 10

## 2017-12-06 MED ORDER — DEXAMETHASONE SODIUM PHOSPHATE 10 MG/ML IJ SOLN
INTRAMUSCULAR | Status: DC | PRN
  Administered 2017-12-06: 4 mg via INTRAVENOUS

## 2017-12-06 MED ORDER — BUPIVACAINE-EPINEPHRINE 0.25% -1:200000 IJ SOLN
INTRAMUSCULAR | Status: DC | PRN
  Administered 2017-12-06: 27 mL

## 2017-12-06 MED ORDER — ONDANSETRON HCL 4 MG/2ML IJ SOLN
INTRAMUSCULAR | Status: AC
  Filled 2017-12-06: qty 2

## 2017-12-06 MED ORDER — ACETAMINOPHEN 10 MG/ML IV SOLN: 1000.0000 mg | Freq: Once | INTRAVENOUS | Status: DC | PRN

## 2017-12-06 MED ORDER — SUGAMMADEX SODIUM 200 MG/2ML IV SOLN
INTRAVENOUS | Status: DC | PRN
  Administered 2017-12-06: 200 mg via INTRAVENOUS

## 2017-12-06 MED ORDER — PROPOFOL 10 MG/ML IV BOLUS
INTRAVENOUS | Status: DC | PRN
  Administered 2017-12-06: 120 mg via INTRAVENOUS

## 2017-12-06 MED ORDER — SUGAMMADEX SODIUM 200 MG/2ML IV SOLN
INTRAVENOUS | Status: AC
  Filled 2017-12-06: qty 2

## 2017-12-06 MED ORDER — LACTATED RINGERS IV SOLN
INTRAVENOUS | Status: DC | PRN
  Administered 2017-12-06: 13:00:00 via INTRAVENOUS

## 2017-12-06 MED ORDER — PHENYLEPHRINE 40 MCG/ML (10ML) SYRINGE FOR IV PUSH (FOR BLOOD PRESSURE SUPPORT)
PREFILLED_SYRINGE | INTRAVENOUS | Status: DC | PRN
  Administered 2017-12-06 (×3): 80 ug via INTRAVENOUS

## 2017-12-06 MED ORDER — PROMETHAZINE HCL 25 MG/ML IJ SOLN: 6.2500 mg | INTRAMUSCULAR | Status: DC | PRN

## 2017-12-06 MED ORDER — FENTANYL CITRATE (PF) 100 MCG/2ML IJ SOLN
INTRAMUSCULAR | Status: DC | PRN
  Administered 2017-12-06: 50 ug via INTRAVENOUS

## 2017-12-06 MED ORDER — 0.9 % SODIUM CHLORIDE (POUR BTL) OPTIME
TOPICAL | Status: DC | PRN
  Administered 2017-12-06: 1000 mL

## 2017-12-06 SURGICAL SUPPLY — 31 items
ADH SKN CLS APL DERMABOND .7 (GAUZE/BANDAGES/DRESSINGS) ×1
APPLIER CLIP 5 13 M/L LIGAMAX5 (MISCELLANEOUS) ×2
APR CLP MED LRG 5 ANG JAW (MISCELLANEOUS) ×1
BAG SPEC RTRVL LRG 6X4 10 (ENDOMECHANICALS) ×1
CABLE HIGH FREQUENCY MONO STRZ (ELECTRODE) ×2 IMPLANT
CATH REDDICK CHOLANGI 4FR 50CM (CATHETERS) ×1 IMPLANT
CHLORAPREP W/TINT 26ML (MISCELLANEOUS) ×2 IMPLANT
CLIP APPLIE 5 13 M/L LIGAMAX5 (MISCELLANEOUS) ×1 IMPLANT
COVER MAYO STAND STRL (DRAPES) ×2 IMPLANT
DECANTER SPIKE VIAL GLASS SM (MISCELLANEOUS) ×2 IMPLANT
DERMABOND ADVANCED (GAUZE/BANDAGES/DRESSINGS) ×1
DERMABOND ADVANCED .7 DNX12 (GAUZE/BANDAGES/DRESSINGS) ×1 IMPLANT
DRAPE C-ARM 42X120 X-RAY (DRAPES) ×2 IMPLANT
ELECT REM PT RETURN 15FT ADLT (MISCELLANEOUS) ×2 IMPLANT
GLOVE BIO SURGEON STRL SZ7.5 (GLOVE) ×2 IMPLANT
GOWN STRL REUS W/TWL XL LVL3 (GOWN DISPOSABLE) ×6 IMPLANT
HEMOSTAT SURGICEL 4X8 (HEMOSTASIS) IMPLANT
IV CATH 14GX2 1/4 (CATHETERS) ×1 IMPLANT
KIT BASIN OR (CUSTOM PROCEDURE TRAY) ×2 IMPLANT
POUCH SPECIMEN RETRIEVAL 10MM (ENDOMECHANICALS) ×2 IMPLANT
SCISSORS LAP 5X35 DISP (ENDOMECHANICALS) ×2 IMPLANT
SET IRRIG TUBING LAPAROSCOPIC (IRRIGATION / IRRIGATOR) ×2 IMPLANT
SLEEVE XCEL OPT CAN 5 100 (ENDOMECHANICALS) ×4 IMPLANT
SUT MNCRL AB 4-0 PS2 18 (SUTURE) ×2 IMPLANT
SUT VICRYL 0 UR6 27IN ABS (SUTURE) ×1 IMPLANT
TOWEL OR 17X26 10 PK STRL BLUE (TOWEL DISPOSABLE) ×2 IMPLANT
TOWEL OR NON WOVEN STRL DISP B (DISPOSABLE) ×2 IMPLANT
TRAY LAPAROSCOPIC (CUSTOM PROCEDURE TRAY) ×2 IMPLANT
TROCAR BLADELESS OPT 5 100 (ENDOMECHANICALS) ×2 IMPLANT
TROCAR XCEL BLUNT TIP 100MML (ENDOMECHANICALS) ×2 IMPLANT
TUBING INSUF HEATED (TUBING) ×2 IMPLANT

## 2017-12-06 NOTE — Progress Notes (Signed)
   Dr. McDowell's preoperative cardiac assessment note reviewed. Please let us know if we can be of further assistance. ECG on 12/04/2017 showed sinus rhythm.  [facility] will sign off.   Medication Recommendations:  No new Other recommendations (labs, testing, etc):  none Follow up as an outpatient:  As per prior clinic note.  Candee Furbish, MD

## 2017-12-06 NOTE — Progress Notes (Signed)
PROGRESS NOTE    Shelly Obrien  MRN:[id number] DOB: 03/24/1923 DOA: 12/04/2017 PCP: Griffin, John, MD   Brief Narrative:  Shelly Obrienis a 84 y.o.femalewith medical history significant ofHTN, HLD, PAF s/pcardioversionon Elquis, CAD s/p stents,CKD, and nephrolithiasis; who presents withcomplaints of right flank and abdominal  pain over the week. She was found to have acute cholecystitis. Surgery consulted and she is scheduled to have surgery on Friday , 48 hours for her eliquis to wash out of her system   Assessment & Plan:   Principal Problem:   Abdominal pain Active Problems:   Essential hypertension   CAD (coronary artery disease)   CKD (chronic kidney disease) stage 3, GFR 30-59 ml/min (HCC)   Paroxysmal atrial fibrillation (HCC)   Cholelithiasis   Nephrolithiasis   Right upper quadrant pain secondary to acute cholecystitis: Surgery consulted and recommendations given. Plan for lap cholecystectomy today.   Based off the geriatric sensitive perioperative cardiac risk assessment patient probability of perioperative myocardial infarction or cardiac arrest is 0.5-1.3%.  Cardiology consulted for pre op clearance and to see if she needs additional testing prior to the procedure. No further testing is required.  Pain control and gentle hydration.  ALK PHOS normal. Liver function panel is normal.    Paroxysmal atrial fib with good rate control.  On cardizem and on IV heparin for anti coagulation. Use IV cardizem if needed for rate control, as she is NPO.    Hypertension  Controlled this morning. .     CAD with stent to LAD in the past.  Currently denies any chest pain or sob.    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     DVT prophylaxis:heparin.  Code Status: full code.  Family Communication: spoke briefly with POA at bedside on 7/3 Disposition Plan: pending surgery.   Consultants:   Surgery  Cardiology for pre op clearance.     Procedures: none.   Antimicrobials:none.    Subjective: RUQ PAIN last night , nausea better.  Objective: Vitals:   12/05/17 0402 12/05/17 1423 12/05/17 2100 12/06/17 0514  BP: (!) 152/98 118/78 (!) 146/90 (!) 144/83  Pulse: 63 70 70 63  Resp: 18 12 18 18  Temp: 97.7 F (36.5 C) 98 F (36.7 C) 97.8 F (36.6 C) (!) 97.5 F (36.4 C)  TempSrc: Oral Oral Oral Oral  SpO2: 96% 97% 94% 95%  Weight:      Height:        Intake/Output Summary (Last 24 hours) at 12/06/2017 0756 Last data filed at 12/05/2017 2300 Gross per 24 hour  Intake 3517.97 ml  Output 400 ml  Net 3117.97 ml   Filed Weights   12/04/17 0126 12/04/17 0616  Weight: 72.6 kg (160 lb) 74 kg (163 lb 2.3 oz)    Examination:  General exam: Appears calm and comfortable  Respiratory system: Clear to auscultation. Respiratory effort normal. Cardiovascular system: S1 & S2 heard, irregular.  Gastrointestinal system: Abdomen is soft tender in the RUQ . Non distended bowel sounds heard.  Central nervous system: Alert and oriented. No focal neurological deficits. Extremities: Symmetric 5 x 5 power. Skin: No rashes, lesions or ulcers Psychiatry: . Mood & affect appropriate.     Data Reviewed: I have personally reviewed following labs and imaging studies  CBC: Recent Labs  Lab 11/30/17 0843 12/04/17 0219 12/06/17 0440  WBC 5.7 5.7 4.1  NEUTROABS  --  3.2  --   HGB 12.1 12.3 11.9*  HCT   40.5 39.7 38.7  MCV 89.0 88.2 87.8  PLT 218 208 201   Basic Metabolic Panel: Recent Labs  Lab 11/30/17 0843 12/04/17 0219 12/05/17 0420  NA 141 142 140  K 3.9 4.1 4.0  CL 106 107 106  CO2 22 26 26  GLUCOSE 151* 116* 95  BUN 16 16 11  CREATININE 1.00 0.89 0.77  CALCIUM 9.4 9.5 9.5   GFR: Estimated Creatinine Clearance: 44.7 mL/min (by C-G formula based on SCr of 0.77 mg/dL). Liver Function Tests: Recent Labs  Lab 11/30/17 0843 12/04/17 0219 12/05/17 0420  AST 23 25 21  ALT 13 15 14  ALKPHOS 127* 135* 117   BILITOT 0.8 0.6 1.0  PROT 6.5 7.0 6.1*  ALBUMIN 3.5 3.9 3.3*   Recent Labs  Lab 11/30/17 0843 12/04/17 0219  LIPASE 30 28   No results for input(s): AMMONIA in the last 168 hours. Coagulation Profile: Recent Labs  Lab 12/04/17 0341 12/04/17 1152  INR 1.26 1.15   Cardiac Enzymes: No results for input(s): CKTOTAL, CKMB, CKMBINDEX, TROPONINI in the last 168 hours. BNP (last 3 results) No results for input(s): PROBNP in the last 8760 hours. HbA1C: No results for input(s): HGBA1C in the last 72 hours. CBG: No results for input(s): GLUCAP in the last 168 hours. Lipid Profile: No results for input(s): CHOL, HDL, LDLCALC, TRIG, CHOLHDL, LDLDIRECT in the last 72 hours. Thyroid Function Tests: No results for input(s): TSH, T4TOTAL, FREET4, T3FREE, THYROIDAB in the last 72 hours. Anemia Panel: No results for input(s): VITAMINB12, FOLATE, FERRITIN, TIBC, IRON, RETICCTPCT in the last 72 hours. Sepsis Labs: No results for input(s): PROCALCITON, LATICACIDVEN in the last 168 hours.  Recent Results (from the past 240 hour(s))  Surgical pcr screen     Status: None   Collection Time: 12/06/17  4:55 AM  Result Value Ref Range Status   MRSA, PCR NEGATIVE NEGATIVE Final   Staphylococcus aureus NEGATIVE NEGATIVE Final    Comment: (NOTE) The Xpert SA Assay (FDA approved for NASAL specimens in patients 22 years of age and older), is one component of a comprehensive surveillance program. It is not intended to diagnose infection nor to guide or monitor treatment. Performed at [facility], [address] [place], [place] [address]          Radiology Studies: No results found.      Scheduled Meds: . acetaminophen  1,000 mg Oral On Call to OR  . diltiazem  120 mg Oral BID  . gabapentin  300 mg Oral On Call to OR  . pantoprazole  20 mg Oral Daily  . sodium chloride flush  3 mL Intravenous Q12H   Continuous Infusions: . sodium chloride 50 mL/hr at 12/05/17  1831  . clindamycin (CLEOCIN) IV     And  . gentamicin       LOS: 2 days    Time spent: 35 min    Vijaya Akula, MD [facility] Pager [phone]   If 7PM-7AM, please contact night-coverage [email or web address] Password [email or web address] 12/06/2017, 7:56 AM   

## 2017-12-06 NOTE — Anesthesia Procedure Notes (Signed)
Procedure Name: Intubation Date/Time: 12/06/2017 1:31 PM Performed by: Claudia Desanctis, CRNA Pre-anesthesia Checklist: Patient identified, Emergency Drugs available, Suction available and Patient being monitored Patient Re-evaluated:Patient Re-evaluated prior to induction Oxygen Delivery Method: Circle system utilized Preoxygenation: Pre-oxygenation with 100% oxygen Induction Type: IV induction Ventilation: Mask ventilation without difficulty Laryngoscope Size: 2 and Miller Grade View: Grade I Tube type: Oral Tube size: 7.0 mm Number of attempts: 1 Airway Equipment and Method: Stylet Placement Confirmation: ETT inserted through vocal cords under direct vision,  positive ETCO2 and breath sounds checked- equal and bilateral Secured at: 22 cm Tube secured with: Tape Dental Injury: Teeth and Oropharynx as per pre-operative assessment

## 2017-12-06 NOTE — Anesthesia Preprocedure Evaluation (Addendum)
Anesthesia Evaluation    History of Anesthesia Complications (+) PONV  Airway Mallampati: II  TM Distance: >3 FB Neck ROM: Full    Dental no notable dental hx.    Pulmonary    Pulmonary exam normal breath sounds clear to auscultation       Cardiovascular hypertension, + CAD and + Past MI  + dysrhythmias Atrial Fibrillation  Rhythm:Irregular Rate:Normal  Left ventricle:  The cavity size was normal. There was moderate focal basal and mild concentric hypertrophy. Systolic function was normal. The estimated ejection fraction was in the range of 55% to 60%.  Regional wall motion abnormalities:   There is akinesis of the basal-midanteroseptal and inferoseptal myocardium.  There is akinesis of the apicalinferior myocardium. Normal sinus rhythm was absent. The study was not technically sufficient to allow evaluation of LV diastolic dysfunction due to atrial fibrillation.   ------------------------------------------------------------------- Aortic valve:   Trileaflet; mildly thickened, moderately calcified leaflets. Mobility was not restricted.  Doppler:  Transvalvular velocity was within the normal range. There was no stenosis. There was no regurgitation.  ------------------------------------------------------------------- Aorta:  Aortic root: The aortic root was normal in size.  ------------------------------------------------------------------- Mitral valve:   Structurally normal valve.   Mobility was not restricted.  Doppler:  Transvalvular velocity was within the normal range. There was no evidence for stenosis. There was no regurgitation.    Peak gradient (D): 3 mm Hg.  ------------------------------------------------------------------- Left atrium:  The atrium was normal in size.  ------------------------------------------------------------------- Right ventricle:  The cavity size was normal. Wall thickness was normal.  The moderator band was prominent. Systolic function was normal.    Neuro/Psych    GI/Hepatic   Endo/Other    Renal/GU      Musculoskeletal   Abdominal   Peds  Hematology   Anesthesia Other Findings   Reproductive/Obstetrics                            Anesthesia Physical Anesthesia Plan  ASA: III  Anesthesia Plan: General   Post-op Pain Management:    Induction: Intravenous  PONV Risk Score and Plan: 3 and Ondansetron, Dexamethasone and Treatment may vary due to age or medical condition  Airway Management Planned: Oral ETT  Additional Equipment:   Intra-op Plan:   Post-operative Plan: Extubation in OR  Informed Consent: I have reviewed the patients History and Physical, chart, labs and discussed the procedure including the risks, benefits and alternatives for the proposed anesthesia with the patient or authorized representative who has indicated his/her understanding and acceptance.   Dental advisory given  Plan Discussed with: CRNA and Surgeon  Anesthesia Plan Comments:         Anesthesia Quick Evaluation

## 2017-12-06 NOTE — Progress Notes (Signed)
   Subjective/Chief Complaint: Complains of RUQ pain   Objective: Vital signs in last 24 hours: Temp:  [97.5 F (36.4 C)-98 F (36.7 C)] 97.5 F (36.4 C) (07/05 0514) Pulse Rate:  [63-70] 63 (07/05 0514) Resp:  [12-18] 18 (07/05 0514) BP: (118-146)/(78-90) 144/83 (07/05 0514) SpO2:  [94 %-97 %] 95 % (07/05 0514) Last BM Date: 12/03/17  Intake/Output from previous day: 07/04 0701 - 07/05 0700 In: 3518 [P.O.:1780; I.V.:1738] Out: 400 [Urine:400] Intake/Output this shift: No intake/output data recorded.  General appearance: alert and cooperative Resp: clear to auscultation bilaterally Cardio: irregularly irregular rhythm GI: soft, moderate RUQ tenderness. no palpable mass  Lab Results:  Recent Labs    12/04/17 0219 12/06/17 0440  WBC 5.7 4.1  HGB 12.3 11.9*  HCT 39.7 38.7  PLT 208 201   BMET Recent Labs    12/04/17 0219 12/05/17 0420  NA 142 140  K 4.1 4.0  CL 107 106  CO2 26 26  GLUCOSE 116* 95  BUN 16 11  CREATININE 0.89 0.77  CALCIUM 9.5 9.5   PT/INR Recent Labs    12/04/17 0341 12/04/17 1152  LABPROT 15.7* 14.6  INR 1.26 1.15   ABG No results for input(s): PHART, HCO3 in the last 72 hours.  Invalid input(s): PCO2, PO2  Studies/Results: No results found.  Anti-infectives: Anti-infectives (From admission, onward)   Start     Dose/Rate Route Frequency Ordered Stop   12/06/17 0600  clindamycin (CLEOCIN) IVPB 900 mg     900 mg 100 mL/hr over 30 Minutes Intravenous On call to O.R. 12/05/17 0840 12/07/17 0559   12/06/17 0600  gentamicin (GARAMYCIN) 370 mg in dextrose 5 % 100 mL IVPB     5 mg/kg  74 kg 109.3 mL/hr over 60 Minutes Intravenous On call to O.R. 12/05/17 0840 12/07/17 0559      Assessment/Plan: s/p Procedure(s): LAPAROSCOPIC CHOLECYSTECTOMY WITH INTRAOPERATIVE CHOLANGIOGRAM (N/A) plan for lap chole with ioc today . Risks and benefits of the surgery discussed with the patient and she understands and wishes to proceed  LOS: 2  days    TOTH III,PAUL S 12/06/2017

## 2017-12-06 NOTE — Op Note (Signed)
12/04/2017 - 12/06/2017  2:19 PM  PATIENT:  Shelly Obrien  84 y.o. female  PRE-OPERATIVE DIAGNOSIS:  gallstones  POST-OPERATIVE DIAGNOSIS:  gallstones  PROCEDURE:  Procedure(s): LAPAROSCOPIC CHOLECYSTECTOMY (N/A)  Umbilical hernia repair  SURGEON:  Surgeon(s) and Role:    * Jovita Kussmaul, MD - Primary  PHYSICIAN ASSISTANT:   ASSISTANTS: none   ANESTHESIA:   general  EBL:  Minimal   BLOOD ADMINISTERED:none  DRAINS: none   LOCAL MEDICATIONS USED:  MARCAINE     SPECIMEN:  Source of Specimen:  gallbladder  DISPOSITION OF SPECIMEN:  PATHOLOGY  COUNTS:  YES  TOURNIQUET:  * No tourniquets in log *  DICTATION: .Dragon Dictation   @opnoteheader @  Procedure: After informed consent was obtained the patient was brought to the operating room and placed in the supine position on the operating room table. After adequate induction of general anesthesia the patient's abdomen was prepped with ChloraPrep allowed to dry and draped in usual sterile manner. An appropriate timeout was performed. The area below the umbilicus was infiltrated with quarter percent  Marcaine. A small incision was made with a 15 blade knife. The incision was carried down through the subcutaneous tissue bluntly with a hemostat and [employer] retractors. The linea alba was identified. The linea alba was incised with a 15 blade knife and each side was grasped with Coker clamps. The preperitoneal space was then probed with a hemostat until the peritoneum was opened and access was gained to the abdominal cavity. A 0 Vicryl pursestring stitch was placed in the fascia surrounding the opening. A Hassan cannula was then placed through the opening and anchored in place with the previously placed Vicryl purse string stitch. The abdomen was insufflated with carbon dioxide without difficulty. A laparoscope was inserted through the [facility] cannula in the right upper quadrant was inspected. Next the epigastric region was infiltrated with  % Marcaine. A small incision was made with a 15 blade knife. A 5 mm port was placed bluntly through this incision into the abdominal cavity under direct vision. Next 2 sites were chosen laterally on the right side of the abdomen for placement of 5 mm ports. Each of these areas was infiltrated with quarter percent Marcaine. Small stab incisions were made with a 15 blade knife. 5 mm ports were then placed bluntly through these incisions into the abdominal cavity under direct vision without difficulty.  There was an area of adhesion of the right colon to the abdominal wall which was easily taken down sharply with laparoscopic scissors.  There were also some adhesions to the body of the gallbladder which were also taken down sharply with laparoscopic scissors.  A blunt grasper was placed through the lateralmost 5 mm port and used to grasp the dome of the gallbladder and elevated anteriorly and superiorly. Another blunt grasper was placed through the other 5 mm port and used to retract the body and neck of the gallbladder. A dissector was placed through the epigastric port and using the electrocautery the peritoneal reflection at the gallbladder neck was opened. Blunt dissection was then carried out in this area until the gallbladder neck-cystic duct junction was readily identified and a good window was created.  Since we could see her anatomy very well and because of her contrast allergy we then placed 3 clips proximally on the cystic duct and one distally and the duct was divided between the 2 sets of clips. Posterior to this the cystic artery was identified and again dissected bluntly in  a circumferential manner until a good window  was created. 2 clips were placed proximally and one distally on the artery and the artery was divided between the 2 sets of clips. Next a laparoscopic hook cautery device was used to separate the gallbladder from the liver bed. Prior to completely detaching the gallbladder from the liver  bed the liver bed was inspected and several small bleeding points were coagulated with the electrocautery until the area was completely hemostatic. The gallbladder was then detached the rest of it from the liver bed without difficulty. A laparoscopic bag was inserted through the hassan port. The laparoscope was moved to the epigastric port. The gallbladder was placed within the bag and the bag was sealed.  The bag with the gallbladder was then removed with the [facility] cannula through the infraumbilical port without difficulty. The fascial defect was then closed with the previously placed Vicryl pursestring stitch as well as with another figure-of-eight 0 Vicryl stitch.  The umbilical hernia was also closed with a figure-of-eight 0 Vicryl stitch.  The liver bed was inspected again and found to be hemostatic. The abdomen was irrigated with copious amounts of saline until the effluent was clear. The ports were then removed under direct vision without difficulty and were found to be hemostatic. The gas was allowed to escape. The skin incisions were all closed with interrupted 4-0 Monocryl subcuticular stitches. Dermabond dressings were applied. The patient tolerated the procedure well. At the end of the case all needle sponge and instrument counts were correct. The patient was then awakened and taken to recovery in stable condition  PLAN OF CARE: Admit to inpatient   PATIENT DISPOSITION:  PACU - hemodynamically stable.   Delay start of Pharmacological VTE agent (>24hrs) due to surgical blood loss or risk of bleeding: yes

## 2017-12-06 NOTE — Transfer of Care (Signed)
Immediate Anesthesia Transfer of Care Note  Patient: Shelly Obrien  Procedure(s) Performed: LAPAROSCOPIC CHOLECYSTECTOMY (N/A )  Patient Location: PACU  Anesthesia Type:General  Level of Consciousness: awake, alert , oriented and patient cooperative  Airway & Oxygen Therapy: Patient Spontanous Breathing and Patient connected to face mask  Post-op Assessment: Report given to RN and Post -op Vital signs reviewed and stable  Post vital signs: Reviewed and stable  Last Vitals:  Vitals Value Taken Time  BP    Temp    Pulse 61 12/06/2017  2:29 PM  Resp 13 12/06/2017  2:29 PM  SpO2 100 % 12/06/2017  2:29 PM  Vitals shown include unvalidated device data.  Last Pain:  Vitals:   12/06/17 1229  TempSrc:   PainSc: 6       Patients Stated Pain Goal: 3 (77/93/90 3009)  Complications: No apparent anesthesia complications

## 2017-12-06 NOTE — Anesthesia Postprocedure Evaluation (Signed)
Anesthesia Post Note  Patient: Shelly Obrien  Procedure(s) Performed: LAPAROSCOPIC CHOLECYSTECTOMY (N/A )     Patient location during evaluation: PACU Anesthesia Type: General Level of consciousness: awake and alert Pain management: pain level controlled Vital Signs Assessment: post-procedure vital signs reviewed and stable Respiratory status: spontaneous breathing, nonlabored ventilation, respiratory function stable and patient connected to nasal cannula oxygen Cardiovascular status: blood pressure returned to baseline and stable Postop Assessment: no apparent nausea or vomiting Anesthetic complications: no    Last Vitals:  Vitals:   12/06/17 1500 12/06/17 1510  BP: 138/77 136/74  Pulse: 66 67  Resp: 16 18  Temp: 36.8 C 36.6 C  SpO2: 98% 98%    Last Pain:  Vitals:   12/06/17 1500  TempSrc:   PainSc: 0-No pain                 Tiajuana Amass

## 2017-12-06 NOTE — Progress Notes (Signed)
[place] for IV heparin Indication: atrial fibrillation  Allergies  Allergen Reactions  . Antihistamines, Chlorpheniramine-Type Other (See Comments)    Pt states that she passed out.   [name] Penicillins Anaphylaxis and Other (See Comments)    Has patient had a PCN reaction causing immediate rash, facial/tongue/throat swelling, SOB or lightheadedness with hypotension: Yes Has patient had a PCN reaction causing severe rash involving mucus membranes or skin necrosis: No Has patient had a PCN reaction that required hospitalization No Has patient had a PCN reaction occurring within the last 10 years: No If all of the above answers are "NO", then may proceed with Cephalosporin use.  . Contrast Media [Iodinated Diagnostic Agents] Hives  . Sulfonamide Derivatives Other (See Comments)    Reaction:  Unknown   . Atorvastatin Other (See Comments)    Reaction:  Unknown   . Pheniramine Other (See Comments)    Pt states that she passed out.    Patient Measurements: Height: 5' 5.5" (166.4 cm) Weight: 163 lb 2.3 oz (74 kg) IBW/kg (Calculated) : 58.15 Heparin Dosing Weight: 73 kg  Vital Signs: Temp: 97.8 F (36.6 C) (07/05 1510) Temp Source: Oral (07/05 1146) BP: 136/74 (07/05 1510) Pulse Rate: 67 (07/05 1510)  Labs: Recent Labs    12/04/17 0219 12/04/17 0341  12/04/17 1152 12/04/17 1937 12/05/17 0420 12/05/17 1152 12/06/17 0440  HGB 12.3  --   --   --   --   --   --  11.9*  HCT 39.7  --   --   --   --   --   --  38.7  PLT 208  --   --   --   --   --   --  201  APTT  --   --    < > 31 65* 70* 71*  --   LABPROT  --  15.7*  --  14.6  --   --   --   --   INR  --  1.26  --  1.15  --   --   --   --   HEPARINUNFRC  --   --   --  >2.20*  --  1.14* 1.10*  --   CREATININE 0.89  --   --   --   --  0.77  --   --    < > = values in this interval not displayed.   Estimated Creatinine Clearance: 44.7 mL/min (by C-G formula based on SCr of 0.77  mg/dL).  Medications:  Scheduled:  . diltiazem  120 mg Oral BID  . pantoprazole  20 mg Oral Daily  . sodium chloride flush  3 mL Intravenous Q12H   PRN: acetaminophen **OR** acetaminophen, albuterol, hydrALAZINE, morphine injection, ondansetron **OR** ondansetron (ZOFRAN) IV, polyvinyl alcohol  Assessment: 68 yoF with PMH Afib on Eliquis, HTN, HLD, CAD s/p PCI, CKD, nephrolithiasis, admitted 7/3 for cholelithiasis with concern for acute cholecystitis. Eliquis held for procedure, likely on 7/5. Pharmacy to dose heparin in the interim.  Prior anticoagulation: Eliquis 5 mg PO bid, LD 7/2 PM  DOACs will increase Heparin levels, need to monitor with aPTT  Heparin turned off at 0200 7/5 for lap cholecystectomy    Today, 12/06/2017:  Post-op pharmacy consult to resume heparin gtt at 0600 7/6    Goal of Therapy: Heparin level 0.3-0.7 units/ml APTT 66-102 sec Monitor platelets by anticoagulation protocol: Yes  Plan:  As per surgery's order, resume heparin at 0600  7/6.  Resume at 900 units/hr as aPTT was therapeutic at this rate (no bolus)  Daily CBC  F/u resuming home apixaban anti-coagulation  Doreene Eland, PharmD, BCPS.   Pager: [phone] 12/06/2017 3:36 PM

## 2017-12-07 ENCOUNTER — Encounter (HOSPITAL_COMMUNITY): Payer: Self-pay | Admitting: General Surgery

## 2017-12-07 LAB — CBC
HCT: 40.9 % (ref 36.0–46.0)
Hemoglobin: 12.5 g/dL (ref 12.0–15.0)
MCH: 27.1 pg (ref 26.0–34.0)
MCHC: 30.6 g/dL (ref 30.0–36.0)
MCV: 88.5 fL (ref 78.0–100.0)
Platelets: 228 10*3/uL (ref 150–400)
RBC: 4.62 MIL/uL (ref 3.87–5.11)
RDW: 17.5 % — ABNORMAL HIGH (ref 11.5–15.5)
WBC: 6.7 10*3/uL (ref 4.0–10.5)

## 2017-12-07 MED ORDER — APIXABAN 5 MG PO TABS
5.0000 mg | ORAL_TABLET | Freq: Two times a day (BID) | ORAL | Status: DC
  Administered 2017-12-07 – 2017-12-11 (×9): 5 mg via ORAL
  Filled 2017-12-07 (×9): qty 1

## 2017-12-07 NOTE — Progress Notes (Signed)
1 Day Post-Op   Subjective/Chief Complaint: Feeling well, no nausea. Pain well controlled, preop pain resolved.    Objective: Vital signs in last 24 hours: Temp:  [97.3 F (36.3 C)-98.3 F (36.8 C)] 98.3 F (36.8 C) (07/06 0521) Pulse Rate:  [61-68] 65 (07/06 0521) Resp:  [12-18] 18 (07/06 0521) BP: (120-158)/(74-89) 120/80 (07/06 0521) SpO2:  [93 %-100 %] 93 % (07/06 0521) Last BM Date: 12/03/17  Intake/Output from previous day: 07/05 0701 - 07/06 0700 In: 1702.1 [I.V.:1552.1; IV Piggyback:150] Out: 1000 [Urine:1000] Intake/Output this shift: No intake/output data recorded.  General appearance: alert and cooperative Resp: clear to auscultation bilaterally Cardio: no pedal edema GI: soft, non-tender; bowel sounds normal; no masses,  no organomegaly Extremities: extremities normal, atraumatic, no cyanosis or edema Neurologic: Grossly normal Incision/Wound: c/d/i with dermabond  Lab Results:  Recent Labs    12/06/17 0440 12/07/17 0408  WBC 4.1 6.7  HGB 11.9* 12.5  HCT 38.7 40.9  PLT 201 228   BMET Recent Labs    12/05/17 0420  NA 140  K 4.0  CL 106  CO2 26  GLUCOSE 95  BUN 11  CREATININE 0.77  CALCIUM 9.5   PT/INR Recent Labs    12/04/17 1152  LABPROT 14.6  INR 1.15   ABG No results for input(s): PHART, HCO3 in the last 72 hours.  Invalid input(s): PCO2, PO2  Studies/Results: No results found.  Anti-infectives: Anti-infectives (From admission, onward)   Start     Dose/Rate Route Frequency Ordered Stop   12/06/17 0600  clindamycin (CLEOCIN) IVPB 900 mg     900 mg 100 mL/hr over 30 Minutes Intravenous On call to O.R. 12/05/17 0840 12/06/17 1327   12/06/17 0600  gentamicin (GARAMYCIN) 370 mg in dextrose 5 % 100 mL IVPB  Status:  Discontinued     5 mg/kg  74 kg 109.3 mL/hr over 60 Minutes Intravenous On call to O.R. 12/05/17 0840 12/06/17 1519      Assessment/Plan: s/p Procedure(s): LAPAROSCOPIC CHOLECYSTECTOMY (N/A) Advance  diet anticipate if hgb stable on anticoagulation tomorrow AM can dc  LOS: 3 days    Clovis Riley 12/07/2017

## 2017-12-07 NOTE — Progress Notes (Signed)
PROGRESS NOTE    Shelly Obrien  AQT:[id number] DOB: Dec 03, 1922 DOA: 12/04/2017 PCP: Shelly Orn, MD   Brief Narrative:  Shelly Obrien a 84 y.o.femalewith medical history significant ofHTN, HLD, PAF s/pcardioversionon Elquis, CAD s/p stents,CKD, and nephrolithiasis; who presents withcomplaints of right flank and abdominal  pain over the week. She was found to have acute cholecystitis. Surgery consulted and she is scheduled to have surgery on Friday , 48 hours for her eliquis to wash out of her system. She underwent lap cholecystectomy on 7/5 and is currently on soft diet.    Assessment & Plan:   Principal Problem:   Abdominal pain Active Problems:   Essential hypertension   CAD (coronary artery disease)   CKD (chronic kidney disease) stage 3, GFR 30-59 ml/min (HCC)   Paroxysmal atrial fibrillation (HCC)   Cholelithiasis   Nephrolithiasis   Right upper quadrant pain secondary to acute cholecystitis: Surgery consulted and recommendations given. Underwent lap cholecystectomy without any issues.   Based off the geriatric sensitive perioperative cardiac risk assessment patient probability of perioperative myocardial infarction or cardiac arrest is 0.5-1.3%. Advanced diet by surgery. No nausea or abdominal pain.  Plan for /c in am if hemoglobin stable on eliquis.   Cardiology consulted for pre op clearance and to see if she needs additional testing prior to the procedure. No further testing is required.  Pain control and gentle hydration.  ALK PHOS normal. Liver function panel is normal.    Paroxysmal atrial fib with good rate control.  Resume cardizem and eliquis.    Hypertension  Controlled this morning. [name]     CAD with stent to LAD in the past.  Currently denies any chest pain or sob.    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     DVT prophylaxis:heparin.  Code Status: full code.  Family Communication: none at bedside.  Disposition  Plan: Home tomorrow.   Consultants:   Surgery  Cardiology for pre op clearance.    Procedures:lap cholecystectomy. [name]   Antimicrobials:none.    Subjective: No nausea, or abdominal pain.  Objective: Vitals:   12/06/17 1510 12/06/17 2020 12/07/17 0521 12/07/17 1135  BP: 136/74 133/76 120/80 129/73  Pulse: 67 66 65 71  Resp: _0 Temp: 97.8 F (36.6 C) 98 F (36.7 C) 98.3 F (36.8 C) 97.9 F (36.6 C)  TempSrc:  Oral Oral Oral  SpO2: 98% 99% 93% 95%  Weight:      Height:        Intake/Output Summary (Last 24 hours) at 12/07/2017 1152 Last data filed at 12/07/2017 0900 Gross per 24 hour  Intake 2060.1 ml  Output 700 ml  Net 1360.1 ml   Filed Weights   12/04/17 0126 12/04/17 0616  Weight: 72.6 kg (160 lb) 74 kg (163 lb 2.3 oz)    Examination:  General exam: Appears calm and comfortable  Respiratory system: Clear to auscultation. Respiratory effort normal. No wheezing or rhonchi.  Cardiovascular system: S1 & S2 heard, irregular.  Gastrointestinal system: Abdomen is soft nontender non distended bowel sounds good.  Central nervous system: Alert and oriented. No focal neurological deficits. Extremities: Symmetric 5 x 5 power. Skin: No rashes, lesions or ulcers Psychiatry: . Mood & affect appropriate.     Data Reviewed: I have personally reviewed following labs and imaging studies  CBC: Recent Labs  Lab 12/04/17 0219 12/06/17 0440 12/07/17 0408  WBC 5.7 4.1 6.7  NEUTROABS 3.2  --   --  HGB 12.3 11.9* 12.5  HCT 39.7 38.7 40.9  MCV 88.2 87.8 88.5  PLT 208 201 528   Basic Metabolic Panel: Recent Labs  Lab 12/04/17 0219 12/05/17 0420  NA 142 140  K 4.1 4.0  CL 107 106  CO2 26 26  GLUCOSE 116* 95  BUN 16 11  CREATININE 0.89 0.77  CALCIUM 9.5 9.5   GFR: Estimated Creatinine Clearance: 44.7 mL/min (by C-G formula based on SCr of 0.77 mg/dL). Liver Function Tests: Recent Labs  Lab 12/04/17 0219 12/05/17 0420  AST 25 21  ALT 15 14    ALKPHOS 135* 117  BILITOT 0.6 1.0  PROT 7.0 6.1*  ALBUMIN 3.9 3.3*   Recent Labs  Lab 12/04/17 0219  LIPASE 28   No results for input(s): AMMONIA in the last 168 hours. Coagulation Profile: Recent Labs  Lab 12/04/17 0341 12/04/17 1152  INR 1.26 1.15   Cardiac Enzymes: No results for input(s): CKTOTAL, CKMB, CKMBINDEX, TROPONINI in the last 168 hours. BNP (last 3 results) No results for input(s): PROBNP in the last 8760 hours. HbA1C: No results for input(s): HGBA1C in the last 72 hours. CBG: No results for input(s): GLUCAP in the last 168 hours. Lipid Profile: No results for input(s): CHOL, HDL, LDLCALC, TRIG, CHOLHDL, LDLDIRECT in the last 72 hours. Thyroid Function Tests: No results for input(s): TSH, T4TOTAL, FREET4, T3FREE, THYROIDAB in the last 72 hours. Anemia Panel: No results for input(s): VITAMINB12, FOLATE, FERRITIN, TIBC, IRON, RETICCTPCT in the last 72 hours. Sepsis Labs: No results for input(s): PROCALCITON, LATICACIDVEN in the last 168 hours.  Recent Results (from the past 240 hour(s))  Surgical pcr screen     Status: None   Collection Time: 12/06/17  4:55 AM  Result Value Ref Range Status   MRSA, PCR NEGATIVE NEGATIVE Final   Staphylococcus aureus NEGATIVE NEGATIVE Final    Comment: (NOTE) The Xpert SA Assay (FDA approved for NASAL specimens in patients 22 years of age and older), is one component of a comprehensive surveillance program. It is not intended to diagnose infection nor to guide or monitor treatment. Performed at [facility], [place] [address] [place], [place] [address]          Radiology Studies: No results found.      Scheduled Meds: . apixaban  5 mg Oral BID  . diltiazem  120 mg Oral BID  . pantoprazole  20 mg Oral Daily  . sodium chloride flush  3 mL Intravenous Q12H   Continuous Infusions: . sodium chloride 50 mL/hr at 12/07/17 0800     LOS: 3 days    Time spent: 35 min    Hosie Poisson,  MD [facility] Pager [phone]   If 7PM-7AM, please contact night-coverage [email or web address] Password [email or web address] 12/07/2017, 11:52 AM

## 2017-12-07 NOTE — Progress Notes (Signed)
[name] for Shelly Obrien Indication: atrial fibrillation  Allergies  Allergen Reactions  . Antihistamines, Chlorpheniramine-Type Other (See Comments)    Pt states that she passed out.   [name] Penicillins Anaphylaxis and Other (See Comments)    Has patient had a PCN reaction causing immediate rash, facial/tongue/throat swelling, SOB or lightheadedness with hypotension: Yes Has patient had a PCN reaction causing severe rash involving mucus membranes or skin necrosis: No Has patient had a PCN reaction that required hospitalization No Has patient had a PCN reaction occurring within the last 10 years: No If all of the above answers are "NO", then may proceed with Cephalosporin use.  . Contrast Media [Iodinated Diagnostic Agents] Hives  . Sulfonamide Derivatives Other (See Comments)    Reaction:  Unknown   . Atorvastatin Other (See Comments)    Reaction:  Unknown   . Pheniramine Other (See Comments)    Pt states that she passed out.    Patient Measurements: Height: 5' 5.5" (166.4 cm) Weight: 163 lb 2.3 oz (74 kg) IBW/kg (Calculated) : 58.15 Heparin Dosing Weight: 73 kg  Vital Signs: Temp: 98.3 F (36.8 C) (07/06 0521) Temp Source: Oral (07/06 0521) BP: 120/80 (07/06 0521) Pulse Rate: 65 (07/06 0521)  Labs: Recent Labs    12/04/17 1152 12/04/17 1937 12/05/17 0420 12/05/17 1152 12/06/17 0440 12/07/17 0408  HGB  --   --   --   --  11.9* 12.5  HCT  --   --   --   --  38.7 40.9  PLT  --   --   --   --  201 228  APTT 31 65* 70* 71*  --   --   LABPROT 14.6  --   --   --   --   --   INR 1.15  --   --   --   --   --   HEPARINUNFRC >2.20*  --  1.14* 1.10*  --   --   CREATININE  --   --  0.77  --   --   --    Estimated Creatinine Clearance: 44.7 mL/min (by C-G formula based on SCr of 0.77 mg/dL).  Medications:  Scheduled:  . apixaban  5 mg Oral BID  . diltiazem  120 mg Oral BID  . pantoprazole  20 mg Oral Daily  . sodium chloride flush  3 mL  Intravenous Q12H   PRN: acetaminophen **OR** acetaminophen, albuterol, hydrALAZINE, morphine injection, ondansetron **OR** ondansetron (ZOFRAN) IV, polyvinyl alcohol  Assessment: 31 yoF with PMH Afib on Shelly Obrien, HTN, HLD, CAD s/p PCI, CKD, nephrolithiasis, admitted 7/3 for cholelithiasis with concern for acute cholecystitis. Shelly Obrien held for procedure, likely on 7/5. Pharmacy to dose heparin in the interim.  Prior anticoagulation: Shelly Obrien 5 mg PO bid, LD 7/2 PM  DOACs will increase Heparin levels, need to monitor with aPTT  Heparin turned off at 0200 7/5 for lap cholecystectomy    Today, 12/07/2017:  Post-op pharmacy consult to resume heparin gtt at 0600 7/6   CBC stable  Plan to change IV heparin back to home Shelly Obrien dose   Plan:  D/C heparin & heparin levels  Resume Shelly Obrien 5mg  po BID  CBC in am-->noted plans to d/c home tomorrow if CBC stable  Netta Cedars, PharmD, BCPS 12/07/2017 10:19 AM

## 2017-12-08 LAB — IRON AND TIBC
Iron: 33 ug/dL (ref 28–170)
Saturation Ratios: 8 % — ABNORMAL LOW (ref 10.4–31.8)
TIBC: 414 ug/dL (ref 250–450)
UIBC: 381 ug/dL

## 2017-12-08 LAB — CBC
HCT: 35.1 % — ABNORMAL LOW (ref 36.0–46.0)
Hemoglobin: 10.8 g/dL — ABNORMAL LOW (ref 12.0–15.0)
MCH: 27.4 pg (ref 26.0–34.0)
MCHC: 30.8 g/dL (ref 30.0–36.0)
MCV: 89.1 fL (ref 78.0–100.0)
Platelets: 211 10*3/uL (ref 150–400)
RBC: 3.94 MIL/uL (ref 3.87–5.11)
RDW: 17.8 % — ABNORMAL HIGH (ref 11.5–15.5)
WBC: 8.8 10*3/uL (ref 4.0–10.5)

## 2017-12-08 LAB — BASIC METABOLIC PANEL
Anion gap: 6 (ref 5–15)
BUN: 18 mg/dL (ref 8–23)
CO2: 28 mmol/L (ref 22–32)
Calcium: 9.2 mg/dL (ref 8.9–10.3)
Chloride: 110 mmol/L (ref 98–111)
Creatinine, Ser: 0.84 mg/dL (ref 0.44–1.00)
GFR calc Af Amer: >60 mL/min (ref >60)
GFR calc non Af Amer: 58 mL/min — ABNORMAL LOW (ref >60)
Glucose, Bld: 120 mg/dL — ABNORMAL HIGH (ref 70–99)
Potassium: 3.8 mmol/L (ref 3.5–5.1)
Sodium: 144 mmol/L (ref 135–145)

## 2017-12-08 LAB — RETICULOCYTES
RBC.: 4.38 MIL/uL (ref 3.87–5.11)
Retic Count, Absolute: 70.1 10*3/uL (ref 19.0–186.0)
Retic Ct Pct: 1.6 % (ref 0.4–3.1)

## 2017-12-08 LAB — VITAMIN B12: Vitamin B-12: 287 pg/mL (ref 180–914)

## 2017-12-08 LAB — FERRITIN: Ferritin: 10 ng/mL — ABNORMAL LOW (ref 11–307)

## 2017-12-08 LAB — FOLATE: Folate: 9.4 ng/mL (ref >5.9)

## 2017-12-08 MED ORDER — SIMETHICONE 40 MG/0.6ML PO SUSP
40.0000 mg | Freq: Four times a day (QID) | ORAL | Status: DC | PRN
  Administered 2017-12-08: 40 mg via ORAL
  Filled 2017-12-08 (×2): qty 0.6

## 2017-12-08 MED ORDER — PANTOPRAZOLE SODIUM 40 MG PO TBEC
40.0000 mg | DELAYED_RELEASE_TABLET | Freq: Every day | ORAL | Status: DC
  Administered 2017-12-09 – 2017-12-11 (×3): 40 mg via ORAL
  Filled 2017-12-08 (×3): qty 1

## 2017-12-08 MED ORDER — DOCUSATE SODIUM 100 MG PO CAPS
100.0000 mg | ORAL_CAPSULE | Freq: Two times a day (BID) | ORAL | Status: DC
  Administered 2017-12-08: 100 mg via ORAL
  Filled 2017-12-08: qty 1

## 2017-12-08 MED ORDER — TRAMADOL HCL 50 MG PO TABS
100.0000 mg | ORAL_TABLET | Freq: Four times a day (QID) | ORAL | Status: DC | PRN
Start: 2017-12-08 — End: 2017-12-10
  Administered 2017-12-08 – 2017-12-09 (×3): 100 mg via ORAL
  Filled 2017-12-08 (×3): qty 2

## 2017-12-08 MED ORDER — BISACODYL 10 MG RE SUPP
10.0000 mg | Freq: Once | RECTAL | Status: AC
  Administered 2017-12-08: 10 mg via RECTAL
  Filled 2017-12-08: qty 1

## 2017-12-08 MED ORDER — SENNOSIDES-DOCUSATE SODIUM 8.6-50 MG PO TABS
2.0000 | ORAL_TABLET | Freq: Two times a day (BID) | ORAL | Status: DC
  Administered 2017-12-08 – 2017-12-11 (×7): 2 via ORAL
  Filled 2017-12-08 (×7): qty 2

## 2017-12-08 NOTE — Evaluation (Signed)
Physical Therapy Evaluation Patient Details Name: Shelly Obrien MRN: [id number] DOB: [birth year] Today's Date: 12/08/2017   History of Present Illness  84 y.o. female with medical history significant of HTN, HLD, PAF s/p cardioversion on Elquis, CAD s/p stents, CKD, and nephrolithiasis; who presents with complaints of right flank and abdominal  pain over the week. She was found to have acute cholecystitis and underwent lap cholecystectomy on 7/5  Clinical Impression  Pt admitted with above diagnosis. Pt currently with functional limitations due to the deficits listed below (see PT Problem List).  Pt will benefit from skilled PT to increase their independence and safety with mobility to allow discharge to the venue listed below.  Pt assisted with ambulating in hallway and mildly unsteady however no overt LOB.  Recommended pt use SPC or RW at home for safety.  Pt plans to d/c home with her cousin prior to home alone.     Follow Up Recommendations Home health PT;Supervision for mobility/OOB    Equipment Recommendations  None recommended by PT    Recommendations for Other Services       Precautions / Restrictions Precautions Precautions: Fall      Mobility  Bed Mobility Overal bed mobility: Needs Assistance Bed Mobility: Supine to Sit     Supine to sit: Supervision        Transfers Overall transfer level: Needs assistance Equipment used: None Transfers: Sit to/from Stand Sit to Stand: Min guard         General transfer comment: pt reports dizziness upon initial standing, states this happens occasionally, educated to sit down and wait for resolve and/or stand and wait to resolve prior to ambulating  Ambulation/Gait Ambulation/Gait assistance: Min guard Gait Distance (Feet): 200 Feet Assistive device: IV Pole Gait Pattern/deviations: Step-through pattern;Decreased stride length;Drifts right/left     General Gait Details: occasional unsteadiness during ambulation however  pt self corrected, utilized IV pole for support, educated pt to use SPC or RW upon d/c for safety and 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)       Balance Overall balance assessment: Mild deficits observed, not formally tested(denies falls)                                           Pertinent Vitals/Pain Pain Assessment: 0-10 Pain Score: 5  Pain Location: across upper abdomen and across mid back Pain Descriptors / Indicators: Sharp Pain Intervention(s): Limited activity within patient's tolerance;Repositioned;RN gave pain meds during session    Home Living Family/patient expects to be discharged to:: Private residence Living Arrangements: Alone Available Help at Discharge: Family   Home Access: Level entry     Home Layout: One level Home Equipment: Environmental consultant - 2 wheels;Cane - single point Additional Comments: plans to go to her cousins upon d/c    Prior Function Level of Independence: Independent               Hand Dominance        Extremity/Trunk Assessment        Lower Extremity Assessment Lower Extremity Assessment: Overall WFL for tasks assessed       Communication   Communication: No difficulties  Cognition Arousal/Alertness: Awake/alert Behavior During Therapy: WFL for tasks assessed/performed Overall Cognitive Status: Within Functional Limits for tasks assessed  General Comments      Exercises     Assessment/Plan    PT Assessment Patient needs continued PT services  PT Problem List Decreased strength;Decreased mobility;Decreased balance;Decreased knowledge of use of DME;Decreased activity tolerance       PT Treatment Interventions Gait training;Therapeutic exercise;DME instruction;Therapeutic activities;Functional mobility training;Balance training;Patient/family education    PT Goals (Current goals  can be found in the Care Plan section)  Acute Rehab PT Goals PT Goal Formulation: With patient Time For Goal Achievement: 12/22/17 Potential to Achieve Goals: Good    Frequency Min 3X/week   Barriers to discharge        Co-evaluation               AM-PAC PT "6 Clicks" Daily Activity  Outcome Measure Difficulty turning over in bed (including adjusting bedclothes, sheets and blankets)?: None Difficulty moving from lying on back to sitting on the side of the bed? : A Little Difficulty sitting down on and standing up from a chair with arms (e.g., wheelchair, bedside commode, etc,.)?: A Little Help needed moving to and from a bed to chair (including a wheelchair)?: A Little Help needed walking in hospital room?: A Little Help needed climbing 3-5 steps with a railing? : A Lot 6 Click Score: 18    End of Session Equipment Utilized During Treatment: Gait belt Activity Tolerance: Patient tolerated treatment well Patient left: in bed;with nursing/sitter in room;with call bell/phone within reach   PT Visit Diagnosis: Difficulty in walking, not elsewhere classified (R26.2)    Time: 9379-0240 PT Time Calculation (min) (ACUTE ONLY): 17 min   Charges:   PT Evaluation $PT Eval Low Complexity: 1 Low     PT G CodesCarmelia Bake, PT, DPT 12/08/2017 Pager: [phone]  York Ram E 12/08/2017, 12:56 PM

## 2017-12-08 NOTE — Progress Notes (Signed)
PROGRESS NOTE    Shelly Obrien  PHX:[id number] DOB: 1922/09/26 DOA: 12/04/2017 PCP: [name], MD   Brief Narrative:  [name] a 84 y.o.femalewith medical history significant ofHTN, HLD, PAF s/pcardioversionon Elquis, CAD s/p stents,CKD, and nephrolithiasis; who presents withcomplaints of right flank and abdominal  pain over the week. She was found to have acute cholecystitis. Surgery consulted and she is scheduled to have surgery on Friday , 48 hours for her eliquis to wash out of her system. She underwent lap cholecystectomy on 7/5 and is currently on soft diet.    Assessment & Plan:   Principal Problem:   Abdominal pain Active Problems:   Essential hypertension   CAD (coronary artery disease)   CKD (chronic kidney disease) stage 3, GFR 30-59 ml/min (HCC)   Paroxysmal atrial fibrillation (HCC)   Cholelithiasis   Nephrolithiasis   Right upper quadrant pain secondary to acute cholecystitis: Surgery consulted and recommendations given. Underwent lap cholecystectomy without any issues.   Based off the geriatric sensitive perioperative cardiac risk assessment patient probability of perioperative myocardial infarction or cardiac arrest is 0.5-1.3%. Advanced diet by surgery.  She denies nausea, but reports abdominal pain, in the left upper quadrant, she reports the pain is similar to gas pains.  Changed pain meds and monitor.  No bowel movement since 3 days.      Cardiology consulted for pre op clearance and to see if she needs additional testing prior to the procedure. No further testing is required.  Pain control and gentle hydration.  ALK PHOS normal. Liver function panel is normal.   She remains afebrile.     Anemia;  Hemoglobin from 12 to 10.8.  Anemia panel showed low ferritin. But normal iron levels.  b12 shows 287.   Paroxysmal atrial fib with good rate control.  Resume cardizem and eliquis.    Hypertension  Controlled this morning. [name]      CAD with stent to LAD in the past.  Currently denies any chest pain or sob.    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   Constipation:  No BM since three days, ordered senna, colace and suppository.     DVT prophylaxis:Eliquis.  Code Status: full code.  Family Communication: none at bedside.  Disposition Plan: Home tomorrow is pain is improved.   Consultants:   Surgery  Cardiology for pre op clearance.    Procedures:lap cholecystectomy. [name]   Antimicrobials:none.    Subjective: Abdominal pain is back and in the left upper quadrant. No nausea, or vomiting.   Objective: Vitals:   12/07/17 2024 12/08/17 0454 12/08/17 1059 12/08/17 1210  BP: 137/83 (!) 155/91 138/78 (!) 146/82  Pulse: 71 69 66 69  Resp: _0 Temp: 98 F (36.7 C) 97.6 F (36.4 C)  98 F (36.7 C)  TempSrc: Oral Oral  Oral  SpO2: 95% 97%  96%  Weight:      Height:        Intake/Output Summary (Last 24 hours) at 12/08/2017 1232 Last data filed at 12/07/2017 1800 Gross per 24 hour  Intake 400 ml  Output 150 ml  Net 250 ml   Filed Weights   12/04/17 0126 12/04/17 0616  Weight: 72.6 kg (160 lb) 74 kg (163 lb 2.3 oz)    Examination:  General exam: Appears calm and comfortable not in distress. Just restless.  Respiratory system: good air entry bilateral.  Cardiovascular system: S1 & S2 heard, irregular.  Gastrointestinal  system: Abdomen is soft tender in the left upper quadrant, no signs of peritonitis. , ND BS+ Central nervous system: Alert and oriented. No focal neurological deficits. Extremities: Symmetric 5 x 5 power. Skin: No rashes, lesions or ulcers Psychiatry: . Mood & affect appropriate.     Data Reviewed: I have personally reviewed following labs and imaging studies  CBC: Recent Labs  Lab 12/04/17 0219 12/06/17 0440 12/07/17 0408 12/08/17 0326  WBC 5.7 4.1 6.7 8.8  NEUTROABS 3.2  --   --   --   HGB 12.3 11.9* 12.5 10.8*  HCT 39.7 38.7 40.9  35.1*  MCV 88.2 87.8 88.5 89.1  PLT 208 201 228 242   Basic Metabolic Panel: Recent Labs  Lab 12/04/17 0219 12/05/17 0420 12/08/17 0326  NA 142 140 144  K 4.1 4.0 3.8  CL 107 106 110  CO2 _0 GLUCOSE 116* 95 120*  BUN _1 CREATININE 0.89 0.77 0.84  CALCIUM 9.5 9.5 9.2   GFR: Estimated Creatinine Clearance: 42.6 mL/min (by C-G formula based on SCr of 0.84 mg/dL). Liver Function Tests: Recent Labs  Lab 12/04/17 0219 12/05/17 0420  AST 25 21  ALT 15 14  ALKPHOS 135* 117  BILITOT 0.6 1.0  PROT 7.0 6.1*  ALBUMIN 3.9 3.3*   Recent Labs  Lab 12/04/17 0219  LIPASE 28   No results for input(s): AMMONIA in the last 168 hours. Coagulation Profile: Recent Labs  Lab 12/04/17 0341 12/04/17 1152  INR 1.26 1.15   Cardiac Enzymes: No results for input(s): CKTOTAL, CKMB, CKMBINDEX, TROPONINI in the last 168 hours. BNP (last 3 results) No results for input(s): PROBNP in the last 8760 hours. HbA1C: No results for input(s): HGBA1C in the last 72 hours. CBG: No results for input(s): GLUCAP in the last 168 hours. Lipid Profile: No results for input(s): CHOL, HDL, LDLCALC, TRIG, CHOLHDL, LDLDIRECT in the last 72 hours. Thyroid Function Tests: No results for input(s): TSH, T4TOTAL, FREET4, T3FREE, THYROIDAB in the last 72 hours. Anemia Panel: Recent Labs    12/08/17 1013  VITAMINB12 287  FOLATE 9.4  FERRITIN 10*  TIBC 414  IRON 33  RETICCTPCT 1.6   Sepsis Labs: No results for input(s): PROCALCITON, LATICACIDVEN in the last 168 hours.  Recent Results (from the past 240 hour(s))  Surgical pcr screen     Status: None   Collection Time: 12/06/17  4:55 AM  Result Value Ref Range Status   MRSA, PCR NEGATIVE NEGATIVE Final   Staphylococcus aureus NEGATIVE NEGATIVE Final    Comment: (NOTE) The Xpert SA Assay (FDA approved for NASAL specimens in patients 78 years of age and older), is one component of a comprehensive surveillance program. It is not intended  to diagnose infection nor to guide or monitor treatment. Performed at [facility], [place] [address], [place], [place] [address]          Radiology Studies: No results found.      Scheduled Meds: . apixaban  5 mg Oral BID  . diltiazem  120 mg Oral BID  . docusate sodium  100 mg Oral BID  . [START ON 12/09/2017] pantoprazole  40 mg Oral Daily  . sodium chloride flush  3 mL Intravenous Q12H   Continuous Infusions:    LOS: 4 days    Time spent: 35 min    Hosie Poisson, MD [facility] Pager [phone]   If 7PM-7AM, please contact night-coverage [email or web address] Password [facility] 12/08/2017, 12:32 PM

## 2017-12-08 NOTE — Progress Notes (Signed)
2 Days Post-Op   Subjective/Chief Complaint: Complains of pain, similar to what she had before surgery in bilateral subcostal area.    Objective: Vital signs in last 24 hours: Temp:  [97.6 F (36.4 C)-98 F (36.7 C)] 97.6 F (36.4 C) (07/07 0454) Pulse Rate:  [68-75] 69 (07/07 0454) Resp:  [14-16] 16 (07/07 0454) BP: (115-155)/(70-91) 155/91 (07/07 0454) SpO2:  [94 %-97 %] 97 % (07/07 0454) Last BM Date: 12/03/17  Intake/Output from previous day: 07/06 0701 - 07/07 0700 In: 758 [P.O.:240; I.V.:518] Out: 150 [Urine:150] Intake/Output this shift: No intake/output data recorded.  General appearance: alert and cooperative Resp: clear to auscultation bilaterally Cardio: no pedal edema GI: soft, non-tender; bowel sounds normal; no masses,  no organomegaly Extremities: extremities normal, atraumatic, no cyanosis or edema Neurologic: Grossly normal Incision/Wound: c/d/i with dermabond  Lab Results:  Recent Labs    12/07/17 0408 12/08/17 0326  WBC 6.7 8.8  HGB 12.5 10.8*  HCT 40.9 35.1*  PLT 228 211   BMET Recent Labs    12/08/17 0326  NA 144  K 3.8  CL 110  CO2 28  GLUCOSE 120*  BUN 18  CREATININE 0.84  CALCIUM 9.2   PT/INR No results for input(s): LABPROT, INR in the last 72 hours. ABG No results for input(s): PHART, HCO3 in the last 72 hours.  Invalid input(s): PCO2, PO2  Studies/Results: No results found.  Anti-infectives: Anti-infectives (From admission, onward)   Start     Dose/Rate Route Frequency Ordered Stop   12/06/17 0600  clindamycin (CLEOCIN) IVPB 900 mg     900 mg 100 mL/hr over 30 Minutes Intravenous On call to O.R. 12/05/17 0840 12/06/17 1327   12/06/17 0600  gentamicin (GARAMYCIN) 370 mg in dextrose 5 % 100 mL IVPB  Status:  Discontinued     5 mg/kg  74 kg 109.3 mL/hr over 60 Minutes Intravenous On call to O.R. 12/05/17 0840 12/06/17 1519      Assessment/Plan: s/p Procedure(s): LAPAROSCOPIC CHOLECYSTECTOMY (N/A) Pain again-  could be post op pain. If persistent could consider GI cs for endoscopy Has not had a bm will order suppository Hgb has dropped a couple points on eliquis Would not DC yet  LOS: 4 days    Shelly Obrien 12/08/2017

## 2017-12-09 ENCOUNTER — Inpatient Hospital Stay (HOSPITAL_COMMUNITY): Payer: Medicare Other

## 2017-12-09 LAB — HEMOGLOBIN AND HEMATOCRIT, BLOOD
HCT: 38.8 % (ref 36.0–46.0)
Hemoglobin: 12.1 g/dL (ref 12.0–15.0)

## 2017-12-09 MED ORDER — FLEET ENEMA 7-19 GM/118ML RE ENEM: 1.0000 | ENEMA | Freq: Once | RECTAL | Status: DC

## 2017-12-09 MED ORDER — MINERAL OIL RE ENEM
1.0000 | ENEMA | Freq: Once | RECTAL | Status: AC
  Administered 2017-12-09: 1 via RECTAL
  Filled 2017-12-09: qty 1

## 2017-12-09 MED ORDER — POLYETHYLENE GLYCOL 3350 17 G PO PACK
17.0000 g | PACK | Freq: Every day | ORAL | Status: DC
  Administered 2017-12-09 – 2017-12-11 (×3): 17 g via ORAL
  Filled 2017-12-09 (×3): qty 1

## 2017-12-09 MED ORDER — MAGNESIUM HYDROXIDE 400 MG/5ML PO SUSP
30.0000 mL | Freq: Every day | ORAL | Status: DC
  Administered 2017-12-09 – 2017-12-11 (×3): 30 mL via ORAL
  Filled 2017-12-09 (×3): qty 30

## 2017-12-09 NOTE — Progress Notes (Signed)
PROGRESS NOTE    Shelly Obrien  OQH:[id number] DOB: October 01, 1922 DOA: 12/04/2017 PCP: [name], MD   Brief Narrative:  [name] a 84 y.o.femalewith medical history significant ofHTN, HLD, PAF s/pcardioversionon Elquis, CAD s/p stents,CKD, and nephrolithiasis; who presents withcomplaints of right flank and abdominal  pain over the week. She was found to have acute cholecystitis. Surgery consulted and she is scheduled to have surgery on Friday , 48 hours for her eliquis to wash out of her system. She underwent lap cholecystectomy on 7/5 and is currently on soft diet.    Assessment & Plan:   Principal Problem:   Acute on chronic cholecystitis s/p lap cholecystectomy 12/06/2017 Active Problems:   Essential hypertension   CAD (coronary artery disease)   CKD (chronic kidney disease) stage 3, GFR 30-59 ml/min (HCC)   Paroxysmal atrial fibrillation (HCC)   Nephrolithiasis   Abdominal pain   Right upper quadrant pain secondary to acute cholecystitis: Surgery consulted and recommendations given. Underwent lap cholecystectomy without any issues.   Based off the geriatric sensitive perioperative cardiac risk assessment patient probability of perioperative myocardial infarction or cardiac arrest is 0.5-1.3%. Advanced diet by surgery.  She denies nausea, but reports abdominal pain, in the left upper quadrant, she reports the pain is similar to gas pains.  ABD X RAY showed moderate stool burden. Added MOM.  in addition to senna and colace.  Pain control and gentle hydration.  ALK PHOS normal. Liver function panel is normal.   She remains afebrile.     Anemia;  Hemoglobin stable aroun 12.  Anemia panel showed low ferritin. But normal iron levels.  b12 shows 287.   Paroxysmal atrial fib with good rate control.  Resume cardizem and eliquis.    Hypertension  Controlled this morning. Shelly Obrien     CAD with stent to LAD in the past.  Currently denies any chest pain or sob.     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   Constipation:  No BM since three days, ordered senna, colace and suppository.  Added milk of magnesia    DVT prophylaxis:Eliquis.  Code Status: full code.  Family Communication: none at bedside.  Disposition Plan: Home tomorrow is pain is improved.   Consultants:   Surgery  Cardiology for pre op clearance.    Procedures:lap cholecystectomy. Shelly Obrien   Antimicrobials:none.    Subjective: Abdominal pain is back and in the left upper quadrant. No nausea, or vomiting.   Objective: Vitals:   12/08/17 1059 12/08/17 1210 12/08/17 2021 12/09/17 0437  BP: 138/78 (!) 146/82 (!) 167/98 (!) 158/95  Pulse: 66 69 65 70  Resp:  '17 16 18  '$ Temp:  98 F (36.7 C) 97.9 F (36.6 C) 97.6 F (36.4 C)  TempSrc:  Oral Oral Oral  SpO2:  96% 94% 95%  Weight:      Height:        Intake/Output Summary (Last 24 hours) at 12/09/2017 1309 Last data filed at 12/09/2017 1034 Gross per 24 hour  Intake 363 ml  Output -  Net 363 ml   Filed Weights   12/04/17 0126 12/04/17 0616  Weight: 72.6 kg (160 lb) 74 kg (163 lb 2.3 oz)    Examination:  General exam: Appears calm and comfortable not in distress. Just restless.  Respiratory system: good air entry bilateral.  Cardiovascular system: S1 & S2 heard, irregular.  Gastrointestinal system: Abdomen is soft mild tenderness, mildly distended . Bowel sounds heard.  Central  nervous system: Alert and oriented. Non focal.  Extremities: Symmetric 5 x 5 power. Skin: No rashes, lesions or ulcers Psychiatry: . Mood & affect appropriate.     Data Reviewed: I have personally reviewed following labs and imaging studies  CBC: Recent Labs  Lab 12/04/17 0219 12/06/17 0440 12/07/17 0408 12/08/17 0326 12/09/17 0907  WBC 5.7 4.1 6.7 8.8  --   NEUTROABS 3.2  --   --   --   --   HGB 12.3 11.9* 12.5 10.8* 12.1  HCT 39.7 38.7 40.9 35.1* 38.8  MCV 88.2 87.8 88.5 89.1  --   PLT 208 201 228  211  --    Basic Metabolic Panel: Recent Labs  Lab 12/04/17 0219 12/05/17 0420 12/08/17 0326  NA 142 140 144  K 4.1 4.0 3.8  CL 107 106 110  CO2 '26 26 28  '$ GLUCOSE 116* 95 120*  BUN '16 11 18  '$ CREATININE 0.89 0.77 0.84  CALCIUM 9.5 9.5 9.2   GFR: Estimated Creatinine Clearance: 42.6 mL/min (by C-G formula based on SCr of 0.84 mg/dL). Liver Function Tests: Recent Labs  Lab 12/04/17 0219 12/05/17 0420  AST 25 21  ALT 15 14  ALKPHOS 135* 117  BILITOT 0.6 1.0  PROT 7.0 6.1*  ALBUMIN 3.9 3.3*   Recent Labs  Lab 12/04/17 0219  LIPASE 28   No results for input(s): AMMONIA in the last 168 hours. Coagulation Profile: Recent Labs  Lab 12/04/17 0341 12/04/17 1152  INR 1.26 1.15   Cardiac Enzymes: No results for input(s): CKTOTAL, CKMB, CKMBINDEX, TROPONINI in the last 168 hours. BNP (last 3 results) No results for input(s): PROBNP in the last 8760 hours. HbA1C: No results for input(s): HGBA1C in the last 72 hours. CBG: No results for input(s): GLUCAP in the last 168 hours. Lipid Profile: No results for input(s): CHOL, HDL, LDLCALC, TRIG, CHOLHDL, LDLDIRECT in the last 72 hours. Thyroid Function Tests: No results for input(s): TSH, T4TOTAL, FREET4, T3FREE, THYROIDAB in the last 72 hours. Anemia Panel: Recent Labs    12/08/17 1013  VITAMINB12 287  FOLATE 9.4  FERRITIN 10*  TIBC 414  IRON 33  RETICCTPCT 1.6   Sepsis Labs: No results for input(s): PROCALCITON, LATICACIDVEN in the last 168 hours.  Recent Results (from the past 240 hour(s))  Surgical pcr screen     Status: None   Collection Time: 12/06/17  4:55 AM  Result Value Ref Range Status   MRSA, PCR NEGATIVE NEGATIVE Final   Staphylococcus aureus NEGATIVE NEGATIVE Final    Comment: (NOTE) The Xpert SA Assay (FDA approved for NASAL specimens in patients 103 years of age and older), is one component of a comprehensive surveillance program. It is not intended to diagnose infection nor to guide or  monitor treatment. Performed at [facility], [place] [address], [place], [place] [address]          Radiology Studies: Dg Abd 2 Views  Result Date: 12/09/2017 CLINICAL DATA:  Hypertension.  Left upper quadrant abdominal pain. EXAM: ABDOMEN - 2 VIEW COMPARISON:  Ultrasound 12/04/2017.  CT 11/30/2017. FINDINGS: There is a large amount of fecal matter within the colon which could go along with constipation. Small renal calculi seen by CT are not visible. Benign soft tissue calcification in the pelvis is unchanged. Previous cholecystectomy clips are noted. Chronic curvature in degenerative change of the spine as seen previously. IMPRESSION: Large amount of fecal matter within the colon consistent with constipation. No other acute or significant  finding. Electronically Signed   By: Nelson Chimes M.D.   On: 12/09/2017 09:55        Scheduled Meds: . apixaban  5 mg Oral BID  . diltiazem  120 mg Oral BID  . magnesium hydroxide  30 mL Oral Daily  . pantoprazole  40 mg Oral Daily  . polyethylene glycol  17 g Oral Daily  . senna-docusate  2 tablet Oral BID  . sodium chloride flush  3 mL Intravenous Q12H   Continuous Infusions:    LOS: 5 days    Time spent: 35 min    Hosie Poisson, MD [facility] Pager [phone]   If 7PM-7AM, please contact night-coverage [email or web address] Password [email or web address] 12/09/2017, 1:09 PM

## 2017-12-09 NOTE — Care Management Important Message (Signed)
Important Message  Patient Details  Name: Shelly Obrien MRN: [id number] Date of Birth: 04-15-1923   Medicare Important Message Given:  Yes    Kerin Salen 12/09/2017, 2:11 Lane Message  Patient Details  Name: Shelly Obrien MRN: [id number] Date of Birth: 1922/08/19   Medicare Important Message Given:  Yes    Kerin Salen 12/09/2017, 2:11 PM

## 2017-12-09 NOTE — Progress Notes (Signed)
[facility] Progress Note  3 Days Post-Op  Subjective: CC: abdominal pain and constipation Patient reports abdominal pain in upper abdomen but denies soreness at incisions. Passing small amount flatus but no BM yet, although 2 BM recorded in chart. Abdomen feels bloated and appetite is decreased.   Objective: Vital signs in last 24 hours: Temp:  [97.6 F (36.4 C)-98 F (36.7 C)] 97.6 F (36.4 C) (07/08 0437) Pulse Rate:  [65-70] 70 (07/08 0437) Resp:  [16-18] 18 (07/08 0437) BP: (138-167)/(78-98) 158/95 (07/08 0437) SpO2:  [94 %-96 %] 95 % (07/08 0437) Last BM Date: 12/03/17  Intake/Output from previous day: 07/07 0701 - 07/08 0700 In: 60 [P.O.:60] Out: -  Intake/Output this shift: No intake/output data recorded.  PE: Gen:  Alert, NAD, pleasant Card:  Regular rate and rhythm, pedal pulses 2+ BL Pulm:  Normal effort, clear to auscultation bilaterally Abd: Soft, non-tender, non-distended, bowel sounds present but hypoactive, no HSM, incisions C/D/I Skin: warm and dry, no rashes  Psych: A&Ox3   Lab Results:  Recent Labs    12/07/17 0408 12/08/17 0326  WBC 6.7 8.8  HGB 12.5 10.8*  HCT 40.9 35.1*  PLT 228 211   BMET Recent Labs    12/08/17 0326  NA 144  K 3.8  CL 110  CO2 28  GLUCOSE 120*  BUN 18  CREATININE 0.84  CALCIUM 9.2   PT/INR No results for input(s): LABPROT, INR in the last 72 hours. CMP     Component Value Date/Time   NA 144 12/08/2017 0326   NA 143 01/10/2017 1437   K 3.8 12/08/2017 0326   CL 110 12/08/2017 0326   CO2 28 12/08/2017 0326   GLUCOSE 120 (H) 12/08/2017 0326   BUN 18 12/08/2017 0326   BUN 14 01/10/2017 1437   CREATININE 0.84 12/08/2017 0326   CREATININE 1.01 (H) 05/14/2016 0944   CALCIUM 9.2 12/08/2017 0326   PROT 6.1 (L) 12/05/2017 0420   ALBUMIN 3.3 (L) 12/05/2017 0420   AST 21 12/05/2017 0420   ALT 14 12/05/2017 0420   ALKPHOS 117 12/05/2017 0420   BILITOT 1.0 12/05/2017 0420   GFRNONAA 58 (L)  12/08/2017 0326   GFRAA >60 12/08/2017 0326   Lipase     Component Value Date/Time   LIPASE 28 12/04/2017 0219       Studies/Results: No results found.  Anti-infectives: Anti-infectives (From admission, onward)   Start     Dose/Rate Route Frequency Ordered Stop   12/06/17 0600  clindamycin (CLEOCIN) IVPB 900 mg     900 mg 100 mL/hr over 30 Minutes Intravenous On call to O.R. 12/05/17 0840 12/06/17 1327   12/06/17 0600  gentamicin (GARAMYCIN) 370 mg in dextrose 5 % 100 mL IVPB  Status:  Discontinued     5 mg/kg  74 kg 109.3 mL/hr over 60 Minutes Intravenous On call to O.R. 12/05/17 0840 12/06/17 1519       Assessment/Plan Hx of CAD CKD Pancreatic cyst Paroxysmal atrial fibrillation - eliquis and cardizem resumed ABL anemia - Hgb dropped yesterday to 10.8 from 12.5, rechecking H/H today  Symptomatic cholelithiasis - s/p lap chole with umbilical hernia repair 3/0/86 Dr. Marlou Starks - POD#3 - agree with KUB - mobilize as tolerated - surgical pain seems well controlled  FEN: reg diet VTE: SCDs, eliquis ID: gentamicin/clindamycin periop Follow Up: [facility]  LOS: 5 days    Brigid Re , [facility] 12/09/2017, 9:12 AM Pager: [phone] Consults: [phone] Mon-Fri 7:00 am-4:30 pm  Sat-Sun 7:00 am-11:30 am

## 2017-12-09 NOTE — Discharge Instructions (Signed)
[facility] ______[facility] LAPAROSCOPIC SURGERY: POST OP INSTRUCTIONS Always review your discharge instruction sheet given to you by the facility where your surgery was performed. IF YOU HAVE DISABILITY OR FAMILY LEAVE FORMS, YOU MUST BRING THEM TO THE OFFICE FOR PROCESSING.   DO NOT GIVE THEM TO YOUR DOCTOR.  1. A prescription for pain medication may be given to you upon discharge.  Take your pain medication as prescribed, if needed.  If narcotic pain medicine is not needed, then you may take acetaminophen (Tylenol) or ibuprofen (Advil) as needed. 2. Take your usually prescribed medications unless otherwise directed. 3. If you need a refill on your pain medication, please contact your pharmacy.  They will contact our office to request authorization. Prescriptions will not be filled after 5pm or on week-ends. 4. You should follow a light diet the first few days after arrival home, such as soup and crackers, etc.  Be sure to include lots of fluids daily. 5. Most patients will experience some swelling and bruising in the area of the incisions.  Ice packs will help.  Swelling and bruising can take several days to resolve.  6. It is common to experience some constipation if taking pain medication after surgery.  Increasing fluid intake and taking a stool softener (such as Colace) will usually help or prevent this problem from occurring.  A mild laxative (Milk of Magnesia or Miralax) should be taken according to package instructions if there are no bowel movements after 48 hours. 7. Unless discharge instructions indicate otherwise, you may remove your bandages 24-48 hours after surgery, and you may shower at that time.  You may have steri-strips (small skin tapes) in place directly over the incision.  These strips should be left on the skin for 7-10 days.  If your surgeon used skin glue on the incision, you may shower in 24 hours.  The glue will flake off over the next 2-3 weeks.  Any sutures or  staples will be removed at the office during your follow-up visit. 8. ACTIVITIES:  You may resume regular (light) daily activities beginning the next day--such as daily self-care, walking, climbing stairs--gradually increasing activities as tolerated.  You may have sexual intercourse when it is comfortable.  Refrain from any heavy lifting or straining until approved by your doctor. a. You may drive when you are no longer taking prescription pain medication, you can comfortably wear a seatbelt, and you can safely maneuver your car and apply brakes. 9. You should see your doctor in the office for a follow-up appointment approximately 2-3 weeks after your surgery.  Make sure that you call for this appointment within a day or two after you arrive home to insure a convenient appointment time.  WHEN TO CALL YOUR DOCTOR: 1. Fever over 101.0 2. Inability to urinate 3. Continued bleeding from incision. 4. Increased pain, redness, or drainage from the incision. 5. Increasing abdominal pain  The clinic staff is available to answer your questions during regular business hours.  Please dont hesitate to call and ask to speak to one of the nurses for clinical concerns.  If you have a medical emergency, go to the nearest emergency room or call 911.  A surgeon from [facility] is always on call at the hospital. [address], [place], [place], [place]  [address] ? [address] [place], [place], [place]   [address] [phone] ? [phone] ? FAX [phone] Web site: [email or web address]

## 2017-12-10 DIAGNOSIS — K812 Acute cholecystitis with chronic cholecystitis: Secondary | ICD-10-CM

## 2017-12-10 MED ORDER — SENNOSIDES-DOCUSATE SODIUM 8.6-50 MG PO TABS: 2.0000 | ORAL_TABLET | Freq: Two times a day (BID) | ORAL | 0 refills | Status: DC | PRN

## 2017-12-10 MED ORDER — SIMETHICONE 40 MG/0.6ML PO SUSP: 40.0000 mg | Freq: Four times a day (QID) | ORAL | 0 refills | Status: DC | PRN

## 2017-12-10 MED ORDER — BISACODYL 10 MG RE SUPP
10.0000 mg | Freq: Once | RECTAL | Status: AC
  Administered 2017-12-10: 10 mg via RECTAL
  Filled 2017-12-10: qty 1

## 2017-12-10 MED ORDER — FLEET ENEMA 7-19 GM/118ML RE ENEM: 1.0000 | ENEMA | Freq: Every day | RECTAL | Status: DC | PRN

## 2017-12-10 MED ORDER — POLYETHYLENE GLYCOL 3350 17 G PO PACK: 17.0000 g | PACK | Freq: Every day | ORAL | 0 refills | Status: DC

## 2017-12-10 MED ORDER — TRAMADOL HCL 50 MG PO TABS
100.0000 mg | ORAL_TABLET | Freq: Three times a day (TID) | ORAL | Status: DC | PRN
  Administered 2017-12-11: 100 mg via ORAL
  Filled 2017-12-10: qty 2

## 2017-12-10 NOTE — Discharge Summary (Signed)
Physician Discharge Summary  Shelly Obrien NFA:[id number] DOB: Mar 19, 1923 DOA: 12/04/2017  PCP: [name], MD  Admit date: 12/04/2017 Discharge date: 12/11/2017  Admitted From: Home.  Disposition: Home.   Recommendations for Outpatient Follow-up:  1. Follow up with PCP in 1-2 weeks 2. Please obtain BMP/CBC in one week 3. Please follow up  With surgery as needed.    Home Health: yes.    Discharge Condition: stable.  CODE STATUS: full code.  Diet recommendation: Heart Healthy  Brief/Interim Summary: Shelly Boehlke Carteris a 84 y.o.femalewith medical history significant ofHTN, HLD, PAF s/pcardioversionon Elquis, CAD s/p stents,CKD, and nephrolithiasis; who presents withcomplaints of right flank and abdominal  pain over the week. She was found to have acute cholecystitis. Surgery consulted and she is scheduled to have surgery on Friday , 48 hours for her eliquis to wash out of her system. She underwent lap cholecystectomy on 7/5 and is currently on soft diet. She is able to tolerate without any nausea, vomiting or abdominal pain. She was constipated over the last 2 days and, started having BM this morning.     Discharge Diagnoses:  Principal Problem:   Acute on chronic cholecystitis s/p lap cholecystectomy 12/06/2017 Active Problems:   Essential hypertension   CAD (coronary artery disease)   CKD (chronic kidney disease) stage 3, GFR 30-59 ml/min (HCC)   Paroxysmal atrial fibrillation (HCC)   Nephrolithiasis   Abdominal pain  Right upper quadrant pain secondary to acute cholecystitis: Surgery consulted and recommendations given. Underwent lap cholecystectomy without any issues.  Based off the geriatric sensitive perioperative cardiac risk assessment patient probability of perioperative myocardial infarction or cardiac arrest is 0.5-1.3%. Advanced diet by surgery.  ABD X RAY showed moderate stool burden. Added MOM.  in addition to senna and colace.  She started having BM today.  She denies any nausea, vomiting and abdominal discomfort has improved. [name]  ALK PHOS normal. Liver function panel is normal.  She remains afebrile.     Anemia;  Hemoglobin stable aroun 12.  Anemia panel showed low ferritin. But normal iron levels.  b12 shows 287.   Paroxysmal atrial fib with good rate control.  Resume cardizem and eliquis.    Hypertension  Well controlled.    CAD with stent to LAD in the past.  Currently denies any chest pain or sob.    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   Constipation:  No BM since three days, ordered senna, colace and suppository.  Finally started having a BM this am.        Discharge Instructions  Discharge Instructions    Diet - low sodium heart healthy   Complete by:  As directed    Discharge instructions   Complete by:  As directed    Follow up with surgery as recommended.     Allergies as of 12/10/2017      Reactions   Antihistamines, Chlorpheniramine-type Other (See Comments)   Pt states that she passed out.    Penicillins Anaphylaxis, Other (See Comments)   Has patient had a PCN reaction causing immediate rash, facial/tongue/throat swelling, SOB or lightheadedness with hypotension: Yes Has patient had a PCN reaction causing severe rash involving mucus membranes or skin necrosis: No Has patient had a PCN reaction that required hospitalization No Has patient had a PCN reaction occurring within the last 10 years: No If all of the above answers are "NO", then may proceed with Cephalosporin use.   Contrast Media [iodinated Diagnostic  Agents] Hives   Sulfonamide Derivatives Other (See Comments)   Reaction:  Unknown    Atorvastatin Other (See Comments)   Reaction:  Unknown    Pheniramine Other (See Comments)   Pt states that she passed out.       Medication List    TAKE these medications   acetaminophen 325 MG tablet Commonly known as:  TYLENOL Take 325 mg by mouth daily as  needed for mild pain.   diltiazem 120 MG 24 hr capsule Commonly known as:  CARDIZEM CD TAKE ONE CAPSULE BY MOUTH TWICE DAILY   ELIQUIS 5 MG Tabs tablet Generic drug:  apixaban TAKE 1 TABLET BY MOUTH TWICE DAILY   ICAPS AREDS 2 Caps Take 2 capsules by mouth daily.   NASACORT ALLERGY 24HR 55 MCG/ACT Aero nasal inhaler Generic drug:  triamcinolone Place 1-2 sprays into the nose 2 (two) times daily as needed (for rhinitis).   pantoprazole 20 MG tablet Commonly known as:  PROTONIX Take 1 tablet (20 mg total) by mouth daily.   polyethylene glycol packet Commonly known as:  MIRALAX / GLYCOLAX Take 17 g by mouth daily. Start taking on:  12/11/2017   senna-docusate 8.6-50 MG tablet Commonly known as:  Senokot-S Take 2 tablets by mouth 2 (two) times daily as needed for mild constipation.   simethicone 40 MG/0.6ML drops Commonly known as:  MYLICON Take 0.6 mLs (40 mg total) by mouth 4 (four) times daily as needed for flatulence.   SYSTANE OP Place 1 drop into both eyes daily as needed (itchy eyes).   traMADol 50 MG tablet Commonly known as:  ULTRAM Take 1 tablet (50 mg total) by mouth every 6 (six) hours as needed for moderate pain or severe pain. Post-operatively.      Follow-up Information    [facility] Follow up on 12/24/2017.   Specialty:  General Surgery Why:  Your appointment is at 11:00 AM.  Be at the office 30 minutes early for check-in.  Bring your photo ID and insurance information with you. Contact information: [place] [address] [phone]        [name], MD. Schedule an appointment as soon as possible for a visit in 1 week(s).   Specialty:  Internal Medicine Contact information: [address], [address] [place] [address] [phone]        Fay Records, MD .   Specialty:  Cardiology Contact information: [address] [place] [address] [phone]           Allergies  Allergen Reactions  . Antihistamines, Chlorpheniramine-Type Other (See Comments)    Pt states that she passed out.   [name] Penicillins Anaphylaxis and Other (See Comments)    Has patient had a PCN reaction causing immediate rash, facial/tongue/throat swelling, SOB or lightheadedness with hypotension: Yes Has patient had a PCN reaction causing severe rash involving mucus membranes or skin necrosis: No Has patient had a PCN reaction that required hospitalization No Has patient had a PCN reaction occurring within the last 10 years: No If all of the above answers are "NO", then may proceed with Cephalosporin use.  . Contrast Media [Iodinated Diagnostic Agents] Hives  . Sulfonamide Derivatives Other (See Comments)    Reaction:  Unknown   . Atorvastatin Other (See Comments)    Reaction:  Unknown   . Pheniramine Other (See Comments)    Pt states that she passed out.     Consultations:  Surgery  cardiology   Procedures/Studies: Ct Abdomen Pelvis Wo Contrast  Result Date: 11/30/2017 CLINICAL DATA:  Chest and back pain over the past 3 days. EXAM: CT ABDOMEN AND PELVIS WITHOUT CONTRAST TECHNIQUE: Multidetector CT imaging of the abdomen and pelvis was performed following the standard protocol without IV contrast. COMPARISON:  01/20/2017 FINDINGS: Lower chest: Scarring in the right lower lobe and anteriorly in the left lower lobe. Coronary atherosclerosis. Aortic valve calcification. Mild cardiomegaly. There is some contrast medium in the esophagus suggesting reflux or dysmotility. Hepatobiliary: Numerous gallstones measuring up to about 1 cm in diameter. Pancreas: Possible cystic lesion inferiorly along the head of the pancreas, about 1.6 by 1.3 cm on image 43/3 which appears stable back through 12/07/2010. Spleen: Unremarkable Adrenals/Urinary Tract: Adrenal glands normal. There are about 3 nonobstructive right renal calculi each in the 1-2 mm diameter range. There are 2 punctate  calcifications in the left kidney lower pole on image 94/6 which are probably tiny nonobstructive renal calculi, and less likely to be vascular. Suspected left peripelvic cysts. No obstructive stone identified. Urinary bladder unremarkable. Stomach/Bowel: Unremarkable.  Normal appendix. Vascular/Lymphatic: Aortoiliac atherosclerotic vascular disease. No pathologic adenopathy. Reproductive: Uterus absent.  Adnexa unremarkable. Other: No supplemental non-categorized findings. Musculoskeletal: Thoracic and lumbar spondylosis and degenerative disc disease with suspected right foraminal impingement at T12-L1 and L1-2, and left foraminal impingement at L1-2, L2-3, and L3-4. Grade 1 degenerative anterolisthesis at L4-5. IMPRESSION: 1. Bilateral nonobstructive renal calculi. 2. A cause for the patient's chest and back pain is not identified. 3. Lower thoracic and lumbar spondylosis and degenerative disc disease. 4. Cholelithiasis. 5. Small fluid density lesion along the head of the pancreas is not appreciably changed over the past 5 years, and likely benign. 6.  Aortic Atherosclerosis (ICD10-I70.0). Electronically Signed   By: Van Clines M.D.   On: 11/30/2017 16:24   Dg Chest 2 View  Result Date: 11/30/2017 CLINICAL DATA:  Pain beneath both breasts radiating to the back bilaterally for the past 3 days. Some nausea. EXAM: CHEST - 2 VIEW COMPARISON:  06/26/2017. FINDINGS: The cardiac silhouette remains borderline enlarged and the aorta remains tortuous. The pulmonary vasculature and interstitial markings remain mildly prominent. No significant change in linear scarring at both lung bases. Thoracic spine degenerative changes and diffuse osteopenia. IMPRESSION: 1. No acute abnormality. 2. Stable borderline cardiomegaly, mild pulmonary vascular congestion and mild chronic interstitial lung disease. Electronically Signed   By: Claudie Revering M.D.   On: 11/30/2017 09:13   US Abdomen Complete  Result Date:  12/04/2017 CLINICAL DATA:  Right upper quadrant and flank pain EXAM: ABDOMEN ULTRASOUND COMPLETE COMPARISON:  CT 11/30/2017 FINDINGS: Gallbladder: Multiple shadowing stones. Normal wall thickness. No sonographic Percell Miller indicated. Common bile duct: Diameter: 7.3 mm Liver: No focal lesion identified. Within normal limits in parenchymal echogenicity. Portal vein is patent on color Doppler imaging with normal direction of blood flow towards the liver. IVC: No abnormality visualized. Pancreas: 1.9 x 1.2 cm cystic lesion at the head of the pancreas. Spleen: Size and appearance within normal limits. Right Kidney: Length: 11.3 cm. Cortical echogenicity within normal limits. No hydronephrosis. 1.2 cm cyst in the midpole. Left Kidney: Length: 10.3 cm. Echogenicity within normal limits. No mass or hydronephrosis visualized. Abdominal aorta: No aneurysm visualized. Other findings: None. IMPRESSION: 1. Multiple gallstones without definite evidence for acute cholecystitis. Slightly enlarged common bile duct up to 7.3 mm, recommend correlation with LFTs. 2. 1.9 cm cyst at the head of the pancreas 3. Small cyst in the  right kidney Electronically Signed   By: Donavan Foil M.D.   On: 12/04/2017 03:38   Dg Abd 2 Views  Result Date: 12/09/2017 CLINICAL DATA:  Hypertension.  Left upper quadrant abdominal pain. EXAM: ABDOMEN - 2 VIEW COMPARISON:  Ultrasound 12/04/2017.  CT 11/30/2017. FINDINGS: There is a large amount of fecal matter within the colon which could go along with constipation. Small renal calculi seen by CT are not visible. Benign soft tissue calcification in the pelvis is unchanged. Previous cholecystectomy clips are noted. Chronic curvature in degenerative change of the spine as seen previously. IMPRESSION: Large amount of fecal matter within the colon consistent with constipation. No other acute or significant finding. Electronically Signed   By: Nelson Chimes M.D.   On: 12/09/2017 09:55      Subjective: Abdominal  discomfort improved. No nausea or vomiting.   Discharge Exam: Vitals:   12/09/17 2027 12/10/17 0358  BP: 119/75 (!) 146/88  Pulse: 69 68  Resp: 16 16  Temp: 97.8 F (36.6 C) 98.3 F (36.8 C)  SpO2: 94% 95%   Vitals:   12/09/17 0437 12/09/17 1318 12/09/17 2027 12/10/17 0358  BP: (!) 158/95 (!) 148/97 119/75 (!) 146/88  Pulse: 70 66 69 68  Resp: '18 12 16 16  '$ Temp: 97.6 F (36.4 C) 97.8 F (36.6 C) 97.8 F (36.6 C) 98.3 F (36.8 C)  TempSrc: Oral Oral Oral Oral  SpO2: 95% 97% 94% 95%  Weight:      Height:        General: Pt is alert, awake, not in acute distress Cardiovascular: RRR, S1/S2 +, no rubs, no gallops Respiratory: CTA bilaterally, no wheezing, no rhonchi Abdominal: Soft, NT, ND, bowel sounds + Extremities: no edema, no cyanosis    The results of significant diagnostics from this hospitalization (including imaging, microbiology, ancillary and laboratory) are listed below for reference.     Microbiology: Recent Results (from the past 240 hour(s))  Surgical pcr screen     Status: None   Collection Time: 12/06/17  4:55 AM  Result Value Ref Range Status   MRSA, PCR NEGATIVE NEGATIVE Final   Staphylococcus aureus NEGATIVE NEGATIVE Final    Comment: (NOTE) The Xpert SA Assay (FDA approved for NASAL specimens in patients 19 years of age and older), is one component of a comprehensive surveillance program. It is not intended to diagnose infection nor to guide or monitor treatment. Performed at [facility], [place] [address] [place], [place] [address]      Labs: BNP (last 3 results) Recent Labs    06/26/17 1552  BNP 811.0*   Basic Metabolic Panel: Recent Labs  Lab 12/04/17 0219 12/05/17 0420 12/08/17 0326  NA 142 140 144  K 4.1 4.0 3.8  CL 107 106 110  CO2 '26 26 28  '$ GLUCOSE 116* 95 120*  BUN '16 11 18  '$ CREATININE 0.89 0.77 0.84  CALCIUM 9.5 9.5 9.2   Liver Function Tests: Recent Labs  Lab 12/04/17 0219 12/05/17 0420   AST 25 21  ALT 15 14  ALKPHOS 135* 117  BILITOT 0.6 1.0  PROT 7.0 6.1*  ALBUMIN 3.9 3.3*   Recent Labs  Lab 12/04/17 0219  LIPASE 28   No results for input(s): AMMONIA in the last 168 hours. CBC: Recent Labs  Lab 12/04/17 0219 12/06/17 0440 12/07/17 0408 12/08/17 0326 12/09/17 0907  WBC 5.7 4.1 6.7 8.8  --   NEUTROABS 3.2  --   --   --   --  HGB 12.3 11.9* 12.5 10.8* 12.1  HCT 39.7 38.7 40.9 35.1* 38.8  MCV 88.2 87.8 88.5 89.1  --   PLT 208 201 228 211  --    Cardiac Enzymes: No results for input(s): CKTOTAL, CKMB, CKMBINDEX, TROPONINI in the last 168 hours. BNP: Invalid input(s): POCBNP CBG: No results for input(s): GLUCAP in the last 168 hours. D-Dimer No results for input(s): DDIMER in the last 72 hours. Hgb A1c No results for input(s): HGBA1C in the last 72 hours. Lipid Profile No results for input(s): CHOL, HDL, LDLCALC, TRIG, CHOLHDL, LDLDIRECT in the last 72 hours. Thyroid function studies No results for input(s): TSH, T4TOTAL, T3FREE, THYROIDAB in the last 72 hours.  Invalid input(s): FREET3 Anemia work up Recent Labs    12/08/17 1013  VITAMINB12 287  FOLATE 9.4  FERRITIN 10*  TIBC 414  IRON 33  RETICCTPCT 1.6   Urinalysis    Component Value Date/Time   COLORURINE YELLOW 11/30/2017 1358   APPEARANCEUR CLEAR 11/30/2017 1358   LABSPEC 1.011 11/30/2017 1358   PHURINE 6.0 11/30/2017 1358   GLUCOSEU NEGATIVE 11/30/2017 1358   HGBUR NEGATIVE 11/30/2017 1358   [place] 11/30/2017 1358   [place] 11/30/2017 1358   PROTEINUR NEGATIVE 11/30/2017 1358   UROBILINOGEN 2.0 (H) 01/30/2016 1307   NITRITE NEGATIVE 11/30/2017 1358   LEUKOCYTESUR NEGATIVE 11/30/2017 1358   Sepsis Labs Invalid input(s): PROCALCITONIN,  WBC,  LACTICIDVEN Microbiology Recent Results (from the past 240 hour(s))  Surgical pcr screen     Status: None   Collection Time: 12/06/17  4:55 AM  Result Value Ref Range Status   MRSA, PCR NEGATIVE NEGATIVE  Final   Staphylococcus aureus NEGATIVE NEGATIVE Final    Comment: (NOTE) The Xpert SA Assay (FDA approved for NASAL specimens in patients 69 years of age and older), is one component of a comprehensive surveillance program. It is not intended to diagnose infection nor to guide or monitor treatment. Performed at [facility], [place] [address], [place], [place] [address]      Time coordinating discharge: 34 minutes  SIGNED:   Hosie Poisson, MD  [facility] 12/10/2017, 1:17 PM Pager   If 7PM-7AM, please contact night-coverage [email or web address] Password [email or web address]

## 2017-12-10 NOTE — Progress Notes (Signed)
PHARMACY NOTE -  Eliquis  Pharmacy has been assisting with dosing of Eliquis for Afib.  Dosage remains stable at 5 mg po BID and need for further dosage adjustment appears unlikely at present given stable renal function and CBC  Pharmacy will sign off, following peripherally for culture results or dose adjustments. Please reconsult if a change in clinical status warrants re-evaluation of dosage.  Reuel Boom, PharmD, BCPS [phone] 12/10/2017, 12:29 PM

## 2017-12-10 NOTE — Progress Notes (Signed)
[facility] Progress Note  4 Days Post-Op  Subjective: CC: constipation Abdominal pain improving. Occasional nausea but no emesis. She thinks she would feel much better if she could have a BM. Ambulating in halls. Took enema yesterday without success.  Objective: Vital signs in last 24 hours: Temp:  [97.8 F (36.6 C)-98.3 F (36.8 C)] 98.3 F (36.8 C) (07/09 0358) Pulse Rate:  [66-69] 68 (07/09 0358) Resp:  [12-16] 16 (07/09 0358) BP: (119-148)/(75-97) 146/88 (07/09 0358) SpO2:  [94 %-97 %] 95 % (07/09 0358) Last BM Date: 12/03/17  Intake/Output from previous day: 07/08 0701 - 07/09 0700 In: 746 [P.O.:740; I.V.:6] Out: 250 [Urine:250] Intake/Output this shift: No intake/output data recorded.  PE: Gen:  Alert, NAD, pleasant Card:  Regular rate and rhythm, pedal pulses 2+ BL Pulm:  Normal effort, clear to auscultation bilaterally Abd: Soft, non-tender, non-distended, bowel sounds present but hypoactive, no HSM, incisions C/D/I Skin: warm and dry, no rashes  Psych: A&Ox3     Lab Results:  Recent Labs    12/08/17 0326 12/09/17 0907  WBC 8.8  --   HGB 10.8* 12.1  HCT 35.1* 38.8  PLT 211  --    BMET Recent Labs    12/08/17 0326  NA 144  K 3.8  CL 110  CO2 28  GLUCOSE 120*  BUN 18  CREATININE 0.84  CALCIUM 9.2   PT/INR No results for input(s): LABPROT, INR in the last 72 hours. CMP     Component Value Date/Time   NA 144 12/08/2017 0326   NA 143 01/10/2017 1437   K 3.8 12/08/2017 0326   CL 110 12/08/2017 0326   CO2 28 12/08/2017 0326   GLUCOSE 120 (H) 12/08/2017 0326   BUN 18 12/08/2017 0326   BUN 14 01/10/2017 1437   CREATININE 0.84 12/08/2017 0326   CREATININE 1.01 (H) 05/14/2016 0944   CALCIUM 9.2 12/08/2017 0326   PROT 6.1 (L) 12/05/2017 0420   ALBUMIN 3.3 (L) 12/05/2017 0420   AST 21 12/05/2017 0420   ALT 14 12/05/2017 0420   ALKPHOS 117 12/05/2017 0420   BILITOT 1.0 12/05/2017 0420   GFRNONAA 58 (L) 12/08/2017 0326   GFRAA  >60 12/08/2017 0326   Lipase     Component Value Date/Time   LIPASE 28 12/04/2017 0219       Studies/Results: Dg Abd 2 Views  Result Date: 12/09/2017 CLINICAL DATA:  Hypertension.  Left upper quadrant abdominal pain. EXAM: ABDOMEN - 2 VIEW COMPARISON:  Ultrasound 12/04/2017.  CT 11/30/2017. FINDINGS: There is a large amount of fecal matter within the colon which could go along with constipation. Small renal calculi seen by CT are not visible. Benign soft tissue calcification in the pelvis is unchanged. Previous cholecystectomy clips are noted. Chronic curvature in degenerative change of the spine as seen previously. IMPRESSION: Large amount of fecal matter within the colon consistent with constipation. No other acute or significant finding. Electronically Signed   By: Nelson Chimes M.D.   On: 12/09/2017 09:55    Anti-infectives: Anti-infectives (From admission, onward)   Start     Dose/Rate Route Frequency Ordered Stop   12/06/17 0600  clindamycin (CLEOCIN) IVPB 900 mg     900 mg 100 mL/hr over 30 Minutes Intravenous On call to O.R. 12/05/17 0840 12/06/17 1327   12/06/17 0600  gentamicin (GARAMYCIN) 370 mg in dextrose 5 % 100 mL IVPB  Status:  Discontinued     5 mg/kg  74 kg 109.3 mL/hr over 60 Minutes  Intravenous On call to O.R. 12/05/17 0840 12/06/17 1519       Assessment/Plan Hx of CAD CKD Pancreatic cyst Paroxysmal atrial fibrillation - eliquis and cardizem resumed ABL anemia - Hgb 12.1 yesterday and VSS  Symptomatic cholelithiasis - s/p lap chole with umbilical hernia repair 09/03/34 Dr. Marlou Starks - POD#4 - KUB yesterday showed moderate stool burden - will order dulcolax suppository today and enema if needed - mobilize  - surgical pain seems well controlled  FEN: reg diet VTE: SCDs, eliquis ID: gentamicin/clindamycin periop Follow Up: [facility] clinic  Stable for discharge from a surgical standpoint when having BMs.   LOS: 6 days    Brigid Re , [facility]  Surgery 12/10/2017, 9:49 AM Pager: [phone] Consults: [phone] Mon-Fri 7:00 am-4:30 pm Sat-Sun 7:00 am-11:30 am

## 2017-12-11 DIAGNOSIS — K5900 Constipation, unspecified: Secondary | ICD-10-CM

## 2017-12-11 DIAGNOSIS — I2510 Atherosclerotic heart disease of native coronary artery without angina pectoris: Secondary | ICD-10-CM

## 2017-12-11 MED ORDER — TRAMADOL HCL 50 MG PO TABS: 100.0000 mg | ORAL_TABLET | Freq: Three times a day (TID) | ORAL | 0 refills | Status: DC | PRN

## 2017-12-11 NOTE — Progress Notes (Signed)
Physical Therapy Treatment Patient Details Name: Shelly Obrien MRN: [id number] DOB: [birth year] Today's Date: 12/11/2017    History of Present Illness 84 y.o. female with medical history significant of HTN, HLD, PAF s/p cardioversion on Elquis, CAD s/p stents, CKD, and nephrolithiasis; who presents with complaints of right flank and abdominal  pain over the week. She was found to have acute cholecystitis and underwent lap cholecystectomy on 7/5    PT Comments    Excellent progress with mobility. Pt demonstrates modified independence with bed mobility and transfers. Supervision provided for ambulaiton 250 feet with RW. Pt reports she has been ambulating in hallway using RW without assist. Plan is for d/c to her cousin's house today. All acute PT goals met.    Follow Up Recommendations  Home health PT;Supervision for mobility/OOB     Equipment Recommendations  None recommended by PT    Recommendations for Other Services       Precautions / Restrictions Precautions Precautions: Fall    Mobility  Bed Mobility Overal bed mobility: Modified Independent       Supine to sit: Modified independent (Device/Increase time)        Transfers Overall transfer level: Modified independent Equipment used: Ambulation equipment used   Sit to Stand: Modified independent (Device/Increase time)            Ambulation/Gait Ambulation/Gait assistance: Supervision;Modified independent (Device/Increase time) Gait Distance (Feet): 250 Feet Assistive device: Rolling walker (2 wheeled) Gait Pattern/deviations: Step-through pattern   Gait velocity interpretation: 1.31 - 2.62 ft/sec, indicative of limited community ambulator General Gait Details: steady Radio broadcast assistant    Modified Rankin (Stroke Patients Only)       Balance Overall balance assessment: Mild deficits observed, not formally tested                                           Cognition Arousal/Alertness: Awake/alert Behavior During Therapy: WFL for tasks assessed/performed Overall Cognitive Status: Within Functional Limits for tasks assessed                                        Exercises      General Comments        Pertinent Vitals/Pain Pain Assessment: 0-10 Pain Score: 2  Pain Location: upper abdomen Pain Descriptors / Indicators: Sore Pain Intervention(s): Monitored during session    Home Living                      Prior Function            PT Goals (current goals can now be found in the care plan section) Acute Rehab PT Goals PT Goal Formulation: With patient Time For Goal Achievement: 12/22/17 Potential to Achieve Goals: Good Progress towards PT goals: Progressing toward goals    Frequency    Min 3X/week      PT Plan Current plan remains appropriate    Co-evaluation              AM-PAC PT "6 Clicks" Daily Activity  Outcome Measure  Difficulty turning over in bed (including adjusting bedclothes, sheets and blankets)?: None Difficulty moving from lying on back to sitting on the side of  the bed? : None Difficulty sitting down on and standing up from a chair with arms (e.g., wheelchair, bedside commode, etc,.)?: None Help needed moving to and from a bed to chair (including a wheelchair)?: None Help needed walking in hospital room?: None Help needed climbing 3-5 steps with a railing? : A Little 6 Click Score: 23    End of Session Equipment Utilized During Treatment: Gait belt Activity Tolerance: Patient tolerated treatment well Patient left: in bed;with call bell/phone within reach Nurse Communication: Mobility status PT Visit Diagnosis: Difficulty in walking, not elsewhere classified (R26.2)     Time: 9323-5573 PT Time Calculation (min) (ACUTE ONLY): 11 min  Charges:  $Gait Training: 8-22 mins                    G Codes:       Lorrin Goodell, PT  Office # [phone] Pager  [phone]    Lorriane Shire 12/11/2017, 9:25 AM

## 2017-12-11 NOTE — Plan of Care (Signed)
Pt was able to tolerate ambulation on day shift and had a BM on DS.

## 2017-12-11 NOTE — Plan of Care (Signed)
Discharge instructions reviewed with patient and family member, questions answered, verbalized understanding.  Prescription for Ultram given to patient.  Patient transported via wheelchair to main entrance to be taken home by family member.

## 2017-12-11 NOTE — Progress Notes (Signed)
Spoke with pt concerning HH. Pt is not home bound and states she will not need HH at this present time.

## 2017-12-11 NOTE — Final Consult Note (Signed)
Patient had a BM yesterday. Has been doing well from a surgical standpoint. Stable for discharge home today from a surgical standpoint. Follow up in the chart.  Brigid Re , [facility] Surgery 12/11/2017, 7:31 AM Pager: [phone] Mon-Fri 7:00 am-4:30 pm Sat-Sun 7:00 am-11:30 am

## 2017-12-11 NOTE — Progress Notes (Signed)
PROGRESS NOTE    Shelly Obrien  PPJ:[id number] DOB: 1922/11/05 DOA: 12/04/2017 PCP: [name], MD    Brief Narrative:  Shelly Obrien a 84 y.o.femalewith medical history significant ofHTN, HLD, PAF s/pcardioversionon Elquis, CAD s/p stents,CKD, and nephrolithiasis; who presents withcomplaints of right flank and abdominal pain over the week. She was found to have acute cholecystitis. Surgery consulted and she is scheduled to have surgery on Friday , 48 hours for her eliquis to wash out of her system. She underwent lap cholecystectomy on 7/5 and is currently on soft diet. She is able to tolerate without any nausea, vomiting or abdominal pain. She was constipated over the last 2 days and, started having BM the morning of 12/10/2017.       Assessment & Plan:   Principal Problem:   Acute on chronic cholecystitis s/p lap cholecystectomy 12/06/2017 Active Problems:   Essential hypertension   CAD (coronary artery disease)   CKD (chronic kidney disease) stage 3, GFR 30-59 ml/min (HCC)   Paroxysmal atrial fibrillation (HCC)   Nephrolithiasis   Abdominal pain   #1 acute on chronic cholecystitis status post laparoscopic cholecystectomy 12/06/2017 Patient currently tolerating a diet.  Had bowel movements yesterday.  Patient tolerating oral intake.  No nausea or emesis.  Some complaints of upper abdominal pain.  Patient with milk of magnesia as well as senna and Colace.  Continue Ultram as needed for pain control.  LFTs within normal limits.  Outpatient follow-up with general surgery.  2.  Paroxysmal atrial fibrillation Currently rate controlled on Cardizem.  Eliquis for anticoagulation.  3.  Anemia Stable.  Outpatient follow-up with PCP.  4.  Hypertension Stable.  5.  Coronary artery disease status post stent to the LAD Currently asymptomatic.  Continue cardiac regimen of Cardizem, Eliquis.  6.  Chronic kidney disease stage III Stable.  7.  Constipation Improved on current  regimen of senna Colace milk of magnesia and suppositories.  Patient with bowel movement yesterday.  Tolerating oral intake.  Outpatient follow-up.   DVT prophylaxis: Eliquis Code Status: Full Family Communication updated patient.  No family at bedside. Disposition Plan: Discharge home.   Consultants:   General surgery: Dr. Barry Dienes 12/04/2017  Cardiology: Dr. Domenic Polite 12/05/2017  Procedures:   Abdominal films 12/19/2017  Abdominal ultrasound 12/04/2017  Laparoscopic cholecystectomy per Dr. Marlou Starks III 12/06/2017  Antimicrobials:   None   Subjective: Patient had bowel movement yesterday.  Tolerating oral intake.  No nausea or emesis.  States some upper abdominal pain.  Objective: Vitals:   12/10/17 0358 12/10/17 1323 12/10/17 2037 12/11/17 0431  BP: (!) 146/88 124/77 130/82 (!) 154/95  Pulse: 68 71 73 67  Resp: 16 18 13 10   Temp: 98.3 F (36.8 C) 98 F (36.7 C) 98.4 F (36.9 C) 97.7 F (36.5 C)  TempSrc: Oral Oral Oral Oral  SpO2: 95% 97% 96% 95%  Weight:      Height:        Intake/Output Summary (Last 24 hours) at 12/11/2017 0954 Last data filed at 12/10/2017 1322 Gross per 24 hour  Intake 240 ml  Output -  Net 240 ml   Filed Weights   12/04/17 0126 12/04/17 0616  Weight: 72.6 kg (160 lb) 74 kg (163 lb 2.3 oz)    Examination:  General exam: Appears calm and comfortable  Respiratory system: Clear to auscultation. Respiratory effort normal. Cardiovascular system: S1 & S2 heard, RRR. No JVD, murmurs, rubs, gallops or clicks. No pedal edema. Gastrointestinal system: Abdomen is nondistended, soft  and minimal to mild tenderness to palpation upper abdomen.  Normal bowel sounds.  No rebound.  No guarding.  Central nervous system: Alert and oriented. No focal neurological deficits. Extremities: Symmetric 5 x 5 power. Skin: No rashes, lesions or ulcers Psychiatry: Judgement and insight appear normal. Mood & affect appropriate.     Data Reviewed: I have personally  reviewed following labs and imaging studies  CBC: Recent Labs  Lab 12/06/17 0440 12/07/17 0408 12/08/17 0326 12/09/17 0907  WBC 4.1 6.7 8.8  --   HGB 11.9* 12.5 10.8* 12.1  HCT 38.7 40.9 35.1* 38.8  MCV 87.8 88.5 89.1  --   PLT 201 228 211  --    Basic Metabolic Panel: Recent Labs  Lab 12/05/17 0420 12/08/17 0326  NA 140 144  K 4.0 3.8  CL 106 110  CO2 26 28  GLUCOSE 95 120*  BUN 11 18  CREATININE 0.77 0.84  CALCIUM 9.5 9.2   GFR: Estimated Creatinine Clearance: 42.6 mL/min (by C-G formula based on SCr of 0.84 mg/dL). Liver Function Tests: Recent Labs  Lab 12/05/17 0420  AST 21  ALT 14  ALKPHOS 117  BILITOT 1.0  PROT 6.1*  ALBUMIN 3.3*   No results for input(s): LIPASE, AMYLASE in the last 168 hours. No results for input(s): AMMONIA in the last 168 hours. Coagulation Profile: Recent Labs  Lab 12/04/17 1152  INR 1.15   Cardiac Enzymes: No results for input(s): CKTOTAL, CKMB, CKMBINDEX, TROPONINI in the last 168 hours. BNP (last 3 results) No results for input(s): PROBNP in the last 8760 hours. HbA1C: No results for input(s): HGBA1C in the last 72 hours. CBG: No results for input(s): GLUCAP in the last 168 hours. Lipid Profile: No results for input(s): CHOL, HDL, LDLCALC, TRIG, CHOLHDL, LDLDIRECT in the last 72 hours. Thyroid Function Tests: No results for input(s): TSH, T4TOTAL, FREET4, T3FREE, THYROIDAB in the last 72 hours. Anemia Panel: Recent Labs    12/08/17 1013  VITAMINB12 287  FOLATE 9.4  FERRITIN 10*  TIBC 414  IRON 33  RETICCTPCT 1.6   Sepsis Labs: No results for input(s): PROCALCITON, LATICACIDVEN in the last 168 hours.  Recent Results (from the past 240 hour(s))  Surgical pcr screen     Status: None   Collection Time: 12/06/17  4:55 AM  Result Value Ref Range Status   MRSA, PCR NEGATIVE NEGATIVE Final   Staphylococcus aureus NEGATIVE NEGATIVE Final    Comment: (NOTE) The Xpert SA Assay (FDA approved for NASAL specimens in  patients 73 years of age and older), is one component of a comprehensive surveillance program. It is not intended to diagnose infection nor to guide or monitor treatment. Performed at [facility], [place] [address] [place], [place] [address]          Radiology Studies: No results found.      Scheduled Meds: . apixaban  5 mg Oral BID  . diltiazem  120 mg Oral BID  . magnesium hydroxide  30 mL Oral Daily  . pantoprazole  40 mg Oral Daily  . polyethylene glycol  17 g Oral Daily  . senna-docusate  2 tablet Oral BID  . sodium chloride flush  3 mL Intravenous Q12H   Continuous Infusions:   LOS: 7 days    Time spent: 35 minutes    Irine Seal, MD [facility] Pager [phone]  If 7PM-7AM, please contact night-coverage [email or web address] Password [facility] 12/11/2017, 9:54 AM

## 2017-12-14 ENCOUNTER — Emergency Department (HOSPITAL_COMMUNITY): Payer: Medicare Other

## 2017-12-14 ENCOUNTER — Observation Stay (HOSPITAL_COMMUNITY)
Admission: EM | Admit: 2017-12-14 | Discharge: 2017-12-15 | Disposition: A | Discharge status: Home / Self Care | Payer: Medicare Other | Attending: Internal Medicine | Admitting: Internal Medicine

## 2017-12-14 ENCOUNTER — Other Ambulatory Visit: Payer: Self-pay

## 2017-12-14 ENCOUNTER — Encounter (HOSPITAL_COMMUNITY): Payer: Self-pay | Admitting: Emergency Medicine

## 2017-12-14 DIAGNOSIS — R1013 Epigastric pain: Secondary | ICD-10-CM | POA: Insufficient documentation

## 2017-12-14 DIAGNOSIS — E785 Hyperlipidemia, unspecified: Secondary | ICD-10-CM | POA: Insufficient documentation

## 2017-12-14 DIAGNOSIS — Z9889 Other specified postprocedural states: Secondary | ICD-10-CM | POA: Insufficient documentation

## 2017-12-14 DIAGNOSIS — Z955 Presence of coronary angioplasty implant and graft: Secondary | ICD-10-CM | POA: Insufficient documentation

## 2017-12-14 DIAGNOSIS — Z90710 Acquired absence of both cervix and uterus: Secondary | ICD-10-CM | POA: Insufficient documentation

## 2017-12-14 DIAGNOSIS — I252 Old myocardial infarction: Secondary | ICD-10-CM | POA: Diagnosis not present

## 2017-12-14 DIAGNOSIS — Z9049 Acquired absence of other specified parts of digestive tract: Secondary | ICD-10-CM | POA: Diagnosis not present

## 2017-12-14 DIAGNOSIS — R112 Nausea with vomiting, unspecified: Secondary | ICD-10-CM | POA: Diagnosis not present

## 2017-12-14 DIAGNOSIS — Z85828 Personal history of other malignant neoplasm of skin: Secondary | ICD-10-CM | POA: Insufficient documentation

## 2017-12-14 DIAGNOSIS — K5909 Other constipation: Secondary | ICD-10-CM | POA: Diagnosis not present

## 2017-12-14 DIAGNOSIS — Z880 Allergy status to penicillin: Secondary | ICD-10-CM | POA: Diagnosis not present

## 2017-12-14 DIAGNOSIS — Z7901 Long term (current) use of anticoagulants: Secondary | ICD-10-CM | POA: Diagnosis not present

## 2017-12-14 DIAGNOSIS — K5900 Constipation, unspecified: Secondary | ICD-10-CM | POA: Diagnosis present

## 2017-12-14 DIAGNOSIS — Z808 Family history of malignant neoplasm of other organs or systems: Secondary | ICD-10-CM | POA: Insufficient documentation

## 2017-12-14 DIAGNOSIS — Z91041 Radiographic dye allergy status: Secondary | ICD-10-CM | POA: Diagnosis not present

## 2017-12-14 DIAGNOSIS — I129 Hypertensive chronic kidney disease with stage 1 through stage 4 chronic kidney disease, or unspecified chronic kidney disease: Secondary | ICD-10-CM | POA: Diagnosis not present

## 2017-12-14 DIAGNOSIS — I480 Paroxysmal atrial fibrillation: Secondary | ICD-10-CM | POA: Diagnosis not present

## 2017-12-14 DIAGNOSIS — I700 Atherosclerosis of aorta: Secondary | ICD-10-CM | POA: Insufficient documentation

## 2017-12-14 DIAGNOSIS — N183 Chronic kidney disease, stage 3 (moderate): Secondary | ICD-10-CM | POA: Diagnosis not present

## 2017-12-14 DIAGNOSIS — Z882 Allergy status to sulfonamides status: Secondary | ICD-10-CM | POA: Diagnosis not present

## 2017-12-14 DIAGNOSIS — Z9842 Cataract extraction status, left eye: Secondary | ICD-10-CM | POA: Insufficient documentation

## 2017-12-14 DIAGNOSIS — Z960 Presence of urogenital implants: Secondary | ICD-10-CM | POA: Diagnosis not present

## 2017-12-14 DIAGNOSIS — I2510 Atherosclerotic heart disease of native coronary artery without angina pectoris: Secondary | ICD-10-CM | POA: Diagnosis not present

## 2017-12-14 DIAGNOSIS — R109 Unspecified abdominal pain: Secondary | ICD-10-CM | POA: Diagnosis not present

## 2017-12-14 DIAGNOSIS — Z888 Allergy status to other drugs, medicaments and biological substances status: Secondary | ICD-10-CM | POA: Diagnosis not present

## 2017-12-14 DIAGNOSIS — K5901 Slow transit constipation: Secondary | ICD-10-CM

## 2017-12-14 DIAGNOSIS — H3530 Unspecified macular degeneration: Secondary | ICD-10-CM | POA: Insufficient documentation

## 2017-12-14 DIAGNOSIS — N200 Calculus of kidney: Secondary | ICD-10-CM | POA: Diagnosis not present

## 2017-12-14 DIAGNOSIS — Z9841 Cataract extraction status, right eye: Secondary | ICD-10-CM | POA: Insufficient documentation

## 2017-12-14 DIAGNOSIS — Z79899 Other long term (current) drug therapy: Secondary | ICD-10-CM | POA: Insufficient documentation

## 2017-12-14 DIAGNOSIS — Z800 Family history of malignant neoplasm of digestive organs: Secondary | ICD-10-CM | POA: Insufficient documentation

## 2017-12-14 DIAGNOSIS — Z87442 Personal history of urinary calculi: Secondary | ICD-10-CM | POA: Insufficient documentation

## 2017-12-14 DIAGNOSIS — Z8249 Family history of ischemic heart disease and other diseases of the circulatory system: Secondary | ICD-10-CM | POA: Insufficient documentation

## 2017-12-14 DIAGNOSIS — Z809 Family history of malignant neoplasm, unspecified: Secondary | ICD-10-CM | POA: Insufficient documentation

## 2017-12-14 LAB — URINALYSIS, ROUTINE W REFLEX MICROSCOPIC
Bilirubin Urine: NEGATIVE
Glucose, UA: NEGATIVE mg/dL
Hgb urine dipstick: NEGATIVE
Ketones, ur: NEGATIVE mg/dL
Nitrite: NEGATIVE
Protein, ur: NEGATIVE mg/dL
Specific Gravity, Urine: 1.01 (ref 1.005–1.030)
pH: 7.5 (ref 5.0–8.0)

## 2017-12-14 LAB — URINALYSIS, MICROSCOPIC (REFLEX)
Bacteria, UA: NONE SEEN
RBC / HPF: NONE SEEN RBC/hpf (ref 0–5)

## 2017-12-14 LAB — COMPREHENSIVE METABOLIC PANEL
ALT: 15 U/L (ref 0–44)
AST: 19 U/L (ref 15–41)
Albumin: 3.4 g/dL — ABNORMAL LOW (ref 3.5–5.0)
Alkaline Phosphatase: 123 U/L (ref 38–126)
Anion gap: 10 (ref 5–15)
BUN: 19 mg/dL (ref 8–23)
CO2: 25 mmol/L (ref 22–32)
Calcium: 9.6 mg/dL (ref 8.9–10.3)
Chloride: 104 mmol/L (ref 98–111)
Creatinine, Ser: 1 mg/dL (ref 0.44–1.00)
GFR calc Af Amer: 55 mL/min — ABNORMAL LOW (ref >60)
GFR calc non Af Amer: 47 mL/min — ABNORMAL LOW (ref >60)
Glucose, Bld: 126 mg/dL — ABNORMAL HIGH (ref 70–99)
Potassium: 4 mmol/L (ref 3.5–5.1)
Sodium: 139 mmol/L (ref 135–145)
Total Bilirubin: 0.8 mg/dL (ref 0.3–1.2)
Total Protein: 6.5 g/dL (ref 6.5–8.1)

## 2017-12-14 LAB — CBC
HCT: 41.3 % (ref 36.0–46.0)
HCT: 43.5 % (ref 36.0–46.0)
Hemoglobin: 12.5 g/dL (ref 12.0–15.0)
Hemoglobin: 13.3 g/dL (ref 12.0–15.0)
MCH: 26.8 pg (ref 26.0–34.0)
MCH: 26.9 pg (ref 26.0–34.0)
MCHC: 30.3 g/dL (ref 30.0–36.0)
MCHC: 30.6 g/dL (ref 30.0–36.0)
MCV: 88.6 fL (ref 78.0–100.0)
MCV: 87.9 fL (ref 78.0–100.0)
Platelets: 234 10*3/uL (ref 150–400)
Platelets: 237 10*3/uL (ref 150–400)
RBC: 4.66 MIL/uL (ref 3.87–5.11)
RBC: 4.95 MIL/uL (ref 3.87–5.11)
RDW: 17.3 % — ABNORMAL HIGH (ref 11.5–15.5)
RDW: 17.1 % — ABNORMAL HIGH (ref 11.5–15.5)
WBC: 6.8 10*3/uL (ref 4.0–10.5)
WBC: 5 10*3/uL (ref 4.0–10.5)

## 2017-12-14 LAB — CREATININE, SERUM
Creatinine, Ser: 0.93 mg/dL (ref 0.44–1.00)
GFR calc Af Amer: 60 mL/min — ABNORMAL LOW (ref >60)
GFR calc non Af Amer: 51 mL/min — ABNORMAL LOW (ref >60)

## 2017-12-14 LAB — LIPASE, BLOOD: Lipase: 33 U/L (ref 11–51)

## 2017-12-14 MED ORDER — DIPHENHYDRAMINE HCL 50 MG/ML IJ SOLN
25.0000 mg | Freq: Once | INTRAMUSCULAR | Status: AC
  Administered 2017-12-14: 25 mg via INTRAVENOUS
  Filled 2017-12-14: qty 1

## 2017-12-14 MED ORDER — ONDANSETRON HCL 4 MG/2ML IJ SOLN
4.0000 mg | Freq: Once | INTRAMUSCULAR | Status: AC
  Administered 2017-12-14: 4 mg via INTRAVENOUS
  Filled 2017-12-14: qty 2

## 2017-12-14 MED ORDER — IBUPROFEN 400 MG PO TABS: 400.0000 mg | ORAL_TABLET | Freq: Four times a day (QID) | ORAL | Status: DC | PRN

## 2017-12-14 MED ORDER — APIXABAN 5 MG PO TABS
5.0000 mg | ORAL_TABLET | Freq: Two times a day (BID) | ORAL | Status: DC
  Administered 2017-12-14 – 2017-12-15 (×2): 5 mg via ORAL
  Filled 2017-12-14 (×2): qty 1

## 2017-12-14 MED ORDER — ACETAMINOPHEN 325 MG PO TABS: 650.0000 mg | ORAL_TABLET | Freq: Four times a day (QID) | ORAL | Status: DC | PRN

## 2017-12-14 MED ORDER — METHYLPREDNISOLONE SODIUM SUCC 1000 MG IJ SOLR
200.0000 mg | Freq: Once | INTRAMUSCULAR | Status: AC
  Administered 2017-12-14: 200 mg via INTRAVENOUS
  Filled 2017-12-14: qty 1.6

## 2017-12-14 MED ORDER — ENOXAPARIN SODIUM 40 MG/0.4ML ~~LOC~~ SOLN: 40.0000 mg | SUBCUTANEOUS | Status: DC

## 2017-12-14 MED ORDER — SODIUM CHLORIDE 0.9 % IV SOLN: 250.0000 mL | INTRAVENOUS | Status: DC | PRN

## 2017-12-14 MED ORDER — DILTIAZEM HCL ER COATED BEADS 120 MG PO CP24
120.0000 mg | ORAL_CAPSULE | Freq: Two times a day (BID) | ORAL | Status: DC
  Administered 2017-12-14 – 2017-12-15 (×2): 120 mg via ORAL
  Filled 2017-12-14 (×2): qty 1

## 2017-12-14 MED ORDER — ACETAMINOPHEN 325 MG PO TABS: 325.0000 mg | ORAL_TABLET | Freq: Every day | ORAL | Status: DC | PRN

## 2017-12-14 MED ORDER — ONDANSETRON HCL 4 MG/2ML IJ SOLN: 4.0000 mg | Freq: Four times a day (QID) | INTRAMUSCULAR | Status: DC | PRN

## 2017-12-14 MED ORDER — PANTOPRAZOLE SODIUM 20 MG PO TBEC
20.0000 mg | DELAYED_RELEASE_TABLET | Freq: Every day | ORAL | Status: DC
  Administered 2017-12-14 – 2017-12-15 (×2): 20 mg via ORAL
  Filled 2017-12-14 (×2): qty 1

## 2017-12-14 MED ORDER — SODIUM CHLORIDE 0.9% FLUSH: 3.0000 mL | Freq: Two times a day (BID) | INTRAVENOUS | Status: DC

## 2017-12-14 MED ORDER — ONDANSETRON HCL 4 MG PO TABS: 4.0000 mg | ORAL_TABLET | Freq: Four times a day (QID) | ORAL | Status: DC | PRN

## 2017-12-14 MED ORDER — ACETAMINOPHEN 650 MG RE SUPP: 650.0000 mg | Freq: Four times a day (QID) | RECTAL | Status: DC | PRN

## 2017-12-14 MED ORDER — SODIUM CHLORIDE 0.9% FLUSH: 3.0000 mL | INTRAVENOUS | Status: DC | PRN

## 2017-12-14 MED ORDER — OCUVITE-LUTEIN PO CAPS
1.0000 | ORAL_CAPSULE | Freq: Every day | ORAL | Status: DC
  Administered 2017-12-14: 1 via ORAL
  Filled 2017-12-14 (×3): qty 1

## 2017-12-14 MED ORDER — MORPHINE SULFATE (PF) 4 MG/ML IV SOLN
2.0000 mg | Freq: Once | INTRAVENOUS | Status: AC
  Administered 2017-12-14: 2 mg via INTRAVENOUS
  Filled 2017-12-14: qty 1

## 2017-12-14 MED ORDER — IOHEXOL 300 MG/ML  SOLN
100.0000 mL | Freq: Once | INTRAMUSCULAR | Status: AC | PRN
  Administered 2017-12-14: 100 mL via INTRAVENOUS

## 2017-12-14 MED ORDER — SENNOSIDES-DOCUSATE SODIUM 8.6-50 MG PO TABS
2.0000 | ORAL_TABLET | Freq: Two times a day (BID) | ORAL | Status: DC
Start: 2017-12-14 — End: 2017-12-15
  Administered 2017-12-14 – 2017-12-15 (×2): 2 via ORAL
  Filled 2017-12-14 (×2): qty 2

## 2017-12-14 MED ORDER — ICAPS AREDS 2 PO CAPS: 2.0000 | ORAL_CAPSULE | Freq: Every day | ORAL | Status: DC

## 2017-12-14 MED ORDER — POLYETHYL GLYCOL-PROPYL GLYCOL 0.4-0.3 % OP GEL
Freq: Every day | OPHTHALMIC | Status: DC | PRN
  Filled 2017-12-14: qty 10

## 2017-12-14 MED ORDER — POLYETHYLENE GLYCOL 3350 17 G PO PACK
17.0000 g | PACK | Freq: Every day | ORAL | Status: DC
Start: 2017-12-14 — End: 2017-12-15
  Administered 2017-12-14 – 2017-12-15 (×2): 17 g via ORAL
  Filled 2017-12-14 (×2): qty 1

## 2017-12-14 MED ORDER — SODIUM CHLORIDE 0.9 % IV SOLN
INTRAVENOUS | Status: DC
  Administered 2017-12-14: 19:00:00 via INTRAVENOUS

## 2017-12-14 MED ORDER — METOCLOPRAMIDE HCL 5 MG/ML IJ SOLN
10.0000 mg | Freq: Four times a day (QID) | INTRAMUSCULAR | Status: DC
  Administered 2017-12-14 – 2017-12-15 (×3): 10 mg via INTRAVENOUS
  Filled 2017-12-14 (×3): qty 2

## 2017-12-14 MED ORDER — SIMETHICONE 40 MG/0.6ML PO SUSP
40.0000 mg | Freq: Four times a day (QID) | ORAL | Status: DC | PRN
  Filled 2017-12-14: qty 0.6

## 2017-12-14 NOTE — ED Notes (Signed)
Attempted to give report to floor nurse ; floor nurse not available at this time and will call me back in 5 min

## 2017-12-14 NOTE — ED Triage Notes (Signed)
Pt has had ongoing upper abd pain for the past few weeks, reports having gallbladder out  At the beginning of July, reports pain that she had prior to surgery is continued and that tramadol is not helping, pt states she was told there was a blockage after her surgery but that they did a procedure and thought it took care of the blockage, states she cannot sleep due to the pain. Denies chest pain or sob

## 2017-12-14 NOTE — H&P (Signed)
History and Physical    Shelly Obrien XBW:[id number] DOB: June 03, 1923 DOA: 12/14/2017  PCP: [name], MD   Patient coming from: Home I have personally briefly reviewed patient's old medical records in [place]  Chief Complaint: Abdominal pain  HPI: Shelly Obrien is a 84 y.o. female with medical history significant of coronary artery disease, chronic kidney disease, abdominal pain status post cholecystectomy last week who presents with persistent abdominal pain.  Patient states that her pain has never gotten better.  She continues to complain of this discomfort which is mostly epigastric.  She is had a lot of abdominal gas both belching and flatus.  She also complains of some nausea and vomiting which has been associated with this pain.  Her to discharge from the hospital the patient was having difficulty with constipation and had to receive an enema which did relieve some of her discomfort but shortly thereafter she continued to have pain.  Dates that her appetite has been poor and she is been unable to eat much due to her discomfort but did ask to make sure that I gave her a diet .  ED Course: CT scan and abdominal series obtained and personally reviewed by me shows moderate to significant stool burden.  No other significant findings.  She was referred to me by Dr. Jeanell Sparrow from the emergency department.  Her to CT being done patient had to receive a protocol for her IV dye allergy.  She received Solu-Medrol diphenhydramine Zofran she is also received 4 mg of morphine in the emergency department as well.  Review of Systems: As per HPI otherwise all other systems reviewed and  negative.    Past Medical History:  Diagnosis Date  . CAD S/P percutaneous coronary angioplasty    hx MI 04/ 1994  s/p  PCI to LAD  . CKD (chronic kidney disease), stage III ([place])   . Complication of anesthesia    hard to wake   . History of basal cell carcinoma (BCC) excision    right eye area 08/ 2013  .  History of kidney stones 01/20/2017  . History of MI (myocardial infarction) 09/1992   s/p  PCI to LAD  . History of squamous cell carcinoma excision    right lower leg 2015 ;  2016  . Hyperlipidemia   . Hypertension   . Macular degeneration   . PAF (paroxysmal atrial fibrillation) ([place]) dx 02-24-2016   followed by cardiologist-- dr Mamie Nick. [name]  . PONV (postoperative nausea and vomiting)   . Right ureteral stone   . Wears glasses     Past Surgical History:  Procedure Laterality Date  . ABDOMINAL HYSTERECTOMY    . BREAST EXCISIONAL BIOPSY Left 11/02/2009   fibroadenoma (lumpectomy)  . BROW LIFT Right 04/07/2014   Procedure: REPAIR OF ENTROPION AND TRICHIASIS OF RIGHT LOWER EYE LID ;  Surgeon: Theodoro Kos, DO;  Location: [place];  Service: Plastics;  Laterality: Right;  . CARDIOVERSION N/A 07/18/2017   Procedure: CARDIOVERSION;  Surgeon: Skeet Latch, MD;  Location: [place];  Service: Cardiovascular;  Laterality: N/A;  . CATARACT EXTRACTION W/ INTRAOCULAR LENS  IMPLANT, BILATERAL    . CHOLECYSTECTOMY N/A 12/06/2017   Procedure: LAPAROSCOPIC CHOLECYSTECTOMY;  Surgeon: Jovita Kussmaul, MD;  [facility];  Service: General;  Laterality: N/A;  . CORONARY ANGIOPLASTY  04/ 1994  dr Lia Foyer   PCI to LAD  . CYSTOSCOPY W/ URETERAL STENT PLACEMENT Right 01/20/2017   Procedure: CYSTOSCOPY WITH RIGHT  RETROGRADE PYELOGRAM/RIGHT URETERAL STENT PLACEMENT;  Surgeon: Alexis Frock, MD;  [facility];  Service: Urology;  Laterality: Right;  . CYSTOSCOPY WITH RETROGRADE PYELOGRAM, URETEROSCOPY AND STENT PLACEMENT Right 02/15/2017   Procedure: CYSTOSCOPY WITH RETROGRADE PYELOGRAM, URETEROSCOPY, STONE BASKETRY AND STENT REPLACEMENT;  Surgeon: Alexis Frock, MD;  Location[facility];  Service: Urology;  Laterality: Right;  . CYSTOSCOPY/RETROGRADE/URETEROSCOPY/STONE EXTRACTION WITH BASKET  yrs ago  . DILATION AND CURETTAGE OF UTERUS    . HOLMIUM LASER  APPLICATION Right 06/09/2692   Procedure: HOLMIUM LASER APPLICATION;  Surgeon: Alexis Frock, MD;  Location: [facility];  Service: Urology;  Laterality: Right;  . KNEE SURGERY     right  . NEUROPLASTY / TRANSPOSITION ULNAR NERVE AT ELBOW Left 02-23-2000    [facility]   and Left Carpal Tunnel Release  . TRANSTHORACIC ECHOCARDIOGRAM  02/26/2016   moderate focal basal and mild concentric LVH,  ef 55-60%,  akinesis of the basal-midanteroseptal, inferoseptal and apicalinferior myocardium, study not sufficient to evaluation diastolic funciton due to atrial fib./  trivial PR/  mild TR    Social History   Social History Narrative   Lives at home     reports that she has never smoked. She has never used smokeless tobacco. She reports that she does not drink alcohol or use drugs.  Allergies  Allergen Reactions  . Antihistamines, Chlorpheniramine-Type Other (See Comments)    Pt states that she passed out.   [name] Penicillins Anaphylaxis and Other (See Comments)    Has patient had a PCN reaction causing immediate rash, facial/tongue/throat swelling, SOB or lightheadedness with hypotension: Yes Has patient had a PCN reaction causing severe rash involving mucus membranes or skin necrosis: No Has patient had a PCN reaction that required hospitalization No Has patient had a PCN reaction occurring within the last 10 years: No If all of the above answers are "NO", then may proceed with Cephalosporin use.  . Contrast Media [Iodinated Diagnostic Agents] Hives  . Sulfonamide Derivatives Other (See Comments)    Reaction:  Unknown   . Atorvastatin Other (See Comments)    Reaction:  Unknown   . Pheniramine Other (See Comments)    Pt states that she passed out.     Family History  Problem Relation Age of Onset  . Coronary artery disease Unknown        positive family hx of  . Cancer Unknown        family hx of  . Brain cancer Son   . Liver cancer Daughter      Prior to Admission  medications   Medication Sig Start Date End Date Taking? Authorizing Provider  acetaminophen (TYLENOL) 325 MG tablet Take 325 mg by mouth daily as needed for mild pain.    Yes [provider]  diltiazem (CARDIZEM CD) 120 MG 24 hr capsule TAKE ONE CAPSULE BY MOUTH TWICE DAILY 10/02/17  Yes Sherran Needs, NP  ELIQUIS 5 MG TABS tablet TAKE 1 TABLET BY MOUTH TWICE DAILY 11/27/17  Yes Sherran Needs, NP  Multiple Vitamins-Minerals (ICAPS AREDS 2) CAPS Take 2 capsules by mouth daily.    Yes [provider]  pantoprazole (PROTONIX) 20 MG tablet Take 1 tablet (20 mg total) by mouth daily. 11/30/17  Yes Dorie Rank, MD  Polyethyl Glycol-Propyl Glycol (SYSTANE OP) Place 1 drop into both eyes daily as needed (itchy eyes).   Yes [provider]  polyethylene glycol (MIRALAX / GLYCOLAX) packet Take 17 g by mouth daily.  12/11/17  Yes Hosie Poisson, MD  senna-docusate (SENOKOT-S) 8.6-50 MG tablet Take 2 tablets by mouth 2 (two) times daily as needed for mild constipation. 12/10/17  Yes Hosie Poisson, MD  simethicone (MYLICON) 40 FU/9.3AT drops Take 0.6 mLs (40 mg total) by mouth 4 (four) times daily as needed for flatulence. 12/10/17  Yes Hosie Poisson, MD  traMADol (ULTRAM) 50 MG tablet Take 2 tablets (100 mg total) by mouth every 8 (eight) hours as needed for moderate pain. 12/11/17  Yes Eugenie Filler, MD  triamcinolone (NASACORT ALLERGY 24HR) 55 MCG/ACT AERO nasal inhaler Place 1-2 sprays into the nose 2 (two) times daily as needed (for rhinitis).    Yes [provider]    Physical Exam:  Constitutional: NAD, calm, comfortable Vitals:   12/14/17 1200 12/14/17 1300 12/14/17 1400 12/14/17 1500  BP: (!) 155/97 122/83 136/88 (!) 144/89  Pulse: 81 70 83 83  Resp: 12 12 10 15   Temp:      TempSrc:      SpO2: 92% 90% 90% 92%   Eyes: PERRL, lids and conjunctivae normal ENMT: Mucous membranes are moist. Posterior pharynx clear of any exudate or lesions.Normal dentition.    Neck: normal, supple, no masses, no thyromegaly Respiratory: clear to auscultation bilaterally, no wheezing, no crackles. Normal respiratory effort. No accessory muscle use.  Cardiovascular: Regular rate and rhythm, no murmurs / rubs / gallops. No extremity edema. 2+ pedal pulses. No carotid bruits.  Abdomen: Mild to moderate diffuse tenderness most significant in the epigastric area tenderness, no masses palpated. No hepatosplenomegaly. Bowel sounds positive.  Musculoskeletal: no clubbing / cyanosis. No joint deformity upper and lower extremities. Good ROM, no contractures. Normal muscle tone.  Skin: no rashes, lesions, ulcers. No induration Neurologic: CN 2-12 grossly intact. Sensation intact, DTR normal. Strength 5/5 in all 4.  Psychiatric: Normal judgment and insight. Alert and oriented x 3. Normal mood.    Labs on Admission: I have personally reviewed following labs and imaging studies  CBC: Recent Labs  Lab 12/08/17 0326 12/09/17 0907 12/14/17 0512  WBC 8.8  --  6.8  HGB 10.8* 12.1 12.5  HCT 35.1* 38.8 41.3  MCV 89.1  --  88.6  PLT 211  --  557   Basic Metabolic Panel: Recent Labs  Lab 12/08/17 0326 12/14/17 0512  NA 144 139  K 3.8 4.0  CL 110 104  CO2 28 25  GLUCOSE 120* 126*  BUN 18 19  CREATININE 0.84 1.00  CALCIUM 9.2 9.6   GFR: Estimated Creatinine Clearance: 35.8 mL/min (by C-G formula based on SCr of 1 mg/dL). Liver Function Tests: Recent Labs  Lab 12/14/17 0512  AST 19  ALT 15  ALKPHOS 123  BILITOT 0.8  PROT 6.5  ALBUMIN 3.4*   Recent Labs  Lab 12/14/17 0512  LIPASE 33    Urine analysis:    Component Value Date/Time   COLORURINE YELLOW 12/14/2017 [place] 12/14/2017 0757   LABSPEC 1.010 12/14/2017 0757   PHURINE 7.5 12/14/2017 0757   GLUCOSEU NEGATIVE 12/14/2017 0757   HGBUR NEGATIVE 12/14/2017 0757   BILIRUBINUR NEGATIVE 12/14/2017 0757   KETONESUR NEGATIVE 12/14/2017 0757   PROTEINUR NEGATIVE 12/14/2017 0757    UROBILINOGEN 2.0 (H) 01/30/2016 1307   NITRITE NEGATIVE 12/14/2017 0757   LEUKOCYTESUR TRACE (A) 12/14/2017 0757    Radiological Exams on Admission: Dg Chest 1 View  Result Date: 12/14/2017 CLINICAL DATA:  Abdominal pain.  Recent cholecystectomy. EXAM: CHEST  1 VIEW COMPARISON:  Chest x-ray dated November 30, 2017. FINDINGS: Stable borderline cardiomegaly. Normal pulmonary vascularity. No focal consolidation, pleural effusion, or pneumothorax. No acute osseous abnormality. IMPRESSION: No active disease. Electronically Signed   By: Titus Dubin M.D.   On: 12/14/2017 08:07   Ct Abdomen Pelvis W Contrast  Result Date: 12/14/2017 CLINICAL DATA:  Fever and upper abdominal pain. Recent laparoscopic cholecystectomy on 12/06/2017. EXAM: CT ABDOMEN AND PELVIS WITH CONTRAST TECHNIQUE: Multidetector CT imaging of the abdomen and pelvis was performed using the standard protocol following bolus administration of intravenous contrast. CONTRAST:  166mL OMNIPAQUE IOHEXOL 300 MG/ML  SOLN COMPARISON:  CT abdomen pelvis dated November 30, 2017. FINDINGS: Lower chest: Right lower lobe atelectasis. Hepatobiliary: No focal liver abnormality. Interval cholecystectomy. Postsurgical stranding in the gallbladder fossa without discrete fluid collection. Mild prominence of the main common bile duct is unchanged. No intrahepatic biliary dilatation. Pancreas: Possible cystic lesion in the inferior pancreatic head measuring 1.7 x 1.2 cm, unchanged since 2012, likely benign. No pancreatic ductal dilatation or surrounding inflammatory changes. Spleen: Normal in size without focal abnormality. Adrenals/Urinary Tract: The adrenal glands are unremarkable. Unchanged punctate bilateral renal calculi. No ureteral calculi. No hydronephrosis. The bladder is unremarkable. Stomach/Bowel: Small hiatal hernia, unchanged. The stomach is otherwise within normal limits. Appendix is normal. No bowel wall thickening, distention, or surrounding inflammatory  changes. Moderate stool burden throughout the colon. Vascular/Lymphatic: Aortic atherosclerosis. No enlarged abdominal or pelvic lymph nodes. Reproductive: Status post hysterectomy. No adnexal masses. Other: No free fluid or pneumoperitoneum. Musculoskeletal: No acute or significant osseous findings. Stable severe degenerative changes of the lumbar spine. IMPRESSION: 1.  No acute intra-abdominal process. 2. Interval cholecystectomy with small amount of postsurgical stranding in the gallbladder fossa. No discrete fluid collection. 3. Stable mild prominence of the common bile duct without obstructing lesion. Correlate with LFTs. 4. Unchanged bilateral punctate nonobstructive nephrolithiasis. 5.  Prominent stool throughout the colon favors constipation. 6.  Aortic atherosclerosis (ICD10-I70.0). Electronically Signed   By: Titus Dubin M.D.   On: 12/14/2017 14:08    EKG: Independently reviewed.  Sinus rhythm with VPCs incomplete left and right bundle branch blockages but is unchanged from prior  Assessment/Plan Principal Problem:   Constipation Active Problems:   Abdominal pain   1.  Constipation: Suspect that this may be the etiology of patient's discomfort given her belching and abdominal gas.  I will start her on Reglan 10 mg IV every 6 hours for 8 doses.  She should have a bowel movement in the next 12 to 24 hours.  I suspect that that will significant relief to relieve her pain.  I also switched her Senokot to twice daily scheduled which I think she should take at discharge.  At her age bowel atony can be a significant problem.  2.  Abdominal pain: Likely related to significant stool burden.  Doubt at this point it has anything to do with her cholecystectomy.  I am going to discontinue any narcotic medications and provide her with nonnarcotic pain control.  Patient symptoms to be relieved when she has a bowel movement if not then may require GI consult.   DVT prophylaxis: Lovenox Code Status:  Full code may need to be addressed at future date Family Communication: Talk with patient's son who was present at the bedside Disposition Plan: *Likely home in a.m. Consults called: None Admission status: Observation   Lady Deutscher MD FACP [facility] Pager [phone]  If 7PM-7AM, please contact night-coverage [email or web address] Password [facility]  12/14/2017, 3:39  PM    

## 2017-12-14 NOTE — ED Provider Notes (Signed)
[place] EMERGENCY DEPARTMENT Provider Note   CSN: [id number] Arrival date & time: 12/14/17  0451     History   Chief Complaint Chief Complaint  Patient presents with  . Abdominal Pain    HPI GENEVER HENTGES is a 84 y.o. female.  HPI  84 year old female presents today complaining of upper abdominal pain in the epigastric region radiating to the left flank and intermittent back.  Patient had a cholecystectomy 12/06/2017 was discharged 2 days ago.  She has epigastric pain worse with po intake.   Patient had ultrasound obtained on July 3 which showed multiple gallstones with no evidence of acute cholecystitis.  She states she has had no change in her pain since surgery and has continued.  Continues worsening with food nausea and vomiting. Reports a history of kidney stones.  Pain she describes is not consistent with this. Pain is worse with inspiration but she is not dyspneic. Past Medical History:  Diagnosis Date  . CAD S/P percutaneous coronary angioplasty    hx MI 04/ 1994  s/p  PCI to LAD  . CKD (chronic kidney disease), stage III ([place])   . Complication of anesthesia    hard to wake   . History of basal cell carcinoma (BCC) excision    right eye area 08/ 2013  . History of kidney stones 01/20/2017  . History of MI (myocardial infarction) 09/1992   s/p  PCI to LAD  . History of squamous cell carcinoma excision    right lower leg 2015 ;  2016  . Hyperlipidemia   . Hypertension   . Macular degeneration   . PAF (paroxysmal atrial fibrillation) ([place]) dx 02-24-2016   followed by cardiologist-- dr Mamie Nick. [name]  . PONV (postoperative nausea and vomiting)   . Right ureteral stone   . Wears glasses     Patient Active Problem List   Diagnosis Date Noted  . Constipation   . Acute on chronic cholecystitis s/p lap cholecystectomy 12/06/2017 12/04/2017  . Nephrolithiasis 12/04/2017  . Abdominal pain 12/04/2017  . Spinal stenosis in cervical region 04/24/2017  .  Spinal stenosis of lumbar region 04/24/2017  . Dizziness 04/24/2017  . Ureteral stone with hydronephrosis 01/20/2017  . Atrial fibrillation with RVR ([place]) 02/24/2016  . CKD (chronic kidney disease) stage 3, GFR 30-59 ml/min ([facility]) 02/24/2016  . Paroxysmal atrial fibrillation ([place]) 02/24/2016  . 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 CHOLELITHIASIS 02/14/2009  . HYPERLIPIDEMIA 12/29/2008  . Essential hypertension 12/29/2008  . MYOCARDIAL INFARCTION, HX OF 12/29/2008  . CAD (coronary artery disease) 12/29/2008    Past Surgical History:  Procedure Laterality Date  . ABDOMINAL HYSTERECTOMY    . BREAST EXCISIONAL BIOPSY Left 11/02/2009   fibroadenoma (lumpectomy)  . BROW LIFT Right 04/07/2014   Procedure: REPAIR OF ENTROPION AND TRICHIASIS OF RIGHT LOWER EYE LID ;  Surgeon: Theodoro Kos, DO;  Location: [place];  Service: Plastics;  Laterality: Right;  . CARDIOVERSION N/A 07/18/2017   Procedure: CARDIOVERSION;  Surgeon: Skeet Latch, MD;  Location: [employer];  Service: Cardiovascular;  Laterality: N/A;  . CATARACT EXTRACTION W/ INTRAOCULAR LENS  IMPLANT, BILATERAL    . CHOLECYSTECTOMY N/A 12/06/2017   Procedure: LAPAROSCOPIC CHOLECYSTECTOMY;  Surgeon: Jovita Kussmaul, MD;  [facility];  Service: General;  Laterality: N/A;  . CORONARY ANGIOPLASTY  04/ 1994  dr Lia Foyer   PCI to LAD  . CYSTOSCOPY W/ URETERAL STENT PLACEMENT Right 01/20/2017   Procedure: CYSTOSCOPY WITH RIGHT RETROGRADE PYELOGRAM/RIGHT  URETERAL STENT PLACEMENT;  Surgeon: Alexis Frock, MD;  [facility];  Service: Urology;  Laterality: Right;  . CYSTOSCOPY WITH RETROGRADE PYELOGRAM, URETEROSCOPY AND STENT PLACEMENT Right 02/15/2017   Procedure: CYSTOSCOPY WITH RETROGRADE PYELOGRAM, URETEROSCOPY, STONE BASKETRY AND STENT REPLACEMENT;  Surgeon: Alexis Frock, MD;  Location: [facility];  Service: Urology;  Laterality: Right;  . CYSTOSCOPY/RETROGRADE/URETEROSCOPY/STONE  EXTRACTION WITH BASKET  yrs ago  . DILATION AND CURETTAGE OF UTERUS    . HOLMIUM LASER APPLICATION Right 3/47/4259   Procedure: HOLMIUM LASER APPLICATION;  Surgeon: Alexis Frock, MD;  Location: [facility];  Service: Urology;  Laterality: Right;  . KNEE SURGERY     right  . NEUROPLASTY / TRANSPOSITION ULNAR NERVE AT ELBOW Left 02-23-2000    [facility]   and Left Carpal Tunnel Release  . TRANSTHORACIC ECHOCARDIOGRAM  02/26/2016   moderate focal basal and mild concentric LVH,  ef 55-60%,  akinesis of the basal-midanteroseptal, inferoseptal and apicalinferior myocardium, study not sufficient to evaluation diastolic funciton due to atrial fib./  trivial PR/  mild TR     OB History   None      Home Medications    Prior to Admission medications   Medication Sig Start Date End Date Taking? Authorizing Provider  acetaminophen (TYLENOL) 325 MG tablet Take 325 mg by mouth daily as needed for mild pain.    Yes [provider]  diltiazem (CARDIZEM CD) 120 MG 24 hr capsule TAKE ONE CAPSULE BY MOUTH TWICE DAILY 10/02/17  Yes Sherran Needs, NP  ELIQUIS 5 MG TABS tablet TAKE 1 TABLET BY MOUTH TWICE DAILY 11/27/17  Yes Sherran Needs, NP  Multiple Vitamins-Minerals (ICAPS AREDS 2) CAPS Take 2 capsules by mouth daily.    Yes [provider]  pantoprazole (PROTONIX) 20 MG tablet Take 1 tablet (20 mg total) by mouth daily. 11/30/17  Yes Dorie Rank, MD  Polyethyl Glycol-Propyl Glycol (SYSTANE OP) Place 1 drop into both eyes daily as needed (itchy eyes).   Yes [provider]  polyethylene glycol (MIRALAX / GLYCOLAX) packet Take 17 g by mouth daily. 12/11/17  Yes Hosie Poisson, MD  senna-docusate (SENOKOT-S) 8.6-50 MG tablet Take 2 tablets by mouth 2 (two) times daily as needed for mild constipation. 12/10/17  Yes Hosie Poisson, MD  simethicone (MYLICON) 40 DG/3.8VF drops Take 0.6 mLs (40 mg total) by mouth 4 (four) times daily as needed for flatulence. 12/10/17  Yes Hosie Poisson, MD  traMADol (ULTRAM) 50 MG tablet Take 2 tablets (100 mg total) by mouth every 8 (eight) hours as needed for moderate pain. 12/11/17  Yes Eugenie Filler, MD  triamcinolone (NASACORT ALLERGY 24HR) 55 MCG/ACT AERO nasal inhaler Place 1-2 sprays into the nose 2 (two) times daily as needed (for rhinitis).    Yes [provider]    Family History Family History  Problem Relation Age of Onset  . Coronary artery disease Unknown        positive family hx of  . Cancer Unknown        family hx of  . Brain cancer Son   . Liver cancer Daughter     Social History Social History   Tobacco Use  . Smoking status: Never Smoker  . Smokeless tobacco: Never Used  Substance Use Topics  . Alcohol use: No    Alcohol/week: 0.0 oz  . Drug use: No     Allergies   Antihistamines, chlorpheniramine-type; Penicillins; Contrast media [iodinated diagnostic agents]; Sulfonamide derivatives;  Atorvastatin; and Pheniramine   Review of Systems Review of Systems  All other systems reviewed and are negative.    Physical Exam Updated Vital Signs BP (!) 149/92   Pulse 72   Temp 97.7 F (36.5 C) (Oral)   Resp 18   SpO2 96%   Physical Exam  Constitutional: She appears well-developed and well-nourished. She does not appear ill.  HENT:  Head: Normocephalic and atraumatic.  Mouth/Throat: Oropharynx is clear and moist.  Eyes: Pupils are equal, round, and reactive to light. EOM are normal.  Ectropion right lower eyelid  Cardiovascular: Normal rate and regular rhythm.  Pulmonary/Chest: Effort normal.  Abdominal: Normal appearance, normal aorta and bowel sounds are normal. There is tenderness in the epigastric area.  Skin: Skin is warm and dry. Capillary refill takes less than 2 seconds.  Nursing note and vitals reviewed.    ED Treatments / Results  Labs (all labs ordered are listed, but only abnormal results are displayed) Labs Reviewed  COMPREHENSIVE METABOLIC PANEL -  Abnormal; Notable for the following components:      Result Value   Glucose, Bld 126 (*)    Albumin 3.4 (*)    GFR calc non Af Amer 47 (*)    GFR calc Af Amer 55 (*)    All other components within normal limits  CBC - Abnormal; Notable for the following components:   RDW 17.3 (*)    All other components within normal limits  LIPASE, BLOOD  URINALYSIS, ROUTINE W REFLEX MICROSCOPIC    EKG EKG Interpretation  Date/Time:  Saturday December 14 2017 07:50:50 EDT Ventricular Rate:  75 PR Interval:    QRS Duration: 114 QT Interval:  418 QTC Calculation: 467 R Axis:   -73 Text Interpretation:  Age not entered, assumed to be  84 years old for purpose of ECG interpretation Sinus rhythm Ventricular premature complex Incomplete RBBB and LAFB Abnormal R-wave progression, late transition Confirmed by Pattricia Boss [phone]) on 12/14/2017 7:57:23 AM   Radiology Dg Chest 1 View  Result Date: 12/14/2017 CLINICAL DATA:  Abdominal pain.  Recent cholecystectomy. EXAM: CHEST  1 VIEW COMPARISON:  Chest x-Tahja Liao dated November 30, 2017. FINDINGS: Stable borderline cardiomegaly. Normal pulmonary vascularity. No focal consolidation, pleural effusion, or pneumothorax. No acute osseous abnormality. IMPRESSION: No active disease. Electronically Signed   By: Titus Dubin M.D.   On: 12/14/2017 08:07    Procedures Procedures (including critical care time)  Medications Ordered in ED Medications - No data to display   Initial Impression / Assessment and Plan / ED Course  I have reviewed the triage vital signs and the nursing notes.  Pertinent labs & imaging results that were available during my care of the patient were reviewed by me and considered in my medical decision making (see chart for details).    84 year old female status post cholecystectomy 12/04/17 presents today with abdominal pain.  She has a history of IV dye allergy.  CT is pending 4 hours post Solu-Medrol and Benadryl.  Surgery consulted and  discussed with Camillo Flaming and they will see in consult  General surgery states CT negative a.m. labs normal and does not need any surgical intervention.  Patient continues to have pain and is nauseated.  Plan consult to medicine.  Final Clinical Impressions(s) / ED Diagnoses   Final diagnoses:  Epigastric pain  Intractable vomiting with nausea, unspecified vomiting type    ED Discharge Orders    None       Pattricia Boss, MD  12/15/17 0707  

## 2017-12-14 NOTE — ED Notes (Signed)
ED Provider at bedside. 

## 2017-12-14 NOTE — ED Notes (Signed)
Pt given water to drink for PO fluid challenge  

## 2017-12-15 ENCOUNTER — Observation Stay (HOSPITAL_COMMUNITY): Payer: Medicare Other

## 2017-12-15 DIAGNOSIS — R109 Unspecified abdominal pain: Secondary | ICD-10-CM

## 2017-12-15 DIAGNOSIS — K5909 Other constipation: Secondary | ICD-10-CM

## 2017-12-15 DIAGNOSIS — K5900 Constipation, unspecified: Secondary | ICD-10-CM | POA: Diagnosis not present

## 2017-12-15 DIAGNOSIS — I480 Paroxysmal atrial fibrillation: Secondary | ICD-10-CM

## 2017-12-15 MED ORDER — PROSIGHT PO TABS
1.0000 | ORAL_TABLET | Freq: Every day | ORAL | Status: DC
  Administered 2017-12-15: 1 via ORAL
  Filled 2017-12-15: qty 1

## 2017-12-15 MED ORDER — SENNOSIDES-DOCUSATE SODIUM 8.6-50 MG PO TABS: 2.0000 | ORAL_TABLET | Freq: Every day | ORAL | 0 refills | Status: DC

## 2017-12-15 MED ORDER — BISACODYL 10 MG RE SUPP
10.0000 mg | Freq: Every day | RECTAL | Status: DC
  Administered 2017-12-15: 10 mg via RECTAL
  Filled 2017-12-15: qty 1

## 2017-12-15 NOTE — Discharge Summary (Addendum)
Discharge Summary  Shelly Obrien YQM:[id number] DOB: 05-02-1923  PCP: [name], MD  Admit date: 12/14/2017 Discharge date: 12/15/2017  Time spent: 62mins  Recommendations for Outpatient Follow-up:  1. F/u with PMD within a week  for hospital discharge follow up, repeat cbc/bmp at follow up. 2. F/u with general surgery on 7/23  Discharge Diagnoses:  Active Hospital Problems   Diagnosis Date Noted  . Constipation   . Abdominal pain 12/04/2017    Resolved Hospital Problems  No resolved problems to display.    Discharge Condition: stable  Diet recommendation: heart healthy  Filed Weights   12/14/17 1702 12/15/17 0501  Weight: 73.8 kg (162 lb 11.2 oz) 71.8 kg (158 lb 4.6 oz)    History of present illness: (per admitting MD Dr Evangeline Gula) PCP: [name], MD   Patient coming from: Home I have personally briefly reviewed patient's old medical records in [facility]  Chief Complaint: Abdominal pain  HPI: Shelly Obrien is a 84 y.o. female with medical history significant of coronary artery disease, chronic kidney disease, abdominal pain status post cholecystectomy last week who presents with persistent abdominal pain.  Patient states that her pain has never gotten better.  She continues to complain of this discomfort which is mostly epigastric.  She is had a lot of abdominal gas both belching and flatus.  She also complains of some nausea and vomiting which has been associated with this pain.  Her to discharge from the hospital the patient was having difficulty with constipation and had to receive an enema which did relieve some of her discomfort but shortly thereafter she continued to have pain.  Dates that her appetite has been poor and she is been unable to eat much due to her discomfort but did ask to make sure that I gave her a diet .  ED Course: CT scan and abdominal series obtained and personally reviewed by me shows moderate to significant stool burden.  No  other significant findings.  She was referred to me by Dr. Jeanell Sparrow from the emergency department.  Her to CT being done patient had to receive a protocol for her IV dye allergy.  She received Solu-Medrol diphenhydramine Zofran she is also received 4 mg of morphine in the emergency department as well.     Hospital Course:  Principal Problem:   Constipation Active Problems:   Abdominal pain   S/p laparoscopic cholecystectomy and umbilical hernia repair on 12/06/17 She returned back to the hospital due to abdominal pain ,repeat ct imaging no acute findings, labs unremarkable, no leukocytosis, lft wnl. Ab pain thought due to constipation, resolved after stool regimen. General surgery consulted, she is cleared to discharge home and outpatient follow up with general surgery on 7/23.  Acute on Chronic constipation Improved, she is discharged on daily senokot -s and miralax as needed    Paroxysmal atrial fib In sinus rhythm while in the hospital with good rate control.  continue cardizem and eliquis.  She follows cardiology Dr [name]  CAD with stent to LAD in the past.  Currently denies any chest pain or sob.   Hypertension  Well controlled.     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   Unchanged bilateral punctate nonobstructive nephrolithiasis on CT scan She follows urology Dr Tresa Moore  Baseline independent, still drives  Procedures:  none  Consultations:  General surgery   Code Status: partical code, no intubation, yes to the rest, verified with patient. She report  HCPOA is her cousin.  Family Communication: patient Disposition Plan: home   Discharge Exam: BP 112/78 (BP Location: Right Arm)   Pulse 76   Temp 97.9 F (36.6 C) (Oral)   Resp 16   Ht 5\' 6"  (1.676 m)   Wt 71.8 kg (158 lb 4.6 oz)   SpO2 96%   BMI 25.55 kg/m   General: NAD, appear younger than stated age, aaox3 Cardiovascular: RRR Respiratory: CTABL  Discharge  Instructions You were cared for by a hospitalist during your hospital stay. If you have any questions about your discharge medications or the care you received while you were in the hospital after you are discharged, you can call the unit and asked to speak with the hospitalist on call if the hospitalist that took care of you is not available. Once you are discharged, your primary care physician will handle any further medical issues. Please note that NO REFILLS for any discharge medications will be authorized once you are discharged, as it is imperative that you return to your primary care physician (or establish a relationship with a primary care physician if you do not have one) for your aftercare needs so that they can reassess your need for medications and monitor your lab values.  Discharge Instructions    Diet - low sodium heart healthy   Complete by:  As directed    Increase activity slowly   Complete by:  As directed      Allergies as of 12/15/2017      Reactions   Antihistamines, Chlorpheniramine-type Other (See Comments)   Pt states that she passed out.    Penicillins Anaphylaxis, Other (See Comments)   Has patient had a PCN reaction causing immediate rash, facial/tongue/throat swelling, SOB or lightheadedness with hypotension: Yes Has patient had a PCN reaction causing severe rash involving mucus membranes or skin necrosis: No Has patient had a PCN reaction that required hospitalization No Has patient had a PCN reaction occurring within the last 10 years: No If all of the above answers are "NO", then may proceed with Cephalosporin use.   Contrast Media [iodinated Diagnostic Agents] Hives   Sulfonamide Derivatives Other (See Comments)   Reaction:  Unknown    Atorvastatin Other (See Comments)   Reaction:  Unknown    Pheniramine Other (See Comments)   Pt states that she passed out.       Medication List    TAKE these medications   acetaminophen 325 MG tablet Commonly known  as:  TYLENOL Take 325 mg by mouth daily as needed for mild pain.   diltiazem 120 MG 24 hr capsule Commonly known as:  CARDIZEM CD TAKE ONE CAPSULE BY MOUTH TWICE DAILY   ELIQUIS 5 MG Tabs tablet Generic drug:  apixaban TAKE 1 TABLET BY MOUTH TWICE DAILY   ICAPS AREDS 2 Caps Take 2 capsules by mouth daily.   NASACORT ALLERGY 24HR 55 MCG/ACT Aero nasal inhaler Generic drug:  triamcinolone Place 1-2 sprays into the nose 2 (two) times daily as needed (for rhinitis).   pantoprazole 20 MG tablet Commonly known as:  PROTONIX Take 1 tablet (20 mg total) by mouth daily.   polyethylene glycol packet Commonly known as:  MIRALAX / GLYCOLAX Take 17 g by mouth daily.   senna-docusate 8.6-50 MG tablet Commonly known as:  Senokot-S Take 2 tablets by mouth at bedtime. What changed:    when to take this  reasons to take this   simethicone 40 MG/0.6ML drops Commonly known as:  MYLICON Take 0.6 mLs (40 mg total) by mouth 4 (four) times daily as needed for flatulence.   SYSTANE OP Place 1 drop into both eyes daily as needed (itchy eyes).   traMADol 50 MG tablet Commonly known as:  ULTRAM Take 2 tablets (100 mg total) by mouth every 8 (eight) hours as needed for moderate pain.      Allergies  Allergen Reactions  . Antihistamines, Chlorpheniramine-Type Other (See Comments)    Pt states that she passed out.   [name] Penicillins Anaphylaxis and Other (See Comments)    Has patient had a PCN reaction causing immediate rash, facial/tongue/throat swelling, SOB or lightheadedness with hypotension: Yes Has patient had a PCN reaction causing severe rash involving mucus membranes or skin necrosis: No Has patient had a PCN reaction that required hospitalization No Has patient had a PCN reaction occurring within the last 10 years: No If all of the above answers are "NO", then may proceed with Cephalosporin use.  . Contrast Media [Iodinated Diagnostic Agents] Hives  . Sulfonamide Derivatives Other  (See Comments)    Reaction:  Unknown   . Atorvastatin Other (See Comments)    Reaction:  Unknown   . Pheniramine Other (See Comments)    Pt states that she passed out.    Follow-up Information    [name], MD Follow up in 1 week(s).   Specialty:  Internal Medicine Why:  hospital discharge follow up Contact information: [address], [address] [place] [address] [phone]        Fay Records, MD .   Specialty:  Cardiology Contact information: [address] [place] [address] [phone]        [facility], [place] Follow up on 12/24/2017.   Specialty:  General Surgery Why:  12/24/17 at 11 AM Contact information: [place] [address] [phone]            The results of significant diagnostics from this hospitalization (including imaging, microbiology, ancillary and laboratory) are listed below for reference.    Significant Diagnostic Studies: Ct Abdomen Pelvis Wo Contrast  Result Date: 11/30/2017 CLINICAL DATA:  Chest and back pain over the past 3 days. EXAM: CT ABDOMEN AND PELVIS WITHOUT CONTRAST TECHNIQUE: Multidetector CT imaging of the abdomen and pelvis was performed following the standard protocol without IV contrast. COMPARISON:  01/20/2017 FINDINGS: Lower chest: Scarring in the right lower lobe and anteriorly in the left lower lobe. Coronary atherosclerosis. Aortic valve calcification. Mild cardiomegaly. There is some contrast medium in the esophagus suggesting reflux or dysmotility. Hepatobiliary: Numerous gallstones measuring up to about 1 cm in diameter. Pancreas: Possible cystic lesion inferiorly along the head of the pancreas, about 1.6 by 1.3 cm on image 43/3 which appears stable back through 12/07/2010. Spleen: Unremarkable Adrenals/Urinary Tract: Adrenal glands normal. There are about 3 nonobstructive right renal calculi each in the 1-2 mm diameter range. There are 2 punctate  calcifications in the left kidney lower pole on image 94/6 which are probably tiny nonobstructive renal calculi, and less likely to be vascular. Suspected left peripelvic cysts. No obstructive stone identified. Urinary bladder unremarkable. Stomach/Bowel: Unremarkable.  Normal appendix. Vascular/Lymphatic: Aortoiliac atherosclerotic vascular disease. No pathologic adenopathy. Reproductive: Uterus absent.  Adnexa unremarkable. Other: No supplemental non-categorized findings. Musculoskeletal: Thoracic and lumbar spondylosis and degenerative disc disease with suspected right foraminal impingement at T12-L1 and L1-2, and left foraminal impingement at L1-2, L2-3, and L3-4. Grade 1 degenerative anterolisthesis at L4-5. IMPRESSION:  1. Bilateral nonobstructive renal calculi. 2. A cause for the patient's chest and back pain is not identified. 3. Lower thoracic and lumbar spondylosis and degenerative disc disease. 4. Cholelithiasis. 5. Small fluid density lesion along the head of the pancreas is not appreciably changed over the past 5 years, and likely benign. 6.  Aortic Atherosclerosis (ICD10-I70.0). Electronically Signed   By: Van Clines M.D.   On: 11/30/2017 16:24   Dg Chest 1 View  Result Date: 12/14/2017 CLINICAL DATA:  Abdominal pain.  Recent cholecystectomy. EXAM: CHEST  1 VIEW COMPARISON:  Chest x-ray dated November 30, 2017. FINDINGS: Stable borderline cardiomegaly. Normal pulmonary vascularity. No focal consolidation, pleural effusion, or pneumothorax. No acute osseous abnormality. IMPRESSION: No active disease. Electronically Signed   By: Titus Dubin M.D.   On: 12/14/2017 08:07   Dg Chest 2 View  Result Date: 11/30/2017 CLINICAL DATA:  Pain beneath both breasts radiating to the back bilaterally for the past 3 days. Some nausea. EXAM: CHEST - 2 VIEW COMPARISON:  06/26/2017. FINDINGS: The cardiac silhouette remains borderline enlarged and the aorta remains tortuous. The pulmonary vasculature and  interstitial markings remain mildly prominent. No significant change in linear scarring at both lung bases. Thoracic spine degenerative changes and diffuse osteopenia. IMPRESSION: 1. No acute abnormality. 2. Stable borderline cardiomegaly, mild pulmonary vascular congestion and mild chronic interstitial lung disease. Electronically Signed   By: Claudie Revering M.D.   On: 11/30/2017 09:13   Dg Abd 1 View  Result Date: 12/15/2017 CLINICAL DATA:  Constipation EXAM: ABDOMEN - 1 VIEW COMPARISON:  CT abdomen pelvis 12/14/2017 FINDINGS: Contrast in nondilated colon from prior CT. No bowel dilatation. Minimal stool in the colon Cholecystectomy clips in the right upper quadrant. Lumbar dextroscoliosis. IMPRESSION: Nonobstructive bowel gas pattern. Oral contrast in the colon from prior CT. Electronically Signed   By: Franchot Gallo M.D.   On: 12/15/2017 09:44   US Abdomen Complete  Result Date: 12/04/2017 CLINICAL DATA:  Right upper quadrant and flank pain EXAM: ABDOMEN ULTRASOUND COMPLETE COMPARISON:  CT 11/30/2017 FINDINGS: Gallbladder: Multiple shadowing stones. Normal wall thickness. No sonographic Percell Miller indicated. Common bile duct: Diameter: 7.3 mm Liver: No focal lesion identified. Within normal limits in parenchymal echogenicity. Portal vein is patent on color Doppler imaging with normal direction of blood flow towards the liver. IVC: No abnormality visualized. Pancreas: 1.9 x 1.2 cm cystic lesion at the head of the pancreas. Spleen: Size and appearance within normal limits. Right Kidney: Length: 11.3 cm. Cortical echogenicity within normal limits. No hydronephrosis. 1.2 cm cyst in the midpole. Left Kidney: Length: 10.3 cm. Echogenicity within normal limits. No mass or hydronephrosis visualized. Abdominal aorta: No aneurysm visualized. Other findings: None. IMPRESSION: 1. Multiple gallstones without definite evidence for acute cholecystitis. Slightly enlarged common bile duct up to 7.3 mm, recommend correlation  with LFTs. 2. 1.9 cm cyst at the head of the pancreas 3. Small cyst in the right kidney Electronically Signed   By: Donavan Foil M.D.   On: 12/04/2017 03:38   Ct Abdomen Pelvis W Contrast  Result Date: 12/14/2017 CLINICAL DATA:  Fever and upper abdominal pain. Recent laparoscopic cholecystectomy on 12/06/2017. EXAM: CT ABDOMEN AND PELVIS WITH CONTRAST TECHNIQUE: Multidetector CT imaging of the abdomen and pelvis was performed using the standard protocol following bolus administration of intravenous contrast. CONTRAST:  162mL OMNIPAQUE IOHEXOL 300 MG/ML  SOLN COMPARISON:  CT abdomen pelvis dated November 30, 2017. FINDINGS: Lower chest: Right lower lobe atelectasis. Hepatobiliary: No focal liver abnormality. Interval cholecystectomy.  Postsurgical stranding in the gallbladder fossa without discrete fluid collection. Mild prominence of the main common bile duct is unchanged. No intrahepatic biliary dilatation. Pancreas: Possible cystic lesion in the inferior pancreatic head measuring 1.7 x 1.2 cm, unchanged since 2012, likely benign. No pancreatic ductal dilatation or surrounding inflammatory changes. Spleen: Normal in size without focal abnormality. Adrenals/Urinary Tract: The adrenal glands are unremarkable. Unchanged punctate bilateral renal calculi. No ureteral calculi. No hydronephrosis. The bladder is unremarkable. Stomach/Bowel: Small hiatal hernia, unchanged. The stomach is otherwise within normal limits. Appendix is normal. No bowel wall thickening, distention, or surrounding inflammatory changes. Moderate stool burden throughout the colon. Vascular/Lymphatic: Aortic atherosclerosis. No enlarged abdominal or pelvic lymph nodes. Reproductive: Status post hysterectomy. No adnexal masses. Other: No free fluid or pneumoperitoneum. Musculoskeletal: No acute or significant osseous findings. Stable severe degenerative changes of the lumbar spine. IMPRESSION: 1.  No acute intra-abdominal process. 2. Interval  cholecystectomy with small amount of postsurgical stranding in the gallbladder fossa. No discrete fluid collection. 3. Stable mild prominence of the common bile duct without obstructing lesion. Correlate with LFTs. 4. Unchanged bilateral punctate nonobstructive nephrolithiasis. 5.  Prominent stool throughout the colon favors constipation. 6.  Aortic atherosclerosis (ICD10-I70.0). Electronically Signed   By: Titus Dubin M.D.   On: 12/14/2017 14:08   Dg Abd 2 Views  Result Date: 12/09/2017 CLINICAL DATA:  Hypertension.  Left upper quadrant abdominal pain. EXAM: ABDOMEN - 2 VIEW COMPARISON:  Ultrasound 12/04/2017.  CT 11/30/2017. FINDINGS: There is a large amount of fecal matter within the colon which could go along with constipation. Small renal calculi seen by CT are not visible. Benign soft tissue calcification in the pelvis is unchanged. Previous cholecystectomy clips are noted. Chronic curvature in degenerative change of the spine as seen previously. IMPRESSION: Large amount of fecal matter within the colon consistent with constipation. No other acute or significant finding. Electronically Signed   By: Nelson Chimes M.D.   On: 12/09/2017 09:55    Microbiology: Recent Results (from the past 240 hour(s))  Surgical pcr screen     Status: None   Collection Time: 12/06/17  4:55 AM  Result Value Ref Range Status   MRSA, PCR NEGATIVE NEGATIVE Final   Staphylococcus aureus NEGATIVE NEGATIVE Final    Comment: (NOTE) The Xpert SA Assay (FDA approved for NASAL specimens in patients 25 years of age and older), is one component of a comprehensive surveillance program. It is not intended to diagnose infection nor to guide or monitor treatment. Performed at [facility], [place] [address], [place], [place] [address]      Labs: Basic Metabolic Panel: Recent Labs  Lab 12/14/17 0512 12/14/17 1620  NA 139  --   K 4.0  --   CL 104  --   CO2 25  --   GLUCOSE 126*  --   BUN 19  --     CREATININE 1.00 0.93  CALCIUM 9.6  --    Liver Function Tests: Recent Labs  Lab 12/14/17 0512  AST 19  ALT 15  ALKPHOS 123  BILITOT 0.8  PROT 6.5  ALBUMIN 3.4*   Recent Labs  Lab 12/14/17 0512  LIPASE 33   No results for input(s): AMMONIA in the last 168 hours. CBC: Recent Labs  Lab 12/09/17 0907 12/14/17 0512 12/14/17 1620  WBC  --  6.8 5.0  HGB 12.1 12.5 13.3  HCT 38.8 41.3 43.5  MCV  --  88.6 87.9  PLT  --  234  237   Cardiac Enzymes: No results for input(s): CKTOTAL, CKMB, CKMBINDEX, TROPONINI in the last 168 hours. BNP: BNP (last 3 results) Recent Labs    06/26/17 1552  BNP 644.9*    ProBNP (last 3 results) No results for input(s): PROBNP in the last 8760 hours.  CBG: No results for input(s): GLUCAP in the last 168 hours.     Signed:  Florencia Reasons MD, PhD  [facility] 12/15/2017, 1:01 PM

## 2017-12-15 NOTE — Progress Notes (Signed)
Subjective/Chief Complaint: Patient had a BM this morning - abdomen feels better. Hungry - wants a soft diet   Objective: Vital signs in last 24 hours: Temp:  [97.8 F (36.6 C)-98 F (36.7 C)] 97.9 F (36.6 C) (07/14 0500) Pulse Rate:  [70-83] 76 (07/14 0500) Resp:  [10-18] 16 (07/14 0500) BP: (112-155)/(78-97) 112/78 (07/14 0500) SpO2:  [90 %-96 %] 96 % (07/14 0500) Weight:  [71.8 kg (158 lb 4.6 oz)-73.8 kg (162 lb 11.2 oz)] 71.8 kg (158 lb 4.6 oz) (07/14 0501) Last BM Date: 12/10/17  Intake/Output from previous day: 07/13 0701 - 07/14 0700 In: 408.7 [I.V.:362.5; IV Piggyback:46.2] Out: -  Intake/Output this shift: No intake/output data recorded.  GI: soft, non-tender; bowel sounds normal; no masses,  no organomegaly Incisions c/d/i  Lab Results:  Recent Labs    12/14/17 0512 12/14/17 1620  WBC 6.8 5.0  HGB 12.5 13.3  HCT 41.3 43.5  PLT 234 237   BMET Recent Labs    12/14/17 0512 12/14/17 1620  NA 139  --   K 4.0  --   CL 104  --   CO2 25  --   GLUCOSE 126*  --   BUN 19  --   CREATININE 1.00 0.93  CALCIUM 9.6  --    PT/INR No results for input(s): LABPROT, INR in the last 72 hours. ABG No results for input(s): PHART, HCO3 in the last 72 hours.  Invalid input(s): PCO2, PO2  Studies/Results: Dg Chest 1 View  Result Date: 12/14/2017 CLINICAL DATA:  Abdominal pain.  Recent cholecystectomy. EXAM: CHEST  1 VIEW COMPARISON:  Chest x-ray dated November 30, 2017. FINDINGS: Stable borderline cardiomegaly. Normal pulmonary vascularity. No focal consolidation, pleural effusion, or pneumothorax. No acute osseous abnormality. IMPRESSION: No active disease. Electronically Signed   By: Titus Dubin M.D.   On: 12/14/2017 08:07   Dg Abd 1 View  Result Date: 12/15/2017 CLINICAL DATA:  Constipation EXAM: ABDOMEN - 1 VIEW COMPARISON:  CT abdomen pelvis 12/14/2017 FINDINGS: Contrast in nondilated colon from prior CT. No bowel dilatation. Minimal stool in the colon  Cholecystectomy clips in the right upper quadrant. Lumbar dextroscoliosis. IMPRESSION: Nonobstructive bowel gas pattern. Oral contrast in the colon from prior CT. Electronically Signed   By: Franchot Gallo M.D.   On: 12/15/2017 09:44   Ct Abdomen Pelvis W Contrast  Result Date: 12/14/2017 CLINICAL DATA:  Fever and upper abdominal pain. Recent laparoscopic cholecystectomy on 12/06/2017. EXAM: CT ABDOMEN AND PELVIS WITH CONTRAST TECHNIQUE: Multidetector CT imaging of the abdomen and pelvis was performed using the standard protocol following bolus administration of intravenous contrast. CONTRAST:  13mL OMNIPAQUE IOHEXOL 300 MG/ML  SOLN COMPARISON:  CT abdomen pelvis dated November 30, 2017. FINDINGS: Lower chest: Right lower lobe atelectasis. Hepatobiliary: No focal liver abnormality. Interval cholecystectomy. Postsurgical stranding in the gallbladder fossa without discrete fluid collection. Mild prominence of the main common bile duct is unchanged. No intrahepatic biliary dilatation. Pancreas: Possible cystic lesion in the inferior pancreatic head measuring 1.7 x 1.2 cm, unchanged since 2012, likely benign. No pancreatic ductal dilatation or surrounding inflammatory changes. Spleen: Normal in size without focal abnormality. Adrenals/Urinary Tract: The adrenal glands are unremarkable. Unchanged punctate bilateral renal calculi. No ureteral calculi. No hydronephrosis. The bladder is unremarkable. Stomach/Bowel: Small hiatal hernia, unchanged. The stomach is otherwise within normal limits. Appendix is normal. No bowel wall thickening, distention, or surrounding inflammatory changes. Moderate stool burden throughout the colon. Vascular/Lymphatic: Aortic atherosclerosis. No enlarged abdominal or pelvic lymph nodes.  Reproductive: Status post hysterectomy. No adnexal masses. Other: No free fluid or pneumoperitoneum. Musculoskeletal: No acute or significant osseous findings. Stable severe degenerative changes of the lumbar  spine. IMPRESSION: 1.  No acute intra-abdominal process. 2. Interval cholecystectomy with small amount of postsurgical stranding in the gallbladder fossa. No discrete fluid collection. 3. Stable mild prominence of the common bile duct without obstructing lesion. Correlate with LFTs. 4. Unchanged bilateral punctate nonobstructive nephrolithiasis. 5.  Prominent stool throughout the colon favors constipation. 6.  Aortic atherosclerosis (ICD10-I70.0). Electronically Signed   By: Titus Dubin M.D.   On: 12/14/2017 14:08    Anti-infectives: Anti-infectives (From admission, onward)   None      Assessment/Plan: S/p laparoscopic cholecystectomy and umbilical hernia repair on 12/06/17 Chronic constipation  No acute surgical issues Bowel regimen Advance diet  Follow-up as previously scheduled on 12/24/17 at 11 AM Will sign off for now on this admission.    LOS: 0 days    Maia Petties 12/15/2017

## 2017-12-15 NOTE — Progress Notes (Addendum)
Pt in room, educated on discharge information including med changes and follow up appointments, and given discharge packet. IV removed, patient is dressed and awaiting ride.

## 2017-12-16 ENCOUNTER — Telehealth: Payer: Self-pay | Admitting: Internal Medicine

## 2017-12-16 NOTE — Telephone Encounter (Signed)
New message    Patient request to be discharged from Dr [name] to another cardiologist at [facility] Please advise who can follow patient. Please advise

## 2017-12-16 NOTE — Telephone Encounter (Signed)
Pt has right to pick another cardiologist I do not have any particular recommendations

## 2017-12-19 NOTE — Telephone Encounter (Signed)
Spoke with scheduler who will follow up with patient.

## 2017-12-24 DIAGNOSIS — Z9049 Acquired absence of other specified parts of digestive tract: Secondary | ICD-10-CM | POA: Diagnosis not present

## 2017-12-24 DIAGNOSIS — K5900 Constipation, unspecified: Secondary | ICD-10-CM | POA: Diagnosis not present

## 2018-01-02 NOTE — Telephone Encounter (Signed)
Follow up   Called patient left message to schedule appt.

## 2018-01-09 DIAGNOSIS — H35362 Drusen (degenerative) of macula, left eye: Secondary | ICD-10-CM | POA: Diagnosis not present

## 2018-01-09 DIAGNOSIS — H353231 Exudative age-related macular degeneration, bilateral, with active choroidal neovascularization: Secondary | ICD-10-CM | POA: Diagnosis not present

## 2018-01-09 DIAGNOSIS — H353211 Exudative age-related macular degeneration, right eye, with active choroidal neovascularization: Secondary | ICD-10-CM | POA: Diagnosis not present

## 2018-01-16 DIAGNOSIS — H353221 Exudative age-related macular degeneration, left eye, with active choroidal neovascularization: Secondary | ICD-10-CM | POA: Diagnosis not present

## 2018-01-29 ENCOUNTER — Encounter: Payer: Self-pay | Admitting: Physical Therapy

## 2018-01-29 ENCOUNTER — Other Ambulatory Visit: Payer: Self-pay

## 2018-01-29 ENCOUNTER — Ambulatory Visit: Payer: Medicare Other | Attending: Internal Medicine | Admitting: Physical Therapy

## 2018-01-29 DIAGNOSIS — R42 Dizziness and giddiness: Secondary | ICD-10-CM | POA: Insufficient documentation

## 2018-01-29 DIAGNOSIS — R2681 Unsteadiness on feet: Secondary | ICD-10-CM | POA: Insufficient documentation

## 2018-01-29 DIAGNOSIS — R293 Abnormal posture: Secondary | ICD-10-CM | POA: Diagnosis not present

## 2018-01-29 DIAGNOSIS — R2689 Other abnormalities of gait and mobility: Secondary | ICD-10-CM | POA: Insufficient documentation

## 2018-01-29 NOTE — Therapy (Signed)
[facility] [address] [place], [place], [address] Phone: [phone]   Fax:  [phone]  Physical Therapy Evaluation  Patient Details  Name: Shelly Obrien MRN: [id number] Date of Birth: 1922-06-18 Referring Provider: [name], MD   Encounter Date: 01/29/2018  PT End of Session - 01/29/18 1632    Visit Number  1    Number of Visits  17    Date for PT Re-Evaluation  03/30/18    Authorization Type  Medicare A/B    Authorization Time Period  01/29/18 to 04/29/2018    PT Start Time  1022    PT Stop Time  1110    PT Time Calculation (min)  48 min    Activity Tolerance  Patient tolerated treatment well    Behavior During Therapy  [facility] for tasks assessed/performed       Past Medical History:  Diagnosis Date  . CAD S/P percutaneous coronary angioplasty    hx MI 04/ 1994  s/p  PCI to LAD  . CKD (chronic kidney disease), stage III ([place])   . Complication of anesthesia    hard to wake   . History of basal cell carcinoma (BCC) excision    right eye area 08/ 2013  . History of kidney stones 01/20/2017  . History of MI (myocardial infarction) 09/1992   s/p  PCI to LAD  . History of squamous cell carcinoma excision    right lower leg 2015 ;  2016  . Hyperlipidemia   . Hypertension   . Macular degeneration   . PAF (paroxysmal atrial fibrillation) ([place]) dx 02-24-2016   followed by cardiologist-- dr Mamie Nick. [name]  . PONV (postoperative nausea and vomiting)   . Right ureteral stone   . Wears glasses     Past Surgical History:  Procedure Laterality Date  . ABDOMINAL HYSTERECTOMY    . BREAST EXCISIONAL BIOPSY Left 11/02/2009   fibroadenoma (lumpectomy)  . BROW LIFT Right 04/07/2014   Procedure: REPAIR OF ENTROPION AND TRICHIASIS OF RIGHT LOWER EYE LID ;  Surgeon: Theodoro Kos, DO;  Location: [facility];  Service: Plastics;  Laterality: Right;  . CARDIOVERSION N/A 07/18/2017   Procedure: CARDIOVERSION;   Surgeon: Skeet Latch, MD;  Location: [facility];  Service: Cardiovascular;  Laterality: N/A;  . CATARACT EXTRACTION W/ INTRAOCULAR LENS  IMPLANT, BILATERAL    . CHOLECYSTECTOMY N/A 12/06/2017   Procedure: LAPAROSCOPIC CHOLECYSTECTOMY;  Surgeon: Jovita Kussmaul, MD;  Location: [facility];  Service: General;  Laterality: N/A;  . CORONARY ANGIOPLASTY  04/ 1994  dr Lia Foyer   PCI to LAD  . CYSTOSCOPY W/ URETERAL STENT PLACEMENT Right 01/20/2017   Procedure: CYSTOSCOPY WITH RIGHT RETROGRADE PYELOGRAM/RIGHT URETERAL STENT PLACEMENT;  Surgeon: Alexis Frock, MD;  Location: [facility];  Service: Urology;  Laterality: Right;  . CYSTOSCOPY WITH RETROGRADE PYELOGRAM, URETEROSCOPY AND STENT PLACEMENT Right 02/15/2017   Procedure: CYSTOSCOPY WITH RETROGRADE PYELOGRAM, URETEROSCOPY, STONE BASKETRY AND STENT REPLACEMENT;  Surgeon: Alexis Frock, MD;  Location: [facility];  Service: Urology;  Laterality: Right;  . CYSTOSCOPY/RETROGRADE/URETEROSCOPY/STONE EXTRACTION WITH BASKET  yrs ago  . DILATION AND CURETTAGE OF UTERUS    . HOLMIUM LASER APPLICATION Right 6/96/2952   Procedure: HOLMIUM LASER APPLICATION;  Surgeon: Alexis Frock, MD;  Location: [facility];  Service: Urology;  Laterality: Right;  . KNEE SURGERY     right  . NEUROPLASTY / TRANSPOSITION ULNAR NERVE AT ELBOW Left 02-23-2000    [facility]   and  Left Carpal Tunnel Release  . TRANSTHORACIC ECHOCARDIOGRAM  02/26/2016   moderate focal basal and mild concentric LVH,  ef 55-60%,  akinesis of the basal-midanteroseptal, inferoseptal and apicalinferior myocardium, study not sufficient to evaluation diastolic funciton due to atrial fib./  trivial PR/  mild TR    There were no vitals filed for this visit.   Subjective Assessment - 01/29/18 1028    Subjective  Dr. Laurann [place] wanted me to take PT due to my imbalance and didn't want to give me a cane. I went and got one anyway and use it some time at home. If I look up or lay back to  get my hair washed then things look blurry--not spinning but maybe moving. They're blurry and it's hard to say.     Pertinent History  macular degeneration, CAD, HTN, PAF on Eliquis, CKD, excision skin cancer under right eye    Patient Stated Goals  walk without the cane; be able to walk to the mailbox    Currently in Pain?  No/denies         [facility] PT Assessment - 01/29/18 1035      Assessment   Medical Diagnosis  gait disorder;physical deconditioning    Referring Provider  [name], MD    Onset Date/Surgical Date  --   referred 01/08/18   Prior Therapy  Jan '19 for vertigo and imbalance      Precautions   Precautions  Fall      Restrictions   Weight Bearing Restrictions  No      Balance Screen   Has the patient fallen in the past 6 months  Yes    How many times?  1   slipped on small carpet; she immediately removed it   Has the patient had a decrease in activity level because of a fear of falling?   No    Is the patient reluctant to leave their home because of a fear of falling?   No      Home Environment   Living Environment  Private residence    Living Arrangements  Alone    Type of [place] to enter    Entrance Stairs-Number of Steps  1    [place]  One level    [place] - quad;Cane - single point;Walker - 2 wheels    Additional Comments  just recently bought quad cane      Prior Function   Level of Independence  Independent    Vocation  Retired    Leisure  loves to work in the yard; loves to clean; sings in prisons and NH      Cognition   Overall Cognitive Status  Within Functional Limits for tasks assessed      Observation/Other Assessments   Focus on Therapeutic Outcomes (FOTO)   not set up      Sensation   Light Touch  Impaired by gross assessment   reports decreased in stocking pattern     Coordination   Gross Motor Movements are Fluid and Coordinated  Yes    Fine Motor Movements are Fluid and Coordinated   Yes    Heel Shin Test  WNL      Posture/Postural Control   Posture/Postural Control  Postural limitations    Postural Limitations  Rounded Shoulders;Forward head;Increased thoracic kyphosis      Transfers   Transfers  Sit to Stand;Stand to Sit    Sit  to Stand  6: Modified independent (Device/Increase time)    Stand to Sit  6: Modified independent (Device/Increase time)      Ambulation/Gait   Ambulation/Gait  Yes    Ambulation/Gait Assistance  4: Min guard;4: Min assist    Ambulation/Gait Assistance Details  looking straight ahead, she drifts slightly with wide BOS; with head turn to her left +staggering to right with min assist to prevent fall; educated in use of SBQC with pt having difficulty coordinating at slower speed to allow full contact with the floor; improved with SPC and educated her on where to purchase a similar cane (Advanced) and function, cost of rubber quad tip to put on SPC ([employer])   Ambulation Distance (Feet)  40 Feet   80, 80   Assistive device  Straight cane;Small based quad cane;None    Gait Pattern  Step-through pattern;Decreased arm swing - right;Decreased arm swing - left;Decreased stride length;Right foot flat;Left foot flat;Right flexed knee in stance;Left flexed knee in stance;Decreased trunk rotation;Trunk flexed;Wide base of support;Poor foot clearance - left;Poor foot clearance - right    Ambulation Surface  Level;Indoor      Standardized Balance Assessment   Standardized Balance Assessment  Dynamic Gait Index      Dynamic Gait Index   Level Surface  Mild Impairment    Change in Gait Speed  Mild Impairment    Gait with Horizontal Head Turns  Severe Impairment    Gait with Vertical Head Turns  Severe Impairment    Gait and Pivot Turn  Mild Impairment    Step Over Obstacle  Mild Impairment    Step Around Obstacles  Normal    Steps  Mild Impairment    Total Score  13    DGI comment:  07/2017 16/28           Vestibular Assessment - 01/29/18 1626       Vestibular Assessment   General Observation  no symptoms at rest "if I'm sitting, I'm fine...unless I look up"      Symptom Behavior   Type of Dizziness  Imbalance    Frequency of Dizziness  daily    Duration of Dizziness  seconds to minutes    Aggravating Factors  Looking up to the ceiling    Relieving Factors  Closing eyes;Rest;Slow movements      Occulomotor Exam   Occulomotor Alignment  Abnormal   rt eye abducted at times; +macular degener'n; denies diplopi   Spontaneous  Absent    Gaze-induced  Absent    Smooth Pursuits  Intact    Saccades  Poor trajectory   overshoots to her left consistently; to right normal     Vestibulo-Occular Reflex   Comment  HIT + to left, although difficult due to pt's limited neck ROM and guarding          Objective measurements completed on examination: See above findings.              PT Education - 01/29/18 1631    Education Details  results of PT assessment with high fall risk and need for assistive device; proper use of SBQC vs SPC; shown rubber quad tip she can purchase; PT POC    Person(s) Educated  Patient    Methods  Explanation;Demonstration;Tactile cues;Verbal cues    Comprehension  Verbalized understanding;Returned demonstration;Verbal cues required;Tactile cues required;Need further instruction       PT Short Term Goals - 01/29/18 1644      PT SHORT TERM GOAL #  1   Title  Assess gait velocity with cane with STG to be >1.81 ft/sec (indicative of lesser fall risk) (Target all STGs 02/28/2018)    Time  4    Period  Weeks    Status  New    Target Date  02/28/18      PT SHORT TERM GOAL #2   Title  Patient will improve DGI score by 3 points ([place])    Baseline  01/29/18 13/28    Time  4    Period  Weeks    Status  New      PT SHORT TERM GOAL #3   Title  Patient will be independent with HEP for balance and strengthening    Time  4    Period  Weeks    Status  New        PT Long Term Goals - 01/29/18 1648       PT LONG TERM GOAL #1   Title  independent with updated HEP (target all LTGs 03/30/2018)    Time  8    Period  Weeks    Status  New    Target Date  03/30/18      PT LONG TERM GOAL #2   Title  Patient will ambulate 200 feet with LRAD modified independent on level surfaces with head turns over 40 ft of the entire distance    Time  8    Period  Weeks    Status  New      PT LONG TERM GOAL #3   Title  improve DGI score to >=19/28 to demonstrate lesser fall risk    Time  8    Period  Weeks    Status  New      PT LONG TERM GOAL #4   Title  Patient will improve gait velocity to >=2.62 ft/sec for safe community ambulation    Time  8    Period  Weeks    Status  New             Plan - 01/29/18 1634    Clinical Impression Statement  Patient referred to OPPT by PCP due to gait disorder and physical deconditioning. Patient was seen by OPPT at this clinic in January '19 for vertigo and found to have very restricted neck ROM with trigger point areas being addressed with dry needling and manual therapy. She reports her current gait problems are due to imbalance/vertigo. She demonstrated high fall risk with score of 13/28 on DGI (<19 indicative of high fall risk) and required min assist to regain balance when walking with head turns. Anticipate patient can benefit from the PT interventions listed below to reduce fall risk, however may be slower than usual progress due to her neck limitations and pain.     History and Personal Factors relevant to plan of care:  PMH-CAD, HTN, PAF on Eliquis, CKD, macular degeneration, cholecystectomy & umbilical hernia repair 02/06/84    Clinical Presentation  Stable    Clinical Presentation due to:  longstanding symptoms    Clinical Decision Making  Low    Rehab Potential  Fair    Clinical Impairments Affecting Rehab Potential  macular degeneration and limited cervical ROM can impact abiity to do vestibular rehab exercises; pt with bil LE sensory loss  impacting ability to maintain balance    PT Frequency  2x / week    PT Duration  8 weeks    PT Treatment/Interventions  ADLs/Self Care Home Management;Gait  training;DME Instruction;Functional mobility training;Therapeutic activities;Therapeutic exercise;Balance training;Neuromuscular re-education;Manual techniques;Patient/family education;Passive range of motion;Dry needling;Vestibular;Visual/perceptual remediation/compensation    PT Next Visit Plan  did she buy a new cane and quad rubber tip?; measure gait velocity with cane; initiate HEP (?can do VORx1 with macular degeneration?) hip ex's with alternating legs at counter; ?corner ex's if she can do safely (lives alone), leg strengthening    Consulted and Agree with Plan of Care  Patient       Patient will benefit from skilled therapeutic intervention in order to improve the following deficits and impairments:  Abnormal gait, Decreased activity tolerance, Decreased balance, Decreased mobility, Decreased knowledge of use of DME, Decreased knowledge of precautions, Decreased safety awareness, Decreased strength, Dizziness, Increased fascial restricitons, Increased muscle spasms, Impaired flexibility, Impaired sensation, Postural dysfunction, Impaired vision/preception  Visit Diagnosis: Abnormal posture - Plan: PT plan of care cert/re-cert  Other abnormalities of gait and mobility - Plan: PT plan of care cert/re-cert  Dizziness and giddiness - Plan: PT plan of care cert/re-cert  Unsteadiness on feet - Plan: PT plan of care cert/re-cert     Problem List Patient Active Problem List   Diagnosis Date Noted  . Constipation   . Acute on chronic cholecystitis s/p lap cholecystectomy 12/06/2017 12/04/2017  . Nephrolithiasis 12/04/2017  . Abdominal pain 12/04/2017  . Spinal stenosis in cervical region 04/24/2017  . Spinal stenosis of lumbar region 04/24/2017  . Dizziness 04/24/2017  . Ureteral stone with hydronephrosis 01/20/2017  . Atrial  fibrillation with RVR ([place]) 02/24/2016  . CKD (chronic kidney disease) stage 3, GFR 30-59 ml/min ([facility]) 02/24/2016  . Paroxysmal atrial fibrillation ([place]) 02/24/2016  . Exudative macular degeneration ([place]) 02/24/2016  . CHOLELITHIASIS 02/14/2009  . HYPERLIPIDEMIA 12/29/2008  . Essential hypertension 12/29/2008  . MYOCARDIAL INFARCTION, HX OF 12/29/2008  . CAD (coronary artery disease) 12/29/2008    Rexanne Mano, PT 01/29/2018, 4:55 PM  [facility] [address] [place], [place], [address] Phone: [phone]   Fax:  [phone]  Name: Shelly Obrien MRN: [id number] Date of Birth: July 13, 1922

## 2018-02-07 ENCOUNTER — Encounter

## 2018-02-10 ENCOUNTER — Encounter: Payer: Self-pay | Admitting: Physical Therapy

## 2018-02-10 ENCOUNTER — Ambulatory Visit: Payer: Medicare Other | Attending: Internal Medicine | Admitting: Physical Therapy

## 2018-02-10 DIAGNOSIS — M25561 Pain in right knee: Secondary | ICD-10-CM | POA: Diagnosis not present

## 2018-02-10 DIAGNOSIS — R293 Abnormal posture: Secondary | ICD-10-CM | POA: Insufficient documentation

## 2018-02-10 DIAGNOSIS — R2681 Unsteadiness on feet: Secondary | ICD-10-CM | POA: Diagnosis not present

## 2018-02-10 DIAGNOSIS — G8929 Other chronic pain: Secondary | ICD-10-CM | POA: Insufficient documentation

## 2018-02-10 DIAGNOSIS — M545 Low back pain: Secondary | ICD-10-CM | POA: Diagnosis not present

## 2018-02-10 DIAGNOSIS — R42 Dizziness and giddiness: Secondary | ICD-10-CM

## 2018-02-10 DIAGNOSIS — R2689 Other abnormalities of gait and mobility: Secondary | ICD-10-CM | POA: Diagnosis not present

## 2018-02-10 NOTE — Patient Instructions (Addendum)
   Gaze Stabilization: Tip Card  1.Target must remain in focus, not blurry, and appear stationary while head is in motion. 2.Perform exercises with small head movements (45 to either side of midline). 3.Increase speed of head motion so long as target is in focus. 4.If you wear eyeglasses, be sure you can see target through lens (therapist will give specific instructions for bifocal / progressive lenses). 5.These exercises may provoke dizziness or nausea. Work through these symptoms. If too dizzy, slow head movement slightly. Rest between each exercise. 6.Exercises demand concentration; avoid distractions.  Copyright  VHI. All rights reserved.     Special Instructions: Exercises may bring on mild to moderate symptoms of dizziness that resolve within 30 minutes of completing exercises. If symptoms are lasting longer than 30 minutes, modify your exercises by:  >decreasing the # of times you complete each activity >ensuring your symptoms return to baseline before moving onto the next exercise >dividing up exercises so you do not do them all in one session, but multiple short sessions throughout the day >doing them once a day until symptoms improve   Gaze Stabilization: Sitting    Keeping eyes on target on wall 3 feet away, and move head side to side for _30__ seconds. Repeat while moving head up and down for __30__ seconds.Repeat each direction 1 more time.  Do __2-3__ sessions per day.        Access Code: [email or web address]  URL: [email or web address]  Date: 02/10/2018  Prepared by: Barry Brunner   Exercises  Standing Marching - 20 reps - 1 sets - 1x daily - 5x weekly  Standing Hip Abduction with Counter Support - 20 reps - 1 sets - 3 seconds hold - 1x daily - 5x weekly  Standing Single Leg Stance with Unilateral Counter Support - 3 reps - 1 sets - 10 hold - 1x daily - 5x weekly

## 2018-02-10 NOTE — Therapy (Signed)
[facility] [address] [place], [place], [address] Phone: [phone]   Fax:  [phone]  Physical Therapy Treatment  Patient Details  Name: Shelly Obrien MRN: [id number] Date of Birth: 26-Dec-1922 Referring Provider: [name], MD   Encounter Date: 02/10/2018  PT End of Session - 02/10/18 2006    Visit Number  2    Number of Visits  17    Date for PT Re-Evaluation  03/30/18    Authorization Type  Medicare A/B    Authorization Time Period  01/29/18 to 04/29/2018    PT Start Time  1104    PT Stop Time  1148    PT Time Calculation (min)  44 min    Activity Tolerance  Patient tolerated treatment well    Behavior During Therapy  [facility] for tasks assessed/performed       Past Medical History:  Diagnosis Date  . CAD S/P percutaneous coronary angioplasty    hx MI 04/ 1994  s/p  PCI to LAD  . CKD (chronic kidney disease), stage III ([place])   . Complication of anesthesia    hard to wake   . History of basal cell carcinoma (BCC) excision    right eye area 08/ 2013  . History of kidney stones 01/20/2017  . History of MI (myocardial infarction) 09/1992   s/p  PCI to LAD  . History of squamous cell carcinoma excision    right lower leg 2015 ;  2016  . Hyperlipidemia   . Hypertension   . Macular degeneration   . PAF (paroxysmal atrial fibrillation) ([place]) dx 02-24-2016   followed by cardiologist-- dr Mamie Nick. [name]  . PONV (postoperative nausea and vomiting)   . Right ureteral stone   . Wears glasses     Past Surgical History:  Procedure Laterality Date  . ABDOMINAL HYSTERECTOMY    . BREAST EXCISIONAL BIOPSY Left 11/02/2009   fibroadenoma (lumpectomy)  . BROW LIFT Right 04/07/2014   Procedure: REPAIR OF ENTROPION AND TRICHIASIS OF RIGHT LOWER EYE LID ;  Surgeon: Theodoro Kos, DO;  Location: [place];  Service: Plastics;  Laterality: Right;  . CARDIOVERSION N/A 07/18/2017   Procedure: CARDIOVERSION;   Surgeon: Skeet Latch, MD;  Location: [place];  Service: Cardiovascular;  Laterality: N/A;  . CATARACT EXTRACTION W/ INTRAOCULAR LENS  IMPLANT, BILATERAL    . CHOLECYSTECTOMY N/A 12/06/2017   Procedure: LAPAROSCOPIC CHOLECYSTECTOMY;  Surgeon: Jovita Kussmaul, MD;  [facility];  Service: General;  Laterality: N/A;  . CORONARY ANGIOPLASTY  04/ 1994  dr Lia Foyer   PCI to LAD  . CYSTOSCOPY W/ URETERAL STENT PLACEMENT Right 01/20/2017   Procedure: CYSTOSCOPY WITH RIGHT RETROGRADE PYELOGRAM/RIGHT URETERAL STENT PLACEMENT;  Surgeon: Alexis Frock, MD;  [facility];  Service: Urology;  Laterality: Right;  . CYSTOSCOPY WITH RETROGRADE PYELOGRAM, URETEROSCOPY AND STENT PLACEMENT Right 02/15/2017   Procedure: CYSTOSCOPY WITH RETROGRADE PYELOGRAM, URETEROSCOPY, STONE BASKETRY AND STENT REPLACEMENT;  Surgeon: Alexis Frock, MD;  Location: [facility];  Service: Urology;  Laterality: Right;  . CYSTOSCOPY/RETROGRADE/URETEROSCOPY/STONE EXTRACTION WITH BASKET  yrs ago  . DILATION AND CURETTAGE OF UTERUS    . HOLMIUM LASER APPLICATION Right 6/96/2952   Procedure: HOLMIUM LASER APPLICATION;  Surgeon: Alexis Frock, MD;  Location: [facility];  Service: Urology;  Laterality: Right;  . KNEE SURGERY     right  . NEUROPLASTY / TRANSPOSITION ULNAR NERVE AT ELBOW Left 02-23-2000    [facility]   and  Left Carpal Tunnel Release  . TRANSTHORACIC ECHOCARDIOGRAM  02/26/2016   moderate focal basal and mild concentric LVH,  ef 55-60%,  akinesis of the basal-midanteroseptal, inferoseptal and apicalinferior myocardium, study not sufficient to evaluation diastolic funciton due to atrial fib./  trivial PR/  mild TR    There were no vitals filed for this visit.  Subjective Assessment - 02/10/18 1106    Subjective  Patient reports her eyes seem worse and make it hard to balance. She sees her eye doctor for more eye injections in October.    Pertinent History  macular degeneration, CAD,  HTN, PAF on Eliquis, CKD, excision skin cancer under right eye    Patient Stated Goals  walk without the cane; be able to walk to the mailbox    Currently in Pain?  No/denies                       [facility] Adult PT Treatment/Exercise - 02/10/18 1112      Ambulation/Gait   Ambulation/Gait Assistance  4: Min guard;4: Min assist    Ambulation/Gait Assistance Details  vc for safe use of SPC with quad rubber tip    Ambulation Distance (Feet)  150 Feet    Assistive device  Straight cane    Gait Pattern  Step-through pattern;Decreased arm swing - right;Decreased arm swing - left;Decreased stride length;Right foot flat;Left foot flat;Right flexed knee in stance;Left flexed knee in stance;Decreased trunk rotation;Trunk flexed;Wide base of support;Poor foot clearance - left;Poor foot clearance - right    Ambulation Surface  Level;Indoor    Gait velocity  32.8/15.78=2.08       Educated in initial HEP--required verbal, visual and tactile cues for proper technique. (see pt instructions for details)      PT Education - 02/10/18 2004    Education Details  cane she was sold is a bariatric cane and is much heavier than a std cane; recommend return cane for std cane; see HEP issued    Person(s) Educated  Patient    Methods  Explanation;Demonstration;Verbal cues;Handout    Comprehension  Verbalized understanding;Returned demonstration;Verbal cues required;Need further instruction       PT Short Term Goals - 02/10/18 2014      PT SHORT TERM GOAL #1   Title  Assess gait velocity with cane with STG to be >1.81 ft/sec (indicative of lesser fall risk) (Target all STGs 02/28/2018)    Baseline  9/9 2.08 ft/sec    Time  4    Period  Weeks    Status  Achieved      PT SHORT TERM GOAL #2   Title  Patient will improve DGI score by 3 points (Ali Molina)    Baseline  01/29/18 13/28    Time  4    Period  Weeks    Status  New      PT SHORT TERM GOAL #3   Title  Patient will be independent with HEP  for balance and strengthening    Time  4    Period  Weeks    Status  New        PT Long Term Goals - 01/29/18 1648      PT LONG TERM GOAL #1   Title  independent with updated HEP (target all LTGs 03/30/2018)    Time  8    Period  Weeks    Status  New    Target Date  03/30/18      PT LONG TERM  GOAL #2   Title  Patient will ambulate 200 feet with LRAD modified independent on level surfaces with head turns over 40 ft of the entire distance    Time  8    Period  Weeks    Status  New      PT LONG TERM GOAL #3   Title  improve DGI score to >=19/28 to demonstrate lesser fall risk    Time  8    Period  Weeks    Status  New      PT LONG TERM GOAL #4   Title  Patient will improve gait velocity to >=2.62 ft/sec for safe community ambulation    Time  8    Period  Weeks    Status  New            Plan - 02/10/18 2003    Clinical Impression Statement  Session focused on setting up pt's new cane with quad rubber tip she purchased since PT eval (and advising pt to exchange her cane as they sold her a bariatric cane). Gait training with cane with pt progressing quickly to understand sequencing with cane. Initiated training in HEP for improving balance (including vestibular rehab). pt very engaged and asking appropriate questions. Anticipate she will make good progress.     Rehab Potential  Fair    Clinical Impairments Affecting Rehab Potential  macular degeneration and limited cervical ROM can impact abiity to do vestibular rehab exercises; pt with bil LE sensory loss impacting ability to maintain balance    PT Frequency  2x / week    PT Duration  8 weeks    PT Treatment/Interventions  ADLs/Self Care Home Management;Gait training;DME Instruction;Functional mobility training;Therapeutic activities;Therapeutic exercise;Balance training;Neuromuscular re-education;Manual techniques;Patient/family education;Passive range of motion;Dry needling;Vestibular;Visual/perceptual  remediation/compensation    PT Next Visit Plan  check understanding of HEP from 9/9;  add to HEP as appropriate (?sit to stand vs bridging; ?corner ex's if she can do safely (lives alone)    PT Home Exercise Plan  Access Code: [email or web address]    Consulted and Agree with Plan of Care  Patient       Patient will benefit from skilled therapeutic intervention in order to improve the following deficits and impairments:  Abnormal gait, Decreased activity tolerance, Decreased balance, Decreased mobility, Decreased knowledge of use of DME, Decreased knowledge of precautions, Decreased safety awareness, Decreased strength, Dizziness, Increased fascial restricitons, Increased muscle spasms, Impaired flexibility, Impaired sensation, Postural dysfunction, Impaired vision/preception  Visit Diagnosis: Other abnormalities of gait and mobility  Dizziness and giddiness  Unsteadiness on feet     Problem List Patient Active Problem List   Diagnosis Date Noted  . Constipation   . Acute on chronic cholecystitis s/p lap cholecystectomy 12/06/2017 12/04/2017  . Nephrolithiasis 12/04/2017  . Abdominal pain 12/04/2017  . Spinal stenosis in cervical region 04/24/2017  . Spinal stenosis of lumbar region 04/24/2017  . Dizziness 04/24/2017  . Ureteral stone with hydronephrosis 01/20/2017  . Atrial fibrillation with RVR ([place]) 02/24/2016  . CKD (chronic kidney disease) stage 3, GFR 30-59 ml/min ([facility]) 02/24/2016  . Paroxysmal atrial fibrillation ([place]) 02/24/2016  . Exudative macular degeneration ([place]) 02/24/2016  . CHOLELITHIASIS 02/14/2009  . HYPERLIPIDEMIA 12/29/2008  . Essential hypertension 12/29/2008  . MYOCARDIAL INFARCTION, HX OF 12/29/2008  . CAD (coronary artery disease) 12/29/2008    Rexanne Mano, PT 02/10/2018, 8:16 PM  [facility] [address] [place], [place], [address] Phone: [phone]   Fax:  [place]  Name: Shelly Obrien MRN:  [id number] Date of Birth: [birth year]

## 2018-02-14 ENCOUNTER — Ambulatory Visit: Payer: Medicare Other | Admitting: Internal Medicine

## 2018-02-17 ENCOUNTER — Ambulatory Visit: Payer: Medicare Other | Admitting: Physical Therapy

## 2018-02-17 ENCOUNTER — Encounter: Payer: Self-pay | Admitting: Physical Therapy

## 2018-02-17 DIAGNOSIS — R42 Dizziness and giddiness: Secondary | ICD-10-CM | POA: Diagnosis not present

## 2018-02-17 DIAGNOSIS — G8929 Other chronic pain: Secondary | ICD-10-CM | POA: Diagnosis not present

## 2018-02-17 DIAGNOSIS — R293 Abnormal posture: Secondary | ICD-10-CM | POA: Diagnosis not present

## 2018-02-17 DIAGNOSIS — R2689 Other abnormalities of gait and mobility: Secondary | ICD-10-CM

## 2018-02-17 DIAGNOSIS — M25561 Pain in right knee: Secondary | ICD-10-CM | POA: Diagnosis not present

## 2018-02-17 DIAGNOSIS — R2681 Unsteadiness on feet: Secondary | ICD-10-CM | POA: Diagnosis not present

## 2018-02-17 NOTE — Patient Instructions (Signed)
Access Code: [email or web address]  URL: [email or web address]  Date: 02/17/2018  Prepared by: Barry Brunner   Exercises  Standing Marching - 20 reps - 1 sets - 1x daily - 5x weekly  Standing Hip Abduction with Counter Support - 20 reps - 1 sets - 3 seconds hold - 1x daily - 5x weekly  Standing Single Leg Stance with Unilateral Counter Support - 3 reps - 1 sets - 10 hold - 1x daily - 5x weekly  Romberg Stance with Head Nods - 1 sets - 1x daily - 7x weekly

## 2018-02-18 NOTE — Therapy (Signed)
[facility] [address] [place], [place], [address] Phone: [phone]   Fax:  [phone]  Physical Therapy Treatment  Patient Details  Name: Shelly Obrien MRN: [id number] Date of Birth: 1922/10/10 Referring Provider: [name], MD   Encounter Date: 02/17/2018  PT End of Session - 02/17/18 0523    Visit Number  3    Number of Visits  17    Date for PT Re-Evaluation  03/30/18    Authorization Type  Medicare A/B    Authorization Time Period  01/29/18 to 04/29/2018    PT Start Time  1450    PT Stop Time  1532    PT Time Calculation (min)  42 min    Activity Tolerance  Patient tolerated treatment well    Behavior During Therapy  [facility] for tasks assessed/performed       Past Medical History:  Diagnosis Date  . CAD S/P percutaneous coronary angioplasty    hx MI 04/ 1994  s/p  PCI to LAD  . CKD (chronic kidney disease), stage III ([place])   . Complication of anesthesia    hard to wake   . History of basal cell carcinoma (BCC) excision    right eye area 08/ 2013  . History of kidney stones 01/20/2017  . History of MI (myocardial infarction) 09/1992   s/p  PCI to LAD  . History of squamous cell carcinoma excision    right lower leg 2015 ;  2016  . Hyperlipidemia   . Hypertension   . Macular degeneration   . PAF (paroxysmal atrial fibrillation) ([place]) dx 02-24-2016   followed by cardiologist-- dr Mamie Nick. [name]  . PONV (postoperative nausea and vomiting)   . Right ureteral stone   . Wears glasses     Past Surgical History:  Procedure Laterality Date  . ABDOMINAL HYSTERECTOMY    . BREAST EXCISIONAL BIOPSY Left 11/02/2009   fibroadenoma (lumpectomy)  . BROW LIFT Right 04/07/2014   Procedure: REPAIR OF ENTROPION AND TRICHIASIS OF RIGHT LOWER EYE LID ;  Surgeon: Theodoro Kos, DO;  Location: [place];  Service: Plastics;  Laterality: Right;  . CARDIOVERSION N/A 07/18/2017   Procedure: CARDIOVERSION;   Surgeon: Skeet Latch, MD;  Location: [place];  Service: Cardiovascular;  Laterality: N/A;  . CATARACT EXTRACTION W/ INTRAOCULAR LENS  IMPLANT, BILATERAL    . CHOLECYSTECTOMY N/A 12/06/2017   Procedure: LAPAROSCOPIC CHOLECYSTECTOMY;  Surgeon: Jovita Kussmaul, MD;  [facility];  Service: General;  Laterality: N/A;  . CORONARY ANGIOPLASTY  04/ 1994  dr Lia Foyer   PCI to LAD  . CYSTOSCOPY W/ URETERAL STENT PLACEMENT Right 01/20/2017   Procedure: CYSTOSCOPY WITH RIGHT RETROGRADE PYELOGRAM/RIGHT URETERAL STENT PLACEMENT;  Surgeon: Alexis Frock, MD;  [facility];  Service: Urology;  Laterality: Right;  . CYSTOSCOPY WITH RETROGRADE PYELOGRAM, URETEROSCOPY AND STENT PLACEMENT Right 02/15/2017   Procedure: CYSTOSCOPY WITH RETROGRADE PYELOGRAM, URETEROSCOPY, STONE BASKETRY AND STENT REPLACEMENT;  Surgeon: Alexis Frock, MD;  Location: [facility];  Service: Urology;  Laterality: Right;  . CYSTOSCOPY/RETROGRADE/URETEROSCOPY/STONE EXTRACTION WITH BASKET  yrs ago  . DILATION AND CURETTAGE OF UTERUS    . HOLMIUM LASER APPLICATION Right 3/47/4259   Procedure: HOLMIUM LASER APPLICATION;  Surgeon: Alexis Frock, MD;  Location: [facility];  Service: Urology;  Laterality: Right;  . KNEE SURGERY     right  . NEUROPLASTY / TRANSPOSITION ULNAR NERVE AT ELBOW Left 02-23-2000    [facility]   and  Left Carpal Tunnel Release  . TRANSTHORACIC ECHOCARDIOGRAM  02/26/2016   moderate focal basal and mild concentric LVH,  ef 55-60%,  akinesis of the basal-midanteroseptal, inferoseptal and apicalinferior myocardium, study not sufficient to evaluation diastolic funciton due to atrial fib./  trivial PR/  mild TR    There were no vitals filed for this visit.  Subjective Assessment - 02/17/18 1452    Subjective  Pt reports she replied to an ad in the newspaper for "electric therapy" for her neuropathy. They wanted to charge her $5000.00 and her MD told her not to do it.      Pertinent History  macular degeneration, CAD, HTN, PAF on Eliquis, CKD, excision skin cancer under right eye    Patient Stated Goals  walk without the cane; be able to walk to the mailbox    Currently in Pain?  No/denies                        Vestibular Treatment/Exercise - 02/18/18 0001      Vestibular Treatment/Exercise   Vestibular Treatment Provided  Gaze    Gaze Exercises  X1 Viewing Horizontal;X1 Viewing Vertical      X1 Viewing Horizontal   Foot Position  seated    Time  --   up to 20 sec   Reps  4    Comments  working on less degree of head turn and faster speed; pt reports target begins to split and move side to side      X1 Viewing Vertical   Foot Position  seated    Time  --   up to 20 sec   Reps  2    Comments  target begins to split/ have a shadow on one side         Balance Exercises - 02/17/18 1700      Balance Exercises: Standing   Standing Eyes Opened  Narrow base of support (BOS);Wide (BOA);Head turns;Solid surface    Standing Eyes Closed  Narrow base of support (BOS);Wide (BOA);Solid surface      Gait training with head turns (scanning environment for different objects) x 200 ft, x 120 ft with SPC and only one occurrence of imbalance and pt able to recover without assist).   PT Education - 02/17/18 1700    Education Details  corrections to how to do VORx1; additions to HEP    Person(s) Educated  Patient    Methods  Explanation;Demonstration;Verbal cues;Handout    Comprehension  Verbalized understanding;Returned demonstration;Verbal cues required;Need further instruction       PT Short Term Goals - 02/10/18 2014      PT SHORT TERM GOAL #1   Title  Assess gait velocity with cane with STG to be >1.81 ft/sec (indicative of lesser fall risk) (Target all STGs 02/28/2018)    Baseline  9/9 2.08 ft/sec    Time  4    Period  Weeks    Status  Achieved      PT SHORT TERM GOAL #2   Title  Patient will improve DGI score by 3 points ([place])     Baseline  01/29/18 13/28    Time  4    Period  Weeks    Status  New      PT SHORT TERM GOAL #3   Title  Patient will be independent with HEP for balance and strengthening    Time  4    Period  Weeks    Status  New        PT Long Term Goals - 01/29/18 1648      PT LONG TERM GOAL #1   Title  independent with updated HEP (target all LTGs 03/30/2018)    Time  8    Period  Weeks    Status  New    Target Date  03/30/18      PT LONG TERM GOAL #2   Title  Patient will ambulate 200 feet with LRAD modified independent on level surfaces with head turns over 40 ft of the entire distance    Time  8    Period  Weeks    Status  New      PT LONG TERM GOAL #3   Title  improve DGI score to >=19/28 to demonstrate lesser fall risk    Time  8    Period  Weeks    Status  New      PT LONG TERM GOAL #4   Title  Patient will improve gait velocity to >=2.62 ft/sec for safe community ambulation    Time  8    Period  Weeks    Status  New            Plan - 02/18/18 0525    Clinical Impression Statement  Session focused on vestibular rehab with balance and gaze exercises to stimulate use of vestibulr system. Pt very attentive and able to make corrections to improve her techniique with verbal cues and demonstration. She reports despite macular degeneration, she can see the target clearly during VORx1 exercise through most of her head turns (blurs at midline and as fatigues begins to double in her right field of vision). Able to make corrections to her technique for HEP and add corner balance exercise to HEP    Rehab Potential  Fair    Clinical Impairments Affecting Rehab Potential  macular degeneration and limited cervical ROM can impact abiity to do vestibular rehab exercises; pt with bil LE sensory loss impacting ability to maintain balance    PT Frequency  2x / week    PT Duration  8 weeks    PT Treatment/Interventions  ADLs/Self Care Home Management;Gait training;DME  Instruction;Functional mobility training;Therapeutic activities;Therapeutic exercise;Balance training;Neuromuscular re-education;Manual techniques;Patient/family education;Passive range of motion;Dry needling;Vestibular;Visual/perceptual remediation/compensation    PT Next Visit Plan  check understanding of HEP from 9/16;  add to HEP as appropriate (?sit to stand no UEs; hip kicks at counter, etc)    PT Home Exercise Plan  Access Code: [email or web address]    Consulted and Agree with Plan of Care  Patient       Patient will benefit from skilled therapeutic intervention in order to improve the following deficits and impairments:  Abnormal gait, Decreased activity tolerance, Decreased balance, Decreased mobility, Decreased knowledge of use of DME, Decreased knowledge of precautions, Decreased safety awareness, Decreased strength, Dizziness, Increased fascial restricitons, Increased muscle spasms, Impaired flexibility, Impaired sensation, Postural dysfunction, Impaired vision/preception  Visit Diagnosis: Other abnormalities of gait and mobility  Dizziness and giddiness  Unsteadiness on feet     Problem List Patient Active Problem List   Diagnosis Date Noted  . Constipation   . Acute on chronic cholecystitis s/p lap cholecystectomy 12/06/2017 12/04/2017  . Nephrolithiasis 12/04/2017  . Abdominal pain 12/04/2017  . Spinal stenosis in cervical region 04/24/2017  . Spinal stenosis of lumbar region 04/24/2017  . Dizziness 04/24/2017  . Ureteral stone with hydronephrosis 01/20/2017  . Atrial fibrillation with RVR ([place]) 02/24/2016  .  CKD (chronic kidney disease) stage 3, GFR 30-59 ml/min ([facility]) 02/24/2016  . Paroxysmal atrial fibrillation ([place]) 02/24/2016  . Exudative macular degeneration ([place]) 02/24/2016  . CHOLELITHIASIS 02/14/2009  . HYPERLIPIDEMIA 12/29/2008  . Essential hypertension 12/29/2008  . MYOCARDIAL INFARCTION, HX OF 12/29/2008  . CAD (coronary artery disease) 12/29/2008    Rexanne Mano, PT 02/18/2018, 5:30 AM  [facility] [address] [place], [place], [address] Phone: [phone]   Fax:  [phone]  Name: Shelly Obrien MRN: [id number] Date of Birth: 1922-11-06

## 2018-02-19 ENCOUNTER — Ambulatory Visit: Payer: Medicare Other | Admitting: Physical Therapy

## 2018-02-19 ENCOUNTER — Encounter: Payer: Self-pay | Admitting: Physical Therapy

## 2018-02-19 VITALS — BP 160/90 | HR 80

## 2018-02-19 DIAGNOSIS — R42 Dizziness and giddiness: Secondary | ICD-10-CM

## 2018-02-19 DIAGNOSIS — R2681 Unsteadiness on feet: Secondary | ICD-10-CM

## 2018-02-19 DIAGNOSIS — R293 Abnormal posture: Secondary | ICD-10-CM

## 2018-02-19 DIAGNOSIS — M25561 Pain in right knee: Secondary | ICD-10-CM | POA: Diagnosis not present

## 2018-02-19 DIAGNOSIS — R2689 Other abnormalities of gait and mobility: Secondary | ICD-10-CM | POA: Diagnosis not present

## 2018-02-19 DIAGNOSIS — G8929 Other chronic pain: Secondary | ICD-10-CM

## 2018-02-19 DIAGNOSIS — M545 Low back pain, unspecified: Secondary | ICD-10-CM

## 2018-02-19 NOTE — Therapy (Signed)
[facility] [address] [place], [place], [address] Phone: [phone]   Fax:  [phone]  Physical Therapy Treatment  Patient Details  Name: Shelly Obrien MRN: [id number] Date of Birth: [birth year] Referring Provider: [name], MD   Encounter Date: 02/19/2018  PT End of Session - 02/19/18 1540    Visit Number  4    Number of Visits  17    Date for PT Re-Evaluation  03/30/18    Authorization Type  Medicare A/B    Authorization Time Period  01/29/18 to 04/29/2018    PT Start Time  1445    PT Stop Time  1530    PT Time Calculation (min)  45 min    Activity Tolerance  Patient tolerated treatment well    Behavior During Therapy  [facility] for tasks assessed/performed       Past Medical History:  Diagnosis Date  . CAD S/P percutaneous coronary angioplasty    hx MI 04/ 1994  s/p  PCI to LAD  . CKD (chronic kidney disease), stage III ([place])   . Complication of anesthesia    hard to wake   . History of basal cell carcinoma (BCC) excision    right eye area 08/ 2013  . History of kidney stones 01/20/2017  . History of MI (myocardial infarction) 09/1992   s/p  PCI to LAD  . History of squamous cell carcinoma excision    right lower leg 2015 ;  2016  . Hyperlipidemia   . Hypertension   . Macular degeneration   . PAF (paroxysmal atrial fibrillation) ([place]) dx 02-24-2016   followed by cardiologist-- dr Mamie Nick. [name]  . PONV (postoperative nausea and vomiting)   . Right ureteral stone   . Wears glasses     Past Surgical History:  Procedure Laterality Date  . ABDOMINAL HYSTERECTOMY    . BREAST EXCISIONAL BIOPSY Left 11/02/2009   fibroadenoma (lumpectomy)  . BROW LIFT Right 04/07/2014   Procedure: REPAIR OF ENTROPION AND TRICHIASIS OF RIGHT LOWER EYE LID ;  Surgeon: Theodoro Kos, DO;  Location: [facility];  Service: Plastics;  Laterality: Right;  . CARDIOVERSION N/A 07/18/2017   Procedure: CARDIOVERSION;   Surgeon: Skeet Latch, MD;  Location: [facility];  Service: Cardiovascular;  Laterality: N/A;  . CATARACT EXTRACTION W/ INTRAOCULAR LENS  IMPLANT, BILATERAL    . CHOLECYSTECTOMY N/A 12/06/2017   Procedure: LAPAROSCOPIC CHOLECYSTECTOMY;  Surgeon: Jovita Kussmaul, MD;  [facility];  Service: General;  Laterality: N/A;  . CORONARY ANGIOPLASTY  04/ 1994  dr Lia Foyer   PCI to LAD  . CYSTOSCOPY W/ URETERAL STENT PLACEMENT Right 01/20/2017   Procedure: CYSTOSCOPY WITH RIGHT RETROGRADE PYELOGRAM/RIGHT URETERAL STENT PLACEMENT;  Surgeon: Alexis Frock, MD;  [facility];  Service: Urology;  Laterality: Right;  . CYSTOSCOPY WITH RETROGRADE PYELOGRAM, URETEROSCOPY AND STENT PLACEMENT Right 02/15/2017   Procedure: CYSTOSCOPY WITH RETROGRADE PYELOGRAM, URETEROSCOPY, STONE BASKETRY AND STENT REPLACEMENT;  Surgeon: Alexis Frock, MD;  Location: [facility];  Service: Urology;  Laterality: Right;  . CYSTOSCOPY/RETROGRADE/URETEROSCOPY/STONE EXTRACTION WITH BASKET  yrs ago  . DILATION AND CURETTAGE OF UTERUS    . HOLMIUM LASER APPLICATION Right 0/16/0109   Procedure: HOLMIUM LASER APPLICATION;  Surgeon: Alexis Frock, MD;  Location: [facility];  Service: Urology;  Laterality: Right;  . KNEE SURGERY     right  . NEUROPLASTY / TRANSPOSITION ULNAR NERVE AT ELBOW Left 02-23-2000    [facility]   and  Left Carpal Tunnel Release  . TRANSTHORACIC ECHOCARDIOGRAM  02/26/2016   moderate focal basal and mild concentric LVH,  ef 55-60%,  akinesis of the basal-midanteroseptal, inferoseptal and apicalinferior myocardium, study not sufficient to evaluation diastolic funciton due to atrial fib./  trivial PR/  mild TR    Vitals:   02/19/18 1449 02/19/18 1506 02/19/18 1507  BP: (!) 163/112 (!) 150/90 (!) 160/90  Pulse: 97 80     Subjective Assessment - 02/19/18 1535    Subjective  Pt states that the ex's she's doing is causing her back pain to exacerbate; her right knee is hurting as  well; she's going to orthopedic md and plans to get a shot in it;  she stated she took her BP meds today and denies any symptom related to HTN; feels pretty good except for some back pain ; she stated her BP was 170/90 at home and her cardiologist stated that this is ok for her    Pertinent History  macular degeneration, CAD, HTN, PAF on Eliquis, CKD, excision skin cancer under right eye    Patient Stated Goals  walk without the cane; be able to walk to the mailbox    Currently in Pain?  Yes    Pain Score  6     Pain Location  Back    Pain Orientation  Lower    Pain Descriptors / Indicators  Aching    Pain Type  Chronic pain    Aggravating Factors   doing the ex's     Multiple Pain Sites  Yes    Pain Score  4    Pain Orientation  Right    Pain Descriptors / Indicators  Aching    Pain Type  Chronic pain    Pain Onset  More than a month ago    Pain Frequency  Intermittent    Aggravating Factors   single leg stance exs     Pain Relieving Factors  sitting     Effect of Pain on Daily Activities  difficult with walking              there ex;  Access Code: [email or web address]  URL: [email or web address]  Date: 02/19/2018  Prepared by: Rosaura Carpenter   Exercises  Supine Diaphragmatic Breathing with Alternating Shoulder Flexion - 10 reps - 3 sets - 1x daily - 7x weekly  Supine Active Assistive Single Arm Shoulder Protraction - 10 reps - 3 sets - 1x daily - 7x weekly  Supine Posterior Pelvic Tilt - 10 reps - 3 sets - 1x daily - 7x weekly  Supine 90/90 Alternating Heel Touches with Posterior Pelvic Tilt - 10 reps - 3 sets - 1x daily - 7x weekly  Small Range Straight Leg Raise - 10 reps - 3 sets - 1x daily - 7x weekly                    PT Education - 02/19/18 1539    Education Details  educated to use todays ex's to help with lower back pain , pelvic posture control , knee pain and incorporated relaxation and breathing with each exercise;    Person(s) Educated   Patient    Methods  Handout;Demonstration;Explanation    Comprehension  Verbalized understanding       PT Short Term Goals - 02/10/18 2014      PT SHORT TERM GOAL #1   Title  Assess gait velocity with cane with STG to be >1.81 ft/sec (indicative of lesser fall  risk) (Target all STGs 02/28/2018)    Baseline  9/9 2.08 ft/sec    Time  4    Period  Weeks    Status  Achieved      PT SHORT TERM GOAL #2   Title  Patient will improve DGI score by 3 points ([place])    Baseline  01/29/18 13/28    Time  4    Period  Weeks    Status  New      PT SHORT TERM GOAL #3   Title  Patient will be independent with HEP for balance and strengthening    Time  4    Period  Weeks    Status  New        PT Long Term Goals - 01/29/18 1648      PT LONG TERM GOAL #1   Title  independent with updated HEP (target all LTGs 03/30/2018)    Time  8    Period  Weeks    Status  New    Target Date  03/30/18      PT LONG TERM GOAL #2   Title  Patient will ambulate 200 feet with LRAD modified independent on level surfaces with head turns over 40 ft of the entire distance    Time  8    Period  Weeks    Status  New      PT LONG TERM GOAL #3   Title  improve DGI score to >=19/28 to demonstrate lesser fall risk    Time  8    Period  Weeks    Status  New      PT LONG TERM GOAL #4   Title  Patient will improve gait velocity to >=2.62 ft/sec for safe community ambulation    Time  8    Period  Weeks    Status  New            Plan - 02/19/18 1541    Clinical Impairments Affecting Rehab Potential  macular degeneration and limited cervical ROM can impact abiity to do vestibular rehab exercises; pt with bil LE sensory loss impacting ability to maintain balance    PT Frequency  2x / week    PT Duration  8 weeks    PT Treatment/Interventions  ADLs/Self Care Home Management;Gait training;DME Instruction;Functional mobility training;Therapeutic activities;Therapeutic exercise;Balance training;Neuromuscular  re-education;Manual techniques;Patient/family education;Passive range of motion;Dry needling;Vestibular;Visual/perceptual remediation/compensation    PT Next Visit Plan  follow up with BP; proceed accordingly; if the HEP helped with back and knee pain ; progess back into vestib ex's and single leg stance ex's     Consulted and Agree with Plan of Care  Patient       Patient will benefit from skilled therapeutic intervention in order to improve the following deficits and impairments:  Abnormal gait, Decreased activity tolerance, Decreased balance, Decreased mobility, Decreased knowledge of use of DME, Decreased knowledge of precautions, Decreased safety awareness, Decreased strength, Dizziness, Increased fascial restricitons, Increased muscle spasms, Impaired flexibility, Impaired sensation, Postural dysfunction, Impaired vision/preception  Visit Diagnosis: Other abnormalities of gait and mobility  Dizziness and giddiness  Unsteadiness on feet  Abnormal posture  Chronic pain of right knee  Chronic bilateral low back pain without sciatica     Problem List Patient Active Problem List   Diagnosis Date Noted  . Constipation   . Acute on chronic cholecystitis s/p lap cholecystectomy 12/06/2017 12/04/2017  . Nephrolithiasis 12/04/2017  . Abdominal pain 12/04/2017  . Spinal stenosis  in cervical region 04/24/2017  . Spinal stenosis of lumbar region 04/24/2017  . Dizziness 04/24/2017  . Ureteral stone with hydronephrosis 01/20/2017  . Atrial fibrillation with RVR ([place]) 02/24/2016  . CKD (chronic kidney disease) stage 3, GFR 30-59 ml/min (H[facility]) 02/24/2016  . Paroxysmal atrial fibrillation ([place]) 02/24/2016  . Exudative macular degeneration ([place]) 02/24/2016  . CHOLELITHIASIS 02/14/2009  . HYPERLIPIDEMIA 12/29/2008  . Essential hypertension 12/29/2008  . MYOCARDIAL INFARCTION, HX OF 12/29/2008  . CAD (coronary artery disease) 12/29/2008    Rosaura Carpenter D PT DPT  02/19/2018, 3:49  PM  [facility] [address] [place], [place], [address] Phone: [phone]   Fax:  [phone]  Name: Shelly Obrien MRN: [id number] Date of Birth: June 06, 1922

## 2018-02-19 NOTE — Patient Instructions (Signed)
Access Code: [email or web address]  URL: [email or web address]  Date: 02/19/2018  Prepared by: Rosaura Carpenter   Exercises  Supine Diaphragmatic Breathing with Alternating Shoulder Flexion - 10 reps - 3 sets - 1x daily - 7x weekly  Supine Active Assistive Single Arm Shoulder Protraction - 10 reps - 3 sets - 1x daily - 7x weekly  Supine Posterior Pelvic Tilt - 10 reps - 3 sets - 1x daily - 7x weekly  Supine 90/90 Alternating Heel Touches with Posterior Pelvic Tilt - 10 reps - 3 sets - 1x daily - 7x weekly  Small Range Straight Leg Raise - 10 reps - 3 sets - 1x daily - 7x weekly

## 2018-02-20 DIAGNOSIS — H6122 Impacted cerumen, left ear: Secondary | ICD-10-CM | POA: Diagnosis not present

## 2018-02-20 DIAGNOSIS — J320 Chronic maxillary sinusitis: Secondary | ICD-10-CM | POA: Diagnosis not present

## 2018-02-20 DIAGNOSIS — J040 Acute laryngitis: Secondary | ICD-10-CM | POA: Diagnosis not present

## 2018-02-20 DIAGNOSIS — H903 Sensorineural hearing loss, bilateral: Secondary | ICD-10-CM | POA: Diagnosis not present

## 2018-02-20 DIAGNOSIS — J322 Chronic ethmoidal sinusitis: Secondary | ICD-10-CM | POA: Diagnosis not present

## 2018-02-20 DIAGNOSIS — H8143 Vertigo of central origin, bilateral: Secondary | ICD-10-CM | POA: Diagnosis not present

## 2018-02-24 ENCOUNTER — Encounter: Payer: Self-pay | Admitting: Physical Therapy

## 2018-02-24 ENCOUNTER — Ambulatory Visit: Payer: Medicare Other | Admitting: Physical Therapy

## 2018-02-24 DIAGNOSIS — R293 Abnormal posture: Secondary | ICD-10-CM | POA: Diagnosis not present

## 2018-02-24 DIAGNOSIS — M25561 Pain in right knee: Secondary | ICD-10-CM | POA: Diagnosis not present

## 2018-02-24 DIAGNOSIS — R2689 Other abnormalities of gait and mobility: Secondary | ICD-10-CM

## 2018-02-24 DIAGNOSIS — R42 Dizziness and giddiness: Secondary | ICD-10-CM | POA: Diagnosis not present

## 2018-02-24 DIAGNOSIS — R2681 Unsteadiness on feet: Secondary | ICD-10-CM

## 2018-02-24 DIAGNOSIS — G8929 Other chronic pain: Secondary | ICD-10-CM | POA: Diagnosis not present

## 2018-02-24 NOTE — Therapy (Signed)
[facility] [address] [place], [place], [address] Phone: [phone]   Fax:  [phone]  Physical Therapy Treatment  Patient Details  Name: Shelly Obrien MRN: [id number] Date of Birth: [birth year] Referring Provider: [name], MD   Encounter Date: 02/24/2018  PT End of Session - 02/24/18 1430    Visit Number  5    Number of Visits  17    Date for PT Re-Evaluation  03/30/18    Authorization Type  Medicare A/B    Authorization Time Period  01/29/18 to 04/29/2018    PT Start Time  1015    PT Stop Time  1100    PT Time Calculation (min)  45 min    Activity Tolerance  Patient tolerated treatment well    Behavior During Therapy  [facility] for tasks assessed/performed       Past Medical History:  Diagnosis Date  . CAD S/P percutaneous coronary angioplasty    hx MI 04/ 1994  s/p  PCI to LAD  . CKD (chronic kidney disease), stage III ([place])   . Complication of anesthesia    hard to wake   . History of basal cell carcinoma (BCC) excision    right eye area 08/ 2013  . History of kidney stones 01/20/2017  . History of MI (myocardial infarction) 09/1992   s/p  PCI to LAD  . History of squamous cell carcinoma excision    right lower leg 2015 ;  2016  . Hyperlipidemia   . Hypertension   . Macular degeneration   . PAF (paroxysmal atrial fibrillation) ([place]) dx 02-24-2016   followed by cardiologist-- dr Mamie Nick. [name]  . PONV (postoperative nausea and vomiting)   . Right ureteral stone   . Wears glasses     Past Surgical History:  Procedure Laterality Date  . ABDOMINAL HYSTERECTOMY    . BREAST EXCISIONAL BIOPSY Left 11/02/2009   fibroadenoma (lumpectomy)  . BROW LIFT Right 04/07/2014   Procedure: REPAIR OF ENTROPION AND TRICHIASIS OF RIGHT LOWER EYE LID ;  Surgeon: Theodoro Kos, DO;  Location: [facility];  Service: Plastics;  Laterality: Right;  . CARDIOVERSION N/A 07/18/2017   Procedure: CARDIOVERSION;   Surgeon: Skeet Latch, MD;  Location: [facility];  Service: Cardiovascular;  Laterality: N/A;  . CATARACT EXTRACTION W/ INTRAOCULAR LENS  IMPLANT, BILATERAL    . CHOLECYSTECTOMY N/A 12/06/2017   Procedure: LAPAROSCOPIC CHOLECYSTECTOMY;  Surgeon: Jovita Kussmaul, MD;  [facility];  Service: General;  Laterality: N/A;  . CORONARY ANGIOPLASTY  04/ 1994  dr Lia Foyer   PCI to LAD  . CYSTOSCOPY W/ URETERAL STENT PLACEMENT Right 01/20/2017   Procedure: CYSTOSCOPY WITH RIGHT RETROGRADE PYELOGRAM/RIGHT URETERAL STENT PLACEMENT;  Surgeon: Alexis Frock, MD;  [facility];  Service: Urology;  Laterality: Right;  . CYSTOSCOPY WITH RETROGRADE PYELOGRAM, URETEROSCOPY AND STENT PLACEMENT Right 02/15/2017   Procedure: CYSTOSCOPY WITH RETROGRADE PYELOGRAM, URETEROSCOPY, STONE BASKETRY AND STENT REPLACEMENT;  Surgeon: Alexis Frock, MD;  Location: [facility];  Service: Urology;  Laterality: Right;  . CYSTOSCOPY/RETROGRADE/URETEROSCOPY/STONE EXTRACTION WITH BASKET  yrs ago  . DILATION AND CURETTAGE OF UTERUS    . HOLMIUM LASER APPLICATION Right 1/93/7902   Procedure: HOLMIUM LASER APPLICATION;  Surgeon: Alexis Frock, MD;  Location: [facility];  Service: Urology;  Laterality: Right;  . KNEE SURGERY     right  . NEUROPLASTY / TRANSPOSITION ULNAR NERVE AT ELBOW Left 02-23-2000    [facility]   and  Left Carpal Tunnel Release  . TRANSTHORACIC ECHOCARDIOGRAM  02/26/2016   moderate focal basal and mild concentric LVH,  ef 55-60%,  akinesis of the basal-midanteroseptal, inferoseptal and apicalinferior myocardium, study not sufficient to evaluation diastolic funciton due to atrial fib./  trivial PR/  mild TR    There were no vitals filed for this visit.  Subjective Assessment - 02/24/18 1019    Subjective  The last fellow gave me exercises for my back (I made the mistake of telling him my back was hurting--I told him I was using muscles I hadn't used in a while).     Pertinent  History  macular degeneration, CAD, HTN, PAF on Eliquis, CKD, excision skin cancer under right eye    Patient Stated Goals  walk without the cane; be able to walk to the mailbox    Currently in Pain?  No/denies    Pain Onset  More than a month ago                            Balance Exercises - 02/24/18 1436      Balance Exercises: Standing   Standing Eyes Closed  Narrow base of support (BOS);Wide (BOA);Head turns;Solid surface   revised HEP   Wall Bumps  Hip    Wall Bumps-Hips  Eyes opened;Eyes closed;Anterior/posterior;10 reps    Stepping Strategy  Posterior;UE support;10 reps    Tandem Gait  Forward;Upper extremity support;3 reps    Retro Gait  Upper extremity support;3 reps    Sidestepping  4 reps          PT Short Term Goals - 02/10/18 2014      PT SHORT TERM GOAL #1   Title  Assess gait velocity with cane with STG to be >1.81 ft/sec (indicative of lesser fall risk) (Target all STGs 02/28/2018)    Baseline  9/9 2.08 ft/sec    Time  4    Period  Weeks    Status  Achieved      PT SHORT TERM GOAL #2   Title  Patient will improve DGI score by 3 points ([place])    Baseline  01/29/18 13/28    Time  4    Period  Weeks    Status  New      PT SHORT TERM GOAL #3   Title  Patient will be independent with HEP for balance and strengthening    Time  4    Period  Weeks    Status  New        PT Long Term Goals - 01/29/18 1648      PT LONG TERM GOAL #1   Title  independent with updated HEP (target all LTGs 03/30/2018)    Time  8    Period  Weeks    Status  New    Target Date  03/30/18      PT LONG TERM GOAL #2   Title  Patient will ambulate 200 feet with LRAD modified independent on level surfaces with head turns over 40 ft of the entire distance    Time  8    Period  Weeks    Status  New      PT LONG TERM GOAL #3   Title  improve DGI score to >=19/28 to demonstrate lesser fall risk    Time  8    Period  Weeks    Status  New      PT  LONG TERM  GOAL #4   Title  Patient will improve gait velocity to >=2.62 ft/sec for safe community ambulation    Time  8    Period  Weeks    Status  New            Plan - 02/24/18 1431    Clinical Impression Statement  Patient reported significant difficulty with corner exercises with EC therefore reviewed in clinic. Changed from feet together to feet apart to allow pt to safely perform at home and be able to progress. Added additional dynamic balance activities to HEP. Patient reports she doesn't feel any dizziness, just imbalance. Continue to work towards [place].Will measure STGs next visit.     Rehab Potential  Fair    Clinical Impairments Affecting Rehab Potential  macular degeneration and limited cervical ROM can impact abiity to do vestibular rehab exercises; pt with bil LE sensory loss impacting ability to maintain balance    PT Frequency  2x / week    PT Duration  8 weeks    PT Treatment/Interventions  ADLs/Self Care Home Management;Gait training;DME Instruction;Functional mobility training;Therapeutic activities;Therapeutic exercise;Balance training;Neuromuscular re-education;Manual techniques;Patient/family education;Passive range of motion;Dry needling;Vestibular;Visual/perceptual remediation/compensation    PT Next Visit Plan  check STGs; dynamic balance activities, work on compliant surfaces and EC exercises.    PT Home Exercise Plan  Access Code: [email or web address]    Consulted and Agree with Plan of Care  Patient       Patient will benefit from skilled therapeutic intervention in order to improve the following deficits and impairments:  Abnormal gait, Decreased activity tolerance, Decreased balance, Decreased mobility, Decreased knowledge of use of DME, Decreased knowledge of precautions, Decreased safety awareness, Decreased strength, Dizziness, Increased fascial restricitons, Increased muscle spasms, Impaired flexibility, Impaired sensation, Postural dysfunction, Impaired  vision/preception  Visit Diagnosis: Other abnormalities of gait and mobility  Unsteadiness on feet     Problem List Patient Active Problem List   Diagnosis Date Noted  . Constipation   . Acute on chronic cholecystitis s/p lap cholecystectomy 12/06/2017 12/04/2017  . Nephrolithiasis 12/04/2017  . Abdominal pain 12/04/2017  . Spinal stenosis in cervical region 04/24/2017  . Spinal stenosis of lumbar region 04/24/2017  . Dizziness 04/24/2017  . Ureteral stone with hydronephrosis 01/20/2017  . Atrial fibrillation with RVR ([place]) 02/24/2016  . CKD (chronic kidney disease) stage 3, GFR 30-59 ml/min ([facility]) 02/24/2016  . Paroxysmal atrial fibrillation ([place]) 02/24/2016  . Exudative macular degeneration ([place]) 02/24/2016  . CHOLELITHIASIS 02/14/2009  . HYPERLIPIDEMIA 12/29/2008  . Essential hypertension 12/29/2008  . MYOCARDIAL INFARCTION, HX OF 12/29/2008  . CAD (coronary artery disease) 12/29/2008    Rexanne Mano, PT 02/24/2018, 2:37 PM  [facility] [address] [place], [place], [address] Phone: [phone]   Fax:  [phone]  Name: Shelly Obrien MRN: [id number] Date of Birth: 1922/06/15

## 2018-02-24 NOTE — Patient Instructions (Signed)
Access Code: [email or web address]  URL: [email or web address]  Date: 02/24/2018  Prepared by: Barry Brunner   Exercises  Standing Marching - 20 reps - 1 sets - 1x daily - 5x weekly  Standing Hip Abduction with Counter Support - 20 reps - 1 sets - 3 seconds hold - 1x daily - 5x weekly  Standing Single Leg Stance with Unilateral Counter Support - 3 reps - 1 sets - 10 hold - 1x daily - 5x weekly   UPDATED Ex's below Wide Stance with Eyes Closed - 1 sets - 1x daily - 5x weekly  Backward Walking with Counter Support - 10 reps - 1 sets - 1x daily - 5x weekly

## 2018-02-26 DIAGNOSIS — J322 Chronic ethmoidal sinusitis: Secondary | ICD-10-CM | POA: Diagnosis not present

## 2018-02-26 DIAGNOSIS — J320 Chronic maxillary sinusitis: Secondary | ICD-10-CM | POA: Diagnosis not present

## 2018-02-27 ENCOUNTER — Encounter: Payer: Self-pay | Admitting: Physical Therapy

## 2018-02-27 ENCOUNTER — Ambulatory Visit: Payer: Medicare Other | Admitting: Physical Therapy

## 2018-02-27 DIAGNOSIS — G8929 Other chronic pain: Secondary | ICD-10-CM | POA: Diagnosis not present

## 2018-02-27 DIAGNOSIS — R2689 Other abnormalities of gait and mobility: Secondary | ICD-10-CM

## 2018-02-27 DIAGNOSIS — R2681 Unsteadiness on feet: Secondary | ICD-10-CM | POA: Diagnosis not present

## 2018-02-27 DIAGNOSIS — R293 Abnormal posture: Secondary | ICD-10-CM

## 2018-02-27 DIAGNOSIS — R42 Dizziness and giddiness: Secondary | ICD-10-CM

## 2018-02-27 DIAGNOSIS — M25561 Pain in right knee: Secondary | ICD-10-CM | POA: Diagnosis not present

## 2018-02-27 NOTE — Therapy (Signed)
[facility] [address] [place], [place], [address] Phone: [phone]   Fax:  [phone]  Physical Therapy Treatment  Patient Details  Name: Shelly Obrien MRN: [id number] Date of Birth: 09/01/1922 Referring Provider (PT): [name], MD   Encounter Date: 02/27/2018  PT End of Session - 02/27/18 1520    Visit Number  6    Number of Visits  17    Date for PT Re-Evaluation  03/30/18    Authorization Type  Medicare A/B    Authorization Time Period  01/29/18 to 04/29/2018    PT Start Time  1318    PT Stop Time  1405    PT Time Calculation (min)  47 min    Activity Tolerance  Patient tolerated treatment well    Behavior During Therapy  [facility] for tasks assessed/performed       Past Medical History:  Diagnosis Date  . CAD S/P percutaneous coronary angioplasty    hx MI 04/ 1994  s/p  PCI to LAD  . CKD (chronic kidney disease), stage III ([place])   . Complication of anesthesia    hard to wake   . History of basal cell carcinoma (BCC) excision    right eye area 08/ 2013  . History of kidney stones 01/20/2017  . History of MI (myocardial infarction) 09/1992   s/p  PCI to LAD  . History of squamous cell carcinoma excision    right lower leg 2015 ;  2016  . Hyperlipidemia   . Hypertension   . Macular degeneration   . PAF (paroxysmal atrial fibrillation) ([place]) dx 02-24-2016   followed by cardiologist-- dr Mamie Nick. [name]  . PONV (postoperative nausea and vomiting)   . Right ureteral stone   . Wears glasses     Past Surgical History:  Procedure Laterality Date  . ABDOMINAL HYSTERECTOMY    . BREAST EXCISIONAL BIOPSY Left 11/02/2009   fibroadenoma (lumpectomy)  . BROW LIFT Right 04/07/2014   Procedure: REPAIR OF ENTROPION AND TRICHIASIS OF RIGHT LOWER EYE LID ;  Surgeon: Theodoro Kos, DO;  Location: [place];  Service: Plastics;  Laterality: Right;  . CARDIOVERSION N/A 07/18/2017   Procedure: CARDIOVERSION;   Surgeon: Skeet Latch, MD;  Location: [facility];  Service: Cardiovascular;  Laterality: N/A;  . CATARACT EXTRACTION W/ INTRAOCULAR LENS  IMPLANT, BILATERAL    . CHOLECYSTECTOMY N/A 12/06/2017   Procedure: LAPAROSCOPIC CHOLECYSTECTOMY;  Surgeon: Jovita Kussmaul, MD;  Location: [facility];  Service: General;  Laterality: N/A;  . CORONARY ANGIOPLASTY  04/ 1994  dr Lia Foyer   PCI to LAD  . CYSTOSCOPY W/ URETERAL STENT PLACEMENT Right 01/20/2017   Procedure: CYSTOSCOPY WITH RIGHT RETROGRADE PYELOGRAM/RIGHT URETERAL STENT PLACEMENT;  Surgeon: Alexis Frock, MD;  [facility];  Service: Urology;  Laterality: Right;  . CYSTOSCOPY WITH RETROGRADE PYELOGRAM, URETEROSCOPY AND STENT PLACEMENT Right 02/15/2017   Procedure: CYSTOSCOPY WITH RETROGRADE PYELOGRAM, URETEROSCOPY, STONE BASKETRY AND STENT REPLACEMENT;  Surgeon: Alexis Frock, MD;  Location: [facility];  Service: Urology;  Laterality: Right;  . CYSTOSCOPY/RETROGRADE/URETEROSCOPY/STONE EXTRACTION WITH BASKET  yrs ago  . DILATION AND CURETTAGE OF UTERUS    . HOLMIUM LASER APPLICATION Right 01/29/33   Procedure: HOLMIUM LASER APPLICATION;  Surgeon: Alexis Frock, MD;  Location: [facility];  Service: Urology;  Laterality: Right;  . KNEE SURGERY     right  . NEUROPLASTY / TRANSPOSITION ULNAR NERVE AT ELBOW Left 02-23-2000    [facility]  and Left Carpal Tunnel Release  . TRANSTHORACIC ECHOCARDIOGRAM  02/26/2016   moderate focal basal and mild concentric LVH,  ef 55-60%,  akinesis of the basal-midanteroseptal, inferoseptal and apicalinferior myocardium, study not sufficient to evaluation diastolic funciton due to atrial fib./  trivial PR/  mild TR    There were no vitals filed for this visit.  Subjective Assessment - 02/27/18 1324    Subjective  No pain today but does feel some discomfort when performing letter and corner exercises.  Feeling a little foggy today - pt asking a lot of questions about how vision and  neck can affect her balance.    Pertinent History  macular degeneration, CAD, HTN, PAF on Eliquis, CKD, excision skin cancer under right eye    Patient Stated Goals  walk without the cane; be able to walk to the mailbox    Currently in Pain?  No/denies    Pain Onset  More than a month ago         [facility] PT Assessment - 02/27/18 1330      Standardized Balance Assessment   Standardized Balance Assessment  Dynamic Gait Index      Dynamic Gait Index   Level Surface  Mild Impairment    Change in Gait Speed  Moderate Impairment    Gait with Horizontal Head Turns  Mild Impairment    Gait with Vertical Head Turns  Mild Impairment    Gait and Pivot Turn  Mild Impairment    Step Over Obstacle  Normal    Step Around Obstacles  Normal    Steps  Mild Impairment    Total Score  17    DGI comment:  score of 17/24 predictive of falls in older population                    Vestibular Treatment/Exercise - 02/27/18 1516      Vestibular Treatment/Exercise   Vestibular Treatment Provided  Gaze    Gaze Exercises  X1 Viewing Horizontal;X1 Viewing Vertical      X1 Viewing Horizontal   Foot Position  began in sitting and progressed to standing with feet apart    Reps  2    Comments  continued to reinforce smaller ROM and increasing speed; 30 seconds      X1 Viewing Vertical   Foot Position  began in sitting and progressed to standing with feet apart    Reps  2    Comments  30 seconds         Balance Exercises - 02/27/18 1516      Balance Exercises: Standing   Standing Eyes Opened  Wide (BOA);Head turns;Solid surface;Other reps (comment)   10 reps head turns/nods   Standing Eyes Closed  Narrow base of support (BOS);Solid surface;10 secs   no head turns    Access Code: [email or web address]  URL: [email or web address]  Date: 02/27/2018  Prepared by: Misty Stanley   Exercises  Standing Marching - 20 reps - 1 sets - 1x daily - 5x weekly  Standing Hip Abduction with  Counter Support - 20 reps - 1 sets - 3 seconds hold - 1x daily - 5x weekly  Standing Single Leg Stance with Unilateral Counter Support - 3 reps - 1 sets - 10 hold - 1x daily - 5x weekly  Backward Walking with Counter Support - 10 reps - 1 sets - 1x daily - 5x weekly  Standing Gaze Stabilization with Head Rotation - 2 reps - 30 seconds hold -  1x daily - 5x weekly  Romberg Stance with Head Rotation - 10 reps - 2 sets - 1x daily - 5x weekly  Romberg Stance with Head Nods - 10 reps - 2 sets - 1x daily - 5x weekly  Romberg Stance with Eyes Closed - 2 sets - 10 second hold - 1x daily - 5x weekly     PT Education - 02/27/18 1403    Education Details  updated HEP, progress towards goals     Person(s) Educated  Patient    Methods  Explanation;Demonstration;Handout    Comprehension  Verbalized understanding;Returned demonstration       PT Short Term Goals - 02/27/18 1519      PT SHORT TERM GOAL #1   Title  Assess gait velocity with cane with STG to be >1.81 ft/sec (indicative of lesser fall risk) (Target all STGs 02/28/2018)    Baseline  9/9 2.08 ft/sec    Time  4    Period  Weeks    Status  Achieved      PT SHORT TERM GOAL #2   Title  Patient will improve DGI score by 3 points ([place])    Baseline  17/24    Time  4    Period  Weeks    Status  Achieved      PT SHORT TERM GOAL #3   Title  Patient will be independent with HEP for balance and strengthening    Baseline  still needs cues to perform x 1 viewing correctly    Time  4    Period  Weeks    Status  Partially Met        PT Long Term Goals - 01/29/18 1648      PT LONG TERM GOAL #1   Title  independent with updated HEP (target all LTGs 03/30/2018)    Time  8    Period  Weeks    Status  New    Target Date  03/30/18      PT LONG TERM GOAL #2   Title  Patient will ambulate 200 feet with LRAD modified independent on level surfaces with head turns over 40 ft of the entire distance    Time  8    Period  Weeks    Status  New       PT LONG TERM GOAL #3   Title  improve DGI score to >=19/28 to demonstrate lesser fall risk    Time  8    Period  Weeks    Status  New      PT LONG TERM GOAL #4   Title  Patient will improve gait velocity to >=2.62 ft/sec for safe community ambulation    Time  8    Period  Weeks    Status  New            Plan - 02/27/18 1521    Clinical Impression Statement  Continued assessment of progress towards STG with re-assessment of balance and safety with gait.  Pt demonstrates 4 point improvement in DGI with significant improvement in balance during head turns with gait.  Continued to review and progress standing balance and vestibular HEP and provide pt education regarding impact of visual impairments and limitations in cervical ROM can have on balance.  Will continue to address and progress towards LTG.    Rehab Potential  Fair    Clinical Impairments Affecting Rehab Potential  macular degeneration and limited cervical ROM can impact abiity to do  vestibular rehab exercises; pt with bil LE sensory loss impacting ability to maintain balance    PT Frequency  2x / week    PT Duration  8 weeks    PT Treatment/Interventions  ADLs/Self Care Home Management;Gait training;DME Instruction;Functional mobility training;Therapeutic activities;Therapeutic exercise;Balance training;Neuromuscular re-education;Manual techniques;Patient/family education;Passive range of motion;Dry needling;Vestibular;Visual/perceptual remediation/compensation    PT Next Visit Plan  continue to progress HEP, neck stretches; gait with head turns/retro/turns    PT Home Exercise Plan  Access Code: [email or web address]    Consulted and Agree with Plan of Care  Patient       Patient will benefit from skilled therapeutic intervention in order to improve the following deficits and impairments:  Abnormal gait, Decreased activity tolerance, Decreased balance, Decreased mobility, Decreased knowledge of use of DME, Decreased knowledge of  precautions, Decreased safety awareness, Decreased strength, Dizziness, Increased fascial restricitons, Increased muscle spasms, Impaired flexibility, Impaired sensation, Postural dysfunction, Impaired vision/preception  Visit Diagnosis: Unsteadiness on feet  Other abnormalities of gait and mobility  Dizziness and giddiness  Abnormal posture     Problem List Patient Active Problem List   Diagnosis Date Noted  . Constipation   . Acute on chronic cholecystitis s/p lap cholecystectomy 12/06/2017 12/04/2017  . Nephrolithiasis 12/04/2017  . Abdominal pain 12/04/2017  . Spinal stenosis in cervical region 04/24/2017  . Spinal stenosis of lumbar region 04/24/2017  . Dizziness 04/24/2017  . Ureteral stone with hydronephrosis 01/20/2017  . Atrial fibrillation with RVR ([place]) 02/24/2016  . CKD (chronic kidney disease) stage 3, GFR 30-59 ml/min (HCC) 02/24/2016  . Paroxysmal atrial fibrillation ([place]) 02/24/2016  . Exudative macular degeneration ([place]) 02/24/2016  . CHOLELITHIASIS 02/14/2009  . HYPERLIPIDEMIA 12/29/2008  . Essential hypertension 12/29/2008  . MYOCARDIAL INFARCTION, HX OF 12/29/2008  . CAD (coronary artery disease) 12/29/2008    Rico Junker, PT, DPT 02/27/18    3:24 PM    [facility] [address] [place], [place], [address] Phone: [phone]   Fax:  [phone]  Name: TAJUANA KNISKERN MRN: [id number] Date of Birth: April 27, 1923

## 2018-02-27 NOTE — Patient Instructions (Addendum)
Access Code: [email or web address]  URL: [email or web address]  Date: 02/27/2018  Prepared by: Misty Stanley   Exercises  Standing Marching - 20 reps - 1 sets - 1x daily - 5x weekly  Standing Hip Abduction with Counter Support - 20 reps - 1 sets - 3 seconds hold - 1x daily - 5x weekly  Standing Single Leg Stance with Unilateral Counter Support - 3 reps - 1 sets - 10 hold - 1x daily - 5x weekly  Backward Walking with Counter Support - 10 reps - 1 sets - 1x daily - 5x weekly  Standing Gaze Stabilization with Head Rotation - 2 reps - 30 seconds hold - 1x daily - 5x weekly  Romberg Stance with Head Rotation - 10 reps - 2 sets - 1x daily - 5x weekly  Romberg Stance with Head Nods - 10 reps - 2 sets - 1x daily - 5x weekly  Romberg Stance with Eyes Closed - 2 sets - 10 second hold - 1x daily - 5x weekly

## 2018-03-03 ENCOUNTER — Encounter: Payer: Self-pay | Admitting: Physical Therapy

## 2018-03-03 ENCOUNTER — Ambulatory Visit: Payer: Medicare Other | Admitting: Physical Therapy

## 2018-03-03 DIAGNOSIS — M1711 Unilateral primary osteoarthritis, right knee: Secondary | ICD-10-CM | POA: Diagnosis not present

## 2018-03-03 DIAGNOSIS — G8929 Other chronic pain: Secondary | ICD-10-CM | POA: Diagnosis not present

## 2018-03-03 DIAGNOSIS — R2689 Other abnormalities of gait and mobility: Secondary | ICD-10-CM | POA: Diagnosis not present

## 2018-03-03 DIAGNOSIS — R42 Dizziness and giddiness: Secondary | ICD-10-CM | POA: Diagnosis not present

## 2018-03-03 DIAGNOSIS — R2681 Unsteadiness on feet: Secondary | ICD-10-CM

## 2018-03-03 DIAGNOSIS — R293 Abnormal posture: Secondary | ICD-10-CM | POA: Diagnosis not present

## 2018-03-03 DIAGNOSIS — M25561 Pain in right knee: Secondary | ICD-10-CM | POA: Diagnosis not present

## 2018-03-03 NOTE — Patient Instructions (Signed)
Neck Stretch (Chair) - Variation 1    One hand holding edge of chair, roll same side shoulder back to open chest. Tip head to other side, chin slightly tucked. Optional: Place other hand above temple for sensitive pressure to deepen stretch. Hold for 15-20 secs. Repeat _2___ times each side. CAN place other arm (that is not gently pulling head down toward shoulder) out to side for more intense stretch.    Upper Cervical Rotation    Rotate head slowly from side to side as if saying "no". Do not turn head completely to either side. Keep motion small. Repeat __10__ times per set. Do __1__ sets per session. Do __1__ sessions per day.  [email or web address].[email or web address]

## 2018-03-03 NOTE — Therapy (Signed)
[facility] [address] [place], [place], [address] Phone: [phone]   Fax:  [phone]  Physical Therapy Treatment  Patient Details  Name: Shelly Obrien MRN: [id number] Date of Birth: 22-Aug-1922 Referring Provider (PT): [name], MD   Encounter Date: 03/03/2018  PT End of Session - 03/03/18 1419    Visit Number  7    Number of Visits  17    Date for PT Re-Evaluation  03/30/18    Authorization Type  Medicare A/B    Authorization Time Period  01/29/18 to 04/29/2018    PT Start Time  1315    PT Stop Time  1401    PT Time Calculation (min)  46 min       Past Medical History:  Diagnosis Date  . CAD S/P percutaneous coronary angioplasty    hx MI 04/ 1994  s/p  PCI to LAD  . CKD (chronic kidney disease), stage III ([place])   . Complication of anesthesia    hard to wake   . History of basal cell carcinoma (BCC) excision    right eye area 08/ 2013  . History of kidney stones 01/20/2017  . History of MI (myocardial infarction) 09/1992   s/p  PCI to LAD  . History of squamous cell carcinoma excision    right lower leg 2015 ;  2016  . Hyperlipidemia   . Hypertension   . Macular degeneration   . PAF (paroxysmal atrial fibrillation) ([place]) dx 02-24-2016   followed by cardiologist-- dr Mamie Nick. [name]  . PONV (postoperative nausea and vomiting)   . Right ureteral stone   . Wears glasses     Past Surgical History:  Procedure Laterality Date  . ABDOMINAL HYSTERECTOMY    . BREAST EXCISIONAL BIOPSY Left 11/02/2009   fibroadenoma (lumpectomy)  . BROW LIFT Right 04/07/2014   Procedure: REPAIR OF ENTROPION AND TRICHIASIS OF RIGHT LOWER EYE LID ;  Surgeon: Theodoro Kos, DO;  Location: [facility];  Service: Plastics;  Laterality: Right;  . CARDIOVERSION N/A 07/18/2017   Procedure: CARDIOVERSION;  Surgeon: Skeet Latch, MD;  Location: [facility];  Service: Cardiovascular;  Laterality: N/A;  . CATARACT  EXTRACTION W/ INTRAOCULAR LENS  IMPLANT, BILATERAL    . CHOLECYSTECTOMY N/A 12/06/2017   Procedure: LAPAROSCOPIC CHOLECYSTECTOMY;  Surgeon: Jovita Kussmaul, MD;  [facility];  Service: General;  Laterality: N/A;  . CORONARY ANGIOPLASTY  04/ 1994  dr Lia Foyer   PCI to LAD  . CYSTOSCOPY W/ URETERAL STENT PLACEMENT Right 01/20/2017   Procedure: CYSTOSCOPY WITH RIGHT RETROGRADE PYELOGRAM/RIGHT URETERAL STENT PLACEMENT;  Surgeon: Alexis Frock, MD;  [facility];  Service: Urology;  Laterality: Right;  . CYSTOSCOPY WITH RETROGRADE PYELOGRAM, URETEROSCOPY AND STENT PLACEMENT Right 02/15/2017   Procedure: CYSTOSCOPY WITH RETROGRADE PYELOGRAM, URETEROSCOPY, STONE BASKETRY AND STENT REPLACEMENT;  Surgeon: Alexis Frock, MD;  Location: [facility];  Service: Urology;  Laterality: Right;  . CYSTOSCOPY/RETROGRADE/URETEROSCOPY/STONE EXTRACTION WITH BASKET  yrs ago  . DILATION AND CURETTAGE OF UTERUS    . HOLMIUM LASER APPLICATION Right 7/34/0370   Procedure: HOLMIUM LASER APPLICATION;  Surgeon: Alexis Frock, MD;  Location: [facility];  Service: Urology;  Laterality: Right;  . KNEE SURGERY     right  . NEUROPLASTY / TRANSPOSITION ULNAR NERVE AT ELBOW Left 02-23-2000    [facility]   and Left Carpal Tunnel Release  . TRANSTHORACIC ECHOCARDIOGRAM  02/26/2016   moderate focal basal and mild concentric LVH,  ef 55-60%,  akinesis of the basal-midanteroseptal, inferoseptal and apicalinferior myocardium, study not sufficient to evaluation diastolic funciton due to atrial fib./  trivial PR/  mild TR    There were no vitals filed for this visit.  Subjective Assessment - 03/03/18 1412    Subjective  Pt states she has not done the standing balance exercises because her right leg (knee) is swollen - is going to the orthopedic doctor after today's PT appt;  states he may give an injection in her knee; pt also reports intermittent shooting pains down left arm due to "nerve in my neck"      Pertinent History  macular degeneration, CAD, HTN, PAF on Eliquis, CKD, excision skin cancer under right eye    Patient Stated Goals  walk without the cane; be able to walk to the mailbox    Currently in Pain?  No/denies                 TherEx:  Pt performed cervical stretches - lateral cervical flexion - with overpressure applied by ipsilateral UE with other arm outstretched  To provide greater stretch - 15 sec hold x 2 reps to Rt and Lt sides Cervical rotation - cues to keep in pain free range as pt reports pain at end range with Lt cervical rotation; pt performed 5 reps cervical rotation to each side - cues to perform Rotation slowly (added these 2 stretches to HEP)      [facility] Adult PT Treatment/Exercise - 03/03/18 1325      Ambulation/Gait   Ambulation/Gait  Yes    Ambulation/Gait Assistance  4: Min guard    Ambulation/Gait Assistance Details  amb. tossing ball -40' x 2 reps then c/o fatigue in legs    Ambulation Distance (Feet)  100 Feet    Assistive device  None   SPC was brought to PT but pt did not use in session   Gait Pattern  Step-through pattern    Gait Comments  pt amb. 40' tossing ball for multi-tasking with gait           Balance Exercises - 03/03/18 1416      Balance Exercises: Standing   Standing Eyes Opened  Wide (BOA);Head turns;Solid surface;Other reps (comment)   10 reps head turns/nods   Standing Eyes Closed  Wide (BOA);Head turns;Foam/compliant surface;5 reps   horizontal & vertical head turns - in corner    Other Standing Exercises  Pt performed standing on blue foam in corner - unfolded for less compliant surface - pt performed alternate stepping out/in 5 reps and then stepping up/back 5 reps each leg with min assist for recovery of LOB - CGA with bil. UE support on PT's forearms        Pt performed trunk rotation standing on 1 pillow in corner - 5 reps to each side with touching hand to opposite wall Standing on blue foam (unfolded for  less compliancy) - marching in place with bil. UE support on PT's arms 5 reps each leg Alternate tap downs to floor - with min hand held assist 10 reps each leg   Rockerboard - anterior/posterior and laterally with decreasing UE support - performed inside // bars 10 reps each; holding board static as able Performing horizontal head turns 5 reps each with min guard assist with LOB  Pt performed alternate tap ups to 4" step without UE support - 10 reps each foot with CGA  Stepping over and back of blue balance beam for improved  SLS - 5 reps each leg without UE support (performed inside // bars)   PT Education - 03/03/18 1419    Education Details  cervical stretches - lateral cervical stretch and cervical rotation added to HEP    Person(s) Educated  Patient    Methods  Explanation;Demonstration;Handout    Comprehension  Verbalized understanding;Returned demonstration       PT Short Term Goals - 02/27/18 1519      PT SHORT TERM GOAL #1   Title  Assess gait velocity with cane with STG to be >1.81 ft/sec (indicative of lesser fall risk) (Target all STGs 02/28/2018)    Baseline  9/9 2.08 ft/sec    Time  4    Period  Weeks    Status  Achieved      PT SHORT TERM GOAL #2   Title  Patient will improve DGI score by 3 points ([place])    Baseline  17/24    Time  4    Period  Weeks    Status  Achieved      PT SHORT TERM GOAL #3   Title  Patient will be independent with HEP for balance and strengthening    Baseline  still needs cues to perform x 1 viewing correctly    Time  4    Period  Weeks    Status  Partially Met        PT Long Term Goals - 01/29/18 1648      PT LONG TERM GOAL #1   Title  independent with updated HEP (target all LTGs 03/30/2018)    Time  8    Period  Weeks    Status  New    Target Date  03/30/18      PT LONG TERM GOAL #2   Title  Patient will ambulate 200 feet with LRAD modified independent on level surfaces with head turns over 40 ft of the entire distance     Time  8    Period  Weeks    Status  New      PT LONG TERM GOAL #3   Title  improve DGI score to >=19/28 to demonstrate lesser fall risk    Time  8    Period  Weeks    Status  New      PT LONG TERM GOAL #4   Title  Patient will improve gait velocity to >=2.62 ft/sec for safe community ambulation    Time  8    Period  Weeks    Status  New            Plan - 03/03/18 1420    Clinical Impression Statement  Pt demonstrates decreased vestibular input in maintaining balance on compliant surface with EC - had moderate LOB with corner balance exercises, requiring mod assist for recovery.  Pt reported dizziness increased with performing turning (trunk rotation) in corner with EO but dizziness quickly settled after approx. 15 sec rest break.  Pt reporting that Rt knee discomfort and edema limiting standing tolerance today - going to orthopedic MD after PT today.        Clinical Impairments Affecting Rehab Potential  macular degeneration and limited cervical ROM can impact abiity to do vestibular rehab exercises; pt with bil LE sensory loss impacting ability to maintain balance    PT Frequency  2x / week    PT Duration  8 weeks    PT Treatment/Interventions  ADLs/Self Care Home Management;Gait training;DME Instruction;Functional mobility training;Therapeutic  activities;Therapeutic exercise;Balance training;Neuromuscular re-education;Manual techniques;Patient/family education;Passive range of motion;Dry needling;Vestibular;Visual/perceptual remediation/compensation    PT Next Visit Plan  check the 2 neck stretches added to HEP on 03-03-18: cont dynamic gait and balance activities    PT Home Exercise Plan  Access Code: [email or web address]; added cervical rotation and lateral flexor stretches on 03-03-18    Consulted and Agree with Plan of Care  Patient       Patient will benefit from skilled therapeutic intervention in order to improve the following deficits and impairments:  Abnormal gait, Decreased activity  tolerance, Decreased balance, Decreased mobility, Decreased knowledge of use of DME, Decreased knowledge of precautions, Decreased safety awareness, Decreased strength, Dizziness, Increased fascial restricitons, Increased muscle spasms, Impaired flexibility, Impaired sensation, Postural dysfunction, Impaired vision/preception  Visit Diagnosis: Unsteadiness on feet  Other abnormalities of gait and mobility     Problem List Patient Active Problem List   Diagnosis Date Noted  . Constipation   . Acute on chronic cholecystitis s/p lap cholecystectomy 12/06/2017 12/04/2017  . Nephrolithiasis 12/04/2017  . Abdominal pain 12/04/2017  . Spinal stenosis in cervical region 04/24/2017  . Spinal stenosis of lumbar region 04/24/2017  . Dizziness 04/24/2017  . Ureteral stone with hydronephrosis 01/20/2017  . Atrial fibrillation with RVR ([place]) 02/24/2016  . CKD (chronic kidney disease) stage 3, GFR 30-59 ml/min ([facility]CC) 02/24/2016  . Paroxysmal atrial fibrillation ([place]) 02/24/2016  . Exudative macular degeneration ([place]) 02/24/2016  . CHOLELITHIASIS 02/14/2009  . HYPERLIPIDEMIA 12/29/2008  . Essential hypertension 12/29/2008  . MYOCARDIAL INFARCTION, HX OF 12/29/2008  . CAD (coronary artery disease) 12/29/2008    Donnalyn Juran, [place], PT 03/03/2018, 2:29 PM  [facility] [address] [place], [place], [address] Phone: [phone]   Fax:  [phone]  Name: MELINA MOSTELLER MRN: [id number] Date of Birth: September 13, 1922

## 2018-03-05 ENCOUNTER — Encounter: Payer: Self-pay | Admitting: Physical Therapy

## 2018-03-05 ENCOUNTER — Ambulatory Visit: Payer: Medicare Other | Attending: Internal Medicine | Admitting: Physical Therapy

## 2018-03-05 DIAGNOSIS — R2689 Other abnormalities of gait and mobility: Secondary | ICD-10-CM | POA: Diagnosis not present

## 2018-03-05 DIAGNOSIS — R42 Dizziness and giddiness: Secondary | ICD-10-CM | POA: Insufficient documentation

## 2018-03-05 DIAGNOSIS — R293 Abnormal posture: Secondary | ICD-10-CM | POA: Diagnosis not present

## 2018-03-05 DIAGNOSIS — R2681 Unsteadiness on feet: Secondary | ICD-10-CM | POA: Diagnosis not present

## 2018-03-05 NOTE — Patient Instructions (Signed)
Access Code: [email or web address]  URL: [email or web address]  Date: 03/05/2018  Prepared by: Misty Stanley   Exercises  Standing Single Leg Stance with Unilateral Counter Support - 3 reps - 1 sets - 10 hold - 1x daily - 5x weekly  Standing Gaze Stabilization with Head Rotation - 2 reps - 60 seconds hold - 1x daily - 5x weekly  Romberg Stance with Head Rotation - 10 reps - 2 sets - 1x daily - 5x weekly  Romberg Stance with Head Nods - 10 reps - 2 sets - 1x daily - 5x weekly  Romberg Stance with Eyes Closed - 2 sets - 10 second hold - 1x daily - 5x weekly  Seated Isometric Cervical Rotation - 10 reps - 1 sets - 6 second hold - 1x daily - 7x weekly  Neck Rotation - 10 reps - 2 sets - 1x daily - 7x weekly  Seated Cervical Sidebending Stretch - 2 reps - 30 seconds hold - 1x daily - 7x weekly  Standing March with Counter Support - 10 reps - 3 sets - 1x daily - 7x weekly  Supine Cervical Retraction with Towel - 10 reps - 2 sets - 1x daily - 7x weekly  Standing Hip Abduction with Counter Support - 10 reps - 3 sets - 1x daily - 7x weekly  Supine Scapular Retraction - 10 reps - 2 sets - 1x daily - 7x weekly  Backward Walking with Counter Support - 10 reps - 3 sets - 1x daily - 7x weekly

## 2018-03-05 NOTE — Therapy (Addendum)
[employer] [address] [place], [place], [address] Phone: [phone]   Fax:  [phone]  Physical Therapy Treatment  Patient Details  Name: Shelly Obrien MRN: [id number] Date of Birth: [birth year] Referring Provider (PT): [name], MD   Encounter Date: 03/05/2018  PT End of Session - 03/05/18 1418    Visit Number  8   Number of Visits  17    Date for PT Re-Evaluation  03/30/18    Authorization Type  Medicare A/B    Authorization Time Period  01/29/18 to 04/29/2018    PT Start Time  1317    PT Stop Time  1400    PT Time Calculation (min)  43 min    Activity Tolerance  Patient tolerated treatment well    Behavior During Therapy  [facility] for tasks assessed/performed       Past Medical History:  Diagnosis Date  . CAD S/P percutaneous coronary angioplasty    hx MI 04/ 1994  s/p  PCI to LAD  . CKD (chronic kidney disease), stage III ([place])   . Complication of anesthesia    hard to wake   . History of basal cell carcinoma (BCC) excision    right eye area 08/ 2013  . History of kidney stones 01/20/2017  . History of MI (myocardial infarction) 09/1992   s/p  PCI to LAD  . History of squamous cell carcinoma excision    right lower leg 2015 ;  2016  . Hyperlipidemia   . Hypertension   . Macular degeneration   . PAF (paroxysmal atrial fibrillation) ([place]) dx 02-24-2016   followed by cardiologist-- dr Mamie Nick. [name]  . PONV (postoperative nausea and vomiting)   . Right ureteral stone   . Wears glasses     Past Surgical History:  Procedure Laterality Date  . ABDOMINAL HYSTERECTOMY    . BREAST EXCISIONAL BIOPSY Left 11/02/2009   fibroadenoma (lumpectomy)  . BROW LIFT Right 04/07/2014   Procedure: REPAIR OF ENTROPION AND TRICHIASIS OF RIGHT LOWER EYE LID ;  Surgeon: Theodoro Kos, DO;  Location: [facility];  Service: Plastics;  Laterality: Right;  . CARDIOVERSION N/A 07/18/2017   Procedure: CARDIOVERSION;   Surgeon: Skeet Latch, MD;  Location: [facility];  Service: Cardiovascular;  Laterality: N/A;  . CATARACT EXTRACTION W/ INTRAOCULAR LENS  IMPLANT, BILATERAL    . CHOLECYSTECTOMY N/A 12/06/2017   Procedure: LAPAROSCOPIC CHOLECYSTECTOMY;  Surgeon: Jovita Kussmaul, MD;  [facility];  Service: General;  Laterality: N/A;  . CORONARY ANGIOPLASTY  04/ 1994  dr Lia Foyer   PCI to LAD  . CYSTOSCOPY W/ URETERAL STENT PLACEMENT Right 01/20/2017   Procedure: CYSTOSCOPY WITH RIGHT RETROGRADE PYELOGRAM/RIGHT URETERAL STENT PLACEMENT;  Surgeon: Alexis Frock, MD;  [facility];  Service: Urology;  Laterality: Right;  . CYSTOSCOPY WITH RETROGRADE PYELOGRAM, URETEROSCOPY AND STENT PLACEMENT Right 02/15/2017   Procedure: CYSTOSCOPY WITH RETROGRADE PYELOGRAM, URETEROSCOPY, STONE BASKETRY AND STENT REPLACEMENT;  Surgeon: Alexis Frock, MD;  Location: [facility];  Service: Urology;  Laterality: Right;  . CYSTOSCOPY/RETROGRADE/URETEROSCOPY/STONE EXTRACTION WITH BASKET  yrs ago  . DILATION AND CURETTAGE OF UTERUS    . HOLMIUM LASER APPLICATION Right 4/00/8676   Procedure: HOLMIUM LASER APPLICATION;  Surgeon: Alexis Frock, MD;  Location: [facility];  Service: Urology;  Laterality: Right;  . KNEE SURGERY     right  . NEUROPLASTY / TRANSPOSITION ULNAR NERVE AT ELBOW Left 02-23-2000    [facility]   and  Left Carpal Tunnel Release  . TRANSTHORACIC ECHOCARDIOGRAM  02/26/2016   moderate focal basal and mild concentric LVH,  ef 55-60%,  akinesis of the basal-midanteroseptal, inferoseptal and apicalinferior myocardium, study not sufficient to evaluation diastolic funciton due to atrial fib./  trivial PR/  mild TR    There were no vitals filed for this visit.  Subjective Assessment - 03/05/18 1322    Subjective  Pt had fluid drained from R knee and injection.  Wearing a compression sleeve on R knee.  Reports pain has decreased dramatically.  Still has not performed balance exercises     Pertinent History  macular degeneration, CAD, HTN, PAF on Eliquis, CKD, excision skin cancer under right eye    Patient Stated Goals  walk without the cane; be able to walk to the mailbox    Currently in Pain?  No/denies       Reviewed and updated the following exercises below: pt required review of technique for sidebending stretch.  To cervical rotation added in isometric activation.  In supine performed cervical and scapular retraction for postural mm training.     Access Code: [email or web address]  URL: [email or web address]  Date: 03/05/2018  Prepared by: Misty Stanley   Exercises  Standing Gaze Stabilization with Head Rotation - 2 reps - 60 seconds hold - 1x daily - 5x weekly  Seated Isometric Cervical Rotation - 10 reps - 1 sets - 6 second hold - 1x daily - 7x weekly  Neck Rotation - 10 reps - 2 sets - 1x daily - 7x weekly  Seated Cervical Sidebending Stretch - 2 reps - 30 seconds hold - 1x daily - 7x weekly  Supine Cervical Retraction with Towel - 10 reps - 2 sets - 1x daily - 7x weekly  Supine Scapular Retraction - 10 reps - 2 sets - 1x daily - 7x weekly      Vestibular Treatment/Exercise - 03/05/18 1415      Vestibular Treatment/Exercise   Vestibular Treatment Provided  Gaze    Gaze Exercises  X1 Viewing Horizontal;X1 Viewing Vertical      X1 Viewing Horizontal   Foot Position  standing feet together    Reps  2    Comments  30 > 60 seconds      X1 Viewing Vertical   Foot Position  standing feet together    Reps  2    Comments  30 > 60 seconds            PT Education - 03/05/18 1414    Education Details  Reviewed and corrected stretching technique.  added isometric neck exercises.  Progressed x 1 viewing    Person(s) Educated  Patient    Methods  Explanation;Demonstration;Handout    Comprehension  Verbalized understanding;Returned demonstration       PT Short Term Goals - 02/27/18 1519      PT SHORT TERM GOAL #1   Title  Assess gait velocity with  cane with STG to be >1.81 ft/sec (indicative of lesser fall risk) (Target all STGs 02/28/2018)    Baseline  9/9 2.08 ft/sec    Time  4    Period  Weeks    Status  Achieved      PT SHORT TERM GOAL #2   Title  Patient will improve DGI score by 3 points ([place])    Baseline  17/24    Time  4    Period  Weeks    Status  Achieved      PT SHORT  TERM GOAL #3   Title  Patient will be independent with HEP for balance and strengthening    Baseline  still needs cues to perform x 1 viewing correctly    Time  4    Period  Weeks    Status  Partially Met        PT Long Term Goals - 01/29/18 1648      PT LONG TERM GOAL #1   Title  independent with updated HEP (target all LTGs 03/30/2018)    Time  8    Period  Weeks    Status  New    Target Date  03/30/18      PT LONG TERM GOAL #2   Title  Patient will ambulate 200 feet with LRAD modified independent on level surfaces with head turns over 40 ft of the entire distance    Time  8    Period  Weeks    Status  New      PT LONG TERM GOAL #3   Title  improve DGI score to >=19/28 to demonstrate lesser fall risk    Time  8    Period  Weeks    Status  New      PT LONG TERM GOAL #4   Title  Patient will improve gait velocity to >=2.62 ft/sec for safe community ambulation    Time  8    Period  Weeks    Status  New            Plan - 03/05/18 1418    Clinical Impression Statement  Did not perform standing balance and strengthening exercises today due to recent injection in R knee.  Instead continued to focus on review of neck stretches provided to pt last session.  Pt noted to be peforming lateral sidebending stretch incorrectly.  Reviewed stretches and added isometric strengthening in sitting and supine for postural muscles.  Pt able to tolerate gaze stabilization training in standing with feet together.  No increased pain in R knee or neck/UE with exercises.  Will continue to progress towards LTG.    Rehab Potential  Fair    Clinical  Impairments Affecting Rehab Potential  macular degeneration and limited cervical ROM can impact abiity to do vestibular rehab exercises; pt with bil LE sensory loss impacting ability to maintain balance    PT Frequency  2x / week    PT Duration  8 weeks    PT Treatment/Interventions  ADLs/Self Care Home Management;Gait training;DME Instruction;Functional mobility training;Therapeutic activities;Therapeutic exercise;Balance training;Neuromuscular re-education;Manual techniques;Patient/family education;Passive range of motion;Dry needling;Vestibular;Visual/perceptual remediation/compensation    PT Next Visit Plan  Review standing LE strengthening exercises and determine if pt needs to keep doing or not.  Progress x 1 viewing.  gait with head turns/retro/turns    PT Home Exercise Plan  Access Code: [email or web address]    Consulted and Agree with Plan of Care  Patient       Patient will benefit from skilled therapeutic intervention in order to improve the following deficits and impairments:  Abnormal gait, Decreased activity tolerance, Decreased balance, Decreased mobility, Decreased knowledge of use of DME, Decreased knowledge of precautions, Decreased safety awareness, Decreased strength, Dizziness, Increased fascial restricitons, Increased muscle spasms, Impaired flexibility, Impaired sensation, Postural dysfunction, Impaired vision/preception  Visit Diagnosis: Unsteadiness on feet  Other abnormalities of gait and mobility  Dizziness and giddiness  Abnormal posture     Problem List Patient Active Problem List   Diagnosis Date Noted  . Constipation   .  Acute on chronic cholecystitis s/p lap cholecystectomy 12/06/2017 12/04/2017  . Nephrolithiasis 12/04/2017  . Abdominal pain 12/04/2017  . Spinal stenosis in cervical region 04/24/2017  . Spinal stenosis of lumbar region 04/24/2017  . Dizziness 04/24/2017  . Ureteral stone with hydronephrosis 01/20/2017  . Atrial fibrillation with RVR ([place])  02/24/2016  . CKD (chronic kidney disease) stage 3, GFR 30-59 ml/min (H[facility]) 02/24/2016  . Paroxysmal atrial fibrillation ([place]) 02/24/2016  . Exudative macular degeneration ([place]) 02/24/2016  . CHOLELITHIASIS 02/14/2009  . HYPERLIPIDEMIA 12/29/2008  . Essential hypertension 12/29/2008  . MYOCARDIAL INFARCTION, HX OF 12/29/2008  . CAD (coronary artery disease) 12/29/2008    Rico Junker, PT, DPT 03/05/18    2:24 PM    [facility] [address] [place], [place], [address] Phone: [phone]   Fax:  [phone]  Name: LASHARN BUFKIN MRN: [id number] Date of Birth: Jan 05, 1923

## 2018-03-06 DIAGNOSIS — H35362 Drusen (degenerative) of macula, left eye: Secondary | ICD-10-CM | POA: Diagnosis not present

## 2018-03-06 DIAGNOSIS — H353211 Exudative age-related macular degeneration, right eye, with active choroidal neovascularization: Secondary | ICD-10-CM | POA: Diagnosis not present

## 2018-03-06 DIAGNOSIS — H353231 Exudative age-related macular degeneration, bilateral, with active choroidal neovascularization: Secondary | ICD-10-CM | POA: Diagnosis not present

## 2018-03-09 ENCOUNTER — Encounter (HOSPITAL_COMMUNITY): Payer: Self-pay | Admitting: Emergency Medicine

## 2018-03-09 ENCOUNTER — Emergency Department (HOSPITAL_COMMUNITY)
Admission: EM | Admit: 2018-03-09 | Discharge: 2018-03-09 | Disposition: A | Discharge status: Home / Self Care | Payer: Medicare Other | Attending: Emergency Medicine | Admitting: Emergency Medicine

## 2018-03-09 DIAGNOSIS — N183 Chronic kidney disease, stage 3 (moderate): Secondary | ICD-10-CM | POA: Insufficient documentation

## 2018-03-09 DIAGNOSIS — B028 Zoster with other complications: Secondary | ICD-10-CM

## 2018-03-09 DIAGNOSIS — I2510 Atherosclerotic heart disease of native coronary artery without angina pectoris: Secondary | ICD-10-CM | POA: Diagnosis not present

## 2018-03-09 DIAGNOSIS — Z79899 Other long term (current) drug therapy: Secondary | ICD-10-CM | POA: Diagnosis not present

## 2018-03-09 DIAGNOSIS — L03116 Cellulitis of left lower limb: Secondary | ICD-10-CM | POA: Insufficient documentation

## 2018-03-09 DIAGNOSIS — I480 Paroxysmal atrial fibrillation: Secondary | ICD-10-CM | POA: Diagnosis not present

## 2018-03-09 DIAGNOSIS — B029 Zoster without complications: Secondary | ICD-10-CM | POA: Insufficient documentation

## 2018-03-09 DIAGNOSIS — I252 Old myocardial infarction: Secondary | ICD-10-CM | POA: Insufficient documentation

## 2018-03-09 DIAGNOSIS — Z7901 Long term (current) use of anticoagulants: Secondary | ICD-10-CM | POA: Insufficient documentation

## 2018-03-09 DIAGNOSIS — I129 Hypertensive chronic kidney disease with stage 1 through stage 4 chronic kidney disease, or unspecified chronic kidney disease: Secondary | ICD-10-CM | POA: Insufficient documentation

## 2018-03-09 DIAGNOSIS — M79605 Pain in left leg: Secondary | ICD-10-CM | POA: Diagnosis present

## 2018-03-09 DIAGNOSIS — E785 Hyperlipidemia, unspecified: Secondary | ICD-10-CM | POA: Diagnosis not present

## 2018-03-09 LAB — COMPREHENSIVE METABOLIC PANEL
ALT: 14 U/L (ref 0–44)
AST: 17 U/L (ref 15–41)
Albumin: 3.7 g/dL (ref 3.5–5.0)
Alkaline Phosphatase: 103 U/L (ref 38–126)
Anion gap: 10 (ref 5–15)
BUN: 21 mg/dL (ref 8–23)
CO2: 22 mmol/L (ref 22–32)
Calcium: 9.2 mg/dL (ref 8.9–10.3)
Chloride: 104 mmol/L (ref 98–111)
Creatinine, Ser: 1.25 mg/dL — ABNORMAL HIGH (ref 0.44–1.00)
GFR calc Af Amer: 42 mL/min — ABNORMAL LOW (ref >60)
GFR calc non Af Amer: 36 mL/min — ABNORMAL LOW (ref >60)
Glucose, Bld: 121 mg/dL — ABNORMAL HIGH (ref 70–99)
Potassium: 3.9 mmol/L (ref 3.5–5.1)
Sodium: 136 mmol/L (ref 135–145)
Total Bilirubin: 1 mg/dL (ref 0.3–1.2)
Total Protein: 6.4 g/dL — ABNORMAL LOW (ref 6.5–8.1)

## 2018-03-09 LAB — CBC WITH DIFFERENTIAL/PLATELET
Abs Immature Granulocytes: 0.1 10*3/uL (ref 0.0–0.1)
Basophils Absolute: 0 10*3/uL (ref 0.0–0.1)
Basophils Relative: 0 %
Eosinophils Absolute: 0 10*3/uL (ref 0.0–0.7)
Eosinophils Relative: 0 %
HCT: 42.7 % (ref 36.0–46.0)
Hemoglobin: 13.1 g/dL (ref 12.0–15.0)
Immature Granulocytes: 1 %
Lymphocytes Relative: 9 %
Lymphs Abs: 1.4 10*3/uL (ref 0.7–4.0)
MCH: 27.3 pg (ref 26.0–34.0)
MCHC: 30.7 g/dL (ref 30.0–36.0)
MCV: 89.1 fL (ref 78.0–100.0)
Monocytes Absolute: 1.9 10*3/uL — ABNORMAL HIGH (ref 0.1–1.0)
Monocytes Relative: 11 %
Neutro Abs: 13.3 10*3/uL — ABNORMAL HIGH (ref 1.7–7.7)
Neutrophils Relative %: 79 %
Platelets: 200 10*3/uL (ref 150–400)
RBC: 4.79 MIL/uL (ref 3.87–5.11)
RDW: 16.2 % — ABNORMAL HIGH (ref 11.5–15.5)
WBC: 16.7 10*3/uL — ABNORMAL HIGH (ref 4.0–10.5)

## 2018-03-09 LAB — D-DIMER, QUANTITATIVE: D-Dimer, Quant: 0.62 ug/mL-FEU — ABNORMAL HIGH (ref 0.00–0.50)

## 2018-03-09 LAB — LIPASE, BLOOD: Lipase: 45 U/L (ref 11–51)

## 2018-03-09 MED ORDER — VALACYCLOVIR HCL 500 MG PO TABS
1000.0000 mg | ORAL_TABLET | Freq: Once | ORAL | Status: AC
  Administered 2018-03-09: 1000 mg via ORAL
  Filled 2018-03-09: qty 2

## 2018-03-09 MED ORDER — TRAMADOL HCL 50 MG PO TABS: 50.0000 mg | ORAL_TABLET | Freq: Four times a day (QID) | ORAL | 0 refills | Status: DC | PRN

## 2018-03-09 MED ORDER — CLINDAMYCIN HCL 150 MG PO CAPS: 150.0000 mg | ORAL_CAPSULE | Freq: Three times a day (TID) | ORAL | 0 refills | Status: DC

## 2018-03-09 MED ORDER — TRAMADOL HCL 50 MG PO TABS
50.0000 mg | ORAL_TABLET | Freq: Once | ORAL | Status: AC
  Administered 2018-03-09: 50 mg via ORAL
  Filled 2018-03-09: qty 1

## 2018-03-09 MED ORDER — ONDANSETRON HCL 4 MG/2ML IJ SOLN
4.0000 mg | Freq: Once | INTRAMUSCULAR | Status: DC
  Filled 2018-03-09: qty 2

## 2018-03-09 MED ORDER — FENTANYL CITRATE (PF) 100 MCG/2ML IJ SOLN
50.0000 ug | Freq: Once | INTRAMUSCULAR | Status: DC
  Filled 2018-03-09: qty 2

## 2018-03-09 MED ORDER — VALACYCLOVIR HCL 1 G PO TABS: 1000.0000 mg | ORAL_TABLET | Freq: Three times a day (TID) | ORAL | 0 refills | Status: DC

## 2018-03-09 NOTE — Discharge Instructions (Addendum)
Contact a health care provider if: Your pain is not relieved with prescribed medicines. Your pain does not get better after the rash heals. Your rash looks infected. Signs of infection include redness, swelling, and pain that lasts or increases. Get help right away if: The rash is on your face or nose. You have facial pain, pain around your eye area, or loss of feeling on one side of your face. You have ear pain or you have ringing in your ear. You have loss of taste. Your condition gets worse.

## 2018-03-09 NOTE — ED Provider Notes (Signed)
[facility] EMERGENCY DEPARTMENT Provider Note   CSN: [id number] Arrival date & time: 03/09/18  0909     History   Chief Complaint Chief Complaint  Patient presents with  . Leg Pain    HPI Shelly Obrien is a 84 y.o. female who presents the emergency department with chief complaint of left lower extremity pain.  She is a past medical history of coronary artery disease, A. fib with RVR on chronic anticoagulation with Eliquis, chronic kidney disease, history of MI, history of kidney stones and spinal stenosis.  She presents with severe pain in her left lower extremity.  She states that she bumped her leg on a table about 1 week ago.  She has had no issues with the leg until it began to hurt last night.  She states the pain feels sharp, shooting, and electrical.  She states that the pain was so severe today she was unable to ambulate.  Yesterday she had prodromal symptoms of shaking chills and myalgias.  She denies chest pain or shortness of breath but does complain of some nausea.  HPI  Past Medical History:  Diagnosis Date  . CAD S/P percutaneous coronary angioplasty    hx MI 04/ 1994  s/p  PCI to LAD  . CKD (chronic kidney disease), stage III ([place])   . Complication of anesthesia    hard to wake   . History of basal cell carcinoma (BCC) excision    right eye area 08/ 2013  . History of kidney stones 01/20/2017  . History of MI (myocardial infarction) 09/1992   s/p  PCI to LAD  . History of squamous cell carcinoma excision    right lower leg 2015 ;  2016  . Hyperlipidemia   . Hypertension   . Macular degeneration   . PAF (paroxysmal atrial fibrillation) ([place]) dx 02-24-2016   followed by cardiologist-- dr Mamie Nick. [name]  . PONV (postoperative nausea and vomiting)   . Right ureteral stone   . Wears glasses     Patient Active Problem List   Diagnosis Date Noted  . Constipation   . Acute on chronic cholecystitis s/p lap cholecystectomy 12/06/2017 12/04/2017  .  Nephrolithiasis 12/04/2017  . Abdominal pain 12/04/2017  . Spinal stenosis in cervical region 04/24/2017  . Spinal stenosis of lumbar region 04/24/2017  . Dizziness 04/24/2017  . Ureteral stone with hydronephrosis 01/20/2017  . Atrial fibrillation with RVR ([place]) 02/24/2016  . CKD (chronic kidney disease) stage 3, GFR 30-59 ml/min ([facility]) 02/24/2016  . Paroxysmal atrial fibrillation ([place]) 02/24/2016  . Exudative macular degeneration ([place]) 02/24/2016  . CHOLELITHIASIS 02/14/2009  . HYPERLIPIDEMIA 12/29/2008  . Essential hypertension 12/29/2008  . MYOCARDIAL INFARCTION, HX OF 12/29/2008  . CAD (coronary artery disease) 12/29/2008    Past Surgical History:  Procedure Laterality Date  . ABDOMINAL HYSTERECTOMY    . BREAST EXCISIONAL BIOPSY Left 11/02/2009   fibroadenoma (lumpectomy)  . BROW LIFT Right 04/07/2014   Procedure: REPAIR OF ENTROPION AND TRICHIASIS OF RIGHT LOWER EYE LID ;  Surgeon: Theodoro Kos, DO;  Location: [facility];  Service: Plastics;  Laterality: Right;  . CARDIOVERSION N/A 07/18/2017   Procedure: CARDIOVERSION;  Surgeon: Skeet Latch, MD;  Location: [facility];  Service: Cardiovascular;  Laterality: N/A;  . CATARACT EXTRACTION W/ INTRAOCULAR LENS  IMPLANT, BILATERAL    . CHOLECYSTECTOMY N/A 12/06/2017   Procedure: LAPAROSCOPIC CHOLECYSTECTOMY;  Surgeon: Jovita Kussmaul, MD;  [facility];  Service: General;  Laterality: N/A;  .  CORONARY ANGIOPLASTY  04/ 1994  dr Lia Foyer   PCI to LAD  . CYSTOSCOPY W/ URETERAL STENT PLACEMENT Right 01/20/2017   Procedure: CYSTOSCOPY WITH RIGHT RETROGRADE PYELOGRAM/RIGHT URETERAL STENT PLACEMENT;  Surgeon: Alexis Frock, MD;  [facility];  Service: Urology;  Laterality: Right;  . CYSTOSCOPY WITH RETROGRADE PYELOGRAM, URETEROSCOPY AND STENT PLACEMENT Right 02/15/2017   Procedure: CYSTOSCOPY WITH RETROGRADE PYELOGRAM, URETEROSCOPY, STONE BASKETRY AND STENT REPLACEMENT;  Surgeon: Alexis Frock, MD;  [facility];  Service: Urology;  Laterality: Right;  . CYSTOSCOPY/RETROGRADE/URETEROSCOPY/STONE EXTRACTION WITH BASKET  yrs ago  . DILATION AND CURETTAGE OF UTERUS    . HOLMIUM LASER APPLICATION Right 09/30/7679   Procedure: HOLMIUM LASER APPLICATION;  Surgeon: Alexis Frock, MD;  Location: [facility];  Service: Urology;  Laterality: Right;  . KNEE SURGERY     right  . NEUROPLASTY / TRANSPOSITION ULNAR NERVE AT ELBOW Left 02-23-2000    [facility]   and Left Carpal Tunnel Release  . TRANSTHORACIC ECHOCARDIOGRAM  02/26/2016   moderate focal basal and mild concentric LVH,  ef 55-60%,  akinesis of the basal-midanteroseptal, inferoseptal and apicalinferior myocardium, study not sufficient to evaluation diastolic funciton due to atrial fib./  trivial PR/  mild TR     OB History   None      Home Medications    Prior to Admission medications   Medication Sig Start Date End Date Taking? Authorizing Provider  acetaminophen (TYLENOL) 325 MG tablet Take 325 mg by mouth daily as needed for mild pain.     [provider]  diltiazem (CARDIZEM CD) 120 MG 24 hr capsule TAKE ONE CAPSULE BY MOUTH TWICE DAILY Patient taking differently: Take 120 mg by mouth 2 (two) times daily.  10/02/17   Sherran Needs, NP  ELIQUIS 5 MG TABS tablet TAKE 1 TABLET BY MOUTH TWICE DAILY Patient taking differently: Take 5 mg by mouth 2 (two) times daily.  11/27/17   Sherran Needs, NP  Multiple Vitamins-Minerals (ICAPS AREDS 2) CAPS Take 2 capsules by mouth daily.     [provider]  pantoprazole (PROTONIX) 20 MG tablet Take 1 tablet (20 mg total) by mouth daily. Patient not taking: Reported on 01/29/2018 11/30/17   Dorie Rank, MD  Polyethyl Glycol-Propyl Glycol (SYSTANE OP) Place 1 drop into both eyes daily as needed (itchy eyes).    [provider]  polyethylene glycol (MIRALAX / GLYCOLAX) packet Take 17 g by mouth daily. Patient not taking: Reported on 01/29/2018  12/11/17   Hosie Poisson, MD  senna-docusate (SENOKOT-S) 8.6-50 MG tablet Take 2 tablets by mouth at bedtime. Patient not taking: Reported on 01/29/2018 12/15/17   Florencia Reasons, MD  simethicone [facility]) 40 LX/7.2IO drops Take 0.6 mLs (40 mg total) by mouth 4 (four) times daily as needed for flatulence. Patient not taking: Reported on 01/29/2018 12/10/17   Hosie Poisson, MD  traMADol (ULTRAM) 50 MG tablet Take 2 tablets (100 mg total) by mouth every 8 (eight) hours as needed for moderate pain. Patient not taking: Reported on 01/29/2018 12/11/17   Eugenie Filler, MD  triamcinolone (NASACORT ALLERGY 24HR) 55 MCG/ACT AERO nasal inhaler Place 1-2 sprays into the nose 2 (two) times daily as needed (for rhinitis).     [provider]    Family History Family History  Problem Relation Age of Onset  . Coronary artery disease Unknown        positive family hx of  . Cancer Unknown  family hx of  . Brain cancer Son   . Liver cancer Daughter     Social History Social History   Tobacco Use  . Smoking status: Never Smoker  . Smokeless tobacco: Never Used  Substance Use Topics  . Alcohol use: No    Alcohol/week: 0.0 standard drinks  . Drug use: No     Allergies   Antihistamines, chlorpheniramine-type; Penicillins; Contrast media [iodinated diagnostic agents]; Sulfonamide derivatives; Atorvastatin; and Pheniramine   Review of Systems Review of Systems Ten systems reviewed and are negative for acute change, except as noted in the HPI.    Physical Exam Updated Vital Signs BP (!) 149/97 (BP Location: Right Arm)   Pulse 98   Temp 98.1 F (36.7 C) (Oral)   Resp 18   SpO2 98%   Physical Exam  Constitutional: She is oriented to person, place, and time. She appears well-developed and well-nourished. No distress.  HENT:  Head: Normocephalic and atraumatic.  Eyes: Pupils are equal, round, and reactive to light. Conjunctivae and EOM are normal. No scleral icterus.  Ectropion of  the right lower eyelid  Neck: Normal range of motion.  Cardiovascular: Normal rate, regular rhythm and normal heart sounds. Exam reveals no gallop and no friction rub.  No murmur heard. Pulmonary/Chest: Effort normal and breath sounds normal. No respiratory distress.  Abdominal: Soft. Bowel sounds are normal. She exhibits no distension and no mass. There is no tenderness. There is no guarding.  Musculoskeletal:  Left lower extremity is swollen as compared to the right.  There is deep, violaceous erythema along the medial aspect of her calf which is exquisitely tender to palpation.  Several small vesicles are present on the dorsum of the left foot.  Neurological: She is alert and oriented to person, place, and time.  Skin: Skin is warm and dry. She is not diaphoretic.  Psychiatric: Her behavior is normal.  Nursing note and vitals reviewed.    ED Treatments / Results  Labs (all labs ordered are listed, but only abnormal results are displayed) Labs Reviewed  CBC WITH DIFFERENTIAL/PLATELET - Abnormal; Notable for the following components:      Result Value   WBC 16.7 (*)    RDW 16.2 (*)    Neutro Abs 13.3 (*)    Monocytes Absolute 1.9 (*)    All other components within normal limits  D-DIMER, QUANTITATIVE (NOT AT [facility])  COMPREHENSIVE METABOLIC PANEL  LIPASE, BLOOD    EKG None  Radiology No results found.  Procedures Procedures (including critical care time)  Medications Ordered in ED Medications  valACYclovir (VALTREX) tablet 1,000 mg (has no administration in time range)  traMADol (ULTRAM) tablet 50 mg (has no administration in time range)     Initial Impression / Assessment and Plan / ED Course  I have reviewed the triage vital signs and the nursing notes.  Pertinent labs & imaging results that were available during my care of the patient were reviewed by me and considered in my medical decision making (see chart for details).  Clinical Course as of Mar 10 1035  Nancy Fetter  Mar 09, 2018  1033 Patient with elevated white blood cell count.  WBC(!): 16.7 [AH]  [address] [place] 84 year old female with left lower extremity pain and swelling.  She appears to have a herpes zoster eruption in the L4-L5 dermatome of the lower extremity.  May have some concurrent cellulitis.  I have lower suspicion for DVT given her history of use of Eliquis and compliance with the  medication however we will evaluate with a d-dimer.  I weighed the benefits versus risks of using d-dimer as a rule out however I think that her pain is very poorly controlled and she would not tolerate an ultrasound very well.  This is further complicated by the fact that she has hallucinations and intolerance with narcotic pain medications.  Patient will take a tramadol currently.  She is been given oral Valtrex and placed on airborne precautions.   [AH]    Clinical Course User Index [AH] Margarita Mail, PA-C    84 year old female with left lower extremity pain.  Her d-dimer is age-adjusted negative and I have very low suspicion for DVT.  She has obvious herpes zoster outbreak of the lower extremity.  Patient may have a secondary cellulitis and will be treated with both Valtrex and Keflex.  She is able to ambulate with a walker and her pain is significantly improved with tramadol.  She has been started on Valtrex within 24 hours of the onset of her pain which will hopefully give her a good outcome with this shingles outbreak.  She appears otherwise hemodynamically stable, afebrile and appropriate for discharge.  She was seen and shared visit with Dr. Laverta Baltimore who agrees with work-up and plan.  Asked her to follow-up within the next 7 to 10 days with her primary care physician and have discussed return precautions.  Final Clinical Impressions(s) / ED Diagnoses   Final diagnoses:  Herpes zoster with other complication  Cellulitis of left lower extremity    ED Discharge Orders    None       Margarita Mail,  PA-C 03/09/18 1613    Long, [facility], MD 03/09/18 1720

## 2018-03-09 NOTE — ED Triage Notes (Signed)
Pt reports hitting her lower left leg on an object last week, states leg just began hurting last night. Lower leg warm, red and swelling present to distal, lateral area of ankle. Pt reports she is on eloquis.

## 2018-03-10 ENCOUNTER — Encounter: Payer: Medicare Other | Admitting: Physical Therapy

## 2018-03-12 ENCOUNTER — Encounter: Payer: Medicare Other | Admitting: Physical Therapy

## 2018-03-17 ENCOUNTER — Encounter: Payer: Medicare Other | Admitting: Physical Therapy

## 2018-03-17 ENCOUNTER — Ambulatory Visit: Payer: Medicare Other | Admitting: Cardiology

## 2018-03-18 ENCOUNTER — Encounter: Payer: Self-pay | Admitting: Cardiology

## 2018-03-19 ENCOUNTER — Encounter: Payer: Medicare Other | Admitting: Physical Therapy

## 2018-03-20 DIAGNOSIS — H353231 Exudative age-related macular degeneration, bilateral, with active choroidal neovascularization: Secondary | ICD-10-CM | POA: Diagnosis not present

## 2018-03-24 ENCOUNTER — Encounter: Payer: Medicare Other | Admitting: Physical Therapy

## 2018-03-25 DIAGNOSIS — Z23 Encounter for immunization: Secondary | ICD-10-CM | POA: Diagnosis not present

## 2018-03-25 DIAGNOSIS — I10 Essential (primary) hypertension: Secondary | ICD-10-CM | POA: Diagnosis not present

## 2018-03-25 DIAGNOSIS — I2510 Atherosclerotic heart disease of native coronary artery without angina pectoris: Secondary | ICD-10-CM | POA: Diagnosis not present

## 2018-03-25 DIAGNOSIS — R269 Unspecified abnormalities of gait and mobility: Secondary | ICD-10-CM | POA: Diagnosis not present

## 2018-03-25 DIAGNOSIS — I480 Paroxysmal atrial fibrillation: Secondary | ICD-10-CM | POA: Diagnosis not present

## 2018-03-26 ENCOUNTER — Encounter: Payer: Medicare Other | Admitting: Physical Therapy

## 2018-03-31 ENCOUNTER — Encounter: Payer: Medicare Other | Admitting: Physical Therapy

## 2018-04-01 ENCOUNTER — Encounter: Payer: Self-pay | Admitting: Physical Therapy

## 2018-04-01 ENCOUNTER — Ambulatory Visit: Payer: Medicare Other | Admitting: Physical Therapy

## 2018-04-01 DIAGNOSIS — R2681 Unsteadiness on feet: Secondary | ICD-10-CM | POA: Diagnosis not present

## 2018-04-01 DIAGNOSIS — R2689 Other abnormalities of gait and mobility: Secondary | ICD-10-CM | POA: Diagnosis not present

## 2018-04-01 DIAGNOSIS — R42 Dizziness and giddiness: Secondary | ICD-10-CM | POA: Diagnosis not present

## 2018-04-01 DIAGNOSIS — R293 Abnormal posture: Secondary | ICD-10-CM | POA: Diagnosis not present

## 2018-04-02 ENCOUNTER — Telehealth: Payer: Self-pay | Admitting: Internal Medicine

## 2018-04-02 ENCOUNTER — Encounter: Payer: Medicare Other | Admitting: Physical Therapy

## 2018-04-02 NOTE — Telephone Encounter (Signed)
FYI

## 2018-04-02 NOTE — Telephone Encounter (Signed)
New Message   Patient is wanting to switch providers. She would like to switch from Dr. [name] to Dr. Radford Pax. Telephone note was sent on 7/18 and Dr. [name] is okay with the patient switching providers. Now we need for Dr. Radford Pax to approve of the switch as well as Dr. [name] to re-verify the change request is okay. Please advise.

## 2018-04-02 NOTE — Telephone Encounter (Signed)
Dr. Acie Fredrickson has been asked to see this patient by scheduling. I called patient and left a message per his request to get an understanding of why she wants to change providers.

## 2018-04-02 NOTE — Therapy (Signed)
[facility] [address] [place], [place], [address] Phone: [phone]   Fax:  [phone]  Physical Therapy Treatment  Patient Details  Name: Shelly Obrien MRN: [id number] Date of Birth: [birth year] Referring Provider (PT): [name], MD   Encounter Date: 04/01/2018  PT End of Session - 04/02/18 1055    Visit Number  9    Number of Visits  17    Date for PT Re-Evaluation  03/30/18    Authorization Type  Medicare A/B    Authorization Time Period  01/29/18 to 04/29/2018    PT Start Time  1450    PT Stop Time  1533    PT Time Calculation (min)  43 min       Past Medical History:  Diagnosis Date  . CAD S/P percutaneous coronary angioplasty    hx MI 04/ 1994  s/p  PCI to LAD  . CKD (chronic kidney disease), stage III ([place])   . Complication of anesthesia    hard to wake   . History of basal cell carcinoma (BCC) excision    right eye area 08/ 2013  . History of kidney stones 01/20/2017  . History of MI (myocardial infarction) 09/1992   s/p  PCI to LAD  . History of squamous cell carcinoma excision    right lower leg 2015 ;  2016  . Hyperlipidemia   . Hypertension   . Macular degeneration   . PAF (paroxysmal atrial fibrillation) ([place]) dx 02-24-2016   followed by cardiologist-- dr Mamie Nick. [name]  . PONV (postoperative nausea and vomiting)   . Right ureteral stone   . Wears glasses     Past Surgical History:  Procedure Laterality Date  . ABDOMINAL HYSTERECTOMY    . BREAST EXCISIONAL BIOPSY Left 11/02/2009   fibroadenoma (lumpectomy)  . BROW LIFT Right 04/07/2014   Procedure: REPAIR OF ENTROPION AND TRICHIASIS OF RIGHT LOWER EYE LID ;  Surgeon: Theodoro Kos, DO;  Location: [facility];  Service: Plastics;  Laterality: Right;  . CARDIOVERSION N/A 07/18/2017   Procedure: CARDIOVERSION;  Surgeon: Skeet Latch, MD;  Location: [facility];  Service: Cardiovascular;  Laterality: N/A;  . CATARACT  EXTRACTION W/ INTRAOCULAR LENS  IMPLANT, BILATERAL    . CHOLECYSTECTOMY N/A 12/06/2017   Procedure: LAPAROSCOPIC CHOLECYSTECTOMY;  Surgeon: Jovita Kussmaul, MD;  Location: [facility];  Service: General;  Laterality: N/A;  . CORONARY ANGIOPLASTY  04/ 1994  dr Lia Foyer   PCI to LAD  . CYSTOSCOPY W/ URETERAL STENT PLACEMENT Right 01/20/2017   Procedure: CYSTOSCOPY WITH RIGHT RETROGRADE PYELOGRAM/RIGHT URETERAL STENT PLACEMENT;  Surgeon: Alexis Frock, MD;  Location: [facility];  Service: Urology;  Laterality: Right;  . CYSTOSCOPY WITH RETROGRADE PYELOGRAM, URETEROSCOPY AND STENT PLACEMENT Right 02/15/2017   Procedure: CYSTOSCOPY WITH RETROGRADE PYELOGRAM, URETEROSCOPY, STONE BASKETRY AND STENT REPLACEMENT;  Surgeon: Alexis Frock, MD;  Location: [facility];  Service: Urology;  Laterality: Right;  . CYSTOSCOPY/RETROGRADE/URETEROSCOPY/STONE EXTRACTION WITH BASKET  yrs ago  . DILATION AND CURETTAGE OF UTERUS    . HOLMIUM LASER APPLICATION Right 4/49/6759   Procedure: HOLMIUM LASER APPLICATION;  Surgeon: Alexis Frock, MD;  Location: [facility];  Service: Urology;  Laterality: Right;  . KNEE SURGERY     right  . NEUROPLASTY / TRANSPOSITION ULNAR NERVE AT ELBOW Left 02-23-2000    [facility]   and Left Carpal Tunnel Release  . TRANSTHORACIC ECHOCARDIOGRAM  02/26/2016   moderate focal basal and mild concentric LVH,  ef 55-60%,  akinesis of the basal-midanteroseptal, inferoseptal and apicalinferior myocardium, study not sufficient to evaluation diastolic funciton due to atrial fib./  trivial PR/  mild TR    There were no vitals filed for this visit.  Subjective Assessment - 04/02/18 1051    Subjective  Pt states she had shingles - says MD released her last week; says she has been busy today; states her balance is off; also has had problems with pain radiating down her arm from her neck: says she may need another injection in her neck    Pertinent History  macular degeneration, CAD,  HTN, PAF on Eliquis, CKD, excision skin cancer under right eye    Patient Stated Goals  walk without the cane; be able to walk to the mailbox                       [facility] Adult PT Treatment/Exercise - 04/02/18 0001      Transfers   Transfers  Sit to Stand;Stand to Sit    Sit to Stand  5: Supervision    Number of Reps  10 reps   5 reps with feet on floor; 5 with feet on [employer]     Ambulation/Gait   Ambulation/Gait  Yes    Ambulation/Gait Assistance  5: Supervision    Ambulation Distance (Feet)  115 Feet    Assistive device  None    Gait Pattern  Step-through pattern          Balance Exercises - 04/02/18 1053      Balance Exercises: Standing   Standing Eyes Closed  Wide (BOA);Head turns;5 reps   horizontal and vertical head turns    NeuroRe-ed: pt performed standing balance exercises - marching in place, forward, back and side kicks 10 reps each leg with min UE support on // bar Rockerboard with bil. UE support 10 reps inside //bars Standing on blue balance beam to facilitate hip strategy - lifting opposite UE's 5 reps each  Stepping over and back of balance beam with UE support prn for improved SLS Rolling ball forward/back 10 reps each with 1 UE support on //bar for improved coordination and for improved SLS - 10 reps each leg   PT Short Term Goals - 02/27/18 1519      PT SHORT TERM GOAL #1   Title  Assess gait velocity with cane with STG to be >1.81 ft/sec (indicative of lesser fall risk) (Target all STGs 02/28/2018)    Baseline  9/9 2.08 ft/sec    Time  4    Period  Weeks    Status  Achieved      PT SHORT TERM GOAL #2   Title  Patient will improve DGI score by 3 points ([place])    Baseline  17/24    Time  4    Period  Weeks    Status  Achieved      PT SHORT TERM GOAL #3   Title  Patient will be independent with HEP for balance and strengthening    Baseline  still needs cues to perform x 1 viewing correctly    Time  4    Period  Weeks    Status   Partially Met        PT Long Term Goals - 01/29/18 1648      PT LONG TERM GOAL #1   Title  independent with updated HEP (target all LTGs 03/30/2018)    Time  8  Period  Weeks    Status  New    Target Date  03/30/18      PT LONG TERM GOAL #2   Title  Patient will ambulate 200 feet with LRAD modified independent on level surfaces with head turns over 40 ft of the entire distance    Time  8    Period  Weeks    Status  New      PT LONG TERM GOAL #3   Title  improve DGI score to >=19/28 to demonstrate lesser fall risk    Time  8    Period  Weeks    Status  New      PT LONG TERM GOAL #4   Title  Patient will improve gait velocity to >=2.62 ft/sec for safe community ambulation    Time  8    Period  Weeks    Status  New            Plan - 04/02/18 1057    Clinical Impression Statement  Pt improving with standing balance with minimal UE support on // bar needed for safety; pt demonstrates decreased high level balance skills (age-related) and also demonstrates some unsteadiness upon initial sit to stand transfer - needs object to assist in stabilization for safety    Clinical Impairments Affecting Rehab Potential  macular degeneration and limited cervical ROM can impact abiity to do vestibular rehab exercises; pt with bil LE sensory loss impacting ability to maintain balance    PT Frequency  2x / week    PT Duration  8 weeks    PT Treatment/Interventions  ADLs/Self Care Home Management;Gait training;DME Instruction;Functional mobility training;Therapeutic activities;Therapeutic exercise;Balance training;Neuromuscular re-education;Manual techniques;Patient/family education;Passive range of motion;Dry needling;Vestibular;Visual/perceptual remediation/compensation    PT Next Visit Plan  10th visit progress note due - prepare for upcoming D/C?    PT Home Exercise Plan  Access Code: [email or web address]    Consulted and Agree with Plan of Care  Patient       Patient will benefit from skilled  therapeutic intervention in order to improve the following deficits and impairments:  Abnormal gait, Decreased activity tolerance, Decreased balance, Decreased mobility, Decreased knowledge of use of DME, Decreased knowledge of precautions, Decreased safety awareness, Decreased strength, Dizziness, Increased fascial restricitons, Increased muscle spasms, Impaired flexibility, Impaired sensation, Postural dysfunction, Impaired vision/preception  Visit Diagnosis: Unsteadiness on feet  Other abnormalities of gait and mobility     Problem List Patient Active Problem List   Diagnosis Date Noted  . Constipation   . Acute on chronic cholecystitis s/p lap cholecystectomy 12/06/2017 12/04/2017  . Nephrolithiasis 12/04/2017  . Abdominal pain 12/04/2017  . Spinal stenosis in cervical region 04/24/2017  . Spinal stenosis of lumbar region 04/24/2017  . Dizziness 04/24/2017  . Ureteral stone with hydronephrosis 01/20/2017  . Atrial fibrillation with RVR ([place]) 02/24/2016  . CKD (chronic kidney disease) stage 3, GFR 30-59 ml/min (H[facility]) 02/24/2016  . Paroxysmal atrial fibrillation ([place]) 02/24/2016  . Exudative macular degeneration ([place]) 02/24/2016  . CHOLELITHIASIS 02/14/2009  . HYPERLIPIDEMIA 12/29/2008  . Essential hypertension 12/29/2008  . MYOCARDIAL INFARCTION, HX OF 12/29/2008  . CAD (coronary artery disease) 12/29/2008    Londynn Sonoda, [place], PT 04/02/2018, 11:12 AM  [facility] [address] [place], [place], [address] Phone: [phone]   Fax:  [phone]  Name: Shelly Obrien MRN: [id number] Date of Birth: 10/21/1922

## 2018-04-02 NOTE — Telephone Encounter (Signed)
I have sent a note twice saying that I would not accept this patient

## 2018-04-02 NOTE — Telephone Encounter (Signed)
I have seen the patient several times over the past few years. If she wants to switch,  I am OK with switch

## 2018-04-03 NOTE — Telephone Encounter (Signed)
Spoke with patient to ask reason for changing cardiologists and she explains that it was a personality preference. She also expressed frustration that she came into the office on 10/14 and was not seen because "Dr. [name] did not release me." I advised that was not the reason she wasn't seen and that Dr. [name] had done her part for the patient's transfer. She expressed frustration that she was admitted to the hospital and Dr. [name] did not come to see her. I explained that our providers are scheduled for clinic days, procedures, and rounding days and that there are often times the provider cannot see their own patient once admitted to the hospital. She states Dr. Lia Foyer would always come to see her in the past. I advised that I will ask our scheduler to schedule an appointment with Dr. Acie Fredrickson if she is in agreement and she confirms. She thanked me for my help.

## 2018-04-08 ENCOUNTER — Ambulatory Visit: Payer: Medicare Other | Attending: Internal Medicine | Admitting: Physical Therapy

## 2018-04-08 ENCOUNTER — Encounter: Payer: Self-pay | Admitting: Physical Therapy

## 2018-04-08 DIAGNOSIS — R2689 Other abnormalities of gait and mobility: Secondary | ICD-10-CM | POA: Insufficient documentation

## 2018-04-08 DIAGNOSIS — R2681 Unsteadiness on feet: Secondary | ICD-10-CM | POA: Diagnosis not present

## 2018-04-08 DIAGNOSIS — R293 Abnormal posture: Secondary | ICD-10-CM | POA: Diagnosis not present

## 2018-04-09 NOTE — Therapy (Signed)
[facility] [address] [place], [place], [address] Phone: [phone]   Fax:  [phone]  Physical Therapy Treatment  Patient Details  Name: Shelly Obrien MRN: [id number] Date of Birth: June 02, 1923 Referring Provider (PT): [name], MD   Encounter Date: 04/08/2018   This progress note covers time period of 01/29/18 to 04/08/18:  PT End of Session - 04/08/18 1443    Visit Number  10    Number of Visits  17    Date for PT Re-Evaluation  03/30/18    Authorization Type  Medicare A/B    Authorization Time Period  01/29/18 to 04/29/2018    PT Start Time  1359    PT Stop Time  1440    PT Time Calculation (min)  41 min    Equipment Utilized During Treatment  Gait belt    Activity Tolerance  Patient tolerated treatment well    Behavior During Therapy  [facility] for tasks assessed/performed       Past Medical History:  Diagnosis Date  . CAD S/P percutaneous coronary angioplasty    hx MI 04/ 1994  s/p  PCI to LAD  . CKD (chronic kidney disease), stage III ([place])   . Complication of anesthesia    hard to wake   . History of basal cell carcinoma (BCC) excision    right eye area 08/ 2013  . History of kidney stones 01/20/2017  . History of MI (myocardial infarction) 09/1992   s/p  PCI to LAD  . History of squamous cell carcinoma excision    right lower leg 2015 ;  2016  . Hyperlipidemia   . Hypertension   . Macular degeneration   . PAF (paroxysmal atrial fibrillation) ([place]) dx 02-24-2016   followed by cardiologist-- dr Mamie Nick. [name]  . PONV (postoperative nausea and vomiting)   . Right ureteral stone   . Wears glasses     Past Surgical History:  Procedure Laterality Date  . ABDOMINAL HYSTERECTOMY    . BREAST EXCISIONAL BIOPSY Left 11/02/2009   fibroadenoma (lumpectomy)  . BROW LIFT Right 04/07/2014   Procedure: REPAIR OF ENTROPION AND TRICHIASIS OF RIGHT LOWER EYE LID ;  Surgeon: Theodoro Kos, DO;  Location: [facility];  Service: Plastics;  Laterality: Right;  . CARDIOVERSION N/A 07/18/2017   Procedure: CARDIOVERSION;  Surgeon: Skeet Latch, MD;  Location: [facility];  Service: Cardiovascular;  Laterality: N/A;  . CATARACT EXTRACTION W/ INTRAOCULAR LENS  IMPLANT, BILATERAL    . CHOLECYSTECTOMY N/A 12/06/2017   Procedure: LAPAROSCOPIC CHOLECYSTECTOMY;  Surgeon: Jovita Kussmaul, MD;  [facility];  Service: General;  Laterality: N/A;  . CORONARY ANGIOPLASTY  04/ 1994  dr Lia Foyer   PCI to LAD  . CYSTOSCOPY W/ URETERAL STENT PLACEMENT Right 01/20/2017   Procedure: CYSTOSCOPY WITH RIGHT RETROGRADE PYELOGRAM/RIGHT URETERAL STENT PLACEMENT;  Surgeon: Alexis Frock, MD;  [facility];  Service: Urology;  Laterality: Right;  . CYSTOSCOPY WITH RETROGRADE PYELOGRAM, URETEROSCOPY AND STENT PLACEMENT Right 02/15/2017   Procedure: CYSTOSCOPY WITH RETROGRADE PYELOGRAM, URETEROSCOPY, STONE BASKETRY AND STENT REPLACEMENT;  Surgeon: Alexis Frock, MD;  Location: [facility];  Service: Urology;  Laterality: Right;  . CYSTOSCOPY/RETROGRADE/URETEROSCOPY/STONE EXTRACTION WITH BASKET  yrs ago  . DILATION AND CURETTAGE OF UTERUS    . HOLMIUM LASER APPLICATION Right 1/32/4401   Procedure: HOLMIUM LASER APPLICATION;  Surgeon: Alexis Frock, MD;  Location: [facility];  Service: Urology;  Laterality: Right;  . KNEE SURGERY  right  . NEUROPLASTY / TRANSPOSITION ULNAR NERVE AT ELBOW Left 02-23-2000    [facility]   and Left Carpal Tunnel Release  . TRANSTHORACIC ECHOCARDIOGRAM  02/26/2016   moderate focal basal and mild concentric LVH,  ef 55-60%,  akinesis of the basal-midanteroseptal, inferoseptal and apicalinferior myocardium, study not sufficient to evaluation diastolic funciton due to atrial fib./  trivial PR/  mild TR    There were no vitals filed for this visit.  Subjective Assessment - 04/08/18 1401    Subjective  Still having dizziness when looking up. The doctor is  going to give me an injection in my neck 05/08/18 and it will help with my dizziness. I can tell my balance is a little better. Yesterday I went for a walk without my cane and did well.     Pertinent History  macular degeneration, CAD, HTN, PAF on Eliquis, CKD, excision skin cancer under right eye    Patient Stated Goals  walk without the cane; be able to walk to the mailbox    Currently in Pain?  No/denies                       [facility] Adult PT Treatment/Exercise - 04/08/18 1432      Knee/Hip Exercises: Standing   Hip Flexion  Stengthening;Both;1 set;10 reps    Hip Abduction  Stengthening;Both;1 set;10 reps;Knee straight    Other Standing Knee Exercises  sit to stand wth feet apart on blue beam with min-minguard assist x10 with slow, controlled descent          Balance Exercises - 04/08/18 1430      Balance Exercises: Standing   Rockerboard  Anterior/posterior;EO;10 reps;Intermittent UE support   bow fwd and return to upright standing x 10   Step Ups  Forward;4 inch;Intermittent UE support   5 each leg   Balance Beam  black beam sideways feet apart, to elicit hip strategy and then step strategy    Gait with Head Turns  Forward;3 reps   40 ft hall   Retro Gait  2 reps   40 ft         PT Short Term Goals - 02/27/18 1519      PT SHORT TERM GOAL #1   Title  Assess gait velocity with cane with STG to be >1.81 ft/sec (indicative of lesser fall risk) (Target all STGs 02/28/2018)    Baseline  9/9 2.08 ft/sec    Time  4    Period  Weeks    Status  Achieved      PT SHORT TERM GOAL #2   Title  Patient will improve DGI score by 3 points ([place])    Baseline  17/24    Time  4    Period  Weeks    Status  Achieved      PT SHORT TERM GOAL #3   Title  Patient will be independent with HEP for balance and strengthening    Baseline  still needs cues to perform x 1 viewing correctly    Time  4    Period  Weeks    Status  Partially Met        PT Long Term Goals -  04/09/18 1446      PT LONG TERM GOAL #1   Title  independent with updated HEP (target all LTGs 03/30/2018---Due to 3 missed weeks of PT, target date extended to 04/11/18)    Time  8    Period  Weeks    Status  On-going      PT LONG TERM GOAL #2   Title  Patient will ambulate 200 feet with LRAD modified independent on level surfaces with head turns over 40 ft of the entire distance    Time  8    Period  Weeks    Status  Achieved      PT LONG TERM GOAL #3   Title  improve DGI score to >=19/28 to demonstrate lesser fall risk    Time  8    Period  Weeks    Status  On-going      PT LONG TERM GOAL #4   Title  Patient will improve gait velocity to >=2.62 ft/sec for safe community ambulation    Time  8    Period  Weeks    Status  On-going            Plan - 04/09/18 1443    Clinical Impression Statement  Initiated session discussing areas where she feels she has improved (balance while walking; confidence) vs areas that are still problematic (dizziness with head turns--pt is convinced this is due to a pinched nerve in her neck that also causes LUE pain). Vestibular and balance training to assess areas of difficulty and provide appropriate challenge in order to force her systems to change/adapt. Patient had missed a total of 3 weeks of appts over course of planned 8 weeks. At end of session discussed plan to proceed with discharge next visit (last scheduled visit).     Clinical Impairments Affecting Rehab Potential  macular degeneration and limited cervical ROM can impact abiity to do vestibular rehab exercises; pt with bil LE sensory loss impacting ability to maintain balance    PT Frequency  2x / week    PT Duration  8 weeks    PT Treatment/Interventions  ADLs/Self Care Home Management;Gait training;DME Instruction;Functional mobility training;Therapeutic activities;Therapeutic exercise;Balance training;Neuromuscular re-education;Manual techniques;Patient/family education;Passive range  of motion;Dry needling;Vestibular;Visual/perceptual remediation/compensation    PT Next Visit Plan  chk LTGs and discharge    PT Home Exercise Plan  Access Code: [email or web address]    Consulted and Agree with Plan of Care  Patient       Patient will benefit from skilled therapeutic intervention in order to improve the following deficits and impairments:  Abnormal gait, Decreased activity tolerance, Decreased balance, Decreased mobility, Decreased knowledge of use of DME, Decreased knowledge of precautions, Decreased safety awareness, Decreased strength, Dizziness, Increased fascial restricitons, Increased muscle spasms, Impaired flexibility, Impaired sensation, Postural dysfunction, Impaired vision/preception  Visit Diagnosis: Unsteadiness on feet  Other abnormalities of gait and mobility  Abnormal posture     Problem List Patient Active Problem List   Diagnosis Date Noted  . Constipation   . Acute on chronic cholecystitis s/p lap cholecystectomy 12/06/2017 12/04/2017  . Nephrolithiasis 12/04/2017  . Abdominal pain 12/04/2017  . Spinal stenosis in cervical region 04/24/2017  . Spinal stenosis of lumbar region 04/24/2017  . Dizziness 04/24/2017  . Ureteral stone with hydronephrosis 01/20/2017  . Atrial fibrillation with RVR ([place]) 02/24/2016  . CKD (chronic kidney disease) stage 3, GFR 30-59 ml/min (HCC) 02/24/2016  . Paroxysmal atrial fibrillation ([place]) 02/24/2016  . Exudative macular degeneration ([place]) 02/24/2016  . CHOLELITHIASIS 02/14/2009  . HYPERLIPIDEMIA 12/29/2008  . Essential hypertension 12/29/2008  . MYOCARDIAL INFARCTION, HX OF 12/29/2008  . CAD (coronary artery disease) 12/29/2008    Rexanne Mano, PT 04/09/2018, 2:50 PM  [facility] [address]  [place], [place], [address] Phone: [phone]   Fax:  [phone]  Name: Shelly Obrien MRN: [id number] Date of Birth: [birth year]

## 2018-04-10 ENCOUNTER — Encounter: Payer: Self-pay | Admitting: Physical Therapy

## 2018-04-10 ENCOUNTER — Ambulatory Visit: Payer: Medicare Other | Admitting: Physical Therapy

## 2018-04-10 DIAGNOSIS — R2681 Unsteadiness on feet: Secondary | ICD-10-CM | POA: Diagnosis not present

## 2018-04-10 DIAGNOSIS — R2689 Other abnormalities of gait and mobility: Secondary | ICD-10-CM | POA: Diagnosis not present

## 2018-04-10 DIAGNOSIS — R293 Abnormal posture: Secondary | ICD-10-CM | POA: Diagnosis not present

## 2018-04-10 NOTE — Therapy (Addendum)
[facility] [address] [place], [place], [address] Phone: [phone]   Fax:  [phone]  Physical Therapy Treatment and Discharge Summary  Patient Details  Name: Shelly Obrien MRN: [id number] Date of Birth: [birth year] Referring Provider (PT): [name], MD   Encounter Date: 04/10/2018  PT End of Session - 04/10/18 1448    Visit Number  11    Number of Visits  17    Date for PT Re-Evaluation  03/30/18    Authorization Type  Medicare A/B    Authorization Time Period  01/29/18 to 04/29/2018    PT Start Time  1445    PT Stop Time  1527    PT Time Calculation (min)  42 min    Activity Tolerance  Patient tolerated treatment well    Behavior During Therapy  [facility] for tasks assessed/performed       Past Medical History:  Diagnosis Date  . CAD S/P percutaneous coronary angioplasty    hx MI 04/ 1994  s/p  PCI to LAD  . CKD (chronic kidney disease), stage III ([place])   . Complication of anesthesia    hard to wake   . History of basal cell carcinoma (BCC) excision    right eye area 08/ 2013  . History of kidney stones 01/20/2017  . History of MI (myocardial infarction) 09/1992   s/p  PCI to LAD  . History of squamous cell carcinoma excision    right lower leg 2015 ;  2016  . Hyperlipidemia   . Hypertension   . Macular degeneration   . PAF (paroxysmal atrial fibrillation) ([place]) dx 02-24-2016   followed by cardiologist-- dr Mamie Nick. [name]  . PONV (postoperative nausea and vomiting)   . Right ureteral stone   . Wears glasses     Past Surgical History:  Procedure Laterality Date  . ABDOMINAL HYSTERECTOMY    . BREAST EXCISIONAL BIOPSY Left 11/02/2009   fibroadenoma (lumpectomy)  . BROW LIFT Right 04/07/2014   Procedure: REPAIR OF ENTROPION AND TRICHIASIS OF RIGHT LOWER EYE LID ;  Surgeon: Theodoro Kos, DO;  Location: [facility];  Service: Plastics;  Laterality: Right;  . CARDIOVERSION N/A 07/18/2017   Procedure: CARDIOVERSION;  Surgeon: Skeet Latch, MD;  Location: [facility];  Service: Cardiovascular;  Laterality: N/A;  . CATARACT EXTRACTION W/ INTRAOCULAR LENS  IMPLANT, BILATERAL    . CHOLECYSTECTOMY N/A 12/06/2017   Procedure: LAPAROSCOPIC CHOLECYSTECTOMY;  Surgeon: Jovita Kussmaul, MD;  [facility];  Service: General;  Laterality: N/A;  . CORONARY ANGIOPLASTY  04/ 1994  dr Lia Foyer   PCI to LAD  . CYSTOSCOPY W/ URETERAL STENT PLACEMENT Right 01/20/2017   Procedure: CYSTOSCOPY WITH RIGHT RETROGRADE PYELOGRAM/RIGHT URETERAL STENT PLACEMENT;  Surgeon: Alexis Frock, MD;  [facility];  Service: Urology;  Laterality: Right;  . CYSTOSCOPY WITH RETROGRADE PYELOGRAM, URETEROSCOPY AND STENT PLACEMENT Right 02/15/2017   Procedure: CYSTOSCOPY WITH RETROGRADE PYELOGRAM, URETEROSCOPY, STONE BASKETRY AND STENT REPLACEMENT;  Surgeon: Alexis Frock, MD;  Location: [facility];  Service: Urology;  Laterality: Right;  . CYSTOSCOPY/RETROGRADE/URETEROSCOPY/STONE EXTRACTION WITH BASKET  yrs ago  . DILATION AND CURETTAGE OF UTERUS    . HOLMIUM LASER APPLICATION Right 9/44/9675   Procedure: HOLMIUM LASER APPLICATION;  Surgeon: Alexis Frock, MD;  [facility];  Service: Urology;  Laterality: Right;  . KNEE SURGERY     right  . NEUROPLASTY / TRANSPOSITION ULNAR NERVE AT ELBOW Left 02-23-2000    [facility]  and Left Carpal Tunnel Release  . TRANSTHORACIC ECHOCARDIOGRAM  02/26/2016   moderate focal basal and mild concentric LVH,  ef 55-60%,  akinesis of the basal-midanteroseptal, inferoseptal and apicalinferior myocardium, study not sufficient to evaluation diastolic funciton due to atrial fib./  trivial PR/  mild TR    There were no vitals filed for this visit.  Subjective Assessment - 04/10/18 1447    Subjective  Doing well. Had a brief episode of dizziness when she looked up today.     Pertinent History  macular degeneration, CAD, HTN, PAF on Eliquis, CKD,  excision skin cancer under right eye    Patient Stated Goals  walk without the cane; be able to walk to the mailbox    Currently in Pain?  No/denies         [facility] PT Assessment - 04/10/18 1455      Ambulation/Gait   Gait velocity  32.8/9.72=3.37 ft/sec   1.18 ft/sec norm for age     Dynamic Gait Index   Level Surface  Mild Impairment   8.44 sec   Change in Gait Speed  Normal    Gait with Horizontal Head Turns  Mild Impairment    Gait with Vertical Head Turns  Mild Impairment    Gait and Pivot Turn  Normal    Step Over Obstacle  Normal    Step Around Obstacles  Normal    Steps  Mild Impairment    Total Score  20    DGI comment:  Scores of 19 or less are predictive of falls in older community living adults                   [facility] Adult PT Treatment/Exercise - 04/10/18 1455      Ambulation/Gait   Ambulation/Gait Assistance  6: Modified independent (Device/Increase time);5: Supervision    Ambulation/Gait Assistance Details  with SPC modified independent including with head turns; no device with supervision with slight imbalance noted with head turns    Ambulation Distance (Feet)  120 Feet   x 3   Assistive device  Straight cane;None    Gait Pattern  Step-through pattern;Right flexed knee in stance;Decreased trunk rotation;Trunk flexed    Ambulation Surface  Level;Indoor          Balance Exercises - 04/10/18 2102      Balance Exercises: Standing   Stepping Strategy  Anterior;Posterior;Foam/compliant surface;UE support;10 reps    Rockerboard  Anterior/posterior;Lateral;Head turns;EO;Intermittent UE support    Step Ups  Forward;Lateral;4 inch;Intermittent UE support    Gait with Head Turns  Forward;Upper extremity support    Tandem Gait  Forward;Upper extremity support;Foam/compliant surface;3 reps    Retro Gait  Upper extremity support;4 reps    Sidestepping  Foam/compliant support;Upper extremity support;4 reps        PT Education - 04/10/18 2055     Education Details  results of assessment; importance of continuing HEP to maintain gains    Person(s) Educated  Patient    Methods  Explanation    Comprehension  Verbalized understanding       PT Short Term Goals - 02/27/18 1519      PT SHORT TERM GOAL #1   Title  Assess gait velocity with cane with STG to be >1.81 ft/sec (indicative of lesser fall risk) (Target all STGs 02/28/2018)    Baseline  9/9 2.08 ft/sec    Time  4    Period  Weeks    Status  Achieved  PT SHORT TERM GOAL #2   Title  Patient will improve DGI score by 3 points ([place])    Baseline  17/24    Time  4    Period  Weeks    Status  Achieved      PT SHORT TERM GOAL #3   Title  Patient will be independent with HEP for balance and strengthening    Baseline  still needs cues to perform x 1 viewing correctly    Time  4    Period  Weeks    Status  Partially Met        PT Long Term Goals - 04/10/18 2055      PT LONG TERM GOAL #1   Title  independent with updated HEP (target all LTGs 03/30/2018---Due to 3 missed weeks of PT, target date extended to 04/11/18)    Time  8    Period  Weeks    Status  Achieved      PT LONG TERM GOAL #2   Title  Patient will ambulate 200 feet with LRAD modified independent on level surfaces with head turns over 40 ft of the entire distance    Time  8    Period  Weeks    Status  Achieved      PT LONG TERM GOAL #3   Title  improve DGI score to >=19/28 to demonstrate lesser fall risk    Baseline  04/10/18 20/24    Time  8    Period  Weeks    Status  Achieved      PT LONG TERM GOAL #4   Title  Patient will improve gait velocity to >=2.62 ft/sec for safe community ambulation    Baseline  04/10/18  3.37 ft/sec    Time  8    Period  Weeks    Status  Achieved            Plan - 04/10/18 1448    Clinical Impression Statement  LTGs assessed with pt meeting 4 of 4 goals. She is very pleased with her improvements in balance and walking. She notes she still has episodes of brief  dizziness which are better than before, but still present. She plans to follow up with her doctor re: the dizziness and her neck/LUE pain. Patient discharged from PT.     Clinical Impairments Affecting Rehab Potential  macular degeneration and limited cervical ROM can impact abiity to do vestibular rehab exercises; pt with bil LE sensory loss impacting ability to maintain balance    PT Frequency  2x / week    PT Duration  8 weeks    PT Treatment/Interventions  ADLs/Self Care Home Management;Gait training;DME Instruction;Functional mobility training;Therapeutic activities;Therapeutic exercise;Balance training;Neuromuscular re-education;Manual techniques;Patient/family education;Passive range of motion;Dry needling;Vestibular;Visual/perceptual remediation/compensation    PT Next Visit Plan  --    PT Home Exercise Plan  Access Code: [email or web address]    Consulted and Agree with Plan of Care  Patient       Patient will benefit from skilled therapeutic intervention in order to improve the following deficits and impairments:  Abnormal gait, Decreased activity tolerance, Decreased balance, Decreased mobility, Decreased knowledge of use of DME, Decreased knowledge of precautions, Decreased safety awareness, Decreased strength, Dizziness, Increased fascial restricitons, Increased muscle spasms, Impaired flexibility, Impaired sensation, Postural dysfunction, Impaired vision/preception  Visit Diagnosis: Unsteadiness on feet  Other abnormalities of gait and mobility     Problem List Patient Active Problem List   Diagnosis Date Noted  .  Constipation   . Acute on chronic cholecystitis s/p lap cholecystectomy 12/06/2017 12/04/2017  . Nephrolithiasis 12/04/2017  . Abdominal pain 12/04/2017  . Spinal stenosis in cervical region 04/24/2017  . Spinal stenosis of lumbar region 04/24/2017  . Dizziness 04/24/2017  . Ureteral stone with hydronephrosis 01/20/2017  . Atrial fibrillation with RVR ([place]) 02/24/2016  . CKD  (chronic kidney disease) stage 3, GFR 30-59 ml/min (HCC) 02/24/2016  . Paroxysmal atrial fibrillation ([place]) 02/24/2016  . Exudative macular degeneration ([place]) 02/24/2016  . CHOLELITHIASIS 02/14/2009  . HYPERLIPIDEMIA 12/29/2008  . Essential hypertension 12/29/2008  . MYOCARDIAL INFARCTION, HX OF 12/29/2008  . CAD (coronary artery disease) 12/29/2008   PHYSICAL THERAPY DISCHARGE SUMMARY  Visits from Start of Care: 11  Current functional level related to goals / functional outcomes: See LTGs   Remaining deficits: Mild dizziness and imbalance without cane   Education / Equipment: HEP, safe use of SPC  Plan: Patient agrees to discharge.  Patient goals were met. Patient is being discharged due to meeting the stated rehab goals.  ?????       Rexanne Mano, PT 04/10/2018, 9:04 PM  [facility] [address] [place], [place], [address] Phone: [phone]   Fax:  [phone]  Name: NADEEN SHIPMAN MRN: [id number] Date of Birth: [birth year]

## 2018-04-21 ENCOUNTER — Ambulatory Visit (INDEPENDENT_AMBULATORY_CARE_PROVIDER_SITE_OTHER): Payer: Medicare Other | Admitting: Cardiovascular Disease

## 2018-04-21 ENCOUNTER — Encounter: Payer: Self-pay | Admitting: Cardiovascular Disease

## 2018-04-21 VITALS — BP 132/82 | HR 82 | Ht 66.0 in | Wt 161.0 lb

## 2018-04-21 DIAGNOSIS — I2510 Atherosclerotic heart disease of native coronary artery without angina pectoris: Secondary | ICD-10-CM | POA: Diagnosis not present

## 2018-04-21 DIAGNOSIS — I480 Paroxysmal atrial fibrillation: Secondary | ICD-10-CM | POA: Diagnosis not present

## 2018-04-21 NOTE — Progress Notes (Signed)
Cardiology Office Note:    Date:  04/21/2018   ID:  Shelly Obrien, DOB 1922-07-31, MRN [id number]  PCP:  [name], MD  Cardiologist:  Dorris Carnes, MD  Electrophysiologist:  None   Referring MD: [name], MD   Chief Complaint  Patient presents with  . Atrial Fibrillation  . Coronary Artery Disease     Nov. 18, 2019   Shelly Obrien is a 84 y.o. female with a hx of coronary artery disease and atrial fibrillation. She is seen for the first time today.  Transfer r from Dr. [name].  Had a cardioversion several months ago - seems to be better . Has been seen in the Afib clinic ,  Is on Eliquis 5  BID  Has had some issues with balance.   Lives at home by herself - townhouse  Hx of CAD in the past Had PTCA - years ago    Past Medical History:  Diagnosis Date  . CAD S/P percutaneous coronary angioplasty    hx MI 04/ 1994  s/p  PCI to LAD  . CKD (chronic kidney disease), stage III ([place])   . Complication of anesthesia    hard to wake   . History of basal cell carcinoma (BCC) excision    right eye area 08/ 2013  . History of kidney stones 01/20/2017  . History of MI (myocardial infarction) 09/1992   s/p  PCI to LAD  . History of squamous cell carcinoma excision    right lower leg 2015 ;  2016  . Hyperlipidemia   . Hypertension   . Macular degeneration   . PAF (paroxysmal atrial fibrillation) ([place]) dx 02-24-2016   followed by cardiologist-- dr Mamie Nick. [name]  . PONV (postoperative nausea and vomiting)   . Right ureteral stone   . Wears glasses     Past Surgical History:  Procedure Laterality Date  . ABDOMINAL HYSTERECTOMY    . BREAST EXCISIONAL BIOPSY Left 11/02/2009   fibroadenoma (lumpectomy)  . BROW LIFT Right 04/07/2014   Procedure: REPAIR OF ENTROPION AND TRICHIASIS OF RIGHT LOWER EYE LID ;  Surgeon: Theodoro Kos, DO;  Location: [facility];  Service: Plastics;  Laterality: Right;  . CARDIOVERSION N/A 07/18/2017   Procedure: CARDIOVERSION;  Surgeon:  Skeet Latch, MD;  Location: [facility];  Service: Cardiovascular;  Laterality: N/A;  . CATARACT EXTRACTION W/ INTRAOCULAR LENS  IMPLANT, BILATERAL    . CHOLECYSTECTOMY N/A 12/06/2017   Procedure: LAPAROSCOPIC CHOLECYSTECTOMY;  Surgeon: Jovita Kussmaul, MD;  [facility];  Service: General;  Laterality: N/A;  . CORONARY ANGIOPLASTY  04/ 1994  dr Lia Foyer   PCI to LAD  . CYSTOSCOPY W/ URETERAL STENT PLACEMENT Right 01/20/2017   Procedure: CYSTOSCOPY WITH RIGHT RETROGRADE PYELOGRAM/RIGHT URETERAL STENT PLACEMENT;  Surgeon: Alexis Frock, MD;  [facility];  Service: Urology;  Laterality: Right;  . CYSTOSCOPY WITH RETROGRADE PYELOGRAM, URETEROSCOPY AND STENT PLACEMENT Right 02/15/2017   Procedure: CYSTOSCOPY WITH RETROGRADE PYELOGRAM, URETEROSCOPY, STONE BASKETRY AND STENT REPLACEMENT;  Surgeon: Alexis Frock, MD;  Location: [facility];  Service: Urology;  Laterality: Right;  . CYSTOSCOPY/RETROGRADE/URETEROSCOPY/STONE EXTRACTION WITH BASKET  yrs ago  . DILATION AND CURETTAGE OF UTERUS    . HOLMIUM LASER APPLICATION Right 0/96/2836   Procedure: HOLMIUM LASER APPLICATION;  Surgeon: Alexis Frock, MD;  Location[facility];  Service: Urology;  Laterality: Right;  . KNEE SURGERY     right  . NEUROPLASTY / TRANSPOSITION ULNAR NERVE AT ELBOW Left  02-23-2000    [facility]   and Left Carpal Tunnel Release  . TRANSTHORACIC ECHOCARDIOGRAM  02/26/2016   moderate focal basal and mild concentric LVH,  ef 55-60%,  akinesis of the basal-midanteroseptal, inferoseptal and apicalinferior myocardium, study not sufficient to evaluation diastolic funciton due to atrial fib./  trivial PR/  mild TR    Current Medications: Current Meds  Medication Sig  . acetaminophen (TYLENOL) 325 MG tablet Take 325 mg by mouth daily as needed for mild pain.   [name] diltiazem (CARDIZEM CD) 120 MG 24 hr capsule TAKE ONE CAPSULE BY MOUTH TWICE DAILY  . ELIQUIS 5 MG TABS tablet TAKE 1 TABLET BY  MOUTH TWICE DAILY  . Multiple Vitamins-Minerals (ICAPS AREDS 2) CAPS Take 2 capsules by mouth daily.   Vladimir Faster Glycol-Propyl Glycol (SYSTANE OP) Place 1 drop into both eyes daily as needed (itchy eyes).  . triamcinolone (NASACORT ALLERGY 24HR) 55 MCG/ACT AERO nasal inhaler Place 1-2 sprays into the nose 2 (two) times daily as needed (for rhinitis).   . valACYclovir (VALTREX) 1000 MG tablet Take 1 tablet (1,000 mg total) by mouth 3 (three) times daily.     Allergies:   Antihistamines, chlorpheniramine-type; Penicillins; Contrast media [iodinated diagnostic agents]; Sulfonamide derivatives; Atorvastatin; and Pheniramine   Social History   Socioeconomic History  . Marital status: Widowed    Spouse name: Not on file  . Number of children: 2  . Years of education: Not on file  . Highest education level: Not on file  Occupational History  . Occupation: Retired  Scientific laboratory technician  . Financial resource strain: Not on file  . Food insecurity:    Worry: Not on file    Inability: Not on file  . Transportation needs:    Medical: Not on file    Non-medical: Not on file  Tobacco Use  . Smoking status: Never Smoker  . Smokeless tobacco: Never Used  Substance and Sexual Activity  . Alcohol use: No    Alcohol/week: 0.0 standard drinks  . Drug use: No  . Sexual activity: Not on file  Lifestyle  . Physical activity:    Days per week: Not on file    Minutes per session: Not on file  . Stress: Not on file  Relationships  . Social connections:    Talks on phone: Not on file    Gets together: Not on file    Attends religious service: Not on file    Active member of club or organization: Not on file    Attends meetings of clubs or organizations: Not on file    Relationship status: Not on file  Other Topics Concern  . Not on file  Social History Narrative   Lives at home     Family History: The patient's family history includes Brain cancer in her son; Cancer in her unknown relative;  Coronary artery disease in her unknown relative; Liver cancer in her daughter.  ROS:   Please see the history of present illness.    All other systems reviewed and are negative.  EKGs/Labs/Other Studies Reviewed:       EKG:    Recent Labs: 06/26/2017: B Natriuretic Peptide 644.9; Magnesium 2.1; TSH 0.968 03/09/2018: ALT 14; BUN 21; Creatinine, Ser 1.25; Hemoglobin 13.1; Platelets 200; Potassium 3.9; Sodium 136  Recent Lipid Panel No results found for: CHOL, TRIG, HDL, CHOLHDL, VLDL, LDLCALC, LDLDIRECT  Physical Exam:    VS:  BP 132/82   Pulse 82   Ht 5\' 6"  (1.676 m)  Wt 161 lb (73 kg)   SpO2 97%   BMI 25.99 kg/m     Wt Readings from Last 3 Encounters:  04/21/18 161 lb (73 kg)  12/15/17 158 lb 4.6 oz (71.8 kg)  12/04/17 163 lb 2.3 oz (74 kg)     GEN:  Well nourished, well developed in no acute distress HEENT: Normal NECK: No JVD; No carotid bruits LYMPHATICS: No lymphadenopathy CARDIAC: RRR,   Soft murmur  RESPIRATORY:  Clear to auscultation without rales, wheezing or rhonchi  ABDOMEN: Soft, non-tender, non-distended MUSCULOSKELETAL:  No edema; No deformity  SKIN: Warm and dry NEUROLOGIC:  Alert and oriented x 3 PSYCHIATRIC:  Normal affect   ASSESSMENT:    1. Coronary artery disease involving native coronary artery of native heart without angina pectoris   2. PAF (paroxysmal atrial fibrillation) ([place])    PLAN:      1.   CAD - s/p PTCA many years ago.  No angina.   Will check lipids in several weeks.  2.  Atrial fib:   Feels better after cardioversion .  Continue Eliquis 5 mg p.o. twice daily.   Medication Adjustments/Labs and Tests Ordered: Current medicines are reviewed at length with the patient today.  Concerns regarding medicines are outlined above.  Orders Placed This Encounter  Procedures  . Lipid Profile  . Basic Metabolic Panel (BMET)  . Hepatic function panel   No orders of the defined types were placed in this encounter.   Patient  Instructions  Medication Instructions:  Your physician recommends that you continue on your current medications as directed. Please refer to the Current Medication list given to you today.  If you need a refill on your cardiac medications before your next appointment, please call your pharmacy.   Lab work: Your physician recommends that you return for lab work in: 3 weeks on Wednesday Dec. 4 You may come in anytime between 7:30 am and 4:30 pm You will need to FAST for this appointment - nothing to eat or drink after midnight the night before except water or black coffee   Testing/Procedures: None Ordered   Follow-Up: At [facility], you and your health needs are our priority.  As part of our continuing mission to provide you with exceptional heart care, we have created designated Provider Care Teams.  These Care Teams include your primary Cardiologist (physician) and Advanced Practice Providers (APPs -  Physician Assistants and Nurse Practitioners) who all work together to provide you with the care you need, when you need it. You will need a follow up appointment in:  6 months.  Please call our office 2 months in advance to schedule this appointment.  You may see Dr. Mertie Moores or one of the following Advanced Practice Providers on your designated Care Team: Richardson Dopp, PA-C [place], [place] . Daune Perch, NP       Signed, Mertie Moores, MD  04/21/2018 5:14 PM    [facility]

## 2018-04-21 NOTE — Patient Instructions (Signed)
Medication Instructions:  Your physician recommends that you continue on your current medications as directed. Please refer to the Current Medication list given to you today.  If you need a refill on your cardiac medications before your next appointment, please call your pharmacy.   Lab work: Your physician recommends that you return for lab work in: 3 weeks on Wednesday Dec. 4 You may come in anytime between 7:30 am and 4:30 pm You will need to FAST for this appointment - nothing to eat or drink after midnight the night before except water or black coffee   Testing/Procedures: None Ordered   Follow-Up: At [facility], you and your health needs are our priority.  As part of our continuing mission to provide you with exceptional heart care, we have created designated Provider Care Teams.  These Care Teams include your primary Cardiologist (physician) and Advanced Practice Providers (APPs -  Physician Assistants and Nurse Practitioners) who all work together to provide you with the care you need, when you need it. You will need a follow up appointment in:  6 months.  Please call our office 2 months in advance to schedule this appointment.  You may see Dr. Mertie Moores or one of the following Advanced Practice Providers on your designated Care Team: Richardson Dopp, PA-C [place], [place] . Daune Perch, NP

## 2018-04-24 DIAGNOSIS — H353231 Exudative age-related macular degeneration, bilateral, with active choroidal neovascularization: Secondary | ICD-10-CM | POA: Diagnosis not present

## 2018-04-24 DIAGNOSIS — H35721 Serous detachment of retinal pigment epithelium, right eye: Secondary | ICD-10-CM | POA: Diagnosis not present

## 2018-04-25 ENCOUNTER — Encounter (HOSPITAL_COMMUNITY): Payer: Self-pay

## 2018-04-25 ENCOUNTER — Ambulatory Visit (HOSPITAL_COMMUNITY)
Admission: EM | Admit: 2018-04-25 | Discharge: 2018-04-25 | Disposition: A | Discharge status: Home / Self Care | Payer: Medicare Other | Attending: Family Medicine | Admitting: Family Medicine

## 2018-04-25 DIAGNOSIS — Z7901 Long term (current) use of anticoagulants: Secondary | ICD-10-CM | POA: Diagnosis not present

## 2018-04-25 DIAGNOSIS — Z91041 Radiographic dye allergy status: Secondary | ICD-10-CM | POA: Insufficient documentation

## 2018-04-25 DIAGNOSIS — E785 Hyperlipidemia, unspecified: Secondary | ICD-10-CM | POA: Insufficient documentation

## 2018-04-25 DIAGNOSIS — Z9861 Coronary angioplasty status: Secondary | ICD-10-CM | POA: Insufficient documentation

## 2018-04-25 DIAGNOSIS — Z9841 Cataract extraction status, right eye: Secondary | ICD-10-CM | POA: Diagnosis not present

## 2018-04-25 DIAGNOSIS — M546 Pain in thoracic spine: Secondary | ICD-10-CM | POA: Diagnosis not present

## 2018-04-25 DIAGNOSIS — N183 Chronic kidney disease, stage 3 (moderate): Secondary | ICD-10-CM | POA: Insufficient documentation

## 2018-04-25 DIAGNOSIS — Z9842 Cataract extraction status, left eye: Secondary | ICD-10-CM | POA: Insufficient documentation

## 2018-04-25 DIAGNOSIS — I2510 Atherosclerotic heart disease of native coronary artery without angina pectoris: Secondary | ICD-10-CM | POA: Diagnosis not present

## 2018-04-25 DIAGNOSIS — Z880 Allergy status to penicillin: Secondary | ICD-10-CM | POA: Diagnosis not present

## 2018-04-25 DIAGNOSIS — I252 Old myocardial infarction: Secondary | ICD-10-CM | POA: Insufficient documentation

## 2018-04-25 DIAGNOSIS — Z79899 Other long term (current) drug therapy: Secondary | ICD-10-CM | POA: Insufficient documentation

## 2018-04-25 DIAGNOSIS — Z9049 Acquired absence of other specified parts of digestive tract: Secondary | ICD-10-CM | POA: Diagnosis not present

## 2018-04-25 DIAGNOSIS — I480 Paroxysmal atrial fibrillation: Secondary | ICD-10-CM | POA: Insufficient documentation

## 2018-04-25 DIAGNOSIS — Z961 Presence of intraocular lens: Secondary | ICD-10-CM | POA: Insufficient documentation

## 2018-04-25 DIAGNOSIS — Z888 Allergy status to other drugs, medicaments and biological substances status: Secondary | ICD-10-CM | POA: Insufficient documentation

## 2018-04-25 DIAGNOSIS — Z882 Allergy status to sulfonamides status: Secondary | ICD-10-CM | POA: Diagnosis not present

## 2018-04-25 DIAGNOSIS — R21 Rash and other nonspecific skin eruption: Secondary | ICD-10-CM | POA: Diagnosis present

## 2018-04-25 DIAGNOSIS — Z87442 Personal history of urinary calculi: Secondary | ICD-10-CM | POA: Insufficient documentation

## 2018-04-25 DIAGNOSIS — Z90710 Acquired absence of both cervix and uterus: Secondary | ICD-10-CM | POA: Diagnosis not present

## 2018-04-25 DIAGNOSIS — Z85828 Personal history of other malignant neoplasm of skin: Secondary | ICD-10-CM | POA: Insufficient documentation

## 2018-04-25 DIAGNOSIS — I129 Hypertensive chronic kidney disease with stage 1 through stage 4 chronic kidney disease, or unspecified chronic kidney disease: Secondary | ICD-10-CM | POA: Diagnosis not present

## 2018-04-25 LAB — POCT URINALYSIS DIP (DEVICE)
Bilirubin Urine: NEGATIVE
Glucose, UA: NEGATIVE mg/dL
Hgb urine dipstick: NEGATIVE
Ketones, ur: NEGATIVE mg/dL
Nitrite: NEGATIVE
Protein, ur: 30 mg/dL — AB
Specific Gravity, Urine: 1.02 (ref 1.005–1.030)
Urobilinogen, UA: 0.2 mg/dL (ref 0.0–1.0)
pH: 6.5 (ref 5.0–8.0)

## 2018-04-25 MED ORDER — DICLOFENAC SODIUM 1 % TD GEL: 2.0000 g | Freq: Four times a day (QID) | TRANSDERMAL | 0 refills | Status: DC

## 2018-04-25 MED ORDER — LIDOCAINE 5 % EX PTCH: 1.0000 | MEDICATED_PATCH | CUTANEOUS | 0 refills | Status: DC

## 2018-04-25 MED ORDER — TRAMADOL HCL 50 MG PO TABS: 50.0000 mg | ORAL_TABLET | Freq: Four times a day (QID) | ORAL | 0 refills | Status: DC | PRN

## 2018-04-25 NOTE — ED Triage Notes (Signed)
Pt presents with complaints of back pain and rash on her back that is painful x 3 days.

## 2018-04-25 NOTE — Discharge Instructions (Signed)
I am giving you a few different things to help with the pain Voltaren gel to rub on the back. I am also giving you Lidoderm patch.  Make sure that the Voltaren gel is completely dry and rubbed them before you apply the patch. Tramadol as needed at nighttime to help sleep Monitor the area on your back for worsening or spreading

## 2018-04-26 LAB — URINE CULTURE: Culture: 60000 — AB

## 2018-04-27 NOTE — ED Provider Notes (Signed)
[name]    CSN: [id number] Arrival date & time: 04/25/18  7425     History   Chief Complaint Chief Complaint  Patient presents with  . Rash  . Back Pain    HPI Shelly Obrien is a 84 y.o. female.   Pt is a 84 year old female that presents with rash to right thorac area that has been there 3 days. She reports pain in the back. The pain is bilateral thoracic paravertebral musculature. Her symptoms have been constant. She denies any radicular symptoms. describes the pain and sharp at times and aching. No injury. She has been using a heating pad. She has been taking tylenol for the pain with minimal relief. She was dx and treated fir shingles on 03/09/18. The rash ans pain from that has mostly resolved.   ROS per HPI    Rash  Back Pain    Past Medical History:  Diagnosis Date  . CAD S/P percutaneous coronary angioplasty    hx MI 04/ 1994  s/p  PCI to LAD  . CKD (chronic kidney disease), stage III ([place])   . Complication of anesthesia    hard to wake   . History of basal cell carcinoma (BCC) excision    right eye area 08/ 2013  . History of kidney stones 01/20/2017  . History of MI (myocardial infarction) 09/1992   s/p  PCI to LAD  . History of squamous cell carcinoma excision    right lower leg 2015 ;  2016  . Hyperlipidemia   . Hypertension   . Macular degeneration   . PAF (paroxysmal atrial fibrillation) ([place]) dx 02-24-2016   followed by cardiologist-- dr Mamie Nick. [name]  . PONV (postoperative nausea and vomiting)   . Right ureteral stone   . Wears glasses     Patient Active Problem List   Diagnosis Date Noted  . Constipation   . Acute on chronic cholecystitis s/p lap cholecystectomy 12/06/2017 12/04/2017  . Nephrolithiasis 12/04/2017  . Abdominal pain 12/04/2017  . Spinal stenosis in cervical region 04/24/2017  . Spinal stenosis of lumbar region 04/24/2017  . Dizziness 04/24/2017  . Ureteral stone with hydronephrosis 01/20/2017  . Atrial fibrillation  with RVR ([place]) 02/24/2016  . CKD (chronic kidney disease) stage 3, GFR 30-59 ml/min (HCC) 02/24/2016  . Paroxysmal atrial fibrillation ([place]) 02/24/2016  . Exudative macular degeneration ([place]) 02/24/2016  . CHOLELITHIASIS 02/14/2009  . HYPERLIPIDEMIA 12/29/2008  . Essential hypertension 12/29/2008  . MYOCARDIAL INFARCTION, HX OF 12/29/2008  . CAD (coronary artery disease) 12/29/2008    Past Surgical History:  Procedure Laterality Date  . ABDOMINAL HYSTERECTOMY    . BREAST EXCISIONAL BIOPSY Left 11/02/2009   fibroadenoma (lumpectomy)  . BROW LIFT Right 04/07/2014   Procedure: REPAIR OF ENTROPION AND TRICHIASIS OF RIGHT LOWER EYE LID ;  Surgeon: Theodoro Kos, DO;  Location: [facility];  Service: Plastics;  Laterality: Right;  . CARDIOVERSION N/A 07/18/2017   Procedure: CARDIOVERSION;  Surgeon: Skeet Latch, MD;  Location: [facility];  Service: Cardiovascular;  Laterality: N/A;  . CATARACT EXTRACTION W/ INTRAOCULAR LENS  IMPLANT, BILATERAL    . CHOLECYSTECTOMY N/A 12/06/2017   Procedure: LAPAROSCOPIC CHOLECYSTECTOMY;  Surgeon: Jovita Kussmaul, MD;  Location[facility];  Service: General;  Laterality: N/A;  . CORONARY ANGIOPLASTY  04/ 1994  dr Lia Foyer   PCI to LAD  . CYSTOSCOPY W/ URETERAL STENT PLACEMENT Right 01/20/2017   Procedure: CYSTOSCOPY WITH RIGHT RETROGRADE PYELOGRAM/RIGHT URETERAL STENT PLACEMENT;  Surgeon:  Alexis Frock, MD;  [facility];  Service: Urology;  Laterality: Right;  . CYSTOSCOPY WITH RETROGRADE PYELOGRAM, URETEROSCOPY AND STENT PLACEMENT Right 02/15/2017   Procedure: CYSTOSCOPY WITH RETROGRADE PYELOGRAM, URETEROSCOPY, STONE BASKETRY AND STENT REPLACEMENT;  Surgeon: Alexis Frock, MD;  Location: [facility];  Service: Urology;  Laterality: Right;  . CYSTOSCOPY/RETROGRADE/URETEROSCOPY/STONE EXTRACTION WITH BASKET  yrs ago  . DILATION AND CURETTAGE OF UTERUS    . HOLMIUM LASER APPLICATION Right 0/96/2836   Procedure: HOLMIUM LASER  APPLICATION;  Surgeon: Alexis Frock, MD;  Location: [facility];  Service: Urology;  Laterality: Right;  . KNEE SURGERY     right  . NEUROPLASTY / TRANSPOSITION ULNAR NERVE AT ELBOW Left 02-23-2000    [facility]   and Left Carpal Tunnel Release  . TRANSTHORACIC ECHOCARDIOGRAM  02/26/2016   moderate focal basal and mild concentric LVH,  ef 55-60%,  akinesis of the basal-midanteroseptal, inferoseptal and apicalinferior myocardium, study not sufficient to evaluation diastolic funciton due to atrial fib./  trivial PR/  mild TR    OB History   None      Home Medications    Prior to Admission medications   Medication Sig Start Date End Date Taking? Authorizing Provider  acetaminophen (TYLENOL) 325 MG tablet Take 325 mg by mouth daily as needed for mild pain.     [provider]  diclofenac sodium (VOLTAREN) 1 % GEL Apply 2 g topically 4 (four) times daily. 04/25/18   Loura Halt A, NP  diltiazem (CARDIZEM CD) 120 MG 24 hr capsule TAKE ONE CAPSULE BY MOUTH TWICE DAILY 10/02/17   Sherran Needs, NP  ELIQUIS 5 MG TABS tablet TAKE 1 TABLET BY MOUTH TWICE DAILY 11/27/17   Sherran Needs, NP  lidocaine (LIDODERM) 5 % Place 1 patch onto the skin daily. Remove & Discard patch within 12 hours or as directed by MD 04/25/18   Loura Halt A, NP  Multiple Vitamins-Minerals (ICAPS AREDS 2) CAPS Take 2 capsules by mouth daily.     [provider]  Polyethyl Glycol-Propyl Glycol (SYSTANE OP) Place 1 drop into both eyes daily as needed (itchy eyes).    [provider]  traMADol (ULTRAM) 50 MG tablet Take 1 tablet (50 mg total) by mouth every 6 (six) hours as needed. 04/25/18   Loura Halt A, NP  triamcinolone (NASACORT ALLERGY 24HR) 55 MCG/ACT AERO nasal inhaler Place 1-2 sprays into the nose 2 (two) times daily as needed (for rhinitis).     [provider]  valACYclovir (VALTREX) 1000 MG tablet Take 1 tablet (1,000 mg total) by mouth 3 (three) times daily.  03/09/18   Margarita Mail, PA-C    Family History Family History  Problem Relation Age of Onset  . Coronary artery disease Unknown        positive family hx of  . Cancer Unknown        family hx of  . Brain cancer Son   . Liver cancer Daughter     Social History Social History   Tobacco Use  . Smoking status: Never Smoker  . Smokeless tobacco: Never Used  Substance Use Topics  . Alcohol use: No    Alcohol/week: 0.0 standard drinks  . Drug use: No     Allergies   Antihistamines, chlorpheniramine-type; Penicillins; Contrast media [iodinated diagnostic agents]; Sulfonamide derivatives; Atorvastatin; and Pheniramine   Review of Systems Review of Systems  Musculoskeletal: Positive for back pain.  Skin: Positive for rash.  Physical Exam Triage Vital Signs ED Triage Vitals  Enc Vitals Group     BP 04/25/18 0853 (!) 151/84     Pulse Rate 04/25/18 0853 80     Resp 04/25/18 0853 16     Temp 04/25/18 0853 (!) 97.4 F (36.3 C)     Temp Source 04/25/18 0853 Oral     SpO2 04/25/18 0853 99 %     Weight --      Height --      Head Circumference --      Peak Flow --      Pain Score 04/25/18 0856 5     Pain Loc --      Pain Edu? --      Excl. in [place]? --    No data found.  Updated Vital Signs BP (!) 151/84 (BP Location: Left Arm)   Pulse 80   Temp (!) 97.4 F (36.3 C) (Oral)   Resp 16   SpO2 99%   Visual Acuity Right Eye Distance:   Left Eye Distance:   Bilateral Distance:    Right Eye Near:   Left Eye Near:    Bilateral Near:     Physical Exam  Constitutional: She appears well-developed and well-nourished.  HENT:  Head: Normocephalic.  Neck: Normal range of motion.  Pulmonary/Chest: Effort normal.  Musculoskeletal: Normal range of motion.  Tenderness to bilateral paravertebral muscles of the thoracic spine.   Neurological: She is alert.  Skin: Skin is warm and dry. Rash noted.  Macular rash to right thoracic spine.   Psychiatric: She has a  normal mood and affect.  Nursing note and vitals reviewed.    UC Treatments / Results  Labs (all labs ordered are listed, but only abnormal results are displayed) Labs Reviewed  URINE CULTURE - Abnormal; Notable for the following components:      Result Value   Culture   (*)    Value: 60,000 COLONIES/mL GROUP B STREP(S.AGALACTIAE)ISOLATED TESTING AGAINST S. AGALACTIAE NOT ROUTINELY PERFORMED DUE TO PREDICTABILITY OF AMP/PEN/VAN SUSCEPTIBILITY. Performed at [facility], [place] [address], [place], [place] [address]    All other components within normal limits  POCT URINALYSIS DIP (DEVICE) - Abnormal; Notable for the following components:   Protein, ur 30 (*)    Leukocytes, UA TRACE (*)    All other components within normal limits    EKG None  Radiology No results found.  Procedures Procedures (including critical care time)  Medications Ordered in UC Medications - No data to display  Initial Impression / Assessment and Plan / UC Course  I have reviewed the triage vital signs and the nursing notes.  Pertinent labs & imaging results that were available during my care of the patient were reviewed by me and considered in my medical decision making (see chart for details).     Rash does not appear to be herpes zoster. Pain appears to be muscle related.  Voltaren gel and lidocaine patch for pain tramadol for worsening pain.  Follow up as needed for continued or worsening symptoms  Final Clinical Impressions(s) / UC Diagnoses   Final diagnoses:  Acute left-sided thoracic back pain     Discharge Instructions     I am giving you a few different things to help with the pain Voltaren gel to rub on the back. I am also giving you Lidoderm patch.  Make sure that the Voltaren gel is completely dry and rubbed them before you apply the patch. Tramadol  as needed at nighttime to help sleep Monitor the area on your back for worsening or spreading     Controlled  Substance Prescriptions [place] Controlled Substance Registry consulted? no   Orvan July, NP 04/27/18 1212

## 2018-04-29 DIAGNOSIS — H353211 Exudative age-related macular degeneration, right eye, with active choroidal neovascularization: Secondary | ICD-10-CM | POA: Diagnosis not present

## 2018-05-07 ENCOUNTER — Other Ambulatory Visit: Payer: Medicare Other | Admitting: *Deleted

## 2018-05-07 DIAGNOSIS — I2510 Atherosclerotic heart disease of native coronary artery without angina pectoris: Secondary | ICD-10-CM

## 2018-05-07 DIAGNOSIS — I480 Paroxysmal atrial fibrillation: Secondary | ICD-10-CM

## 2018-05-07 LAB — LIPID PANEL
Chol/HDL Ratio: 2 ratio (ref 0.0–4.4)
Cholesterol, Total: 166 mg/dL (ref 100–199)
HDL: 81 mg/dL (ref >39)
LDL Calculated: 68 mg/dL (ref 0–99)
Triglycerides: 87 mg/dL (ref 0–149)
VLDL Cholesterol Cal: 17 mg/dL (ref 5–40)

## 2018-05-07 LAB — BASIC METABOLIC PANEL
BUN/Creatinine Ratio: 13 (ref 12–28)
BUN: 15 mg/dL (ref 10–36)
CO2: 21 mmol/L (ref 20–29)
Calcium: 10 mg/dL (ref 8.7–10.3)
Chloride: 105 mmol/L (ref 96–106)
Creatinine, Ser: 1.16 mg/dL — ABNORMAL HIGH (ref 0.57–1.00)
GFR calc Af Amer: 47 mL/min/{1.73_m2} — ABNORMAL LOW (ref >59)
GFR calc non Af Amer: 41 mL/min/{1.73_m2} — ABNORMAL LOW (ref >59)
Glucose: 101 mg/dL — ABNORMAL HIGH (ref 65–99)
Potassium: 4.8 mmol/L (ref 3.5–5.2)
Sodium: 142 mmol/L (ref 134–144)

## 2018-05-07 LAB — HEPATIC FUNCTION PANEL
ALT: 13 IU/L (ref 0–32)
AST: 17 IU/L (ref 0–40)
Albumin: 4.4 g/dL (ref 3.2–4.6)
Alkaline Phosphatase: 138 IU/L — ABNORMAL HIGH (ref 39–117)
Bilirubin Total: 0.7 mg/dL (ref 0.0–1.2)
Bilirubin, Direct: 0.19 mg/dL (ref 0.00–0.40)
Total Protein: 6.7 g/dL (ref 6.0–8.5)

## 2018-05-08 DIAGNOSIS — G90512 Complex regional pain syndrome I of left upper limb: Secondary | ICD-10-CM | POA: Diagnosis not present

## 2018-05-09 ENCOUNTER — Encounter (HOSPITAL_COMMUNITY): Payer: Self-pay | Admitting: Emergency Medicine

## 2018-05-09 ENCOUNTER — Ambulatory Visit (HOSPITAL_COMMUNITY)
Admission: EM | Admit: 2018-05-09 | Discharge: 2018-05-09 | Disposition: A | Discharge status: Home / Self Care | Payer: Medicare Other | Attending: Family Medicine | Admitting: Family Medicine

## 2018-05-09 ENCOUNTER — Other Ambulatory Visit (HOSPITAL_COMMUNITY): Payer: Self-pay | Admitting: Nurse Practitioner

## 2018-05-09 DIAGNOSIS — R05 Cough: Secondary | ICD-10-CM | POA: Diagnosis not present

## 2018-05-09 DIAGNOSIS — R059 Cough, unspecified: Secondary | ICD-10-CM

## 2018-05-09 DIAGNOSIS — J22 Unspecified acute lower respiratory infection: Secondary | ICD-10-CM | POA: Diagnosis not present

## 2018-05-09 MED ORDER — AZITHROMYCIN 250 MG PO TABS: 250.0000 mg | ORAL_TABLET | Freq: Every day | ORAL | 0 refills | Status: DC

## 2018-05-09 NOTE — ED Triage Notes (Signed)
Pt here for URI sx x 2 days  

## 2018-05-09 NOTE — ED Provider Notes (Signed)
[place]    CSN: [id number] Arrival date & time: 05/09/18  1034     History   Chief Complaint Chief Complaint  Patient presents with  . URI    HPI Shelly Obrien is a 84 y.o. female.   HPI  Patient is here for upper respiratory infection.'s been going on for 2 to 3 days.  She states she is "miserable".  She has a headache, head congestion, runny and stuffy nose, sinus pressure, sinus pain, postnasal drip.  She has a cough.  She feels mildly short of breath.  She states she could not sleep last night.  No chest pain or shortness of breath.  She does have underlying allergies.  She is not wheezing.  She is not short of breath.  She feels very tired.  She does not know if she has had a fever although she has felt "chilled". She called her family doctor and he is out of town.  She states that he "always" we will give her an antibiotic when she has an upper respiratory infection because of her "frailty".  She is 89, but appears pretty vigorous.  She is up-to-date with her flu and pneumonia shots.  Past Medical History:  Diagnosis Date  . CAD S/P percutaneous coronary angioplasty    hx MI 04/ 1994  s/p  PCI to LAD  . CKD (chronic kidney disease), stage III ([place])   . Complication of anesthesia    hard to wake   . History of basal cell carcinoma (BCC) excision    right eye area 08/ 2013  . History of kidney stones 01/20/2017  . History of MI (myocardial infarction) 09/1992   s/p  PCI to LAD  . History of squamous cell carcinoma excision    right lower leg 2015 ;  2016  . Hyperlipidemia   . Hypertension   . Macular degeneration   . PAF (paroxysmal atrial fibrillation) ([place]) dx 02-24-2016   followed by cardiologist-- dr Mamie Nick. [name]  . PONV (postoperative nausea and vomiting)   . Right ureteral stone   . Wears glasses     Patient Active Problem List   Diagnosis Date Noted  . Constipation   . Acute on chronic cholecystitis s/p lap cholecystectomy 12/06/2017 12/04/2017  .  Nephrolithiasis 12/04/2017  . Abdominal pain 12/04/2017  . Spinal stenosis in cervical region 04/24/2017  . Spinal stenosis of lumbar region 04/24/2017  . Dizziness 04/24/2017  . Ureteral stone with hydronephrosis 01/20/2017  . Atrial fibrillation with RVR ([place]) 02/24/2016  . CKD (chronic kidney disease) stage 3, GFR 30-59 ml/min (HCC) 02/24/2016  . Paroxysmal atrial fibrillation ([place]) 02/24/2016  . Exudative macular degeneration ([place]) 02/24/2016  . CHOLELITHIASIS 02/14/2009  . HYPERLIPIDEMIA 12/29/2008  . Essential hypertension 12/29/2008  . MYOCARDIAL INFARCTION, HX OF 12/29/2008  . CAD (coronary artery disease) 12/29/2008    Past Surgical History:  Procedure Laterality Date  . ABDOMINAL HYSTERECTOMY    . BREAST EXCISIONAL BIOPSY Left 11/02/2009   fibroadenoma (lumpectomy)  . BROW LIFT Right 04/07/2014   Procedure: REPAIR OF ENTROPION AND TRICHIASIS OF RIGHT LOWER EYE LID ;  Surgeon: Theodoro Kos, DO;  Location: [place];  Service: Plastics;  Laterality: Right;  . CARDIOVERSION N/A 07/18/2017   Procedure: CARDIOVERSION;  Surgeon: Skeet Latch, MD;  Location: [facility];  Service: Cardiovascular;  Laterality: N/A;  . CATARACT EXTRACTION W/ INTRAOCULAR LENS  IMPLANT, BILATERAL    . CHOLECYSTECTOMY N/A 12/06/2017   Procedure: LAPAROSCOPIC CHOLECYSTECTOMY;  Surgeon:  Jovita Kussmaul, MD;  [facility];  Service: General;  Laterality: N/A;  . CORONARY ANGIOPLASTY  04/ 1994  dr Lia Foyer   PCI to LAD  . CYSTOSCOPY W/ URETERAL STENT PLACEMENT Right 01/20/2017   Procedure: CYSTOSCOPY WITH RIGHT RETROGRADE PYELOGRAM/RIGHT URETERAL STENT PLACEMENT;  Surgeon: Alexis Frock, MD;  [facility];  Service: Urology;  Laterality: Right;  . CYSTOSCOPY WITH RETROGRADE PYELOGRAM, URETEROSCOPY AND STENT PLACEMENT Right 02/15/2017   Procedure: CYSTOSCOPY WITH RETROGRADE PYELOGRAM, URETEROSCOPY, STONE BASKETRY AND STENT REPLACEMENT;  Surgeon: Alexis Frock, MD;  Location:  [facility];  Service: Urology;  Laterality: Right;  . CYSTOSCOPY/RETROGRADE/URETEROSCOPY/STONE EXTRACTION WITH BASKET  yrs ago  . DILATION AND CURETTAGE OF UTERUS    . HOLMIUM LASER APPLICATION Right 01/26/2352   Procedure: HOLMIUM LASER APPLICATION;  Surgeon: Alexis Frock, MD;  Location[facility];  Service: Urology;  Laterality: Right;  . KNEE SURGERY     right  . NEUROPLASTY / TRANSPOSITION ULNAR NERVE AT ELBOW Left 02-23-2000    [facility]   and Left Carpal Tunnel Release  . TRANSTHORACIC ECHOCARDIOGRAM  02/26/2016   moderate focal basal and mild concentric LVH,  ef 55-60%,  akinesis of the basal-midanteroseptal, inferoseptal and apicalinferior myocardium, study not sufficient to evaluation diastolic funciton due to atrial fib./  trivial PR/  mild TR    OB History   None      Home Medications    Prior to Admission medications   Medication Sig Start Date End Date Taking? Authorizing Provider  acetaminophen (TYLENOL) 325 MG tablet Take 325 mg by mouth daily as needed for mild pain.     [provider]  azithromycin (ZITHROMAX) 250 MG tablet Take 1 tablet (250 mg total) by mouth daily. Take first 2 tablets together, then 1 every day until finished. 05/09/18   Raylene Everts, MD  diclofenac sodium (VOLTAREN) 1 % GEL Apply 2 g topically 4 (four) times daily. 04/25/18   Loura Halt A, NP  diltiazem (CARDIZEM CD) 120 MG 24 hr capsule TAKE ONE CAPSULE BY MOUTH TWICE DAILY 10/02/17   Sherran Needs, NP  ELIQUIS 5 MG TABS tablet TAKE 1 TABLET BY MOUTH TWICE DAILY 11/27/17   Sherran Needs, NP  lidocaine (LIDODERM) 5 % Place 1 patch onto the skin daily. Remove & Discard patch within 12 hours or as directed by MD 04/25/18   Loura Halt A, NP  Multiple Vitamins-Minerals (ICAPS AREDS 2) CAPS Take 2 capsules by mouth daily.     [provider]  Polyethyl Glycol-Propyl Glycol (SYSTANE OP) Place 1 drop into both eyes daily as needed (itchy eyes).     [provider]  traMADol (ULTRAM) 50 MG tablet Take 1 tablet (50 mg total) by mouth every 6 (six) hours as needed. 04/25/18   Loura Halt A, NP  triamcinolone (NASACORT ALLERGY 24HR) 55 MCG/ACT AERO nasal inhaler Place 1-2 sprays into the nose 2 (two) times daily as needed (for rhinitis).     [provider]  valACYclovir (VALTREX) 1000 MG tablet Take 1 tablet (1,000 mg total) by mouth 3 (three) times daily. 03/09/18   Margarita Mail, PA-C    Family History Family History  Problem Relation Age of Onset  . Coronary artery disease Unknown        positive family hx of  . Cancer Unknown        family hx of  . Brain cancer Son   . Liver cancer Daughter  Social History Social History   Tobacco Use  . Smoking status: Never Smoker  . Smokeless tobacco: Never Used  Substance Use Topics  . Alcohol use: No    Alcohol/week: 0.0 standard drinks  . Drug use: No     Allergies   Antihistamines, chlorpheniramine-type; Penicillins; Contrast media [iodinated diagnostic agents]; Sulfonamide derivatives; Atorvastatin; and Pheniramine   Review of Systems Review of Systems  Constitutional: Positive for appetite change, chills and fatigue. Negative for fever.  HENT: Positive for congestion, postnasal drip, rhinorrhea, sinus pressure and sinus pain. Negative for ear pain and sore throat.   Eyes: Positive for redness. Negative for pain and visual disturbance.  Respiratory: Positive for cough. Negative for shortness of breath.   Cardiovascular: Negative for chest pain and palpitations.  Gastrointestinal: Negative for abdominal pain and vomiting.  Genitourinary: Negative for dysuria and hematuria.  Musculoskeletal: Negative for arthralgias and back pain.  Skin: Negative for color change and rash.  Neurological: Negative for seizures and syncope.  Psychiatric/Behavioral: Positive for sleep disturbance.  All other systems reviewed and are negative.    Physical Exam Triage  Vital Signs ED Triage Vitals  Enc Vitals Group     BP 05/09/18 1101 (!) 159/99     Pulse Rate 05/09/18 1101 89     Resp 05/09/18 1101 18     Temp 05/09/18 1101 97.8 F (36.6 C)     Temp Source 05/09/18 1101 Oral     SpO2 05/09/18 1101 97 %     Weight --      Height --      Head Circumference --      Peak Flow --      Pain Score 05/09/18 1102 3     Pain Loc --      Pain Edu? --      Excl. in [place]? --    No data found.  Updated Vital Signs BP (!) 159/99 (BP Location: Left Arm)   Pulse 89   Temp 97.8 F (36.6 C) (Oral)   Resp 18   SpO2 97%  r:    Physical Exam  Constitutional: She appears well-developed and well-nourished. No distress.  Looks vigorous and younger than stated age  HENT:  Head: Normocephalic and atraumatic.  Right Ear: External ear normal.  Left Ear: External ear normal.  Mouth/Throat: Oropharynx is clear and moist.  Sinuses are not tender.  Nasal membranes are swollen.  Clear rhinorrhea.  Posterior pharynx mildly injected.  Voice is hoarse.  Eyes: Pupils are equal, round, and reactive to light.    Neck: Normal range of motion. Neck supple.  Cardiovascular: Normal rate, regular rhythm and normal heart sounds.  Pulmonary/Chest: Effort normal. No respiratory distress. She has rales.  Rhonchi throughout both lungs.  Faint crackles at bases.  Abdominal: Soft. She exhibits no distension.  Musculoskeletal: Normal range of motion. She exhibits no edema.  Lymphadenopathy:    She has no cervical adenopathy.  Neurological: She is alert.  Skin: Skin is warm and dry.  Psychiatric: She has a normal mood and affect. Her behavior is normal.     UC Treatments / Results  Labs (all labs ordered are listed, but only abnormal results are displayed) Labs Reviewed - No data to display  EKG None  Radiology No results found.  Procedures Procedures (including critical care time)  Medications Ordered in UC Medications - No data to display  Initial Impression /  Assessment and Plan / UC Course  I have reviewed the triage  vital signs and the nursing notes.  Pertinent labs & imaging results that were available during my care of the patient were reviewed by me and considered in my medical decision making (see chart for details).     I reviewed with Ms. Selvey that her symptoms are most certainly viral.  I told her she had about head and chest cold.  She is not open to the suggestion that she delay antibiotic treatment.  She has been conditioned by her primary care doctor over years to feel she needs antibiotics with the symptoms.  Rather than argue with a 5 year old, I will provide for her a Z-Pak.  I advised her to call her doctor if not better by Monday Final Clinical Impressions(s) / UC Diagnoses   Final diagnoses:  LRTI (lower respiratory tract infection)  Cough     Discharge Instructions     Get plenty of rest Make sure that you are drinking lots of fluids Continue using your Nasonex nasal spray Take the antibiotic as prescribed Call your doctor if not better by Monday   ED Prescriptions    Medication Sig Dispense Auth. Provider   azithromycin (ZITHROMAX) 250 MG tablet Take 1 tablet (250 mg total) by mouth daily. Take first 2 tablets together, then 1 every day until finished. 6 tablet Raylene Everts, MD     Controlled Substance Prescriptions [place] Controlled Substance Registry consulted? Not Applicable   Raylene Everts, MD 05/09/18 1227

## 2018-05-09 NOTE — Discharge Instructions (Addendum)
Get plenty of rest Make sure that you are drinking lots of fluids Continue using your Nasonex nasal spray Take the antibiotic as prescribed Call your doctor if not better by Monday

## 2018-05-26 ENCOUNTER — Telehealth (HOSPITAL_COMMUNITY): Payer: Self-pay | Admitting: *Deleted

## 2018-05-26 MED ORDER — DILTIAZEM HCL ER COATED BEADS 120 MG PO CP24: ORAL_CAPSULE | ORAL | 3 refills | Status: DC

## 2018-05-26 NOTE — Telephone Encounter (Signed)
Patient called in stating she currently is dealing with an URI just finishing antibiotics - she went back into AF over weekend HR in the 90-120 range. BP 130/84. Discussed with Roderic Palau NP -- will increase cardizem to 240mg  in the AM and 120mg  in the PM - she will call on Thursday of report of how she is doing. If still out of rhythm will plan to bring in for appointment.

## 2018-05-30 NOTE — Telephone Encounter (Signed)
Spoke to pt and she stated that her HR has gone down to 88 with the 2 tablets in the morning and 1 tablet in the evening of the metoprolol.  She was thinking that this is all from her URI.  Pt states she is feeling better.  She stated if it runs back up, she will call us back.

## 2018-06-03 DIAGNOSIS — N390 Urinary tract infection, site not specified: Secondary | ICD-10-CM | POA: Diagnosis not present

## 2018-06-03 DIAGNOSIS — R350 Frequency of micturition: Secondary | ICD-10-CM | POA: Diagnosis not present

## 2018-06-05 DIAGNOSIS — H353231 Exudative age-related macular degeneration, bilateral, with active choroidal neovascularization: Secondary | ICD-10-CM | POA: Diagnosis not present

## 2018-06-06 ENCOUNTER — Other Ambulatory Visit (HOSPITAL_COMMUNITY): Payer: Self-pay | Admitting: Nurse Practitioner

## 2018-06-06 MED ORDER — DILTIAZEM HCL ER COATED BEADS 120 MG PO CP24: 120.0000 mg | ORAL_CAPSULE | Freq: Two times a day (BID) | ORAL | 3 refills | Status: DC

## 2018-06-06 NOTE — Addendum Note (Signed)
Addended by: Juluis Mire on: 06/06/2018 04:22 PM   Modules accepted: Orders

## 2018-06-06 NOTE — Telephone Encounter (Signed)
Patient called in stating her HR has returned to normal she will go back to her normal dosing of cardizem of 120mg  BID. She will call if further issues.

## 2018-06-12 DIAGNOSIS — R310 Gross hematuria: Secondary | ICD-10-CM | POA: Diagnosis not present

## 2018-06-12 DIAGNOSIS — R350 Frequency of micturition: Secondary | ICD-10-CM | POA: Diagnosis not present

## 2018-06-12 DIAGNOSIS — R351 Nocturia: Secondary | ICD-10-CM | POA: Diagnosis not present

## 2018-06-30 ENCOUNTER — Other Ambulatory Visit (HOSPITAL_COMMUNITY): Payer: Self-pay | Admitting: *Deleted

## 2018-06-30 MED ORDER — DILTIAZEM HCL ER COATED BEADS 120 MG PO CP24: 120.0000 mg | ORAL_CAPSULE | Freq: Two times a day (BID) | ORAL | 3 refills | Status: DC

## 2018-07-05 DIAGNOSIS — S2239XA Fracture of one rib, unspecified side, initial encounter for closed fracture: Secondary | ICD-10-CM

## 2018-07-05 HISTORY — DX: Fracture of one rib, unspecified side, initial encounter for closed fracture: S22.39XA

## 2018-07-09 DIAGNOSIS — R200 Anesthesia of skin: Secondary | ICD-10-CM | POA: Diagnosis not present

## 2018-07-10 DIAGNOSIS — H353231 Exudative age-related macular degeneration, bilateral, with active choroidal neovascularization: Secondary | ICD-10-CM | POA: Diagnosis not present

## 2018-07-28 ENCOUNTER — Emergency Department (HOSPITAL_COMMUNITY)
Admission: EM | Admit: 2018-07-28 | Discharge: 2018-07-28 | Disposition: A | Discharge status: Home / Self Care | Payer: Medicare Other | Attending: Emergency Medicine | Admitting: Emergency Medicine

## 2018-07-28 ENCOUNTER — Emergency Department (HOSPITAL_COMMUNITY): Payer: Medicare Other

## 2018-07-28 ENCOUNTER — Encounter (HOSPITAL_COMMUNITY): Payer: Self-pay

## 2018-07-28 ENCOUNTER — Other Ambulatory Visit: Payer: Self-pay

## 2018-07-28 DIAGNOSIS — S2232XA Fracture of one rib, left side, initial encounter for closed fracture: Secondary | ICD-10-CM | POA: Diagnosis not present

## 2018-07-28 DIAGNOSIS — N183 Chronic kidney disease, stage 3 (moderate): Secondary | ICD-10-CM | POA: Diagnosis not present

## 2018-07-28 DIAGNOSIS — S299XXA Unspecified injury of thorax, initial encounter: Secondary | ICD-10-CM | POA: Diagnosis present

## 2018-07-28 DIAGNOSIS — Z79899 Other long term (current) drug therapy: Secondary | ICD-10-CM | POA: Insufficient documentation

## 2018-07-28 DIAGNOSIS — I2510 Atherosclerotic heart disease of native coronary artery without angina pectoris: Secondary | ICD-10-CM | POA: Diagnosis not present

## 2018-07-28 DIAGNOSIS — Y999 Unspecified external cause status: Secondary | ICD-10-CM | POA: Insufficient documentation

## 2018-07-28 DIAGNOSIS — Z7901 Long term (current) use of anticoagulants: Secondary | ICD-10-CM | POA: Diagnosis not present

## 2018-07-28 DIAGNOSIS — Y939 Activity, unspecified: Secondary | ICD-10-CM | POA: Insufficient documentation

## 2018-07-28 DIAGNOSIS — W01190A Fall on same level from slipping, tripping and stumbling with subsequent striking against furniture, initial encounter: Secondary | ICD-10-CM | POA: Insufficient documentation

## 2018-07-28 DIAGNOSIS — I129 Hypertensive chronic kidney disease with stage 1 through stage 4 chronic kidney disease, or unspecified chronic kidney disease: Secondary | ICD-10-CM | POA: Diagnosis not present

## 2018-07-28 DIAGNOSIS — Y929 Unspecified place or not applicable: Secondary | ICD-10-CM | POA: Insufficient documentation

## 2018-07-28 DIAGNOSIS — W19XXXA Unspecified fall, initial encounter: Secondary | ICD-10-CM

## 2018-07-28 DIAGNOSIS — R0781 Pleurodynia: Secondary | ICD-10-CM | POA: Diagnosis not present

## 2018-07-28 DIAGNOSIS — S2231XA Fracture of one rib, right side, initial encounter for closed fracture: Secondary | ICD-10-CM | POA: Diagnosis not present

## 2018-07-28 NOTE — Discharge Instructions (Addendum)
You were seen in the emergency department for left-sided rib pain after a fall last week.  Your chest x-ray showed that you had a rib fracture.  These are very painful and take time to heal.  You can use Tylenol as needed for pain.  Follow-up with your doctor or return if any worsening symptoms.

## 2018-07-28 NOTE — ED Notes (Signed)
Patient given discharge teaching and verbalized understanding. Patient ambulated out of ED with a steady gait. 

## 2018-07-28 NOTE — ED Triage Notes (Addendum)
Pt states that last week, she fell into a wooden chair, injuring her left rib cage area. Pt states that the pain has made it difficult to sleep. Pt also adds pain is worse with respiration

## 2018-07-28 NOTE — ED Provider Notes (Signed)
[place] DEPT Provider Note   CSN: [id number] Arrival date & time: 07/28/18  1036    History   Chief Complaint Chief Complaint  Patient presents with  . Fall    HPI Shelly Obrien is a 85 y.o. female.  She is here for evaluation of left lateral and posterior chest wall pain after she fell onto the edge of a chair about a week ago.  Since that fall she is been having pain with any turning or twisting coughing sneezing.  She said it made it difficult to sleep and she is needing to sleep in a recliner.  It increases with deep breath.  She denies any cough.  No fevers or chills.  She does not feel short of breath.  She is tried a pain patch without any improvement.     The history is provided by the patient.  Fall  This is a new problem. Episode onset: 5 days. The problem occurs constantly. The problem has not changed since onset.Associated symptoms include chest pain. Pertinent negatives include no abdominal pain, no headaches and no shortness of breath. The symptoms are aggravated by bending, twisting, sneezing and coughing. The symptoms are relieved by position. She has tried acetaminophen for the symptoms. The treatment provided mild relief.    Past Medical History:  Diagnosis Date  . CAD S/P percutaneous coronary angioplasty    hx MI 04/ 1994  s/p  PCI to LAD  . CKD (chronic kidney disease), stage III ([place])   . Complication of anesthesia    hard to wake   . History of basal cell carcinoma (BCC) excision    right eye area 08/ 2013  . History of kidney stones 01/20/2017  . History of MI (myocardial infarction) 09/1992   s/p  PCI to LAD  . History of squamous cell carcinoma excision    right lower leg 2015 ;  2016  . Hyperlipidemia   . Hypertension   . Macular degeneration   . PAF (paroxysmal atrial fibrillation) ([place]) dx 02-24-2016   followed by cardiologist-- dr Mamie Nick. [name]  . PONV (postoperative nausea and vomiting)   . Right ureteral stone     . Wears glasses     Patient Active Problem List   Diagnosis Date Noted  . Constipation   . Acute on chronic cholecystitis s/p lap cholecystectomy 12/06/2017 12/04/2017  . Nephrolithiasis 12/04/2017  . Abdominal pain 12/04/2017  . Spinal stenosis in cervical region 04/24/2017  . Spinal stenosis of lumbar region 04/24/2017  . Dizziness 04/24/2017  . Ureteral stone with hydronephrosis 01/20/2017  . Atrial fibrillation with RVR ([place]) 02/24/2016  . CKD (chronic kidney disease) stage 3, GFR 30-59 ml/min (H[facility]) 02/24/2016  . Paroxysmal atrial fibrillation ([place]) 02/24/2016  . Exudative macular degeneration ([place]) 02/24/2016  . CHOLELITHIASIS 02/14/2009  . HYPERLIPIDEMIA 12/29/2008  . Essential hypertension 12/29/2008  . MYOCARDIAL INFARCTION, HX OF 12/29/2008  . CAD (coronary artery disease) 12/29/2008    Past Surgical History:  Procedure Laterality Date  . ABDOMINAL HYSTERECTOMY    . BREAST EXCISIONAL BIOPSY Left 11/02/2009   fibroadenoma (lumpectomy)  . BROW LIFT Right 04/07/2014   Procedure: REPAIR OF ENTROPION AND TRICHIASIS OF RIGHT LOWER EYE LID ;  Surgeon: Theodoro Kos, DO;  Location: [facility];  Service: Plastics;  Laterality: Right;  . CARDIOVERSION N/A 07/18/2017   Procedure: CARDIOVERSION;  Surgeon: Skeet Latch, MD;  Location: [facility];  Service: Cardiovascular;  Laterality: N/A;  . CATARACT EXTRACTION W/ INTRAOCULAR LENS  IMPLANT, BILATERAL    . CHOLECYSTECTOMY N/A 12/06/2017   Procedure: LAPAROSCOPIC CHOLECYSTECTOMY;  Surgeon: Jovita Kussmaul, MD;  Location: [facility];  Service: General;  Laterality: N/A;  . CORONARY ANGIOPLASTY  04/ 1994  dr Lia Foyer   PCI to LAD  . CYSTOSCOPY W/ URETERAL STENT PLACEMENT Right 01/20/2017   Procedure: CYSTOSCOPY WITH RIGHT RETROGRADE PYELOGRAM/RIGHT URETERAL STENT PLACEMENT;  Surgeon: Alexis Frock, MD;  Location[facility];  Service: Urology;  Laterality: Right;  . CYSTOSCOPY WITH RETROGRADE PYELOGRAM, URETEROSCOPY AND  STENT PLACEMENT Right 02/15/2017   Procedure: CYSTOSCOPY WITH RETROGRADE PYELOGRAM, URETEROSCOPY, STONE BASKETRY AND STENT REPLACEMENT;  Surgeon: Alexis Frock, MD;  Location: [facility];  Service: Urology;  Laterality: Right;  . CYSTOSCOPY/RETROGRADE/URETEROSCOPY/STONE EXTRACTION WITH BASKET  yrs ago  . DILATION AND CURETTAGE OF UTERUS    . HOLMIUM LASER APPLICATION Right 9/67/8938   Procedure: HOLMIUM LASER APPLICATION;  Surgeon: Alexis Frock, MD;  [facility];  Service: Urology;  Laterality: Right;  . KNEE SURGERY     right  . NEUROPLASTY / TRANSPOSITION ULNAR NERVE AT ELBOW Left 02-23-2000    [facility]   and Left Carpal Tunnel Release  . TRANSTHORACIC ECHOCARDIOGRAM  02/26/2016   moderate focal basal and mild concentric LVH,  ef 55-60%,  akinesis of the basal-midanteroseptal, inferoseptal and apicalinferior myocardium, study not sufficient to evaluation diastolic funciton due to atrial fib./  trivial PR/  mild TR     OB History   No obstetric history on file.      Home Medications    Prior to Admission medications   Medication Sig Start Date End Date Taking? Authorizing Provider  acetaminophen (TYLENOL) 325 MG tablet Take 325 mg by mouth daily as needed for mild pain.     [provider]  azithromycin (ZITHROMAX) 250 MG tablet Take 1 tablet (250 mg total) by mouth daily. Take first 2 tablets together, then 1 every day until finished. 05/09/18   Raylene Everts, MD  diclofenac sodium (VOLTAREN) 1 % GEL Apply 2 g topically 4 (four) times daily. 04/25/18   Loura Halt A, NP  diltiazem (CARDIZEM CD) 120 MG 24 hr capsule Take 1 capsule (120 mg total) by mouth 2 (two) times daily. 06/30/18   Sherran Needs, NP  ELIQUIS 5 MG TABS tablet TAKE 1 TABLET BY MOUTH TWICE DAILY 06/06/18   Sherran Needs, NP  lidocaine (LIDODERM) 5 % Place 1 patch onto the skin daily. Remove & Discard patch within 12 hours or as directed by MD 04/25/18   Loura Halt A, NP  Multiple Vitamins-Minerals (ICAPS AREDS 2) CAPS Take 2 capsules by mouth daily.     [provider]  Polyethyl Glycol-Propyl Glycol (SYSTANE OP) Place 1 drop into both eyes daily as needed (itchy eyes).    [provider]  traMADol (ULTRAM) 50 MG tablet Take 1 tablet (50 mg total) by mouth every 6 (six) hours as needed. 04/25/18   Loura Halt A, NP  triamcinolone (NASACORT ALLERGY 24HR) 55 MCG/ACT AERO nasal inhaler Place 1-2 sprays into the nose 2 (two) times daily as needed (for rhinitis).     [provider]  valACYclovir (VALTREX) 1000 MG tablet Take 1 tablet (1,000 mg total) by mouth 3 (three) times daily. 03/09/18   Margarita Mail, PA-C    Family History Family History  Problem Relation Age of Onset  . Coronary artery disease Other        positive family hx of  .  Cancer Other        family hx of  . Brain cancer Son   . Liver cancer Daughter     Social History Social History   Tobacco Use  . Smoking status: Never Smoker  . Smokeless tobacco: Never Used  Substance Use Topics  . Alcohol use: No    Alcohol/week: 0.0 standard drinks  . Drug use: No     Allergies   Antihistamines, chlorpheniramine-type; Penicillins; Contrast media [iodinated diagnostic agents]; Sulfonamide derivatives; Atorvastatin; and Pheniramine   Review of Systems Review of Systems  Constitutional: Negative for fever.  HENT: Negative for sore throat.   Eyes: Positive for redness (right - chronic). Negative for visual disturbance.  Respiratory: Negative for shortness of breath.   Cardiovascular: Positive for chest pain.  Gastrointestinal: Negative for abdominal pain.  Genitourinary: Negative for dysuria.  Musculoskeletal: Negative for neck pain.  Skin: Negative for rash.  Neurological: Negative for headaches.     Physical Exam Updated Vital Signs BP (!) 172/98 (BP Location: Left Arm)   Pulse 98   Temp (!) 97.5 F (36.4 C) (Oral)   Resp 16   Ht 5'  5.5" (1.664 m)   Wt 72.6 kg   SpO2 98%   BMI 26.22 kg/m   Physical Exam Vitals signs and nursing note reviewed.  Constitutional:      General: She is not in acute distress.    Appearance: She is well-developed.  HENT:     Head: Normocephalic and atraumatic.  Eyes:     Conjunctiva/sclera: Conjunctivae normal.  Neck:     Musculoskeletal: Neck supple.  Cardiovascular:     Rate and Rhythm: Normal rate and regular rhythm.     Heart sounds: No murmur.  Pulmonary:     Effort: Pulmonary effort is normal. No respiratory distress.     Breath sounds: Normal breath sounds.  Chest:     Chest wall: Tenderness (Left mid and posterior) present. No mass or crepitus.  Abdominal:     Palpations: Abdomen is soft.     Tenderness: There is no abdominal tenderness.  Musculoskeletal: Normal range of motion.        General: No tenderness.  Skin:    General: Skin is warm and dry.     Capillary Refill: Capillary refill takes less than 2 seconds.  Neurological:     General: No focal deficit present.     Mental Status: She is alert and oriented to person, place, and time.     Gait: Gait normal.      ED Treatments / Results  Labs (all labs ordered are listed, but only abnormal results are displayed) Labs Reviewed - No data to display  EKG None  Radiology Dg Ribs Unilateral W/chest Left  Result Date: 07/28/2018 CLINICAL DATA:  Pain following fall EXAM: LEFT RIBS AND CHEST - 3+ VIEW COMPARISON:  Chest radiograph December 14, 2017 FINDINGS: Frontal chest as well as oblique and cone-down rib images were obtained. Lungs are clear. Heart size and pulmonary vascularity are normal. No adenopathy. There is a fracture of the anterior left tenth rib with slight displacement at the fracture site. No other fracture is evident. No pneumothorax or pleural effusion evident. There is aortic atherosclerosis. IMPRESSION: Slightly displaced fracture anterior left tenth rib. No other fracture is demonstrable. Lungs are  clear. No pneumothorax. Aortic Atherosclerosis (ICD10-I70.0). Electronically Signed   By: Lowella Grip III M.D.   On: 07/28/2018 12:25    Procedures Procedures (including critical care time)  Medications Ordered in ED Medications - No data to display   Initial Impression / Assessment and Plan / ED Course  I have reviewed the triage vital signs and the nursing notes.  Pertinent labs & imaging results that were available during my care of the patient were reviewed by me and considered in my medical decision making (see chart for details).  Clinical Course as of Jul 28 1614  Mon Jul 28, 2018  1251 Patient chest x-ray shows a single rib fracture on the left with no evidence of pneumothorax or pneumonia.  I let her know about this and she said she will continue to use Tylenol as needed for pain.  She understands to follow-up with her doctor and return if any worsening symptoms.   [MB]    Clinical Course User Index [MB] Hayden Rasmussen, MD       Final Clinical Impressions(s) / ED Diagnoses   Final diagnoses:  Closed fracture of one rib of left side, initial encounter  Fall, initial encounter    ED Discharge Orders    None       Hayden Rasmussen, MD 07/28/18 1616

## 2018-07-28 NOTE — ED Notes (Signed)
Patient ambulated to X-Ray and back to room 10.

## 2018-07-28 NOTE — ED Notes (Signed)
Patient transported to X-ray 

## 2018-08-07 DIAGNOSIS — H353231 Exudative age-related macular degeneration, bilateral, with active choroidal neovascularization: Secondary | ICD-10-CM | POA: Diagnosis not present

## 2018-08-11 ENCOUNTER — Encounter: Payer: Self-pay | Admitting: Neurology

## 2018-08-11 ENCOUNTER — Telehealth: Payer: Self-pay | Admitting: *Deleted

## 2018-08-11 ENCOUNTER — Institutional Professional Consult (permissible substitution): Payer: Medicare Other | Admitting: Neurology

## 2018-08-11 ENCOUNTER — Encounter: Payer: Self-pay | Admitting: *Deleted

## 2018-08-11 NOTE — Progress Notes (Deleted)
[facility]    Provider:  Dr Jaynee Eagles Referring Provider: Minna Merritts Primary Care Physician:  [name], MD  CC:  Benign positional vertigo  HPI:  Shelly Obrien is a 85 y.o. female here as a referral for benign positional vertigo. 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 She has multiple allergies.  Patient reports she has neck pain and dizziness. She has had dizziness off and on for years and pain on the left side of her neck, pain from the neck and shooting into the left arm. It is the worst paoin she feels. She can elicit the pain with neck movement and weakness and numbness in the left hand. The pain is severe and progressive, sharp and brief, happens daily like when she is putting on her bra when she moves her arm. She have dizziness on and off, it will "hit" 1-2 times a day for several minutes and she feels very dizzy like she is "going out", and imbalance during the episodes, she feels unsteady and dizzy. If happens with sudden movement of the neck such as when turning to the left or back. Happens when she roll sover to the left side. No other focal neurologic deficits, associated symptoms, inciting events or modifiable factors.  Reviewed notes, labs and imaging from outside physicians, which showed:  Reviewed notes from Dr. Jeneen Rinks crossly ear nose and throat. Diagnosis was impacted serum in left ear, benign paroxysmal vertigo, vertigo of central origin bilaterally. Patient presented with vertigo for approximately 3 minutes when she turns her head a certain way and does get a three-minute episode of vertigo. We secondly she could be having some orthostatic hypotension because when she bends down he gets up suddenly she  also feels very lightheaded. Exam showed years to be quite excellent. She has had serum and impaction of the left ear which he removed. No otitis externa or otitis media. Audiogram showed a normal impedance with a high-frequency sensorineural hearing loss. Remaining exam was normal.  Review of Systems: Patient complains of symptoms per HPI as well as the following symptoms: no CP, no SOB. Pertinent negatives and positives per HPI. All others negative.   Social History   Socioeconomic History  . Marital status: Widowed    Spouse name: Not on file  . Number of children: 2  . Years of education: Not on file  . Highest education level: Not on file  Occupational History  . Occupation: Retired  Scientific laboratory technician  . Financial resource strain: Not on file  . Food insecurity:    Worry: Not on file    Inability: Not on file  . Transportation needs:    Medical: Not on file    Non-medical: Not on file  Tobacco Use  . Smoking status: Never Smoker  . Smokeless tobacco: Never Used  Substance and Sexual Activity  . Alcohol use: No    Alcohol/week: 0.0 standard drinks  . Drug use: No  . Sexual activity: Not on file  Lifestyle  . Physical activity:    Days per week: Not on file    Minutes per session: Not on file  . Stress: Not on file  Relationships  . Social connections:    Talks on phone: Not on file    Gets together: Not on file    Attends religious  service: Not on file    Active member of club or organization: Not on file    Attends meetings of clubs or organizations: Not on file    Relationship status: Not on file  . Intimate partner violence:    Fear of current or ex partner: Not on file    Emotionally abused: Not on file    Physically abused: Not on file    Forced sexual activity: Not on file  Other Topics Concern  . Not on file  Social History Narrative   Lives at home    Family History  Problem Relation Age of Onset  . Coronary artery disease Other        positive family  hx of  . Cancer Other        family hx of  . Brain cancer Son   . Liver cancer Daughter     Past Medical History:  Diagnosis Date  . Aortic ectasia ([place])   . Arm fracture 2013   right, Dr. Sharen Heck  . B12 deficiency   . CAD S/P percutaneous coronary angioplasty    hx MI 04/ 1994  s/p  PCI to LAD  . CKD (chronic kidney disease), stage III ([place])   . Complication of anesthesia    hard to wake   . Gallstones   . History of basal cell carcinoma (BCC) excision    right eye area 08/ 2013  . History of kidney stones 01/20/2017  . History of MI (myocardial infarction) 09/1992   s/p  PCI to LAD  . History of squamous cell carcinoma excision    right lower leg 2015 ;  2016  . Hx of colonic polyps   . Hyperlipidemia   . Hypertension   . Macular degeneration   . Osteoporosis   . PAF (paroxysmal atrial fibrillation) ([place]) dx 02-24-2016   followed by cardiologist-- dr Mamie Nick. [name]  . Pernicious anemia   . PONV (postoperative nausea and vomiting)   . Right ureteral stone   . Wears glasses     Past Surgical History:  Procedure Laterality Date  . ABDOMINAL HYSTERECTOMY    . BREAST EXCISIONAL BIOPSY Left 11/02/2009   fibroadenoma (lumpectomy)  . BROW LIFT Right 04/07/2014   Procedure: REPAIR OF ENTROPION AND TRICHIASIS OF RIGHT LOWER EYE LID ;  Surgeon: Theodoro Kos, DO;  Location: [facility];  Service: Plastics;  Laterality: Right;  . CARDIOVERSION N/A 07/18/2017   Procedure: CARDIOVERSION;  Surgeon: Skeet Latch, MD;  Location: [facility];  Service: Cardiovascular;  Laterality: N/A;  . CATARACT EXTRACTION W/ INTRAOCULAR LENS  IMPLANT, BILATERAL    . CHOLECYSTECTOMY N/A 12/06/2017   Procedure: LAPAROSCOPIC CHOLECYSTECTOMY;  Surgeon: Jovita Kussmaul, MD;  [facility];  Service: General;  Laterality: N/A;  . CORONARY ANGIOPLASTY  04/ 1994  dr Lia Foyer   PCI to LAD  . CORONARY ANGIOPLASTY    . CYSTOSCOPY W/ URETERAL STENT PLACEMENT Right 01/20/2017   Procedure:  CYSTOSCOPY WITH RIGHT RETROGRADE PYELOGRAM/RIGHT URETERAL STENT PLACEMENT;  Surgeon: Alexis Frock, MD;  [facility];  Service: Urology;  Laterality: Right;  . CYSTOSCOPY WITH RETROGRADE PYELOGRAM, URETEROSCOPY AND STENT PLACEMENT Right 02/15/2017   Procedure: CYSTOSCOPY WITH RETROGRADE PYELOGRAM, URETEROSCOPY, STONE BASKETRY AND STENT REPLACEMENT;  Surgeon: Alexis Frock, MD;  Location: [facility];  Service: Urology;  Laterality: Right;  . CYSTOSCOPY/RETROGRADE/URETEROSCOPY/STONE EXTRACTION WITH BASKET  yrs ago  . DILATION AND CURETTAGE OF UTERUS    . HOLMIUM LASER APPLICATION Right 7/78/2423  Procedure: HOLMIUM LASER APPLICATION;  Surgeon: Alexis Frock, MD;  Location: [facility];  Service: Urology;  Laterality: Right;  . KNEE SURGERY     right  . NEUROPLASTY / TRANSPOSITION ULNAR NERVE AT ELBOW Left 02-23-2000    [facility]   and Left Carpal Tunnel Release  . r eyelid cancer    . TONSILLECTOMY    . TRANSTHORACIC ECHOCARDIOGRAM  02/26/2016   moderate focal basal and mild concentric LVH,  ef 55-60%,  akinesis of the basal-midanteroseptal, inferoseptal and apicalinferior myocardium, study not sufficient to evaluation diastolic funciton due to atrial fib./  trivial PR/  mild TR  . UMBILICAL HERNIA REPAIR      Current Outpatient Medications  Medication Sig Dispense Refill  . acetaminophen (TYLENOL) 325 MG tablet Take 650 mg by mouth every 6 (six) hours.     [name] azithromycin (ZITHROMAX) 250 MG tablet Take 1 tablet (250 mg total) by mouth daily. Take first 2 tablets together, then 1 every day until finished. 6 tablet 0  . diclofenac sodium (VOLTAREN) 1 % GEL Apply 2 g topically 4 (four) times daily. 1 Tube 0  . diltiazem (CARDIZEM CD) 120 MG 24 hr capsule Take 1 capsule (120 mg total) by mouth 2 (two) times daily. 180 capsule 3  . ELIQUIS 5 MG TABS tablet TAKE 1 TABLET BY MOUTH TWICE DAILY 60 tablet 6  . lidocaine (LIDODERM) 5 % Place 1 patch onto the skin  daily. Remove & Discard patch within 12 hours or as directed by MD 30 patch 0  . Multiple Vitamins-Minerals (ICAPS AREDS 2) CAPS Take 2 capsules by mouth daily.     Vladimir Faster Glycol-Propyl Glycol (SYSTANE OP) Place 1 drop into both eyes daily as needed (itchy eyes).    . sennosides-docusate sodium (SENOKOT-S) 8.6-50 MG tablet Take 2 tablets by mouth at bedtime.    . traMADol (ULTRAM) 50 MG tablet Take 1 tablet (50 mg total) by mouth every 6 (six) hours as needed. 10 tablet 0  . triamcinolone (NASACORT ALLERGY 24HR) 55 MCG/ACT AERO nasal inhaler Place 1-2 sprays into the nose 2 (two) times daily as needed (for rhinitis).     . valACYclovir (VALTREX) 1000 MG tablet Take 1 tablet (1,000 mg total) by mouth 3 (three) times daily. 21 tablet 0   No current facility-administered medications for this visit.     Allergies as of 08/11/2018 - Review Complete 07/28/2018  Allergen Reaction Noted  . Antihistamines, chlorpheniramine-type Other (See Comments) 02/25/2015  . Penicillins Anaphylaxis and Other (See Comments) 12/29/2008  . Contrast media [iodinated diagnostic agents] Hives 05/17/2013  . Sulfonamide derivatives Other (See Comments) 12/29/2008  . Atorvastatin Other (See Comments) 02/16/2016  . Pheniramine Other (See Comments) 09/25/2015    Vitals: There were no vitals taken for this visit. Last Weight:  Wt Readings from Last 1 Encounters:  07/28/18 160 lb (72.6 kg)   Last Height:   Ht Readings from Last 1 Encounters:  07/28/18 5' 5.5" (1.664 m)     Physical exam: Exam: Gen: NAD, conversant, well nourised, obese, well groomed                     CV: irregular, no MRG. No Carotid Bruits. No peripheral edema, warm, nontender Eyes: Conjunctivae clear without exudates or hemorrhage  Neuro: Detailed Neurologic Exam  Speech:    Speech is normal; fluent and spontaneous with normal comprehension.  Cognition:    The patient is oriented to person, place, and time;  recent and remote  memory impaired ;     language fluent;     Impaired attention, concentration,     fund of knowledge Cranial Nerves:    The pupils are equal, round, and reactive to light. Attempted fundoscopic exam could not visualize. Mild dysconjugate gaze(right eye elevation and exzotropia) but EOMI. Visual fields are full to finger confrontation. Trigeminal sensation is intact and the muscles of mastication are normal. The face is symmetric. The palate elevates in the midline. Hearing impaired. Voice is normal. Shoulder shrug is normal. The tongue has normal motion without fasciculations.   Coordination:    No dysmetria  Gait:    Antalgic, recent left leg surgery (bandaged currently)  Motor Observation:    No asymmetry, no atrophy, and no involuntary movements noted. Tone:    Normal muscle tone.    Posture:    Posture is normal. normal erect    Strength: Mild proximal weakness more pronounced left deltoid and triceps 4/5.      Sensation: intact to LT     Reflex Exam:  DTR's:    Deep tendon reflexes in the upper and lower extremities are equivocal bilaterally.   Toes:    The toes are downgoing bilaterally.   Clonus:    Clonus is absent.     Assessment/Plan:  An absolutely lovely 85 y.o. female here as a referral for benign positional vertigo. Patient has past medical history hyperlipidemia, essential hypertension, myocardial infarction, coronary artery disease, cholelithiasis, H fibrillation, chronic kidney disease, stage III macular degeneration, ectropion of the right eye secondary to an excision of the lesion on her lower leg  - Patient has focal weakness of the left arm, neck pain, radicular pain of the left side that is exacerbated with head movements need MRI of the cervical spine to evaluate for radiculopathy for surgical interventions or epidural steroid injections. She has tried conservative measures for several months including resting, analgesics. - Dizziness: Patient has had  dizziness for years, off-and-on, she feels dizzy over the last 3 weeks, exacerbated with head movements especially head extension or rolling over to her left side. Appears to be benign positional vertigo and will send her to vestibular therapy. However need to rule out other intracerebral causes such as schwannoma, strokes or other intracranial lesions. Patient requests an open MRI due to claustrophobia. Discussed fall precautions   No orders of the defined types were placed in this encounter.    Sarina Ill, MD  [facility] [address] [place], [place] [address]  Phone [phone] Fax [phone]

## 2018-08-11 NOTE — Progress Notes (Signed)
[id number]  Provider:  Dr Jaynee Eagles Requesting provider: [name], MD Primary Care Physician:  [name], MD  CC:  Numbness  HPI 08/12/2018: This is a 85 year old female here as a referral for a new problem numbness.  She was seen prior in the office 2 years ago for benign positional vertigo.  She has a past medical history of benign positional vertigo, hypertension, hyperlipidemia, myocardial infarction, coronary artery disease, atrial fibrillation, chronic kidney disease, macular degeneration, ectropion of the right eye, vitamin B12 deficiency, osteoarthritis, impaired fasting glucose, gait disorder.  Requesting provider is Dr. Laurann Montana.  She has numbness in the legs which may be polyneuropathy vs radiculopathy.  She has had a gradual onset of numbness in both legs, it starts in her feet and goes up to about the mid calf, there is mild tingling but no significant pain, it has caused some trouble with balance, physical therapy did not help much, she can walk fairly steadily with a cane.  She has difficulty walking, balance is impaired. She went to physical therapy. She went to some clinic and they told her they could help for $5000 which I discouraged. She went to PT. When she gets up if she sits a long time she feels a little imbalanced but then she is fine. Her feet are numb up the ankles. Her toes tingle. Been ongoing for years. Not worsening, stable. It just feels strange. She has to be careful with shoes. She fell 2 weeks ago but she fell over a rug with her bedroom shoes, she has since removed the rug otherwise no falls, she uses a cane. Legs don't get tired She has back pain. She also has neck pain and shots have not helped. Not painful however. She can walk, she works in the flowers.   Personally reviewed images and agree with the following: MRI lumbar spine:  1. Advanced and diffuse degeneration with mild scoliosis. 2. Multilevel foraminal impingement due to disc  height loss and spurring, most notable on the right at T12-L1, bilaterally at L1-2, and on the left at L2-3 and L3-4. 3. Spinal stenosis that is moderate at L3-4 and high-grade at L4-5. 4. L5-S1 inferiorly migrating right paracentral disc protrusion. Although the herniation is small there is impingement on the right S1 nerve root. 5. Cholelithiasis  HPI 01/10/2017:  SHERRI MCARTHY is a 85 y.o. female here as a referral for benign positional vertigo. 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 She has multiple allergies.  Patient reports she has neck pain and dizziness. She has had dizziness off and on for years and pain on the left side of her neck, pain from the neck and shooting into the left arm. It is the worst paoin she feels. She can elicit the pain with neck movement and weakness and numbness in the left hand. The pain is severe and progressive, sharp and brief, happens daily like when she is putting on her bra when she moves her arm. She have dizziness on and off, it will "hit" 1-2 times a day for several minutes and she feels very dizzy like she is "going out", and imbalance during the episodes, she feels unsteady and dizzy. If happens with sudden movement of the neck such as when turning to the left or back. Happens when she roll sover to  the left side. No other focal neurologic deficits, associated symptoms, inciting events or modifiable factors.  Reviewed notes, labs and imaging from outside physicians, which showed:  Reviewed notes from Dr. Jeneen Rinks crossly ear nose and throat. Diagnosis was impacted serum in left ear, benign paroxysmal vertigo, vertigo of central origin bilaterally. Patient presented with vertigo for approximately 3 minutes when she  turns her head a certain way and does get a three-minute episode of vertigo. We secondly she could be having some orthostatic hypotension because when she bends down he gets up suddenly she also feels very lightheaded. Exam showed years to be quite excellent. She has had serum and impaction of the left ear which he removed. No otitis externa or otitis media. Audiogram showed a normal impedance with a high-frequency sensorineural hearing loss. Remaining exam was normal.  Review of Systems: Patient complains of symptoms per HPI as well as the following symptoms: no CP, no SOB. Pertinent negatives and positives per HPI. All others negative.   Social History   Socioeconomic History  . Marital status: Widowed    Spouse name: Not on file  . Number of children: 2  . Years of education: Not on file  . Highest education level: Not on file  Occupational History  . Occupation: Retired  Scientific laboratory technician  . Financial resource strain: Not on file  . Food insecurity:    Worry: Not on file    Inability: Not on file  . Transportation needs:    Medical: Not on file    Non-medical: Not on file  Tobacco Use  . Smoking status: Former Smoker    Years: 5.00    Types: Cigarettes  . Smokeless tobacco: Never Used  . Tobacco comment: "didn't have the habit that much", just smoked "with the girls"  Substance and Sexual Activity  . Alcohol use: No    Alcohol/week: 0.0 standard drinks  . Drug use: No  . Sexual activity: Not on file  Lifestyle  . Physical activity:    Days per week: Not on file    Minutes per session: Not on file  . Stress: Not on file  Relationships  . Social connections:    Talks on phone: Not on file    Gets together: Not on file    Attends religious service: Not on file    Active member of club or organization: Not on file    Attends meetings of clubs or organizations: Not on file    Relationship status: Not on file  . Intimate partner violence:    Fear of current or ex partner:  Not on file    Emotionally abused: Not on file    Physically abused: Not on file    Forced sexual activity: Not on file  Other Topics Concern  . Not on file  Social History Narrative   Lives at home alone   Right handed    Family History  Problem Relation Age of Onset  . Coronary artery disease Other        positive family hx of  . Cancer Other        family hx of  . Brain cancer Son   . Liver cancer Daughter   . Neuropathy Neg Hx     Past Medical History:  Diagnosis Date  . Aortic ectasia ([place])   . Arm fracture 2013   right, Dr. Sharen Heck  . B12 deficiency   . CAD S/P percutaneous coronary angioplasty    hx MI 04/ 1994  s/p  PCI to LAD  . CKD (chronic kidney disease), stage III ([place])   . Complication of anesthesia    hard to wake   . Gallstones   . History of basal cell carcinoma (BCC) excision    right eye area 08/ 2013  . History of kidney stones 01/20/2017  . History of MI (myocardial infarction) 09/1992   s/p  PCI to LAD  . History of squamous cell carcinoma excision    right lower leg 2015 ;  2016  . Hx of colonic polyps   . Hyperlipidemia   . Hypertension   . Macular degeneration   . Osteoporosis   . PAF (paroxysmal atrial fibrillation) ([place]) dx 02-24-2016   followed by cardiologist-- dr Mamie Nick. [name]  . Pernicious anemia   . PONV (postoperative nausea and vomiting)   . Rib fracture 07/2018   left  . Right ureteral stone   . Wears glasses     Past Surgical History:  Procedure Laterality Date  . ABDOMINAL HYSTERECTOMY    . BREAST EXCISIONAL BIOPSY Left 11/02/2009   fibroadenoma (lumpectomy)  . BROW LIFT Right 04/07/2014   Procedure: REPAIR OF ENTROPION AND TRICHIASIS OF RIGHT LOWER EYE LID ;  Surgeon: Theodoro Kos, DO;  Location: [place];  Service: Plastics;  Laterality: Right;  . CARDIOVERSION N/A 07/18/2017   Procedure: CARDIOVERSION;  Surgeon: Skeet Latch, MD;  Location: [place];  Service: Cardiovascular;  Laterality: N/A;    . CATARACT EXTRACTION W/ INTRAOCULAR LENS  IMPLANT, BILATERAL    . CHOLECYSTECTOMY N/A 12/06/2017   Procedure: LAPAROSCOPIC CHOLECYSTECTOMY;  Surgeon: Jovita Kussmaul, MD;  Location: [facility];  Service: General;  Laterality: N/A;  . CORONARY ANGIOPLASTY  04/ 1994  dr Lia Foyer   PCI to LAD  . CORONARY ANGIOPLASTY    . CYSTOSCOPY W/ URETERAL STENT PLACEMENT Right 01/20/2017   Procedure: CYSTOSCOPY WITH RIGHT RETROGRADE PYELOGRAM/RIGHT URETERAL STENT PLACEMENT;  Surgeon: Alexis Frock, MD;  Location: [facility];  Service: Urology;  Laterality: Right;  . CYSTOSCOPY WITH RETROGRADE PYELOGRAM, URETEROSCOPY AND STENT PLACEMENT Right 02/15/2017   Procedure: CYSTOSCOPY WITH RETROGRADE PYELOGRAM, URETEROSCOPY, STONE BASKETRY AND STENT REPLACEMENT;  Surgeon: Alexis Frock, MD;  Location: [facility];  Service: Urology;  Laterality: Right;  . CYSTOSCOPY/RETROGRADE/URETEROSCOPY/STONE EXTRACTION WITH BASKET  yrs ago  . DILATION AND CURETTAGE OF UTERUS    . HOLMIUM LASER APPLICATION Right 3/32/9518   Procedure: HOLMIUM LASER APPLICATION;  Surgeon: Alexis Frock, MD;  Location[facility];  Service: Urology;  Laterality: Right;  . KNEE SURGERY     right  . NEUROPLASTY / TRANSPOSITION ULNAR NERVE AT ELBOW Left 02-23-2000    [facility]   and Left Carpal Tunnel Release  . r eyelid cancer    . TONSILLECTOMY    . TRANSTHORACIC ECHOCARDIOGRAM  02/26/2016   moderate focal basal and mild concentric LVH,  ef 55-60%,  akinesis of the basal-midanteroseptal, inferoseptal and apicalinferior myocardium, study not sufficient to evaluation diastolic funciton due to atrial fib./  trivial PR/  mild TR  . UMBILICAL HERNIA REPAIR      Current Outpatient Medications  Medication Sig Dispense Refill  . acetaminophen (TYLENOL) 325 MG tablet Take 650 mg by mouth every 6 (six) hours.     [name] diltiazem (CARDIZEM CD) 120 MG 24 hr capsule Take 1 capsule (120 mg total) by mouth 2 (two) times daily. 180 capsule 3   . ELIQUIS 5 MG TABS tablet TAKE 1 TABLET BY MOUTH TWICE DAILY 60  tablet 6  . lidocaine (LIDODERM) 5 % Place 1 patch onto the skin daily. Remove & Discard patch within 12 hours or as directed by MD 30 patch 0  . Multiple Vitamins-Minerals (ICAPS AREDS 2) CAPS Take 2 capsules by mouth daily.     Vladimir Faster Glycol-Propyl Glycol (SYSTANE OP) Place 1 drop into both eyes daily as needed (itchy eyes).    . triamcinolone (NASACORT ALLERGY 24HR) 55 MCG/ACT AERO nasal inhaler Place 1-2 sprays into the nose 2 (two) times daily as needed (for rhinitis).     [name] azithromycin (ZITHROMAX) 250 MG tablet Take 1 tablet (250 mg total) by mouth daily. Take first 2 tablets together, then 1 every day until finished. (Patient not taking: Reported on 08/12/2018) 6 tablet 0  . diclofenac sodium (VOLTAREN) 1 % GEL Apply 2 g topically 4 (four) times daily. (Patient not taking: Reported on 08/12/2018) 1 Tube 0  . sennosides-docusate sodium (SENOKOT-S) 8.6-50 MG tablet Take 2 tablets by mouth at bedtime.    . traMADol (ULTRAM) 50 MG tablet Take 1 tablet (50 mg total) by mouth every 6 (six) hours as needed. (Patient not taking: Reported on 08/12/2018) 10 tablet 0  . valACYclovir (VALTREX) 1000 MG tablet Take 1 tablet (1,000 mg total) by mouth 3 (three) times daily. (Patient not taking: Reported on 08/12/2018) 21 tablet 0   No current facility-administered medications for this visit.     Allergies as of 08/12/2018 - Review Complete 08/12/2018  Allergen Reaction Noted  . Antihistamines, chlorpheniramine-type Other (See Comments) 02/25/2015  . Penicillins Anaphylaxis and Other (See Comments) 12/29/2008  . Contrast media [iodinated diagnostic agents] Hives 05/17/2013  . Sulfonamide derivatives Other (See Comments) 12/29/2008  . Atorvastatin Other (See Comments) 02/16/2016  . Pheniramine Other (See Comments) 09/25/2015    Vitals: BP (!) 143/88 (BP Location: Left Arm, Patient Position: Sitting)   Pulse 77   Ht 5' 5.5" (1.664 m)    Wt 162 lb (73.5 kg)   BMI 26.55 kg/m  Last Weight:  Wt Readings from Last 1 Encounters:  08/12/18 162 lb (73.5 kg)   Last Height:   Ht Readings from Last 1 Encounters:  08/12/18 5' 5.5" (1.664 m)     Physical exam: Exam: Gen: NAD, conversant, well nourised, obese, well groomed                     CV: irregular, no MRG. No Carotid Bruits. No peripheral edema, warm, nontender Eyes: Conjunctivae clear without exudates or hemorrhage  Neuro: Detailed Neurologic Exam  Speech:    Speech is normal; fluent and spontaneous with normal comprehension.  Cognition:    The patient is oriented to person, place, and time;     recent and remote memory impaired ;     language fluent;     Impaired attention, concentration,     fund of knowledge Cranial Nerves:    The pupils are equal, round, and reactive to light. Attempted fundoscopic exam could not visualize. Mild dysconjugate gaze(right eye elevation and exzotropia) but EOMI. Visual fields are full to finger confrontation. Trigeminal sensation is intact and the muscles of mastication are normal. The face is symmetric. The palate elevates in the midline. Hearing impaired. Voice is normal. Shoulder shrug is normal. The tongue has normal motion without fasciculations.   Coordination:    No dysmetria  Gait:    Using a cane  Posture:    Posture is normal. normal erect    Strength: Mild proximal weakness more  pronounced left deltoid and triceps 4/5.      Sensation: intact to LT and pinprick distally, decreased temp distally, absent vibration at the great toes     Reflex Exam:  DTR's:    Absent AJs Toes:    The toes are downgoing bilaterally.   Clonus:    Clonus is absent.     Assessment/Plan:  An absolutely lovely 85 y.o. female here as a referral for leg numbness.  Patient is here for numbness in the legs evaluation for peripheral neuropathy versus radiculopathy, likely due to both. Fortunately there is no pain and she is mobile  and active and driving. These changes in the feet may be permanent at this point  - patient's MRI of the lumbar spine shows significant changes, multilevel advanced degeneration  with multilevel foraminal impingement as well as moderate and severe spinal stenosis.  I am sure that this is contributing to her symptoms in her feet but she is walking great and she is active, I would not touch it.   - She has at least 2 major risk factors for polyneuropathy including vitamin B12 deficiency(not taking supplements or injections) and possibly elevated glucose(per notes, patient denies this). Luckily she denies any significant pain.  -At this point patient needs to manage her peripheral polyneuropathy risk factors such as B12 deficiency and prediabetes or any others we find.  Lumbosacral surgical intervention for stenosis is unlikely at her age and unneeded since she walks fine and is very active.   - We will check for other causes of peripheral polyneuropathy but at this point treatment is likely supportive and conservative.  - I highly encouraged her NOT to spend $5000 for treatment for her symptoms, unclear which clinic this is but I worry for this patient, do not want her to be taken advantage of.   Orders Placed This Encounter  Procedures  . B. burgdorfi Antibody  . Methylmalonic acid, serum  . Hemoglobin A1c  . Vitamin B1  . Vitamin B6  . TSH  . Sjogren's syndrome antibods(ssa + ssb)  . B12 and Folate Panel  . Heavy metals, blood  . Multiple Myeloma Panel (SPEP&IFE w/QIG)    Sarina Ill, MD  [facility] [address] [place], [place] [address]  Phone [phone] Fax [phone]  A total of 40 minutes was spent face-to-face with this patient. Over half this time was spent on counseling patient on the  1. Other polyneuropathy   2. Spinal stenosis of lumbar region with neurogenic claudication   3. Lumbosacral radiculopathy   4. B12 deficiency   5.  Vitamin B1 deficiency   6. Vitamin B6 deficiency   7. Elevated glucose   8. Fatigue, unspecified type    diagnosis and different diagnostic and therapeutic options, counseling and coordination of care, risks ans benefits of management, compliance, or risk factor reduction and education.

## 2018-08-11 NOTE — Telephone Encounter (Signed)
Patient no showed her new pt appt today.

## 2018-08-12 ENCOUNTER — Encounter: Payer: Self-pay | Admitting: Neurology

## 2018-08-12 ENCOUNTER — Ambulatory Visit (INDEPENDENT_AMBULATORY_CARE_PROVIDER_SITE_OTHER): Payer: Medicare Other | Admitting: Neurology

## 2018-08-12 VITALS — BP 143/88 | HR 77 | Ht 65.5 in | Wt 162.0 lb

## 2018-08-12 DIAGNOSIS — M5417 Radiculopathy, lumbosacral region: Secondary | ICD-10-CM

## 2018-08-12 DIAGNOSIS — E519 Thiamine deficiency, unspecified: Secondary | ICD-10-CM | POA: Diagnosis not present

## 2018-08-12 DIAGNOSIS — R5383 Other fatigue: Secondary | ICD-10-CM | POA: Diagnosis not present

## 2018-08-12 DIAGNOSIS — M48062 Spinal stenosis, lumbar region with neurogenic claudication: Secondary | ICD-10-CM | POA: Diagnosis not present

## 2018-08-12 DIAGNOSIS — G6289 Other specified polyneuropathies: Secondary | ICD-10-CM | POA: Diagnosis not present

## 2018-08-12 DIAGNOSIS — R7309 Other abnormal glucose: Secondary | ICD-10-CM | POA: Diagnosis not present

## 2018-08-12 DIAGNOSIS — E538 Deficiency of other specified B group vitamins: Secondary | ICD-10-CM | POA: Diagnosis not present

## 2018-08-12 DIAGNOSIS — E531 Pyridoxine deficiency: Secondary | ICD-10-CM

## 2018-08-12 DIAGNOSIS — G629 Polyneuropathy, unspecified: Secondary | ICD-10-CM | POA: Insufficient documentation

## 2018-08-12 NOTE — Patient Instructions (Signed)

## 2018-08-14 ENCOUNTER — Telehealth: Payer: Self-pay | Admitting: *Deleted

## 2018-08-14 NOTE — Telephone Encounter (Signed)
-----   Message from Melvenia Beam, MD sent at 08/14/2018 12:13 PM EDT ----- Patient has very mild pre-diabetes and probably a very mild B12 deficiency. Both can cause numbness in the feet. I would take an OTC B12 daily po long term maybe 1079mcg a day or a daily multi-B vitamin. Recheck B12 with pcp in a few months.

## 2018-08-14 NOTE — Telephone Encounter (Signed)
Spoke with patient and discussed her lab results and recommendations from Dr. Jaynee Eagles to start either OTC B12 1000 mcg oral daily or a multi-B vitamin oral daily. Also discussed that the patient has very mild prediabetes. Pt was advised to f/u in a few months with PCP to have her B12 rechecked. Pt verbalized understanding and appreciation. She will f/u with PCP. Labs faxed to Dr. Laurann Montana.

## 2018-08-16 LAB — SJOGREN'S SYNDROME ANTIBODS(SSA + SSB)
ENA SSA (RO) Ab: <0.2 AI (ref 0.0–0.9)
ENA SSB (LA) Ab: <0.2 AI (ref 0.0–0.9)

## 2018-08-16 LAB — VITAMIN B6: Vitamin B6: 5.6 ug/L (ref 2.0–32.8)

## 2018-08-16 LAB — MULTIPLE MYELOMA PANEL, SERUM
Albumin SerPl Elph-Mcnc: 3.7 g/dL (ref 2.9–4.4)
Albumin/Glob SerPl: 1.3 (ref 0.7–1.7)
Alpha 1: 0.2 g/dL (ref 0.0–0.4)
Alpha2 Glob SerPl Elph-Mcnc: 0.9 g/dL (ref 0.4–1.0)
B-Globulin SerPl Elph-Mcnc: 1 g/dL (ref 0.7–1.3)
Gamma Glob SerPl Elph-Mcnc: 0.8 g/dL (ref 0.4–1.8)
Globulin, Total: 2.9 g/dL (ref 2.2–3.9)
IgA/Immunoglobulin A, Serum: 319 mg/dL (ref 64–422)
IgG (Immunoglobin G), Serum: 785 mg/dL (ref 700–1600)
IgM (Immunoglobulin M), Srm: 31 mg/dL (ref 26–217)
M Protein SerPl Elph-Mcnc: 0.5 g/dL — ABNORMAL HIGH
Total Protein: 6.6 g/dL (ref 6.0–8.5)

## 2018-08-16 LAB — HEMOGLOBIN A1C
Est. average glucose Bld gHb Est-mCnc: 117 mg/dL
Hgb A1c MFr Bld: 5.7 % — ABNORMAL HIGH (ref 4.8–5.6)

## 2018-08-16 LAB — HEAVY METALS, BLOOD
Arsenic: 7 ug/L (ref 2–23)
Lead, Blood: 1 ug/dL (ref 0–4)
Mercury: NOT DETECTED ug/L (ref 0.0–14.9)

## 2018-08-16 LAB — B. BURGDORFI ANTIBODIES: Lyme IgG/IgM Ab: <0.91 {ISR} (ref 0.00–0.90)

## 2018-08-16 LAB — B12 AND FOLATE PANEL
Folate: 8.9 ng/mL (ref >3.0)
Vitamin B-12: 418 pg/mL (ref 232–1245)

## 2018-08-16 LAB — METHYLMALONIC ACID, SERUM: Methylmalonic Acid: 1388 nmol/L — ABNORMAL HIGH (ref 0–378)

## 2018-08-16 LAB — TSH: TSH: 1.75 u[IU]/mL (ref 0.450–4.500)

## 2018-08-16 LAB — VITAMIN B1: Thiamine: 116.2 nmol/L (ref 66.5–200.0)

## 2018-08-19 ENCOUNTER — Telehealth: Payer: Self-pay | Admitting: Neurology

## 2018-08-19 NOTE — Telephone Encounter (Signed)
Also in one lab we found an increase in a normal component of blood. This may not mean anything but I want Shelly Obrien to be evaluated by hematology for MGUS. Monoclonal gammopathy of undetermined significance (MGUS) is a condition in which an abnormal protein - known as monoclonal protein or M protein - is in your blood. The protein is produced in a type of white blood cell (plasma cells) in your bone marrow. MGUS usually causes no problems. But sometimes it can progress over years to other disorders, including some forms of blood cancer. It's important to have regular checkups to closely monitor monoclonal gammopathy so that if it does progress, you get earlier treatment. If there's no disease progression, MGUS doesn't require treatment.   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 Sending phone note

## 2018-08-20 ENCOUNTER — Other Ambulatory Visit: Payer: Self-pay | Admitting: *Deleted

## 2018-08-20 DIAGNOSIS — D472 Monoclonal gammopathy: Secondary | ICD-10-CM

## 2018-08-20 DIAGNOSIS — R899 Unspecified abnormal finding in specimens from other organs, systems and tissues: Secondary | ICD-10-CM

## 2018-08-20 NOTE — Telephone Encounter (Signed)
I spoke with the patient and discussed the results in detail from Dr. Jaynee Eagles and that we would be sending a referral to hematology. Her questions were answered. She agreed to the referral. She stated her doctor has ordered her to stay in for at least 2 weeks. I advised her that we will send the referral but will have to wait for them to call her anyway, and she can talk to them about when will be the best time to setup the appointment. She was appreciative. I advised pt not to be alarmed if the caller says they are from the cancer center as this is where the hematologists office is. She verbalized understanding and appreciation.   Referral placed to hematology.

## 2018-09-01 ENCOUNTER — Telehealth: Payer: Self-pay | Admitting: Internal Medicine

## 2018-09-01 ENCOUNTER — Telehealth: Payer: Self-pay | Admitting: Neurology

## 2018-09-01 ENCOUNTER — Institutional Professional Consult (permissible substitution): Payer: Medicare Other | Admitting: Neurology

## 2018-09-01 ENCOUNTER — Encounter: Payer: Self-pay | Admitting: Internal Medicine

## 2018-09-01 NOTE — Telephone Encounter (Signed)
Called Patient she is aware of her apt with Dr. Walden Field 09/22/2018. Arrive at 12:30 for 1:00 . I told patient about Valet Parking. Patient has number if  she needs to reschedule [phone] . Thanks Hinton Dyer.

## 2018-09-01 NOTE — Telephone Encounter (Signed)
A new hem appt has been scheduled for the pt to see Dr. Walden Field on 4/20 at 1pm. Letter mailed to the pt and msg sent to the referring to notify the pt.

## 2018-09-08 ENCOUNTER — Telehealth: Payer: Self-pay | Admitting: Internal Medicine

## 2018-09-08 NOTE — Telephone Encounter (Signed)
Received a call from the pt's POA to reschedule appt w/Dr. Walden Field. Appt rescheduled to 5/13 at 1pm.

## 2018-09-17 ENCOUNTER — Ambulatory Visit (HOSPITAL_COMMUNITY)
Admission: RE | Admit: 2018-09-17 | Discharge: 2018-09-17 | Disposition: A | Discharge status: Home / Self Care | Payer: Medicare Other | Source: Ambulatory Visit | Attending: Nurse Practitioner | Admitting: Nurse Practitioner

## 2018-09-17 ENCOUNTER — Other Ambulatory Visit: Payer: Self-pay

## 2018-09-17 ENCOUNTER — Encounter (HOSPITAL_COMMUNITY): Payer: Self-pay | Admitting: Nurse Practitioner

## 2018-09-17 ENCOUNTER — Telehealth (HOSPITAL_COMMUNITY): Payer: Self-pay | Admitting: *Deleted

## 2018-09-17 VITALS — BP 128/90 | HR 81 | Ht 65.5 in

## 2018-09-17 DIAGNOSIS — R21 Rash and other nonspecific skin eruption: Secondary | ICD-10-CM

## 2018-09-17 DIAGNOSIS — I480 Paroxysmal atrial fibrillation: Secondary | ICD-10-CM

## 2018-09-17 NOTE — Progress Notes (Signed)
Electrophysiology TeleHealth Note   Due to national recommendations of social distancing due to [place] 19, Audio telehealth visit is felt to be most appropriate for this patient at this time.  See MyChart message/consent below from today for patient consent regarding telehealth for the [facility].    Date:  09/17/2018   ID:  Shelly Obrien, DOB 07-26-1922, MRN [id number]  Location: home  Provider location: [address] [place], [place] [address]   PCP:  [name], MD  Primary Cardiologist:  Dr. Cathie Olden Primary Electrophysiologist:  none   OI:NOMV   History of Present Illness: Shelly Obrien is a 85 y.o. female who presents via audio conferencing for a telehealth visit today.  Her complaint is that of rash. I have not seen her since 06/2017. This rash is on gong for over one year. At one time pt thought it was from her DOAC. She was initially on xarelto, thought this caused GI concerns  and was switched to eliquis. She then c/o a rash and was rx'ed pradaxa 05/2017. However, she did not take for the cost. The rash has been tolerable but she feels is worse for the last month as she says  her PCP changed her diltiazem to a different form of CCB to be less expensive. She reports no other change in meds.  She did not  tolerate BB in past. She does have afib with RVR at times so he does need some type of rate control.She has seen a dermatologist for rhe rash in the  recent past and did at one time receive a cream that was helpful.   Today, she denies symptoms of palpitations, chest pain, shortness of breath, orthopnea, PND. She states no lower extremity edema, claudication, dizziness, presyncope, syncope, bleeding, or neurologic sequela.   she denies symptoms of cough, fevers, chills, or new SOB worrisome for COVID 19.     Atrial Fibrillation Risk Factors:  she does not have symptoms or diagnosis of sleep apnea. she does not have a history of rheumatic fever. she does not  have a history of alcohol use. The patient does not have a history of early familial atrial fibrillation or other arrhythmias.  she has a BMI of Body mass index is 26.55 kg/m.[name] There were no vitals filed for this visit.  Past Medical History:  Diagnosis Date   Aortic ectasia (HCC)    Arm fracture 2013   right, Dr. Sharen Heck   B12 deficiency    CAD S/P percutaneous coronary angioplasty    hx MI 04/ 1994  s/p  PCI to LAD   CKD (chronic kidney disease), stage III ([place])    Complication of anesthesia    hard to wake    Gallstones    History of basal cell carcinoma (BCC) excision    right eye area 08/ 2013   History of kidney stones 01/20/2017   History of MI (myocardial infarction) 09/1992   s/p  PCI to LAD   History of squamous cell carcinoma excision    right lower leg 2015 ;  2016   Hx of colonic polyps    Hyperlipidemia    Hypertension    Macular degeneration    Osteoporosis    PAF (paroxysmal atrial fibrillation) ([place]) dx 02-24-2016   followed by cardiologist-- dr Lizbeth Bark   Pernicious anemia    PONV (postoperative nausea and vomiting)    Rib fracture 07/2018   left   Right ureteral stone  Wears glasses    Past Surgical History:  Procedure Laterality Date   ABDOMINAL HYSTERECTOMY     BREAST EXCISIONAL BIOPSY Left 11/02/2009   fibroadenoma (lumpectomy)   BROW LIFT Right 04/07/2014   Procedure: REPAIR OF ENTROPION AND TRICHIASIS OF RIGHT LOWER EYE LID ;  Surgeon: Theodoro Kos, DO;  Location: [facility];  Service: Plastics;  Laterality: Right;   CARDIOVERSION N/A 07/18/2017   Procedure: CARDIOVERSION;  Surgeon: Skeet Latch, MD;  Location: [facility];  Service: Cardiovascular;  Laterality: N/A;   CATARACT EXTRACTION W/ INTRAOCULAR LENS  IMPLANT, BILATERAL     CHOLECYSTECTOMY N/A 12/06/2017   Procedure: LAPAROSCOPIC CHOLECYSTECTOMY;  Surgeon: Jovita Kussmaul, MD;  [facility];  Service: General;  Laterality: N/A;    CORONARY ANGIOPLASTY  04/ 1994  dr Lia Foyer   PCI to LAD   CORONARY ANGIOPLASTY     CYSTOSCOPY W/ URETERAL STENT PLACEMENT Right 01/20/2017   Procedure: CYSTOSCOPY WITH RIGHT RETROGRADE PYELOGRAM/RIGHT URETERAL STENT PLACEMENT;  Surgeon: Alexis Frock, MD;  [facility];  Service: Urology;  Laterality: Right;   CYSTOSCOPY WITH RETROGRADE PYELOGRAM, URETEROSCOPY AND STENT PLACEMENT Right 02/15/2017   Procedure: CYSTOSCOPY WITH RETROGRADE PYELOGRAM, URETEROSCOPY, STONE BASKETRY AND STENT REPLACEMENT;  Surgeon: Alexis Frock, MD;  Location[facility];  Service: Urology;  Laterality: Right;   CYSTOSCOPY/RETROGRADE/URETEROSCOPY/STONE EXTRACTION WITH BASKET  yrs ago   DILATION AND CURETTAGE OF UTERUS     HOLMIUM LASER APPLICATION Right 4/81/8563   Procedure: HOLMIUM LASER APPLICATION;  Surgeon: Alexis Frock, MD;  [facility];  Service: Urology;  Laterality: Right;   KNEE SURGERY     right   NEUROPLASTY / TRANSPOSITION ULNAR NERVE AT ELBOW Left 02-23-2000    [facility]   and Left Carpal Tunnel Release   r eyelid cancer     TONSILLECTOMY     TRANSTHORACIC ECHOCARDIOGRAM  02/26/2016   moderate focal basal and mild concentric LVH,  ef 55-60%,  akinesis of the basal-midanteroseptal, inferoseptal and apicalinferior myocardium, study not sufficient to evaluation diastolic funciton due to atrial fib./  trivial PR/  mild TR   UMBILICAL HERNIA REPAIR       Current Outpatient Medications  Medication Sig Dispense Refill   diltiazem (CARDIZEM CD) 120 MG 24 hr capsule Take 1 capsule (120 mg total) by mouth 2 (two) times daily. 180 capsule 3   ELIQUIS 5 MG TABS tablet TAKE 1 TABLET BY MOUTH TWICE DAILY 60 tablet 6   No current facility-administered medications for this encounter.     Allergies:   Antihistamines, chlorpheniramine-type; Penicillins; Contrast media [iodinated diagnostic agents]; Sulfonamide derivatives; Atorvastatin; and Pheniramine    Social History:  The patient  reports that she has quit smoking. Her smoking use included cigarettes. She quit after 5.00 years of use. She has never used smokeless tobacco. She reports that she does not drink alcohol or use drugs.   Family History:  The patient's  family history includes Brain cancer in her son; Cancer in an other family member; Coronary artery disease in an other family member; Liver cancer in her daughter.    ROS:  Please see the history of present illness.   All other systems are personally reviewed and negative.   Exam: NA  Recent Labs: 03/09/2018: Hemoglobin 13.1; Platelets 200 05/07/2018: ALT 13; BUN 15; Creatinine, Ser 1.16; Potassium 4.8; Sodium 142 08/12/2018: TSH 1.750  personally reviewed    Other studies personally reviewed: Epic records reviewed    ASSESSMENT AND PLAN:  1.  Rash- acute  on chronic Feels that the change in diltiazem in the last month has made her itch more She will call the drug store and go back on previous form of CCB She will call her dermatologist and get refill on her cream that helped with rash in the past  If the rash does not abate, can further discuss change to pradaxa from eliquis. The two drugs are tho only rx meds she is  On  2. . Paroxysmal atrial fibrillation Currently is quiet   This patients CHA2DS2-VASc Score and unadjusted Ischemic Stroke Rate (% per year) is equal to 7.2 % stroke rate/year from a score of 5  Above score calculated as 1 point each if present [CHF, HTN, DM, Vascular=MI/PAD/Aortic Plaque, Obrien if 65-74, or Female] Above score calculated as 2 points each if present [Obrien > 75, or Stroke/TIA/TE]  2. COVID screen The patient does not have any symptoms that suggest any further testing/ screening at this time.  Social distancing reinforced today   Follow-up:  2 week by  telephone call  Current medicines are reviewed at length with the patient today.   The patient has concerns regarding her  medicines.  The following changes were made today:  Go back to previous CCB  form  Labs/ tests ordered today include:none No orders of the defined types were placed in this encounter.   Patient Risk:  after full review of this patients clinical status, I feel that they are at highrisk at this time.   Today, I have spent 15 minutes with the patient with telehealth technology discussing rash    Signed, Roderic Palau NP  09/17/2018 1:50 PM  Afib [facility] [place], [place] [address] [phone]   I hereby voluntarily request, consent and authorize the [facility] and its employed or contracted physicians, physician assistants, nurse practitioners or other licensed health care professionals (the Practitioner), to provide me with telemedicine health care services (the "Services") as deemed necessary by the treating Practitioner. I acknowledge and consent to receive the Services by the Practitioner via telemedicine. I understand that the telemedicine visit will involve communicating with the Practitioner through live audiovisual communication technology and the disclosure of certain medical information by electronic transmission. I acknowledge that I have been given the opportunity to request an in-person assessment or other available alternative prior to the telemedicine visit and am voluntarily participating in the telemedicine visit.   I understand that I have the right to withhold or withdraw my consent to the use of telemedicine in the course of my care at any time, without affecting my right to future care or treatment, and that the Practitioner or I may terminate the telemedicine visit at any time. I understand that I have the right to inspect all information obtained and/or recorded in the course of the telemedicine visit and may receive copies of available information for a reasonable fee.  I understand that some of the potential risks of  receiving the Services via telemedicine include:   Delay or interruption in medical evaluation due to technological equipment failure or disruption;  Information transmitted may not be sufficient (e.g. poor resolution of images) to allow for appropriate medical decision making by the Practitioner; and/or  In rare instances, security protocols could fail, causing a breach of personal health information.   Furthermore, I acknowledge that it is my responsibility to provide information about my medical history, conditions and care that is complete and accurate  to the best of my ability. I acknowledge that Practitioner's advice, recommendations, and/or decision may be based on factors not within their control, such as incomplete or inaccurate data provided by me or distortions of diagnostic images or specimens that may result from electronic transmissions. I understand that the practice of medicine is not an exact science and that Practitioner makes no warranties or guarantees regarding treatment outcomes. I acknowledge that I will receive a copy of this consent concurrently upon execution via email to the email address I last provided but may also request a printed copy by calling the office of the [facility].  I understand that my insurance will be billed for this visit.   I have read or had this consent read to me.  I understand the contents of this consent, which adequately explains the benefits and risks of the Services being provided via telemedicine.  I have been provided ample opportunity to ask questions regarding this consent and the Services and have had my questions answered to my satisfaction.  I give my informed consent for the services to be provided through the use of telemedicine in my medical care  By participating in this telemedicine visit I agree to the above.

## 2018-09-17 NOTE — Telephone Encounter (Signed)
Patient called in stating she is having continued issues with an itchy rash - which was documented over a year ago as well. Pt states she just cannot tolerate it anymore she isn't sure if its the cardizem or the eliquis. She actually held her cardizem yesterday but her HRs were in the 120s so she took it today (didn't see improvement with holding only one day) then she also held the eliquis for an entire day but restarted once her HR was elevated. Will set pt up with telephone visit with Roderic Palau to determine best course of treatment for medication changes. Pt states she cannot sleep b/c of the rash/itching constantly.

## 2018-09-22 ENCOUNTER — Encounter: Payer: Medicare Other | Admitting: Internal Medicine

## 2018-10-01 ENCOUNTER — Other Ambulatory Visit (HOSPITAL_COMMUNITY): Payer: Self-pay | Admitting: *Deleted

## 2018-10-01 ENCOUNTER — Other Ambulatory Visit: Payer: Self-pay

## 2018-10-01 ENCOUNTER — Encounter (HOSPITAL_COMMUNITY): Payer: Self-pay | Admitting: Nurse Practitioner

## 2018-10-01 ENCOUNTER — Ambulatory Visit (HOSPITAL_COMMUNITY)
Admission: RE | Admit: 2018-10-01 | Discharge: 2018-10-01 | Disposition: A | Discharge status: Home / Self Care | Payer: Medicare Other | Source: Ambulatory Visit | Attending: Nurse Practitioner | Admitting: Nurse Practitioner

## 2018-10-01 DIAGNOSIS — R21 Rash and other nonspecific skin eruption: Secondary | ICD-10-CM | POA: Diagnosis not present

## 2018-10-01 MED ORDER — METOPROLOL SUCCINATE ER 25 MG PO TB24: 25.0000 mg | ORAL_TABLET | Freq: Every day | ORAL | 3 refills | Status: DC

## 2018-10-01 NOTE — Progress Notes (Signed)
Electrophysiology TeleHealth Note   Due to national recommendations of social distancing due to [place] 19, Audio/video  telehealth visit is felt to be most appropriate for this patient at this time.  See MyChart message/consent below  from today for patient consent regarding telehealth for the At[facility] Fibrillation Clinic.    Date:  10/01/2018   ID:  Shelly Obrien, DOB [birth year], MRN [id number]  Location: home  Provider location: [address] [place], [place] [address] Evaluation Performed: Follow up  PCP:  [name], MD  Primary Cardiologist: [name] Primary Electrophysiologist: afib clinic  CC: rash   History of Present Illness: Shelly Obrien is a 85 y.o. female who presents via audio/video conferencing for a telehealth visit today.     This is a f/u on visit from last week for rash. Pt has had for over a year. Has seen a dermatologist for it in the past and has gotten a "cream."  She felt it was a change in diltiazem to another generic in the last month.She talked to pharmacist and he did not feel this was an issue. She was able to get some more cream which  is helping. In the past she questioned DOAC and ws told to switch to pradaxa which she has not done for cost. She still feels it is the diltiazem. There is a note in a note in the past  that stated she did not tolerate BB's but she is willing to try again. It is not listed on her allergy list. She does need rate control med on board.   Today, she denies symptoms of palpitations, chest pain, shortness of breath, orthopnea, PND, lower extremity edema, claudication, dizziness, presyncope, syncope, bleeding, or neurologic sequela.+ rash. The patient is tolerating medications without difficulties and is otherwise without complaint today.   she denies symptoms of cough, fevers, chills, or new SOB worrisome for COVID 19.   she has a BMI of Body mass index is 26.55 kg/m.Shelly Obrien   Past Medical History:  Diagnosis Date  . Aortic ectasia  ([place])   . Arm fracture 2013   right, Dr. Sharen Heck  . B12 deficiency   . CAD S/P percutaneous coronary angioplasty    hx MI 04/ 1994  s/p  PCI to LAD  . CKD (chronic kidney disease), stage III ([place])   . Complication of anesthesia    hard to wake   . Gallstones   . History of basal cell carcinoma (BCC) excision    right eye area 08/ 2013  . History of kidney stones 01/20/2017  . History of MI (myocardial infarction) 09/1992   s/p  PCI to LAD  . History of squamous cell carcinoma excision    right lower leg 2015 ;  2016  . Hx of colonic polyps   . Hyperlipidemia   . Hypertension   . Macular degeneration   . Osteoporosis   . PAF (paroxysmal atrial fibrillation) ([place]) dx 02-24-2016   followed by cardiologist-- dr Mamie Nick. [name]  . Pernicious anemia   . PONV (postoperative nausea and vomiting)   . Rib fracture 07/2018   left  . Right ureteral stone   . Wears glasses    Past Surgical History:  Procedure Laterality Date  . ABDOMINAL HYSTERECTOMY    . BREAST EXCISIONAL BIOPSY Left 11/02/2009   fibroadenoma (lumpectomy)  . BROW LIFT Right 04/07/2014   Procedure: REPAIR OF ENTROPION AND TRICHIASIS OF RIGHT LOWER EYE LID ;  Surgeon: Theodoro Kos, DO;  Location:  [facility];  Service: Plastics;  Laterality: Right;  . CARDIOVERSION N/A 07/18/2017   Procedure: CARDIOVERSION;  Surgeon: Skeet Latch, MD;  Location: [facility];  Service: Cardiovascular;  Laterality: N/A;  . CATARACT EXTRACTION W/ INTRAOCULAR LENS  IMPLANT, BILATERAL    . CHOLECYSTECTOMY N/A 12/06/2017   Procedure: LAPAROSCOPIC CHOLECYSTECTOMY;  Surgeon: Jovita Kussmaul, MD;  [facility];  Service: General;  Laterality: N/A;  . CORONARY ANGIOPLASTY  04/ 1994  dr Lia Foyer   PCI to LAD  . CORONARY ANGIOPLASTY    . CYSTOSCOPY W/ URETERAL STENT PLACEMENT Right 01/20/2017   Procedure: CYSTOSCOPY WITH RIGHT RETROGRADE PYELOGRAM/RIGHT URETERAL STENT PLACEMENT;  Surgeon: Alexis Frock, MD;  [facility];   Service: Urology;  Laterality: Right;  . CYSTOSCOPY WITH RETROGRADE PYELOGRAM, URETEROSCOPY AND STENT PLACEMENT Right 02/15/2017   Procedure: CYSTOSCOPY WITH RETROGRADE PYELOGRAM, URETEROSCOPY, STONE BASKETRY AND STENT REPLACEMENT;  Surgeon: Alexis Frock, MD;  Location: [facility];  Service: Urology;  Laterality: Right;  . CYSTOSCOPY/RETROGRADE/URETEROSCOPY/STONE EXTRACTION WITH BASKET  yrs ago  . DILATION AND CURETTAGE OF UTERUS    . HOLMIUM LASER APPLICATION Right 02/02/5175   Procedure: HOLMIUM LASER APPLICATION;  Surgeon: Alexis Frock, MD;  Location: [facility];  Service: Urology;  Laterality: Right;  . KNEE SURGERY     right  . NEUROPLASTY / TRANSPOSITION ULNAR NERVE AT ELBOW Left 02-23-2000    [facility]   and Left Carpal Tunnel Release  . r eyelid cancer    . TONSILLECTOMY    . TRANSTHORACIC ECHOCARDIOGRAM  02/26/2016   moderate focal basal and mild concentric LVH,  ef 55-60%,  akinesis of the basal-midanteroseptal, inferoseptal and apicalinferior myocardium, study not sufficient to evaluation diastolic funciton due to atrial fib./  trivial PR/  mild TR  . UMBILICAL HERNIA REPAIR       Current Outpatient Medications  Medication Sig Dispense Refill  . diltiazem (CARDIZEM CD) 120 MG 24 hr capsule Take 1 capsule (120 mg total) by mouth 2 (two) times daily. 180 capsule 3  . ELIQUIS 5 MG TABS tablet TAKE 1 TABLET BY MOUTH TWICE DAILY 60 tablet 6  . Multiple Vitamins-Minerals (ICAPS) CAPS Take by mouth.     No current facility-administered medications for this encounter.     Allergies:   Antihistamines, chlorpheniramine-type; Penicillins; Contrast media [iodinated diagnostic agents]; Sulfonamide derivatives; Atorvastatin; and Pheniramine   Social History:  The patient  reports that she has quit smoking. Her smoking use included cigarettes. She quit after 5.00 years of use. She has never used smokeless tobacco. She reports that she does not drink alcohol or  use drugs.   Family History:  The patient's  family history includes Brain cancer in her son; Cancer in an other family member; Coronary artery disease in an other family member; Liver cancer in her daughter.    ROS:  Please see the history of present illness.   All other systems are personally reviewed and negative.   Exam: NA  Recent Labs: 03/09/2018: Hemoglobin 13.1; Platelets 200 05/07/2018: ALT 13; BUN 15; Creatinine, Ser 1.16; Potassium 4.8; Sodium 142 08/12/2018: TSH 1.750  personally reviewed       ASSESSMENT AND PLAN:  1. Rash Will stop diltiazem and start metoprolol ER 25 mg at suppertime IF this does not relieve rash may have to revisit changing DOAC She is also gong to be seen by [facility] 5/13 for blood test questionable for multiple myeloma, any relationship  for rash ?   COVID screen  The patient does not have any symptoms that suggest any further testing/ screening at this time.  Social distancing reinforced today.    Follow-up:   5/14 [facility]  telephone visit for improvement in rash  Current medicines are reviewed at length with the patient today.   The patient has concerns regarding her medicines.  The following changes were made today.. stop dilt, start BB   Labs/ tests ordered today include:none No orders of the defined types were placed in this encounter.   Patient Risk:  after full review of this patients clinical status, I feel that they are at high risk at this time.   Today, I have spent 10 minutes with the patient with telehealth technology discussing  .    Eduard Roux NP 10/01/2018 11:44 AM  Afib [facility] [address] [place], [place] [address] [phone]   I hereby voluntarily request, consent and authorize the [facility] and its employed or contracted physicians, physician assistants, nurse practitioners or other licensed health care professionals (the Practitioner), to provide me with  telemedicine health care services (the "Services") as deemed necessary by the treating Practitioner. I acknowledge and consent to receive the Services by the Practitioner via telemedicine. I understand that the telemedicine visit will involve communicating with the Practitioner through live audiovisual communication technology and the disclosure of certain medical information by electronic transmission. I acknowledge that I have been given the opportunity to request an in-person assessment or other available alternative prior to the telemedicine visit and am voluntarily participating in the telemedicine visit.   I understand that I have the right to withhold or withdraw my consent to the use of telemedicine in the course of my care at any time, without affecting my right to future care or treatment, and that the Practitioner or I may terminate the telemedicine visit at any time. I understand that I have the right to inspect all information obtained and/or recorded in the course of the telemedicine visit and may receive copies of available information for a reasonable fee.  I understand that some of the potential risks of receiving the Services via telemedicine include:   Delay or interruption in medical evaluation due to technological equipment failure or disruption;  Information transmitted may not be sufficient (e.g. poor resolution of images) to allow for appropriate medical decision making by the Practitioner; and/or  In rare instances, security protocols could fail, causing a breach of personal health information.   Furthermore, I acknowledge that it is my responsibility to provide information about my medical history, conditions and care that is complete and accurate to the best of my ability. I acknowledge that Practitioner's advice, recommendations, and/or decision may be based on factors not within their control, such as incomplete or inaccurate data provided by me or distortions of diagnostic images  or specimens that may result from electronic transmissions. I understand that the practice of medicine is not an exact science and that Practitioner makes no warranties or guarantees regarding treatment outcomes. I acknowledge that I will receive a copy of this consent concurrently upon execution via email to the email address I last provided but may also request a printed copy by calling the office of the [facility].  I understand that my insurance will be billed for this visit.   I have read or had this consent read to me.  I understand the contents of this consent, which adequately explains the benefits and risks of the Services being provided  via telemedicine.  I have been provided ample opportunity to ask questions regarding this consent and the Services and have had my questions answered to my satisfaction.  I give my informed consent for the services to be provided through the use of telemedicine in my medical care  By participating in this telemedicine visit I agree to the above.

## 2018-10-09 ENCOUNTER — Telehealth: Payer: Self-pay

## 2018-10-09 DIAGNOSIS — H3554 Dystrophies primarily involving the retinal pigment epithelium: Secondary | ICD-10-CM | POA: Diagnosis not present

## 2018-10-09 DIAGNOSIS — H353211 Exudative age-related macular degeneration, right eye, with active choroidal neovascularization: Secondary | ICD-10-CM | POA: Diagnosis not present

## 2018-10-09 DIAGNOSIS — H35363 Drusen (degenerative) of macula, bilateral: Secondary | ICD-10-CM | POA: Diagnosis not present

## 2018-10-09 DIAGNOSIS — H353231 Exudative age-related macular degeneration, bilateral, with active choroidal neovascularization: Secondary | ICD-10-CM | POA: Diagnosis not present

## 2018-10-09 NOTE — Telephone Encounter (Signed)
Left message for pt to call back about options for upcoming appt. 

## 2018-10-10 ENCOUNTER — Telehealth: Payer: Self-pay | Admitting: Cardiovascular Disease

## 2018-10-10 NOTE — Telephone Encounter (Signed)
New Message ° ° ° °Pt is returning call  ° ° ° °Please call back  °

## 2018-10-14 NOTE — Telephone Encounter (Signed)
Spoke with pt about upcoming appt. She denies any complications or issues at this time. Pt preferred to be rescheduled out to September and was advised to reach out to [place] if anything changes.

## 2018-10-15 ENCOUNTER — Other Ambulatory Visit: Payer: Self-pay

## 2018-10-15 ENCOUNTER — Inpatient Hospital Stay: Payer: Medicare Other

## 2018-10-15 ENCOUNTER — Inpatient Hospital Stay: Payer: Medicare Other | Attending: Internal Medicine | Admitting: Internal Medicine

## 2018-10-15 VITALS — BP 140/78 | HR 72 | Temp 97.5°F | Resp 17 | Ht 65.5 in | Wt 162.2 lb

## 2018-10-15 DIAGNOSIS — Z9049 Acquired absence of other specified parts of digestive tract: Secondary | ICD-10-CM | POA: Diagnosis not present

## 2018-10-15 DIAGNOSIS — W19XXXA Unspecified fall, initial encounter: Secondary | ICD-10-CM | POA: Diagnosis not present

## 2018-10-15 DIAGNOSIS — Z79899 Other long term (current) drug therapy: Secondary | ICD-10-CM | POA: Diagnosis not present

## 2018-10-15 DIAGNOSIS — Z880 Allergy status to penicillin: Secondary | ICD-10-CM | POA: Insufficient documentation

## 2018-10-15 DIAGNOSIS — G629 Polyneuropathy, unspecified: Secondary | ICD-10-CM | POA: Insufficient documentation

## 2018-10-15 DIAGNOSIS — N183 Chronic kidney disease, stage 3 (moderate): Secondary | ICD-10-CM | POA: Diagnosis not present

## 2018-10-15 DIAGNOSIS — Z809 Family history of malignant neoplasm, unspecified: Secondary | ICD-10-CM | POA: Insufficient documentation

## 2018-10-15 DIAGNOSIS — I4891 Unspecified atrial fibrillation: Secondary | ICD-10-CM

## 2018-10-15 DIAGNOSIS — I129 Hypertensive chronic kidney disease with stage 1 through stage 4 chronic kidney disease, or unspecified chronic kidney disease: Secondary | ICD-10-CM | POA: Insufficient documentation

## 2018-10-15 DIAGNOSIS — I480 Paroxysmal atrial fibrillation: Secondary | ICD-10-CM | POA: Diagnosis not present

## 2018-10-15 DIAGNOSIS — Z9181 History of falling: Secondary | ICD-10-CM

## 2018-10-15 DIAGNOSIS — Z882 Allergy status to sulfonamides status: Secondary | ICD-10-CM | POA: Insufficient documentation

## 2018-10-15 DIAGNOSIS — I10 Essential (primary) hypertension: Secondary | ICD-10-CM

## 2018-10-15 DIAGNOSIS — Z87891 Personal history of nicotine dependence: Secondary | ICD-10-CM | POA: Diagnosis not present

## 2018-10-15 DIAGNOSIS — Z8249 Family history of ischemic heart disease and other diseases of the circulatory system: Secondary | ICD-10-CM | POA: Insufficient documentation

## 2018-10-15 DIAGNOSIS — I252 Old myocardial infarction: Secondary | ICD-10-CM | POA: Insufficient documentation

## 2018-10-15 DIAGNOSIS — I2510 Atherosclerotic heart disease of native coronary artery without angina pectoris: Secondary | ICD-10-CM | POA: Insufficient documentation

## 2018-10-15 DIAGNOSIS — D472 Monoclonal gammopathy: Secondary | ICD-10-CM | POA: Insufficient documentation

## 2018-10-15 DIAGNOSIS — Z808 Family history of malignant neoplasm of other organs or systems: Secondary | ICD-10-CM | POA: Diagnosis not present

## 2018-10-15 DIAGNOSIS — Z800 Family history of malignant neoplasm of digestive organs: Secondary | ICD-10-CM | POA: Insufficient documentation

## 2018-10-15 DIAGNOSIS — Z7901 Long term (current) use of anticoagulants: Secondary | ICD-10-CM | POA: Diagnosis not present

## 2018-10-15 DIAGNOSIS — E538 Deficiency of other specified B group vitamins: Secondary | ICD-10-CM | POA: Diagnosis not present

## 2018-10-15 LAB — CMP (CANCER CENTER ONLY)
ALT: 12 U/L (ref 0–44)
AST: 19 U/L (ref 15–41)
Albumin: 3.7 g/dL (ref 3.5–5.0)
Alkaline Phosphatase: 120 U/L (ref 38–126)
Anion gap: 8 (ref 5–15)
BUN: 11 mg/dL (ref 8–23)
CO2: 25 mmol/L (ref 22–32)
Calcium: 9.7 mg/dL (ref 8.9–10.3)
Chloride: 109 mmol/L (ref 98–111)
Creatinine: 1.09 mg/dL — ABNORMAL HIGH (ref 0.44–1.00)
GFR, Est AFR Am: 50 mL/min — ABNORMAL LOW (ref >60)
GFR, Estimated: 43 mL/min — ABNORMAL LOW (ref >60)
Glucose, Bld: 99 mg/dL (ref 70–99)
Potassium: 4.1 mmol/L (ref 3.5–5.1)
Sodium: 142 mmol/L (ref 135–145)
Total Bilirubin: 0.7 mg/dL (ref 0.3–1.2)
Total Protein: 6.8 g/dL (ref 6.5–8.1)

## 2018-10-15 LAB — CBC WITH DIFFERENTIAL (CANCER CENTER ONLY)
Abs Immature Granulocytes: 0.01 10*3/uL (ref 0.00–0.07)
Basophils Absolute: 0 10*3/uL (ref 0.0–0.1)
Basophils Relative: 0 %
Eosinophils Absolute: 0.1 10*3/uL (ref 0.0–0.5)
Eosinophils Relative: 2 %
HCT: 41 % (ref 36.0–46.0)
Hemoglobin: 12.5 g/dL (ref 12.0–15.0)
Immature Granulocytes: 0 %
Lymphocytes Relative: 24 %
Lymphs Abs: 1.5 10*3/uL (ref 0.7–4.0)
MCH: 28.1 pg (ref 26.0–34.0)
MCHC: 30.5 g/dL (ref 30.0–36.0)
MCV: 92.1 fL (ref 80.0–100.0)
Monocytes Absolute: 0.5 10*3/uL (ref 0.1–1.0)
Monocytes Relative: 9 %
Neutro Abs: 4 10*3/uL (ref 1.7–7.7)
Neutrophils Relative %: 65 %
Platelet Count: 210 10*3/uL (ref 150–400)
RBC: 4.45 MIL/uL (ref 3.87–5.11)
RDW: 15.3 % (ref 11.5–15.5)
WBC Count: 6.1 10*3/uL (ref 4.0–10.5)
nRBC: 0 % (ref 0.0–0.2)

## 2018-10-15 LAB — LACTATE DEHYDROGENASE: LDH: 262 U/L — ABNORMAL HIGH (ref 98–192)

## 2018-10-15 NOTE — Progress Notes (Signed)
Referring Physician:  Dr. Jaynee Eagles of [facility] Diagnosis Monoclonal gammopathy - Plan: CBC with Differential ([place] Only), CMP ([place] only), Lactate dehydrogenase (LDH), SPEP with reflex to IFE, Kappa/lambda light chains, QIG  (Quant. immunoglobulins  - IgG, IgA, IgM)  Staging Cancer Staging No matching staging information was found for the patient.  Assessment and Plan:  1.  Monoclonal gammopathy of undetermined significance (MGUS).  85 year old female referred for evaluation due to monoclonal gammopathy.  Pt denies any symptoms other than occasional joint aches that she reports are due to aging and need for knee replacement.  She had labs done 08/12/2018 that showed SPEP with m spike measuring 0.5 g/dl IG A monoclonal protein with lambda light chain.  She had normal Quant IG.  MMA elevated at 1388.  Chemistries done 05/07/2018 showed K+ 4.8 Cr 1.16 Calcium 10 and normal LFTs.  Pt had rib xray 07/28/2018 after fall that showed left tenth rib fracture.    Pt is seen today for consultation due to monoclonal gammopathy.    Labs done today 10/15/2018 reviewed and showed WBC 6.1 HB 12.5 plts 210,000.  Chemistries WNL with K+ 4.1 Cr 1 and normal LFTs and calcium level.    Long talk held with pt and her son via phone.  I discussed with them she has minimal M spike of 0.5 g/dl with no RI, anemia or elevated calcium.  I have discussed with them findings are consistent with Monoclonal  Gammopathy of undetermined significance ( MGUS).  I have provided them written information regarding diagnosis.  Pt will have phone visit follow-up in 2 weeks to go over results.  Pt is recommended for observation pending remaining lab review. All questions answered and pt and family expressed understanding of information presented.    2.  B12 deficiency.  MMA elevated at 1388 which is consistent with diagnosis.  Pt should continue B12 supplementation.    3.  Fall.  Pt had rib fracture noted on  xray done 07/2018.  Follow-up with PCP if recurrent symptoms.    4.  Hypertension.  BP is 140/78.  Follow-up with PCP for monitoring.    5.  Atrial fibrillation.  Pt currently on Eliquis.  Follow-up with PCP or cardiology as directed.    40 minutes spent with more than 50% spent in review of records, counseling and coordination of care.    HPI:  85 year old female referred for evaluation due to monoclonal gammopathy.  Pt denies any symptoms other than occasional joint aches that she reports are due to aging and need for knee replacement.   She had labs done 08/12/2018 that showed SPEP with m spike measuring 0.5 g/dl IG A monoclonal protein with lambda light chain.  She had normal Quant IG.  MMA elevated at 1388.  Chemistries done 05/07/2018 showed K+ 4.8 Cr 1.16 Calcium 10 and normal LFTs.  Pt had rib xray 07/28/2018 after fall that showed left tenth rib fracture.    Pt is seen today for consultation due to monoclonal gammopathy.     Problem List Patient Active Problem List   Diagnosis Date Noted  . Peripheral neuropathy [G62.9] 08/12/2018  . B12 deficiency [E53.8] 08/12/2018  . Constipation [K59.00]   . Acute on chronic cholecystitis s/p lap cholecystectomy 12/06/2017 [K81.2] 12/04/2017  . Nephrolithiasis [N20.0] 12/04/2017  . Abdominal pain [R10.9] 12/04/2017  . Spinal stenosis in cervical region [M48.02] 04/24/2017  . Spinal stenosis of lumbar region [M48.061] 04/24/2017  . Dizziness [R42] 04/24/2017  .  Ureteral stone with hydronephrosis [N13.2] 01/20/2017  . Atrial fibrillation with RVR ([place]) [I48.91] 02/24/2016  . CKD (chronic kidney disease) stage 3, GFR 30-59 ml/min (HCC) [N18.3] 02/24/2016  . Paroxysmal atrial fibrillation ([place]) [I48.0] 02/24/2016  . Exudative macular degeneration ([place]) [V40.0867] 02/24/2016  . CHOLELITHIASIS [K80.20] 02/14/2009  . HYPERLIPIDEMIA [E78.5] 12/29/2008  . Essential hypertension [I10] 12/29/2008  . MYOCARDIAL INFARCTION, HX OF [I25.2] 12/29/2008  .  CAD (coronary artery disease) [I25.10] 12/29/2008    Past Medical History Past Medical History:  Diagnosis Date  . Aortic ectasia ([place])   . Arm fracture 2013   right, Dr. Sharen Heck  . B12 deficiency   . CAD S/P percutaneous coronary angioplasty    hx MI 04/ 1994  s/p  PCI to LAD  . CKD (chronic kidney disease), stage III ([place])   . Complication of anesthesia    hard to wake   . Gallstones   . History of basal cell carcinoma (BCC) excision    right eye area 08/ 2013  . History of kidney stones 01/20/2017  . History of MI (myocardial infarction) 09/1992   s/p  PCI to LAD  . History of squamous cell carcinoma excision    right lower leg 2015 ;  2016  . Hx of colonic polyps   . Hyperlipidemia   . Hypertension   . Macular degeneration   . Osteoporosis   . PAF (paroxysmal atrial fibrillation) ([place]) dx 02-24-2016   followed by cardiologist-- dr Mamie Nick. [name]  . Pernicious anemia   . PONV (postoperative nausea and vomiting)   . Rib fracture 07/2018   left  . Right ureteral stone   . Wears glasses     Past Surgical History Past Surgical History:  Procedure Laterality Date  . ABDOMINAL HYSTERECTOMY    . BREAST EXCISIONAL BIOPSY Left 11/02/2009   fibroadenoma (lumpectomy)  . BROW LIFT Right 04/07/2014   Procedure: REPAIR OF ENTROPION AND TRICHIASIS OF RIGHT LOWER EYE LID ;  Surgeon: Theodoro Kos, DO;  Location: [facility];  Service: Plastics;  Laterality: Right;  . CARDIOVERSION N/A 07/18/2017   Procedure: CARDIOVERSION;  Surgeon: Skeet Latch, MD;  Location: [facility];  Service: Cardiovascular;  Laterality: N/A;  . CATARACT EXTRACTION W/ INTRAOCULAR LENS  IMPLANT, BILATERAL    . CHOLECYSTECTOMY N/A 12/06/2017   Procedure: LAPAROSCOPIC CHOLECYSTECTOMY;  Surgeon: Jovita Kussmaul, MD;  Location: [facility];  Service: General;  Laterality: N/A;  . CORONARY ANGIOPLASTY  04/ 1994  dr Lia Foyer   PCI to LAD  . CORONARY ANGIOPLASTY    . CYSTOSCOPY W/ URETERAL STENT PLACEMENT  Right 01/20/2017   Procedure: CYSTOSCOPY WITH RIGHT RETROGRADE PYELOGRAM/RIGHT URETERAL STENT PLACEMENT;  Surgeon: Alexis Frock, MD;  Location: [facility];  Service: Urology;  Laterality: Right;  . CYSTOSCOPY WITH RETROGRADE PYELOGRAM, URETEROSCOPY AND STENT PLACEMENT Right 02/15/2017   Procedure: CYSTOSCOPY WITH RETROGRADE PYELOGRAM, URETEROSCOPY, STONE BASKETRY AND STENT REPLACEMENT;  Surgeon: Alexis Frock, MD;  Location: [facility];  Service: Urology;  Laterality: Right;  . CYSTOSCOPY/RETROGRADE/URETEROSCOPY/STONE EXTRACTION WITH BASKET  yrs ago  . DILATION AND CURETTAGE OF UTERUS    . HOLMIUM LASER APPLICATION Right 11/21/5091   Procedure: HOLMIUM LASER APPLICATION;  Surgeon: Alexis Frock, MD;  [facility];  Service: Urology;  Laterality: Right;  . KNEE SURGERY     right  . NEUROPLASTY / TRANSPOSITION ULNAR NERVE AT ELBOW Left 02-23-2000    [facility]   and Left Carpal Tunnel Release  . r eyelid cancer    .  TONSILLECTOMY    . TRANSTHORACIC ECHOCARDIOGRAM  02/26/2016   moderate focal basal and mild concentric LVH,  ef 55-60%,  akinesis of the basal-midanteroseptal, inferoseptal and apicalinferior myocardium, study not sufficient to evaluation diastolic funciton due to atrial fib./  trivial PR/  mild TR  . UMBILICAL HERNIA REPAIR      Family History Family History  Problem Relation Age of Onset  . Coronary artery disease Other        positive family hx of  . Cancer Other        family hx of  . Brain cancer Son   . Liver cancer Daughter   . Neuropathy Neg Hx      Social History  reports that she has quit smoking. Her smoking use included cigarettes. She quit after 5.00 years of use. She has never used smokeless tobacco. She reports that she does not drink alcohol or use drugs.  Medications  Current Outpatient Medications:  .  diltiazem (CARDIZEM CD) 120 MG 24 hr capsule, TK  1   C  PO  2 XD, Disp: , Rfl:  .  ELIQUIS 5 MG TABS tablet, TAKE 1  TABLET BY MOUTH TWICE DAILY, Disp: 60 tablet, Rfl: 6 .  metoprolol succinate (TOPROL-XL) 25 MG 24 hr tablet, Take 1 tablet (25 mg total) by mouth daily with supper., Disp: 30 tablet, Rfl: 3 .  Multiple Vitamins-Minerals (ICAPS) CAPS, Take by mouth., Disp: , Rfl:   Allergies Antihistamines, chlorpheniramine-type; Penicillins; Contrast media [iodinated diagnostic agents]; Sulfonamide derivatives; Atorvastatin; and Pheniramine  Review of Systems Review of Systems - Oncology ROS negative joint pain due to aging and need for knee replacement.     Physical Exam  Vitals Wt Readings from Last 3 Encounters:  10/15/18 162 lb 3.2 oz (73.6 kg)  08/12/18 162 lb (73.5 kg)  07/28/18 160 lb (72.6 kg)   Temp Readings from Last 3 Encounters:  10/15/18 (!) 97.5 F (36.4 C) (Oral)  07/28/18 (!) 97.5 F (36.4 C) (Oral)  05/09/18 97.8 F (36.6 C) (Oral)   BP Readings from Last 3 Encounters:  10/15/18 140/78  10/01/18 138/84  09/17/18 128/90   Pulse Readings from Last 3 Encounters:  10/15/18 72  10/01/18 (!) 110  09/17/18 81    Constitutional: Well-developed, well-nourished, and in no distress.   HENT: Head: Normocephalic and atraumatic.  Mouth/Throat: No oropharyngeal exudate. Mucosa moist. Eyes: Pupils are equal, round, and reactive to light. Conjunctivae are normal. No scleral icterus.  Neck: Normal range of motion. Neck supple. No JVD present.  Cardiovascular: Normal rate, regular rhythm and normal heart sounds.  Exam reveals no gallop and no friction rub.   No murmur heard. Pulmonary/Chest: Effort normal and breath sounds normal. No respiratory distress. No wheezes.No rales.  Abdominal: Soft. Bowel sounds are normal. No distension. There is no tenderness. There is no guarding.  Musculoskeletal: No edema or tenderness.  Lymphadenopathy: No cervical,axillary or supraclavicular adenopathy.  Neurological: Alert and oriented to person, place, and time. No cranial nerve deficit.  Skin:  Skin is warm and dry. No rash noted. No erythema. No pallor. Chronic solar changes and minor bruises.   Psychiatric: Affect and judgment normal.   Labs Appointment on 10/15/2018  Component Date Value Ref Range Status  . Sodium 10/15/2018 142  135 - 145 mmol/L Final  . Potassium 10/15/2018 4.1  3.5 - 5.1 mmol/L Final  . Chloride 10/15/2018 109  98 - 111 mmol/L Final  . CO2 10/15/2018 25  22 - 32 mmol/L  Final  . Glucose, Bld 10/15/2018 99  70 - 99 mg/dL Final  . BUN 10/15/2018 11  8 - 23 mg/dL Final  . Creatinine 10/15/2018 1.09* 0.44 - 1.00 mg/dL Final  . Calcium 10/15/2018 9.7  8.9 - 10.3 mg/dL Final  . Total Protein 10/15/2018 6.8  6.5 - 8.1 g/dL Final  . Albumin 10/15/2018 3.7  3.5 - 5.0 g/dL Final  . AST 10/15/2018 19  15 - 41 U/L Final  . ALT 10/15/2018 12  0 - 44 U/L Final  . Alkaline Phosphatase 10/15/2018 120  38 - 126 U/L Final  . Total Bilirubin 10/15/2018 0.7  0.3 - 1.2 mg/dL Final  . GFR, Est Non Af Am 10/15/2018 43* >60 mL/min Final  . GFR, Est AFR Am 10/15/2018 50* >60 mL/min Final  . Anion gap 10/15/2018 8  5 - 15 Final   Performed at [facility], [place] [address], [place], [place] [address]  . WBC Count 10/15/2018 6.1  4.0 - 10.5 K/uL Final  . RBC 10/15/2018 4.45  3.87 - 5.11 MIL/uL Final  . Hemoglobin 10/15/2018 12.5  12.0 - 15.0 g/dL Final  . HCT 10/15/2018 41.0  36.0 - 46.0 % Final  . MCV 10/15/2018 92.1  80.0 - 100.0 fL Final  . MCH 10/15/2018 28.1  26.0 - 34.0 pg Final  . MCHC 10/15/2018 30.5  30.0 - 36.0 g/dL Final  . RDW 10/15/2018 15.3  11.5 - 15.5 % Final  . Platelet Count 10/15/2018 210  150 - 400 K/uL Final  . nRBC 10/15/2018 0.0  0.0 - 0.2 % Final  . Neutrophils Relative % 10/15/2018 65  % Final  . Neutro Abs 10/15/2018 4.0  1.7 - 7.7 K/uL Final  . Lymphocytes Relative 10/15/2018 24  % Final  . Lymphs Abs 10/15/2018 1.5  0.7 - 4.0 K/uL Final  . Monocytes Relative 10/15/2018 9  % Final  . Monocytes Absolute 10/15/2018 0.5  0.1 -  1.0 K/uL Final  . Eosinophils Relative 10/15/2018 2  % Final  . Eosinophils Absolute 10/15/2018 0.1  0.0 - 0.5 K/uL Final  . Basophils Relative 10/15/2018 0  % Final  . Basophils Absolute 10/15/2018 0.0  0.0 - 0.1 K/uL Final  . Immature Granulocytes 10/15/2018 0  % Final  . Abs Immature Granulocytes 10/15/2018 0.01  0.00 - 0.07 K/uL Final   Performed at [facility], [place] [address], [place], [place] [address]     Pathology Orders Placed This Encounter  Procedures  . CBC with Differential ([facility] Only)    Standing Status:   Future    Number of Occurrences:   1    Standing Expiration Date:   10/15/2019  . CMP ([place] only)    Standing Status:   Future    Number of Occurrences:   1    Standing Expiration Date:   10/15/2019  . Lactate dehydrogenase (LDH)    Standing Status:   Future    Number of Occurrences:   1    Standing Expiration Date:   10/15/2019  . SPEP with reflex to IFE    Standing Status:   Future    Number of Occurrences:   1    Standing Expiration Date:   10/15/2019  . Kappa/lambda light chains    Standing Status:   Future    Number of Occurrences:   1    Standing Expiration Date:   10/15/2019  . QIG  (Quant. immunoglobulins  -  IgG, IgA, IgM)    Standing Status:   Future    Number of Occurrences:   1    Standing Expiration Date:   10/15/2019       Zoila Shutter MD

## 2018-10-16 ENCOUNTER — Telehealth: Payer: Self-pay | Admitting: Internal Medicine

## 2018-10-16 ENCOUNTER — Encounter (HOSPITAL_COMMUNITY): Payer: Self-pay | Admitting: Nurse Practitioner

## 2018-10-16 ENCOUNTER — Ambulatory Visit (HOSPITAL_COMMUNITY)
Admission: RE | Admit: 2018-10-16 | Discharge: 2018-10-16 | Disposition: A | Discharge status: Home / Self Care | Payer: Medicare Other | Source: Ambulatory Visit | Attending: Nurse Practitioner | Admitting: Nurse Practitioner

## 2018-10-16 VITALS — HR 90 | Ht 65.5 in

## 2018-10-16 DIAGNOSIS — R21 Rash and other nonspecific skin eruption: Secondary | ICD-10-CM | POA: Diagnosis not present

## 2018-10-16 LAB — PROTEIN ELECTROPHORESIS, SERUM, WITH REFLEX
A/G Ratio: 1.3 (ref 0.7–1.7)
Albumin ELP: 3.6 g/dL (ref 2.9–4.4)
Alpha-1-Globulin: 0.2 g/dL (ref 0.0–0.4)
Alpha-2-Globulin: 0.9 g/dL (ref 0.4–1.0)
Beta Globulin: 1 g/dL (ref 0.7–1.3)
Gamma Globulin: 0.8 g/dL (ref 0.4–1.8)
Globulin, Total: 2.8 g/dL (ref 2.2–3.9)
Total Protein ELP: 6.4 g/dL (ref 6.0–8.5)

## 2018-10-16 LAB — IGG, IGA, IGM
IgA: 286 mg/dL (ref 64–422)
IgG (Immunoglobin G), Serum: 787 mg/dL (ref 586–1602)
IgM (Immunoglobulin M), Srm: 28 mg/dL (ref 26–217)

## 2018-10-16 LAB — KAPPA/LAMBDA LIGHT CHAINS
Kappa free light chain: 20.7 mg/L — ABNORMAL HIGH (ref 3.3–19.4)
Kappa, lambda light chain ratio: 1.14 (ref 0.26–1.65)
Lambda free light chains: 18.2 mg/L (ref 5.7–26.3)

## 2018-10-16 NOTE — Telephone Encounter (Signed)
Tried to reach regarding schedule °

## 2018-10-16 NOTE — Progress Notes (Signed)
Electrophysiology TeleHealth Note   Due to national recommendations of social distancing due to [place] 19, Audio/video  telehealth visit is felt to be most appropriate for this patient at this time.  See MyChart message/consent below  from today for patient consent regarding telehealth for the [facility].    Date:  10/16/2018   ID:  Shelly Obrien Age, DOB April 27, 1923, MRN [id number]  Location: home  Provider location: [address] [place], [place] [address] Evaluation Performed: Follow up  PCP:  [name], MD  Primary Cardiologist: [name] Primary Electrophysiologist: afib clinic  CC: rash   History of Present Illness: Shelly Obrien is a 85 y.o. female who presents via audio/video conferencing for a telehealth visit today.     This is a f/u on visit from last week for rash. Pt has had for over a year. Has seen a dermatologist for it in the past and has gotten a "cream."  She felt it was a change in diltiazem to another generic in the last month.She talked to pharmacist and he did not feel this was an issue. She was able to get some more cream which  is helping. In the past she questioned DOAC and ws told to switch to pradaxa which she has not done for cost. She still feels it is the diltiazem. There is a note in a note in the past  that stated she did not tolerate BB's but she is willing to try again. It is not listed on her allergy list. She does need rate control med on board.  F/u per virtual  phone call visit  in afib clinic 5/14.  She has now changed over to metoprolol from cardizem and reports that she feels that she is itching less but still has some "bumps" that have not gone away.  For now she is happy and is tolerating the BB well. Her other only option is to try warfarin or pradaxa. For now she will hold the course.    Today, she denies symptoms of palpitations, chest pain, shortness of breath, orthopnea, PND, lower extremity edema, claudication, dizziness,  presyncope, syncope, bleeding, or neurologic sequela.+ rash, improving. The patient is tolerating medications without difficulties and is otherwise without complaint today.   she denies symptoms of cough, fevers, chills, or new SOB worrisome for COVID 19.   she has a BMI of Body mass index is 26.58 kg/m.[name]   Past Medical History:  Diagnosis Date  . Aortic ectasia ([place])   . Arm fracture 2013   right, Dr. Sharen Heck  . B12 deficiency   . CAD S/P percutaneous coronary angioplasty    hx MI 04/ 1994  s/p  PCI to LAD  . CKD (chronic kidney disease), stage III ([place])   . Complication of anesthesia    hard to wake   . Gallstones   . History of basal cell carcinoma (BCC) excision    right eye area 08/ 2013  . History of kidney stones 01/20/2017  . History of MI (myocardial infarction) 09/1992   s/p  PCI to LAD  . History of squamous cell carcinoma excision    right lower leg 2015 ;  2016  . Hx of colonic polyps   . Hyperlipidemia   . Hypertension   . Macular degeneration   . Osteoporosis   . PAF (paroxysmal atrial fibrillation) ([place]) dx 02-24-2016   followed by cardiologist-- dr Mamie Nick. [name]  . Pernicious anemia   . PONV (postoperative nausea and  vomiting)   . Rib fracture 07/2018   left  . Right ureteral stone   . Wears glasses    Past Surgical History:  Procedure Laterality Date  . ABDOMINAL HYSTERECTOMY    . BREAST EXCISIONAL BIOPSY Left 11/02/2009   fibroadenoma (lumpectomy)  . BROW LIFT Right 04/07/2014   Procedure: REPAIR OF ENTROPION AND TRICHIASIS OF RIGHT LOWER EYE LID ;  Surgeon: Theodoro Kos, DO;  Location: [facility];  Service: Plastics;  Laterality: Right;  . CARDIOVERSION N/A 07/18/2017   Procedure: CARDIOVERSION;  Surgeon: Skeet Latch, MD;  Location: [facility];  Service: Cardiovascular;  Laterality: N/A;  . CATARACT EXTRACTION W/ INTRAOCULAR LENS  IMPLANT, BILATERAL    . CHOLECYSTECTOMY N/A 12/06/2017   Procedure: LAPAROSCOPIC CHOLECYSTECTOMY;   Surgeon: Jovita Kussmaul, MD;  Location: [facility];  Service: General;  Laterality: N/A;  . CORONARY ANGIOPLASTY  04/ 1994  dr Lia Foyer   PCI to LAD  . CORONARY ANGIOPLASTY    . CYSTOSCOPY W/ URETERAL STENT PLACEMENT Right 01/20/2017   Procedure: CYSTOSCOPY WITH RIGHT RETROGRADE PYELOGRAM/RIGHT URETERAL STENT PLACEMENT;  Surgeon: Alexis Frock, MD;  [facility];  Service: Urology;  Laterality: Right;  . CYSTOSCOPY WITH RETROGRADE PYELOGRAM, URETEROSCOPY AND STENT PLACEMENT Right 02/15/2017   Procedure: CYSTOSCOPY WITH RETROGRADE PYELOGRAM, URETEROSCOPY, STONE BASKETRY AND STENT REPLACEMENT;  Surgeon: Alexis Frock, MD;  [facility]  Service: Urology;  Laterality: Right;  . CYSTOSCOPY/RETROGRADE/URETEROSCOPY/STONE EXTRACTION WITH BASKET  yrs ago  . DILATION AND CURETTAGE OF UTERUS    . HOLMIUM LASER APPLICATION Right 4/31/5400   Procedure: HOLMIUM LASER APPLICATION;  Surgeon: Alexis Frock, MD;  Location: [facility];  Service: Urology;  Laterality: Right;  . KNEE SURGERY     right  . NEUROPLASTY / TRANSPOSITION ULNAR NERVE AT ELBOW Left 02-23-2000    [facility]   and Left Carpal Tunnel Release  . r eyelid cancer    . TONSILLECTOMY    . TRANSTHORACIC ECHOCARDIOGRAM  02/26/2016   moderate focal basal and mild concentric LVH,  ef 55-60%,  akinesis of the basal-midanteroseptal, inferoseptal and apicalinferior myocardium, study not sufficient to evaluation diastolic funciton due to atrial fib./  trivial PR/  mild TR  . UMBILICAL HERNIA REPAIR       Current Outpatient Medications  Medication Sig Dispense Refill  . ELIQUIS 5 MG TABS tablet TAKE 1 TABLET BY MOUTH TWICE DAILY 60 tablet 6  . metoprolol succinate (TOPROL-XL) 25 MG 24 hr tablet Take 1 tablet (25 mg total) by mouth daily with supper. 30 tablet 3  . Multiple Vitamins-Minerals (ICAPS) CAPS Take by mouth.     No current facility-administered medications for this encounter.     Allergies:    Antihistamines, chlorpheniramine-type; Penicillins; Contrast media [iodinated diagnostic agents]; Sulfonamide derivatives; Atorvastatin; and Pheniramine   Social History:  The patient  reports that she has quit smoking. Her smoking use included cigarettes. She quit after 5.00 years of use. She has never used smokeless tobacco. She reports that she does not drink alcohol or use drugs.   Family History:  The patient's  family history includes Brain cancer in her son; Cancer in an other family member; Coronary artery disease in an other family member; Liver cancer in her daughter.    ROS:  Please see the history of present illness.   All other systems are personally reviewed and negative.   Exam: NA  Recent Labs: 08/12/2018: TSH 1.750 10/15/2018: ALT 12; BUN 11; Creatinine 1.09; Hemoglobin 12.5;  Platelet Count 210; Potassium 4.1; Sodium 142  personally reviewed       ASSESSMENT AND PLAN:  1. Rash Has stopped diltiazem and started metoprolol ER 25 mg at suppertime She feels this has helped her rash, not itching as much but has not completely resolved. For now she sill continue to monitor and if rash does not completely resolved then can discuss again switching to pradaxa or coumadin   COVID screen The patient does not have any symptoms that suggest any further testing/ screening at this time.  Social distancing reinforced today.    Follow-up:   As needed  Current medicines are reviewed at length with the patient today.   No changes made  Labs/ tests ordered today include:none No orders of the defined types were placed in this encounter.   Patient Risk:  after full review of this patients clinical status, I feel that they are at high risk at this time.   Today, I have spent 10 minutes with the patient with telehealth technology discussing  .    Eduard Roux NP 10/16/2018 11:33 AM  Afib [facility] [address] [place], [place] [address]  [phone]   I hereby voluntarily request, consent and authorize the [facility] and its employed or contracted physicians, physician assistants, nurse practitioners or other licensed health care professionals (the Practitioner), to provide me with telemedicine health care services (the "Services") as deemed necessary by the treating Practitioner. I acknowledge and consent to receive the Services by the Practitioner via telemedicine. I understand that the telemedicine visit will involve communicating with the Practitioner through live audiovisual communication technology and the disclosure of certain medical information by electronic transmission. I acknowledge that I have been given the opportunity to request an in-person assessment or other available alternative prior to the telemedicine visit and am voluntarily participating in the telemedicine visit.   I understand that I have the right to withhold or withdraw my consent to the use of telemedicine in the course of my care at any time, without affecting my right to future care or treatment, and that the Practitioner or I may terminate the telemedicine visit at any time. I understand that I have the right to inspect all information obtained and/or recorded in the course of the telemedicine visit and may receive copies of available information for a reasonable fee.  I understand that some of the potential risks of receiving the Services via telemedicine include:   Delay or interruption in medical evaluation due to technological equipment failure or disruption;  Information transmitted may not be sufficient (e.g. poor resolution of images) to allow for appropriate medical decision making by the Practitioner; and/or  In rare instances, security protocols could fail, causing a breach of personal health information.   Furthermore, I acknowledge that it is my responsibility to provide information about my medical history, conditions and care  that is complete and accurate to the best of my ability. I acknowledge that Practitioner's advice, recommendations, and/or decision may be based on factors not within their control, such as incomplete or inaccurate data provided by me or distortions of diagnostic images or specimens that may result from electronic transmissions. I understand that the practice of medicine is not an exact science and that Practitioner makes no warranties or guarantees regarding treatment outcomes. I acknowledge that I will receive a copy of this consent concurrently upon execution via email to the email address I last provided but may also request a printed copy  by calling the office of the [facility].  I understand that my insurance will be billed for this visit.   I have read or had this consent read to me.  I understand the contents of this consent, which adequately explains the benefits and risks of the Services being provided via telemedicine.  I have been provided ample opportunity to ask questions regarding this consent and the Services and have had my questions answered to my satisfaction.  I give my informed consent for the services to be provided through the use of telemedicine in my medical care  By participating in this telemedicine visit I agree to the above.

## 2018-10-20 ENCOUNTER — Ambulatory Visit: Payer: Medicare Other | Admitting: Cardiovascular Disease

## 2018-10-21 DIAGNOSIS — R21 Rash and other nonspecific skin eruption: Secondary | ICD-10-CM | POA: Diagnosis not present

## 2018-10-30 ENCOUNTER — Inpatient Hospital Stay (HOSPITAL_BASED_OUTPATIENT_CLINIC_OR_DEPARTMENT_OTHER): Payer: Medicare Other | Admitting: Internal Medicine

## 2018-10-30 DIAGNOSIS — I10 Essential (primary) hypertension: Secondary | ICD-10-CM

## 2018-10-30 DIAGNOSIS — E538 Deficiency of other specified B group vitamins: Secondary | ICD-10-CM

## 2018-10-30 DIAGNOSIS — I4891 Unspecified atrial fibrillation: Secondary | ICD-10-CM

## 2018-10-30 DIAGNOSIS — D472 Monoclonal gammopathy: Secondary | ICD-10-CM

## 2018-10-30 NOTE — Progress Notes (Signed)
Virtual Visit via Telephone Note  I connected with Shelly Obrien and her son on 10/30/18 at  3:00 PM EDT by telephone and verified that I am speaking with the correct person using two identifiers.   I discussed the limitations, risks, security and privacy concerns of performing an evaluation and management service by telephone and the availability of in person appointments. I also discussed with the patient that there may be a patient responsible charge related to this service. The patient expressed understanding and agreed to proceed.  Interval History:  Historical data obtained from note dated 10/15/2018.  85 year old female referred for evaluation due to monoclonal gammopathy.  Pt denies any symptoms other than occasional joint aches that she reports are due to aging and need for knee replacement.   She had labs done 08/12/2018 that showed SPEP with m spike measuring 0.5 g/dl IG A monoclonal protein with lambda light chain.  She had normal Quant IG.  MMA elevated at 1388.  Chemistries done 05/07/2018 showed K+ 4.8 Cr 1.16 Calcium 10 and normal LFTs.  Pt had rib xray 07/28/2018 after fall that showed left tenth rib fracture.    Observations/Objective: Review of labs from 10/15/2018   Assessment and Plan:1.  Monoclonal gammopathy of undetermined significance (MGUS).  85 year old female referred for evaluation due to monoclonal gammopathy.  Pt denies any symptoms other than occasional joint aches that she reports are due to aging and need for knee replacement.  She had labs done 08/12/2018 that showed SPEP with m spike measuring 0.5 g/dl IG A monoclonal protein with lambda light chain.  She had normal Quant IG.  MMA elevated at 1388.  Chemistries done 05/07/2018 showed K+ 4.8 Cr 1.16 Calcium 10 and normal LFTs.  Pt had rib xray 07/28/2018 after fall that showed left tenth rib fracture.    Labs done 10/15/2018 reviewed and showed WBC 6.1 HB 12.5 plts 210,000.  Chemistries WNL with K+ 4.1 Cr 1 and normal LFTs and  calcium level.  SPEP shows no monoclonal protein.  She has normal Quant IG and FLC ratio.    At initial visit I discussed with them that previous labs showed minimal M spike of 0.5 g/dl with no RI, anemia or elevated calcium.  I have discussed with them findings are consistent with Monoclonal  Gammopathy of undetermined significance ( MGUS).  SPEP done 10/15/2018 showed no monoclonal protein.  Pt will RTC in 03/2019 with repeat labs for follow-up.    2.  B12 deficiency.  MMA elevated at 1388 which is consistent with diagnosis.  Pt recommended for B12 supplementation.  Son will get OTC B12.  Will repeat labs on RTC in 03/2019.  3.  Fall.  Pt had rib fracture noted on xray done 07/2018.  Follow-up with PCP if recurrent symptoms.    4.  Hypertension.  BP was 140/78.  Follow-up with PCP for monitoring.    5.  Atrial fibrillation.  Pt currently on Eliquis.  Follow-up with PCP or cardiology as directed.    6.  Skin concerns. Pt is followed by dermatology.  Biopsy in 2018 showed KERATOACANTHOMA.  Pt has history of SCC of skin.  She should follow-up with dermatology as directed.    Follow Up Instructions: Follow-up 03/2019 with labs.    I discussed the assessment and treatment plan with the patient. The patient was provided an opportunity to ask questions and all were answered. The patient agreed with the plan and demonstrated an understanding of the  instructions.   The patient was advised to call back or seek an in-person evaluation if the symptoms worsen or if the condition fails to improve as anticipated.  I provided 15 minutes of non-face-to-face time during this encounter.   Zoila Shutter, MD

## 2018-11-04 DIAGNOSIS — X58XXXA Exposure to other specified factors, initial encounter: Secondary | ICD-10-CM | POA: Diagnosis not present

## 2018-11-04 DIAGNOSIS — B029 Zoster without complications: Secondary | ICD-10-CM | POA: Diagnosis not present

## 2018-11-06 DIAGNOSIS — H353231 Exudative age-related macular degeneration, bilateral, with active choroidal neovascularization: Secondary | ICD-10-CM | POA: Diagnosis not present

## 2018-12-04 DIAGNOSIS — H353231 Exudative age-related macular degeneration, bilateral, with active choroidal neovascularization: Secondary | ICD-10-CM | POA: Diagnosis not present

## 2018-12-09 DIAGNOSIS — L989 Disorder of the skin and subcutaneous tissue, unspecified: Secondary | ICD-10-CM | POA: Diagnosis not present

## 2018-12-09 DIAGNOSIS — D485 Neoplasm of uncertain behavior of skin: Secondary | ICD-10-CM | POA: Diagnosis not present

## 2018-12-09 DIAGNOSIS — B029 Zoster without complications: Secondary | ICD-10-CM | POA: Diagnosis not present

## 2018-12-31 ENCOUNTER — Other Ambulatory Visit (HOSPITAL_COMMUNITY): Payer: Self-pay | Admitting: Nurse Practitioner

## 2019-01-15 DIAGNOSIS — H353231 Exudative age-related macular degeneration, bilateral, with active choroidal neovascularization: Secondary | ICD-10-CM | POA: Diagnosis not present

## 2019-01-29 ENCOUNTER — Other Ambulatory Visit (HOSPITAL_COMMUNITY): Payer: Self-pay | Admitting: Nurse Practitioner

## 2019-02-04 ENCOUNTER — Ambulatory Visit (INDEPENDENT_AMBULATORY_CARE_PROVIDER_SITE_OTHER): Payer: Medicare Other | Admitting: Cardiovascular Disease

## 2019-02-04 ENCOUNTER — Encounter: Payer: Self-pay | Admitting: Cardiovascular Disease

## 2019-02-04 ENCOUNTER — Other Ambulatory Visit: Payer: Self-pay

## 2019-02-04 VITALS — BP 118/88 | HR 85 | Ht 65.5 in | Wt 157.8 lb

## 2019-02-04 DIAGNOSIS — I4891 Unspecified atrial fibrillation: Secondary | ICD-10-CM | POA: Diagnosis not present

## 2019-02-04 DIAGNOSIS — I2510 Atherosclerotic heart disease of native coronary artery without angina pectoris: Secondary | ICD-10-CM | POA: Diagnosis not present

## 2019-02-04 NOTE — Progress Notes (Signed)
Cardiology Office Note:    Date:  02/04/2019   ID:  RANEA RENEGAR, DOB 02-23-1923, MRN [id number]  PCP:  Shelly Orn, MD  Cardiologist:  Shelly Moores, MD  Electrophysiologist:  None   Referring MD: Shelly Orn, MD   Chief Complaint  Patient presents with  . Coronary Artery Disease  . Atrial Fibrillation     Nov. 18, 2019   Shelly Obrien is a 85 y.o. female with a hx of coronary artery disease and atrial fibrillation. She is seen for the first time today.  Transfer r from Dr. Harrington Obrien.  Had a cardioversion several months ago - seems to be better . Has been seen in the Afib clinic ,  Is on Eliquis 5  BID  Has had some issues with balance.   Lives at home by herself - townhouse  Hx of CAD in the past Had PTCA - years ago   February 04, 2019 Shelly Obrien is an elderly female with a history of coronary artery disease and atrial fibrillation.  She seems to be doing well. No cp or dyspnea.  Lives independantly .    Past Medical History:  Diagnosis Date  . Aortic ectasia (Shelly Obrien)   . Arm fracture 2013   right, Shelly Obrien  . B12 deficiency   . CAD S/P percutaneous coronary angioplasty    hx MI 04/ 1994  s/p  PCI to LAD  . CKD (chronic kidney disease), stage III (Shelly Obrien)   . Complication of anesthesia    hard to wake   . Gallstones   . History of basal cell carcinoma (BCC) excision    right eye area 08/ 2013  . History of kidney stones 01/20/2017  . History of MI (myocardial infarction) 09/1992   s/p  PCI to LAD  . History of squamous cell carcinoma excision    right lower leg 2015 ;  2016  . Hx of colonic polyps   . Hyperlipidemia   . Hypertension   . Macular degeneration   . Osteoporosis   . PAF (paroxysmal atrial fibrillation) (Shelly Obrien) dx 02-24-2016   followed by cardiologist-- dr Shelly Obrien. Shelly Obrien  . Pernicious anemia   . PONV (postoperative nausea and vomiting)   . Rib fracture 07/2018   left  . Right ureteral stone   . Wears glasses     Past Surgical History:   Procedure Laterality Date  . ABDOMINAL HYSTERECTOMY    . BREAST EXCISIONAL BIOPSY Left 11/02/2009   fibroadenoma (lumpectomy)  . BROW LIFT Right 04/07/2014   Procedure: REPAIR OF ENTROPION AND TRICHIASIS OF RIGHT LOWER EYE LID ;  Surgeon: Shelly Kos, DO;  Location: [facility];  Service: Plastics;  Laterality: Right;  . CARDIOVERSION N/A 07/18/2017   Procedure: CARDIOVERSION;  Surgeon: Shelly Latch, MD;  Location: [facility];  Service: Cardiovascular;  Laterality: N/A;  . CATARACT EXTRACTION W/ INTRAOCULAR LENS  IMPLANT, BILATERAL    . CHOLECYSTECTOMY N/A 12/06/2017   Procedure: LAPAROSCOPIC CHOLECYSTECTOMY;  Surgeon: Shelly Kussmaul, MD;  [facility];  Service: General;  Laterality: N/A;  . CORONARY ANGIOPLASTY  04/ 1994  dr Shelly Obrien   PCI to LAD  . CORONARY ANGIOPLASTY    . CYSTOSCOPY W/ URETERAL STENT PLACEMENT Right 01/20/2017   Procedure: CYSTOSCOPY WITH RIGHT RETROGRADE PYELOGRAM/RIGHT URETERAL STENT PLACEMENT;  Surgeon: Shelly Frock, MD;  [facility];  Service: Urology;  Laterality: Right;  . CYSTOSCOPY WITH RETROGRADE PYELOGRAM, URETEROSCOPY AND STENT PLACEMENT Right 02/15/2017   Procedure: CYSTOSCOPY WITH RETROGRADE  PYELOGRAM, URETEROSCOPY, STONE BASKETRY AND STENT REPLACEMENT;  Surgeon: Shelly Frock, MD;  Location[facility];  Service: Urology;  Laterality: Right;  . CYSTOSCOPY/RETROGRADE/URETEROSCOPY/STONE EXTRACTION WITH BASKET  yrs ago  . DILATION AND CURETTAGE OF UTERUS    . HOLMIUM LASER APPLICATION Right [id number]   Procedure: HOLMIUM LASER APPLICATION;  Surgeon: Shelly Frock, MD;  [facility];  Service: Urology;  Laterality: Right;  . KNEE SURGERY     right  . NEUROPLASTY / TRANSPOSITION ULNAR NERVE AT ELBOW Left 02-23-2000    [facility]   and Left Carpal Tunnel Release  . r eyelid cancer    . TONSILLECTOMY    . TRANSTHORACIC ECHOCARDIOGRAM  02/26/2016   moderate focal basal and mild concentric LVH,   ef 55-60%,  akinesis of the basal-midanteroseptal, inferoseptal and apicalinferior myocardium, study not sufficient to evaluation diastolic funciton due to atrial fib./  trivial PR/  mild TR  . UMBILICAL HERNIA REPAIR      Current Medications: Current Meds  Medication Sig  . ELIQUIS 5 MG TABS tablet TAKE 1 TABLET BY MOUTH TWICE DAILY  . ferrous sulfate 324 MG TBEC Take 324 mg by mouth daily with breakfast.  . metoprolol succinate (TOPROL-XL) 25 MG 24 hr tablet TAKE 1 TABLET BY MOUTH EVERY DAY WITH SUPPER  . Multiple Vitamins-Minerals (ICAPS) CAPS Take by mouth.     Allergies:   Antihistamines, chlorpheniramine-type; Penicillins; Contrast media [iodinated diagnostic agents]; Sulfonamide derivatives; Atorvastatin; and Pheniramine   Social History   Socioeconomic History  . Marital status: Widowed    Spouse name: Not on file  . Number of children: 2  . Years of education: Not on file  . Highest education level: Not on file  Occupational History  . Occupation: Retired  Scientific laboratory technician  . Financial resource strain: Not on file  . Food insecurity    Worry: Not on file    Inability: Not on file  . Transportation needs    Medical: Not on file    Non-medical: Not on file  Tobacco Use  . Smoking status: Former Smoker    Years: 5.00    Types: Cigarettes  . Smokeless tobacco: Never Used  . Tobacco comment: "didn't have the habit that much", just smoked "with the girls"  Substance and Sexual Activity  . Alcohol use: No    Alcohol/week: 0.0 standard drinks  . Drug use: No  . Sexual activity: Not on file  Lifestyle  . Physical activity    Days per week: Not on file    Minutes per session: Not on file  . Stress: Not on file  Relationships  . Social Herbalist on phone: Not on file    Gets together: Not on file    Attends religious service: Not on file    Active member of club or organization: Not on file    Attends meetings of clubs or organizations: Not on file     Relationship status: Not on file  Other Topics Concern  . Not on file  Social History Narrative   Lives at home alone   Right handed     Family History: The patient's family history includes Brain cancer in her son; Cancer in an other family member; Coronary artery disease in an other family member; Liver cancer in her daughter. There is no history of Neuropathy.  ROS:   Please see the history of present illness.    All other systems reviewed and are negative.  EKGs/Labs/Other Studies Reviewed:      EKG:    Sept. 2, 2020 :  NSR with blocked PACs   Recent Labs: 08/12/2018: TSH 1.750 10/15/2018: ALT 12; BUN 11; Creatinine 1.09; Hemoglobin 12.5; Platelet Count 210; Potassium 4.1; Sodium 142  Recent Lipid Panel    Component Value Date/Time   CHOL 166 05/07/2018 1041   TRIG 87 05/07/2018 1041   HDL 81 05/07/2018 1041   CHOLHDL 2.0 05/07/2018 1041   LDLCALC 68 05/07/2018 1041    Physical Exam:    Physical Exam: Blood pressure 118/88, pulse 85, height 5' 5.5" (1.664 m), weight 157 lb 12.8 oz (71.6 kg), SpO2 91 %.  GEN:  Elderly female  HEENT: Normal,  Right lower eyelid  is very red and  Droops downward  NECK: No JVD; No carotid bruits LYMPHATICS: No lymphadenopathy CARDIAC: RR with premature beats  RESPIRATORY:  Clear to auscultation without rales, wheezing or rhonchi  ABDOMEN: Soft, non-tender, non-distended MUSCULOSKELETAL:  No edema; No deformity  SKIN: Warm and dry NEUROLOGIC:  Alert and oriented x 3    ASSESSMENT:    1. Coronary artery disease involving native coronary artery of native heart without angina pectoris    PLAN:     1.  CAD - s/p PTCA many years ago.   No angina   2.  Atrial fib: .  Remains in NSR .  Has lots of PACs  Will see her in 1 year for office visit , CBC and BMP ( if not already done by her primary )    Medication Adjustments/Labs and Tests Ordered: Current medicines are reviewed at length with the patient today.  Concerns regarding  medicines are outlined above.  Orders Placed This Encounter  Procedures  . EKG 12-Lead   No orders of the defined types were placed in this encounter.   Patient Instructions  Medication Instructions:  No changes If you need a refill on your cardiac medications before your next appointment, please call your pharmacy.   Lab work: none If you have labs (blood work) drawn today and your tests are completely normal, you will receive your results only by: [name] MyChart Message (if you have MyChart) OR . A paper copy in the mail If you have any lab test that is abnormal or we need to change your treatment, we will call you to review the results.  Testing/Procedures: none  Follow-Up: At [facility], you and your health needs are our priority.  As part of our continuing mission to provide you with exceptional heart care, we have created designated Provider Care Teams.  These Care Teams include your primary Cardiologist (physician) and Advanced Practice Providers (APPs -  Physician Assistants and Nurse Practitioners) who all work together to provide you with the care you need, when you need it. You will need a follow up appointment in:  12 months.  Please call our office 2 months in advance to schedule this appointment.  You may see Dr. Mertie Obrien or one of the following Advanced Practice Providers on your designated Care Team: Richardson Dopp, PA-C [place], [place] . Daune Perch, NP  Any Other Special Instructions Will Be Listed Below (If Applicable).       Signed, Shelly Moores, MD  02/04/2019 5:17 PM    [place]

## 2019-02-04 NOTE — Patient Instructions (Signed)
Medication Instructions:  No changes If you need a refill on your cardiac medications before your next appointment, please call your pharmacy.   Lab work: none If you have labs (blood work) drawn today and your tests are completely normal, you will receive your results only by: [name] MyChart Message (if you have MyChart) OR . A paper copy in the mail If you have any lab test that is abnormal or we need to change your treatment, we will call you to review the results.  Testing/Procedures: none  Follow-Up: At [facility], you and your health needs are our priority.  As part of our continuing mission to provide you with exceptional heart care, we have created designated Provider Care Teams.  These Care Teams include your primary Cardiologist (physician) and Advanced Practice Providers (APPs -  Physician Assistants and Nurse Practitioners) who all work together to provide you with the care you need, when you need it. You will need a follow up appointment in:  12 months.  Please call our office 2 months in advance to schedule this appointment.  You may see Dr. Mertie Moores or one of the following Advanced Practice Providers on your designated Care Team: Richardson Dopp, PA-C [place], [place] . Daune Perch, NP  Any Other Special Instructions Will Be Listed Below (If Applicable).

## 2019-02-11 DIAGNOSIS — Z23 Encounter for immunization: Secondary | ICD-10-CM | POA: Diagnosis not present

## 2019-02-11 DIAGNOSIS — Z5181 Encounter for therapeutic drug level monitoring: Secondary | ICD-10-CM | POA: Diagnosis not present

## 2019-02-11 DIAGNOSIS — R21 Rash and other nonspecific skin eruption: Secondary | ICD-10-CM | POA: Diagnosis not present

## 2019-02-11 DIAGNOSIS — R7301 Impaired fasting glucose: Secondary | ICD-10-CM | POA: Diagnosis not present

## 2019-02-11 DIAGNOSIS — I480 Paroxysmal atrial fibrillation: Secondary | ICD-10-CM | POA: Diagnosis not present

## 2019-02-11 DIAGNOSIS — I2510 Atherosclerotic heart disease of native coronary artery without angina pectoris: Secondary | ICD-10-CM | POA: Diagnosis not present

## 2019-02-11 DIAGNOSIS — D510 Vitamin B12 deficiency anemia due to intrinsic factor deficiency: Secondary | ICD-10-CM | POA: Diagnosis not present

## 2019-02-11 DIAGNOSIS — I10 Essential (primary) hypertension: Secondary | ICD-10-CM | POA: Diagnosis not present

## 2019-02-12 DIAGNOSIS — H353231 Exudative age-related macular degeneration, bilateral, with active choroidal neovascularization: Secondary | ICD-10-CM | POA: Diagnosis not present

## 2019-03-02 ENCOUNTER — Telehealth: Payer: Self-pay | Admitting: Hematology and Oncology

## 2019-03-02 NOTE — Telephone Encounter (Signed)
Patient returned call and was given appointment for May 2021 lab 5/20 and f/u 5/27. Schedule mailed.

## 2019-03-02 NOTE — Telephone Encounter (Signed)
[facility] transfer to [place]. Left message for patient re appointment details and asked that patient call back to confirm. Schedule mailed. Per Alvy Bimler follow up in May with labs 1 week prior.

## 2019-03-05 IMAGING — CT CT RENAL STONE PROTOCOL
2 of 3 series · 15 of 46 positions shown, 17 images · non-contrast
Comparison: CT abdomen and pelvis January 24, 2016

CLINICAL DATA: RIGHT flank pain radiating to abdomen for 4 hours.
Nausea. Suspect recurrent stone disease.

EXAM:
CT ABDOMEN AND PELVIS WITHOUT CONTRAST
TECHNIQUE: Multidetector CT imaging of the abdomen and pelvis was performed
following the standard protocol without IV contrast.

[Series 4: lung · axial · 0.75mm/px · z∈[-148,-42]mm · 12 of 61 slices shown, 14 images]
[im 4/61  soft-tissue]
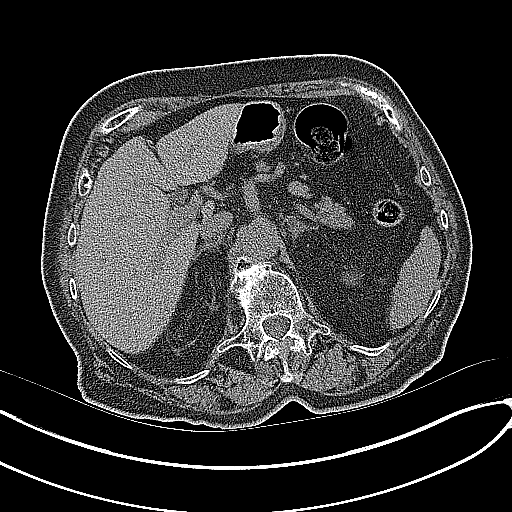
[im 4/61  bone]
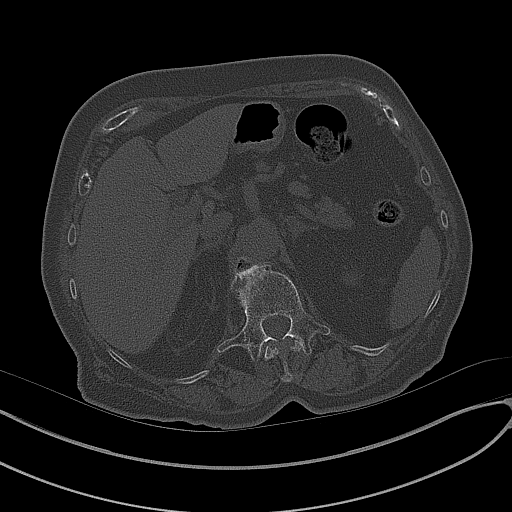
[im 8/61  soft-tissue]
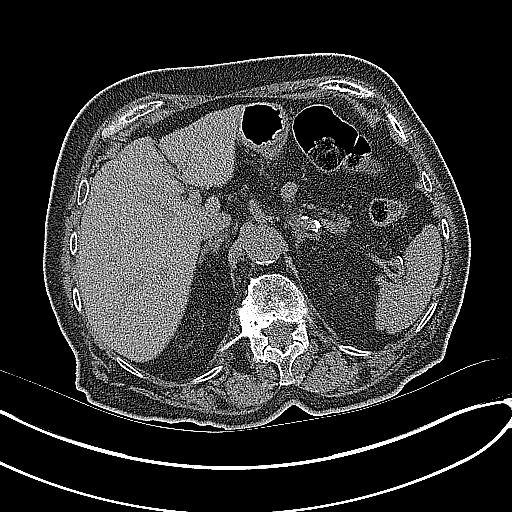
[im 14/61  soft-tissue]
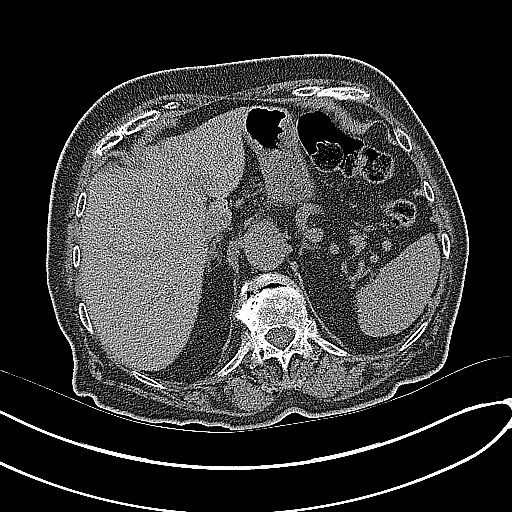
[im 18/61  soft-tissue]
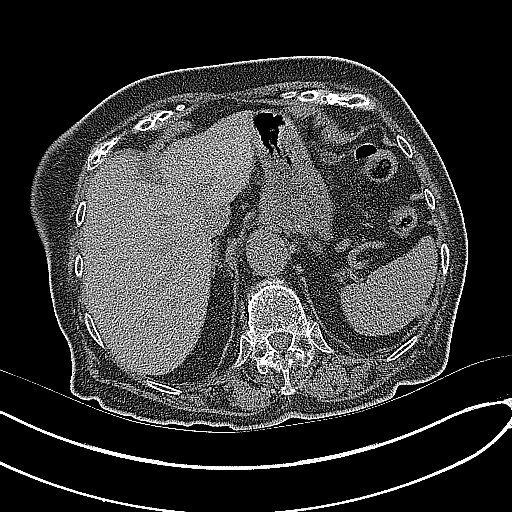
[im 24/61  soft-tissue]
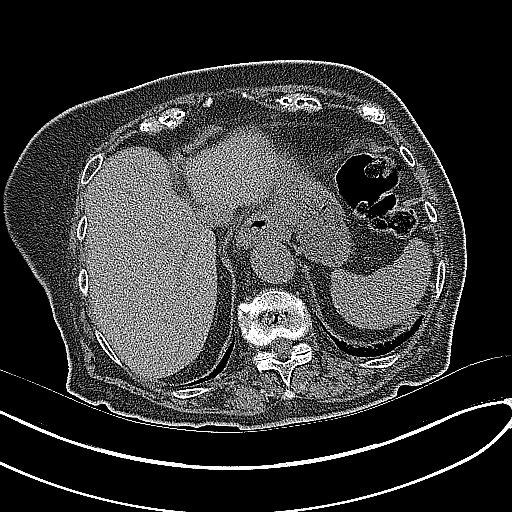
[im 28/61  soft-tissue]
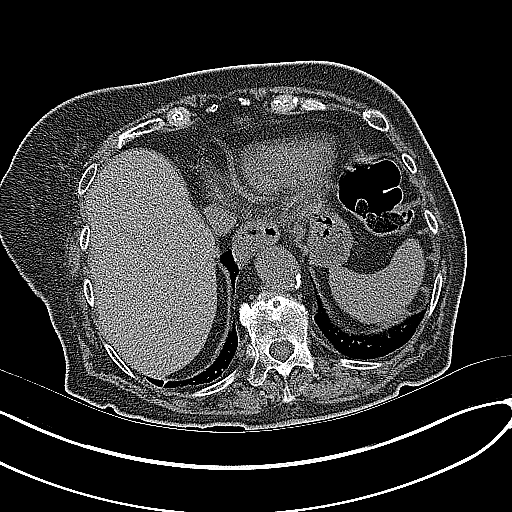
[im 33/61  soft-tissue]
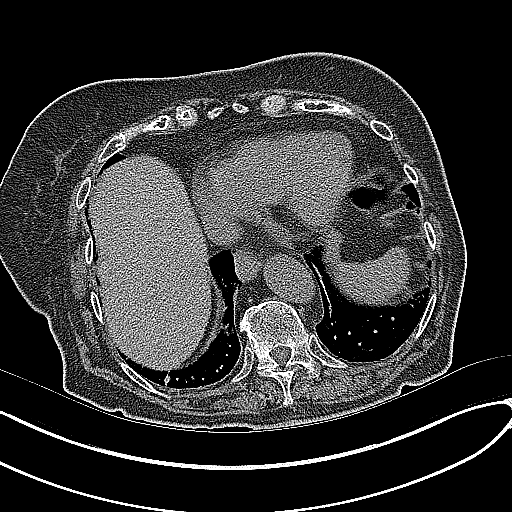
[im 37/61  soft-tissue]
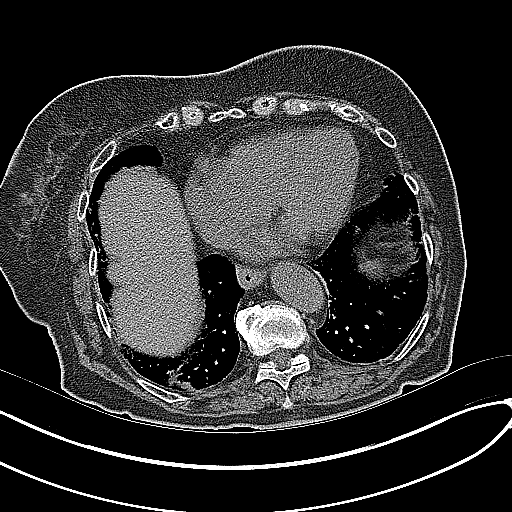
[im 43/61  soft-tissue]
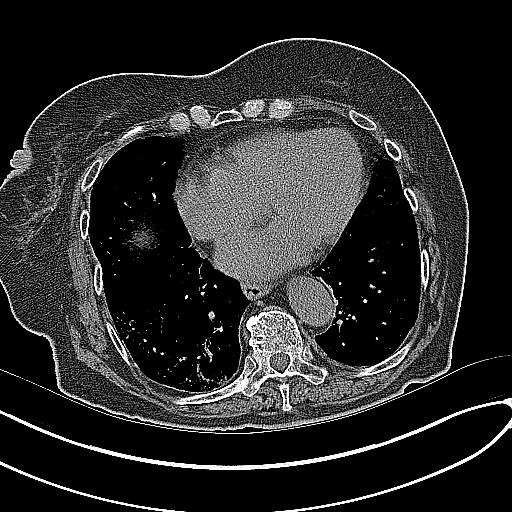
[im 43/61  bone]
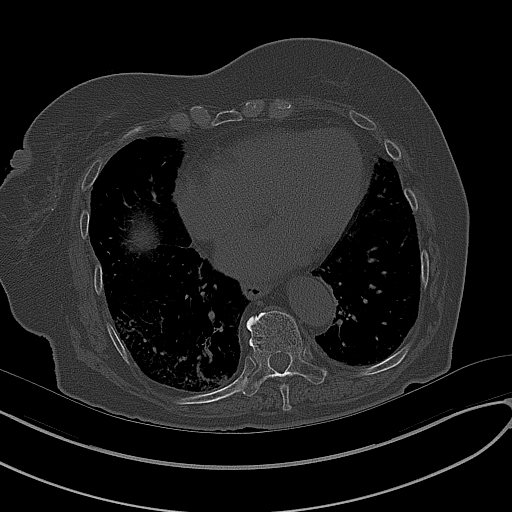
[im 47/61  soft-tissue]
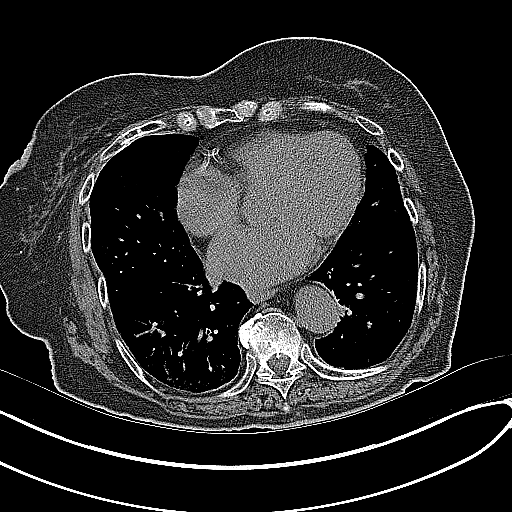
[im 53/61  soft-tissue]
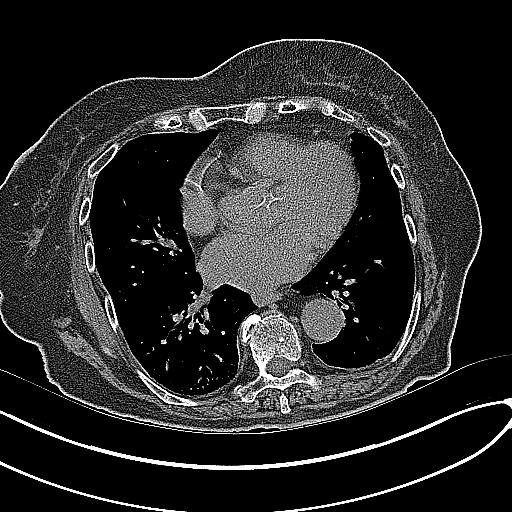
[im 57/61  soft-tissue]
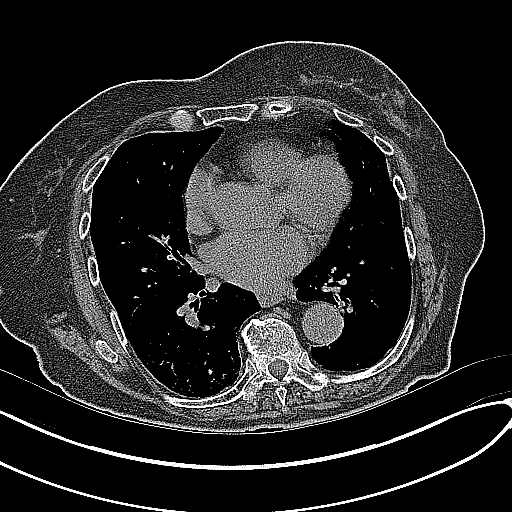

[Series 5: coronal · coronal · 0.72mm/px · 3 of 133 slices shown]
[im 45/133  soft-tissue]
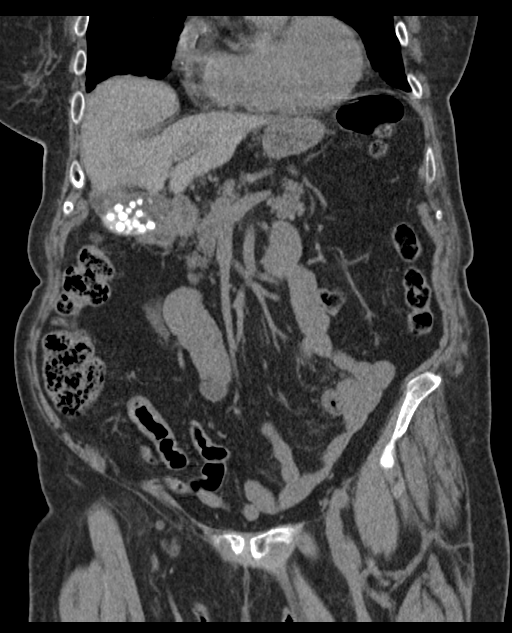
[im 59/133  soft-tissue]
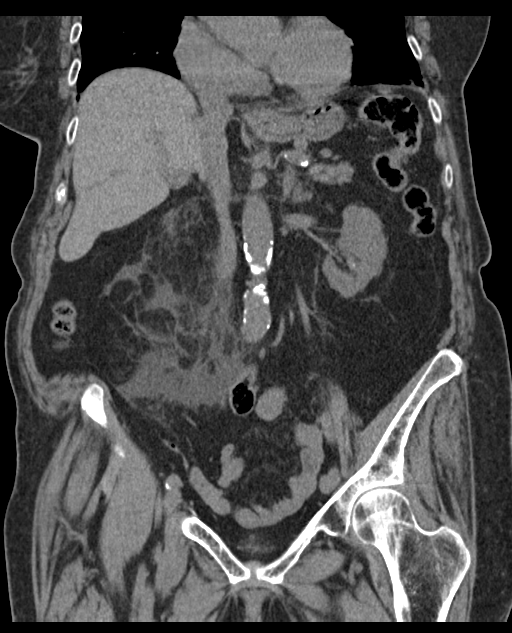
[im 74/133  soft-tissue]
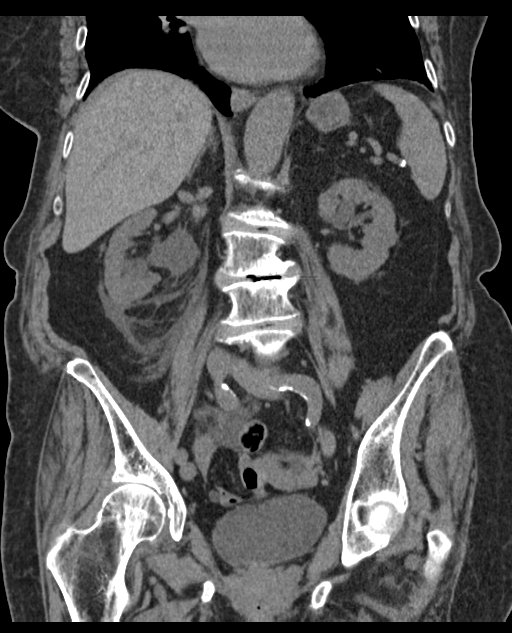

[15 of 46 positions shown; findings below may reference images not displayed]

FINDINGS: LOWER CHEST: Bibasilar atelectasis/scarring. The heart is mildly
enlarged. Mild coronary artery calcifications. No pericardial
effusion.

HEPATOBILIARY: Numerous subcentimeter gallstones without CT findings
of acute cholecystitis. Normal noncontrast CT liver.

PANCREAS: 16 mm cystic mass head of the pancreas. No ductal
dilatation.

SPLEEN: Normal.

ADRENALS/URINARY TRACT: Kidneys are orthotopic, demonstrating normal
size and morphology. Moderate RIGHT hydroureteronephrosis the level
of mid to distal ureter where a 4 mm calculus is present. Moderate
volume low-density fluid RIGHT retroperitoneum. Two RIGHT upper pole
nephrolithiasis measuring to 3 mm, additional punctate RIGHT greater
than LEFT nephrolithiasis. Mild LEFT pelviectasis. Limited
assessment for renal masses on this nonenhanced examination. Urinary
bladder is partially distended and unremarkable. Normal adrenal
glands.

STOMACH/BOWEL: Small hiatal hernia. The stomach, small and large
bowel are normal in course and caliber without inflammatory changes,
sensitivity decreased by lack of enteric contrast. Normal appendix.

VASCULAR/LYMPHATIC: Aortoiliac vessels are normal in course and
caliber. Moderate calcific atherosclerosis No lymphadenopathy by CT
size criteria.

REPRODUCTIVE: Status post hysterectomy.

OTHER: No intraperitoneal free fluid or free air. Calcified
granuloma in the pelvis.

MUSCULOSKELETAL: Non-acute. Stable coarse calcification LEFT breast.
Osteopenia. Moderate mid lumbar dextroscoliosis in severe
degenerative change of the lumbar spine. LEFT gluteal injection
granulomas.
IMPRESSION: 1. 4 mm mid to distal RIGHT ureteral calculus resulting in moderate
obstructive uropathy. Bilateral nephrolithiasis.
2. Moderate RIGHT retroperitoneal free fluid most compatible with
caliceal rupture.
3. **An incidental finding of potential clinical significance has
been found. 16 mm cystic mass in head of pancreas. Recommend 2 year
follow-up. This recommendation follows ACR consensus guidelines:
Management of Incidental Pancreatic Cysts: A White Paper of the ACR
Incidental Findings Committee. [HOSPITAL] 0724;[DATE]. **
Aortic Atherosclerosis (49XOU-UH4.4).

## 2019-03-19 DIAGNOSIS — H353231 Exudative age-related macular degeneration, bilateral, with active choroidal neovascularization: Secondary | ICD-10-CM | POA: Diagnosis not present

## 2019-04-02 DIAGNOSIS — H353231 Exudative age-related macular degeneration, bilateral, with active choroidal neovascularization: Secondary | ICD-10-CM | POA: Diagnosis not present

## 2019-04-23 DIAGNOSIS — H353231 Exudative age-related macular degeneration, bilateral, with active choroidal neovascularization: Secondary | ICD-10-CM | POA: Diagnosis not present

## 2019-05-29 ENCOUNTER — Other Ambulatory Visit (HOSPITAL_COMMUNITY): Payer: Self-pay | Admitting: Nurse Practitioner

## 2019-06-16 DIAGNOSIS — L259 Unspecified contact dermatitis, unspecified cause: Secondary | ICD-10-CM | POA: Diagnosis not present

## 2019-06-16 DIAGNOSIS — R21 Rash and other nonspecific skin eruption: Secondary | ICD-10-CM | POA: Diagnosis not present

## 2019-06-16 DIAGNOSIS — L298 Other pruritus: Secondary | ICD-10-CM | POA: Diagnosis not present

## 2019-07-07 ENCOUNTER — Telehealth (HOSPITAL_COMMUNITY): Payer: Self-pay | Admitting: *Deleted

## 2019-07-07 MED ORDER — METOPROLOL SUCCINATE ER 25 MG PO TB24: 25.0000 mg | ORAL_TABLET | Freq: Every day | ORAL | 6 refills | Status: DC

## 2019-07-07 NOTE — Telephone Encounter (Signed)
Patient having issues with going in and out of afib - HR was up around 120. She increased her metoprolol to 25mg  BID and this has helped her symptoms. Per Roderic Palau NP pt can continue current regimen if she is tolerating it ok. Pt states HR in the 70-80s. Will call back if afib returns.

## 2019-07-09 ENCOUNTER — Ambulatory Visit (HOSPITAL_COMMUNITY)
Admission: RE | Admit: 2019-07-09 | Discharge: 2019-07-09 | Disposition: A | Discharge status: Home / Self Care | Payer: Medicare Other | Source: Ambulatory Visit | Attending: Nurse Practitioner | Admitting: Nurse Practitioner

## 2019-07-09 ENCOUNTER — Encounter (HOSPITAL_COMMUNITY): Payer: Self-pay | Admitting: Nurse Practitioner

## 2019-07-09 ENCOUNTER — Other Ambulatory Visit: Payer: Self-pay

## 2019-07-09 VITALS — BP 148/92 | HR 107 | Ht 65.5 in | Wt 156.4 lb

## 2019-07-09 DIAGNOSIS — M810 Age-related osteoporosis without current pathological fracture: Secondary | ICD-10-CM | POA: Diagnosis not present

## 2019-07-09 DIAGNOSIS — Z87891 Personal history of nicotine dependence: Secondary | ICD-10-CM | POA: Insufficient documentation

## 2019-07-09 DIAGNOSIS — Z9841 Cataract extraction status, right eye: Secondary | ICD-10-CM | POA: Diagnosis not present

## 2019-07-09 DIAGNOSIS — I2510 Atherosclerotic heart disease of native coronary artery without angina pectoris: Secondary | ICD-10-CM | POA: Insufficient documentation

## 2019-07-09 DIAGNOSIS — N183 Chronic kidney disease, stage 3 unspecified: Secondary | ICD-10-CM | POA: Diagnosis not present

## 2019-07-09 DIAGNOSIS — I480 Paroxysmal atrial fibrillation: Secondary | ICD-10-CM | POA: Insufficient documentation

## 2019-07-09 DIAGNOSIS — Z7901 Long term (current) use of anticoagulants: Secondary | ICD-10-CM | POA: Diagnosis not present

## 2019-07-09 DIAGNOSIS — Z961 Presence of intraocular lens: Secondary | ICD-10-CM | POA: Diagnosis not present

## 2019-07-09 DIAGNOSIS — I499 Cardiac arrhythmia, unspecified: Secondary | ICD-10-CM | POA: Insufficient documentation

## 2019-07-09 DIAGNOSIS — D6869 Other thrombophilia: Secondary | ICD-10-CM

## 2019-07-09 DIAGNOSIS — I4892 Unspecified atrial flutter: Secondary | ICD-10-CM | POA: Insufficient documentation

## 2019-07-09 DIAGNOSIS — H353231 Exudative age-related macular degeneration, bilateral, with active choroidal neovascularization: Secondary | ICD-10-CM | POA: Diagnosis not present

## 2019-07-09 DIAGNOSIS — E785 Hyperlipidemia, unspecified: Secondary | ICD-10-CM | POA: Diagnosis not present

## 2019-07-09 DIAGNOSIS — I252 Old myocardial infarction: Secondary | ICD-10-CM | POA: Diagnosis not present

## 2019-07-09 DIAGNOSIS — Z79899 Other long term (current) drug therapy: Secondary | ICD-10-CM | POA: Insufficient documentation

## 2019-07-09 DIAGNOSIS — I129 Hypertensive chronic kidney disease with stage 1 through stage 4 chronic kidney disease, or unspecified chronic kidney disease: Secondary | ICD-10-CM | POA: Insufficient documentation

## 2019-07-09 DIAGNOSIS — Z9842 Cataract extraction status, left eye: Secondary | ICD-10-CM | POA: Diagnosis not present

## 2019-07-09 MED ORDER — METOPROLOL SUCCINATE ER 25 MG PO TB24: ORAL_TABLET | ORAL | 6 refills | Status: DC

## 2019-07-09 NOTE — Progress Notes (Signed)
Date:  07/09/2019   ID:  Shelly Obrien, DOB [birth year], MRN [id number]  Location:office  Provider location: [address] [place], [place] [address] Evaluation Performed: Follow up  PCP:  [name], MD  Primary Cardiologist: [name] Primary Electrophysiologist: afib clinic  CC: Irregular heart beat    History of Present Illness: Shelly Obrien is a 85 y.o. female with h/o CAD, s/p MI,  of afib  who presents in office   for irregular heart beat for about one week.  She  called in earlier in week, had increased her metoprolol to bid and felt better. New RX was sent for metoprolol ER bid . In office today, ekg shows atrial flutter with v rate of 107 bpm. She denies any change in her general health. Continues on eliquis 5 mg bid for a CHA2DS2VASc score of at least 4. She  changed from Cardizem to metoprolol last spring for a rash that improved off drug.   Today, she denies symptoms of palpitations, chest pain, shortness of breath, orthopnea, PND, lower extremity edema, claudication, dizziness, presyncope, syncope, bleeding, or neurologic sequela.+ rash, improving. The patient is tolerating medications without difficulties and is otherwise without complaint today.   she denies symptoms of cough, fevers, chills, or new SOB worrisome for COVID 19.   she has a BMI of Body mass index is 25.63 kg/m.[name]   Past Medical History:  Diagnosis Date  . Aortic ectasia ([place])   . Arm fracture 2013   right, Dr. Sharen Heck  . B12 deficiency   . CAD S/P percutaneous coronary angioplasty    hx MI 04/ 1994  s/p  PCI to LAD  . CKD (chronic kidney disease), stage III ([place])   . Complication of anesthesia    hard to wake   . Gallstones   . History of basal cell carcinoma (BCC) excision    right eye area 08/ 2013  . History of kidney stones 01/20/2017  . History of MI (myocardial infarction) 09/1992   s/p  PCI to LAD  . History of squamous cell carcinoma excision    right lower leg 2015 ;  2016  . Hx of  colonic polyps   . Hyperlipidemia   . Hypertension   . Macular degeneration   . Osteoporosis   . PAF (paroxysmal atrial fibrillation) ([place]) dx 02-24-2016   followed by cardiologist-- dr Mamie Nick. [name]  . Pernicious anemia   . PONV (postoperative nausea and vomiting)   . Rib fracture 07/2018   left  . Right ureteral stone   . Wears glasses    Past Surgical History:  Procedure Laterality Date  . ABDOMINAL HYSTERECTOMY    . BREAST EXCISIONAL BIOPSY Left 11/02/2009   fibroadenoma (lumpectomy)  . BROW LIFT Right 04/07/2014   Procedure: REPAIR OF ENTROPION AND TRICHIASIS OF RIGHT LOWER EYE LID ;  Surgeon: Theodoro Kos, DO;  Location: [facility];  Service: Plastics;  Laterality: Right;  . CARDIOVERSION N/A 07/18/2017   Procedure: CARDIOVERSION;  Surgeon: Skeet Latch, MD;  Location: [facility];  Service: Cardiovascular;  Laterality: N/A;  . CATARACT EXTRACTION W/ INTRAOCULAR LENS  IMPLANT, BILATERAL    . CHOLECYSTECTOMY N/A 12/06/2017   Procedure: LAPAROSCOPIC CHOLECYSTECTOMY;  Surgeon: Jovita Kussmaul, MD;  Location: [facility];  Service: General;  Laterality: N/A;  . CORONARY ANGIOPLASTY  04/ 1994  dr Lia Foyer   PCI to LAD  . CORONARY ANGIOPLASTY    . CYSTOSCOPY W/ URETERAL STENT PLACEMENT Right 01/20/2017  Procedure: CYSTOSCOPY WITH RIGHT RETROGRADE PYELOGRAM/RIGHT URETERAL STENT PLACEMENT;  Surgeon: Alexis Frock, MD;  Location: [facility];  Service: Urology;  Laterality: Right;  . CYSTOSCOPY WITH RETROGRADE PYELOGRAM, URETEROSCOPY AND STENT PLACEMENT Right 02/15/2017   Procedure: CYSTOSCOPY WITH RETROGRADE PYELOGRAM, URETEROSCOPY, STONE BASKETRY AND STENT REPLACEMENT;  Surgeon: Alexis Frock, MD;  Location: [facility];  Service: Urology;  Laterality: Right;  . CYSTOSCOPY/RETROGRADE/URETEROSCOPY/STONE EXTRACTION WITH BASKET  yrs ago  . DILATION AND CURETTAGE OF UTERUS    . HOLMIUM LASER APPLICATION Right [id number]   Procedure: HOLMIUM LASER APPLICATION;  Surgeon:  Alexis Frock, MD;  Location: [facility];  Service: Urology;  Laterality: Right;  . KNEE SURGERY     right  . NEUROPLASTY / TRANSPOSITION ULNAR NERVE AT ELBOW Left 02-23-2000    [facility]   and Left Carpal Tunnel Release  . r eyelid cancer    . TONSILLECTOMY    . TRANSTHORACIC ECHOCARDIOGRAM  02/26/2016   moderate focal basal and mild concentric LVH,  ef 55-60%,  akinesis of the basal-midanteroseptal, inferoseptal and apicalinferior myocardium, study not sufficient to evaluation diastolic funciton due to atrial fib./  trivial PR/  mild TR  . UMBILICAL HERNIA REPAIR       Current Outpatient Medications  Medication Sig Dispense Refill  . ELIQUIS 5 MG TABS tablet TAKE 1 TABLET BY MOUTH TWICE DAILY 60 tablet 6  . fluocinonide (LIDEX) 0.05 % external solution APPLY SMALL AMOUNT EXTERNALLY TO THE SCALP DAILY AS NEEDED FOR ITCHING    . metoprolol succinate (TOPROL-XL) 25 MG 24 hr tablet Take 37.5mg  (1 and 1/2 tablets) by mouth twice a day 60 tablet 6  . Multiple Vitamins-Minerals (ICAPS) CAPS Take by mouth 2 (two) times daily.     [name] triamcinolone (KENALOG) [id number] % cream 1 application apply on the skin twice a day  1 application apply on the skin twice a day [place] up to 2 weeks on, 1 week off suring flares only    . triamcinolone cream (KENALOG) 0.1 % APPLY A SMALL AMOUNT TOPICALLY TO THE AFFECTED AREA TWICE DAILY    . White Petrolatum-Mineral Oil ([place] PETROL-MINERAL OIL-LANOLIN) 0.1-0.1 % OINT      No current facility-administered medications for this encounter.    Allergies:   Antihistamines, chlorpheniramine-type; Penicillins; Contrast media [iodinated diagnostic agents]; Sulfonamide derivatives; Atorvastatin; and Pheniramine   Social History:  The patient  reports that she has quit smoking. Her smoking use included cigarettes. She quit after 5.00 years of use. She has never used smokeless tobacco. She reports that she does not drink alcohol or use drugs.   Family History:  The  patient's  family history includes Brain cancer in her son; Cancer in an other family member; Coronary artery disease in an other family member; Liver cancer in her daughter.    ROS:  Please see the history of present illness.   All other systems are personally reviewed and negative.   Exam: NA  Recent Labs: 08/12/2018: TSH 1.750 10/15/2018: ALT 12; BUN 11; Creatinine 1.09; Hemoglobin 12.5; Platelet Count 210; Potassium 4.1; Sodium 142  personally reviewed       ASSESSMENT AND PLAN:  1. PAF Feels to have been out of rhythm x one week Will increase BB to 1 1/2 tab bid  Had stopped diltiazem and started metoprolol ER  In the spring for a rash  Will increase metoprolol ER 25 mg to 1 1/2 tab bid for better rate control and possible conversion if lucky to SR  2. CHA2DS2VASc score of 4 Continue  eliquis 5 mg bid, states no missed doses  Current medicines are reviewed at length with the patient today.   No changes made  Labs/ tests ordered today include:none Orders Placed This Encounter  Procedures  . EKG 12-Lead    Patient Risk:  after full review of this patients clinical status, I feel that they are at high risk at this time.   I will bring back in one week   .    SignedRoderic Palau NP 07/09/2019 11:12 AM  [facility] [address] [place], [place] [address] [phone]

## 2019-07-09 NOTE — Patient Instructions (Signed)
Increase metoprolol to 1 and 1/2 tablets twice a day (37.5mg twice a day) °

## 2019-07-12 ENCOUNTER — Other Ambulatory Visit: Payer: Self-pay

## 2019-07-12 ENCOUNTER — Observation Stay (HOSPITAL_COMMUNITY)
Admission: EM | Admit: 2019-07-12 | Discharge: 2019-07-14 | Disposition: A | Discharge status: Home / Self Care | Payer: Medicare Other | Attending: Cardiovascular Disease | Admitting: Cardiovascular Disease

## 2019-07-12 ENCOUNTER — Encounter (HOSPITAL_COMMUNITY): Payer: Self-pay | Admitting: *Deleted

## 2019-07-12 ENCOUNTER — Emergency Department (HOSPITAL_COMMUNITY): Payer: Medicare Other

## 2019-07-12 DIAGNOSIS — Z91041 Radiographic dye allergy status: Secondary | ICD-10-CM | POA: Insufficient documentation

## 2019-07-12 DIAGNOSIS — E538 Deficiency of other specified B group vitamins: Secondary | ICD-10-CM | POA: Diagnosis not present

## 2019-07-12 DIAGNOSIS — E785 Hyperlipidemia, unspecified: Secondary | ICD-10-CM | POA: Diagnosis not present

## 2019-07-12 DIAGNOSIS — I2510 Atherosclerotic heart disease of native coronary artery without angina pectoris: Secondary | ICD-10-CM | POA: Diagnosis not present

## 2019-07-12 DIAGNOSIS — Z85828 Personal history of other malignant neoplasm of skin: Secondary | ICD-10-CM | POA: Diagnosis not present

## 2019-07-12 DIAGNOSIS — R0602 Shortness of breath: Secondary | ICD-10-CM | POA: Diagnosis not present

## 2019-07-12 DIAGNOSIS — I429 Cardiomyopathy, unspecified: Secondary | ICD-10-CM | POA: Diagnosis not present

## 2019-07-12 DIAGNOSIS — Z888 Allergy status to other drugs, medicaments and biological substances status: Secondary | ICD-10-CM | POA: Insufficient documentation

## 2019-07-12 DIAGNOSIS — I491 Atrial premature depolarization: Secondary | ICD-10-CM | POA: Insufficient documentation

## 2019-07-12 DIAGNOSIS — R0609 Other forms of dyspnea: Secondary | ICD-10-CM

## 2019-07-12 DIAGNOSIS — Z79899 Other long term (current) drug therapy: Secondary | ICD-10-CM | POA: Insufficient documentation

## 2019-07-12 DIAGNOSIS — Z86010 Personal history of colonic polyps: Secondary | ICD-10-CM | POA: Diagnosis not present

## 2019-07-12 DIAGNOSIS — I700 Atherosclerosis of aorta: Secondary | ICD-10-CM | POA: Diagnosis not present

## 2019-07-12 DIAGNOSIS — N183 Chronic kidney disease, stage 3 unspecified: Secondary | ICD-10-CM | POA: Diagnosis not present

## 2019-07-12 DIAGNOSIS — M810 Age-related osteoporosis without current pathological fracture: Secondary | ICD-10-CM | POA: Diagnosis not present

## 2019-07-12 DIAGNOSIS — Z882 Allergy status to sulfonamides status: Secondary | ICD-10-CM | POA: Insufficient documentation

## 2019-07-12 DIAGNOSIS — H3530 Unspecified macular degeneration: Secondary | ICD-10-CM | POA: Diagnosis not present

## 2019-07-12 DIAGNOSIS — Z87891 Personal history of nicotine dependence: Secondary | ICD-10-CM | POA: Insufficient documentation

## 2019-07-12 DIAGNOSIS — Z7901 Long term (current) use of anticoagulants: Secondary | ICD-10-CM | POA: Diagnosis not present

## 2019-07-12 DIAGNOSIS — I252 Old myocardial infarction: Secondary | ICD-10-CM | POA: Diagnosis not present

## 2019-07-12 DIAGNOSIS — I480 Paroxysmal atrial fibrillation: Principal | ICD-10-CM | POA: Diagnosis present

## 2019-07-12 DIAGNOSIS — Z9841 Cataract extraction status, right eye: Secondary | ICD-10-CM | POA: Insufficient documentation

## 2019-07-12 DIAGNOSIS — M48061 Spinal stenosis, lumbar region without neurogenic claudication: Secondary | ICD-10-CM | POA: Diagnosis not present

## 2019-07-12 DIAGNOSIS — R000 Tachycardia, unspecified: Secondary | ICD-10-CM | POA: Diagnosis not present

## 2019-07-12 DIAGNOSIS — Z20822 Contact with and (suspected) exposure to covid-19: Secondary | ICD-10-CM | POA: Insufficient documentation

## 2019-07-12 DIAGNOSIS — Z8249 Family history of ischemic heart disease and other diseases of the circulatory system: Secondary | ICD-10-CM | POA: Insufficient documentation

## 2019-07-12 DIAGNOSIS — Z87442 Personal history of urinary calculi: Secondary | ICD-10-CM | POA: Insufficient documentation

## 2019-07-12 DIAGNOSIS — Z800 Family history of malignant neoplasm of digestive organs: Secondary | ICD-10-CM | POA: Insufficient documentation

## 2019-07-12 DIAGNOSIS — Z808 Family history of malignant neoplasm of other organs or systems: Secondary | ICD-10-CM | POA: Insufficient documentation

## 2019-07-12 DIAGNOSIS — R0600 Dyspnea, unspecified: Secondary | ICD-10-CM | POA: Diagnosis present

## 2019-07-12 DIAGNOSIS — I130 Hypertensive heart and chronic kidney disease with heart failure and stage 1 through stage 4 chronic kidney disease, or unspecified chronic kidney disease: Secondary | ICD-10-CM | POA: Insufficient documentation

## 2019-07-12 DIAGNOSIS — I4891 Unspecified atrial fibrillation: Secondary | ICD-10-CM | POA: Diagnosis not present

## 2019-07-12 DIAGNOSIS — Z9842 Cataract extraction status, left eye: Secondary | ICD-10-CM | POA: Insufficient documentation

## 2019-07-12 DIAGNOSIS — I10 Essential (primary) hypertension: Secondary | ICD-10-CM | POA: Diagnosis not present

## 2019-07-12 DIAGNOSIS — Z961 Presence of intraocular lens: Secondary | ICD-10-CM | POA: Insufficient documentation

## 2019-07-12 DIAGNOSIS — M4802 Spinal stenosis, cervical region: Secondary | ICD-10-CM | POA: Diagnosis not present

## 2019-07-12 DIAGNOSIS — Z880 Allergy status to penicillin: Secondary | ICD-10-CM | POA: Insufficient documentation

## 2019-07-12 DIAGNOSIS — I5020 Unspecified systolic (congestive) heart failure: Secondary | ICD-10-CM | POA: Diagnosis not present

## 2019-07-12 DIAGNOSIS — I4892 Unspecified atrial flutter: Secondary | ICD-10-CM | POA: Insufficient documentation

## 2019-07-12 DIAGNOSIS — K5900 Constipation, unspecified: Secondary | ICD-10-CM | POA: Diagnosis not present

## 2019-07-12 DIAGNOSIS — Z90710 Acquired absence of both cervix and uterus: Secondary | ICD-10-CM | POA: Insufficient documentation

## 2019-07-12 DIAGNOSIS — R0902 Hypoxemia: Secondary | ICD-10-CM | POA: Diagnosis not present

## 2019-07-12 DIAGNOSIS — Z9861 Coronary angioplasty status: Secondary | ICD-10-CM | POA: Insufficient documentation

## 2019-07-12 LAB — CBC WITH DIFFERENTIAL/PLATELET
Abs Immature Granulocytes: 0.02 10*3/uL (ref 0.00–0.07)
Basophils Absolute: 0 10*3/uL (ref 0.0–0.1)
Basophils Relative: 0 %
Eosinophils Absolute: 0.1 10*3/uL (ref 0.0–0.5)
Eosinophils Relative: 2 %
HCT: 41.3 % (ref 36.0–46.0)
Hemoglobin: 12.8 g/dL (ref 12.0–15.0)
Immature Granulocytes: 0 %
Lymphocytes Relative: 15 %
Lymphs Abs: 1 10*3/uL (ref 0.7–4.0)
MCH: 30.2 pg (ref 26.0–34.0)
MCHC: 31 g/dL (ref 30.0–36.0)
MCV: 97.4 fL (ref 80.0–100.0)
Monocytes Absolute: 0.5 10*3/uL (ref 0.1–1.0)
Monocytes Relative: 8 %
Neutro Abs: 5 10*3/uL (ref 1.7–7.7)
Neutrophils Relative %: 75 %
Platelets: 198 10*3/uL (ref 150–400)
RBC: 4.24 MIL/uL (ref 3.87–5.11)
RDW: 14.9 % (ref 11.5–15.5)
WBC: 6.7 10*3/uL (ref 4.0–10.5)
nRBC: 0 % (ref 0.0–0.2)

## 2019-07-12 LAB — TROPONIN I (HIGH SENSITIVITY)
Troponin I (High Sensitivity): 7 ng/L (ref <18)
Troponin I (High Sensitivity): 8 ng/L (ref <18)

## 2019-07-12 LAB — BASIC METABOLIC PANEL
Anion gap: 9 (ref 5–15)
BUN: 16 mg/dL (ref 8–23)
CO2: 24 mmol/L (ref 22–32)
Calcium: 9.3 mg/dL (ref 8.9–10.3)
Chloride: 111 mmol/L (ref 98–111)
Creatinine, Ser: 1.08 mg/dL — ABNORMAL HIGH (ref 0.44–1.00)
GFR calc Af Amer: 51 mL/min — ABNORMAL LOW (ref >60)
GFR calc non Af Amer: 44 mL/min — ABNORMAL LOW (ref >60)
Glucose, Bld: 125 mg/dL — ABNORMAL HIGH (ref 70–99)
Potassium: 4.2 mmol/L (ref 3.5–5.1)
Sodium: 144 mmol/L (ref 135–145)

## 2019-07-12 LAB — RESPIRATORY PANEL BY RT PCR (FLU A&B, COVID)
Influenza A by PCR: NEGATIVE
Influenza B by PCR: NEGATIVE
SARS Coronavirus 2 by RT PCR: NEGATIVE

## 2019-07-12 LAB — MAGNESIUM: Magnesium: 2 mg/dL (ref 1.7–2.4)

## 2019-07-12 LAB — BRAIN NATRIURETIC PEPTIDE: B Natriuretic Peptide: 651.1 pg/mL — ABNORMAL HIGH (ref 0.0–100.0)

## 2019-07-12 MED ORDER — ONDANSETRON HCL 4 MG/2ML IJ SOLN: 4.0000 mg | Freq: Four times a day (QID) | INTRAMUSCULAR | Status: DC | PRN

## 2019-07-12 MED ORDER — APIXABAN 5 MG PO TABS
5.0000 mg | ORAL_TABLET | Freq: Two times a day (BID) | ORAL | Status: DC
  Administered 2019-07-12 – 2019-07-13 (×4): 5 mg via ORAL
  Filled 2019-07-12 (×6): qty 1

## 2019-07-12 MED ORDER — METOPROLOL SUCCINATE ER 25 MG PO TB24
37.5000 mg | ORAL_TABLET | Freq: Two times a day (BID) | ORAL | Status: DC
  Administered 2019-07-12: 37.5 mg via ORAL
  Filled 2019-07-12: qty 2

## 2019-07-12 MED ORDER — APIXABAN 5 MG PO TABS: 5.0000 mg | ORAL_TABLET | Freq: Two times a day (BID) | ORAL | Status: DC

## 2019-07-12 MED ORDER — ARTIFICIAL TEARS OPHTHALMIC OINT
1.0000 "application " | TOPICAL_OINTMENT | Freq: Every day | OPHTHALMIC | Status: DC
  Administered 2019-07-13 – 2019-07-14 (×2): 1 via OPHTHALMIC
  Filled 2019-07-12: qty 3.5

## 2019-07-12 MED ORDER — SODIUM CHLORIDE 0.9% FLUSH: 3.0000 mL | INTRAVENOUS | Status: DC | PRN

## 2019-07-12 MED ORDER — SODIUM CHLORIDE 0.9 % IV SOLN: 250.0000 mL | INTRAVENOUS | Status: DC | PRN

## 2019-07-12 MED ORDER — METOPROLOL SUCCINATE ER 50 MG PO TB24
50.0000 mg | ORAL_TABLET | Freq: Two times a day (BID) | ORAL | Status: DC
  Administered 2019-07-12: 50 mg via ORAL
  Filled 2019-07-12: qty 1

## 2019-07-12 MED ORDER — ACETAMINOPHEN 325 MG PO TABS: 650.0000 mg | ORAL_TABLET | ORAL | Status: DC | PRN

## 2019-07-12 MED ORDER — SODIUM CHLORIDE 0.9% FLUSH
3.0000 mL | Freq: Two times a day (BID) | INTRAVENOUS | Status: DC
  Administered 2019-07-12 – 2019-07-14 (×5): 3 mL via INTRAVENOUS

## 2019-07-12 NOTE — Progress Notes (Addendum)
Cardiology Consultation:   Patient ID: [id number] LINDSEY MRN: [id number]; DOB: January 05, 1923  Admit date: 07/12/2019 Date of Consult: 07/12/2019  Primary Care Provider: [name], MD Primary Cardiologist: Mertie Moores, MD  Primary Electrophysiologist:  None   Patient Profile:   ABEERA BODEN is a 86 y.o. female with a hx of CAD with PCI in 1994, atrial fibrillation, CKD stage III, hypertension, hyperlipidemia who is being seen today for the evaluation of her fibrillation at the request of Dr. Langston Masker  History of Present Illness:   Ms. Lohmeier is a 86 year old female with the above medical history who presents with atrial fibrillation.  She was seen in the atrial fibrillation clinic on 2/4 as she felt like she went into AF that day. In the office on 2/4, she was noted to be in atrial flutter with ventricular rate 107 bpm.  Her Toprol-XL was increased to 37.5 mg twice daily.  She was continued on Eliquis 5 mg twice daily.  She reports that she has had worsening shortness of breath since that time.  Feels like she has remained in AF.  Presented with worsening shortness of breath today to the ED.  In the ED was noted to be in AF with rates to 110s.  BP elevated, 176/120 on presentation.  Labs notable for creatinine 1.1, BNP 650, high-sensitivity troponin 7, hemoglobin 12.8.  Chest x-ray unremarkable.  She reports that she is having palpitations and dyspnea.  Denies any chest pain.  She was unable to take the Toprol-XL 37.5 mg twice daily as was not able to cut the pills in half, so has been taking Toprol-XL 25 mg 3 times daily.  She denies any missed doses of her Eliquis.  Last echocardiogram in 2017 showed normal global EF (55 to 60%) with regional akinesis of the basal to mid septum and apical inferior wall  Heart Pathway Score:     Past Medical History:  Diagnosis Date  . Aortic ectasia ([place])   . Arm fracture 2013   right, Dr. Sharen Heck  . B12 deficiency   . CAD S/P percutaneous coronary  angioplasty    hx MI 04/ 1994  s/p  PCI to LAD  . CKD (chronic kidney disease), stage III   . Complication of anesthesia    hard to wake   . Gallstones   . History of basal cell carcinoma (BCC) excision    right eye area 08/ 2013  . History of kidney stones 01/20/2017  . History of MI (myocardial infarction) 09/1992   s/p  PCI to LAD  . History of squamous cell carcinoma excision    right lower leg 2015 ;  2016  . Hx of colonic polyps   . Hyperlipidemia   . Hypertension   . Macular degeneration   . Osteoporosis   . PAF (paroxysmal atrial fibrillation) ([place]) dx 02-24-2016   followed by cardiologist-- dr Mamie Nick. [name]  . Pernicious anemia   . PONV (postoperative nausea and vomiting)   . Rib fracture 07/2018   left  . Right ureteral stone   . Wears glasses     Past Surgical History:  Procedure Laterality Date  . ABDOMINAL HYSTERECTOMY    . BREAST EXCISIONAL BIOPSY Left 11/02/2009   fibroadenoma (lumpectomy)  . BROW LIFT Right 04/07/2014   Procedure: REPAIR OF ENTROPION AND TRICHIASIS OF RIGHT LOWER EYE LID ;  Surgeon: Theodoro Kos, DO;  Location: [facility];  Service: Plastics;  Laterality: Right;  . CARDIOVERSION N/A 07/18/2017  Procedure: CARDIOVERSION;  Surgeon: Skeet Latch, MD;  Location: [facility];  Service: Cardiovascular;  Laterality: N/A;  . CATARACT EXTRACTION W/ INTRAOCULAR LENS  IMPLANT, BILATERAL    . CHOLECYSTECTOMY N/A 12/06/2017   Procedure: LAPAROSCOPIC CHOLECYSTECTOMY;  Surgeon: Jovita Kussmaul, MD;  Location: [facility];  Service: General;  Laterality: N/A;  . CORONARY ANGIOPLASTY  04/ 1994  dr Lia Foyer   PCI to LAD  . CORONARY ANGIOPLASTY    . CYSTOSCOPY W/ URETERAL STENT PLACEMENT Right 01/20/2017   Procedure: CYSTOSCOPY WITH RIGHT RETROGRADE PYELOGRAM/RIGHT URETERAL STENT PLACEMENT;  Surgeon: Alexis Frock, MD;  Location: [facility];  Service: Urology;  Laterality: Right;  . CYSTOSCOPY WITH RETROGRADE PYELOGRAM, URETEROSCOPY AND STENT PLACEMENT  Right 02/15/2017   Procedure: CYSTOSCOPY WITH RETROGRADE PYELOGRAM, URETEROSCOPY, STONE BASKETRY AND STENT REPLACEMENT;  Surgeon: Alexis Frock, MD;  Location: [facility];  Service: Urology;  Laterality: Right;  . CYSTOSCOPY/RETROGRADE/URETEROSCOPY/STONE EXTRACTION WITH BASKET  yrs ago  . DILATION AND CURETTAGE OF UTERUS    . HOLMIUM LASER APPLICATION Right [id number]   Procedure: HOLMIUM LASER APPLICATION;  Surgeon: Alexis Frock, MD;  Location: [facility];  Service: Urology;  Laterality: Right;  . KNEE SURGERY     right  . NEUROPLASTY / TRANSPOSITION ULNAR NERVE AT ELBOW Left 02-23-2000    [facility]   and Left Carpal Tunnel Release  . r eyelid cancer    . TONSILLECTOMY    . TRANSTHORACIC ECHOCARDIOGRAM  02/26/2016   moderate focal basal and mild concentric LVH,  ef 55-60%,  akinesis of the basal-midanteroseptal, inferoseptal and apicalinferior myocardium, study not sufficient to evaluation diastolic funciton due to atrial fib./  trivial PR/  mild TR  . UMBILICAL HERNIA REPAIR       Inpatient Medications: Scheduled Meds: . apixaban  5 mg Oral BID   Continuous Infusions:  PRN Meds:   Allergies:    Allergies  Allergen Reactions  . Antihistamines, Chlorpheniramine-Type Other (See Comments)    Pt states that she passed out.   [name] Penicillins Anaphylaxis and Other (See Comments)    Has patient had a PCN reaction causing immediate rash, facial/tongue/throat swelling, SOB or lightheadedness with hypotension: Yes Has patient had a PCN reaction causing severe rash involving mucus membranes or skin necrosis: No Has patient had a PCN reaction that required hospitalization No Has patient had a PCN reaction occurring within the last 10 years: No If all of the above answers are "NO", then may proceed with Cephalosporin use.  . Contrast Media [Iodinated Diagnostic Agents] Hives  . Sulfonamide Derivatives Other (See Comments)    Reaction:  Unknown   . Atorvastatin  Other (See Comments)    myalgias  . Pheniramine Other (See Comments)    Pt states that she passed out.     Social History:   Social History   Socioeconomic History  . Marital status: Widowed    Spouse name: Not on file  . Number of children: 2  . Years of education: Not on file  . Highest education level: Not on file  Occupational History  . Occupation: Retired  Tobacco Use  . Smoking status: Former Smoker    Years: 5.00    Types: Cigarettes  . Smokeless tobacco: Never Used  . Tobacco comment: "didn't have the habit that much", just smoked "with the girls"  Substance and Sexual Activity  . Alcohol use: No    Alcohol/week: 0.0 standard drinks  . Drug use: No  . Sexual activity: Not on file  Other Topics Concern  . Not on file  Social History Narrative   Lives at home alone   Right handed   Social Determinants of Health   Financial Resource Strain:   . Difficulty of Paying Living Expenses: Not on file  Food Insecurity:   . Worried About Charity fundraiser in the Last Year: Not on file  . Ran Out of Food in the Last Year: Not on file  Transportation Needs:   . Lack of Transportation (Medical): Not on file  . Lack of Transportation (Non-Medical): Not on file  Physical Activity:   . Days of Exercise per Week: Not on file  . Minutes of Exercise per Session: Not on file  Stress:   . Feeling of Stress : Not on file  Social Connections:   . Frequency of Communication with Friends and Family: Not on file  . Frequency of Social Gatherings with Friends and Family: Not on file  . Attends Religious Services: Not on file  . Active Member of Clubs or Organizations: Not on file  . Attends Archivist Meetings: Not on file  . Marital Status: Not on file  Intimate Partner Violence:   . Fear of Current or Ex-Partner: Not on file  . Emotionally Abused: Not on file  . Physically Abused: Not on file  . Sexually Abused: Not on file    Family History:    Family  History  Problem Relation Age of Onset  . Coronary artery disease Other        positive family hx of  . Cancer Other        family hx of  . Brain cancer Son   . Liver cancer Daughter   . Neuropathy Neg Hx      ROS:  Please see the history of present illness.   All other ROS reviewed and negative.     Physical Exam/Data:   Vitals:   07/12/19 0945 07/12/19 1000 07/12/19 1015 07/12/19 1030  BP: (!) 159/116 (!) 149/116 (!) 166/110 (!) 142/109  Pulse: 78 (!) 101 96 64  Resp: 11 14 12 12   Temp:      TempSrc:      SpO2: 97% 98% 96% 96%  Weight:      Height:       No intake or output data in the 24 hours ending 07/12/19 1215 Last 3 Weights 07/12/2019 07/09/2019 02/04/2019  Weight (lbs) 154 lb 156 lb 6.4 oz 157 lb 12.8 oz  Weight (kg) 69.854 kg 70.943 kg 71.578 kg     Body mass index is 25.24 kg/m.  General:  elderly, in no acute distress HEENT: normal Lymph: no adenopathy Neck: no JVD Endocrine:  No thryomegaly Vascular: No carotid bruits; FA pulses 2+ bilaterally without bruits  Cardiac:  normal S1, S2; RRR; no murmur  Lungs:  clear to auscultation bilaterally Abd: soft, nontender, no hepatomegaly  Ext: no edema Musculoskeletal:  No deformities, BUE and BLE strength normal and equal Skin: warm and dry  Neuro:  CNs 2-12 intact, no focal abnormalities noted Psych:  Normal affect   EKG:  The EKG was personally reviewed and demonstrates:  AF, rate 113bpm, artifact Telemetry:  Telemetry was personally reviewed and demonstrates:  AF, rates 90-110s  Relevant CV Studies: TTE 02/06/16: - Left ventricle: The cavity size was normal. There was moderate  focal basal and mild concentric hypertrophy. Systolic function  was normal. The estimated ejection fraction was in the range of  55% to 60%.  There is akinesis of the basal-midanteroseptal and  inferoseptal myocardium. There is akinesis of the apicalinferior  myocardium.   Laboratory Data:  High Sensitivity Troponin:     Recent Labs  Lab 07/12/19 0929  TROPONINIHS 7     Chemistry Recent Labs  Lab 07/12/19 0929  NA 144  K 4.2  CL 111  CO2 24  GLUCOSE 125*  BUN 16  CREATININE 1.08*  CALCIUM 9.3  GFRNONAA 44*  GFRAA 51*  ANIONGAP 9    No results for input(s): PROT, ALBUMIN, AST, ALT, ALKPHOS, BILITOT in the last 168 hours. Hematology Recent Labs  Lab 07/12/19 0929  WBC 6.7  RBC 4.24  HGB 12.8  HCT 41.3  MCV 97.4  MCH 30.2  MCHC 31.0  RDW 14.9  PLT 198   BNP Recent Labs  Lab 07/12/19 0929  BNP 651.1*    DDimer No results for input(s): DDIMER in the last 168 hours.   Radiology/Studies:  DG Chest Port 1 View  Result Date: 07/12/2019 CLINICAL DATA:  86 year old female with shortness of breath for 3 days. Recent left 10th rib fracture in a fall. EXAM: PORTABLE CHEST 1 VIEW COMPARISON:  Chest, rib radiographs 07/08/2018 and earlier. FINDINGS: Portable AP upright view at 0918 hours. Stable lung volumes. No pneumothorax or pleural effusion. No acute pulmonary opacity. Tortuous thoracic aorta. Stable cardiac size and mediastinal contours. Visualized tracheal air column is within normal limits. Stable visualized osseous structures. Negative visible bowel gas pattern. IMPRESSION: No acute cardiopulmonary abnormality. Electronically Signed   By: Genevie Ann M.D.   On: 07/12/2019 09:30   { Assessment and Plan:   Atrial fibrillation: Paroxysmal.  Currently in AF with rates 90s to 110s.  Symptomatic, reports worsening dyspnea and palpitations.  CHADS-VASc score 4, has not missed any Eliquis doses. -Given worsening symptoms despite adequate rate control, recommend cardioversion.  Discussed that we could admit her and plan for DCCV tomorrow or she could have close f/u with AF clinic and plan DCCV as outpatient.  Given her worsening symptoms, she is not comfortable with going home and prefers admission.  Will admit, continue toprol XL and Eliquis, and plan for DCCV tomorrow  HTN: BP has been elevated,  will increase toprol XL to 50 mg BID  For questions or updates, please contact [facility] Please consult [email or web address] for contact info under     Signed, Donato Heinz, MD  07/12/2019 12:15 PM

## 2019-07-12 NOTE — ED Notes (Signed)
Walked patient to bathroom. Patient Sats sitting on bedside 97% RA With HR 113, After walking back from bathroom, Sats 94% RA with OR:[id number]. With complaint of being. shortness of breath.

## 2019-07-12 NOTE — ED Triage Notes (Signed)
PT reports SHOB started when Pt started taking 3 tabs a day of Metoprolol .

## 2019-07-12 NOTE — ED Triage Notes (Signed)
PT reports SHOB since Thursday with and without activity. Pt reported her PCP  Increased the metoprolol 25 mg To 3 times a day .  Pt last saw PCP Thursday.

## 2019-07-12 NOTE — ED Provider Notes (Signed)
[facility] EMERGENCY DEPARTMENT Provider Note   CSN: [id number] Arrival date & time: 07/12/19  [id number]     History Chief Complaint  Patient presents with  . Irregular Heart Beat    Shelly Obrien is a 86 y.o. female.  Shelly Obrien is a 86 y.o. female with a history of paroxysmal A. fib, hypertension, hyperlipidemia, MI, CKD, who presents to the emergency department reporting shortness of breath and fatigue.  Patient reports that about 2 weeks ago she began having increasing episodes of her A. fib and started feeling more short of breath, she increased her metoprolol with some improvement.  She was seen at the A. fib clinic on Thursday 2/4 and they increased her metoprolol further.  Patient reports despite increasing her medications she is continued to have frequent episodes of A. fib.  During these episodes she has severe dyspnea on exertion feels generally weak and fatigued.  She denies any associated chest pain.  She reports shortness of breath is occasionally present at rest but is much worse with activity, patient states that she lives alone and has difficulty getting around her house due to the shortness of breath.  She has not had any falls.  She denies any pain or swelling in her legs.  No other aggravating or alleviating factors.  She has not had any fevers or cough with shortness of breath, no known sick contacts.        Past Medical History:  Diagnosis Date  . Aortic ectasia ([place])   . Arm fracture 2013   right, Dr. Sharen Heck  . B12 deficiency   . CAD S/P percutaneous coronary angioplasty    hx MI 04/ 1994  s/p  PCI to LAD  . CKD (chronic kidney disease), stage III   . Complication of anesthesia    hard to wake   . Gallstones   . History of basal cell carcinoma (BCC) excision    right eye area 08/ 2013  . History of kidney stones 01/20/2017  . History of MI (myocardial infarction) 09/1992   s/p  PCI to LAD  . History of squamous cell carcinoma excision      right lower leg 2015 ;  2016  . Hx of colonic polyps   . Hyperlipidemia   . Hypertension   . Macular degeneration   . Osteoporosis   . PAF (paroxysmal atrial fibrillation) ([place]) dx 02-24-2016   followed by cardiologist-- dr Mamie Nick. [name]  . Pernicious anemia   . PONV (postoperative nausea and vomiting)   . Rib fracture 07/2018   left  . Right ureteral stone   . Wears glasses     Patient Active Problem List   Diagnosis Date Noted  . Peripheral neuropathy 08/12/2018  . B12 deficiency 08/12/2018  . Constipation   . Acute on chronic cholecystitis s/p lap cholecystectomy 12/06/2017 12/04/2017  . Nephrolithiasis 12/04/2017  . Abdominal pain 12/04/2017  . Spinal stenosis in cervical region 04/24/2017  . Spinal stenosis of lumbar region 04/24/2017  . Dizziness 04/24/2017  . Ureteral stone with hydronephrosis 01/20/2017  . Atrial fibrillation with RVR ([place]) 02/24/2016  . CKD (chronic kidney disease) stage 3, GFR 30-59 ml/min (HCC) 02/24/2016  . Paroxysmal atrial fibrillation ([place]) 02/24/2016  . 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 CHOLELITHIASIS 02/14/2009  . HYPERLIPIDEMIA 12/29/2008  . Essential hypertension 12/29/2008  . MYOCARDIAL INFARCTION, HX OF 12/29/2008  . CAD (coronary artery disease) 12/29/2008    Past Surgical History:  Procedure Laterality Date  .  ABDOMINAL HYSTERECTOMY    . BREAST EXCISIONAL BIOPSY Left 11/02/2009   fibroadenoma (lumpectomy)  . BROW LIFT Right 04/07/2014   Procedure: REPAIR OF ENTROPION AND TRICHIASIS OF RIGHT LOWER EYE LID ;  Surgeon: Theodoro Kos, DO;  Location: [facility];  Service: Plastics;  Laterality: Right;  . CARDIOVERSION N/A 07/18/2017   Procedure: CARDIOVERSION;  Surgeon: Skeet Latch, MD;  Location: [place];  Service: Cardiovascular;  Laterality: N/A;  . CATARACT EXTRACTION W/ INTRAOCULAR LENS  IMPLANT, BILATERAL    . CHOLECYSTECTOMY N/A 12/06/2017   Procedure: LAPAROSCOPIC CHOLECYSTECTOMY;  Surgeon:  Jovita Kussmaul, MD;  Location: [facility];  Service: General;  Laterality: N/A;  . CORONARY ANGIOPLASTY  04/ 1994  dr Lia Foyer   PCI to LAD  . CORONARY ANGIOPLASTY    . CYSTOSCOPY W/ URETERAL STENT PLACEMENT Right 01/20/2017   Procedure: CYSTOSCOPY WITH RIGHT RETROGRADE PYELOGRAM/RIGHT URETERAL STENT PLACEMENT;  Surgeon: Alexis Frock, MD;  Location: [facility];  Service: Urology;  Laterality: Right;  . CYSTOSCOPY WITH RETROGRADE PYELOGRAM, URETEROSCOPY AND STENT PLACEMENT Right 02/15/2017   Procedure: CYSTOSCOPY WITH RETROGRADE PYELOGRAM, URETEROSCOPY, STONE BASKETRY AND STENT REPLACEMENT;  Surgeon: Alexis Frock, MD;  Location[facility];  Service: Urology;  Laterality: Right;  . CYSTOSCOPY/RETROGRADE/URETEROSCOPY/STONE EXTRACTION WITH BASKET  yrs ago  . DILATION AND CURETTAGE OF UTERUS    . HOLMIUM LASER APPLICATION Right [id number]   Procedure: HOLMIUM LASER APPLICATION;  Surgeon: Alexis Frock, MD;  Location: [facility];  Service: Urology;  Laterality: Right;  . KNEE SURGERY     right  . NEUROPLASTY / TRANSPOSITION ULNAR NERVE AT ELBOW Left 02-23-2000    [facility]   and Left Carpal Tunnel Release  . r eyelid cancer    . TONSILLECTOMY    . TRANSTHORACIC ECHOCARDIOGRAM  02/26/2016   moderate focal basal and mild concentric LVH,  ef 55-60%,  akinesis of the basal-midanteroseptal, inferoseptal and apicalinferior myocardium, study not sufficient to evaluation diastolic funciton due to atrial fib./  trivial PR/  mild TR  . UMBILICAL HERNIA REPAIR       OB History   No obstetric history on file.     Family History  Problem Relation Age of Onset  . Coronary artery disease Other        positive family hx of  . Cancer Other        family hx of  . Brain cancer Son   . Liver cancer Daughter   . Neuropathy Neg Hx     Social History   Tobacco Use  . Smoking status: Former Smoker    Years: 5.00    Types: Cigarettes  . Smokeless tobacco: Never Used  . Tobacco  comment: "didn't have the habit that much", just smoked "with the girls"  Substance Use Topics  . Alcohol use: No    Alcohol/week: 0.0 standard drinks  . Drug use: No    Home Medications Prior to Admission medications   Medication Sig Start Date End Date Taking? Authorizing Provider  ELIQUIS 5 MG TABS tablet TAKE 1 TABLET BY MOUTH TWICE DAILY 12/31/18   Sherran Needs, NP  fluocinonide (LIDEX) 0.05 % external solution APPLY SMALL AMOUNT EXTERNALLY TO THE SCALP DAILY AS NEEDED FOR ITCHING 06/15/19   [provider]  metoprolol succinate (TOPROL-XL) 25 MG 24 hr tablet Take 37.5mg  (1 and 1/2 tablets) by mouth twice a day 07/09/19   Sherran Needs, NP  Multiple Vitamins-Minerals (ICAPS) CAPS Take by mouth 2 (two) times  daily.     [provider]  triamcinolone (KENALOG) Q000111Q % cream 1 application apply on the skin twice a day  1 application apply on the skin twice a day [place] up to 2 weeks on, 1 week off suring flares only 06/15/19   [provider]  triamcinolone cream (KENALOG) 0.1 % APPLY A SMALL AMOUNT TOPICALLY TO THE AFFECTED AREA TWICE DAILY 06/15/19   [provider]  White Petrolatum-Mineral Oil [facility] PETROL-MINERAL OIL-LANOLIN) 0.1-0.1 % OINT  06/11/14   [provider]    Allergies    Antihistamines, chlorpheniramine-type; Penicillins; Contrast media [iodinated diagnostic agents]; Sulfonamide derivatives; Atorvastatin; and Pheniramine  Review of Systems   Review of Systems  Constitutional: Negative for chills and fever.  HENT: Negative.   Respiratory: Positive for shortness of breath. Negative for cough and chest tightness.   Cardiovascular: Positive for palpitations. Negative for chest pain and leg swelling.  Gastrointestinal: Negative for abdominal pain, nausea and vomiting.  Musculoskeletal: Negative for arthralgias and myalgias.  Skin: Negative for color change and rash.  Neurological: Positive for weakness (Generalized). Negative for  dizziness, syncope, light-headedness and numbness.    Physical Exam Updated Vital Signs BP (!) 176/120 (BP Location: Left Arm)   Pulse 89   Temp 97.9 F (36.8 C) (Oral)   Resp 16   Ht 5' 5.5" (1.664 m)   Wt 71.5 kg   SpO2 99%   BMI 25.83 kg/m   Physical Exam Vitals and nursing note reviewed.  Constitutional:      General: She is not in acute distress.    Appearance: Normal appearance. She is well-developed and normal weight. She is not ill-appearing or diaphoretic.  HENT:     Head: Normocephalic and atraumatic.  Eyes:     General:        Right eye: No discharge.        Left eye: No discharge.  Cardiovascular:     Rate and Rhythm: Normal rate. Rhythm irregular.     Pulses: Normal pulses.     Heart sounds: Normal heart sounds. No murmur. No friction rub. No gallop.      Comments: Rate controlled with irregularly irregular rhythm Pulmonary:     Effort: Pulmonary effort is normal. No respiratory distress.     Breath sounds: Normal breath sounds.     Comments: Respirations equal and unlabored, patient able to speak in full sentences, lungs clear to auscultation bilaterally, no wheezes, rales or rhonchi Abdominal:     General: Abdomen is flat. Bowel sounds are normal. There is no distension.     Palpations: Abdomen is soft. There is no mass.     Tenderness: There is no abdominal tenderness. There is no guarding.  Musculoskeletal:        General: No deformity.     Cervical back: Neck supple.     Right lower leg: No edema.     Left lower leg: No edema.  Skin:    General: Skin is warm and dry.  Neurological:     Mental Status: She is alert and oriented to person, place, and time.     Coordination: Coordination normal.  Psychiatric:        Behavior: Behavior normal.     ED Results / Procedures / Treatments   Labs (all labs ordered are listed, but only abnormal results are displayed) Labs Reviewed  BASIC METABOLIC PANEL - Abnormal; Notable for the following components:       Result Value   Glucose,  Bld 125 (*)    Creatinine, Ser 1.08 (*)    GFR calc non Af Amer 44 (*)    GFR calc Af Amer 51 (*)    All other components within normal limits  BRAIN NATRIURETIC PEPTIDE - Abnormal; Notable for the following components:   B Natriuretic Peptide 651.1 (*)    All other components within normal limits  RESPIRATORY PANEL BY RT PCR (FLU A&B, COVID)  CBC WITH DIFFERENTIAL/PLATELET  MAGNESIUM  TROPONIN I (HIGH SENSITIVITY)  TROPONIN I (HIGH SENSITIVITY)    EKG EKG Interpretation  Date/Time:  Sunday July 12 2019 09:02:52 EST Ventricular Rate:  113 PR Interval:    QRS Duration: 114 QT Interval:  363 QTC Calculation: 498 R Axis:   -66 Text Interpretation: Atrial fibrillation Left anterior fascicular block Abnormal R-wave progression, early transition Left ventricular hypertrophy Borderline prolonged QT interval Artifact in lead(s) I II aVR aVL aVF No STEMI, no significant change from prior ecg Confirmed by Trifan, Matthew ([id number]) on 07/12/2019 9:52:02 AM   Radiology DG Chest Port 1 View  Result Date: 07/12/2019 CLINICAL DATA:  86 year old female with shortness of breath for 3 days. Recent left 10th rib fracture in a fall. EXAM: PORTABLE CHEST 1 VIEW COMPARISON:  Chest, rib radiographs 07/08/2018 and earlier. FINDINGS: Portable AP upright view at 0918 hours. Stable lung volumes. No pneumothorax or pleural effusion. No acute pulmonary opacity. Tortuous thoracic aorta. Stable cardiac size and mediastinal contours. Visualized tracheal air column is within normal limits. Stable visualized osseous structures. Negative visible bowel gas pattern. IMPRESSION: No acute cardiopulmonary abnormality. Electronically Signed   By: H  Hall M.D.   On: 07/12/2019 09:30    Procedures Procedures (including critical care time)  Medications Ordered in ED Medications  apixaban (ELIQUIS) tablet 5 mg (5 mg Oral Given 07/12/19 1302)  metoprolol succinate (TOPROL-XL) 24 hr tablet 37.5  mg (has no administration in time range)  artificial tears (LACRILUBE) ophthalmic ointment 1 application (has no administration in time range)  acetaminophen (TYLENOL) tablet 650 mg (has no administration in time range)  ondansetron (ZOFRAN) injection 4 mg (has no administration in time range)  sodium chloride flush (NS) 0.9 % injection 3 mL (has no administration in time range)  sodium chloride flush (NS) 0.9 % injection 3 mL (has no administration in time range)  0.9 %  sodium chloride infusion (has no administration in time range)    ED Course  I have reviewed the triage vital signs and the nursing notes.  Pertinent labs & imaging results that were available during my care of the patient were reviewed by me and considered in my medical decision making (see chart for details).    MDM Rules/Calculators/A&P                     86  year old female with history of paroxysmal A. fib presents with worsening episodes of A. fib causing shortness of breath and generalized weakness and fatigue.  Symptoms have been worsening despite recently increasing her metoprolol after being seen in the A. fib clinic on 2/4.  Patient reports she becomes dyspneic with even small amounts of exertion.  She lives alone and is afraid she is going to fall, has difficulty getting around her house due to the symptoms.  No associated chest pain.  No lower extremity edema noted.  On arrival patient is in A. fib, appears to be fairly well rate controlled, occasional rates in the low 100s.  Will get basic labs, troponin, BNP, chest  x-ray and EKG and discuss with cardiology.  Patient has been cardioverted in the past, is currently anticoagulated on Eliquis.  Lab work is reassuring, no leukocytosis and normal hemoglobin, glucose of 125 and creatinine of 1.08, no other electrolyte derangements.  Troponin is not elevated, EKG shows A. fib without other concerning changes.  BNP is 561.1 which is at baseline from previous checks.  Chest  x-ray is clear without evidence of pulmonary edema or other active cardiopulmonary disease.  Case discussed with Dr. Gardiner Rhyme with cardiology who will come down and see and evaluate the patient.  Patient ambulated in the hallway, became tachycardic up to the 120s but did not have any desaturations, does report dyspnea on exertion.  Dr. Gardiner Rhyme at bedside to see and evaluate patient has elected to admit patient to cardiology service with plans for cardioversion in the morning.  Covid test ordered for admission.  Final Clinical Impression(s) / ED Diagnoses Final diagnoses:  Atrial fibrillation, unspecified type [facility])  Dyspnea on exertion    Rx / DC Orders ED Discharge Orders    None       Jacqlyn Larsen, [place] 07/13/19 0830    Wyvonnia Dusky, MD 07/13/19 [phone]

## 2019-07-13 ENCOUNTER — Encounter (HOSPITAL_COMMUNITY)
Admission: EM | Disposition: A | Discharge status: Home / Self Care | Payer: Self-pay | Source: Home / Self Care | Attending: Emergency Medicine

## 2019-07-13 ENCOUNTER — Observation Stay (HOSPITAL_COMMUNITY): Payer: Medicare Other | Admitting: Anesthesiology

## 2019-07-13 ENCOUNTER — Observation Stay (HOSPITAL_BASED_OUTPATIENT_CLINIC_OR_DEPARTMENT_OTHER): Payer: Medicare Other

## 2019-07-13 DIAGNOSIS — I5020 Unspecified systolic (congestive) heart failure: Secondary | ICD-10-CM | POA: Diagnosis not present

## 2019-07-13 DIAGNOSIS — I10 Essential (primary) hypertension: Secondary | ICD-10-CM | POA: Diagnosis not present

## 2019-07-13 DIAGNOSIS — I4892 Unspecified atrial flutter: Secondary | ICD-10-CM | POA: Diagnosis not present

## 2019-07-13 DIAGNOSIS — I2510 Atherosclerotic heart disease of native coronary artery without angina pectoris: Secondary | ICD-10-CM | POA: Diagnosis not present

## 2019-07-13 DIAGNOSIS — I491 Atrial premature depolarization: Secondary | ICD-10-CM | POA: Diagnosis not present

## 2019-07-13 DIAGNOSIS — I480 Paroxysmal atrial fibrillation: Secondary | ICD-10-CM

## 2019-07-13 DIAGNOSIS — Z20822 Contact with and (suspected) exposure to covid-19: Secondary | ICD-10-CM | POA: Diagnosis not present

## 2019-07-13 DIAGNOSIS — I429 Cardiomyopathy, unspecified: Secondary | ICD-10-CM | POA: Diagnosis not present

## 2019-07-13 DIAGNOSIS — I4891 Unspecified atrial fibrillation: Secondary | ICD-10-CM | POA: Diagnosis not present

## 2019-07-13 DIAGNOSIS — E538 Deficiency of other specified B group vitamins: Secondary | ICD-10-CM | POA: Diagnosis not present

## 2019-07-13 DIAGNOSIS — E785 Hyperlipidemia, unspecified: Secondary | ICD-10-CM | POA: Diagnosis not present

## 2019-07-13 DIAGNOSIS — I252 Old myocardial infarction: Secondary | ICD-10-CM | POA: Diagnosis not present

## 2019-07-13 DIAGNOSIS — N183 Chronic kidney disease, stage 3 unspecified: Secondary | ICD-10-CM | POA: Diagnosis not present

## 2019-07-13 DIAGNOSIS — I130 Hypertensive heart and chronic kidney disease with heart failure and stage 1 through stage 4 chronic kidney disease, or unspecified chronic kidney disease: Secondary | ICD-10-CM | POA: Diagnosis not present

## 2019-07-13 HISTORY — PX: CARDIOVERSION: SHX1299

## 2019-07-13 LAB — BASIC METABOLIC PANEL
Anion gap: 12 (ref 5–15)
BUN: 14 mg/dL (ref 8–23)
CO2: 27 mmol/L (ref 22–32)
Calcium: 9.3 mg/dL (ref 8.9–10.3)
Chloride: 105 mmol/L (ref 98–111)
Creatinine, Ser: 1.05 mg/dL — ABNORMAL HIGH (ref 0.44–1.00)
GFR calc Af Amer: 53 mL/min — ABNORMAL LOW (ref >60)
GFR calc non Af Amer: 45 mL/min — ABNORMAL LOW (ref >60)
Glucose, Bld: 120 mg/dL — ABNORMAL HIGH (ref 70–99)
Potassium: 4 mmol/L (ref 3.5–5.1)
Sodium: 144 mmol/L (ref 135–145)

## 2019-07-13 LAB — ECHOCARDIOGRAM COMPLETE
Height: 65.5 in
Weight: 2521.6 oz

## 2019-07-13 LAB — TSH: TSH: 0.917 u[IU]/mL (ref 0.350–4.500)

## 2019-07-13 SURGERY — CARDIOVERSION
Anesthesia: General

## 2019-07-13 MED ORDER — SODIUM CHLORIDE 0.9 % IV SOLN: INTRAVENOUS | Status: DC

## 2019-07-13 MED ORDER — LOSARTAN POTASSIUM 50 MG PO TABS
50.0000 mg | ORAL_TABLET | Freq: Every day | ORAL | Status: DC
  Filled 2019-07-13: qty 1

## 2019-07-13 MED ORDER — METOPROLOL SUCCINATE ER 50 MG PO TB24
50.0000 mg | ORAL_TABLET | Freq: Two times a day (BID) | ORAL | Status: DC
  Administered 2019-07-13 – 2019-07-14 (×3): 50 mg via ORAL
  Filled 2019-07-13 (×3): qty 1

## 2019-07-13 MED ORDER — LIDOCAINE HCL (CARDIAC) PF 100 MG/5ML IV SOSY
PREFILLED_SYRINGE | INTRAVENOUS | Status: DC | PRN
  Administered 2019-07-13: 60 mg via INTRATRACHEAL

## 2019-07-13 MED ORDER — LOSARTAN POTASSIUM 25 MG PO TABS
25.0000 mg | ORAL_TABLET | Freq: Every day | ORAL | Status: DC
  Administered 2019-07-13: 16:00:00 25 mg via ORAL

## 2019-07-13 MED ORDER — METOPROLOL TARTRATE 5 MG/5ML IV SOLN
INTRAVENOUS | Status: AC
  Filled 2019-07-13: qty 5

## 2019-07-13 MED ORDER — HYDRALAZINE HCL 20 MG/ML IJ SOLN: 10.0000 mg | Freq: Four times a day (QID) | INTRAMUSCULAR | Status: DC | PRN

## 2019-07-13 MED ORDER — HYDRALAZINE HCL 20 MG/ML IJ SOLN
INTRAMUSCULAR | Status: AC
  Filled 2019-07-13: qty 1

## 2019-07-13 MED ORDER — HYDRALAZINE HCL 10 MG PO TABS
10.0000 mg | ORAL_TABLET | Freq: Four times a day (QID) | ORAL | Status: DC | PRN
  Administered 2019-07-13: 10 mg via ORAL
  Filled 2019-07-13: qty 1

## 2019-07-13 MED ORDER — HYDRALAZINE HCL 20 MG/ML IJ SOLN
10.0000 mg | Freq: Once | INTRAMUSCULAR | Status: AC
  Administered 2019-07-13: 10 mg via INTRAVENOUS

## 2019-07-13 MED ORDER — METOPROLOL TARTRATE 5 MG/5ML IV SOLN
5.0000 mg | Freq: Once | INTRAVENOUS | Status: AC
  Administered 2019-07-13: 5 mg via INTRAVENOUS

## 2019-07-13 MED ORDER — SODIUM CHLORIDE 0.9 % IV SOLN: INTRAVENOUS | Status: DC | PRN

## 2019-07-13 MED ORDER — PROPOFOL 10 MG/ML IV BOLUS
INTRAVENOUS | Status: DC | PRN
  Administered 2019-07-13: 50 mg via INTRAVENOUS

## 2019-07-13 NOTE — Progress Notes (Addendum)
Progress Note  Patient Name: Shelly Obrien Date of Encounter: 07/13/2019  Primary Cardiologist: Mertie Moores, MD   Subjective   The patient continues to have shortness of breath and palpitations with walking to the bathroom, but asymptomatic at rest. No chest pain.   Inpatient Medications    Scheduled Meds: . apixaban  5 mg Oral BID  . artificial tears  1 application Both Eyes Daily  . metoprolol succinate  50 mg Oral BID  . sodium chloride flush  3 mL Intravenous Q12H   Continuous Infusions: . sodium chloride     PRN Meds: sodium chloride, acetaminophen, ondansetron (ZOFRAN) IV, sodium chloride flush   Vital Signs    Vitals:   07/13/19 0421 07/13/19 0458 07/13/19 0506 07/13/19 0559  BP: (!) 144/108 (!) 159/124 (!) 150/123 (!) 166/123  Pulse: 94 (!) 139 (!) 127 68  Resp: 15 15 15 15   Temp: 97.8 F (36.6 C)     TempSrc: Oral     SpO2: 93% 94% 95% 95%  Weight: 71.5 kg     Height:        Intake/Output Summary (Last 24 hours) at 07/13/2019 0748 Last data filed at 07/13/2019 0106 Gross per 24 hour  Intake 363 ml  Output 250 ml  Net 113 ml   Last 3 Weights 07/13/2019 07/12/2019 07/12/2019  Weight (lbs) 157 lb 9.6 oz 157 lb 9.6 oz 154 lb  Weight (kg) 71.487 kg 71.487 kg 69.854 kg      Telemetry    A. fib in the 100s- Personally Reviewed  ECG    No new tracings for review.  Physical Exam   GEN: No acute distress.   Neck: No JVD Cardiac:  Irregularly irregular rhythm, mildly tachycardic, no murmurs, rubs, or gallops.  Respiratory: Clear to auscultation bilaterally. GI: Soft, nontender, non-distended  MS: No edema; No deformity. Neuro:  Nonfocal  Psych: Normal affect   Labs    High Sensitivity Troponin:   Recent Labs  Lab 07/12/19 0929 07/12/19 1151  TROPONINIHS 7 8      Chemistry Recent Labs  Lab 07/12/19 0929 07/13/19 0213  NA 144 144  K 4.2 4.0  CL 111 105  CO2 24 27  GLUCOSE 125* 120*  BUN 16 14  CREATININE 1.08* 1.05*  CALCIUM 9.3 9.3    GFRNONAA 44* 45*  GFRAA 51* 53*  ANIONGAP 9 12     Hematology Recent Labs  Lab 07/12/19 0929  WBC 6.7  RBC 4.24  HGB 12.8  HCT 41.3  MCV 97.4  MCH 30.2  MCHC 31.0  RDW 14.9  PLT 198    BNP Recent Labs  Lab 07/12/19 0929  BNP 651.1*     DDimer No results for input(s): DDIMER in the last 168 hours.   Radiology    DG Chest Port 1 View  Result Date: 07/12/2019 CLINICAL DATA:  86 year old female with shortness of breath for 3 days. Recent left 10th rib fracture in a fall. EXAM: PORTABLE CHEST 1 VIEW COMPARISON:  Chest, rib radiographs 07/08/2018 and earlier. FINDINGS: Portable AP upright view at 0918 hours. Stable lung volumes. No pneumothorax or pleural effusion. No acute pulmonary opacity. Tortuous thoracic aorta. Stable cardiac size and mediastinal contours. Visualized tracheal air column is within normal limits. Stable visualized osseous structures. Negative visible bowel gas pattern. IMPRESSION: No acute cardiopulmonary abnormality. Electronically Signed   By: Genevie Ann M.D.   On: 07/12/2019 09:30    Cardiac Studies   Echocardiogram pending  Patient  Profile     86 y.o. female with a hx of CAD with PCI in 1994, atrial fibrillation, CKD stage III, hypertension, hyperlipidemia who presented with worsening shortness of breath and A. fib with RVR. Patient felt like she had been out of rhythm for about a week. Increase in metoprolol as an outpatient did not help her symptoms.  Assessment & Plan   Paroxysmal atrial fibrillation -Continues to be in A. fib with rates in the low 100s. -CHA2DS2-VASc score of 4. Patient is on Eliquis for stroke risk reduction. Patient has reportedly not missed any doses of Eliquis. -TSH in the low normal range. -The patient had been switched from diltiazem to metoprolol in the spring due to a rash. -Patient is continued on Toprol-XL for rate control with increase in dose to 50 mg. -Plan for DCCV today if available  time.  Hypertension -Patient is only on Toprol-XL at home which was recently increased for heart rate control. First dose of the increased dose was last night. -Blood pressure has been elevated. -Will add prn hydralazine for today and reassess BP after DCCV. Would consider adding a low dose of ARB.     For questions or updates, please contact [place] Please consult [email or web address] for contact info under        Signed, Daune Perch, NP  07/13/2019, 7:48 AM    Patient sen and examined.  Agree with above documentation.  On exam, patient is alert and oriented, tachycardic and irregular, no murmurs, lungs CTAB, no LE edema or JVD.  Plan for cardioversion today.  Patient denies any missed doses of Eliquis.  BP has been elevated, started on prn hydralazine and will likely add losartan pending BP after cardioversion.  Donato Heinz, MD

## 2019-07-13 NOTE — Anesthesia Procedure Notes (Signed)
Procedure Name: General with mask airway Date/Time: 07/13/2019 1:47 PM Performed by: Kathryne Hitch, CRNA Pre-anesthesia Checklist: Patient identified, Emergency Drugs available, Suction available and Patient being monitored Patient Re-evaluated:Patient Re-evaluated prior to induction Oxygen Delivery Method: Ambu bag Preoxygenation: Pre-oxygenation with 100% oxygen Induction Type: IV induction Dental Injury: Teeth and Oropharynx as per pre-operative assessment

## 2019-07-13 NOTE — Interval H&P Note (Signed)
History and Physical Interval Note:  07/13/2019 12:47 PM  Shelly Obrien  has presented today for surgery, with the diagnosis of afib.  The various methods of treatment have been discussed with the patient and family. After consideration of risks, benefits and other options for treatment, the patient has consented to  Procedure(s): CARDIOVERSION (N/A) as a surgical intervention.  The patient's history has been reviewed, patient examined, no change in status, stable for surgery.  I have reviewed the patient's chart and labs.  Questions were answered to the patient's satisfaction.     Jenkins Rouge

## 2019-07-13 NOTE — Progress Notes (Signed)
  Echocardiogram 2D Echocardiogram has been performed.  Shelly Obrien 07/13/2019, 9:32 AM

## 2019-07-13 NOTE — Transfer of Care (Signed)
Immediate Anesthesia Transfer of Care Note  Patient: Shelly Obrien  Procedure(s) Performed: CARDIOVERSION (N/A )  Patient Location: Endoscopy Unit  Anesthesia Type:General  Level of Consciousness: drowsy and patient cooperative  Airway & Oxygen Therapy: Patient Spontanous Breathing  Post-op Assessment: Report given to RN and Post -op Vital signs reviewed and stable  Post vital signs: Reviewed and stable  Last Vitals:  Vitals Value Taken Time  BP    Temp    Pulse    Resp    SpO2      Last Pain:  Vitals:   07/13/19 1316  TempSrc: Oral  PainSc: 0-No pain         Complications: No apparent anesthesia complications

## 2019-07-13 NOTE — H&P (View-Only) (Signed)
Progress Note  Patient Name: Shelly Obrien Date of Encounter: 07/13/2019  Primary Cardiologist: Mertie Moores, MD   Subjective   The patient continues to have shortness of breath and palpitations with walking to the bathroom, but asymptomatic at rest. No chest pain.   Inpatient Medications    Scheduled Meds: . apixaban  5 mg Oral BID  . artificial tears  1 application Both Eyes Daily  . metoprolol succinate  50 mg Oral BID  . sodium chloride flush  3 mL Intravenous Q12H   Continuous Infusions: . sodium chloride     PRN Meds: sodium chloride, acetaminophen, ondansetron (ZOFRAN) IV, sodium chloride flush   Vital Signs    Vitals:   07/13/19 0421 07/13/19 0458 07/13/19 0506 07/13/19 0559  BP: (!) 144/108 (!) 159/124 (!) 150/123 (!) 166/123  Pulse: 94 (!) 139 (!) 127 68  Resp: 15 15 15 15   Temp: 97.8 F (36.6 C)     TempSrc: Oral     SpO2: 93% 94% 95% 95%  Weight: 71.5 kg     Height:        Intake/Output Summary (Last 24 hours) at 07/13/2019 0748 Last data filed at 07/13/2019 0106 Gross per 24 hour  Intake 363 ml  Output 250 ml  Net 113 ml   Last 3 Weights 07/13/2019 07/12/2019 07/12/2019  Weight (lbs) 157 lb 9.6 oz 157 lb 9.6 oz 154 lb  Weight (kg) 71.487 kg 71.487 kg 69.854 kg      Telemetry    A. fib in the 100s- Personally Reviewed  ECG    No new tracings for review.  Physical Exam   GEN: No acute distress.   Neck: No JVD Cardiac:  Irregularly irregular rhythm, mildly tachycardic, no murmurs, rubs, or gallops.  Respiratory: Clear to auscultation bilaterally. GI: Soft, nontender, non-distended  MS: No edema; No deformity. Neuro:  Nonfocal  Psych: Normal affect   Labs    High Sensitivity Troponin:   Recent Labs  Lab 07/12/19 0929 07/12/19 1151  TROPONINIHS 7 8      Chemistry Recent Labs  Lab 07/12/19 0929 07/13/19 0213  NA 144 144  K 4.2 4.0  CL 111 105  CO2 24 27  GLUCOSE 125* 120*  BUN 16 14  CREATININE 1.08* 1.05*  CALCIUM 9.3 9.3    GFRNONAA 44* 45*  GFRAA 51* 53*  ANIONGAP 9 12     Hematology Recent Labs  Lab 07/12/19 0929  WBC 6.7  RBC 4.24  HGB 12.8  HCT 41.3  MCV 97.4  MCH 30.2  MCHC 31.0  RDW 14.9  PLT 198    BNP Recent Labs  Lab 07/12/19 0929  BNP 651.1*     DDimer No results for input(s): DDIMER in the last 168 hours.   Radiology    DG Chest Port 1 View  Result Date: 07/12/2019 CLINICAL DATA:  86 year old female with shortness of breath for 3 days. Recent left 10th rib fracture in a fall. EXAM: PORTABLE CHEST 1 VIEW COMPARISON:  Chest, rib radiographs 07/08/2018 and earlier. FINDINGS: Portable AP upright view at 0918 hours. Stable lung volumes. No pneumothorax or pleural effusion. No acute pulmonary opacity. Tortuous thoracic aorta. Stable cardiac size and mediastinal contours. Visualized tracheal air column is within normal limits. Stable visualized osseous structures. Negative visible bowel gas pattern. IMPRESSION: No acute cardiopulmonary abnormality. Electronically Signed   By: Genevie Ann M.D.   On: 07/12/2019 09:30    Cardiac Studies   Echocardiogram pending  Patient  Profile     86 y.o. female with a hx of CAD with PCI in 1994, atrial fibrillation, CKD stage III, hypertension, hyperlipidemia who presented with worsening shortness of breath and A. fib with RVR. Patient felt like she had been out of rhythm for about a week. Increase in metoprolol as an outpatient did not help her symptoms.  Assessment & Plan   Paroxysmal atrial fibrillation -Continues to be in A. fib with rates in the low 100s. -CHA2DS2-VASc score of 4. Patient is on Eliquis for stroke risk reduction. Patient has reportedly not missed any doses of Eliquis. -TSH in the low normal range. -The patient had been switched from diltiazem to metoprolol in the spring due to a rash. -Patient is continued on Toprol-XL for rate control with increase in dose to 50 mg. -Plan for DCCV today if available  time.  Hypertension -Patient is only on Toprol-XL at home which was recently increased for heart rate control. First dose of the increased dose was last night. -Blood pressure has been elevated. -Will add prn hydralazine for today and reassess BP after DCCV. Would consider adding a low dose of ARB.     For questions or updates, please contact [place] Please consult [email or web address] for contact info under        Signed, Daune Perch, NP  07/13/2019, 7:48 AM    Patient sen and examined.  Agree with above documentation.  On exam, patient is alert and oriented, tachycardic and irregular, no murmurs, lungs CTAB, no LE edema or JVD.  Plan for cardioversion today.  Patient denies any missed doses of Eliquis.  BP has been elevated, started on prn hydralazine and will likely add losartan pending BP after cardioversion.  Donato Heinz, MD

## 2019-07-13 NOTE — Anesthesia Preprocedure Evaluation (Addendum)
Anesthesia Evaluation  Patient identified by MRN, date of birth, ID band Patient awake    Reviewed: Allergy & Precautions, NPO status , Patient's Chart, lab work & pertinent test results  Airway Mallampati: II  TM Distance: >3 FB Neck ROM: Full    Dental no notable dental hx.    Pulmonary neg pulmonary ROS, former smoker,    Pulmonary exam normal breath sounds clear to auscultation       Cardiovascular hypertension, + CAD, + Past MI and + Cardiac Stents  + dysrhythmias Atrial Fibrillation  Rhythm:Irregular Rate:Normal     Neuro/Psych negative neurological ROS  negative psych ROS   GI/Hepatic negative GI ROS, Neg liver ROS,   Endo/Other  negative endocrine ROS  Renal/GU Renal InsufficiencyRenal disease  negative genitourinary   Musculoskeletal negative musculoskeletal ROS (+)   Abdominal   Peds negative pediatric ROS (+)  Hematology negative hematology ROS (+)   Anesthesia Other Findings   Reproductive/Obstetrics negative OB ROS                            Anesthesia Physical Anesthesia Plan  ASA: III  Anesthesia Plan: General   Post-op Pain Management:    Induction: Intravenous  PONV Risk Score and Plan: 0  Airway Management Planned: Mask  Additional Equipment:   Intra-op Plan:   Post-operative Plan:   Informed Consent: I have reviewed the patients History and Physical, chart, labs and discussed the procedure including the risks, benefits and alternatives for the proposed anesthesia with the patient or authorized representative who has indicated his/her understanding and acceptance.     Dental advisory given  Plan Discussed with: CRNA and Surgeon  Anesthesia Plan Comments:         Anesthesia Quick Evaluation

## 2019-07-13 NOTE — Progress Notes (Signed)
Pt's.B/P=150/123.MD on call [place] made aware.ordered just to observe pt.

## 2019-07-13 NOTE — CV Procedure (Signed)
[facility]: Anesthesia: Dr Kalman Shan  On Rx anticoagulation eliquis with no missed doses  DCC x 2 120 -> 200 J biphasic Converted from afib rate 118 to NSR rate 78 with frequent PAC;s  No immediate neurologic sequelae  Jenkins Rouge

## 2019-07-13 NOTE — Anesthesia Postprocedure Evaluation (Signed)
Anesthesia Post Note  Patient: Shelly Obrien  Procedure(s) Performed: CARDIOVERSION (N/A )     Patient location during evaluation: PACU Anesthesia Type: General Level of consciousness: awake and alert Pain management: pain level controlled Vital Signs Assessment: post-procedure vital signs reviewed and stable Respiratory status: spontaneous breathing, nonlabored ventilation, respiratory function stable and patient connected to nasal cannula oxygen Cardiovascular status: blood pressure returned to baseline and stable Postop Assessment: no apparent nausea or vomiting Anesthetic complications: no    Last Vitals:  Vitals:   07/13/19 1404 07/13/19 1407  BP: (!) 143/90 137/86  Pulse: 73 67  Resp: 18 18  Temp:  37 C  SpO2: 94% 92%    Last Pain:  Vitals:   07/13/19 1407  TempSrc: Axillary  PainSc: Asleep                 [name]

## 2019-07-14 ENCOUNTER — Other Ambulatory Visit: Payer: Self-pay | Admitting: Medical

## 2019-07-14 ENCOUNTER — Ambulatory Visit (HOSPITAL_COMMUNITY): Payer: Medicare Other | Admitting: Nurse Practitioner

## 2019-07-14 DIAGNOSIS — I480 Paroxysmal atrial fibrillation: Secondary | ICD-10-CM | POA: Diagnosis not present

## 2019-07-14 DIAGNOSIS — I5021 Acute systolic (congestive) heart failure: Secondary | ICD-10-CM

## 2019-07-14 DIAGNOSIS — I5020 Unspecified systolic (congestive) heart failure: Secondary | ICD-10-CM

## 2019-07-14 DIAGNOSIS — N183 Chronic kidney disease, stage 3 unspecified: Secondary | ICD-10-CM | POA: Diagnosis not present

## 2019-07-14 DIAGNOSIS — I4892 Unspecified atrial flutter: Secondary | ICD-10-CM | POA: Diagnosis not present

## 2019-07-14 DIAGNOSIS — E785 Hyperlipidemia, unspecified: Secondary | ICD-10-CM | POA: Diagnosis not present

## 2019-07-14 DIAGNOSIS — Z20822 Contact with and (suspected) exposure to covid-19: Secondary | ICD-10-CM | POA: Diagnosis not present

## 2019-07-14 DIAGNOSIS — I4891 Unspecified atrial fibrillation: Secondary | ICD-10-CM | POA: Diagnosis not present

## 2019-07-14 DIAGNOSIS — I429 Cardiomyopathy, unspecified: Secondary | ICD-10-CM

## 2019-07-14 LAB — BASIC METABOLIC PANEL
Anion gap: 10 (ref 5–15)
BUN: 15 mg/dL (ref 8–23)
CO2: 27 mmol/L (ref 22–32)
Calcium: 9.3 mg/dL (ref 8.9–10.3)
Chloride: 103 mmol/L (ref 98–111)
Creatinine, Ser: 0.96 mg/dL (ref 0.44–1.00)
GFR calc Af Amer: 59 mL/min — ABNORMAL LOW (ref >60)
GFR calc non Af Amer: 51 mL/min — ABNORMAL LOW (ref >60)
Glucose, Bld: 119 mg/dL — ABNORMAL HIGH (ref 70–99)
Potassium: 4 mmol/L (ref 3.5–5.1)
Sodium: 140 mmol/L (ref 135–145)

## 2019-07-14 MED ORDER — METOPROLOL SUCCINATE ER 50 MG PO TB24: 50.0000 mg | ORAL_TABLET | Freq: Two times a day (BID) | ORAL | 6 refills | Status: DC

## 2019-07-14 MED ORDER — FUROSEMIDE 20 MG PO TABS: 20.0000 mg | ORAL_TABLET | Freq: Every day | ORAL | 0 refills | Status: DC

## 2019-07-14 MED ORDER — APIXABAN 5 MG PO TABS
5.0000 mg | ORAL_TABLET | Freq: Two times a day (BID) | ORAL | Status: DC
  Administered 2019-07-14: 5 mg via ORAL
  Filled 2019-07-14: qty 1

## 2019-07-14 MED ORDER — LOSARTAN POTASSIUM 50 MG PO TABS: 50.0000 mg | ORAL_TABLET | Freq: Every day | ORAL | 6 refills | Status: DC

## 2019-07-14 MED ORDER — LOSARTAN POTASSIUM 50 MG PO TABS
50.0000 mg | ORAL_TABLET | Freq: Every day | ORAL | Status: DC
  Administered 2019-07-14: 50 mg via ORAL
  Filled 2019-07-14: qty 1

## 2019-07-14 MED ORDER — FUROSEMIDE 20 MG PO TABS: 20.0000 mg | ORAL_TABLET | Freq: Every day | ORAL | Status: DC

## 2019-07-14 NOTE — Discharge Summary (Addendum)
Discharge Summary    Patient ID: GENEVIA VOLPE MRN: [id number]; DOB: [birth year]  Admit date: 07/12/2019 Discharge date: 07/14/2019  Primary Care Provider: [name], MD  Primary Cardiologist: Mertie Moores, MD  Primary Electrophysiologist:  None   Discharge Diagnoses    Active Problems:   Hyperlipidemia   Essential hypertension   CAD (coronary artery disease)   Atrial fibrillation with RVR (HCC)   CKD (chronic kidney disease) stage 3, GFR 30-59 ml/min (HCC)   PAF (paroxysmal atrial fibrillation) (HCC)   Cardiomyopathy (HCC)   Systolic CHF ([place])   Diagnostic Studies/Procedures    Echocardiogram 07/13/19 1. Left ventricular ejection fraction, by estimation, is 25 to 30%. The  left ventricle has severely decreased function. The left ventricle  demonstrates regional wall motion abnormalities (see scoring  diagram/findings for description). Diffuse  hypokinesis with inferoseptal akinesis. Left ventricular diastolic  parameters are indeterminate.  2. Right ventricular systolic function is normal. There is moderately  elevated pulmonary artery systolic pressure. The estimated right  ventricular systolic pressure is 123456 mmHg.  3. Mild mitral valve regurgitation.  4. The aortic valve is tricuspid. Aortic sclerosis without significant  stenosis. No regurgitation.  5. Dilated IVC with estimate RA pressure 15 mmHg.  6. The patient was in atrial fibrillation.  DCCV 07/13/19 [place]: Anesthesia: Dr Kalman Shan  On Rx anticoagulation eliquis with no missed doses  DCC x 2 120 -> 200 J biphasic Converted from afib rate 118 to NSR rate 78 with frequent PAC;s  No immediate neurologic sequelae  Jenkins Rouge _____________   History of Present Illness     LARSYN LOSA is a 86 y.o. female with a history of CAD with PCI in 1994, paroxysmal afib, HTN, HLD, MI, CKD stage III who presented to the ED 07/12/19 for shortness of breath and fatigue. She started feeling bad about 2 weeks ago and  was noticing increased episodes of Afib. She was feeling more short of breath and increased her metoprolol with some improvement. She was seen at the Afib clinic 07/09/19 and they increased her metoprolol further. Despite the increase she continued to have frequent episodes of Afib where she would feel severely short of breath on exertion and general weakness so she went to the ED.   Hospital Course     Consultants: None  In the ED she was noted to in Afib with rates to 110s. She denied chest pain and lower leg edema. She denied missing doses of Eliquis. BP was high 176/120. Labs were notable for creatinine 1.1, BNP 650, HS troponin 7, Hgb 12.8. CXR was unremarkable. Shre reported palpitations and dyspnea. The patient was admitted and scheduled for cardioversion. TSH was normal. Toprol was increased to 50 mg. The patient underwent successful cardioversion converting to NSR with rates in the 70s. Overnight the patient remained in NSR with rates in the 60-70s. Her breathing improved. BP was up and Losartan was added. Echo showed new low FE 25-30%, regional wall motion abnormalities, mild MR, and dilated IVC. Will plan for further ischemic work-up as outpatient given that patient will need to continue with uninterrupted anticoagulation for at least 4 weeks. Plan to send in new prescription of Losartan 50 mg daily and Toprol 50 mcg daily. Continue ELiquis 5 mg BID. On exam patient appeared to be mildly volume up. Will send in Lasix 20 mg to take for 3 days with follow up BMET and BNP in 3 days. Close outpatient follow-up was arranged.   The patient was evaluated by Dr.  Gardiner Rhyme 07/14/19 and felt to be stable for discharge.   Did the patient have an acute coronary syndrome (MI, NSTEMI, STEMI, etc) this admission?:  No                               Did the patient have a percutaneous coronary intervention (stent / angioplasty)?:  No.   _____________  Discharge Vitals Blood pressure (!) 158/95, pulse 74,  temperature 98.4 F (36.9 C), temperature source Oral, resp. rate 20, height 5' 5.5" (1.664 m), weight 70.8 kg, SpO2 90 %.  Filed Weights   07/12/19 1536 07/13/19 0421 07/14/19 0414  Weight: 71.5 kg 71.5 kg 70.8 kg    Labs & Radiologic Studies    CBC Recent Labs    07/12/19 0929  WBC 6.7  NEUTROABS 5.0  HGB 12.8  HCT 41.3  MCV 97.4  PLT 99991111   Basic Metabolic Panel Recent Labs    07/12/19 0929 07/12/19 0929 07/13/19 0213 07/14/19 0859  NA 144   < > 144 140  K 4.2   < > 4.0 4.0  CL 111   < > 105 103  CO2 24   < > 27 27  GLUCOSE 125*   < > 120* 119*  BUN 16   < > 14 15  CREATININE 1.08*   < > 1.05* 0.96  CALCIUM 9.3   < > 9.3 9.3  MG 2.0  --   --   --    < > = values in this interval not displayed.   Liver Function Tests No results for input(s): AST, ALT, ALKPHOS, BILITOT, PROT, ALBUMIN in the last 72 hours. No results for input(s): LIPASE, AMYLASE in the last 72 hours. High Sensitivity Troponin:   Recent Labs  Lab 07/12/19 0929 07/12/19 1151  TROPONINIHS 7 8    BNP Invalid input(s): POCBNP D-Dimer No results for input(s): DDIMER in the last 72 hours. Hemoglobin A1C No results for input(s): HGBA1C in the last 72 hours. Fasting Lipid Panel No results for input(s): CHOL, HDL, LDLCALC, TRIG, CHOLHDL, LDLDIRECT in the last 72 hours. Thyroid Function Tests Recent Labs    07/13/19 0213  TSH 0.917   _____________  DG Chest Port 1 View  Result Date: 07/12/2019 CLINICAL DATA:  86 year old female with shortness of breath for 3 days. Recent left 10th rib fracture in a fall. EXAM: PORTABLE CHEST 1 VIEW COMPARISON:  Chest, rib radiographs 07/08/2018 and earlier. FINDINGS: Portable AP upright view at 0918 hours. Stable lung volumes. No pneumothorax or pleural effusion. No acute pulmonary opacity. Tortuous thoracic aorta. Stable cardiac size and mediastinal contours. Visualized tracheal air column is within normal limits. Stable visualized osseous structures. Negative  visible bowel gas pattern. IMPRESSION: No acute cardiopulmonary abnormality. Electronically Signed   By: Genevie Ann M.D.   On: 07/12/2019 09:30   ECHOCARDIOGRAM COMPLETE  Result Date: 07/13/2019    ECHOCARDIOGRAM REPORT   Patient Name:   TATELYN TULLIO Date of Exam: 07/13/2019 Medical Rec #:  [id number]     Height:       65.5 in Accession #:    [id number]    Weight:       157.6 lb Date of Birth:  01/30/1923     BSA:          1.80 m Patient Age:    86 years      BP:  137/89 mmHg Patient Gender: F             HR:           110 bpm. Exam Location:  Inpatient Procedure: 2D Echo Indications:    atrial fibrillation 427.31  History:        Patient has prior history of Echocardiogram examinations, most                 recent 02/26/2016. Arrythmias:Atrial Fibrillation.  Sonographer:    Johny Chess Referring Phys: [id number] CHRISTOPHER RONALD [place]  1. Left ventricular ejection fraction, by estimation, is 25 to 30%. The left ventricle has severely decreased function. The left ventricle demonstrates regional wall motion abnormalities (see scoring diagram/findings for description). Diffuse hypokinesis with inferoseptal akinesis. Left ventricular diastolic parameters are indeterminate.  2. Right ventricular systolic function is normal. There is moderately elevated pulmonary artery systolic pressure. The estimated right ventricular systolic pressure is 123456 mmHg.  3. Mild mitral valve regurgitation.  4. The aortic valve is tricuspid. Aortic sclerosis without significant stenosis. No regurgitation.  5. Dilated IVC with estimate RA pressure 15 mmHg.  6. The patient was in atrial fibrillation. FINDINGS  Left Ventricle: Left ventricular ejection fraction, by estimation, is 25 to 30%. The left ventricle has severely decreased function. The left ventricle demonstrates regional wall motion abnormalities. The left ventricular internal cavity size was normal  in size. There is no left ventricular hypertrophy. Right  Ventricle: The right ventricular size is normal. No increase in right ventricular wall thickness. Right ventricular systolic function is normal. There is moderately elevated pulmonary artery systolic pressure. The tricuspid regurgitant velocity is 3.35 m/s, and with an assumed right atrial pressure of 15 mmHg, the estimated right ventricular systolic pressure is 123456 mmHg. Left Atrium: Left atrial size was normal in size. Right Atrium: Right atrial size was normal in size. Pericardium: Trivial pericardial effusion is present. Mitral Valve: The mitral valve is normal in structure and function. Mild mitral valve regurgitation. No evidence of mitral valve stenosis. Tricuspid Valve: The tricuspid valve is normal in structure. Tricuspid valve regurgitation is trivial. Aortic Valve: The aortic valve is tricuspid. Aortic valve regurgitation is not visualized. Mild to moderate aortic valve sclerosis/calcification is present, without any evidence of aortic stenosis. Aortic valve mean gradient measures 4.4 mmHg. Aortic valve peak gradient measures 7.0 mmHg. Aortic valve area, by VTI measures 1.59 cm. Pulmonic Valve: The pulmonic valve was normal in structure. Pulmonic valve regurgitation is not visualized. Aorta: The aortic root is normal in size and structure. Venous: The inferior vena cava is dilated in size with less than 50% respiratory variability, suggesting right atrial pressure of 15 mmHg. IAS/Shunts: No atrial level shunt detected by color flow Doppler.  LEFT VENTRICLE PLAX 2D LVIDd:         5.10 cm LVIDs:         4.20 cm LV PW:         1.20 cm LV IVS:        1.10 cm LVOT diam:     2.10 cm LV SV:         33.15 ml LV SV Index:   24.70 LVOT Area:     3.46 cm  LEFT ATRIUM           Index       RIGHT ATRIUM           Index LA diam:      4.20 cm 2.34 cm/m  RA Area:  14.60 cm LA Vol (A2C): 46.6 ml 25.92 ml/m RA Volume:   32.20 ml  17.91 ml/m LA Vol (A4C): 51.6 ml 28.70 ml/m  AORTIC VALVE AV Area (Vmax):    1.69  cm AV Area (Vmean):   1.58 cm AV Area (VTI):     1.59 cm AV Vmax:           132.00 cm/s AV Vmean:          99.620 cm/s AV VTI:            0.209 m AV Peak Grad:      7.0 mmHg AV Mean Grad:      4.4 mmHg LVOT Vmax:         64.46 cm/s LVOT Vmean:        45.360 cm/s LVOT VTI:          0.096 m LVOT/AV VTI ratio: 0.46  AORTA Ao Root diam: 3.70 cm TRICUSPID VALVE TR Peak grad:   44.9 mmHg TR Vmax:        335.00 cm/s  SHUNTS Systemic VTI:  0.10 m Systemic Diam: 2.10 cm Loralie Champagne MD Electronically signed by Loralie Champagne MD Signature Date/Time: 07/13/2019/3:39:07 PM    Final    Disposition   Pt is being discharged home today in good condition.  Follow-up Plans & Appointments    Follow-up Information    Nahser, Wonda Cheng, MD Follow up on 07/27/2019.   Specialty: Cardiology Why: Please go to hospital follo-up on February 22nd at 1:40 PM Contact information: [place] [address] [phone]        [facility] Follow up on 07/20/2019.   Specialty: Cardiology Why: Please go to hospital follow-up Monday the 15th at 1:30 PM Contact information: [id number] [place] [phone]       [facility] [facility] Follow up on 07/17/2019.   Specialty: Cardiology Why: Please go to [facility] on [address] for blood work Friday 7:30 AM to 4:30 PM  Contact information: [address], [place] [phone]           Discharge Medications   Allergies as of 07/14/2019      Reactions   Antihistamines, Chlorpheniramine-type Other (See Comments)   Pt states that she passed out.    Penicillins Anaphylaxis, Other (See Comments)   Has patient had a PCN reaction causing immediate rash, facial/tongue/throat swelling, SOB or lightheadedness with hypotension: Yes Has patient had a PCN reaction causing severe rash involving mucus membranes or skin  necrosis: No Has patient had a PCN reaction that required hospitalization No Has patient had a PCN reaction occurring within the last 10 years: No If all of the above answers are "NO", then may proceed with Cephalosporin use.   Contrast Media [iodinated Diagnostic Agents] Hives   Sulfonamide Derivatives Other (See Comments)   Reaction:  Unknown    Atorvastatin Other (See Comments)   myalgias   Pheniramine Other (See Comments)   Pt states that she passed out.       Medication List    TAKE these medications   Eliquis 5 MG Tabs tablet Generic drug: apixaban TAKE 1 TABLET BY MOUTH TWICE DAILY What changed: how much to take   fluocinonide 0.05 % external solution Commonly known as: LIDEX Apply 1 application topically as needed (itching).   furosemide 20 MG tablet Commonly known as: LASIX Take 1 tablet (  20 mg total) by mouth daily for 3 days.   ICaps Caps Take 1 capsule by mouth 2 (two) times daily.   losartan 50 MG tablet Commonly known as: COZAAR Take 1 tablet (50 mg total) by mouth daily. Start taking on: July 15, 2019   metoprolol succinate 50 MG 24 hr tablet Commonly known as: TOPROL-XL Take 1 tablet (50 mg total) by mouth 2 (two) times daily. Take with or immediately following a meal. What changed:   medication strength  how much to take  how to take this  when to take this  additional instructions   NOSE DROPS NA Place 1 drop into the nose daily as needed (alleriges).   triamcinolone cream 0.1 % Commonly known as: KENALOG Apply 1 application topically 2 (two) times daily.   triamcinolone 0.025 % cream Commonly known as: KENALOG Apply 1 application topically 2 (two) times daily.   Wh Petrol-Mineral Oil-Lanolin 0.1-0.1 % Oint Place 1 drop into both eyes daily.          Outstanding Labs/Studies   BMET and BNP  Duration of Discharge Encounter   Greater than 30 minutes including physician time.  Signed, Lotus Gover Ninfa Meeker, PA-C 07/14/2019,  11:32 AM

## 2019-07-14 NOTE — Progress Notes (Signed)
Discharge instructions given. Pt verbalized understanding and all questions were answered.  

## 2019-07-14 NOTE — Progress Notes (Addendum)
Progress Note  Patient Name: Shelly Obrien Date of Encounter: 07/14/2019  Primary Cardiologist: Mertie Moores, MD   Subjective   Patient remains in NSR with rates in the 60-70s. Echo with new low EF. No chest pain  Inpatient Medications    Scheduled Meds: . apixaban  5 mg Oral BID  . artificial tears  1 application Both Eyes Daily  . losartan  25 mg Oral Daily  . metoprolol succinate  50 mg Oral BID  . sodium chloride flush  3 mL Intravenous Q12H   Continuous Infusions: . sodium chloride     PRN Meds: sodium chloride, acetaminophen, hydrALAZINE, hydrALAZINE, ondansetron (ZOFRAN) IV, sodium chloride flush   Vital Signs    Vitals:   07/13/19 1949 07/13/19 2004 07/13/19 2111 07/14/19 0414  BP: 133/85  (!) 137/99 (!) 151/99  Pulse: 68  80 79  Resp: 14   13  Temp:  98.3 F (36.8 C)  98.1 F (36.7 C)  TempSrc:  Oral  Oral  SpO2: 95%   95%  Weight:    70.8 kg  Height:        Intake/Output Summary (Last 24 hours) at 07/14/2019 0723 Last data filed at 07/13/2019 2007 Gross per 24 hour  Intake 780 ml  Output --  Net 780 ml   Last 3 Weights 07/14/2019 07/13/2019 07/12/2019  Weight (lbs) 156 lb 157 lb 9.6 oz 157 lb 9.6 oz  Weight (kg) 70.761 kg 71.487 kg 71.487 kg      Telemetry    NSR, HR 60-70s; frequent PVCs - Personally Reviewed  ECG    pending - Personally Reviewed  Physical Exam   GEN: No acute distress.   Neck: No JVD Cardiac: RRR, no murmurs, rubs, or gallops.  Respiratory: Clear to auscultation bilaterally. GI: Soft, nontender, non-distended  MS: No edema; No deformity. Neuro:  Nonfocal  Psych: Normal affect   Labs    High Sensitivity Troponin:   Recent Labs  Lab 07/12/19 0929 07/12/19 1151  TROPONINIHS 7 8      Chemistry Recent Labs  Lab 07/12/19 0929 07/13/19 0213  NA 144 144  K 4.2 4.0  CL 111 105  CO2 24 27  GLUCOSE 125* 120*  BUN 16 14  CREATININE 1.08* 1.05*  CALCIUM 9.3 9.3  GFRNONAA 44* 45*  GFRAA 51* 53*  ANIONGAP 9 12      Hematology Recent Labs  Lab 07/12/19 0929  WBC 6.7  RBC 4.24  HGB 12.8  HCT 41.3  MCV 97.4  MCH 30.2  MCHC 31.0  RDW 14.9  PLT 198    BNP Recent Labs  Lab 07/12/19 0929  BNP 651.1*     DDimer No results for input(s): DDIMER in the last 168 hours.   Radiology    DG Chest Port 1 View  Result Date: 07/12/2019 CLINICAL DATA:  86 year old female with shortness of breath for 3 days. Recent left 10th rib fracture in a fall. EXAM: PORTABLE CHEST 1 VIEW COMPARISON:  Chest, rib radiographs 07/08/2018 and earlier. FINDINGS: Portable AP upright view at 0918 hours. Stable lung volumes. No pneumothorax or pleural effusion. No acute pulmonary opacity. Tortuous thoracic aorta. Stable cardiac size and mediastinal contours. Visualized tracheal air column is within normal limits. Stable visualized osseous structures. Negative visible bowel gas pattern. IMPRESSION: No acute cardiopulmonary abnormality. Electronically Signed   By: Genevie Ann M.D.   On: 07/12/2019 09:30   ECHOCARDIOGRAM COMPLETE  Result Date: 07/13/2019    ECHOCARDIOGRAM REPORT  Patient Name:   Shelly Obrien Date of Exam: 07/13/2019 Medical Rec #:  [id number]     Height:       65.5 in Accession #:    [id number]    Weight:       157.6 lb Date of Birth:  January 20, 1923     BSA:          1.80 m Patient Age:    86 years      BP:           137/89 mmHg Patient Gender: F             HR:           110 bpm. Exam Location:  Inpatient Procedure: 2D Echo Indications:    atrial fibrillation 427.31  History:        Patient has prior history of Echocardiogram examinations, most                 recent 02/26/2016. Arrythmias:Atrial Fibrillation.  Sonographer:    Johny Chess Referring Phys: [id number] Jakeim Sedore RONALD Harvey  1. Left ventricular ejection fraction, by estimation, is 25 to 30%. The left ventricle has severely decreased function. The left ventricle demonstrates regional wall motion abnormalities (see scoring diagram/findings for  description). Diffuse hypokinesis with inferoseptal akinesis. Left ventricular diastolic parameters are indeterminate.  2. Right ventricular systolic function is normal. There is moderately elevated pulmonary artery systolic pressure. The estimated right ventricular systolic pressure is 123456 mmHg.  3. Mild mitral valve regurgitation.  4. The aortic valve is tricuspid. Aortic sclerosis without significant stenosis. No regurgitation.  5. Dilated IVC with estimate RA pressure 15 mmHg.  6. The patient was in atrial fibrillation. FINDINGS  Left Ventricle: Left ventricular ejection fraction, by estimation, is 25 to 30%. The left ventricle has severely decreased function. The left ventricle demonstrates regional wall motion abnormalities. The left ventricular internal cavity size was normal  in size. There is no left ventricular hypertrophy. Right Ventricle: The right ventricular size is normal. No increase in right ventricular wall thickness. Right ventricular systolic function is normal. There is moderately elevated pulmonary artery systolic pressure. The tricuspid regurgitant velocity is 3.35 m/s, and with an assumed right atrial pressure of 15 mmHg, the estimated right ventricular systolic pressure is 123456 mmHg. Left Atrium: Left atrial size was normal in size. Right Atrium: Right atrial size was normal in size. Pericardium: Trivial pericardial effusion is present. Mitral Valve: The mitral valve is normal in structure and function. Mild mitral valve regurgitation. No evidence of mitral valve stenosis. Tricuspid Valve: The tricuspid valve is normal in structure. Tricuspid valve regurgitation is trivial. Aortic Valve: The aortic valve is tricuspid. Aortic valve regurgitation is not visualized. Mild to moderate aortic valve sclerosis/calcification is present, without any evidence of aortic stenosis. Aortic valve mean gradient measures 4.4 mmHg. Aortic valve peak gradient measures 7.0 mmHg. Aortic valve area, by VTI  measures 1.59 cm. Pulmonic Valve: The pulmonic valve was normal in structure. Pulmonic valve regurgitation is not visualized. Aorta: The aortic root is normal in size and structure. Venous: The inferior vena cava is dilated in size with less than 50% respiratory variability, suggesting right atrial pressure of 15 mmHg. IAS/Shunts: No atrial level shunt detected by color flow Doppler.  LEFT VENTRICLE PLAX 2D LVIDd:         5.10 cm LVIDs:         4.20 cm LV PW:         1.20 cm LV  IVS:        1.10 cm LVOT diam:     2.10 cm LV SV:         33.15 ml LV SV Index:   24.70 LVOT Area:     3.46 cm  LEFT ATRIUM           Index       RIGHT ATRIUM           Index LA diam:      4.20 cm 2.34 cm/m  RA Area:     14.60 cm LA Vol (A2C): 46.6 ml 25.92 ml/m RA Volume:   32.20 ml  17.91 ml/m LA Vol (A4C): 51.6 ml 28.70 ml/m  AORTIC VALVE AV Area (Vmax):    1.69 cm AV Area (Vmean):   1.58 cm AV Area (VTI):     1.59 cm AV Vmax:           132.00 cm/s AV Vmean:          99.620 cm/s AV VTI:            0.209 m AV Peak Grad:      7.0 mmHg AV Mean Grad:      4.4 mmHg LVOT Vmax:         64.46 cm/s LVOT Vmean:        45.360 cm/s LVOT VTI:          0.096 m LVOT/AV VTI ratio: 0.46  AORTA Ao Root diam: 3.70 cm TRICUSPID VALVE TR Peak grad:   44.9 mmHg TR Vmax:        335.00 cm/s  SHUNTS Systemic VTI:  0.10 m Systemic Diam: 2.10 cm Loralie Champagne MD Electronically signed by Loralie Champagne MD Signature Date/Time: 07/13/2019/3:39:07 PM    Final     Cardiac Studies   Echo 07/13/19 1. Left ventricular ejection fraction, by estimation, is 25 to 30%. The  left ventricle has severely decreased function. The left ventricle  demonstrates regional wall motion abnormalities (see scoring  diagram/findings for description). Diffuse  hypokinesis with inferoseptal akinesis. Left ventricular diastolic  parameters are indeterminate.  2. Right ventricular systolic function is normal. There is moderately  elevated pulmonary artery systolic pressure. The  estimated right  ventricular systolic pressure is 123456 mmHg.  3. Mild mitral valve regurgitation.  4. The aortic valve is tricuspid. Aortic sclerosis without significant  stenosis. No regurgitation.  5. Dilated IVC with estimate RA pressure 15 mmHg.  6. The patient was in atrial fibrillation  Patient Profile     86 y.o. female with hx of CAD with PCI in 1994, afib, , CKD stage III, HTN, HLD who is being evaluated for Afib RVR.   Assessment & Plan    Paroxysmal Afib - Patient underwent successful DCCV yesterday - Patient remains in sinus with rates 60-70s with PVCs - continue Eliquis   HTN - Toprol XL was increased - BP still up - Losartan added>>increased  New low EF/Cardiomyopathy - Echo in 2017 showed  EF 55-60%. This admission echo showed EF of 25-30%, regional wall motion abnormalities, moderately elevated pulmonary artery sytolic pressure, mild MR, dilated IVC - Patient has h/o of CA with PCI in 1994. She denies CP but does have dyspnea on exertion - Given new cardiomyopathy patient will need further ischemic work-up as OP. Repeat echo to evaluate in 4 weeks  For questions or updates, please contact [place] Please consult [email or web address] for contact info under        Signed, Cadence  Ninfa Meeker, PA-C  07/14/2019, 7:23 AM    Patient seen and examined.  On exam, patient is alert and oriented, regular rate and rhythm, no murmurs, lungs with mild bibasilar crackles, no LE edema, mild JVD.  TTE yesterday showed EF 25-30%, RVSP 60, mild MR, RAP 15.  Successful DCCV to NSR yesterday.  Reports feeling significantly improved, back to baseline.  Will plan for discharge today.  For her AF, will continue Eliquis and toprol XL.  Will schedule f/u in AF clinic.  For her new systolic heart failure, continue toprol XL and has been started on losartan.  Appears mildly volume overloaded, will prescribe lasix 20 mg daily x 3 days on discharge, with plans to recheck BMET and BNP at end of  week.  Suspect tachycardiac-mediated cardiomyopathy, will plan for repeat TTE as outpatient to monitor for improvement in systolic function. Will f/u with Dr Acie Fredrickson for management of her heart failure.  Donato Heinz, MD

## 2019-07-17 ENCOUNTER — Other Ambulatory Visit: Payer: Self-pay

## 2019-07-17 ENCOUNTER — Other Ambulatory Visit: Payer: Medicare Other | Admitting: *Deleted

## 2019-07-17 DIAGNOSIS — I429 Cardiomyopathy, unspecified: Secondary | ICD-10-CM

## 2019-07-18 LAB — BASIC METABOLIC PANEL
BUN/Creatinine Ratio: 21 (ref 12–28)
BUN: 20 mg/dL (ref 10–36)
CO2: 25 mmol/L (ref 20–29)
Calcium: 9.5 mg/dL (ref 8.7–10.3)
Chloride: 106 mmol/L (ref 96–106)
Creatinine, Ser: 0.97 mg/dL (ref 0.57–1.00)
GFR calc Af Amer: 58 mL/min/{1.73_m2} — ABNORMAL LOW (ref >59)
GFR calc non Af Amer: 50 mL/min/{1.73_m2} — ABNORMAL LOW (ref >59)
Glucose: 101 mg/dL — ABNORMAL HIGH (ref 65–99)
Potassium: 4.1 mmol/L (ref 3.5–5.2)
Sodium: 144 mmol/L (ref 134–144)

## 2019-07-18 LAB — BRAIN NATRIURETIC PEPTIDE: BNP: 247.3 pg/mL — ABNORMAL HIGH (ref 0.0–100.0)

## 2019-07-20 ENCOUNTER — Encounter (HOSPITAL_COMMUNITY): Payer: Self-pay | Admitting: Nurse Practitioner

## 2019-07-20 ENCOUNTER — Other Ambulatory Visit: Payer: Self-pay

## 2019-07-20 ENCOUNTER — Ambulatory Visit (HOSPITAL_COMMUNITY)
Admit: 2019-07-20 | Discharge: 2019-07-20 | Disposition: A | Discharge status: Home / Self Care | Payer: Medicare Other | Source: Ambulatory Visit | Attending: Nurse Practitioner | Admitting: Nurse Practitioner

## 2019-07-20 VITALS — BP 170/96 | HR 58 | Ht 65.5 in | Wt 153.6 lb

## 2019-07-20 DIAGNOSIS — Z87891 Personal history of nicotine dependence: Secondary | ICD-10-CM | POA: Diagnosis not present

## 2019-07-20 DIAGNOSIS — I4892 Unspecified atrial flutter: Secondary | ICD-10-CM | POA: Insufficient documentation

## 2019-07-20 DIAGNOSIS — Z79899 Other long term (current) drug therapy: Secondary | ICD-10-CM | POA: Diagnosis not present

## 2019-07-20 DIAGNOSIS — N183 Chronic kidney disease, stage 3 unspecified: Secondary | ICD-10-CM | POA: Insufficient documentation

## 2019-07-20 DIAGNOSIS — I2510 Atherosclerotic heart disease of native coronary artery without angina pectoris: Secondary | ICD-10-CM | POA: Diagnosis not present

## 2019-07-20 DIAGNOSIS — I10 Essential (primary) hypertension: Secondary | ICD-10-CM

## 2019-07-20 DIAGNOSIS — I129 Hypertensive chronic kidney disease with stage 1 through stage 4 chronic kidney disease, or unspecified chronic kidney disease: Secondary | ICD-10-CM | POA: Diagnosis not present

## 2019-07-20 DIAGNOSIS — D6869 Other thrombophilia: Secondary | ICD-10-CM

## 2019-07-20 DIAGNOSIS — M810 Age-related osteoporosis without current pathological fracture: Secondary | ICD-10-CM | POA: Insufficient documentation

## 2019-07-20 DIAGNOSIS — I252 Old myocardial infarction: Secondary | ICD-10-CM | POA: Diagnosis not present

## 2019-07-20 DIAGNOSIS — E785 Hyperlipidemia, unspecified: Secondary | ICD-10-CM | POA: Insufficient documentation

## 2019-07-20 DIAGNOSIS — Z7901 Long term (current) use of anticoagulants: Secondary | ICD-10-CM | POA: Diagnosis not present

## 2019-07-20 DIAGNOSIS — I480 Paroxysmal atrial fibrillation: Secondary | ICD-10-CM | POA: Insufficient documentation

## 2019-07-20 MED ORDER — METOPROLOL SUCCINATE ER 25 MG PO TB24: ORAL_TABLET | ORAL | 3 refills | Status: DC

## 2019-07-20 MED ORDER — LOSARTAN POTASSIUM 50 MG PO TABS: 50.0000 mg | ORAL_TABLET | Freq: Two times a day (BID) | ORAL | 6 refills | Status: DC

## 2019-07-20 NOTE — Progress Notes (Signed)
Date:  07/20/2019   ID:  Shelly Obrien Age, DOB [birth year], MRN [id number]  Location:office  Provider location: [address] [place], [place] [address] Evaluation Performed: Follow up  PCP:  [name], MD  Primary Cardiologist: [name] Primary Electrophysiologist: afib clinic  CC: afib   History of Present Illness: Shelly Obrien is a 86 y.o. female with h/o CAD, s/p MI,  of afib  who presents in office   for irregular heart beat for about one week.  She  called in earlier in week, had increased her metoprolol to bid and felt better. New RX was sent for metoprolol ER bid . In office today, ekg shows atrial flutter with v rate of 107 bpm. She denies any change in her general health. Continues on eliquis 5 mg bid for a CHA2DS2VASc score of at least 4. She  changed from Cardizem to metoprolol last spring for a rash that improved off drug.   F/u in afib clinic, 07/20/19. She went to the ER after I saw her as she lives alone and has not family and was concerned as she was short of breath. She  was admitted and successfully cardioverted. Her BP was elevated and she was started on Losartan 50 mg daily and Toprol was increased to 50 mg bid. Since being home and despite being in [place], she has felt  more fatigued. Echo in the hospital showed decreased  EF at 25-30 %  With regional wall motion abnormalities.  Outpt w/u for ischemia with Dr. Acie Fredrickson was discussed. She was also placed on lasix x 3 days.  Today, she denies symptoms of palpitations, chest pain, shortness of breath, orthopnea, PND, lower extremity edema, claudication, dizziness, presyncope, syncope, bleeding, or neurologic sequela.The patient is tolerating medications without difficulties and is otherwise without complaint today.   she denies symptoms of cough, fevers, chills, or new SOB worrisome for COVID 19.   she has a BMI of Body mass index is 25.17 kg/m.[name]   Past Medical History:  Diagnosis Date  . Aortic ectasia ([place])   . Arm  fracture 2013   right, Dr. Sharen Heck  . B12 deficiency   . CAD S/P percutaneous coronary angioplasty    hx MI 04/ 1994  s/p  PCI to LAD  . CKD (chronic kidney disease), stage III   . Complication of anesthesia    hard to wake   . Gallstones   . History of basal cell carcinoma (BCC) excision    right eye area 08/ 2013  . History of kidney stones 01/20/2017  . History of MI (myocardial infarction) 09/1992   s/p  PCI to LAD  . History of squamous cell carcinoma excision    right lower leg 2015 ;  2016  . Hx of colonic polyps   . Hyperlipidemia   . Hypertension   . Macular degeneration   . Osteoporosis   . PAF (paroxysmal atrial fibrillation) ([place]) dx 02-24-2016   followed by cardiologist-- dr Mamie Nick. [name]  . Pernicious anemia   . PONV (postoperative nausea and vomiting)   . Rib fracture 07/2018   left  . Right ureteral stone   . Wears glasses    Past Surgical History:  Procedure Laterality Date  . ABDOMINAL HYSTERECTOMY    . BREAST EXCISIONAL BIOPSY Left 11/02/2009   fibroadenoma (lumpectomy)  . BROW LIFT Right 04/07/2014   Procedure: REPAIR OF ENTROPION AND TRICHIASIS OF RIGHT LOWER EYE LID ;  Surgeon: Theodoro Kos, DO;  Location:  [facility];  Service: Plastics;  Laterality: Right;  . CARDIOVERSION N/A 07/18/2017   Procedure: CARDIOVERSION;  Surgeon: Skeet Latch, MD;  Location: [facility];  Service: Cardiovascular;  Laterality: N/A;  . CARDIOVERSION N/A 07/13/2019   Procedure: CARDIOVERSION;  Surgeon: Josue Hector, MD;  Location: [facility]OSCOPY;  Service: Cardiovascular;  Laterality: N/A;  . CATARACT EXTRACTION W/ INTRAOCULAR LENS  IMPLANT, BILATERAL    . CHOLECYSTECTOMY N/A 12/06/2017   Procedure: LAPAROSCOPIC CHOLECYSTECTOMY;  Surgeon: Jovita Kussmaul, MD;  Location: [facility];  Service: General;  Laterality: N/A;  . CORONARY ANGIOPLASTY  04/ 1994  dr Lia Foyer   PCI to LAD  . CORONARY ANGIOPLASTY    . CYSTOSCOPY W/ URETERAL STENT PLACEMENT Right 01/20/2017    Procedure: CYSTOSCOPY WITH RIGHT RETROGRADE PYELOGRAM/RIGHT URETERAL STENT PLACEMENT;  Surgeon: Alexis Frock, MD;  Location: [facility];  Service: Urology;  Laterality: Right;  . CYSTOSCOPY WITH RETROGRADE PYELOGRAM, URETEROSCOPY AND STENT PLACEMENT Right 02/15/2017   Procedure: CYSTOSCOPY WITH RETROGRADE PYELOGRAM, URETEROSCOPY, STONE BASKETRY AND STENT REPLACEMENT;  Surgeon: Alexis Frock, MD;  Location: [facility];  Service: Urology;  Laterality: Right;  . CYSTOSCOPY/RETROGRADE/URETEROSCOPY/STONE EXTRACTION WITH BASKET  yrs ago  . DILATION AND CURETTAGE OF UTERUS    . HOLMIUM LASER APPLICATION Right [id number]   Procedure: HOLMIUM LASER APPLICATION;  Surgeon: Alexis Frock, MD;  Location[facility];  Service: Urology;  Laterality: Right;  . KNEE SURGERY     right  . NEUROPLASTY / TRANSPOSITION ULNAR NERVE AT ELBOW Left 02-23-2000    [facility]   and Left Carpal Tunnel Release  . r eyelid cancer    . TONSILLECTOMY    . TRANSTHORACIC ECHOCARDIOGRAM  02/26/2016   moderate focal basal and mild concentric LVH,  ef 55-60%,  akinesis of the basal-midanteroseptal, inferoseptal and apicalinferior myocardium, study not sufficient to evaluation diastolic funciton due to atrial fib./  trivial PR/  mild TR  . UMBILICAL HERNIA REPAIR       Current Outpatient Medications  Medication Sig Dispense Refill  . ELIQUIS 5 MG TABS tablet TAKE 1 TABLET BY MOUTH TWICE DAILY (Patient taking differently: Take 5 mg by mouth 2 (two) times daily. ) 60 tablet 6  . fluocinonide (LIDEX) 0.05 % external solution Apply 1 application topically as needed (itching).     [name] losartan (COZAAR) 50 MG tablet Take 1 tablet (50 mg total) by mouth daily. 30 tablet 6  . metoprolol succinate (TOPROL-XL) 50 MG 24 hr tablet Take 1 tablet (50 mg total) by mouth 2 (two) times daily. Take with or immediately following a meal. 60 tablet 6  . Multiple Vitamins-Minerals (ICAPS) CAPS Take 1 capsule by mouth 2 (two)  times daily.     [name] Phenylephrine HCl (NOSE DROPS NA) Place 1 drop into the nose daily as needed (alleriges).    . triamcinolone (KENALOG) 0.025 % cream Apply 1 application topically 2 (two) times daily.     [name] triamcinolone cream (KENALOG) 0.1 % Apply 1 application topically 2 (two) times daily.     [name] White Petrolatum-Mineral Oil ([place] PETROL-MINERAL OIL-LANOLIN) 0.1-0.1 % OINT Place 1 drop into both eyes daily.     . furosemide (LASIX) 20 MG tablet Take 1 tablet (20 mg total) by mouth daily for 3 days. (Patient not taking: Reported on 07/20/2019) 30 tablet 0   No current facility-administered medications for this encounter.    Allergies:   Antihistamines, chlorpheniramine-type; Penicillins; Contrast media [iodinated diagnostic agents]; Sulfonamide derivatives; Atorvastatin; and Pheniramine  Social History:  The patient  reports that she has quit smoking. Her smoking use included cigarettes. She quit after 5.00 years of use. She has never used smokeless tobacco. She reports that she does not drink alcohol or use drugs.   Family History:  The patient's  family history includes Brain cancer in her son; Cancer in an other family member; Coronary artery disease in an other family member; Liver cancer in her daughter.    ROS:  Please see the history of present illness.   All other systems are personally reviewed and negative.   Exam:GEN- The patient is well appearing, alert and oriented x 3 today.   Head- normocephalic, atraumatic Eyes-  Sclera clear, conjunctiva pink Ears- hearing intact Oropharynx- clear Neck- supple, no JVP Lymph- no cervical lymphadenopathy Lungs- Clear to ausculation bilaterally, normal work of breathing Heart-regular rate and rhythm, no murmurs, rubs or gallops, PMI not laterally displaced GI- soft, NT, ND, + BS Extremities- no clubbing, cyanosis, or edema MS- no significant deformity or atrophy Skin- no rash or lesion Psych- euthymic mood, full affect Neuro- strength  and sensation are intact   Recent Labs: 10/15/2018: ALT 12 07/12/2019: Hemoglobin 12.8; Magnesium 2.0; Platelets 198 07/13/2019: TSH 0.917 07/17/2019: BNP 247.3; BUN 20; Creatinine, Ser 0.97; Potassium 4.1; Sodium 144  EKG- Sinus brady at 58 bpm, pr int 186 ms, qrs int 106 ms, qtc 418 ms      ASSESSMENT AND PLAN:  1. PAF Successfully cardioverted while hospitalized 2/7 thru 2/9 Maintaining SR  Will lower metoprolol er  back to 37.5 mg bid as she is c/o fatigue   2. HTN Elevated  Losartan was added in the hospital  Will increase to 50 mg  Bid   3. CHA2DS2VASc score of 4 Continue  eliquis 5 mg bid     Labs/ tests ordered today include:none Orders Placed This Encounter  Procedures  . EKG 12-Lead  . EKG 12-Lead    Patient Risk:  after full review of this patients clinical status, I feel that they are at high risk at this time.   F/u with Dr. Cathie Olden  in one week 2/22 Will need bmet/bnp at that time per d/c instructions   Signed, Roderic Palau NP 07/20/2019 2:01 PM  Afib [facility] [address] [place], [place] [address] [phone]

## 2019-07-20 NOTE — Patient Instructions (Signed)
Change metoprolol to the 25mg  tablet and take 1 and 1/2 tablets twice a day (to equal 37.5mg  twice a day)  Increase cozaar to 50mg  twice a day  Keep follow up with Dr. Acie Fredrickson on Monday.

## 2019-07-26 NOTE — Progress Notes (Signed)
Cardiology Office Note:    Date:  07/27/2019   ID:  Shelly Obrien, DOB 1922/07/21, MRN [id number]  PCP:  Shelly Orn, MD  Cardiologist:  Shelly Moores, MD  Electrophysiologist:  None   Referring MD: Shelly Orn, MD   No chief complaint on file.    Nov. 18, 2019   [name] is a 86 y.o. female with a hx of coronary artery disease and atrial fibrillation. She is seen for the first time today.  Transfer r from Dr. Harrington Obrien.  Had a cardioversion several months ago - seems to be better . Has been seen in the Afib clinic ,  Is on Eliquis 5  BID  Has had some issues with balance.   Lives at home by herself - townhouse  Hx of CAD in the past Had PTCA - years ago   February 04, 2019 Shelly Obrien is an elderly female with a history of coronary artery disease and atrial fibrillation.  She seems to be doing well. No cp or dyspnea.  Lives independantly .   Feb. 22, 2021 Shelly Obrien is seen today for follow up of her CAD, chronic systolic CHF,  and Afib She was admitted several weeks ago with worsening dyspnea and was found to be in AF. She had a cardioversion. Still has some weakness.   DOE No CP .   She was offered cardiac cath while in the hospital  She has moderate pulmonary artery HTN .    Past Medical History:  Diagnosis Date  . Aortic ectasia ([place])   . Arm fracture 2013   right, Dr. Sharen Heck  . B12 deficiency   . CAD S/P percutaneous coronary angioplasty    hx MI 04/ 1994  s/p  PCI to LAD  . CKD (chronic kidney disease), stage III   . Complication of anesthesia    hard to wake   . Gallstones   . History of basal cell carcinoma (BCC) excision    right eye area 08/ 2013  . History of kidney stones 01/20/2017  . History of MI (myocardial infarction) 09/1992   s/p  PCI to LAD  . History of squamous cell carcinoma excision    right lower leg 2015 ;  2016  . Hx of colonic polyps   . Hyperlipidemia   . Hypertension   . Macular degeneration   . Osteoporosis   . PAF  (paroxysmal atrial fibrillation) ([place]) dx 02-24-2016   followed by cardiologist-- dr Mamie Nick. Shelly Obrien  . Pernicious anemia   . PONV (postoperative nausea and vomiting)   . Rib fracture 07/2018   left  . Right ureteral stone   . Wears glasses     Past Surgical History:  Procedure Laterality Date  . ABDOMINAL HYSTERECTOMY    . BREAST EXCISIONAL BIOPSY Left 11/02/2009   fibroadenoma (lumpectomy)  . BROW LIFT Right 04/07/2014   Procedure: REPAIR OF ENTROPION AND TRICHIASIS OF RIGHT LOWER EYE LID ;  Surgeon: Theodoro Kos, DO;  Location: [facility];  Service: Plastics;  Laterality: Right;  . CARDIOVERSION N/A 07/18/2017   Procedure: CARDIOVERSION;  Surgeon: Skeet Latch, MD;  Location: [facility];  Service: Cardiovascular;  Laterality: N/A;  . CARDIOVERSION N/A 07/13/2019   Procedure: CARDIOVERSION;  Surgeon: Josue Hector, MD;  Location: [facility];  Service: Cardiovascular;  Laterality: N/A;  . CATARACT EXTRACTION W/ INTRAOCULAR LENS  IMPLANT, BILATERAL    . CHOLECYSTECTOMY N/A 12/06/2017   Procedure: LAPAROSCOPIC CHOLECYSTECTOMY;  Surgeon: Jovita Kussmaul, MD;  Location: [facility];  Service: General;  Laterality: N/A;  . CORONARY ANGIOPLASTY  04/ 1994  dr Lia Foyer   PCI to LAD  . CORONARY ANGIOPLASTY    . CYSTOSCOPY W/ URETERAL STENT PLACEMENT Right 01/20/2017   Procedure: CYSTOSCOPY WITH RIGHT RETROGRADE PYELOGRAM/RIGHT URETERAL STENT PLACEMENT;  Surgeon: Alexis Frock, MD;  Location: [facility];  Service: Urology;  Laterality: Right;  . CYSTOSCOPY WITH RETROGRADE PYELOGRAM, URETEROSCOPY AND STENT PLACEMENT Right 02/15/2017   Procedure: CYSTOSCOPY WITH RETROGRADE PYELOGRAM, URETEROSCOPY, STONE BASKETRY AND STENT REPLACEMENT;  Surgeon: Alexis Frock, MD;  Location: [facility];  Service: Urology;  Laterality: Right;  . CYSTOSCOPY/RETROGRADE/URETEROSCOPY/STONE EXTRACTION WITH BASKET  yrs ago  . DILATION AND CURETTAGE OF UTERUS    . HOLMIUM LASER APPLICATION Right  [id number]   Procedure: HOLMIUM LASER APPLICATION;  Surgeon: Alexis Frock, MD;  Location: [facility];  Service: Urology;  Laterality: Right;  . KNEE SURGERY     right  . NEUROPLASTY / TRANSPOSITION ULNAR NERVE AT ELBOW Left 02-23-2000    [facility]   and Left Carpal Tunnel Release  . r eyelid cancer    . TONSILLECTOMY    . TRANSTHORACIC ECHOCARDIOGRAM  02/26/2016   moderate focal basal and mild concentric LVH,  ef 55-60%,  akinesis of the basal-midanteroseptal, inferoseptal and apicalinferior myocardium, study not sufficient to evaluation diastolic funciton due to atrial fib./  trivial PR/  mild TR  . UMBILICAL HERNIA REPAIR      Current Medications: Current Meds  Medication Sig  . ELIQUIS 5 MG TABS tablet TAKE 1 TABLET BY MOUTH TWICE DAILY  . fluocinonide (LIDEX) 0.05 % external solution Apply 1 application topically as needed (itching).   [name] losartan (COZAAR) 50 MG tablet Take 1 tablet (50 mg total) by mouth 2 (two) times daily.  . Multiple Vitamins-Minerals (ICAPS) CAPS Take 1 capsule by mouth 2 (two) times daily.   [name] Phenylephrine HCl (NOSE DROPS NA) Place 1 drop into the nose daily as needed (alleriges).  . triamcinolone (KENALOG) 0.025 % cream Apply 1 application topically 2 (two) times daily.   [name] triamcinolone cream (KENALOG) 0.1 % Apply 1 application topically 2 (two) times daily.   [name] White Petrolatum-Mineral Oil ([place] PETROL-MINERAL OIL-LANOLIN) 0.1-0.1 % OINT Place 1 drop into both eyes daily.   . [DISCONTINUED] metoprolol succinate (TOPROL-XL) 25 MG 24 hr tablet Take 37.5mg  by mouth twice a day     Allergies:   Antihistamines, chlorpheniramine-type; Penicillins; Contrast media [iodinated diagnostic agents]; Sulfonamide derivatives; Atorvastatin; and Pheniramine   Social History   Socioeconomic History  . Marital status: Widowed    Spouse name: Not on file  . Number of children: 2  . Years of education: Not on file  . Highest education level: Not on file   Occupational History  . Occupation: Retired  Tobacco Use  . Smoking status: Former Smoker    Years: 5.00    Types: Cigarettes  . Smokeless tobacco: Never Used  . Tobacco comment: "didn't have the habit that much", just smoked "with the girls"  Substance and Sexual Activity  . Alcohol use: No    Alcohol/week: 0.0 standard drinks  . Drug use: No  . Sexual activity: Not on file  Other Topics Concern  . Not on file  Social History Narrative   Lives at home alone   Right handed   Social Determinants of Health   Financial Resource Strain:   . Difficulty of Paying Living Expenses: Not on file  Food Insecurity:   .  Worried About Charity fundraiser in the Last Year: Not on file  . Ran Out of Food in the Last Year: Not on file  Transportation Needs:   . Lack of Transportation (Medical): Not on file  . Lack of Transportation (Non-Medical): Not on file  Physical Activity:   . Days of Exercise per Week: Not on file  . Minutes of Exercise per Session: Not on file  Stress:   . Feeling of Stress : Not on file  Social Connections:   . Frequency of Communication with Friends and Family: Not on file  . Frequency of Social Gatherings with Friends and Family: Not on file  . Attends Religious Services: Not on file  . Active Member of Clubs or Organizations: Not on file  . Attends Archivist Meetings: Not on file  . Marital Status: Not on file     Family History: The patient's family history includes Brain cancer in her son; Cancer in an other family member; Coronary artery disease in an other family member; Liver cancer in her daughter. There is no history of Neuropathy.  ROS:   Please see the history of present illness.    All other systems reviewed and are negative.  EKGs/Labs/Other Studies Reviewed:      EKG:      Recent Labs: 10/15/2018: ALT 12 07/12/2019: Hemoglobin 12.8; Magnesium 2.0; Platelets 198 07/13/2019: TSH 0.917 07/17/2019: BNP 247.3; BUN 20; Creatinine, Ser  0.97; Potassium 4.1; Sodium 144  Recent Lipid Panel    Component Value Date/Time   CHOL 166 05/07/2018 1041   TRIG 87 05/07/2018 1041   HDL 81 05/07/2018 1041   CHOLHDL 2.0 05/07/2018 1041   LDLCALC 68 05/07/2018 1041    Physical Exam:     Physical Exam: Blood pressure 124/76, pulse 92, height 5' 5.5" (1.664 m), weight 155 lb (70.3 kg), SpO2 99 %.  GEN:  Elderly female,  NAD  HEENT: Right eye drooping  NECK: No JVD; No carotid bruits LYMPHATICS: No lymphadenopathy CARDIAC:  Irreg. Irreg.  RESPIRATORY:  Clear to auscultation without rales, wheezing or rhonchi  ABDOMEN: Soft, non-tender, non-distended MUSCULOSKELETAL:  No edema; No deformity  SKIN: Warm and dry NEUROLOGIC:  Alert and oriented x 3   ASSESSMENT:    No diagnosis found. PLAN:     1.  CAD - no angina    2.  Atrial fib: she is back in atrial fib Will start amio 200 mg po BID.  Increase metoprolol to 50 mg bid She will check her HR daily and will call us if it startes to fall ( ie into the 60s)    3.  Chronic systolic congestive heart failure: She had an echocardiogram which reveals an ejection fraction of 25 to 30%.  Suspect this is rate related.  At 86 years old I do not think she is a good candidate for invasive procedures so we will not pursue ischemic work-up.  She denies having any episodes of chest discomfort.  Will have her see an APP in 4 weeks - I anticipate reducing her amio to 200 mg a day at that time .   Medication Adjustments/Labs and Tests Ordered: Current medicines are reviewed at length with the patient today.  Concerns regarding medicines are outlined above.  No orders of the defined types were placed in this encounter.  Meds ordered this encounter  Medications  . amiodarone (PACERONE) 200 MG tablet    Sig: Take 1 tablet (200 mg total) by mouth 2 (  two) times daily.    Dispense:  60 tablet    Refill:  11  . metoprolol succinate (TOPROL-XL) 50 MG 24 hr tablet    Sig: Take 1 tablet (50  mg total) by mouth 2 (two) times daily. Take with or immediately following a meal.    Dispense:  60 tablet    Refill:  11     Patient Instructions  Medication Instructions:  Your physician has recommended you make the following change in your medication:  START Amiodarone (Pacerone) 200 mg twice daily INCREASE Metoprolol succinate (Toprol XL) to 50 mg twice daily  *If you need a refill on your cardiac medications before your next appointment, please call your pharmacy*   Lab Work: None Ordered If you have labs (blood work) drawn today and your tests are completely normal, you will receive your results only by: [name] MyChart Message (if you have MyChart) OR . A paper copy in the mail If you have any lab test that is abnormal or we need to change your treatment, we will call you to review the results.   Testing/Procedures: None Ordered   Follow-Up: At [facility], you and your health needs are our priority.  As part of our continuing mission to provide you with exceptional heart care, we have created designated Provider Care Teams.  These Care Teams include your primary Cardiologist (physician) and Advanced Practice Providers (APPs -  Physician Assistants and Nurse Practitioners) who all work together to provide you with the care you need, when you need it.  Your next appointment:   1 month(s) on Tuesday March 23 at 3:45 pm  The format for your next appointment:   In Person  Provider:   Richardson Dopp, PA-C      Signed, Shelly Moores, MD  07/27/2019 5:33 PM    [place]

## 2019-07-27 ENCOUNTER — Telehealth: Payer: Self-pay

## 2019-07-27 ENCOUNTER — Ambulatory Visit (INDEPENDENT_AMBULATORY_CARE_PROVIDER_SITE_OTHER): Payer: Medicare Other | Admitting: Cardiovascular Disease

## 2019-07-27 ENCOUNTER — Other Ambulatory Visit: Payer: Self-pay

## 2019-07-27 ENCOUNTER — Encounter: Payer: Self-pay | Admitting: Cardiovascular Disease

## 2019-07-27 VITALS — BP 124/76 | HR 92 | Ht 65.5 in | Wt 155.0 lb

## 2019-07-27 DIAGNOSIS — I480 Paroxysmal atrial fibrillation: Secondary | ICD-10-CM | POA: Diagnosis not present

## 2019-07-27 DIAGNOSIS — I10 Essential (primary) hypertension: Secondary | ICD-10-CM

## 2019-07-27 MED ORDER — AMIODARONE HCL 200 MG PO TABS: 200.0000 mg | ORAL_TABLET | Freq: Two times a day (BID) | ORAL | 11 refills | Status: DC

## 2019-07-27 MED ORDER — METOPROLOL SUCCINATE ER 50 MG PO TB24: 50.0000 mg | ORAL_TABLET | Freq: Two times a day (BID) | ORAL | 11 refills | Status: DC

## 2019-07-27 NOTE — Telephone Encounter (Signed)
Patients niece Zigmund Daniel called back. Zigmund Daniel states patient was told to go back on Metoprolol 50 mg, 1 tablet by mouth twice a day. Zigmund Daniel states that patient has Metoprolol Succinate 50 mg on the prescription patient has at home. Please call Zigmund Daniel at [phone].

## 2019-07-27 NOTE — Patient Instructions (Addendum)
Medication Instructions:  Your physician has recommended you make the following change in your medication:  START Amiodarone (Pacerone) 200 mg twice daily INCREASE Metoprolol succinate (Toprol XL) to 50 mg twice daily  *If you need a refill on your cardiac medications before your next appointment, please call your pharmacy*   Lab Work: None Ordered If you have labs (blood work) drawn today and your tests are completely normal, you will receive your results only by: [name] MyChart Message (if you have MyChart) OR . A paper copy in the mail If you have any lab test that is abnormal or we need to change your treatment, we will call you to review the results.   Testing/Procedures: None Ordered   Follow-Up: At [facility], you and your health needs are our priority.  As part of our continuing mission to provide you with exceptional heart care, we have created designated Provider Care Teams.  These Care Teams include your primary Cardiologist (physician) and Advanced Practice Providers (APPs -  Physician Assistants and Nurse Practitioners) who all work together to provide you with the care you need, when you need it.  Your next appointment:   1 month(s) on Tuesday March 23 at 3:45 pm  The format for your next appointment:   In Person  Provider:   Richardson Dopp, PA-C

## 2019-07-27 NOTE — Telephone Encounter (Signed)
Returned call to [place] to verify the dose of metoprolol succinate. Shelly Obrien states patient has metoprolol succinate 25 mg and 50 mg tablets at home. I advised her to have patient take 50 mg twice daily per Dr. Acie Fredrickson. Shelly Obrien verbalized understanding and agreement and thanked me for the call.

## 2019-07-28 ENCOUNTER — Other Ambulatory Visit (HOSPITAL_COMMUNITY): Payer: Self-pay | Admitting: Nurse Practitioner

## 2019-08-04 ENCOUNTER — Telehealth: Payer: Self-pay | Admitting: Cardiovascular Disease

## 2019-08-04 NOTE — Telephone Encounter (Signed)
Pt c/o medication issue:  1. Name of Medication: ELIQUIS 5 MG TABS tablet  2. How are you currently taking this medication (dosage and times per day)? 2x daily  3. Are you having a reaction (difficulty breathing--STAT)? yes  4. What is your medication issue? Patient states that this medication is causing her to have a runny nose. She reports that she feels like it is affecting her inner ear and there are "chunks of stuff" coming off the roof of her mouth that she has to spit out. She would like to take an alternative medication.

## 2019-08-04 NOTE — Telephone Encounter (Signed)
Left message to discuss

## 2019-08-06 ENCOUNTER — Telehealth (HOSPITAL_COMMUNITY): Payer: Self-pay | Admitting: *Deleted

## 2019-08-06 MED ORDER — AMIODARONE HCL 200 MG PO TABS: 200.0000 mg | ORAL_TABLET | Freq: Every day | ORAL | 6 refills | Status: DC

## 2019-08-06 NOTE — Telephone Encounter (Signed)
duplicate

## 2019-08-06 NOTE — Telephone Encounter (Signed)
Patient states symptoms started once she started Amiodarone. Nose is constantly running, very dizzy and "feels drunk". Her HR and BP are both stable. Discussed with Roderic Palau NP will try decrease amiodarone to 200mg  once a day. Pt does have allergy to contrast dye so if symptoms do not improve pt will let us know.

## 2019-08-13 DIAGNOSIS — H353231 Exudative age-related macular degeneration, bilateral, with active choroidal neovascularization: Secondary | ICD-10-CM | POA: Diagnosis not present

## 2019-08-25 ENCOUNTER — Ambulatory Visit: Payer: Medicare Other | Admitting: Physician Assistant

## 2019-08-30 DIAGNOSIS — R0902 Hypoxemia: Secondary | ICD-10-CM | POA: Diagnosis not present

## 2019-08-30 DIAGNOSIS — I4891 Unspecified atrial fibrillation: Secondary | ICD-10-CM | POA: Diagnosis not present

## 2019-08-30 DIAGNOSIS — R0602 Shortness of breath: Secondary | ICD-10-CM | POA: Diagnosis not present

## 2019-08-30 DIAGNOSIS — I447 Left bundle-branch block, unspecified: Secondary | ICD-10-CM | POA: Diagnosis not present

## 2019-08-30 DIAGNOSIS — I10 Essential (primary) hypertension: Secondary | ICD-10-CM | POA: Diagnosis not present

## 2019-08-31 ENCOUNTER — Ambulatory Visit (HOSPITAL_COMMUNITY)
Admission: RE | Admit: 2019-08-31 | Discharge: 2019-08-31 | Disposition: A | Discharge status: Home / Self Care | Payer: Medicare Other | Source: Ambulatory Visit | Attending: Nurse Practitioner | Admitting: Nurse Practitioner

## 2019-08-31 ENCOUNTER — Other Ambulatory Visit: Payer: Self-pay

## 2019-08-31 ENCOUNTER — Encounter (HOSPITAL_COMMUNITY): Payer: Self-pay | Admitting: Nurse Practitioner

## 2019-08-31 VITALS — BP 146/110 | HR 95 | Ht 65.5 in | Wt 158.6 lb

## 2019-08-31 DIAGNOSIS — I2510 Atherosclerotic heart disease of native coronary artery without angina pectoris: Secondary | ICD-10-CM | POA: Insufficient documentation

## 2019-08-31 DIAGNOSIS — I4819 Other persistent atrial fibrillation: Secondary | ICD-10-CM

## 2019-08-31 DIAGNOSIS — Z79899 Other long term (current) drug therapy: Secondary | ICD-10-CM | POA: Insufficient documentation

## 2019-08-31 DIAGNOSIS — I129 Hypertensive chronic kidney disease with stage 1 through stage 4 chronic kidney disease, or unspecified chronic kidney disease: Secondary | ICD-10-CM | POA: Insufficient documentation

## 2019-08-31 DIAGNOSIS — E785 Hyperlipidemia, unspecified: Secondary | ICD-10-CM | POA: Diagnosis not present

## 2019-08-31 DIAGNOSIS — Z7901 Long term (current) use of anticoagulants: Secondary | ICD-10-CM | POA: Insufficient documentation

## 2019-08-31 DIAGNOSIS — I252 Old myocardial infarction: Secondary | ICD-10-CM | POA: Insufficient documentation

## 2019-08-31 DIAGNOSIS — D6869 Other thrombophilia: Secondary | ICD-10-CM

## 2019-08-31 DIAGNOSIS — Z87891 Personal history of nicotine dependence: Secondary | ICD-10-CM | POA: Diagnosis not present

## 2019-08-31 DIAGNOSIS — N183 Chronic kidney disease, stage 3 unspecified: Secondary | ICD-10-CM | POA: Diagnosis not present

## 2019-08-31 DIAGNOSIS — I480 Paroxysmal atrial fibrillation: Secondary | ICD-10-CM | POA: Insufficient documentation

## 2019-08-31 LAB — CBC
HCT: 42.9 % (ref 36.0–46.0)
Hemoglobin: 12.8 g/dL (ref 12.0–15.0)
MCH: 29.4 pg (ref 26.0–34.0)
MCHC: 29.8 g/dL — ABNORMAL LOW (ref 30.0–36.0)
MCV: 98.6 fL (ref 80.0–100.0)
Platelets: 234 10*3/uL (ref 150–400)
RBC: 4.35 MIL/uL (ref 3.87–5.11)
RDW: 15.4 % (ref 11.5–15.5)
WBC: 7 10*3/uL (ref 4.0–10.5)
nRBC: 0 % (ref 0.0–0.2)

## 2019-08-31 LAB — COMPREHENSIVE METABOLIC PANEL
ALT: 24 U/L (ref 0–44)
AST: 22 U/L (ref 15–41)
Albumin: 3.6 g/dL (ref 3.5–5.0)
Alkaline Phosphatase: 81 U/L (ref 38–126)
Anion gap: 7 (ref 5–15)
BUN: 20 mg/dL (ref 8–23)
CO2: 26 mmol/L (ref 22–32)
Calcium: 9.5 mg/dL (ref 8.9–10.3)
Chloride: 107 mmol/L (ref 98–111)
Creatinine, Ser: 1.48 mg/dL — ABNORMAL HIGH (ref 0.44–1.00)
GFR calc Af Amer: 35 mL/min — ABNORMAL LOW (ref >60)
GFR calc non Af Amer: 30 mL/min — ABNORMAL LOW (ref >60)
Glucose, Bld: 110 mg/dL — ABNORMAL HIGH (ref 70–99)
Potassium: 6.1 mmol/L — ABNORMAL HIGH (ref 3.5–5.1)
Sodium: 140 mmol/L (ref 135–145)
Total Bilirubin: 1.2 mg/dL (ref 0.3–1.2)
Total Protein: 6.4 g/dL — ABNORMAL LOW (ref 6.5–8.1)

## 2019-08-31 LAB — TSH: TSH: 2.102 u[IU]/mL (ref 0.350–4.500)

## 2019-08-31 MED ORDER — FUROSEMIDE 20 MG PO TABS: ORAL_TABLET | ORAL | 0 refills | Status: DC

## 2019-08-31 NOTE — H&P (View-Only) (Signed)
Date:  08/31/2019   ID:  Shelly Obrien Age, DOB 1922-11-15, MRN [id number]  Location:office  Provider location: [address] [place], [place] [address] Evaluation Performed: Follow up  PCP:  [name], MD  Primary Cardiologist: [name] Primary Electrophysiologist: afib clinic  CC: afib   History of Present Illness: Shelly Obrien is a 86 y.o. female with h/o CAD, s/p MI,  of afib  who presents in office   for irregular heart beat for about one week.  She  called in earlier in week, had increased her metoprolol to bid and felt better. New RX was sent for metoprolol ER bid . In office today, ekg shows atrial flutter with v rate of 107 bpm. She denies any change in her general health. Continues on eliquis 5 mg bid for a CHA2DS2VASc score of at least 4. She  changed from Cardizem to metoprolol last spring for a rash that improved off drug.   F/u in afib clinic, 07/20/19. She went to the ER after I saw her as she lives alone and has not family and was concerned as she was short of breath. She  was admitted and successfully cardioverted. Her BP was elevated and she was started on Losartan 50 mg daily and Toprol was increased to 50 mg bid. Since being home and despite being in [place], she has felt  more fatigued. Echo in the hospital showed decreased  EF at 25-30 %  With regional wall motion abnormalities.  Outpt w/u for ischemia with Dr. Acie Fredrickson was discussed. She was also placed on lasix x 3 days.  F/u in afib clinic, 08/31/19.She saw Dr. Acie Fredrickson 07/27/19 and was started on amiodarone 200 mg bid as she was out of rhythm. She  called the office 3/4 and was c/o dizziness, being drunk. Amiodarone was decreased to 200 mg daily. She  is being seen today as she called in this am not feeling well. EKG shows persistent afib, rate controlled. She has continued c/o's of being weak and shortness of breath. She  has had to sit sleeping upright a couple of nights. Her weight is up 4 lbs.   Today, she denies symptoms of  palpitations, chest pain, shortness of breath, orthopnea, PND, lower extremity edema, claudication, dizziness, presyncope, syncope, bleeding, or neurologic sequela.The patient is tolerating medications without difficulties and is otherwise without complaint today.   she denies symptoms of cough, fevers, chills, or new SOB worrisome for COVID 19.   she has a BMI of There is no height or weight on file to calculate BMI.[name]   Past Medical History:  Diagnosis Date  . Aortic ectasia ([place])   . Arm fracture 2013   right, Dr. Sharen Heck  . B12 deficiency   . CAD S/P percutaneous coronary angioplasty    hx MI 04/ 1994  s/p  PCI to LAD  . CKD (chronic kidney disease), stage III   . Complication of anesthesia    hard to wake   . Gallstones   . History of basal cell carcinoma (BCC) excision    right eye area 08/ 2013  . History of kidney stones 01/20/2017  . History of MI (myocardial infarction) 09/1992   s/p  PCI to LAD  . History of squamous cell carcinoma excision    right lower leg 2015 ;  2016  . Hx of colonic polyps   . Hyperlipidemia   . Hypertension   . Macular degeneration   . Osteoporosis   . PAF (  paroxysmal atrial fibrillation) ([place]) dx 02-24-2016   followed by cardiologist-- dr Mamie Nick. [name]  . Pernicious anemia   . PONV (postoperative nausea and vomiting)   . Rib fracture 07/2018   left  . Right ureteral stone   . Wears glasses    Past Surgical History:  Procedure Laterality Date  . ABDOMINAL HYSTERECTOMY    . BREAST EXCISIONAL BIOPSY Left 11/02/2009   fibroadenoma (lumpectomy)  . BROW LIFT Right 04/07/2014   Procedure: REPAIR OF ENTROPION AND TRICHIASIS OF RIGHT LOWER EYE LID ;  Surgeon: Theodoro Kos, DO;  Location: [place];  Service: Plastics;  Laterality: Right;  . CARDIOVERSION N/A 07/18/2017   Procedure: CARDIOVERSION;  Surgeon: Skeet Latch, MD;  Location: [place];  Service: Cardiovascular;  Laterality: N/A;  . CARDIOVERSION N/A 07/13/2019    Procedure: CARDIOVERSION;  Surgeon: Josue Hector, MD;  Location[facility] ENDOSCOPY;  Service: Cardiovascular;  Laterality: N/A;  . CATARACT EXTRACTION W/ INTRAOCULAR LENS  IMPLANT, BILATERAL    . CHOLECYSTECTOMY N/A 12/06/2017   Procedure: LAPAROSCOPIC CHOLECYSTECTOMY;  Surgeon: Jovita Kussmaul, MD;  Location: [facility];  Service: General;  Laterality: N/A;  . CORONARY ANGIOPLASTY  04/ 1994  dr Lia Foyer   PCI to LAD  . CORONARY ANGIOPLASTY    . CYSTOSCOPY W/ URETERAL STENT PLACEMENT Right 01/20/2017   Procedure: CYSTOSCOPY WITH RIGHT RETROGRADE PYELOGRAM/RIGHT URETERAL STENT PLACEMENT;  Surgeon: Alexis Frock, MD;  [facility];  Service: Urology;  Laterality: Right;  . CYSTOSCOPY WITH RETROGRADE PYELOGRAM, URETEROSCOPY AND STENT PLACEMENT Right 02/15/2017   Procedure: CYSTOSCOPY WITH RETROGRADE PYELOGRAM, URETEROSCOPY, STONE BASKETRY AND STENT REPLACEMENT;  Surgeon: Alexis Frock, MD;  Location: [place];  Service: Urology;  Laterality: Right;  . CYSTOSCOPY/RETROGRADE/URETEROSCOPY/STONE EXTRACTION WITH BASKET  yrs ago  . DILATION AND CURETTAGE OF UTERUS    . HOLMIUM LASER APPLICATION Right [id number]   Procedure: HOLMIUM LASER APPLICATION;  Surgeon: Alexis Frock, MD;  Location: [facility];  Service: Urology;  Laterality: Right;  . KNEE SURGERY     right  . NEUROPLASTY / TRANSPOSITION ULNAR NERVE AT ELBOW Left 02-23-2000    [facility]   and Left Carpal Tunnel Release  . r eyelid cancer    . TONSILLECTOMY    . TRANSTHORACIC ECHOCARDIOGRAM  02/26/2016   moderate focal basal and mild concentric LVH,  ef 55-60%,  akinesis of the basal-midanteroseptal, inferoseptal and apicalinferior myocardium, study not sufficient to evaluation diastolic funciton due to atrial fib./  trivial PR/  mild TR  . UMBILICAL HERNIA REPAIR       Current Outpatient Medications  Medication Sig Dispense Refill  . amiodarone (PACERONE) 200 MG tablet Take 1 tablet (200 mg total) by mouth daily.  60 tablet 6  . ELIQUIS 5 MG TABS tablet TAKE 1 TABLET BY MOUTH TWICE DAILY 60 tablet 6  . fluocinonide (LIDEX) 0.05 % external solution Apply 1 application topically as needed (itching).     [name] losartan (COZAAR) 50 MG tablet Take 1 tablet (50 mg total) by mouth 2 (two) times daily. 60 tablet 6  . metoprolol succinate (TOPROL-XL) 50 MG 24 hr tablet Take 1 tablet (50 mg total) by mouth 2 (two) times daily. Take with or immediately following a meal. 60 tablet 11  . Multiple Vitamins-Minerals (ICAPS) CAPS Take 1 capsule by mouth 2 (two) times daily.     [name] Phenylephrine HCl (NOSE DROPS NA) Place 1 drop into the nose daily as needed (alleriges).    . triamcinolone (KENALOG) 0.025 %  cream Apply 1 application topically 2 (two) times daily.     [name] triamcinolone cream (KENALOG) 0.1 % Apply 1 application topically 2 (two) times daily.     [name] White Petrolatum-Mineral Oil ([place] PETROL-MINERAL OIL-LANOLIN) 0.1-0.1 % OINT Place 1 drop into both eyes daily.      No current facility-administered medications for this encounter.    Allergies:   Antihistamines, chlorpheniramine-type; Penicillins; Contrast media [iodinated diagnostic agents]; Sulfonamide derivatives; Atorvastatin; and Pheniramine   Social History:  The patient  reports that she has quit smoking. Her smoking use included cigarettes. She quit after 5.00 years of use. She has never used smokeless tobacco. She reports that she does not drink alcohol or use drugs.   Family History:  The patient's  family history includes Brain cancer in her son; Cancer in an other family member; Coronary artery disease in an other family member; Liver cancer in her daughter.    ROS:  Please see the history of present illness.   All other systems are personally reviewed and negative.   Exam:GEN- The patient is well appearing, alert and oriented x 3 today.   Head- normocephalic, atraumatic Eyes-  Sclera clear, conjunctiva pink Ears- hearing intact Oropharynx- clear Neck-  supple, no JVP Lymph- no cervical lymphadenopathy Lungs- Clear to ausculation bilaterally, normal work of breathing Heart-irregular rate and rhythm, no murmurs, rubs or gallops, PMI not laterally displaced GI- soft, NT, ND, + BS Extremities- no clubbing, cyanosis, or edema MS- no significant deformity or atrophy Skin- no rash or lesion Psych- euthymic mood, full affect Neuro- strength and sensation are intact   Recent Labs: 10/15/2018: ALT 12 07/12/2019: Hemoglobin 12.8; Magnesium 2.0; Platelets 198 07/13/2019: TSH 0.917 07/17/2019: BNP 247.3; BUN 20; Creatinine, Ser 0.97; Potassium 4.1; Sodium 144  EKG- Sinus brady at 58 bpm, pr int 186 ms, qrs int 106 ms, qtc 418 ms      ASSESSMENT AND PLAN:  1. PAF Successfully cardioverted while hospitalized 2/7 thru 2/9 Unfortunately was back in afib 2/22 with Dr. Acie Fredrickson and was started on amiodarone 200 mg bid  She was on this dose for about one week and did not tolerate, has been on 200 mg daily since then Will plan on cardioversion April 1. Will lower metoprolol er  back to 25 mg bid day of cardioversion  I will add lasix 20 mg for 3 days prior to cardioversion for increased weight (4 lbs) and some orthopnea ( no obvious fluid overload on exam) Cbc/cmet/tsh/covid today  2. HTN Elevated  Losartan was added in the hospital  Hopefully lasix will lower  3. CHA2DS2VASc score of 4 Continue  eliquis 5 mg bid  States no missed doses x 3 weeks     Labs/ tests ordered today include:none Orders Placed This Encounter  Procedures  . EKG 12-Lead    Patient Risk:  after full review of this patients clinical status, I feel that they are at high risk at this time.   F/u here   one week after DCCV  Signed, Roderic Palau NP 08/31/2019 2:19 PM  Afib [facility] [address] [place], [place] [address] [phone]

## 2019-08-31 NOTE — Patient Instructions (Signed)
Cardioversion scheduled for Thursday, April 1st  - Arrive at the [facility] and go to admitting at 8:30AM  -Do not eat or drink anything after midnight the night prior to your procedure.  - Take all your morning medication with a sip of water prior to arrival.  - You will not be able to drive home after your procedure.   On Thursday you will reduce your metoprolol to 1/2 tablet twice a day  Lasix 20mg  daily for the next 3 days.

## 2019-08-31 NOTE — Progress Notes (Signed)
Date:  08/31/2019   ID:  [name], DOB [birth year], MRN [id number]  Location:office  Provider location: [address] [place], [place] [address] Evaluation Performed: Follow up  PCP:  [name], MD  Primary Cardiologist: [name] Primary Electrophysiologist: afib clinic  CC: afib   History of Present Illness: Shelly Obrien is a 86 y.o. female with h/o CAD, s/p MI,  of afib  who presents in office   for irregular heart beat for about one week.  She  called in earlier in week, had increased her metoprolol to bid and felt better. New RX was sent for metoprolol ER bid . In office today, ekg shows atrial flutter with v rate of 107 bpm. She denies any change in her general health. Continues on eliquis 5 mg bid for a CHA2DS2VASc score of at least 4. She  changed from Cardizem to metoprolol last spring for a rash that improved off drug.   F/u in afib clinic, 07/20/19. She went to the ER after I saw her as she lives alone and has not family and was concerned as she was short of breath. She  was admitted and successfully cardioverted. Her BP was elevated and she was started on Losartan 50 mg daily and Toprol was increased to 50 mg bid. Since being home and despite being in [place], she has felt  more fatigued. Echo in the hospital showed decreased  EF at 25-30 %  With regional wall motion abnormalities.  Outpt w/u for ischemia with Dr. Acie Fredrickson was discussed. She was also placed on lasix x 3 days.  F/u in afib clinic, 08/31/19.She saw Dr. Acie Fredrickson 07/27/19 and was started on amiodarone 200 mg bid as she was out of rhythm. She  called the office 3/4 and was c/o dizziness, being drunk. Amiodarone was decreased to 200 mg daily. She  is being seen today as she called in this am not feeling well. EKG shows persistent afib, rate controlled. She has continued c/o's of being weak and shortness of breath. She  has had to sit sleeping upright a couple of nights. Her weight is up 4 lbs.   Today, she denies symptoms of  palpitations, chest pain, shortness of breath, orthopnea, PND, lower extremity edema, claudication, dizziness, presyncope, syncope, bleeding, or neurologic sequela.The patient is tolerating medications without difficulties and is otherwise without complaint today.   she denies symptoms of cough, fevers, chills, or new SOB worrisome for COVID 19.   she has a BMI of There is no height or weight on file to calculate BMI.[name]   Past Medical History:  Diagnosis Date  . Aortic ectasia ([place])   . Arm fracture 2013   right, Dr. Sharen Heck  . B12 deficiency   . CAD S/P percutaneous coronary angioplasty    hx MI 04/ 1994  s/p  PCI to LAD  . CKD (chronic kidney disease), stage III   . Complication of anesthesia    hard to wake   . Gallstones   . History of basal cell carcinoma (BCC) excision    right eye area 08/ 2013  . History of kidney stones 01/20/2017  . History of MI (myocardial infarction) 09/1992   s/p  PCI to LAD  . History of squamous cell carcinoma excision    right lower leg 2015 ;  2016  . Hx of colonic polyps   . Hyperlipidemia   . Hypertension   . Macular degeneration   . Osteoporosis   . PAF (  paroxysmal atrial fibrillation) ([place]) dx 02-24-2016   followed by cardiologist-- dr Mamie Nick. [name]  . Pernicious anemia   . PONV (postoperative nausea and vomiting)   . Rib fracture 07/2018   left  . Right ureteral stone   . Wears glasses    Past Surgical History:  Procedure Laterality Date  . ABDOMINAL HYSTERECTOMY    . BREAST EXCISIONAL BIOPSY Left 11/02/2009   fibroadenoma (lumpectomy)  . BROW LIFT Right 04/07/2014   Procedure: REPAIR OF ENTROPION AND TRICHIASIS OF RIGHT LOWER EYE LID ;  Surgeon: Theodoro Kos, DO;  Location: [place];  Service: Plastics;  Laterality: Right;  . CARDIOVERSION N/A 07/18/2017   Procedure: CARDIOVERSION;  Surgeon: Skeet Latch, MD;  Location: [place];  Service: Cardiovascular;  Laterality: N/A;  . CARDIOVERSION N/A 07/13/2019    Procedure: CARDIOVERSION;  Surgeon: Josue Hector, MD;  Location: [facility] ENDOSCOPY;  Service: Cardiovascular;  Laterality: N/A;  . CATARACT EXTRACTION W/ INTRAOCULAR LENS  IMPLANT, BILATERAL    . CHOLECYSTECTOMY N/A 12/06/2017   Procedure: LAPAROSCOPIC CHOLECYSTECTOMY;  Surgeon: Jovita Kussmaul, MD;  [facility];  Service: General;  Laterality: N/A;  . CORONARY ANGIOPLASTY  04/ 1994  dr Lia Foyer   PCI to LAD  . CORONARY ANGIOPLASTY    . CYSTOSCOPY W/ URETERAL STENT PLACEMENT Right 01/20/2017   Procedure: CYSTOSCOPY WITH RIGHT RETROGRADE PYELOGRAM/RIGHT URETERAL STENT PLACEMENT;  Surgeon: Alexis Frock, MD;  [facility];  Service: Urology;  Laterality: Right;  . CYSTOSCOPY WITH RETROGRADE PYELOGRAM, URETEROSCOPY AND STENT PLACEMENT Right 02/15/2017   Procedure: CYSTOSCOPY WITH RETROGRADE PYELOGRAM, URETEROSCOPY, STONE BASKETRY AND STENT REPLACEMENT;  Surgeon: Alexis Frock, MD;  [facility];  Service: Urology;  Laterality: Right;  . CYSTOSCOPY/RETROGRADE/URETEROSCOPY/STONE EXTRACTION WITH BASKET  yrs ago  . DILATION AND CURETTAGE OF UTERUS    . HOLMIUM LASER APPLICATION Right [id number]   Procedure: HOLMIUM LASER APPLICATION;  Surgeon: Alexis Frock, MD;  Location: [facility];  Service: Urology;  Laterality: Right;  . KNEE SURGERY     right  . NEUROPLASTY / TRANSPOSITION ULNAR NERVE AT ELBOW Left 02-23-2000    [facility]   and Left Carpal Tunnel Release  . r eyelid cancer    . TONSILLECTOMY    . TRANSTHORACIC ECHOCARDIOGRAM  02/26/2016   moderate focal basal and mild concentric LVH,  ef 55-60%,  akinesis of the basal-midanteroseptal, inferoseptal and apicalinferior myocardium, study not sufficient to evaluation diastolic funciton due to atrial fib./  trivial PR/  mild TR  . UMBILICAL HERNIA REPAIR       Current Outpatient Medications  Medication Sig Dispense Refill  . amiodarone (PACERONE) 200 MG tablet Take 1 tablet (200 mg total) by mouth daily.  60 tablet 6  . ELIQUIS 5 MG TABS tablet TAKE 1 TABLET BY MOUTH TWICE DAILY 60 tablet 6  . fluocinonide (LIDEX) 0.05 % external solution Apply 1 application topically as needed (itching).     [name] losartan (COZAAR) 50 MG tablet Take 1 tablet (50 mg total) by mouth 2 (two) times daily. 60 tablet 6  . metoprolol succinate (TOPROL-XL) 50 MG 24 hr tablet Take 1 tablet (50 mg total) by mouth 2 (two) times daily. Take with or immediately following a meal. 60 tablet 11  . Multiple Vitamins-Minerals (ICAPS) CAPS Take 1 capsule by mouth 2 (two) times daily.     [name] Phenylephrine HCl (NOSE DROPS NA) Place 1 drop into the nose daily as needed (alleriges).    . triamcinolone (KENALOG) 0.025 %  cream Apply 1 application topically 2 (two) times daily.     [name] triamcinolone cream (KENALOG) 0.1 % Apply 1 application topically 2 (two) times daily.     [name] White Petrolatum-Mineral Oil ([place] PETROL-MINERAL OIL-LANOLIN) 0.1-0.1 % OINT Place 1 drop into both eyes daily.      No current facility-administered medications for this encounter.    Allergies:   Antihistamines, chlorpheniramine-type; Penicillins; Contrast media [iodinated diagnostic agents]; Sulfonamide derivatives; Atorvastatin; and Pheniramine   Social History:  The patient  reports that she has quit smoking. Her smoking use included cigarettes. She quit after 5.00 years of use. She has never used smokeless tobacco. She reports that she does not drink alcohol or use drugs.   Family History:  The patient's  family history includes Brain cancer in her son; Cancer in an other family member; Coronary artery disease in an other family member; Liver cancer in her daughter.    ROS:  Please see the history of present illness.   All other systems are personally reviewed and negative.   Exam:GEN- The patient is well appearing, alert and oriented x 3 today.   Head- normocephalic, atraumatic Eyes-  Sclera clear, conjunctiva pink Ears- hearing intact Oropharynx- clear Neck-  supple, no JVP Lymph- no cervical lymphadenopathy Lungs- Clear to ausculation bilaterally, normal work of breathing Heart-irregular rate and rhythm, no murmurs, rubs or gallops, PMI not laterally displaced GI- soft, NT, ND, + BS Extremities- no clubbing, cyanosis, or edema MS- no significant deformity or atrophy Skin- no rash or lesion Psych- euthymic mood, full affect Neuro- strength and sensation are intact   Recent Labs: 10/15/2018: ALT 12 07/12/2019: Hemoglobin 12.8; Magnesium 2.0; Platelets 198 07/13/2019: TSH 0.917 07/17/2019: BNP 247.3; BUN 20; Creatinine, Ser 0.97; Potassium 4.1; Sodium 144  EKG- Sinus brady at 58 bpm, pr int 186 ms, qrs int 106 ms, qtc 418 ms      ASSESSMENT AND PLAN:  1. PAF Successfully cardioverted while hospitalized 2/7 thru 2/9 Unfortunately was back in afib 2/22 with Dr. Acie Fredrickson and was started on amiodarone 200 mg bid  She was on this dose for about one week and did not tolerate, has been on 200 mg daily since then Will plan on cardioversion April 1. Will lower metoprolol er  back to 25 mg bid day of cardioversion  I will add lasix 20 mg for 3 days prior to cardioversion for increased weight (4 lbs) and some orthopnea ( no obvious fluid overload on exam) Cbc/cmet/tsh/covid today  2. HTN Elevated  Losartan was added in the hospital  Hopefully lasix will lower  3. CHA2DS2VASc score of 4 Continue  eliquis 5 mg bid  States no missed doses x 3 weeks     Labs/ tests ordered today include:none Orders Placed This Encounter  Procedures  . EKG 12-Lead    Patient Risk:  after full review of this patients clinical status, I feel that they are at high risk at this time.   F/u here   one week after DCCV  Signed, Roderic Palau NP 08/31/2019 2:19 PM  [facility] [address] [place], [place] [address] [phone]

## 2019-09-01 ENCOUNTER — Other Ambulatory Visit (HOSPITAL_COMMUNITY): Payer: Self-pay | Admitting: *Deleted

## 2019-09-01 ENCOUNTER — Other Ambulatory Visit (HOSPITAL_COMMUNITY)
Admission: RE | Admit: 2019-09-01 | Discharge: 2019-09-01 | Disposition: A | Discharge status: Home / Self Care | Payer: Medicare Other | Source: Ambulatory Visit | Attending: Cardiology | Admitting: Cardiology

## 2019-09-01 DIAGNOSIS — Z01812 Encounter for preprocedural laboratory examination: Secondary | ICD-10-CM | POA: Diagnosis not present

## 2019-09-01 DIAGNOSIS — Z20822 Contact with and (suspected) exposure to covid-19: Secondary | ICD-10-CM | POA: Diagnosis not present

## 2019-09-01 LAB — SARS CORONAVIRUS 2 (TAT 6-24 HRS): SARS Coronavirus 2: NEGATIVE

## 2019-09-03 ENCOUNTER — Other Ambulatory Visit: Payer: Self-pay

## 2019-09-03 ENCOUNTER — Encounter (HOSPITAL_COMMUNITY)
Admission: RE | Disposition: A | Discharge status: Home / Self Care | Payer: Self-pay | Source: Home / Self Care | Attending: Cardiology

## 2019-09-03 ENCOUNTER — Encounter (HOSPITAL_COMMUNITY): Payer: Self-pay | Admitting: Cardiology

## 2019-09-03 ENCOUNTER — Ambulatory Visit (HOSPITAL_COMMUNITY): Payer: Medicare Other | Admitting: Certified Registered Nurse Anesthetist

## 2019-09-03 ENCOUNTER — Ambulatory Visit (HOSPITAL_COMMUNITY)
Admission: RE | Admit: 2019-09-03 | Discharge: 2019-09-03 | Disposition: A | Discharge status: Home / Self Care | Payer: Medicare Other | Attending: Cardiology | Admitting: Cardiology

## 2019-09-03 DIAGNOSIS — M1990 Unspecified osteoarthritis, unspecified site: Secondary | ICD-10-CM | POA: Diagnosis not present

## 2019-09-03 DIAGNOSIS — E785 Hyperlipidemia, unspecified: Secondary | ICD-10-CM | POA: Diagnosis not present

## 2019-09-03 DIAGNOSIS — I4811 Longstanding persistent atrial fibrillation: Secondary | ICD-10-CM | POA: Diagnosis not present

## 2019-09-03 DIAGNOSIS — I2510 Atherosclerotic heart disease of native coronary artery without angina pectoris: Secondary | ICD-10-CM | POA: Insufficient documentation

## 2019-09-03 DIAGNOSIS — Z955 Presence of coronary angioplasty implant and graft: Secondary | ICD-10-CM | POA: Insufficient documentation

## 2019-09-03 DIAGNOSIS — Z79899 Other long term (current) drug therapy: Secondary | ICD-10-CM | POA: Diagnosis not present

## 2019-09-03 DIAGNOSIS — Z87891 Personal history of nicotine dependence: Secondary | ICD-10-CM | POA: Diagnosis not present

## 2019-09-03 DIAGNOSIS — Z7901 Long term (current) use of anticoagulants: Secondary | ICD-10-CM | POA: Diagnosis not present

## 2019-09-03 DIAGNOSIS — Z882 Allergy status to sulfonamides status: Secondary | ICD-10-CM | POA: Insufficient documentation

## 2019-09-03 DIAGNOSIS — I480 Paroxysmal atrial fibrillation: Secondary | ICD-10-CM | POA: Insufficient documentation

## 2019-09-03 DIAGNOSIS — Z888 Allergy status to other drugs, medicaments and biological substances status: Secondary | ICD-10-CM | POA: Insufficient documentation

## 2019-09-03 DIAGNOSIS — N183 Chronic kidney disease, stage 3 unspecified: Secondary | ICD-10-CM | POA: Diagnosis not present

## 2019-09-03 DIAGNOSIS — I252 Old myocardial infarction: Secondary | ICD-10-CM | POA: Diagnosis not present

## 2019-09-03 DIAGNOSIS — Z8249 Family history of ischemic heart disease and other diseases of the circulatory system: Secondary | ICD-10-CM | POA: Insufficient documentation

## 2019-09-03 DIAGNOSIS — I129 Hypertensive chronic kidney disease with stage 1 through stage 4 chronic kidney disease, or unspecified chronic kidney disease: Secondary | ICD-10-CM | POA: Diagnosis not present

## 2019-09-03 DIAGNOSIS — I130 Hypertensive heart and chronic kidney disease with heart failure and stage 1 through stage 4 chronic kidney disease, or unspecified chronic kidney disease: Secondary | ICD-10-CM | POA: Diagnosis not present

## 2019-09-03 DIAGNOSIS — Z91041 Radiographic dye allergy status: Secondary | ICD-10-CM | POA: Insufficient documentation

## 2019-09-03 DIAGNOSIS — I5020 Unspecified systolic (congestive) heart failure: Secondary | ICD-10-CM | POA: Diagnosis not present

## 2019-09-03 HISTORY — PX: CARDIOVERSION: SHX1299

## 2019-09-03 LAB — POCT I-STAT, CHEM 8
BUN: 25 mg/dL — ABNORMAL HIGH (ref 8–23)
Calcium, Ion: 1.24 mmol/L (ref 1.15–1.40)
Chloride: 105 mmol/L (ref 98–111)
Creatinine, Ser: 1.4 mg/dL — ABNORMAL HIGH (ref 0.44–1.00)
Glucose, Bld: 119 mg/dL — ABNORMAL HIGH (ref 70–99)
HCT: 39 % (ref 36.0–46.0)
Hemoglobin: 13.3 g/dL (ref 12.0–15.0)
Potassium: 4.6 mmol/L (ref 3.5–5.1)
Sodium: 142 mmol/L (ref 135–145)
TCO2: 28 mmol/L (ref 22–32)

## 2019-09-03 SURGERY — CARDIOVERSION
Anesthesia: General

## 2019-09-03 MED ORDER — SODIUM CHLORIDE 0.9 % IV SOLN: INTRAVENOUS | Status: DC | PRN

## 2019-09-03 MED ORDER — PROPOFOL 10 MG/ML IV BOLUS
INTRAVENOUS | Status: DC | PRN
  Administered 2019-09-03: 50 mg via INTRAVENOUS

## 2019-09-03 MED ORDER — LIDOCAINE 2% (20 MG/ML) 5 ML SYRINGE
INTRAMUSCULAR | Status: DC | PRN
  Administered 2019-09-03: 40 mg via INTRAVENOUS

## 2019-09-03 NOTE — Anesthesia Postprocedure Evaluation (Signed)
Anesthesia Post Note  Patient: Shelly Obrien  Procedure(s) Performed: CARDIOVERSION (N/A )     Patient location during evaluation: Endoscopy Anesthesia Type: General Level of consciousness: awake and alert Pain management: pain level controlled Vital Signs Assessment: post-procedure vital signs reviewed and stable Respiratory status: spontaneous breathing, nonlabored ventilation and respiratory function stable Cardiovascular status: blood pressure returned to baseline and stable Postop Assessment: no apparent nausea or vomiting Anesthetic complications: no    Last Vitals:  Vitals:   09/03/19 1000 09/03/19 1005  BP: 136/81 (!) 156/86  Pulse: (!) 45 (!) 42  Resp: 11 12  Temp:    SpO2: 100% 100%    Last Pain:  Vitals:   09/03/19 1005  TempSrc:   PainSc: 0-No pain                 Lidia Collum

## 2019-09-03 NOTE — Transfer of Care (Signed)
Immediate Anesthesia Transfer of Care Note  Patient: Shelly Obrien  Procedure(s) Performed: CARDIOVERSION (N/A )  Patient Location: Endoscopy Unit  Anesthesia Type:General  Level of Consciousness: drowsy, patient cooperative and responds to stimulation  Airway & Oxygen Therapy: Patient Spontanous Breathing and Patient connected to nasal cannula oxygen  Post-op Assessment: Report given to RN and Post -op Vital signs reviewed and stable  Post vital signs: Reviewed and stable  Last Vitals:  Vitals Value Taken Time  BP    Temp    Pulse    Resp    SpO2      Last Pain:  Vitals:   09/03/19 0855  TempSrc: Oral  PainSc: 0-No pain         Complications: No apparent anesthesia complications

## 2019-09-03 NOTE — Interval H&P Note (Signed)
History and Physical Interval Note:  09/03/2019 9:08 AM  Shelly Obrien  has presented today for surgery, with the diagnosis of AFIB.  The various methods of treatment have been discussed with the patient and family. After consideration of risks, benefits and other options for treatment, the patient has consented to  Procedure(s): CARDIOVERSION (N/A) as a surgical intervention.  The patient's history has been reviewed, patient examined, no change in status, stable for surgery.  I have reviewed the patient's chart and labs.  Questions were answered to the patient's satisfaction.     Shelly Obrien

## 2019-09-03 NOTE — Anesthesia Preprocedure Evaluation (Signed)
Anesthesia Evaluation  Patient identified by MRN, date of birth, ID band Patient awake    Reviewed: Allergy & Precautions, NPO status , Patient's Chart, lab work & pertinent test results  History of Anesthesia Complications (+) PONVNegative for: history of anesthetic complications  Airway Mallampati: II  TM Distance: >3 FB Neck ROM: Full    Dental   Pulmonary neg pulmonary ROS, former smoker,    Pulmonary exam normal        Cardiovascular hypertension, + CAD, + Past MI (1994), + Cardiac Stents and +CHF  + dysrhythmias Atrial Fibrillation  Rhythm:Irregular     Neuro/Psych negative neurological ROS  negative psych ROS   GI/Hepatic negative GI ROS, Neg liver ROS,   Endo/Other  negative endocrine ROS  Renal/GU Renal InsufficiencyRenal disease (CKDIII)  negative genitourinary   Musculoskeletal negative musculoskeletal ROS (+)   Abdominal   Peds  Hematology negative hematology ROS (+)   Anesthesia Other Findings  Echo 07/13/19: EF 25-30%, PASP 60, mild MR, AV sclerosis w/o stenosis  Reproductive/Obstetrics                             Anesthesia Physical Anesthesia Plan  ASA: IV  Anesthesia Plan: General   Post-op Pain Management:    Induction: Intravenous  PONV Risk Score and Plan: TIVA and Treatment may vary due to age or medical condition  Airway Management Planned: Mask  Additional Equipment: None  Intra-op Plan:   Post-operative Plan:   Informed Consent: I have reviewed the patients History and Physical, chart, labs and discussed the procedure including the risks, benefits and alternatives for the proposed anesthesia with the patient or authorized representative who has indicated his/her understanding and acceptance.       Plan Discussed with:   Anesthesia Plan Comments:         Anesthesia Quick Evaluation

## 2019-09-03 NOTE — CV Procedure (Signed)
Procedure:   DCCV  Indication:  Symptomatic atrial fibrillation  Procedure Note:  The patient signed informed consent.  They have had had therapeutic anticoagulation with apixaban greater than 3 weeks.  Anesthesia was administered by Dr. Christella Hartigan. She received 50 mg propofol and 40 mg lidocaine.  Adequate airway was maintained throughout and vital followed per protocol.  They were cardioverted x 1 with 120J of biphasic synchronized energy.  They converted to NSR.  There were no apparent complications.  The patient had normal neuro status and respiratory status post procedure with vitals stable as recorded elsewhere.    Follow up:  They will continue on current medical therapy and follow up with cardiology as scheduled.  Buford Dresser, MD PhD 09/03/2019 9:46 AM

## 2019-09-07 ENCOUNTER — Telehealth (HOSPITAL_COMMUNITY): Payer: Self-pay | Admitting: *Deleted

## 2019-09-07 NOTE — Telephone Encounter (Signed)
Patient son called in stating he is concerned regarding all the BP medication she is on -- she stopped her losartan over the weekend and has felt better so she is going to continue holding until follow up. Pt has not weighed this morning but is having increased shortness of breath - he will give her 10mg  of lasix if her weight is up. Has follow up Thursday.

## 2019-09-10 ENCOUNTER — Encounter (HOSPITAL_COMMUNITY): Payer: Self-pay

## 2019-09-10 ENCOUNTER — Telehealth: Payer: Self-pay | Admitting: Cardiovascular Disease

## 2019-09-10 ENCOUNTER — Inpatient Hospital Stay (HOSPITAL_COMMUNITY): Admission: RE | Admit: 2019-09-10 | Payer: Medicare Other | Source: Ambulatory Visit | Admitting: Nurse Practitioner

## 2019-09-10 NOTE — Telephone Encounter (Signed)
Patient calling to request her son Shelly Obrien come with her, because she states she has trouble walking.

## 2019-09-10 NOTE — Telephone Encounter (Signed)
Pt has appt today with the [facility] and then on 4/12 with Dr. Acie Fredrickson as I am not quite sure which appt pt may be talking about or she may be asking for both appts to have her son come.

## 2019-09-10 NOTE — Telephone Encounter (Signed)
Placed note in chart for appointment on 4/12 with Dr. Acie Fredrickson

## 2019-09-13 NOTE — Progress Notes (Signed)
Cardiology Office Note:    Date:  09/14/2019   ID:  Shelly Obrien, DOB [birth year], MRN [id number]  PCP:  [name], MD  Cardiologist:  Mertie Moores, MD  Electrophysiologist:  None   Referring MD: [name], MD   No chief complaint on file.    Nov. 18, 2019   [place] is a 86 y.o. female with a hx of coronary artery disease and atrial fibrillation. She is seen for the first time today.  Transfer r from Dr. [name].  Had a cardioversion several months ago - seems to be better . Has been seen in the Afib clinic ,  Is on Eliquis 5  BID  Has had some issues with balance.   Lives at home by herself - townhouse  Hx of CAD in the past Had PTCA - years ago   February 04, 2019 Shelly Obrien is an elderly female with a history of coronary artery disease and atrial fibrillation.  She seems to be doing well. No cp or dyspnea.  Lives independantly .   Feb. 22, 2021 Shelly Obrien is seen today for follow up of her CAD, chronic systolic CHF,  and Afib She was admitted several weeks ago with worsening dyspnea and was found to be in AF. She had a cardioversion. Still has some weakness.   DOE No CP .   She was offered cardiac cath while in the hospital  She has moderate pulmonary artery HTN . 09-14-2019: Shelly Obrien seen back for follow-up visit. She is 86 year old female with a history of coronary artery disease, atrial fibrillation.   She was seen in the AF clinic .  She had a cardioversion recently. She has been on amiodarone. She has chronic systolic congestive heart failure with an ejection fraction of 25 to 30%. She does not think she is on amiodarone any longer.  Does not know   She stopped the Losartan on her own - make her feel bad    She is seen back today for follow up  She is back in Afib.  Has weakness and DOE with walking  Has no energy.   Has severe LV dysfunction with EF 25-30%.  Has moderate - severe pulmonary HTN  Still eating salt and salty foods.     Past  Medical History:  Diagnosis Date  . Aortic ectasia ([place])   . Arm fracture 2013   right, Dr. Sharen Heck  . B12 deficiency   . CAD S/P percutaneous coronary angioplasty    hx MI 04/ 1994  s/p  PCI to LAD  . CKD (chronic kidney disease), stage III   . Complication of anesthesia    hard to wake   . Gallstones   . History of basal cell carcinoma (BCC) excision    right eye area 08/ 2013  . History of kidney stones 01/20/2017  . History of MI (myocardial infarction) 09/1992   s/p  PCI to LAD  . History of squamous cell carcinoma excision    right lower leg 2015 ;  2016  . Hx of colonic polyps   . Hyperlipidemia   . Hypertension   . Macular degeneration   . Osteoporosis   . PAF (paroxysmal atrial fibrillation) ([place]) dx 02-24-2016   followed by cardiologist-- dr Mamie Nick. [name]  . Pernicious anemia   . PONV (postoperative nausea and vomiting)   . Rib fracture 07/2018   left  . Right ureteral stone   . Wears glasses     Past Surgical  History:  Procedure Laterality Date  . ABDOMINAL HYSTERECTOMY    . BREAST EXCISIONAL BIOPSY Left 11/02/2009   fibroadenoma (lumpectomy)  . BROW LIFT Right 04/07/2014   Procedure: REPAIR OF ENTROPION AND TRICHIASIS OF RIGHT LOWER EYE LID ;  Surgeon: Theodoro Kos, DO;  Location: [facility];  Service: Plastics;  Laterality: Right;  . CARDIOVERSION N/A 07/18/2017   Procedure: CARDIOVERSION;  Surgeon: Skeet Latch, MD;  Location: [facility];  Service: Cardiovascular;  Laterality: N/A;  . CARDIOVERSION N/A 07/13/2019   Procedure: CARDIOVERSION;  Surgeon: Josue Hector, MD;  Location: [facility] ENDOSCOPY;  Service: Cardiovascular;  Laterality: N/A;  . CARDIOVERSION N/A 09/03/2019   Procedure: CARDIOVERSION;  Surgeon: Buford Dresser, MD;  Location: [facility] ENDOSCOPY;  Service: Cardiovascular;  Laterality: N/A;  . CATARACT EXTRACTION W/ INTRAOCULAR LENS  IMPLANT, BILATERAL    . CHOLECYSTECTOMY N/A 12/06/2017   Procedure: LAPAROSCOPIC CHOLECYSTECTOMY;   Surgeon: Jovita Kussmaul, MD;  Location: [facility];  Service: General;  Laterality: N/A;  . CORONARY ANGIOPLASTY  04/ 1994  dr Lia Foyer   PCI to LAD  . CORONARY ANGIOPLASTY    . CYSTOSCOPY W/ URETERAL STENT PLACEMENT Right 01/20/2017   Procedure: CYSTOSCOPY WITH RIGHT RETROGRADE PYELOGRAM/RIGHT URETERAL STENT PLACEMENT;  Surgeon: Alexis Frock, MD;  Location: [facility];  Service: Urology;  Laterality: Right;  . CYSTOSCOPY WITH RETROGRADE PYELOGRAM, URETEROSCOPY AND STENT PLACEMENT Right 02/15/2017   Procedure: CYSTOSCOPY WITH RETROGRADE PYELOGRAM, URETEROSCOPY, STONE BASKETRY AND STENT REPLACEMENT;  Surgeon: Alexis Frock, MD;  Location: [facility];  Service: Urology;  Laterality: Right;  . CYSTOSCOPY/RETROGRADE/URETEROSCOPY/STONE EXTRACTION WITH BASKET  yrs ago  . DILATION AND CURETTAGE OF UTERUS    . HOLMIUM LASER APPLICATION Right [id number]   Procedure: HOLMIUM LASER APPLICATION;  Surgeon: Alexis Frock, MD;  Location: [facility];  Service: Urology;  Laterality: Right;  . KNEE SURGERY     right  . NEUROPLASTY / TRANSPOSITION ULNAR NERVE AT ELBOW Left 02-23-2000    [facility]   and Left Carpal Tunnel Release  . r eyelid cancer    . TONSILLECTOMY    . TRANSTHORACIC ECHOCARDIOGRAM  02/26/2016   moderate focal basal and mild concentric LVH,  ef 55-60%,  akinesis of the basal-midanteroseptal, inferoseptal and apicalinferior myocardium, study not sufficient to evaluation diastolic funciton due to atrial fib./  trivial PR/  mild TR  . UMBILICAL HERNIA REPAIR      Current Medications: Current Meds  Medication Sig  . DHA-EPA-Flaxseed Oil-Vitamin E (THERA TEARS NUTRITION PO) Take 1 drop by mouth daily as needed (watery eyes).  [name] ELIQUIS 5 MG TABS tablet TAKE 1 TABLET BY MOUTH TWICE DAILY (Patient taking differently: Take 5 mg by mouth 2 (two) times daily. )  . fluocinonide (LIDEX) 0.05 % external solution Apply 1 application topically as needed (itching).   . furosemide  (LASIX) 20 MG tablet Take 1 tablet by mouth daily for the next 3 days then only as needed for swelling  . Multiple Vitamins-Minerals (ICAPS) CAPS Take 1 capsule by mouth 2 (two) times daily.   [name] Phenylephrine HCl (NOSE DROPS NA) Place 1 drop into the nose daily as needed (alleriges).  . triamcinolone (KENALOG) 0.025 % cream Apply 1 application topically 2 (two) times daily.   [name] triamcinolone cream (KENALOG) 0.1 % Apply 1 application topically 2 (two) times daily.   . [DISCONTINUED] amiodarone (PACERONE) 200 MG tablet Take 1 tablet (200 mg total) by mouth daily. (Patient taking differently: Take 200 mg by mouth 2 (two)  times daily. )  . [DISCONTINUED] amiodarone (PACERONE) 200 MG tablet Take 200 mg by mouth daily.  . [DISCONTINUED] metoprolol succinate (TOPROL-XL) 50 MG 24 hr tablet Take 1 tablet (50 mg total) by mouth 2 (two) times daily. Take with or immediately following a meal.  . [DISCONTINUED] metoprolol tartrate (LOPRESSOR) 50 MG tablet Take 50 mg by mouth daily.     Allergies:   Antihistamines, chlorpheniramine-type; Penicillins; Contrast media [iodinated diagnostic agents]; Sulfonamide derivatives; Atorvastatin; and Pheniramine   Social History   Socioeconomic History  . Marital status: Widowed    Spouse name: Not on file  . Number of children: 2  . Years of education: Not on file  . Highest education level: Not on file  Occupational History  . Occupation: Retired  Tobacco Use  . Smoking status: Former Smoker    Years: 5.00    Types: Cigarettes  . Smokeless tobacco: Never Used  . Tobacco comment: "didn't have the habit that much", just smoked "with the girls"  Substance and Sexual Activity  . Alcohol use: No    Alcohol/week: 0.0 standard drinks  . Drug use: No  . Sexual activity: Not on file  Other Topics Concern  . Not on file  Social History Narrative   Lives at home alone   Right handed   Social Determinants of Health   Financial Resource Strain:   . Difficulty  of Paying Living Expenses:   Food Insecurity:   . Worried About Charity fundraiser in the Last Year:   . Arboriculturist in the Last Year:   Transportation Needs:   . Film/video editor (Medical):   [name] Lack of Transportation (Non-Medical):   Physical Activity:   . Days of Exercise per Week:   . Minutes of Exercise per Session:   Stress:   . Feeling of Stress :   Social Connections:   . Frequency of Communication with Friends and Family:   . Frequency of Social Gatherings with Friends and Family:   . Attends Religious Services:   . Active Member of Clubs or Organizations:   . Attends Archivist Meetings:   [name] Marital Status:      Family History: The patient's family history includes Brain cancer in her son; Cancer in an other family member; Coronary artery disease in an other family member; Liver cancer in her daughter. There is no history of Neuropathy.  ROS:   Please see the history of present illness.    All other systems reviewed and are negative.  EKGs/Labs/Other Studies Reviewed:      EKG:      Recent Labs: 07/12/2019: Magnesium 2.0 07/17/2019: BNP 247.3 08/31/2019: ALT 24; Platelets 234; TSH 2.102 09/03/2019: BUN 25; Creatinine, Ser 1.40; Hemoglobin 13.3; Potassium 4.6; Sodium 142  Recent Lipid Panel    Component Value Date/Time   CHOL 166 05/07/2018 1041   TRIG 87 05/07/2018 1041   HDL 81 05/07/2018 1041   CHOLHDL 2.0 05/07/2018 1041   LDLCALC 68 05/07/2018 1041    Physical Exam:     Physical Exam: Blood pressure 138/88, pulse (!) 102, height 5' 5.5" (1.664 m), weight 154 lb (69.9 kg), SpO2 97 %.  GEN:   Elderly female,  Generally weak  HEENT: Normal NECK: No JVD; No carotid bruits LYMPHATICS: No lymphadenopathy CARDIAC:  Irregl. irreg  RESPIRATORY:  Clear to auscultation without rales, wheezing or rhonchi  ABDOMEN: Soft, non-tender, non-distended MUSCULOSKELETAL:  No edema; No deformity  SKIN: Warm and dry  NEUROLOGIC:  Alert and oriented x  3    ASSESSMENT:    1. Persistent atrial fibrillation ([place])   2. Essential hypertension   3. Coronary artery disease involving native coronary artery of native heart without angina pectoris   4. Cardiomyopathy, unspecified type ([place])    PLAN:     1.  CAD -   no episodes of angina.  2.  Atrial fib: She is been on amiodarone but apparently did not tolerate it.  She does not think she has it in her med list.  We will discontinue the amiodarone because I do not think she is on it. For now we will concentrate on a rate control anticoagulation strategy.  We will increase the metoprolol to 50 mg twice a day.  I am not entirely sure whether she has metoprolol tartrate or metoprolol succinate but will increase her dose.  I will see her back in the office in 3 weeks and will reconcile her medicines with her new med list.  Of asked her to make sure that she brings all her medications to that next visit so that we can tell what she is actually taking.  3.  Chronic systolic congestive heart failure:  .  She has an ejection fraction of 25 to 30%.  Talking with her she eats lots of salty foods including lots of processed meats.  I encouraged her to stay away from all processed meats.  We will give her a DASH diet.   Medication Adjustments/Labs and Tests Ordered: Current medicines are reviewed at length with the patient today.  Concerns regarding medicines are outlined above.  Orders Placed This Encounter  Procedures  . EKG 12-Lead   Meds ordered this encounter  Medications  . metoprolol tartrate (LOPRESSOR) 50 MG tablet    Sig: Take 1 tablet (50 mg total) by mouth 2 (two) times daily.    Dispense:  180 tablet    Refill:  3     Patient Instructions  Medication Instructions:  Your physician has recommended you make the following change in your medication:  STOP Amiodarone (Pacerone) INCREASE Metoprolol to 50 mg twice daily  **bring pill bottles to next appointment *If you need a refill on  your cardiac medications before your next appointment, please call your pharmacy*   Lab Work: None Ordered If you have labs (blood work) drawn today and your tests are completely normal, you will receive your results only by: [name] MyChart Message (if you have MyChart) OR . A paper copy in the mail If you have any lab test that is abnormal or we need to change your treatment, we will call you to review the results.   Testing/Procedures: None Ordered   Follow-Up: At [facility], you and your health needs are our priority.  As part of our continuing mission to provide you with exceptional heart care, we have created designated Provider Care Teams.  These Care Teams include your primary Cardiologist (physician) and Advanced Practice Providers (APPs -  Physician Assistants and Nurse Practitioners) who all work together to provide you with the care you need, when you need it.  We recommend signing up for the patient portal called "MyChart".  Sign up information is provided on this After Visit Summary.  MyChart is used to connect with patients for Virtual Visits (Telemedicine).  Patients are able to view lab/test results, encounter notes, upcoming appointments, etc.  Non-urgent messages can be sent to your provider as well.   To learn more about what  you can do with MyChart, go to [email or web address].    Your next appointment:   3 week(s) on Friday May 7 at 4:00 pm  The format for your next appointment:   In Person  Provider:   Mertie Moores, MD   Other Instructions  DASH Eating Plan DASH stands for "Dietary Approaches to Stop Hypertension." The DASH eating plan is a healthy eating plan that has been shown to reduce high blood pressure (hypertension). It may also reduce your risk for type 2 diabetes, heart disease, and stroke. The DASH eating plan may also help with weight loss. What are tips for following this plan?  General guidelines  Avoid eating more than 2,300 mg  (milligrams) of salt (sodium) a day. If you have hypertension, you may need to reduce your sodium intake to 1,500 mg a day.  Limit alcohol intake to no more than 1 drink a day for nonpregnant women and 2 drinks a day for men. One drink equals 12 oz of beer, 5 oz of wine, or 1 oz of hard liquor.  Work with your health care provider to maintain a healthy body weight or to lose weight. Ask what an ideal weight is for you.  Get at least 30 minutes of exercise that causes your heart to beat faster (aerobic exercise) most days of the week. Activities may include walking, swimming, or biking.  Work with your health care provider or diet and nutrition specialist (dietitian) to adjust your eating plan to your individual calorie needs. Reading food labels   Check food labels for the amount of sodium per serving. Choose foods with less than 5 percent of the Daily Value of sodium. Generally, foods with less than 300 mg of sodium per serving fit into this eating plan.  To find whole grains, look for the word "whole" as the first word in the ingredient list. Shopping  Buy products labeled as "low-sodium" or "no salt added."  Buy fresh foods. Avoid canned foods and premade or frozen meals. Cooking  Avoid adding salt when cooking. Use salt-free seasonings or herbs instead of table salt or sea salt. Check with your health care provider or pharmacist before using salt substitutes.  Do not fry foods. Cook foods using healthy methods such as baking, boiling, grilling, and broiling instead.  Cook with heart-healthy oils, such as olive, canola, soybean, or sunflower oil. Meal planning  Eat a balanced diet that includes: ? 5 or more servings of fruits and vegetables each day. At each meal, try to fill half of your plate with fruits and vegetables. ? Up to 6-8 servings of whole grains each day. ? Less than 6 oz of lean meat, poultry, or fish each day. A 3-oz serving of meat is about the same size as a deck  of cards. One egg equals 1 oz. ? 2 servings of low-fat dairy each day. ? A serving of nuts, seeds, or beans 5 times each week. ? Heart-healthy fats. Healthy fats called Omega-3 fatty acids are found in foods such as flaxseeds and coldwater fish, like sardines, salmon, and mackerel.  Limit how much you eat of the following: ? Canned or prepackaged foods. ? Food that is high in trans fat, such as fried foods. ? Food that is high in saturated fat, such as fatty meat. ? Sweets, desserts, sugary drinks, and other foods with added sugar. ? Full-fat dairy products.  Do not salt foods before eating.  Try to eat at least 2 vegetarian meals each week.  Eat more home-cooked food and less restaurant, buffet, and fast food.  When eating at a restaurant, ask that your food be prepared with less salt or no salt, if possible. What foods are recommended? The items listed may not be a complete list. Talk with your dietitian about what dietary choices are best for you. Grains Whole-grain or whole-wheat bread. Whole-grain or whole-wheat pasta. Brown rice. [place]. Bulgur. Whole-grain and low-sodium cereals. Pita bread. Low-fat, low-sodium crackers. Whole-wheat flour tortillas. Vegetables Fresh or frozen vegetables (raw, steamed, roasted, or grilled). Low-sodium or reduced-sodium tomato and vegetable juice. Low-sodium or reduced-sodium tomato sauce and tomato paste. Low-sodium or reduced-sodium canned vegetables. Fruits All fresh, dried, or frozen fruit. Canned fruit in natural juice (without added sugar). Meat and other protein foods Skinless chicken or [place]. Ground chicken or [place]. Pork with fat trimmed off. Fish and seafood. Egg whites. Dried beans, peas, or lentils. Unsalted nuts, nut butters, and seeds. Unsalted canned beans. Lean cuts of beef with fat trimmed off. Low-sodium, lean deli meat. Dairy Low-fat (1%) or fat-free (skim) milk. Fat-free, low-fat, or reduced-fat cheeses. Nonfat,  low-sodium ricotta or cottage cheese. Low-fat or nonfat yogurt. Low-fat, low-sodium cheese. Fats and oils Soft margarine without trans fats. Vegetable oil. Low-fat, reduced-fat, or light mayonnaise and salad dressings (reduced-sodium). Canola, safflower, olive, soybean, and sunflower oils. Avocado. Seasoning and other foods Herbs. Spices. Seasoning mixes without salt. Unsalted popcorn and pretzels. Fat-free sweets. What foods are not recommended? The items listed may not be a complete list. Talk with your dietitian about what dietary choices are best for you. Grains Baked goods made with fat, such as croissants, muffins, or some breads. Dry pasta or rice meal packs. Vegetables Creamed or fried vegetables. Vegetables in a cheese sauce. Regular canned vegetables (not low-sodium or reduced-sodium). Regular canned tomato sauce and paste (not low-sodium or reduced-sodium). Regular tomato and vegetable juice (not low-sodium or reduced-sodium). Angie Fava. Olives. Fruits Canned fruit in a light or heavy syrup. Fried fruit. Fruit in cream or butter sauce. Meat and other protein foods Fatty cuts of meat. Ribs. Fried meat. Berniece Salines. Sausage. Bologna and other processed lunch meats. Salami. Fatback. Hotdogs. Bratwurst. Salted nuts and seeds. Canned beans with added salt. Canned or smoked fish. Whole eggs or egg yolks. Chicken or [place] with skin. Dairy Whole or 2% milk, cream, and half-and-half. Whole or full-fat cream cheese. Whole-fat or sweetened yogurt. Full-fat cheese. Nondairy creamers. Whipped toppings. Processed cheese and cheese spreads. Fats and oils Butter. Stick margarine. Lard. Shortening. Ghee. Bacon fat. Tropical oils, such as coconut, palm kernel, or palm oil. Seasoning and other foods Salted popcorn and pretzels. Onion salt, garlic salt, seasoned salt, table salt, and sea salt. Worcestershire sauce. Tartar sauce. Barbecue sauce. Teriyaki sauce. Soy sauce, including reduced-sodium. Steak sauce.  Canned and packaged gravies. Fish sauce. Oyster sauce. Cocktail sauce. Horseradish that you find on the shelf. Ketchup. Mustard. Meat flavorings and tenderizers. Bouillon cubes. Hot sauce and Tabasco sauce. Premade or packaged marinades. Premade or packaged taco seasonings. Relishes. Regular salad dressings. Where to find more information:  National Heart, Lung, and [place]: https://wilson-eaton.com/  American Heart Association: www.heart.org Summary  The DASH eating plan is a healthy eating plan that has been shown to reduce high blood pressure (hypertension). It may also reduce your risk for type 2 diabetes, heart disease, and stroke.  With the DASH eating plan, you should limit salt (sodium) intake to 2,300 mg a day. If you have hypertension, you may need to reduce your sodium intake  to 1,500 mg a day.  When on the DASH eating plan, aim to eat more fresh fruits and vegetables, whole grains, lean proteins, low-fat dairy, and heart-healthy fats.  Work with your health care provider or diet and nutrition specialist (dietitian) to adjust your eating plan to your individual calorie needs. This information is not intended to replace advice given to you by your health care provider. Make sure you discuss any questions you have with your health care provider. Document Revised: 05/03/2017 Document Reviewed: 05/14/2016 Elsevier Patient Education  2020 [place], Mertie Moores, MD  09/14/2019 5:53 PM    [place]

## 2019-09-14 ENCOUNTER — Ambulatory Visit (INDEPENDENT_AMBULATORY_CARE_PROVIDER_SITE_OTHER): Payer: Medicare Other | Admitting: Cardiovascular Disease

## 2019-09-14 ENCOUNTER — Encounter: Payer: Self-pay | Admitting: Cardiovascular Disease

## 2019-09-14 ENCOUNTER — Other Ambulatory Visit: Payer: Self-pay

## 2019-09-14 VITALS — BP 138/88 | HR 102 | Ht 65.5 in | Wt 154.0 lb

## 2019-09-14 DIAGNOSIS — I10 Essential (primary) hypertension: Secondary | ICD-10-CM

## 2019-09-14 DIAGNOSIS — I2510 Atherosclerotic heart disease of native coronary artery without angina pectoris: Secondary | ICD-10-CM

## 2019-09-14 DIAGNOSIS — I429 Cardiomyopathy, unspecified: Secondary | ICD-10-CM

## 2019-09-14 DIAGNOSIS — I4811 Longstanding persistent atrial fibrillation: Secondary | ICD-10-CM

## 2019-09-14 DIAGNOSIS — I4819 Other persistent atrial fibrillation: Secondary | ICD-10-CM

## 2019-09-14 MED ORDER — METOPROLOL TARTRATE 50 MG PO TABS: 50.0000 mg | ORAL_TABLET | Freq: Two times a day (BID) | ORAL | 3 refills | Status: DC

## 2019-09-14 NOTE — Patient Instructions (Addendum)
Medication Instructions:  Your physician has recommended you make the following change in your medication:  STOP Amiodarone (Pacerone) INCREASE Metoprolol to 50 mg twice daily  **bring pill bottles to next appointment *If you need a refill on your cardiac medications before your next appointment, please call your pharmacy*   Lab Work: None Ordered If you have labs (blood work) drawn today and your tests are completely normal, you will receive your results only by: [name] MyChart Message (if you have MyChart) OR . A paper copy in the mail If you have any lab test that is abnormal or we need to change your treatment, we will call you to review the results.   Testing/Procedures: None Ordered   Follow-Up: At [facility], you and your health needs are our priority.  As part of our continuing mission to provide you with exceptional heart care, we have created designated Provider Care Teams.  These Care Teams include your primary Cardiologist (physician) and Advanced Practice Providers (APPs -  Physician Assistants and Nurse Practitioners) who all work together to provide you with the care you need, when you need it.  We recommend signing up for the patient portal called "MyChart".  Sign up information is provided on this After Visit Summary.  MyChart is used to connect with patients for Virtual Visits (Telemedicine).  Patients are able to view lab/test results, encounter notes, upcoming appointments, etc.  Non-urgent messages can be sent to your provider as well.   To learn more about what you can do with MyChart, go to [email or web address].    Your next appointment:   3 week(s) on Friday May 7 at 4:00 pm  The format for your next appointment:   In Person  Provider:   Mertie Moores, MD   Other Instructions  DASH Eating Plan DASH stands for "Dietary Approaches to Stop Hypertension." The DASH eating plan is a healthy eating plan that has been shown to reduce high blood pressure  (hypertension). It may also reduce your risk for type 2 diabetes, heart disease, and stroke. The DASH eating plan may also help with weight loss. What are tips for following this plan?  General guidelines  Avoid eating more than 2,300 mg (milligrams) of salt (sodium) a day. If you have hypertension, you may need to reduce your sodium intake to 1,500 mg a day.  Limit alcohol intake to no more than 1 drink a day for nonpregnant women and 2 drinks a day for men. One drink equals 12 oz of beer, 5 oz of wine, or 1 oz of hard liquor.  Work with your health care provider to maintain a healthy body weight or to lose weight. Ask what an ideal weight is for you.  Get at least 30 minutes of exercise that causes your heart to beat faster (aerobic exercise) most days of the week. Activities may include walking, swimming, or biking.  Work with your health care provider or diet and nutrition specialist (dietitian) to adjust your eating plan to your individual calorie needs. Reading food labels   Check food labels for the amount of sodium per serving. Choose foods with less than 5 percent of the Daily Value of sodium. Generally, foods with less than 300 mg of sodium per serving fit into this eating plan.  To find whole grains, look for the word "whole" as the first word in the ingredient list. Shopping  Buy products labeled as "low-sodium" or "no salt added."  Buy fresh foods. Avoid canned foods and premade or  frozen meals. Cooking  Avoid adding salt when cooking. Use salt-free seasonings or herbs instead of table salt or sea salt. Check with your health care provider or pharmacist before using salt substitutes.  Do not fry foods. Cook foods using healthy methods such as baking, boiling, grilling, and broiling instead.  Cook with heart-healthy oils, such as olive, canola, soybean, or sunflower oil. Meal planning  Eat a balanced diet that includes: ? 5 or more servings of fruits and vegetables  each day. At each meal, try to fill half of your plate with fruits and vegetables. ? Up to 6-8 servings of whole grains each day. ? Less than 6 oz of lean meat, poultry, or fish each day. A 3-oz serving of meat is about the same size as a deck of cards. One egg equals 1 oz. ? 2 servings of low-fat dairy each day. ? A serving of nuts, seeds, or beans 5 times each week. ? Heart-healthy fats. Healthy fats called Omega-3 fatty acids are found in foods such as flaxseeds and coldwater fish, like sardines, salmon, and mackerel.  Limit how much you eat of the following: ? Canned or prepackaged foods. ? Food that is high in trans fat, such as fried foods. ? Food that is high in saturated fat, such as fatty meat. ? Sweets, desserts, sugary drinks, and other foods with added sugar. ? Full-fat dairy products.  Do not salt foods before eating.  Try to eat at least 2 vegetarian meals each week.  Eat more home-cooked food and less restaurant, buffet, and fast food.  When eating at a restaurant, ask that your food be prepared with less salt or no salt, if possible. What foods are recommended? The items listed may not be a complete list. Talk with your dietitian about what dietary choices are best for you. Grains Whole-grain or whole-wheat bread. Whole-grain or whole-wheat pasta. Brown rice. [place] Morrow. Bulgur. Whole-grain and low-sodium cereals. Pita bread. Low-fat, low-sodium crackers. Whole-wheat flour tortillas. Vegetables Fresh or frozen vegetables (raw, steamed, roasted, or grilled). Low-sodium or reduced-sodium tomato and vegetable juice. Low-sodium or reduced-sodium tomato sauce and tomato paste. Low-sodium or reduced-sodium canned vegetables. Fruits All fresh, dried, or frozen fruit. Canned fruit in natural juice (without added sugar). Meat and other protein foods Skinless chicken or [place]. Ground chicken or [place]. Pork with fat trimmed off. Fish and seafood. Egg whites. Dried beans,  peas, or lentils. Unsalted nuts, nut butters, and seeds. Unsalted canned beans. Lean cuts of beef with fat trimmed off. Low-sodium, lean deli meat. Dairy Low-fat (1%) or fat-free (skim) milk. Fat-free, low-fat, or reduced-fat cheeses. Nonfat, low-sodium ricotta or cottage cheese. Low-fat or nonfat yogurt. Low-fat, low-sodium cheese. Fats and oils Soft margarine without trans fats. Vegetable oil. Low-fat, reduced-fat, or light mayonnaise and salad dressings (reduced-sodium). Canola, safflower, olive, soybean, and sunflower oils. Avocado. Seasoning and other foods Herbs. Spices. Seasoning mixes without salt. Unsalted popcorn and pretzels. Fat-free sweets. What foods are not recommended? The items listed may not be a complete list. Talk with your dietitian about what dietary choices are best for you. Grains Baked goods made with fat, such as croissants, muffins, or some breads. Dry pasta or rice meal packs. Vegetables Creamed or fried vegetables. Vegetables in a cheese sauce. Regular canned vegetables (not low-sodium or reduced-sodium). Regular canned tomato sauce and paste (not low-sodium or reduced-sodium). Regular tomato and vegetable juice (not low-sodium or reduced-sodium). Angie Fava. Olives. Fruits Canned fruit in a light or heavy syrup. Fried fruit. Fruit in cream  or butter sauce. Meat and other protein foods Fatty cuts of meat. Ribs. Fried meat. Berniece Salines. Sausage. Bologna and other processed lunch meats. Salami. Fatback. Hotdogs. Bratwurst. Salted nuts and seeds. Canned beans with added salt. Canned or smoked fish. Whole eggs or egg yolks. Chicken or [place] with skin. Dairy Whole or 2% milk, cream, and half-and-half. Whole or full-fat cream cheese. Whole-fat or sweetened yogurt. Full-fat cheese. Nondairy creamers. Whipped toppings. Processed cheese and cheese spreads. Fats and oils Butter. Stick margarine. Lard. Shortening. Ghee. Bacon fat. Tropical oils, such as coconut, palm kernel, or palm  oil. Seasoning and other foods Salted popcorn and pretzels. Onion salt, garlic salt, seasoned salt, table salt, and sea salt. Worcestershire sauce. Tartar sauce. Barbecue sauce. Teriyaki sauce. Soy sauce, including reduced-sodium. Steak sauce. Canned and packaged gravies. Fish sauce. Oyster sauce. Cocktail sauce. Horseradish that you find on the shelf. Ketchup. Mustard. Meat flavorings and tenderizers. Bouillon cubes. Hot sauce and Tabasco sauce. Premade or packaged marinades. Premade or packaged taco seasonings. Relishes. Regular salad dressings. Where to find more information:  National Heart, Lung, and [place]: https://wilson-eaton.com/  American Heart Association: www.heart.org Summary  The DASH eating plan is a healthy eating plan that has been shown to reduce high blood pressure (hypertension). It may also reduce your risk for type 2 diabetes, heart disease, and stroke.  With the DASH eating plan, you should limit salt (sodium) intake to 2,300 mg a day. If you have hypertension, you may need to reduce your sodium intake to 1,500 mg a day.  When on the DASH eating plan, aim to eat more fresh fruits and vegetables, whole grains, lean proteins, low-fat dairy, and heart-healthy fats.  Work with your health care provider or diet and nutrition specialist (dietitian) to adjust your eating plan to your individual calorie needs. This information is not intended to replace advice given to you by your health care provider. Make sure you discuss any questions you have with your health care provider. Document Revised: 05/03/2017 Document Reviewed: 05/14/2016 Elsevier Patient Education  2020 [employer].

## 2019-09-17 DIAGNOSIS — H353231 Exudative age-related macular degeneration, bilateral, with active choroidal neovascularization: Secondary | ICD-10-CM | POA: Diagnosis not present

## 2019-10-05 ENCOUNTER — Other Ambulatory Visit (HOSPITAL_COMMUNITY): Payer: Self-pay | Admitting: Nurse Practitioner

## 2019-10-05 DIAGNOSIS — Z23 Encounter for immunization: Secondary | ICD-10-CM | POA: Diagnosis not present

## 2019-10-06 NOTE — Telephone Encounter (Signed)
Pt's pharmacy is requesting a refill on furosemide. Would Dr. Acie Fredrickson like to refill this medication? Please address

## 2019-10-09 ENCOUNTER — Ambulatory Visit: Payer: Medicare Other | Admitting: Cardiovascular Disease

## 2019-10-12 ENCOUNTER — Telehealth (HOSPITAL_COMMUNITY): Payer: Self-pay

## 2019-10-12 ENCOUNTER — Other Ambulatory Visit: Payer: Self-pay

## 2019-10-12 ENCOUNTER — Ambulatory Visit (HOSPITAL_COMMUNITY)
Admission: RE | Admit: 2019-10-12 | Discharge: 2019-10-12 | Disposition: A | Discharge status: Home / Self Care | Payer: Medicare Other | Source: Ambulatory Visit | Attending: Nurse Practitioner | Admitting: Nurse Practitioner

## 2019-10-12 ENCOUNTER — Encounter (HOSPITAL_COMMUNITY): Payer: Self-pay | Admitting: Nurse Practitioner

## 2019-10-12 VITALS — BP 140/100 | HR 107 | Ht 65.5 in | Wt 147.0 lb

## 2019-10-12 DIAGNOSIS — Z961 Presence of intraocular lens: Secondary | ICD-10-CM | POA: Insufficient documentation

## 2019-10-12 DIAGNOSIS — H3530 Unspecified macular degeneration: Secondary | ICD-10-CM | POA: Insufficient documentation

## 2019-10-12 DIAGNOSIS — Z79899 Other long term (current) drug therapy: Secondary | ICD-10-CM | POA: Diagnosis not present

## 2019-10-12 DIAGNOSIS — Z9842 Cataract extraction status, left eye: Secondary | ICD-10-CM | POA: Diagnosis not present

## 2019-10-12 DIAGNOSIS — Z888 Allergy status to other drugs, medicaments and biological substances status: Secondary | ICD-10-CM | POA: Diagnosis not present

## 2019-10-12 DIAGNOSIS — I129 Hypertensive chronic kidney disease with stage 1 through stage 4 chronic kidney disease, or unspecified chronic kidney disease: Secondary | ICD-10-CM | POA: Insufficient documentation

## 2019-10-12 DIAGNOSIS — I252 Old myocardial infarction: Secondary | ICD-10-CM | POA: Insufficient documentation

## 2019-10-12 DIAGNOSIS — N183 Chronic kidney disease, stage 3 unspecified: Secondary | ICD-10-CM | POA: Insufficient documentation

## 2019-10-12 DIAGNOSIS — I480 Paroxysmal atrial fibrillation: Secondary | ICD-10-CM | POA: Insufficient documentation

## 2019-10-12 DIAGNOSIS — Z882 Allergy status to sulfonamides status: Secondary | ICD-10-CM | POA: Insufficient documentation

## 2019-10-12 DIAGNOSIS — Z808 Family history of malignant neoplasm of other organs or systems: Secondary | ICD-10-CM | POA: Insufficient documentation

## 2019-10-12 DIAGNOSIS — D6869 Other thrombophilia: Secondary | ICD-10-CM

## 2019-10-12 DIAGNOSIS — Z85828 Personal history of other malignant neoplasm of skin: Secondary | ICD-10-CM | POA: Diagnosis not present

## 2019-10-12 DIAGNOSIS — Z800 Family history of malignant neoplasm of digestive organs: Secondary | ICD-10-CM | POA: Insufficient documentation

## 2019-10-12 DIAGNOSIS — Z7901 Long term (current) use of anticoagulants: Secondary | ICD-10-CM | POA: Diagnosis not present

## 2019-10-12 DIAGNOSIS — Z8249 Family history of ischemic heart disease and other diseases of the circulatory system: Secondary | ICD-10-CM | POA: Insufficient documentation

## 2019-10-12 DIAGNOSIS — E860 Dehydration: Secondary | ICD-10-CM | POA: Insufficient documentation

## 2019-10-12 DIAGNOSIS — Z9861 Coronary angioplasty status: Secondary | ICD-10-CM | POA: Insufficient documentation

## 2019-10-12 DIAGNOSIS — Z880 Allergy status to penicillin: Secondary | ICD-10-CM | POA: Diagnosis not present

## 2019-10-12 DIAGNOSIS — I4891 Unspecified atrial fibrillation: Secondary | ICD-10-CM | POA: Diagnosis present

## 2019-10-12 DIAGNOSIS — Z87891 Personal history of nicotine dependence: Secondary | ICD-10-CM | POA: Diagnosis not present

## 2019-10-12 DIAGNOSIS — Z91041 Radiographic dye allergy status: Secondary | ICD-10-CM | POA: Diagnosis not present

## 2019-10-12 DIAGNOSIS — I4819 Other persistent atrial fibrillation: Secondary | ICD-10-CM

## 2019-10-12 DIAGNOSIS — Z9841 Cataract extraction status, right eye: Secondary | ICD-10-CM | POA: Insufficient documentation

## 2019-10-12 DIAGNOSIS — E785 Hyperlipidemia, unspecified: Secondary | ICD-10-CM | POA: Diagnosis not present

## 2019-10-12 LAB — BASIC METABOLIC PANEL
Anion gap: 11 (ref 5–15)
BUN: 26 mg/dL — ABNORMAL HIGH (ref 8–23)
CO2: 27 mmol/L (ref 22–32)
Calcium: 9.2 mg/dL (ref 8.9–10.3)
Chloride: 106 mmol/L (ref 98–111)
Creatinine, Ser: 1.68 mg/dL — ABNORMAL HIGH (ref 0.44–1.00)
GFR calc Af Amer: 30 mL/min — ABNORMAL LOW (ref >60)
GFR calc non Af Amer: 26 mL/min — ABNORMAL LOW (ref >60)
Glucose, Bld: 106 mg/dL — ABNORMAL HIGH (ref 70–99)
Potassium: 5.4 mmol/L — ABNORMAL HIGH (ref 3.5–5.1)
Sodium: 144 mmol/L (ref 135–145)

## 2019-10-12 LAB — CBC
HCT: 44.4 % (ref 36.0–46.0)
Hemoglobin: 13.2 g/dL (ref 12.0–15.0)
MCH: 28.6 pg (ref 26.0–34.0)
MCHC: 29.7 g/dL — ABNORMAL LOW (ref 30.0–36.0)
MCV: 96.3 fL (ref 80.0–100.0)
Platelets: 212 10*3/uL (ref 150–400)
RBC: 4.61 MIL/uL (ref 3.87–5.11)
RDW: 16.1 % — ABNORMAL HIGH (ref 11.5–15.5)
WBC: 6.7 10*3/uL (ref 4.0–10.5)
nRBC: 0 % (ref 0.0–0.2)

## 2019-10-12 MED ORDER — FUROSEMIDE 20 MG PO TABS: ORAL_TABLET | ORAL | 0 refills | Status: DC

## 2019-10-12 MED ORDER — METOPROLOL TARTRATE 50 MG PO TABS: ORAL_TABLET | ORAL | Status: DC

## 2019-10-12 NOTE — Telephone Encounter (Signed)
Left message for patient to call back regarding her lab results

## 2019-10-12 NOTE — Progress Notes (Signed)
Date:  10/12/2019   ID:  Shelly Obrien, DOB Mar 06, 1923, MRN [id number]  Location:office  Provider location: [address] [place], [place] [address] Evaluation Performed: Follow up  PCP:  [name], MD  Primary Cardiologist: [name] Primary Electrophysiologist: afib clinic  CC: afib   History of Present Illness: Shelly Obrien is a 86 y.o. female with h/o CAD, s/p MI,  of afib  who presents in office   for irregular heart beat for about one week.  She  called in earlier in week, had increased her metoprolol to bid and felt better. New RX was sent for metoprolol ER bid . In office today, ekg shows atrial flutter with v rate of 107 bpm. She denies any change in her general health. Continues on eliquis 5 mg bid for a CHA2DS2VASc score of at least 4. She  changed from Cardizem to metoprolol last spring for a rash that improved off drug.   F/u in afib clinic, 07/20/19. She went to the ER after I saw her as she lives alone and has not family and was concerned as she was short of breath. She  was admitted and successfully cardioverted. Her BP was elevated and she was started on Losartan 50 mg daily and Toprol was increased to 50 mg bid. Since being home and despite being in [place], she has felt  more fatigued. Echo in the hospital showed decreased  EF at 25-30 %  With regional wall motion abnormalities.  Outpt w/u for ischemia with Dr. Acie Fredrickson was discussed. She was also placed on lasix x 3 days.  F/u in afib clinic, 08/31/19.She saw Dr. Acie Fredrickson 07/27/19 and was started on amiodarone 200 mg bid as she was out of rhythm. She  called the office 3/4 and was c/o dizziness, being drunk. Amiodarone was decreased to 200 mg daily. She  is being seen today as she called in this am not feeling well. EKG shows persistent afib, rate controlled. She has continued c/o's of being weak and shortness of breath. She  has had to sit sleeping upright a couple of nights. Her weight was up 4 lbs.lasix was added for 3 days prior  to cardioversion.  She had successful cardioversion but with bradycardia and PC's. She had f/u with Dr. Acie Fredrickson and had ERAF. Amiodarone was stopped as pt did not have her meds with her and was not sure she was on it. She is  Usually  somewhat confused re her meds.  F/u in afib clinic, 5/10. "I cant go on like this." Pt states that she is weak and it is because she is not  tolerating the eliquis. She also reports that she can eat well and take her meds without any swallowing difficulties and enen  drink a daily Pepsi, but every time she drinks water, it comes right back up. She  states that  she is not voiding as much and is questioning if she is dehydrated. She  feels all of her ailments are coming from  eliquis. She also  describes some itching all over but also complained  of this with diltiazem so I switched her to metoprolol. Ekg  shows afib at 107 bpm.   Today, she denies symptoms of palpitations, chest pain, shortness of breath, orthopnea, PND, lower extremity edema, claudication, dizziness, presyncope, syncope, bleeding, or neurologic sequela. She is weak, swallowing issues with water, itching. The patient is tolerating medications without difficulties and is otherwise without complaint today.   she denies  symptoms of cough, fevers, chills, or new SOB worrisome for COVID 19.   she has a BMI of Body mass index is 24.09 kg/m.[name]   Past Medical History:  Diagnosis Date  . Aortic ectasia ([place])   . Arm fracture 2013   right, Dr. Sharen Heck  . B12 deficiency   . CAD S/P percutaneous coronary angioplasty    hx MI 04/ 1994  s/p  PCI to LAD  . CKD (chronic kidney disease), stage III   . Complication of anesthesia    hard to wake   . Gallstones   . History of basal cell carcinoma (BCC) excision    right eye area 08/ 2013  . History of kidney stones 01/20/2017  . History of MI (myocardial infarction) 09/1992   s/p  PCI to LAD  . History of squamous cell carcinoma excision    right lower leg 2015  ;  2016  . Hx of colonic polyps   . Hyperlipidemia   . Hypertension   . Macular degeneration   . Osteoporosis   . PAF (paroxysmal atrial fibrillation) ([place]) dx 02-24-2016   followed by cardiologist-- dr Mamie Nick. [name]  . Pernicious anemia   . PONV (postoperative nausea and vomiting)   . Rib fracture 07/2018   left  . Right ureteral stone   . Wears glasses    Past Surgical History:  Procedure Laterality Date  . ABDOMINAL HYSTERECTOMY    . BREAST EXCISIONAL BIOPSY Left 11/02/2009   fibroadenoma (lumpectomy)  . BROW LIFT Right 04/07/2014   Procedure: REPAIR OF ENTROPION AND TRICHIASIS OF RIGHT LOWER EYE LID ;  Surgeon: Theodoro Kos, DO;  Location: [facility];  Service: Plastics;  Laterality: Right;  . CARDIOVERSION N/A 07/18/2017   Procedure: CARDIOVERSION;  Surgeon: Skeet Latch, MD;  Location: [facility];  Service: Cardiovascular;  Laterality: N/A;  . CARDIOVERSION N/A 07/13/2019   Procedure: CARDIOVERSION;  Surgeon: Josue Hector, MD;  Location: [facility] ENDOSCOPY;  Service: Cardiovascular;  Laterality: N/A;  . CARDIOVERSION N/A 09/03/2019   Procedure: CARDIOVERSION;  Surgeon: Buford Dresser, MD;  Location: [facility] ENDOSCOPY;  Service: Cardiovascular;  Laterality: N/A;  . CATARACT EXTRACTION W/ INTRAOCULAR LENS  IMPLANT, BILATERAL    . CHOLECYSTECTOMY N/A 12/06/2017   Procedure: LAPAROSCOPIC CHOLECYSTECTOMY;  Surgeon: Jovita Kussmaul, MD;  Location: [facility];  Service: General;  Laterality: N/A;  . CORONARY ANGIOPLASTY  04/ 1994  dr Lia Foyer   PCI to LAD  . CORONARY ANGIOPLASTY    . CYSTOSCOPY W/ URETERAL STENT PLACEMENT Right 01/20/2017   Procedure: CYSTOSCOPY WITH RIGHT RETROGRADE PYELOGRAM/RIGHT URETERAL STENT PLACEMENT;  Surgeon: Alexis Frock, MD;  Location: [facility];  Service: Urology;  Laterality: Right;  . CYSTOSCOPY WITH RETROGRADE PYELOGRAM, URETEROSCOPY AND STENT PLACEMENT Right 02/15/2017   Procedure: CYSTOSCOPY WITH RETROGRADE PYELOGRAM, URETEROSCOPY, STONE BASKETRY  AND STENT REPLACEMENT;  Surgeon: Alexis Frock, MD;  Location: [facility];  Service: Urology;  Laterality: Right;  . CYSTOSCOPY/RETROGRADE/URETEROSCOPY/STONE EXTRACTION WITH BASKET  yrs ago  . DILATION AND CURETTAGE OF UTERUS    . HOLMIUM LASER APPLICATION Right [id number]   Procedure: HOLMIUM LASER APPLICATION;  Surgeon: Alexis Frock, MD;  Location[facility];  Service: Urology;  Laterality: Right;  . KNEE SURGERY     right  . NEUROPLASTY / TRANSPOSITION ULNAR NERVE AT ELBOW Left 02-23-2000    [facility]   and Left Carpal Tunnel Release  . r eyelid cancer    . TONSILLECTOMY    . TRANSTHORACIC ECHOCARDIOGRAM  02/26/2016  moderate focal basal and mild concentric LVH,  ef 55-60%,  akinesis of the basal-midanteroseptal, inferoseptal and apicalinferior myocardium, study not sufficient to evaluation diastolic funciton due to atrial fib./  trivial PR/  mild TR  . UMBILICAL HERNIA REPAIR       Current Outpatient Medications  Medication Sig Dispense Refill  . DHA-EPA-Flaxseed Oil-Vitamin E (THERA TEARS NUTRITION PO) Take 1 drop by mouth daily as needed (watery eyes).    [name] ELIQUIS 5 MG TABS tablet TAKE 1 TABLET BY MOUTH TWICE DAILY (Patient taking differently: Take 5 mg by mouth 2 (two) times daily. ) 60 tablet 6  . fluocinonide (LIDEX) 0.05 % external solution Apply 1 application topically as needed (itching).     . furosemide (LASIX) 20 MG tablet Taking two tablets by mouth daily  0  . metoprolol tartrate (LOPRESSOR) 50 MG tablet Take 75mg  by mouth twice daily    . Multiple Vitamins-Minerals (ICAPS) CAPS Take 1 capsule by mouth 2 (two) times daily.     [name] Phenylephrine HCl (NOSE DROPS NA) Place 1 drop into the nose daily as needed (alleriges).    . triamcinolone (KENALOG) 0.025 % cream Apply 1 application topically as needed.     . triamcinolone cream (KENALOG) 0.1 % Apply 1 application topically as needed.      No current facility-administered medications for  this encounter.    Allergies:   Antihistamines, chlorpheniramine-type; Penicillins; Contrast media [iodinated diagnostic agents]; Sulfonamide derivatives; Atorvastatin; and Pheniramine   Social History:  The patient  reports that she has quit smoking. Her smoking use included cigarettes. She quit after 5.00 years of use. She has never used smokeless tobacco. She reports that she does not drink alcohol or use drugs.   Family History:  The patient's  family history includes Brain cancer in her son; Cancer in an other family member; Coronary artery disease in an other family member; Liver cancer in her daughter.    ROS:  Please see the history of present illness.   All other systems are personally reviewed and negative.   Exam:GEN- The patient is well appearing, alert and oriented x 3 today.   Head- normocephalic, atraumatic Eyes-  Sclera clear, conjunctiva pink Ears- hearing intact Oropharynx- clear Neck- supple, no JVP Lymph- no cervical lymphadenopathy Lungs- Clear to ausculation bilaterally, normal work of breathing Heart-irregular rate and rhythm, no murmurs, rubs or gallops, PMI not laterally displaced GI- soft, NT, ND, + BS Extremities- no clubbing, cyanosis, or edema MS- no significant deformity or atrophy Skin- no rash or lesion Psych- euthymic mood, full affect Neuro- strength and sensation are intact   Recent Labs: 07/12/2019: Magnesium 2.0 07/17/2019: BNP 247.3 08/31/2019: ALT 24; TSH 2.102 09/03/2019: BUN 25; Creatinine, Ser 1.40; Potassium 4.6; Sodium 142 10/12/2019: Hemoglobin 13.2; Platelets 212  EKG- Sinus brady at 58 bpm, pr int 186 ms, qrs int 106 ms, qtc 418 ms      ASSESSMENT AND PLAN:  1. PAF Successfully cardioverted while hospitalized 2/7 thru 2/9 Unfortunately was back in afib 2/22 with Dr. Acie Fredrickson and was started on amiodarone 200 mg bid  She was on this dose for about one week and did not tolerate, has been on 200 mg daily since then Cardioversion April  1, initially sinus brady with PAC's and then ERAF I feel rate control will be  her option going forward She is not optimally rate controlled I will increase metoprolol to 50 mg bid   2. HTN Elevated  Losartan was stopped by pt  and she states she did not tolerate it Hopefully, increase in BB will help lower  3. CHA2DS2VASc score of 4 Continue  eliquis 5 mg bid  She feels all of her symptoms stem form eliquis Her biggest complaint today is that of issues with drinking water which will immediately come back up, but can tolerate food, pills, pepsi's May be able to tolerate Boost for further hydration as it is thicker  I will forward my note as she has an appointment with Dr. Laurann Montana later in the months but feels she needs to be seen earlier We discussed changing to coumadin but she is not at the  point to do this yet.  [name], when  pt was contacted by phone with lab results, which sshows mild dehydration, she said she had been taking lasix 20 bid . I am not sure where she got this RX as I only gave her 10 tabs with no refill. I feel this is contributing to her dehydration. She was told not to take any further lasix until she sees Dr. Acie Fredrickson 5/14. She will need bmet repeated.   Labs/ tests ordered today include:none Orders Placed This Encounter  Procedures  . Basic metabolic panel  . CBC  . EKG 12-Lead    Patient Risk:  after full review of this patients clinical status, I feel that they are at high risk at this time.   F/u with Dr. Acie Fredrickson 5/14  Signed, Roderic Palau NP 10/12/2019 3:29 PM  [facility] [address] [place], [place] [address] [phone]

## 2019-10-12 NOTE — Patient Instructions (Signed)
Increase Metoprolol dose to 75mg  twice daily-

## 2019-10-16 ENCOUNTER — Ambulatory Visit (INDEPENDENT_AMBULATORY_CARE_PROVIDER_SITE_OTHER): Payer: Medicare Other | Admitting: Cardiovascular Disease

## 2019-10-16 ENCOUNTER — Other Ambulatory Visit: Payer: Self-pay

## 2019-10-16 ENCOUNTER — Encounter: Payer: Self-pay | Admitting: Cardiovascular Disease

## 2019-10-16 VITALS — BP 128/76 | HR 98 | Ht 65.5 in | Wt 152.2 lb

## 2019-10-16 DIAGNOSIS — I2510 Atherosclerotic heart disease of native coronary artery without angina pectoris: Secondary | ICD-10-CM | POA: Diagnosis not present

## 2019-10-16 DIAGNOSIS — I480 Paroxysmal atrial fibrillation: Secondary | ICD-10-CM | POA: Diagnosis not present

## 2019-10-16 DIAGNOSIS — I10 Essential (primary) hypertension: Secondary | ICD-10-CM | POA: Diagnosis not present

## 2019-10-16 NOTE — Patient Instructions (Signed)
Medication Instructions:  Your physician recommends that you continue on your current medications as directed. Please refer to the Current Medication list given to you today.  *If you need a refill on your cardiac medications before your next appointment, please call your pharmacy*   Lab Work: None Ordered If you have labs (blood work) drawn today and your tests are completely normal, you will receive your results only by: . MyChart Message (if you have MyChart) OR . A paper copy in the mail If you have any lab test that is abnormal or we need to change your treatment, we will call you to review the results.   Testing/Procedures: None Ordered   Follow-Up: At [facility], you and your health needs are our priority.  As part of our continuing mission to provide you with exceptional heart care, we have created designated Provider Care Teams.  These Care Teams include your primary Cardiologist (physician) and Advanced Practice Providers (APPs -  Physician Assistants and Nurse Practitioners) who all work together to provide you with the care you need, when you need it.  We recommend signing up for the patient portal called "MyChart".  Sign up information is provided on this After Visit Summary.  MyChart is used to connect with patients for Virtual Visits (Telemedicine).  Patients are able to view lab/test results, encounter notes, upcoming appointments, etc.  Non-urgent messages can be sent to your provider as well.   To learn more about what you can do with MyChart, go to [email or web address].    Your next appointment:   6 month(s)  The format for your next appointment:   Either In Person or Virtual  Provider:   Scott Weaver, PA-C or Vin Bhagat, PA-C    

## 2019-10-16 NOTE — Progress Notes (Signed)
Cardiology Office Note:    Date:  10/16/2019   ID:  Shelly Obrien, DOB [birth year], MRN [id number]  PCP:  [name], MD  Cardiologist:  Mertie Moores, MD  Electrophysiologist:  None   Referring MD: [name], MD   Chief Complaint  Patient presents with  . Atrial Fibrillation     Nov. 18, 2019   Shelly Obrien is a 86 y.o. female with a hx of coronary artery disease and atrial fibrillation. She is seen for the first time today.  Transfer r from Dr. [name].  Had a cardioversion several months ago - seems to be better . Has been seen in the Afib clinic ,  Is on Eliquis 5  BID  Has had some issues with balance.   Lives at home by herself - townhouse  Hx of CAD in the past Had PTCA - years ago   February 04, 2019 Shelly Obrien is an elderly female with a history of coronary artery disease and atrial fibrillation.  She seems to be doing well. No cp or dyspnea.  Lives independantly .   Feb. 22, 2021 Shelly Obrien is seen today for follow up of her CAD, chronic systolic CHF,  and Afib She was admitted several weeks ago with worsening dyspnea and was found to be in AF. She had a cardioversion. Still has some weakness.   DOE No CP .   She was offered cardiac cath while in the hospital  She has moderate pulmonary artery HTN . 09-14-2019: Shelly Obrien seen back for follow-up visit. She is 86 year old female with a history of coronary artery disease, atrial fibrillation.   She was seen in the AF clinic .  She had a cardioversion recently. She has been on amiodarone. She has chronic systolic congestive heart failure with an ejection fraction of 25 to 30%. She does not think she is on amiodarone any longer.  Does not know   She stopped the Losartan on her own - make her feel bad    She is seen back today for follow up  She is back in Afib.  Has weakness and DOE with walking  Has no energy.   Has severe LV dysfunction with EF 25-30%.  Has moderate - severe pulmonary HTN  Still eating  salt and salty foods.    Oct 16, 2019:  Shelly Obrien seen back today for follow-up visit.  I saw her approximately 1 month ago following cardioversion.   She had a successful cardioversion but was back in atrial fibrillation when I saw her.  She has generalized weakness as well as shortness of breath with walking.  She is was previously on amiodarone but somehow this disappeared from her list. She was also previously on losartan but she stopped it because it made her feel bad.  She was seen in the A. fib clinic on May 10.  She was back in atrial fibrillation/atrial flutter with a heart rate of 107.  She has had difficulty swallowing water.  She has been on Lasix.  When she saw Roderic Palau in the A. fib clinic on May 10, she was volume depleted.  Her creatinine and BUN were both higher than normal. Is unable to drink water - it comes right back up   She has numerous complaints  Has multiple allergies.  Nausea and diarrhea frequently .      Past Medical History:  Diagnosis Date  . Aortic ectasia ([place])   . Arm fracture 2013   right, Dr.  Orderman  . B12 deficiency   . CAD S/P percutaneous coronary angioplasty    hx MI 04/ 1994  s/p  PCI to LAD  . CKD (chronic kidney disease), stage III   . Complication of anesthesia    hard to wake   . Gallstones   . History of basal cell carcinoma (BCC) excision    right eye area 08/ 2013  . History of kidney stones 01/20/2017  . History of MI (myocardial infarction) 09/1992   s/p  PCI to LAD  . History of squamous cell carcinoma excision    right lower leg 2015 ;  2016  . Hx of colonic polyps   . Hyperlipidemia   . Hypertension   . Macular degeneration   . Osteoporosis   . PAF (paroxysmal atrial fibrillation) ([place]) dx 02-24-2016   followed by cardiologist-- dr Mamie Nick. [name]  . Pernicious anemia   . PONV (postoperative nausea and vomiting)   . Rib fracture 07/2018   left  . Right ureteral stone   . Wears glasses     Past Surgical History:    Procedure Laterality Date  . ABDOMINAL HYSTERECTOMY    . BREAST EXCISIONAL BIOPSY Left 11/02/2009   fibroadenoma (lumpectomy)  . BROW LIFT Right 04/07/2014   Procedure: REPAIR OF ENTROPION AND TRICHIASIS OF RIGHT LOWER EYE LID ;  Surgeon: Theodoro Kos, DO;  Location: [facility];  Service: Plastics;  Laterality: Right;  . CARDIOVERSION N/A 07/18/2017   Procedure: CARDIOVERSION;  Surgeon: Skeet Latch, MD;  Location: [facility];  Service: Cardiovascular;  Laterality: N/A;  . CARDIOVERSION N/A 07/13/2019   Procedure: CARDIOVERSION;  Surgeon: Josue Hector, MD;  Location: [facility] ENDOSCOPY;  Service: Cardiovascular;  Laterality: N/A;  . CARDIOVERSION N/A 09/03/2019   Procedure: CARDIOVERSION;  Surgeon: Buford Dresser, MD;  Location: [facility] ENDOSCOPY;  Service: Cardiovascular;  Laterality: N/A;  . CATARACT EXTRACTION W/ INTRAOCULAR LENS  IMPLANT, BILATERAL    . CHOLECYSTECTOMY N/A 12/06/2017   Procedure: LAPAROSCOPIC CHOLECYSTECTOMY;  Surgeon: Jovita Kussmaul, MD;  Location: [facility];  Service: General;  Laterality: N/A;  . CORONARY ANGIOPLASTY  04/ 1994  dr Lia Foyer   PCI to LAD  . CORONARY ANGIOPLASTY    . CYSTOSCOPY W/ URETERAL STENT PLACEMENT Right 01/20/2017   Procedure: CYSTOSCOPY WITH RIGHT RETROGRADE PYELOGRAM/RIGHT URETERAL STENT PLACEMENT;  Surgeon: Alexis Frock, MD;  Location: [facility];  Service: Urology;  Laterality: Right;  . CYSTOSCOPY WITH RETROGRADE PYELOGRAM, URETEROSCOPY AND STENT PLACEMENT Right 02/15/2017   Procedure: CYSTOSCOPY WITH RETROGRADE PYELOGRAM, URETEROSCOPY, STONE BASKETRY AND STENT REPLACEMENT;  Surgeon: Alexis Frock, MD;  Location: [facility];  Service: Urology;  Laterality: Right;  . CYSTOSCOPY/RETROGRADE/URETEROSCOPY/STONE EXTRACTION WITH BASKET  yrs ago  . DILATION AND CURETTAGE OF UTERUS    . HOLMIUM LASER APPLICATION Right [id number]   Procedure: HOLMIUM LASER APPLICATION;  Surgeon: Alexis Frock, MD;  Location: [facility];  Service: Urology;  Laterality: Right;  . KNEE SURGERY     right  . NEUROPLASTY / TRANSPOSITION ULNAR NERVE AT ELBOW Left 02-23-2000    [facility]   and Left Carpal Tunnel Release  . r eyelid cancer    . TONSILLECTOMY    . TRANSTHORACIC ECHOCARDIOGRAM  02/26/2016   moderate focal basal and mild concentric LVH,  ef 55-60%,  akinesis of the basal-midanteroseptal, inferoseptal and apicalinferior myocardium, study not sufficient to evaluation diastolic funciton due to atrial fib./  trivial PR/  mild TR  . UMBILICAL HERNIA REPAIR  Current Medications: Current Meds  Medication Sig  . DHA-EPA-Flaxseed Oil-Vitamin E (THERA TEARS NUTRITION PO) Take 1 drop by mouth daily as needed (watery eyes).  [name] ELIQUIS 5 MG TABS tablet TAKE 1 TABLET BY MOUTH TWICE DAILY  . fluocinonide (LIDEX) 0.05 % external solution Apply 1 application topically as needed (itching).   . metoprolol tartrate (LOPRESSOR) 50 MG tablet Take 75mg  by mouth twice daily  . Multiple Vitamins-Minerals (ICAPS) CAPS Take 1 capsule by mouth 2 (two) times daily.   [name] Phenylephrine HCl (NOSE DROPS NA) Place 1 drop into the nose daily as needed (alleriges).  . triamcinolone (KENALOG) 0.025 % cream Apply 1 application topically as needed.   . triamcinolone cream (KENALOG) 0.1 % Apply 1 application topically as needed.      Allergies:   Antihistamines, chlorpheniramine-type; Penicillins; Contrast media [iodinated diagnostic agents]; Sulfonamide derivatives; Atorvastatin; and Pheniramine   Social History   Socioeconomic History  . Marital status: Widowed    Spouse name: Not on file  . Number of children: 2  . Years of education: Not on file  . Highest education level: Not on file  Occupational History  . Occupation: Retired  Tobacco Use  . Smoking status: Former Smoker    Years: 5.00    Types: Cigarettes  . Smokeless tobacco: Never Used  . Tobacco comment: "didn't have the habit that much", just smoked "with the girls"   Substance and Sexual Activity  . Alcohol use: No    Alcohol/week: 0.0 standard drinks  . Drug use: No  . Sexual activity: Not on file  Other Topics Concern  . Not on file  Social History Narrative   Lives at home alone   Right handed   Social Determinants of Health   Financial Resource Strain:   . Difficulty of Paying Living Expenses:   Food Insecurity:   . Worried About Charity fundraiser in the Last Year:   . Arboriculturist in the Last Year:   Transportation Needs:   . Film/video editor (Medical):   [name] Lack of Transportation (Non-Medical):   Physical Activity:   . Days of Exercise per Week:   . Minutes of Exercise per Session:   Stress:   . Feeling of Stress :   Social Connections:   . Frequency of Communication with Friends and Family:   . Frequency of Social Gatherings with Friends and Family:   . Attends Religious Services:   . Active Member of Clubs or Organizations:   . Attends Archivist Meetings:   [name] Marital Status:      Family History: The patient's family history includes Brain cancer in her son; Cancer in an other family member; Coronary artery disease in an other family member; Liver cancer in her daughter. There is no history of Neuropathy.  ROS:   Please see the history of present illness.    All other systems reviewed and are negative.  EKGs/Labs/Other Studies Reviewed:      EKG:      Recent Labs: 07/12/2019: Magnesium 2.0 07/17/2019: BNP 247.3 08/31/2019: ALT 24; TSH 2.102 10/12/2019: BUN 26; Creatinine, Ser 1.68; Hemoglobin 13.2; Platelets 212; Potassium 5.4; Sodium 144  Recent Lipid Panel    Component Value Date/Time   CHOL 166 05/07/2018 1041   TRIG 87 05/07/2018 1041   HDL 81 05/07/2018 1041   CHOLHDL 2.0 05/07/2018 1041   LDLCALC 68 05/07/2018 1041    Physical Exam:     Physical Exam: Blood pressure  128/76, pulse 98, height 5' 5.5" (1.664 m), weight 152 lb 4 oz (69.1 kg), SpO2 96 %.  GEN:  Elderly female,   Weak,  chronically ill.  Examined in wheel chair  HEENT: Normal NECK: No JVD; No carotid bruits LYMPHATICS: No lymphadenopathy CARDIAC:  Irreg. Irreg.  RESPIRATORY:  Clear to auscultation without rales, wheezing or rhonchi  ABDOMEN: Soft, non-tender, non-distended MUSCULOSKELETAL:  No edema; No deformity  SKIN: Warm and dry NEUROLOGIC:  Alert and oriented x 3    ASSESSMENT:    No diagnosis found. PLAN:     1.  CAD -    she denies any episodes of angina.  2.  Atrial fib:  [name]  She did not tolerate amiodarone.  She has numerous complaints about all of her medications.  I think her best strategy is to continue with anticoagulation and rate control.  Currently her heart rate is fairly well controlled on metoprolol 150 mg a day.  She has been taken 50 mg three times a day.  I told her that she could take them altogether or perhaps take 100 mg in the morning with 50 mg at night.  I think her largest problem right now is that she is volume depleted.  Three.  Dysphagia: She has trouble swallowing water.  She swallows it and then it comes right back up.  She will be seeing her primary medical doctor next week.  I suspect she will need a swallowing study. She is volume depleted as a result of her inability to swallow and drink water adequately.  3.  Chronic systolic congestive heart failure:  .     We will have her follow-up with an APP in 6 months.  I will plan on seeing her again in 1 year.   Medication Adjustments/Labs and Tests Ordered: Current medicines are reviewed at length with the patient today.  Concerns regarding medicines are outlined above.  No orders of the defined types were placed in this encounter.  No orders of the defined types were placed in this encounter.    There are no Patient Instructions on file for this visit.   Signed, Mertie Moores, MD  10/16/2019 10:08 AM    [place]

## 2019-10-17 ENCOUNTER — Emergency Department (HOSPITAL_COMMUNITY): Payer: Medicare Other

## 2019-10-17 ENCOUNTER — Encounter (HOSPITAL_COMMUNITY): Payer: Self-pay | Admitting: Emergency Medicine

## 2019-10-17 ENCOUNTER — Inpatient Hospital Stay (HOSPITAL_COMMUNITY)
Admission: EM | Admit: 2019-10-17 | Discharge: 2019-10-20 | DRG: 603 | Disposition: A | Discharge status: Home / Self Care | Payer: Medicare Other | Attending: Student | Admitting: Student

## 2019-10-17 DIAGNOSIS — D72825 Bandemia: Secondary | ICD-10-CM | POA: Diagnosis not present

## 2019-10-17 DIAGNOSIS — S81802A Unspecified open wound, left lower leg, initial encounter: Secondary | ICD-10-CM | POA: Diagnosis not present

## 2019-10-17 DIAGNOSIS — L0390 Cellulitis, unspecified: Secondary | ICD-10-CM | POA: Diagnosis present

## 2019-10-17 DIAGNOSIS — E860 Dehydration: Secondary | ICD-10-CM | POA: Diagnosis not present

## 2019-10-17 DIAGNOSIS — K224 Dyskinesia of esophagus: Secondary | ICD-10-CM | POA: Diagnosis present

## 2019-10-17 DIAGNOSIS — E785 Hyperlipidemia, unspecified: Secondary | ICD-10-CM | POA: Diagnosis present

## 2019-10-17 DIAGNOSIS — Z880 Allergy status to penicillin: Secondary | ICD-10-CM

## 2019-10-17 DIAGNOSIS — M7989 Other specified soft tissue disorders: Secondary | ICD-10-CM | POA: Diagnosis not present

## 2019-10-17 DIAGNOSIS — M79609 Pain in unspecified limb: Secondary | ICD-10-CM

## 2019-10-17 DIAGNOSIS — Z888 Allergy status to other drugs, medicaments and biological substances status: Secondary | ICD-10-CM

## 2019-10-17 DIAGNOSIS — N183 Chronic kidney disease, stage 3 unspecified: Secondary | ICD-10-CM | POA: Diagnosis present

## 2019-10-17 DIAGNOSIS — Z7901 Long term (current) use of anticoagulants: Secondary | ICD-10-CM

## 2019-10-17 DIAGNOSIS — N189 Chronic kidney disease, unspecified: Secondary | ICD-10-CM | POA: Diagnosis not present

## 2019-10-17 DIAGNOSIS — I130 Hypertensive heart and chronic kidney disease with heart failure and stage 1 through stage 4 chronic kidney disease, or unspecified chronic kidney disease: Secondary | ICD-10-CM | POA: Diagnosis present

## 2019-10-17 DIAGNOSIS — I4891 Unspecified atrial fibrillation: Secondary | ICD-10-CM | POA: Diagnosis not present

## 2019-10-17 DIAGNOSIS — N179 Acute kidney failure, unspecified: Secondary | ICD-10-CM | POA: Diagnosis present

## 2019-10-17 DIAGNOSIS — Z85828 Personal history of other malignant neoplasm of skin: Secondary | ICD-10-CM | POA: Diagnosis not present

## 2019-10-17 DIAGNOSIS — Z66 Do not resuscitate: Secondary | ICD-10-CM | POA: Diagnosis present

## 2019-10-17 DIAGNOSIS — R52 Pain, unspecified: Secondary | ICD-10-CM | POA: Diagnosis not present

## 2019-10-17 DIAGNOSIS — Z9861 Coronary angioplasty status: Secondary | ICD-10-CM

## 2019-10-17 DIAGNOSIS — E875 Hyperkalemia: Secondary | ICD-10-CM | POA: Diagnosis present

## 2019-10-17 DIAGNOSIS — M79605 Pain in left leg: Secondary | ICD-10-CM | POA: Diagnosis not present

## 2019-10-17 DIAGNOSIS — I429 Cardiomyopathy, unspecified: Secondary | ICD-10-CM | POA: Diagnosis present

## 2019-10-17 DIAGNOSIS — I252 Old myocardial infarction: Secondary | ICD-10-CM | POA: Diagnosis not present

## 2019-10-17 DIAGNOSIS — I2510 Atherosclerotic heart disease of native coronary artery without angina pectoris: Secondary | ICD-10-CM | POA: Diagnosis present

## 2019-10-17 DIAGNOSIS — M810 Age-related osteoporosis without current pathological fracture: Secondary | ICD-10-CM | POA: Diagnosis present

## 2019-10-17 DIAGNOSIS — Z87891 Personal history of nicotine dependence: Secondary | ICD-10-CM | POA: Diagnosis not present

## 2019-10-17 DIAGNOSIS — G629 Polyneuropathy, unspecified: Secondary | ICD-10-CM | POA: Diagnosis present

## 2019-10-17 DIAGNOSIS — R1310 Dysphagia, unspecified: Secondary | ICD-10-CM

## 2019-10-17 DIAGNOSIS — I4821 Permanent atrial fibrillation: Secondary | ICD-10-CM | POA: Diagnosis present

## 2019-10-17 DIAGNOSIS — Z882 Allergy status to sulfonamides status: Secondary | ICD-10-CM

## 2019-10-17 DIAGNOSIS — Z800 Family history of malignant neoplasm of digestive organs: Secondary | ICD-10-CM | POA: Diagnosis not present

## 2019-10-17 DIAGNOSIS — L03116 Cellulitis of left lower limb: Secondary | ICD-10-CM | POA: Diagnosis present

## 2019-10-17 DIAGNOSIS — Z91041 Radiographic dye allergy status: Secondary | ICD-10-CM

## 2019-10-17 DIAGNOSIS — Z03818 Encounter for observation for suspected exposure to other biological agents ruled out: Secondary | ICD-10-CM | POA: Diagnosis not present

## 2019-10-17 DIAGNOSIS — L538 Other specified erythematous conditions: Secondary | ICD-10-CM | POA: Diagnosis not present

## 2019-10-17 DIAGNOSIS — Z808 Family history of malignant neoplasm of other organs or systems: Secondary | ICD-10-CM

## 2019-10-17 DIAGNOSIS — I10 Essential (primary) hypertension: Secondary | ICD-10-CM | POA: Diagnosis present

## 2019-10-17 DIAGNOSIS — I272 Pulmonary hypertension, unspecified: Secondary | ICD-10-CM | POA: Diagnosis present

## 2019-10-17 DIAGNOSIS — Z8249 Family history of ischemic heart disease and other diseases of the circulatory system: Secondary | ICD-10-CM

## 2019-10-17 DIAGNOSIS — R7989 Other specified abnormal findings of blood chemistry: Secondary | ICD-10-CM | POA: Diagnosis not present

## 2019-10-17 DIAGNOSIS — R21 Rash and other nonspecific skin eruption: Secondary | ICD-10-CM | POA: Diagnosis present

## 2019-10-17 DIAGNOSIS — Z20822 Contact with and (suspected) exposure to covid-19: Secondary | ICD-10-CM | POA: Diagnosis present

## 2019-10-17 DIAGNOSIS — R5381 Other malaise: Secondary | ICD-10-CM | POA: Diagnosis not present

## 2019-10-17 DIAGNOSIS — I5022 Chronic systolic (congestive) heart failure: Secondary | ICD-10-CM | POA: Diagnosis not present

## 2019-10-17 LAB — COMPREHENSIVE METABOLIC PANEL
ALT: 28 U/L (ref 0–44)
AST: 31 U/L (ref 15–41)
Albumin: 3.7 g/dL (ref 3.5–5.0)
Alkaline Phosphatase: 69 U/L (ref 38–126)
Anion gap: 12 (ref 5–15)
BUN: 30 mg/dL — ABNORMAL HIGH (ref 8–23)
CO2: 22 mmol/L (ref 22–32)
Calcium: 9.8 mg/dL (ref 8.9–10.3)
Chloride: 107 mmol/L (ref 98–111)
Creatinine, Ser: 1.7 mg/dL — ABNORMAL HIGH (ref 0.44–1.00)
GFR calc Af Amer: 29 mL/min — ABNORMAL LOW (ref >60)
GFR calc non Af Amer: 25 mL/min — ABNORMAL LOW (ref >60)
Glucose, Bld: 130 mg/dL — ABNORMAL HIGH (ref 70–99)
Potassium: 5.5 mmol/L — ABNORMAL HIGH (ref 3.5–5.1)
Sodium: 141 mmol/L (ref 135–145)
Total Bilirubin: 1.4 mg/dL — ABNORMAL HIGH (ref 0.3–1.2)
Total Protein: 6.4 g/dL — ABNORMAL LOW (ref 6.5–8.1)

## 2019-10-17 LAB — CBC WITH DIFFERENTIAL/PLATELET
Abs Immature Granulocytes: 0.06 10*3/uL (ref 0.00–0.07)
Basophils Absolute: 0 10*3/uL (ref 0.0–0.1)
Basophils Relative: 0 %
Eosinophils Absolute: 0 10*3/uL (ref 0.0–0.5)
Eosinophils Relative: 0 %
HCT: 43.5 % (ref 36.0–46.0)
Hemoglobin: 12.8 g/dL (ref 12.0–15.0)
Immature Granulocytes: 1 %
Lymphocytes Relative: 9 %
Lymphs Abs: 1.1 10*3/uL (ref 0.7–4.0)
MCH: 28.7 pg (ref 26.0–34.0)
MCHC: 29.4 g/dL — ABNORMAL LOW (ref 30.0–36.0)
MCV: 97.5 fL (ref 80.0–100.0)
Monocytes Absolute: 0.9 10*3/uL (ref 0.1–1.0)
Monocytes Relative: 7 %
Neutro Abs: 9.8 10*3/uL — ABNORMAL HIGH (ref 1.7–7.7)
Neutrophils Relative %: 83 %
Platelets: 199 10*3/uL (ref 150–400)
RBC: 4.46 MIL/uL (ref 3.87–5.11)
RDW: 15.9 % — ABNORMAL HIGH (ref 11.5–15.5)
WBC: 11.8 10*3/uL — ABNORMAL HIGH (ref 4.0–10.5)
nRBC: 0 % (ref 0.0–0.2)

## 2019-10-17 LAB — SARS CORONAVIRUS 2 BY RT PCR (HOSPITAL ORDER, PERFORMED IN ~~LOC~~ HOSPITAL LAB): SARS Coronavirus 2: NEGATIVE

## 2019-10-17 LAB — LACTIC ACID, PLASMA: Lactic Acid, Venous: 1.8 mmol/L (ref 0.5–1.9)

## 2019-10-17 MED ORDER — SODIUM CHLORIDE 0.9% FLUSH
3.0000 mL | Freq: Two times a day (BID) | INTRAVENOUS | Status: DC
  Administered 2019-10-18 – 2019-10-20 (×3): 3 mL via INTRAVENOUS

## 2019-10-17 MED ORDER — CLINDAMYCIN PHOSPHATE 600 MG/50ML IV SOLN
600.0000 mg | Freq: Once | INTRAVENOUS | Status: AC
  Administered 2019-10-17: 600 mg via INTRAVENOUS
  Filled 2019-10-17: qty 50

## 2019-10-17 MED ORDER — FENTANYL CITRATE (PF) 100 MCG/2ML IJ SOLN
50.0000 ug | Freq: Once | INTRAMUSCULAR | Status: AC
  Administered 2019-10-17: 50 ug via INTRAVENOUS
  Filled 2019-10-17: qty 2

## 2019-10-17 MED ORDER — POLYVINYL ALCOHOL 1.4 % OP SOLN
1.0000 [drp] | OPHTHALMIC | Status: DC | PRN
  Filled 2019-10-17: qty 15

## 2019-10-17 MED ORDER — ACETAMINOPHEN 325 MG PO TABS: 650.0000 mg | ORAL_TABLET | Freq: Four times a day (QID) | ORAL | Status: DC | PRN

## 2019-10-17 MED ORDER — METOPROLOL TARTRATE 100 MG PO TABS
100.0000 mg | ORAL_TABLET | Freq: Every morning | ORAL | Status: DC
  Administered 2019-10-18 – 2019-10-20 (×3): 100 mg via ORAL
  Filled 2019-10-17 (×3): qty 1

## 2019-10-17 MED ORDER — SODIUM CHLORIDE 0.9 % IV BOLUS
500.0000 mL | Freq: Once | INTRAVENOUS | Status: AC
  Administered 2019-10-17: 500 mL via INTRAVENOUS

## 2019-10-17 MED ORDER — ACETAMINOPHEN 650 MG RE SUPP: 650.0000 mg | Freq: Four times a day (QID) | RECTAL | Status: DC | PRN

## 2019-10-17 MED ORDER — ONDANSETRON HCL 4 MG/2ML IJ SOLN: 4.0000 mg | Freq: Four times a day (QID) | INTRAMUSCULAR | Status: DC | PRN

## 2019-10-17 MED ORDER — METOPROLOL TARTRATE 50 MG PO TABS
50.0000 mg | ORAL_TABLET | Freq: Every day | ORAL | Status: DC
  Administered 2019-10-17 – 2019-10-19 (×3): 50 mg via ORAL
  Filled 2019-10-17 (×3): qty 1

## 2019-10-17 MED ORDER — ONDANSETRON HCL 4 MG PO TABS: 4.0000 mg | ORAL_TABLET | Freq: Four times a day (QID) | ORAL | Status: DC | PRN

## 2019-10-17 MED ORDER — ALBUTEROL SULFATE (2.5 MG/3ML) 0.083% IN NEBU: 2.5000 mg | INHALATION_SOLUTION | Freq: Four times a day (QID) | RESPIRATORY_TRACT | Status: DC | PRN

## 2019-10-17 MED ORDER — TRIAMCINOLONE ACETONIDE 0.025 % EX CREA
1.0000 "application " | TOPICAL_CREAM | CUTANEOUS | Status: DC | PRN
  Filled 2019-10-17: qty 15

## 2019-10-17 MED ORDER — HYDROCODONE-ACETAMINOPHEN 5-325 MG PO TABS
1.0000 | ORAL_TABLET | Freq: Four times a day (QID) | ORAL | Status: DC | PRN
  Administered 2019-10-17: 1 via ORAL
  Filled 2019-10-17: qty 1

## 2019-10-17 MED ORDER — APIXABAN 5 MG PO TABS
5.0000 mg | ORAL_TABLET | Freq: Two times a day (BID) | ORAL | Status: DC
  Administered 2019-10-17 – 2019-10-18 (×2): 5 mg via ORAL
  Filled 2019-10-17 (×2): qty 1

## 2019-10-17 MED ORDER — SODIUM CHLORIDE 0.9 % IV SOLN: Freq: Once | INTRAVENOUS | Status: AC

## 2019-10-17 MED ORDER — CLINDAMYCIN PHOSPHATE 600 MG/50ML IV SOLN
600.0000 mg | Freq: Three times a day (TID) | INTRAVENOUS | Status: DC
  Administered 2019-10-17 – 2019-10-20 (×9): 600 mg via INTRAVENOUS
  Filled 2019-10-17 (×10): qty 50

## 2019-10-17 MED ORDER — SODIUM CHLORIDE 0.9 % IV SOLN: INTRAVENOUS | Status: DC

## 2019-10-17 MED ORDER — FLUOCINONIDE 0.05 % EX SOLN
1.0000 "application " | CUTANEOUS | Status: DC | PRN
  Filled 2019-10-17: qty 60

## 2019-10-17 MED ORDER — METOPROLOL TARTRATE 25 MG PO TABS: 50.0000 mg | ORAL_TABLET | ORAL | Status: DC

## 2019-10-17 NOTE — Progress Notes (Signed)
VASCULAR LAB PRELIMINARY  PRELIMINARY  PRELIMINARY  PRELIMINARY  Left lower extremity venous duplex completed.    Preliminary report:  See CV proc for preliminary results.  Called report to Dr. Emelda Fear, [facility], RVT 10/17/2019, 12:44 PM

## 2019-10-17 NOTE — Progress Notes (Signed)
Pt admitted to unit.

## 2019-10-17 NOTE — ED Triage Notes (Addendum)
Pt arrives via [facility] from home for c/o LL leg pain and redness that began last night. Pt recent diagnosed with afib and started on eliquis. Does endorse hitting her lower leg a few weeks ago on a ladder, wound on anterior left shin. Saw cardiologist yesterday and told she was dehydrated. EMS VSS, HR 97. A/ox4.

## 2019-10-17 NOTE — ED Provider Notes (Signed)
[facility] EMERGENCY DEPARTMENT Provider Note   CSN: [id number] Arrival date & time: 10/17/19  [id number]     History Chief Complaint  Patient presents with   Leg Pain    Shelly Obrien is a 86 y.o. female.  The history is provided by the patient, a relative and medical records. No language interpreter was used.  Leg Pain Location:  Leg Time since incident:  1 day Injury: no   Leg location:  L lower leg Pain details:    Quality:  Aching   Radiates to:  Does not radiate   Severity:  Moderate   Onset quality:  Gradual   Duration:  1 day   Timing:  Constant   Progression:  Worsening Chronicity:  New Tetanus status:  Unknown Prior injury to area:  Yes Relieved by:  Nothing Worsened by:  Bearing weight Ineffective treatments:  None tried Associated symptoms: fatigue and swelling   Associated symptoms: no back pain, no fever, no muscle weakness, no neck pain, no numbness and no tingling        Past Medical History:  Diagnosis Date   Aortic ectasia (HCC)    Arm fracture 2013   right, Dr. Sharen Heck   B12 deficiency    CAD S/P percutaneous coronary angioplasty    hx MI 04/ 1994  s/p  PCI to LAD   CKD (chronic kidney disease), stage III    Complication of anesthesia    hard to wake    Gallstones    History of basal cell carcinoma (BCC) excision    right eye area 08/ 2013   History of kidney stones 01/20/2017   History of MI (myocardial infarction) 09/1992   s/p  PCI to LAD   History of squamous cell carcinoma excision    right lower leg 2015 ;  2016   Hx of colonic polyps    Hyperlipidemia    Hypertension    Macular degeneration    Osteoporosis    PAF (paroxysmal atrial fibrillation) ([place]) dx 02-24-2016   followed by cardiologist-- dr Mamie Nick. [name]   Pernicious anemia    PONV (postoperative nausea and vomiting)    Rib fracture 07/2018   left   Right ureteral stone    Wears glasses     Patient Active Problem List   Diagnosis Date Noted   Longstanding persistent atrial fibrillation ([place])    Cardiomyopathy ([place]) 0000000   Systolic CHF ([place]) 0000000   PAF (paroxysmal atrial fibrillation) ([place]) 07/12/2019   Peripheral neuropathy 08/12/2018   B12 deficiency 08/12/2018   Constipation    Acute on chronic cholecystitis s/p lap cholecystectomy 12/06/2017 12/04/2017   Nephrolithiasis 12/04/2017   Abdominal pain 12/04/2017   Spinal stenosis in cervical region 04/24/2017   Spinal stenosis of lumbar region 04/24/2017   Dizziness 04/24/2017   Ureteral stone with hydronephrosis 01/20/2017   Atrial fibrillation with RVR ([place]) 02/24/2016   CKD (chronic kidney disease) stage 3, GFR 30-59 ml/min (HCC) 02/24/2016   Paroxysmal atrial fibrillation ([place]) 02/24/2016   Exudative macular degeneration ([place]) 02/24/2016   CHOLELITHIASIS 02/14/2009   Hyperlipidemia 12/29/2008   Essential hypertension 12/29/2008   MYOCARDIAL INFARCTION, HX OF 12/29/2008   CAD (coronary artery disease) 12/29/2008    Past Surgical History:  Procedure Laterality Date   ABDOMINAL HYSTERECTOMY     BREAST EXCISIONAL BIOPSY Left 11/02/2009   fibroadenoma (lumpectomy)   BROW LIFT Right 04/07/2014   Procedure: REPAIR OF ENTROPION AND TRICHIASIS OF RIGHT LOWER EYE LID ;  Surgeon:  Claire Sanger, DO;  Location: [place];  Service: Plastics;  Laterality: Right;   CARDIOVERSION N/A 07/18/2017   Procedure: CARDIOVERSION;  Surgeon: Skeet Latch, MD;  Location: [place];  Service: Cardiovascular;  Laterality: N/A;   CARDIOVERSION N/A 07/13/2019   Procedure: CARDIOVERSION;  Surgeon: Josue Hector, MD;  Location: [facility];  Service: Cardiovascular;  Laterality: N/A;   CARDIOVERSION N/A 09/03/2019   Procedure: CARDIOVERSION;  Surgeon: Buford Dresser, MD;  Location: [place];  Service: Cardiovascular;  Laterality: N/A;   CATARACT EXTRACTION W/ INTRAOCULAR LENS  IMPLANT, BILATERAL      CHOLECYSTECTOMY N/A 12/06/2017   Procedure: LAPAROSCOPIC CHOLECYSTECTOMY;  Surgeon: Jovita Kussmaul, MD;  Location: [facility];  Service: General;  Laterality: N/A;   CORONARY ANGIOPLASTY  04/ 1994  dr Lia Foyer   PCI to LAD   CORONARY ANGIOPLASTY     CYSTOSCOPY W/ URETERAL STENT PLACEMENT Right 01/20/2017   Procedure: CYSTOSCOPY WITH RIGHT RETROGRADE PYELOGRAM/RIGHT URETERAL STENT PLACEMENT;  Surgeon: Alexis Frock, MD;  Location: [facility];  Service: Urology;  Laterality: Right;   CYSTOSCOPY WITH RETROGRADE PYELOGRAM, URETEROSCOPY AND STENT PLACEMENT Right 02/15/2017   Procedure: CYSTOSCOPY WITH RETROGRADE PYELOGRAM, URETEROSCOPY, STONE BASKETRY AND STENT REPLACEMENT;  Surgeon: Alexis Frock, MD;  Location: [facility];  Service: Urology;  Laterality: Right;   CYSTOSCOPY/RETROGRADE/URETEROSCOPY/STONE EXTRACTION WITH BASKET  yrs ago   DILATION AND CURETTAGE OF UTERUS     HOLMIUM LASER APPLICATION Right [id number]   Procedure: HOLMIUM LASER APPLICATION;  Surgeon: Alexis Frock, MD;  Location: [facility];  Service: Urology;  Laterality: Right;   KNEE SURGERY     right   NEUROPLASTY / TRANSPOSITION ULNAR NERVE AT ELBOW Left 02-23-2000    [facility]   and Left Carpal Tunnel Release   r eyelid cancer     TONSILLECTOMY     TRANSTHORACIC ECHOCARDIOGRAM  02/26/2016   moderate focal basal and mild concentric LVH,  ef 55-60%,  akinesis of the basal-midanteroseptal, inferoseptal and apicalinferior myocardium, study not sufficient to evaluation diastolic funciton due to atrial fib./  trivial PR/  mild TR   UMBILICAL HERNIA REPAIR       OB History   No obstetric history on file.     Family History  Problem Relation Age of Onset   Coronary artery disease Other        positive family hx of   Cancer Other        family hx of   Brain cancer Son    Liver cancer Daughter    Neuropathy Neg Hx     Social History   Tobacco Use   Smoking status: Former Smoker      Years: 5.00    Types: Cigarettes   Smokeless tobacco: Never Used   Tobacco comment: "didn't have the habit that much", just smoked "with the girls"  Substance Use Topics   Alcohol use: No    Alcohol/week: 0.0 standard drinks   Drug use: No    Home Medications Prior to Admission medications   Medication Sig Start Date End Date Taking? Authorizing Provider  DHA-EPA-Flaxseed Oil-Vitamin E (THERA TEARS NUTRITION PO) Take 1 drop by mouth daily as needed (watery eyes).    [provider]  ELIQUIS 5 MG TABS tablet TAKE 1 TABLET BY MOUTH TWICE DAILY 07/28/19   Sherran Needs, NP  fluocinonide (LIDEX) 0.05 % external solution Apply 1 application topically as needed (itching).  06/15/19   [provider]  metoprolol tartrate (LOPRESSOR) 50  MG tablet Take 75mg  by mouth twice daily 10/12/19   Sherran Needs, NP  Multiple Vitamins-Minerals (ICAPS) CAPS Take 1 capsule by mouth 2 (two) times daily.     [provider]  Phenylephrine HCl (NOSE DROPS NA) Place 1 drop into the nose daily as needed (alleriges).    [provider]  triamcinolone (KENALOG) 0.025 % cream Apply 1 application topically as needed.  06/15/19   [provider]  triamcinolone cream (KENALOG) 0.1 % Apply 1 application topically as needed.  06/15/19   [provider]    Allergies    Antihistamines, chlorpheniramine-type; Penicillins; Contrast media [iodinated diagnostic agents]; Sulfonamide derivatives; Atorvastatin; and Pheniramine  Review of Systems   Review of Systems  Constitutional: Positive for fatigue. Negative for chills, diaphoresis and fever.  HENT: Negative for congestion.   Eyes: Negative for visual disturbance.  Respiratory: Negative for cough, chest tightness, shortness of breath and wheezing.   Gastrointestinal: Positive for nausea and vomiting. Negative for abdominal pain, constipation and diarrhea.  Genitourinary: Negative for frequency.   Musculoskeletal: Negative for back pain, neck pain and neck stiffness.  Skin: Positive for rash.  Neurological: Negative for light-headedness and headaches.  Psychiatric/Behavioral: Negative for agitation.  All other systems reviewed and are negative.   Physical Exam Updated Vital Signs BP (!) 150/92 (BP Location: Right Arm)    Pulse (!) 110    Temp (!) 96.7 F (35.9 C) (Axillary)    Resp 16    Ht 5\' 5"  (1.651 m)    Wt 69.1 kg    SpO2 100%    BMI 25.34 kg/m   Physical Exam Vitals and nursing note reviewed.  Constitutional:      General: She is not in acute distress.    Appearance: She is well-developed. She is not ill-appearing, toxic-appearing or diaphoretic.  HENT:     Head: Normocephalic and atraumatic.     Nose: Nose normal. No congestion or rhinorrhea.     Mouth/Throat:     Mouth: Mucous membranes are dry.     Pharynx: No oropharyngeal exudate or posterior oropharyngeal erythema.  Eyes:     Extraocular Movements: Extraocular movements intact.     Conjunctiva/sclera: Conjunctivae normal.     Pupils: Pupils are equal, round, and reactive to light.  Cardiovascular:     Rate and Rhythm: Tachycardia present. Rhythm irregular.     Pulses: Normal pulses.     Heart sounds: No murmur.  Pulmonary:     Effort: Pulmonary effort is normal. No respiratory distress.     Breath sounds: Normal breath sounds. No stridor. No wheezing, rhonchi or rales.  Chest:     Chest wall: No tenderness.  Abdominal:     Palpations: Abdomen is soft.     Tenderness: There is no abdominal tenderness.  Musculoskeletal:        General: Tenderness present.     Cervical back: Neck supple. No tenderness.     Right lower leg: No edema.     Left lower leg: Tenderness present. Edema present.     Comments: Tenderness, erythema, and edema of the left lower leg.  Palpable pulse, sensation, and strength distally.  Some abrasions on the anterior shin from her recent injury several weeks ago.  No knee tenderness  or thigh tenderness.  No hip tenderness.  Skin:    General: Skin is warm and dry.     Capillary Refill: Capillary refill takes less than 2 seconds.     Findings: Rash present.  Neurological:     General: No focal deficit present.     Mental Status: She is alert.  Psychiatric:        Mood and Affect: Mood normal.          ED Results / Procedures / Treatments   Labs (all labs ordered are listed, but only abnormal results are displayed) Labs Reviewed  COMPREHENSIVE METABOLIC PANEL - Abnormal; Notable for the following components:      Result Value   Potassium 5.5 (*)    Glucose, Bld 130 (*)    BUN 30 (*)    Creatinine, Ser 1.70 (*)    Total Protein 6.4 (*)    Total Bilirubin 1.4 (*)    GFR calc non Af Amer 25 (*)    GFR calc Af Amer 29 (*)    All other components within normal limits  CBC WITH DIFFERENTIAL/PLATELET - Abnormal; Notable for the following components:   WBC 11.8 (*)    MCHC 29.4 (*)    RDW 15.9 (*)    Neutro Abs 9.8 (*)    All other components within normal limits  SARS CORONAVIRUS 2 BY RT PCR (HOSPITAL ORDER, [place] [facility])  CULTURE, BLOOD (ROUTINE X 2)  CULTURE, BLOOD (ROUTINE X 2)  LACTIC ACID, PLASMA    EKG None  Radiology DG Tibia/Fibula Left  Result Date: 10/17/2019 CLINICAL DATA:  Pt arrives via [facility] from home for c/o LL leg pain and redness that began last night. Pt recent diagnosed with afib and started on eliquis. Does endorse hitting her lower leg a few weeks ago on a ladder, wound on anterior left shin EXAM: LEFT TIBIA AND FIBULA - 2 VIEW COMPARISON:  None. FINDINGS: No fracture or bone lesion. Skeletal structures are diffusely demineralized. Ankle and knee joints are normally aligned. There is medial knee joint compartment narrowing. Calcification is noted along the posterior superficial lower leg soft tissues, likely venous. No radiopaque foreign body. No soft tissue air. IMPRESSION: 1. No fracture or bone lesion. No  dislocation. No soft tissue foreign body or air. Electronically Signed   By: Lajean Manes M.D.   On: 10/17/2019 12:14   VAS [place] LOWER EXTREMITY VENOUS (DVT) (ONLY [facility])  Result Date: 10/17/2019  Lower Venous DVTStudy Indications: Pain, Swelling, Erythema, and Hit shin on ladder a few weeks ago.  Risk Factors: Atrial fibrillation. Anticoagulation: Eliquis. Comparison Study: Prior study from 06/17/2001 is available for comparison Performing Technologist: Sharion Dove RVS  Examination Guidelines: A complete evaluation includes B-mode imaging, spectral Doppler, color Doppler, and power Doppler as needed of all accessible portions of each vessel. Bilateral testing is considered an integral part of a complete examination. Limited examinations for reoccurring indications may be performed as noted. The reflux portion of the exam is performed with the patient in reverse Trendelenburg.  +-----+---------------+---------+-----------+----------+--------------+  RIGHT Compressibility Phasicity Spontaneity Properties Thrombus Aging  +-----+---------------+---------+-----------+----------+--------------+  CFV   Full            Yes       Yes                                    +-----+---------------+---------+-----------+----------+--------------+   +---------+---------------+---------+-----------+----------+--------------+  LEFT      Compressibility Phasicity Spontaneity Properties Thrombus Aging  +---------+---------------+---------+-----------+----------+--------------+  CFV       Full            Yes  Yes                                    +---------+---------------+---------+-----------+----------+--------------+  SFJ       Full                                                             +---------+---------------+---------+-----------+----------+--------------+  FV Prox   Full                                                             +---------+---------------+---------+-----------+----------+--------------+  FV  Mid    Full                                                             +---------+---------------+---------+-----------+----------+--------------+  FV Distal Full                                                             +---------+---------------+---------+-----------+----------+--------------+  PFV       Full                                                             +---------+---------------+---------+-----------+----------+--------------+  POP       Full            Yes       Yes                                    +---------+---------------+---------+-----------+----------+--------------+  PTV       Full                                                             +---------+---------------+---------+-----------+----------+--------------+  PERO      Full                                                             +---------+---------------+---------+-----------+----------+--------------+     Summary: RIGHT: - No evidence of common femoral vein obstruction.  LEFT: - Findings appear  essentially unchanged compared to previous examination. - There is no evidence of deep vein thrombosis in the lower extremity.  *See table(s) above for measurements and observations.    Preliminary     Procedures Procedures (including critical care time)  CRITICAL CARE Performed by: Gwenyth Allegra Zadiel Leyh Total critical care time: 35 minutes Critical care time was exclusive of separately billable procedures and treating other patients. Critical care was necessary to treat or prevent imminent or life-threatening deterioration. Critical care was time spent personally by me on the following activities: development of treatment plan with patient and/or surrogate as well as nursing, discussions with consultants, evaluation of patient's response to treatment, examination of patient, obtaining history from patient or surrogate, ordering and performing treatments and interventions, ordering and review of laboratory studies,  ordering and review of radiographic studies, pulse oximetry and re-evaluation of patient's condition.   Medications Ordered in ED Medications  sodium chloride flush (NS) 0.9 % injection 3 mL (has no administration in time range)  0.9 %  sodium chloride infusion (has no administration in time range)  ondansetron (ZOFRAN) tablet 4 mg (has no administration in time range)    Or  ondansetron (ZOFRAN) injection 4 mg (has no administration in time range)  acetaminophen (TYLENOL) tablet 650 mg (has no administration in time range)    Or  acetaminophen (TYLENOL) suppository 650 mg (has no administration in time range)  albuterol (PROVENTIL) (2.5 MG/3ML) 0.083% nebulizer solution 2.5 mg (has no administration in time range)  clindamycin (CLEOCIN) IVPB 600 mg (has no administration in time range)  HYDROcodone-acetaminophen (NORCO/VICODIN) 5-325 MG per tablet 1 tablet (1 tablet Oral Given 10/17/19 1602)  sodium chloride 0.9 % bolus 500 mL (0 mLs Intravenous Stopped 10/17/19 1340)  fentaNYL (SUBLIMAZE) injection 50 mcg (50 mcg Intravenous Given 10/17/19 1118)  fentaNYL (SUBLIMAZE) injection 50 mcg (50 mcg Intravenous Given 10/17/19 1332)  clindamycin (CLEOCIN) IVPB 600 mg (0 mg Intravenous Stopped 10/17/19 1546)    ED Course  I have reviewed the triage vital signs and the nursing notes.  Pertinent labs & imaging results that were available during my care of the patient were reviewed by me and considered in my medical decision making (see chart for details).    MDM Rules/Calculators/A&P                      Shelly Obrien is a 86 y.o. female with a past medical history significant for hypertension, hyperlipidemia, CAD, CKD, atrial fibrillation on Eliquis therapy, and kidney stones who presents with left leg pain with erythema as well as dehydration with decreased oral intake due to nausea and vomiting.  Patient reports that today, her left lower leg has been extremely red, warm, and painful.  She thinks  she scraped it several weeks ago but never appeared infected until today.  She actually saw her cardiologist yesterday who did not comment whatsoever on leg erythema, warmth, tenderness, pain, or edema.  She says that she has been having some fatigue symptoms and has had nausea and vomiting decreasing her oral intake recently.  On exam, patient has a very warm erythematous and tender left leg.  There are some skin abrasion seen on it.  Patient reports this is all new today and her leg was normal yesterday when she saw her cardiologist.  Her lungs were clear on exam and chest was nontender.  Abdomen was nontender.  Patient was found to have A. fib on her EKG with no STEMI.  Her  rate was variable during her evaluation from a rate of 110 up to the 130s.  She denies any new chest pain or shortness of breath.  She does report the fatigue, nausea, vomiting has been ongoing for some time.     Clinically I am concerned about the erythema that has started on her leg and has rapidly spread across her leg.  We will get x-ray to look for subcutaneous gas or osteomyelitis.  We will also get ultrasound to make sure this is not a DVT.  Ultrasound negative and x-ray did not show subcutaneous gas or bony abnormality.  Suspect a severe cellulitis.  As her leg was normal yesterday and today is very abnormal, we will start her on IV antibiotics and admit her for further management.  Lactic acid is nonelevated.  Potassium is elevated and creatinine is also elevated.  Will trend.  She has a new leukocytosis.  She will be admitted for cellulitis of the leg.   Final Clinical Impression(s) / ED Diagnoses Final diagnoses:  Cellulitis of left lower extremity    Rx / DC Orders ED Discharge Orders    None      Clinical Impression: 1. Cellulitis of left lower extremity     Disposition: Admit  This note was prepared with assistance of Dragon voice recognition software. Occasional wrong-word or sound-a-like  substitutions may have occurred due to the inherent limitations of voice recognition software.      Yukie Bergeron, Gwenyth Allegra, MD 10/17/19 [phone]

## 2019-10-17 NOTE — H&P (Addendum)
History and Physical    Shelly Obrien [id number] DOB: 24-Jun-1922 DOA: 10/17/2019  Referring MD/NP/PA: Marda Stalker, MD PCP: [name], MD  Patient coming from: home  Chief Complaint: left leg pain  I have personally briefly reviewed patient's old medical records in [place]   HPI: Shelly Obrien is a 86 y.o. female with medical history significant of hypertension, hyperlipidemia, CAD s/p MI, and paroxysmal atrial fibrillation on Eliquis presents with complaints of left leg pain which started yesterday.  She recalls scratching her leg up against ladder approximately 2- 3 weeks ago, but had not had any problems since that time.  Over the last day she had developed an aching pain that was progressively worsening in her leg.  She noted that the leg had turned red in color and was warm to the touch.  Tylenol was not helping symptoms.  Trying to bear weight on the leg seem to worsen the pain.  Notes associated symptoms of fatigue, palpitations over the last 2 weeks for which she has been being followed by cardiology, and difficulty swallowing liquids.  Patient reports being unable to swallow any kind of liquid without it feeling like it gets stuck in her throat and then her spitting it back up.  She has been able to continue to eat solid food without any problem. Due to this she feel dehydrated.  ED Course: Upon admission into the emergency department patient was noted to be febrile, pulse 61-138, blood pressure 130/90 3-1 50/92, and O2 saturations maintained.  Labs significant for WBC 11.8, potassium 5.5, BUN 30, creatinine 1.7, and lactic acid 1.8.  X-ray imaging of the left leg did not show any bony involvement.  Doppler ultrasound of the left lower extremity did not appreciate any signs of a DVT.  Patient was given 500 mL of normal saline IV fluids, 50 mcg of fentanyl, and clindamycin IV.  Review of Systems  Constitutional: Positive for malaise/fatigue. Negative for fever.    HENT: Negative for ear discharge and nosebleeds.   Eyes: Negative for double vision and photophobia.  Respiratory: Negative for cough and shortness of breath.   Cardiovascular: Positive for palpitations and leg swelling. Negative for chest pain.  Gastrointestinal: Positive for nausea and vomiting. Negative for abdominal pain and diarrhea.  Genitourinary: Negative for dysuria and hematuria.  Musculoskeletal: Positive for joint pain and myalgias. Negative for falls.  Skin: Negative for itching.       Positive for skin color change  Neurological: Positive for weakness. Negative for loss of consciousness.  Endo/Heme/Allergies: Bruises/bleeds easily.  Psychiatric/Behavioral: Negative for memory loss and substance abuse.    Past Medical History:  Diagnosis Date  . Aortic ectasia ([place])   . Arm fracture 2013   right, Dr. Sharen Heck  . B12 deficiency   . CAD S/P percutaneous coronary angioplasty    hx MI 04/ 1994  s/p  PCI to LAD  . CKD (chronic kidney disease), stage III   . Complication of anesthesia    hard to wake   . Gallstones   . History of basal cell carcinoma (BCC) excision    right eye area 08/ 2013  . History of kidney stones 01/20/2017  . History of MI (myocardial infarction) 09/1992   s/p  PCI to LAD  . History of squamous cell carcinoma excision    right lower leg 2015 ;  2016  . Hx of colonic polyps   . Hyperlipidemia   . Hypertension   . Macular degeneration   .  Osteoporosis   . PAF (paroxysmal atrial fibrillation) ([place]) dx 02-24-2016   followed by cardiologist-- dr Mamie Nick. [name]  . Pernicious anemia   . PONV (postoperative nausea and vomiting)   . Rib fracture 07/2018   left  . Right ureteral stone   . Wears glasses     Past Surgical History:  Procedure Laterality Date  . ABDOMINAL HYSTERECTOMY    . BREAST EXCISIONAL BIOPSY Left 11/02/2009   fibroadenoma (lumpectomy)  . BROW LIFT Right 04/07/2014   Procedure: REPAIR OF ENTROPION AND TRICHIASIS OF RIGHT LOWER EYE  LID ;  Surgeon: Theodoro Kos, DO;  Location: [facility];  Service: Plastics;  Laterality: Right;  . CARDIOVERSION N/A 07/18/2017   Procedure: CARDIOVERSION;  Surgeon: Skeet Latch, MD;  Location: [place];  Service: Cardiovascular;  Laterality: N/A;  . CARDIOVERSION N/A 07/13/2019   Procedure: CARDIOVERSION;  Surgeon: Josue Hector, MD;  Location[facility] ENDOSCOPY;  Service: Cardiovascular;  Laterality: N/A;  . CARDIOVERSION N/A 09/03/2019   Procedure: CARDIOVERSION;  Surgeon: Buford Dresser, MD;  Location: [facility] ENDOSCOPY;  Service: Cardiovascular;  Laterality: N/A;  . CATARACT EXTRACTION W/ INTRAOCULAR LENS  IMPLANT, BILATERAL    . CHOLECYSTECTOMY N/A 12/06/2017   Procedure: LAPAROSCOPIC CHOLECYSTECTOMY;  Surgeon: Jovita Kussmaul, MD;  Location: [facility];  Service: General;  Laterality: N/A;  . CORONARY ANGIOPLASTY  04/ 1994  dr Lia Foyer   PCI to LAD  . CORONARY ANGIOPLASTY    . CYSTOSCOPY W/ URETERAL STENT PLACEMENT Right 01/20/2017   Procedure: CYSTOSCOPY WITH RIGHT RETROGRADE PYELOGRAM/RIGHT URETERAL STENT PLACEMENT;  Surgeon: Alexis Frock, MD;  Location: [facility];  Service: Urology;  Laterality: Right;  . CYSTOSCOPY WITH RETROGRADE PYELOGRAM, URETEROSCOPY AND STENT PLACEMENT Right 02/15/2017   Procedure: CYSTOSCOPY WITH RETROGRADE PYELOGRAM, URETEROSCOPY, STONE BASKETRY AND STENT REPLACEMENT;  Surgeon: Alexis Frock, MD;  Location: [facility];  Service: Urology;  Laterality: Right;  . CYSTOSCOPY/RETROGRADE/URETEROSCOPY/STONE EXTRACTION WITH BASKET  yrs ago  . DILATION AND CURETTAGE OF UTERUS    . HOLMIUM LASER APPLICATION Right [id number]   Procedure: HOLMIUM LASER APPLICATION;  Surgeon: Alexis Frock, MD;  Location: [facility];  Service: Urology;  Laterality: Right;  . KNEE SURGERY     right  . NEUROPLASTY / TRANSPOSITION ULNAR NERVE AT ELBOW Left 02-23-2000    [facility]   and Left Carpal Tunnel Release  . r eyelid cancer    .  TONSILLECTOMY    . TRANSTHORACIC ECHOCARDIOGRAM  02/26/2016   moderate focal basal and mild concentric LVH,  ef 55-60%,  akinesis of the basal-midanteroseptal, inferoseptal and apicalinferior myocardium, study not sufficient to evaluation diastolic funciton due to atrial fib./  trivial PR/  mild TR  . UMBILICAL HERNIA REPAIR       reports that she has quit smoking. Her smoking use included cigarettes. She quit after 5.00 years of use. She has never used smokeless tobacco. She reports that she does not drink alcohol or use drugs.  Allergies  Allergen Reactions  . Antihistamines, Chlorpheniramine-Type Other (See Comments)    Pt states that she passed out.   [name] Penicillins Anaphylaxis and Other (See Comments)    Has patient had a PCN reaction causing immediate rash, facial/tongue/throat swelling, SOB or lightheadedness with hypotension: Yes Has patient had a PCN reaction causing severe rash involving mucus membranes or skin necrosis: No Has patient had a PCN reaction that required hospitalization No Has patient had a PCN reaction occurring within the last 10 years: No If all of the  above answers are "NO", then may proceed with Cephalosporin use.  . Contrast Media [Iodinated Diagnostic Agents] Hives  . Sulfonamide Derivatives Other (See Comments)    Reaction:  Unknown   . Atorvastatin Other (See Comments)    myalgias  . Pheniramine Other (See Comments)    Pt states that she passed out.     Family History  Problem Relation Age of Onset  . Coronary artery disease Other        positive family hx of  . Cancer Other        family hx of  . Brain cancer Son   . Liver cancer Daughter   . Neuropathy Neg Hx     Prior to Admission medications   Medication Sig Start Date End Date Taking? Authorizing Provider  DHA-EPA-Flaxseed Oil-Vitamin E (THERA TEARS NUTRITION PO) Take 1 drop by mouth daily as needed (watery eyes).   Yes [provider]  ELIQUIS 5 MG TABS tablet TAKE 1 TABLET BY  MOUTH TWICE DAILY Patient taking differently: Take 5 mg by mouth 2 (two) times daily.  07/28/19  Yes Sherran Needs, NP  fluocinonide (LIDEX) 0.05 % external solution Apply 1 application topically as needed (itching).  06/15/19  Yes [provider]  metoprolol tartrate (LOPRESSOR) 50 MG tablet Take 75mg  by mouth twice daily Patient taking differently: Take 50 mg by mouth See admin instructions. 100mg  in the morning , and 50mg  at night. 10/12/19  Yes Sherran Needs, NP  Multiple Vitamins-Minerals (ICAPS) CAPS Take 1 capsule by mouth 2 (two) times daily.    Yes [provider]  Phenylephrine HCl (NOSE DROPS NA) Place 1 drop into the nose daily as needed (alleriges).   Yes [provider]  triamcinolone (KENALOG) 0.025 % cream Apply 1 application topically as needed (itching).  06/15/19  Yes [provider]    Physical Exam:  Constitutional: Elderly female who appears to be in no acute distress at this time Vitals:   10/17/19 1001 10/17/19 1230 10/17/19 1300 10/17/19 1315  BP: (!) 150/92 (!) 138/99 (!) 142/92 (!) 130/93  Pulse: (!) 110 83 61 (!) 138  Resp: 16 16 16 17   Temp:      TempSrc:      SpO2: 100% 99% (!) 86% 96%  Weight:      Height:       Eyes: Lid lag of the right eye. ENMT: Mucous membranes are moist. Posterior pharynx clear of any exudate or lesions. Neck: normal, supple, no masses, no thyromegaly Respiratory: clear to auscultation bilaterally, no wheezing, no crackles. Normal respiratory effort. No accessory muscle use.  Cardiovascular: Irregular irregular, no murmurs / rubs / gallops. No extremity edema. 2+ pedal pulses. No carotid bruits.  Abdomen: no tenderness, no masses palpated. No hepatosplenomegaly. Bowel sounds positive.  Musculoskeletal: no clubbing / cyanosis. No joint deformity upper and lower extremities. Good ROM, no contractures. Normal muscle tone.  Skin: Erythema noted of the left leg without signs of drainage or  wound. Neurologic: CN 2-12 grossly intact. Sensation intact, DTR normal. Strength 5/5 in all 4.  Psychiatric: Normal judgment and insight. Alert and oriented x 3. Normal mood.     Labs on Admission: I have personally reviewed following labs and imaging studies  CBC: Recent Labs  Lab 10/12/19 1439 10/17/19 0801  WBC 6.7 11.8*  NEUTROABS  --  9.8*  HGB 13.2 12.8  HCT 44.4 43.5  MCV 96.3 97.5  PLT 212 [id number]   Basic Metabolic Panel: Recent  Labs  Lab 10/12/19 1439 10/17/19 0801  NA 144 141  K 5.4* 5.5*  CL 106 107  CO2 27 22  GLUCOSE 106* 130*  BUN 26* 30*  CREATININE 1.68* 1.70*  CALCIUM 9.2 9.8   GFR: Estimated Creatinine Clearance: 19.3 mL/min (A) (by C-G formula based on SCr of 1.7 mg/dL (H)). Liver Function Tests: Recent Labs  Lab 10/17/19 0801  AST 31  ALT 28  ALKPHOS 69  BILITOT 1.4*  PROT 6.4*  ALBUMIN 3.7   No results for input(s): LIPASE, AMYLASE in the last 168 hours. No results for input(s): AMMONIA in the last 168 hours. Coagulation Profile: No results for input(s): INR, PROTIME in the last 168 hours. Cardiac Enzymes: No results for input(s): CKTOTAL, CKMB, CKMBINDEX, TROPONINI in the last 168 hours. BNP (last 3 results) No results for input(s): PROBNP in the last 8760 hours. HbA1C: No results for input(s): HGBA1C in the last 72 hours. CBG: No results for input(s): GLUCAP in the last 168 hours. Lipid Profile: No results for input(s): CHOL, HDL, LDLCALC, TRIG, CHOLHDL, LDLDIRECT in the last 72 hours. Thyroid Function Tests: No results for input(s): TSH, T4TOTAL, FREET4, T3FREE, THYROIDAB in the last 72 hours. Anemia Panel: No results for input(s): VITAMINB12, FOLATE, FERRITIN, TIBC, IRON, RETICCTPCT in the last 72 hours. Urine analysis:    Component Value Date/Time   COLORURINE YELLOW 12/14/2017 [place] 12/14/2017 0757   LABSPEC 1.020 04/25/2018 0925   PHURINE 6.5 04/25/2018 0925   GLUCOSEU NEGATIVE 04/25/2018 0925    HGBUR NEGATIVE 04/25/2018 0925   BILIRUBINUR NEGATIVE 04/25/2018 0925   KETONESUR NEGATIVE 04/25/2018 0925   PROTEINUR 30 (A) 04/25/2018 0925   UROBILINOGEN 0.2 04/25/2018 0925   NITRITE NEGATIVE 04/25/2018 0925   LEUKOCYTESUR TRACE (A) 04/25/2018 0925   Sepsis Labs: Recent Results (from the past 240 hour(s))  SARS Coronavirus 2 by RT PCR (hospital order, performed in [facility] hospital lab) Nasopharyngeal Nasopharyngeal Swab     Status: None   Collection Time: 10/17/19 11:20 AM   Specimen: Nasopharyngeal Swab  Result Value Ref Range Status   SARS Coronavirus 2 NEGATIVE NEGATIVE Final    Comment: (NOTE) SARS-CoV-2 target nucleic acids are NOT DETECTED. The SARS-CoV-2 RNA is generally detectable in upper and lower respiratory specimens during the acute phase of infection. The lowest concentration of SARS-CoV-2 viral copies this assay can detect is 250 copies / mL. A negative result does not preclude SARS-CoV-2 infection and should not be used as the sole basis for treatment or other patient management decisions.  A negative result may occur with improper specimen collection / handling, submission of specimen other than nasopharyngeal swab, presence of viral mutation(s) within the areas targeted by this assay, and inadequate number of viral copies (<250 copies / mL). A negative result must be combined with clinical observations, patient history, and epidemiological information. Fact Sheet for Patients:   StrictlyIdeas.no Fact Sheet for Healthcare Providers: BankingDealers.co.za This test is not yet approved or cleared  by the [place] FDA and has been authorized for detection and/or diagnosis of SARS-CoV-2 by FDA under an Emergency Use Authorization (EUA).  This EUA will remain in effect (meaning this test can be used) for the duration of the COVID-19 declaration under Section 564(b)(1) of the Act, 21 U.S.C. section  360bbb-3(b)(1), unless the authorization is terminated or revoked sooner. Performed at [facility], [place] [address], [place], [place] [address]      Radiological Exams on Admission: DG Tibia/Fibula Left  Result Date: 10/17/2019 CLINICAL DATA:  Pt arrives via [facility] from home for c/o LL leg pain and redness that began last night. Pt recent diagnosed with afib and started on eliquis. Does endorse hitting her lower leg a few weeks ago on a ladder, wound on anterior left shin EXAM: LEFT TIBIA AND FIBULA - 2 VIEW COMPARISON:  None. FINDINGS: No fracture or bone lesion. Skeletal structures are diffusely demineralized. Ankle and knee joints are normally aligned. There is medial knee joint compartment narrowing. Calcification is noted along the posterior superficial lower leg soft tissues, likely venous. No radiopaque foreign body. No soft tissue air. IMPRESSION: 1. No fracture or bone lesion. No dislocation. No soft tissue foreign body or air. Electronically Signed   By: Lajean Manes M.D.   On: 10/17/2019 12:14   VAS [place] LOWER EXTREMITY VENOUS (DVT) (ONLY [facility] & [facility])  Result Date: 10/17/2019  Lower Venous DVTStudy Indications: Pain, Swelling, Erythema, and Hit shin on ladder a few weeks ago.  Risk Factors: Atrial fibrillation. Anticoagulation: Eliquis. Comparison Study: Prior study from 06/17/2001 is available for comparison Performing Technologist: Sharion Dove RVS  Examination Guidelines: A complete evaluation includes B-mode imaging, spectral Doppler, color Doppler, and power Doppler as needed of all accessible portions of each vessel. Bilateral testing is considered an integral part of a complete examination. Limited examinations for reoccurring indications may be performed as noted. The reflux portion of the exam is performed with the patient in reverse Trendelenburg.  +-----+---------------+---------+-----------+----------+--------------+  RIGHTCompressibilityPhasicitySpontaneityPropertiesThrombus Aging +-----+---------------+---------+-----------+----------+--------------+ CFV  Full           Yes      Yes                                 +-----+---------------+---------+-----------+----------+--------------+   +---------+---------------+---------+-----------+----------+--------------+ LEFT     CompressibilityPhasicitySpontaneityPropertiesThrombus Aging +---------+---------------+---------+-----------+----------+--------------+ CFV      Full           Yes      Yes                                 +---------+---------------+---------+-----------+----------+--------------+ SFJ      Full                                                        +---------+---------------+---------+-----------+----------+--------------+ FV Prox  Full                                                        +---------+---------------+---------+-----------+----------+--------------+ FV Mid   Full                                                        +---------+---------------+---------+-----------+----------+--------------+ FV DistalFull                                                        +---------+---------------+---------+-----------+----------+--------------+  PFV      Full                                                        +---------+---------------+---------+-----------+----------+--------------+ POP      Full           Yes      Yes                                 +---------+---------------+---------+-----------+----------+--------------+ PTV      Full                                                        +---------+---------------+---------+-----------+----------+--------------+ PERO     Full                                                        +---------+---------------+---------+-----------+----------+--------------+     Summary: RIGHT: - No evidence of common femoral vein  obstruction.  LEFT: - Findings appear essentially unchanged compared to previous examination. - There is no evidence of deep vein thrombosis in the lower extremity.  *See table(s) above for measurements and observations.    Preliminary     EKG: Independently reviewed.  Atrial fibrillation with RVR 107 bpm    Assessment/Plan   Cellulitis of the left leg: Acute.  With acute onset of redness and pain in the left leg.  Doppler ultrasound of lower extremity negative for any signs for the DVT and x-ray imaging did not note any bony involvement.  Patient was started empirically on antibiotics of clindamycin. -Admit to a medical telemetry bed -Check blood cultures -Continue clindamycin -Continue to monitor for clinical improvement  Dysphagia: Acute.  Over the last 2 weeks patient reports inability to swallow liquids.  Denies having any trouble with solid foods however. -Aspiration precautions -Speech therapy consult  Hyperkalemia: Acute. Potassium elevated at 5.5. -IV Fluids of normal saline at 75 ml/hr. -recheck Potassium in a.m  Acute kidney injury superimposed on chronic kidney disease: Patient presented with Cr 1.7 with BUN 30. Baseline Cr had been wnl earlier this year.  -Continue IVFs -Recheck Cr in a.m. -May warrant GI consult for further investigation.   Atrial fibrillation on chronic anticoagulation:CHA2DS2-VASc score =4.  Patient reports compliance with Eliquis. -Continue Eliquis and beta-blocker  Essential hypertension: Home blood pressure medications metoprolol 100 mg every morning and 50 mg nightly. -Continue home regimen   DNR present on admission.  DVT prophylaxis: Eliquis Code Status: DNR Family Communication: attempted to call, but received no answer  Disposition Plan: Possible discharge home in 2 to 3 days Consults called: none Admission status: Inpatient  Norval Morton MD [facility] Pager [phone]   If 7PM-7AM, please contact  night-coverage [email or web address] Password [email or web address]  10/17/2019, 2:15 PM

## 2019-10-18 DIAGNOSIS — I272 Pulmonary hypertension, unspecified: Secondary | ICD-10-CM

## 2019-10-18 DIAGNOSIS — I4821 Permanent atrial fibrillation: Secondary | ICD-10-CM

## 2019-10-18 DIAGNOSIS — R7989 Other specified abnormal findings of blood chemistry: Secondary | ICD-10-CM

## 2019-10-18 DIAGNOSIS — L03116 Cellulitis of left lower limb: Principal | ICD-10-CM

## 2019-10-18 DIAGNOSIS — N179 Acute kidney failure, unspecified: Secondary | ICD-10-CM

## 2019-10-18 DIAGNOSIS — I5022 Chronic systolic (congestive) heart failure: Secondary | ICD-10-CM

## 2019-10-18 LAB — BASIC METABOLIC PANEL
Anion gap: 5 (ref 5–15)
BUN: 27 mg/dL — ABNORMAL HIGH (ref 8–23)
CO2: 25 mmol/L (ref 22–32)
Calcium: 9.3 mg/dL (ref 8.9–10.3)
Chloride: 110 mmol/L (ref 98–111)
Creatinine, Ser: 1.55 mg/dL — ABNORMAL HIGH (ref 0.44–1.00)
GFR calc Af Amer: 33 mL/min — ABNORMAL LOW (ref >60)
GFR calc non Af Amer: 28 mL/min — ABNORMAL LOW (ref >60)
Glucose, Bld: 108 mg/dL — ABNORMAL HIGH (ref 70–99)
Potassium: 5.1 mmol/L (ref 3.5–5.1)
Sodium: 140 mmol/L (ref 135–145)

## 2019-10-18 LAB — CBC
HCT: 39.2 % (ref 36.0–46.0)
Hemoglobin: 11.8 g/dL — ABNORMAL LOW (ref 12.0–15.0)
MCH: 28.9 pg (ref 26.0–34.0)
MCHC: 30.1 g/dL (ref 30.0–36.0)
MCV: 95.8 fL (ref 80.0–100.0)
Platelets: 167 10*3/uL (ref 150–400)
RBC: 4.09 MIL/uL (ref 3.87–5.11)
RDW: 16.3 % — ABNORMAL HIGH (ref 11.5–15.5)
WBC: 9.9 10*3/uL (ref 4.0–10.5)
nRBC: 0 % (ref 0.0–0.2)

## 2019-10-18 MED ORDER — APIXABAN 2.5 MG PO TABS
2.5000 mg | ORAL_TABLET | Freq: Two times a day (BID) | ORAL | Status: DC
  Administered 2019-10-18 – 2019-10-20 (×4): 2.5 mg via ORAL
  Filled 2019-10-18 (×4): qty 1

## 2019-10-18 MED ORDER — SODIUM CHLORIDE 0.9 % IV SOLN
INTRAVENOUS | Status: DC | PRN
  Administered 2019-10-18 – 2019-10-19 (×3): 1000 mL via INTRAVENOUS

## 2019-10-18 NOTE — Progress Notes (Signed)
PROGRESS NOTE  Shelly Obrien [id number] DOB: May 18, 1923   PCP: [name], MD  Patient is from: home  DOA: 10/17/2019 LOS: 1  Brief Narrative / Interim history: 86 year old female with history of CAD, paroxysmal A. fib on Eliquis, HTN and HLD presenting with left leg pain, swelling and redness and admitted for cellulitis, AKI dysphagia..  Patient has dysphagia mostly with liquids not solids.  She was started on IV clindamycin.  SLP consulted.  Subjective: Seen and examined earlier this morning.  No major events overnight of this morning.  Reports improvement in leg swelling and pain.  She denies chest pain but reports dyspnea with exertion.  Reports ambulating about 10 to 20 yards before she stops to catch her breathing. She also noted some weight gain since she was taken off Lasix few weeks ago.  Denies GI or UTI symptoms.  Objective: Vitals:   10/17/19 2054 10/18/19 0107 10/18/19 0514 10/18/19 0744  BP:  112/89 (!) 128/96 120/88  Pulse:  (!) 103 100 93  Resp:  16 14 18   Temp:  97.6 F (36.4 C) 98.2 F (36.8 C) 98.4 F (36.9 C)  TempSrc:  Oral Oral Oral  SpO2: 98% 97% 97% 99%  Weight:   70.3 kg   Height:        Intake/Output Summary (Last 24 hours) at 10/18/2019 1111 Last data filed at 10/18/2019 0300 Gross per 24 hour  Intake 966.74 ml  Output --  Net 966.74 ml   Filed Weights   10/17/19 0957 10/17/19 2045 10/18/19 0514  Weight: 69.1 kg 69.9 kg 70.3 kg    Examination:  GENERAL: No apparent distress.  Nontoxic. HEENT: MMM.  Vision and hearing grossly intact.  NECK: Supple.  No apparent JVD.  RESP:  No IWOB.  Diminished aeration over lung bases CVS: Irregular rhythm.  Normal rate.  Low blood. Heart sounds normal.  ABD/GI/GU: BS+. Abd soft, NTND.  MSK/EXT:  Moves extremities. No apparent deformity. 1+ pedal edema  SKIN: erythema to her knee. Increased warmth to touch.  NEURO: Awake, alert and oriented appropriately.  No apparent focal neuro deficit. PSYCH:  Calm. Normal affect.    Left is on admission. Right is today   Procedures:  None  Microbiology summarized: COVID-19 PCR negative Blood cultures negative  Assessment & Plan: Non-purulent cellulitis: patient reports improvement in her pain but erythema seems to be worse. Hx of ananaphylaxis with pcn. Blood cultures negative. No Hx of diabetes. -Continue IV clindamycin pending evaluation for dysphagia. Oral intake not reliable due to dysphagia -Encourage leg elevation  Chronic Systolic CHF/severe pHTN: Echo on 2/8 with EF of 25-30%, RWMA, diffuse hypokineses, ?DD, RVSP of 60. Was taken of lasix by her cardiologist recently due to AKI. Has left pedal edema likely from cellulitis vs chf exacerbation. She also has AKI.  Was seen by cardiologist the day prior to admission for A. Fib.  -Monitor fluid status -Discontinue IV fluids -Continue home metoprolol but could benefit from succinate than tartrate.  Hx of CAD: no anginal symptoms. -Continue home meds  AKI/Azotemia: Baseline Cr 0.9-1.0 before 07/2019>1.40 (4/1)>1.68(admit)>1.55. BUN 27. Improving -Continue monitoring  -Avoid nephrotoxic meds  Permanent Afib: Heart cardioversion few months ago.  Rate controlled -Continue home metoprolol -Eliquis reduced to 2.5 mg twice daily.  Dysphagia with liquids -Appreciate SLP input-recommended esophagram +/-GI evaluation. -Aspiration precaution           DVT prophylaxis: On Eliquis for A. fib Code Status: DNR/DNI Family Communication: Patient and/or RN. Available if any  question.  Status is: Inpatient  Remains inpatient appropriate because:IV treatments appropriate due to intensity of illness or inability to take PO   Dispo: The patient is from: Home              Anticipated d/c is to: Home              Anticipated d/c date is: 2 days              Patient currently is not medically stable to d/c.        Consultants:  None   Sch Meds:  Scheduled Meds: . apixaban  2.5  mg Oral BID  . metoprolol tartrate  100 mg Oral q morning - 10a   And  . metoprolol tartrate  50 mg Oral QHS  . sodium chloride flush  3 mL Intravenous Q12H   Continuous Infusions: . sodium chloride 75 mL/hr at 10/17/19 2056  . clindamycin (CLEOCIN) IV 600 mg (10/18/19 0555)   PRN Meds:.acetaminophen **OR** acetaminophen, albuterol, fluocinonide, HYDROcodone-acetaminophen, ondansetron **OR** ondansetron (ZOFRAN) IV, polyvinyl alcohol, triamcinolone  Antimicrobials: Anti-infectives (From admission, onward)   Start     Dose/Rate Route Frequency Ordered Stop   10/17/19 2200  clindamycin (CLEOCIN) IVPB 600 mg     600 mg 100 mL/hr over 30 Minutes Intravenous Every 8 hours 10/17/19 1537     10/17/19 1315  clindamycin (CLEOCIN) IVPB 600 mg     600 mg 100 mL/hr over 30 Minutes Intravenous  Once 10/17/19 1311 10/17/19 1546       I have personally reviewed the following labs and images: CBC: Recent Labs  Lab 10/12/19 1439 10/17/19 0801 10/18/19 0319  WBC 6.7 11.8* 9.9  NEUTROABS  --  9.8*  --   HGB 13.2 12.8 11.8*  HCT 44.4 43.5 39.2  MCV 96.3 97.5 95.8  PLT 212 199 167   BMP &GFR Recent Labs  Lab 10/12/19 1439 10/17/19 0801 10/18/19 0319  NA 144 141 140  K 5.4* 5.5* 5.1  CL 106 107 110  CO2 27 22 25   GLUCOSE 106* 130* 108*  BUN 26* 30* 27*  CREATININE 1.68* 1.70* 1.55*  CALCIUM 9.2 9.8 9.3   Estimated Creatinine Clearance: 21.4 mL/min (A) (by C-G formula based on SCr of 1.55 mg/dL (H)). Liver & Pancreas: Recent Labs  Lab 10/17/19 0801  AST 31  ALT 28  ALKPHOS 69  BILITOT 1.4*  PROT 6.4*  ALBUMIN 3.7   No results for input(s): LIPASE, AMYLASE in the last 168 hours. No results for input(s): AMMONIA in the last 168 hours. Diabetic: No results for input(s): HGBA1C in the last 72 hours. No results for input(s): GLUCAP in the last 168 hours. Cardiac Enzymes: No results for input(s): CKTOTAL, CKMB, CKMBINDEX, TROPONINI in the last 168 hours. No results for  input(s): PROBNP in the last 8760 hours. Coagulation Profile: No results for input(s): INR, PROTIME in the last 168 hours. Thyroid Function Tests: No results for input(s): TSH, T4TOTAL, FREET4, T3FREE, THYROIDAB in the last 72 hours. Lipid Profile: No results for input(s): CHOL, HDL, LDLCALC, TRIG, CHOLHDL, LDLDIRECT in the last 72 hours. Anemia Panel: No results for input(s): VITAMINB12, FOLATE, FERRITIN, TIBC, IRON, RETICCTPCT in the last 72 hours. Urine analysis:    Component Value Date/Time   COLORURINE YELLOW 12/14/2017 [place] 12/14/2017 0757   LABSPEC 1.020 04/25/2018 0925   PHURINE 6.5 04/25/2018 [place] 04/25/2018 0925   HGBUR NEGATIVE 04/25/2018 0925   BILIRUBINUR  NEGATIVE 04/25/2018 [place] 04/25/2018 0925   PROTEINUR 30 (A) 04/25/2018 0925   UROBILINOGEN 0.2 04/25/2018 0925   NITRITE NEGATIVE 04/25/2018 0925   LEUKOCYTESUR TRACE (A) 04/25/2018 0925   Sepsis Labs: Invalid input(s): PROCALCITONIN, [place]  Microbiology: Recent Results (from the past 240 hour(s))  SARS Coronavirus 2 by RT PCR (hospital order, performed in [facility] hospital lab) Nasopharyngeal Nasopharyngeal Swab     Status: None   Collection Time: 10/17/19 11:20 AM   Specimen: Nasopharyngeal Swab  Result Value Ref Range Status   SARS Coronavirus 2 NEGATIVE NEGATIVE Final    Comment: (NOTE) SARS-CoV-2 target nucleic acids are NOT DETECTED. The SARS-CoV-2 RNA is generally detectable in upper and lower respiratory specimens during the acute phase of infection. The lowest concentration of SARS-CoV-2 viral copies this assay can detect is 250 copies / mL. A negative result does not preclude SARS-CoV-2 infection and should not be used as the sole basis for treatment or other patient management decisions.  A negative result may occur with improper specimen collection / handling, submission of specimen other than nasopharyngeal swab, presence of  viral mutation(s) within the areas targeted by this assay, and inadequate number of viral copies (<250 copies / mL). A negative result must be combined with clinical observations, patient history, and epidemiological information. Fact Sheet for Patients:   StrictlyIdeas.no Fact Sheet for Healthcare Providers: BankingDealers.co.za This test is not yet approved or cleared  by the [place] FDA and has been authorized for detection and/or diagnosis of SARS-CoV-2 by FDA under an Emergency Use Authorization (EUA).  This EUA will remain in effect (meaning this test can be used) for the duration of the COVID-19 declaration under Section 564(b)(1) of the Act, 21 U.S.C. section 360bbb-3(b)(1), unless the authorization is terminated or revoked sooner. Performed at [facility], [place] [address] [place], [place] [address]   Culture, blood (routine x 2)     Status: None (Preliminary result)   Collection Time: 10/17/19  4:40 PM   Specimen: BLOOD RIGHT HAND  Result Value Ref Range Status   Specimen Description BLOOD RIGHT HAND  Final   Special Requests   Final    BOTTLES DRAWN AEROBIC AND ANAEROBIC Blood Culture results may not be optimal due to an inadequate volume of blood received in culture bottles   Culture   Final    NO GROWTH < 24 HOURS Performed at [facility], [place] [address] [place], [place] [address]    Report Status PENDING  Incomplete  Culture, blood (routine x 2)     Status: None (Preliminary result)   Collection Time: 10/17/19  4:45 PM   Specimen: BLOOD  Result Value Ref Range Status   Specimen Description BLOOD LEFT ANTECUBITAL  Final   Special Requests   Final    BOTTLES DRAWN AEROBIC AND ANAEROBIC Blood Culture adequate volume   Culture   Final    NO GROWTH < 24 HOURS Performed at [facility], [place] [address], [place],  [address]    Report Status PENDING  Incomplete    Radiology Studies: DG  Tibia/Fibula Left  Result Date: 10/17/2019 CLINICAL DATA:  Pt arrives via [facility] from home for c/o LL leg pain and redness that began last night. Pt recent diagnosed with afib and started on eliquis. Does endorse hitting her lower leg a few weeks ago on a ladder, wound on anterior left shin EXAM: LEFT TIBIA AND FIBULA - 2 VIEW COMPARISON:  None. FINDINGS: No fracture or bone lesion. Skeletal structures are diffusely demineralized. Ankle and knee joints are normally aligned. There is medial knee joint compartment narrowing. Calcification is noted along the posterior superficial lower leg soft tissues, likely venous. No radiopaque foreign body. No soft tissue air. IMPRESSION: 1. No fracture or bone lesion. No dislocation. No soft tissue foreign body or air. Electronically Signed   By: Lajean Manes M.D.   On: 10/17/2019 12:14   VAS [place] LOWER EXTREMITY VENOUS (DVT) (ONLY [facility] & [facility])  Result Date: 10/18/2019  Lower Venous DVTStudy Indications: Pain, Swelling, Erythema, and Hit shin on ladder a few weeks ago.  Risk Factors: Atrial fibrillation. Anticoagulation: Eliquis. Comparison Study: Prior study from 06/17/2001 is available for comparison Performing Technologist: Sharion Dove RVS  Examination Guidelines: A complete evaluation includes B-mode imaging, spectral Doppler, color Doppler, and power Doppler as needed of all accessible portions of each vessel. Bilateral testing is considered an integral part of a complete examination. Limited examinations for reoccurring indications may be performed as noted. The reflux portion of the exam is performed with the patient in reverse Trendelenburg.  +-----+---------------+---------+-----------+----------+--------------+ RIGHTCompressibilityPhasicitySpontaneityPropertiesThrombus Aging +-----+---------------+---------+-----------+----------+--------------+ CFV  Full           Yes      Yes                                  +-----+---------------+---------+-----------+----------+--------------+   +---------+---------------+---------+-----------+----------+--------------+ LEFT     CompressibilityPhasicitySpontaneityPropertiesThrombus Aging +---------+---------------+---------+-----------+----------+--------------+ CFV      Full           Yes      Yes                                 +---------+---------------+---------+-----------+----------+--------------+ SFJ      Full                                                        +---------+---------------+---------+-----------+----------+--------------+ FV Prox  Full                                                        +---------+---------------+---------+-----------+----------+--------------+ FV Mid   Full                                                        +---------+---------------+---------+-----------+----------+--------------+ FV DistalFull                                                        +---------+---------------+---------+-----------+----------+--------------+ PFV      Full                                                        +---------+---------------+---------+-----------+----------+--------------+  POP      Full           Yes      Yes                                 +---------+---------------+---------+-----------+----------+--------------+ PTV      Full                                                        +---------+---------------+---------+-----------+----------+--------------+ PERO     Full                                                        +---------+---------------+---------+-----------+----------+--------------+     Summary: RIGHT: - No evidence of common femoral vein obstruction.  LEFT: - Findings appear essentially unchanged compared to previous examination. - There is no evidence of deep vein thrombosis in the lower extremity.  *See table(s) above for measurements and observations.  Electronically signed by Ruta Hinds MD on 10/18/2019 at 8:51:50 AM.    Final      Krystal Delduca T. Packwood  If 7PM-7AM, please contact night-coverage [email or web address] Password [email or web address] 10/18/2019, 11:11 AM

## 2019-10-18 NOTE — Evaluation (Signed)
Clinical/Bedside Swallow Evaluation Patient Details  Name: Shelly Obrien MRN: [id number] Date of Birth: 1922-08-17  Today's Date: 10/18/2019 Time: SLP Start Time (ACUTE ONLY): [id number] SLP Stop Time (ACUTE ONLY): 0946 SLP Time Calculation (min) (ACUTE ONLY): 23 min  Past Medical History:  Past Medical History:  Diagnosis Date  . Aortic ectasia ([place])   . Arm fracture 2013   right, Dr. Sharen Heck  . B12 deficiency   . CAD S/P percutaneous coronary angioplasty    hx MI 04/ 1994  s/p  PCI to LAD  . CKD (chronic kidney disease), stage III   . Complication of anesthesia    hard to wake   . Gallstones   . History of basal cell carcinoma (BCC) excision    right eye area 08/ 2013  . History of kidney stones 01/20/2017  . History of MI (myocardial infarction) 09/1992   s/p  PCI to LAD  . History of squamous cell carcinoma excision    right lower leg 2015 ;  2016  . Hx of colonic polyps   . Hyperlipidemia   . Hypertension   . Macular degeneration   . Osteoporosis   . PAF (paroxysmal atrial fibrillation) ([place]) dx 02-24-2016   followed by cardiologist-- dr Mamie Nick. [name]  . Pernicious anemia   . PONV (postoperative nausea and vomiting)   . Rib fracture 07/2018   left  . Right ureteral stone   . Wears glasses    Past Surgical History:  Past Surgical History:  Procedure Laterality Date  . ABDOMINAL HYSTERECTOMY    . BREAST EXCISIONAL BIOPSY Left 11/02/2009   fibroadenoma (lumpectomy)  . BROW LIFT Right 04/07/2014   Procedure: REPAIR OF ENTROPION AND TRICHIASIS OF RIGHT LOWER EYE LID ;  Surgeon: Theodoro Kos, DO;  Location: [facility];  Service: Plastics;  Laterality: Right;  . CARDIOVERSION N/A 07/18/2017   Procedure: CARDIOVERSION;  Surgeon: Skeet Latch, MD;  Location: [facility];  Service: Cardiovascular;  Laterality: N/A;  . CARDIOVERSION N/A 07/13/2019   Procedure: CARDIOVERSION;  Surgeon: Josue Hector, MD;  Location: [facility] ENDOSCOPY;  Service: Cardiovascular;   Laterality: N/A;  . CARDIOVERSION N/A 09/03/2019   Procedure: CARDIOVERSION;  Surgeon: Buford Dresser, MD;  Location: [facility] ENDOSCOPY;  Service: Cardiovascular;  Laterality: N/A;  . CATARACT EXTRACTION W/ INTRAOCULAR LENS  IMPLANT, BILATERAL    . CHOLECYSTECTOMY N/A 12/06/2017   Procedure: LAPAROSCOPIC CHOLECYSTECTOMY;  Surgeon: Jovita Kussmaul, MD;  [facility];  Service: General;  Laterality: N/A;  . CORONARY ANGIOPLASTY  04/ 1994  dr Lia Foyer   PCI to LAD  . CORONARY ANGIOPLASTY    . CYSTOSCOPY W/ URETERAL STENT PLACEMENT Right 01/20/2017   Procedure: CYSTOSCOPY WITH RIGHT RETROGRADE PYELOGRAM/RIGHT URETERAL STENT PLACEMENT;  Surgeon: Alexis Frock, MD;  [facility];  Service: Urology;  Laterality: Right;  . CYSTOSCOPY WITH RETROGRADE PYELOGRAM, URETEROSCOPY AND STENT PLACEMENT Right 02/15/2017   Procedure: CYSTOSCOPY WITH RETROGRADE PYELOGRAM, URETEROSCOPY, STONE BASKETRY AND STENT REPLACEMENT;  Surgeon: Alexis Frock, MD;  Location: [facility];  Service: Urology;  Laterality: Right;  . CYSTOSCOPY/RETROGRADE/URETEROSCOPY/STONE EXTRACTION WITH BASKET  yrs ago  . DILATION AND CURETTAGE OF UTERUS    . HOLMIUM LASER APPLICATION Right [id number]   Procedure: HOLMIUM LASER APPLICATION;  Surgeon: Alexis Frock, MD;  Location: [facility];  Service: Urology;  Laterality: Right;  . KNEE SURGERY     right  . NEUROPLASTY / TRANSPOSITION ULNAR NERVE AT ELBOW Left 02-23-2000    [facility]   and  Left Carpal Tunnel Release  . r eyelid cancer    . TONSILLECTOMY    . TRANSTHORACIC ECHOCARDIOGRAM  02/26/2016   moderate focal basal and mild concentric LVH,  ef 55-60%,  akinesis of the basal-midanteroseptal, inferoseptal and apicalinferior myocardium, study not sufficient to evaluation diastolic funciton due to atrial fib./  trivial PR/  mild TR  . UMBILICAL HERNIA REPAIR     HPI:  Pt is a 86 y.o. female with medical history significant of hypertension,  hyperlipidemia, CAD s/p MI, and paroxysmal atrial fibrillation on Eliquis who presented with complaints of left leg pain. OPt afebrile at time of eval. Per referrindg MD's note, "Patient reports being unable to swallow any kind of liquid without it feeling like it gets stuck in her throat and then her spitting it back up. She has been able to continue to eat solid food without any problem."     Assessment / Plan / Recommendation Clinical Impression  Pt was seen for bedside swallow evaluation and she denied symptoms of oropharyngeal dysphagia. However, she reported that liquids and inconsistently solids "get stuck" in her chest area and that it all "comes back up". She stated that eructation is occasionally associated with her symptoms and that she has been symptomatic for approximately 3 weeks. Per the pt, she questioned whether it was due to a blood thinner but her cardiologist did not agree. She indicated that she is most symptomatic with water but this occurs with other liquids and, to a lesser extent, with solids as well.   Oral mechanism exam was [facility] and she presented with adequate dentition. No signs or symptoms of oropharyngeal dysphagia were noted during the evaluation. However, she demonstrated symptoms of esophageal dysphagia characterized by reports of "sticking" and regurgitation of all boluses provided during the assessment. It is recommended that the current diet be continued. Further skilled SLP services are not clinically indicated for swallowing. However, considering the pt's symptomatology, assessment of the esophageal phase of the swallow mechanism and/or GI consult is recommended. Pt, Dr. Cyndia Skeeters, and Kirke Shaggy, RN were advised of this recommendation.  SLP Visit Diagnosis: Dysphagia, unspecified (R13.10)(Esophageal dysphagia suspected)    Aspiration Risk  Mild aspiration risk(After deglutition secondary to regurgitation)    Diet Recommendation Regular;Thin liquid   Liquid Administration  via: Straw;Cup Medication Administration: Whole meds with puree Supervision: Patient able to self feed Postural Changes: Seated upright at 90 degrees    Other  Recommendations Recommended Consults: Consider GI evaluation;Consider esophageal assessment Oral Care Recommendations: Oral care BID;Patient independent with oral care   Follow up Recommendations None      Frequency and Duration            Prognosis        Swallow Study   General Date of Onset: 10/17/19 HPI: Pt is a 86 y.o. female with medical history significant of hypertension, hyperlipidemia, CAD s/p MI, and paroxysmal atrial fibrillation on Eliquis who presented with complaints of left leg pain. OPt afebrile at time of eval. Per referrindg MD's note, "Patient reports being unable to swallow any kind of liquid without it feeling like it gets stuck in her throat and then her spitting it back up. She has been able to continue to eat solid food without any problem."   Type of Study: Bedside Swallow Evaluation Previous Swallow Assessment: none Diet Prior to this Study: Regular;Thin liquids Temperature Spikes Noted: No Respiratory Status: Room air History of Recent Intubation: No Behavior/Cognition: Alert;Cooperative;Pleasant mood Oral Cavity Assessment: Within Functional Limits Oral  Care Completed by SLP: No Oral Cavity - Dentition: Adequate natural dentition Vision: Functional for self-feeding Self-Feeding Abilities: Able to feed self Patient Positioning: Upright in bed Baseline Vocal Quality: Normal Volitional Cough: Strong Volitional Swallow: Able to elicit    Oral/Motor/Sensory Function Overall Oral Motor/Sensory Function: Within functional limits   Ice Chips Ice chips: Not tested   Thin Liquid Thin Liquid: Within functional limits Presentation: Straw    Nectar Thick Nectar Thick Liquid: Not tested   Honey Thick Honey Thick Liquid: Not tested   Puree Puree: Within functional limits Presentation: Spoon    Solid     Solid: Within functional limits Presentation: [place] [address] Hardin Negus, [place], [place] Office number [phone] Pager [phone]  Horton Marshall 10/18/2019,10:09 AM

## 2019-10-19 ENCOUNTER — Inpatient Hospital Stay (HOSPITAL_COMMUNITY): Payer: Medicare Other

## 2019-10-19 DIAGNOSIS — I4891 Unspecified atrial fibrillation: Secondary | ICD-10-CM

## 2019-10-19 DIAGNOSIS — R1310 Dysphagia, unspecified: Secondary | ICD-10-CM

## 2019-10-19 DIAGNOSIS — K224 Dyskinesia of esophagus: Secondary | ICD-10-CM

## 2019-10-19 DIAGNOSIS — D72825 Bandemia: Secondary | ICD-10-CM

## 2019-10-19 DIAGNOSIS — N189 Chronic kidney disease, unspecified: Secondary | ICD-10-CM

## 2019-10-19 DIAGNOSIS — I10 Essential (primary) hypertension: Secondary | ICD-10-CM

## 2019-10-19 DIAGNOSIS — Z66 Do not resuscitate: Secondary | ICD-10-CM

## 2019-10-19 LAB — RENAL FUNCTION PANEL
Albumin: 3.1 g/dL — ABNORMAL LOW (ref 3.5–5.0)
Anion gap: 6 (ref 5–15)
BUN: 26 mg/dL — ABNORMAL HIGH (ref 8–23)
CO2: 24 mmol/L (ref 22–32)
Calcium: 9.3 mg/dL (ref 8.9–10.3)
Chloride: 109 mmol/L (ref 98–111)
Creatinine, Ser: 1.38 mg/dL — ABNORMAL HIGH (ref 0.44–1.00)
GFR calc Af Amer: 38 mL/min — ABNORMAL LOW (ref >60)
GFR calc non Af Amer: 32 mL/min — ABNORMAL LOW (ref >60)
Glucose, Bld: 104 mg/dL — ABNORMAL HIGH (ref 70–99)
Phosphorus: 3.2 mg/dL (ref 2.5–4.6)
Potassium: 4.6 mmol/L (ref 3.5–5.1)
Sodium: 139 mmol/L (ref 135–145)

## 2019-10-19 LAB — CBC
HCT: 42.6 % (ref 36.0–46.0)
Hemoglobin: 12.8 g/dL (ref 12.0–15.0)
MCH: 29.1 pg (ref 26.0–34.0)
MCHC: 30 g/dL (ref 30.0–36.0)
MCV: 96.8 fL (ref 80.0–100.0)
Platelets: 190 10*3/uL (ref 150–400)
RBC: 4.4 MIL/uL (ref 3.87–5.11)
RDW: 16.6 % — ABNORMAL HIGH (ref 11.5–15.5)
WBC: 9 10*3/uL (ref 4.0–10.5)
nRBC: 0 % (ref 0.0–0.2)

## 2019-10-19 LAB — MAGNESIUM: Magnesium: 1.9 mg/dL (ref 1.7–2.4)

## 2019-10-19 NOTE — Evaluation (Signed)
Occupational Therapy Evaluation Patient Details Name: Shelly Obrien MRN: [id number] DOB: Feb 11, 1923 Today's Date: 10/19/2019    History of Present Illness 86 yo female with onset of pain and infection on LLE was admitted, noted nonpurulent cellulitis, AKI, and SOB with exertion.  Cleared for fracture with imaging.  PMHx:  CAD, a-fib, HTN, CHF, MI, cardioversion, SOB with exertion, EF 25-30%.     Clinical Impression   Pt PTA: Pt lives alone and reports independence with ADL and mobility. Pt currently standing at sink for grooming tasks; pt performing own pericare at commode with supervisionA. Pt with BLE swelling, but minimal pain reported. Pt minguardA overall for mobility due to BLE pain. Pt appears at functional baseline for OOB ADL.  VSS on RA. Pt does not require continued OT skilled services. OT signing off.    Follow Up Recommendations  No OT follow up;Supervision - Intermittent    Equipment Recommendations  None recommended by OT    Recommendations for Other Services       Precautions / Restrictions Precautions Precautions: Fall Restrictions Weight Bearing Restrictions: No      Mobility Bed Mobility Overal bed mobility: Needs Assistance Bed Mobility: Supine to Sit;Sit to Supine     Supine to sit: Min guard Sit to supine: Min guard   General bed mobility comments: min guard for safety  Transfers Overall transfer level: Needs assistance Equipment used: Rolling walker (2 wheeled);1 person hand held assist Transfers: Sit to/from Stand Sit to Stand: Min guard         General transfer comment: min guard for safety    Balance Overall balance assessment: Needs assistance Sitting-balance support: Feet supported Sitting balance-Leahy Scale: Good     Standing balance support: Bilateral upper extremity supported;During functional activity Standing balance-Leahy Scale: Fair Standing balance comment: unsafe and impulsive at times                            ADL either performed or assessed with clinical judgement   ADL Overall ADL's : At baseline                                       General ADL Comments: Pt standing at sink for grooming tasks; pt performing own pericare at commode with supervisionA. Pt with BLE swelling, but minimal pain reported.     Vision Baseline Vision/History: Wears glasses Wears Glasses: At all times Patient Visual Report: No change from baseline(does not have glasses with her) Vision Assessment?: No apparent visual deficits     Perception     Praxis      Pertinent Vitals/Pain Pain Assessment: Faces Faces Pain Scale: No hurt     Hand Dominance Right   Extremity/Trunk Assessment Upper Extremity Assessment Upper Extremity Assessment: Overall WFL for tasks assessed   Lower Extremity Assessment Lower Extremity Assessment: Generalized weakness   Cervical / Trunk Assessment Cervical / Trunk Assessment: Kyphotic   Communication Communication Communication: No difficulties   Cognition Arousal/Alertness: Awake/alert Behavior During Therapy: WFL for tasks assessed/performed Overall Cognitive Status: Within Functional Limits for tasks assessed                                     General Comments  Pt A/O x4 and very capable for advcanced age. No family present.  Pt on RA and VSS,    Exercises Exercises: Other exercises(RLE strength 4- hip and LLE 3+ throughout)   Shoulder Instructions      Home Living Family/patient expects to be discharged to:: Private residence Living Arrangements: Alone Available Help at Discharge: Family;Available PRN/intermittently Type of Home: House Home Access: Stairs to enter [employer] of Steps: 1   Home Layout: One level     Bathroom Shower/Tub: Occupational psychologist: Handicapped height     Home Equipment: [place] - single point;Shower seat;Grab bars - tub/shower   Additional Comments: cane outdoors and no  AD indoors'      Prior Functioning/Environment Level of Independence: Independent with assistive device(s)        Comments: driving and gets her own groceries        OT Problem List: Decreased activity tolerance      OT Treatment/Interventions:      OT Goals(Current goals can be found in the care plan section) Acute Rehab OT Goals Patient Stated Goal: to get home   OT Frequency:     Barriers to D/C:            Co-evaluation              AM-PAC OT "6 Clicks" Daily Activity     Outcome Measure Help from another person eating meals?: None Help from another person taking care of personal grooming?: None Help from another person toileting, which includes using toliet, bedpan, or urinal?: None Help from another person bathing (including washing, rinsing, drying)?: None Help from another person to put on and taking off regular upper body clothing?: None Help from another person to put on and taking off regular lower body clothing?: None 6 Click Score: 24   End of Session Equipment Utilized During Treatment: Gait belt;Rolling walker Nurse Communication: Mobility status  Activity Tolerance: Patient tolerated treatment well Patient left: in chair;with call bell/phone within reach;with chair alarm set  OT Visit Diagnosis: Unsteadiness on feet (R26.81);Muscle weakness (generalized) (M62.81)                Time: [id number] OT Time Calculation (min): 28 min Charges:  OT General Charges $OT Visit: 1 Visit OT Evaluation $OT Eval Moderate Complexity: 1 Mod OT Treatments $Self Care/Home Management : 8-22 mins  Jefferey Pica, OTR/L Acute Rehabilitation Services Pager: [phone] Office: [phone]   [name] C 10/19/2019, 5:07 PM

## 2019-10-19 NOTE — Progress Notes (Addendum)
PROGRESS NOTE  Shelly Obrien [id number] DOB: 1922/07/01   PCP: [name], MD  Patient is from: home  DOA: 10/17/2019 LOS: 2  Brief Narrative / Interim history: 86 year old female with history of CAD, paroxysmal A. fib on Eliquis, HTN and HLD presenting with left leg pain, swelling and redness and admitted for cellulitis, AKI dysphagia..  Patient has dysphagia mostly with liquids not solids.  She was started on IV clindamycin.  SLP consulted and recommended esophagram.  Subjective: Seen and examined earlier this morning.  No major events overnight or this morning.  Reports improvement in her left leg pain.  Noted more swelling in her left foot.  She denies chest pain, dyspnea, GI or UTI symptoms.   Objective: Vitals:   10/18/19 2021 10/19/19 0300 10/19/19 0411 10/19/19 0916  BP: (!) 135/108  (!) 126/93 124/84  Pulse: 78  90 67  Resp: 20  18 15   Temp: 98 F (36.7 C)  98.3 F (36.8 C)   TempSrc: Oral  Oral   SpO2: 97%  97%   Weight:  70.1 kg    Height:        Intake/Output Summary (Last 24 hours) at 10/19/2019 1125 Last data filed at 10/19/2019 0427 Gross per 24 hour  Intake 622.31 ml  Output 400 ml  Net 222.31 ml   Filed Weights   10/17/19 2045 10/18/19 0514 10/19/19 0300  Weight: 69.9 kg 70.3 kg 70.1 kg    Examination:  GENERAL: No apparent distress.  Nontoxic. HEENT: MMM.  Vision and hearing grossly intact.  NECK: Supple.  No apparent JVD.  RESP: 97% on RA.  No IWOB.  Fair aeration bilaterally. CVS: Irregular rhythm.  Normal rate.  Heart sounds normal.  ABD/GI/GU: BS+. Abd soft, NTND.  MSK/EXT:  Moves extremities. No apparent deformity.  1+ pitting edema in LLE. SKIN: Erythema over left LLE as in picture below NEURO: Awake, alert and oriented appropriately.  No apparent focal neuro deficit. PSYCH: Calm. Normal affect.    Left to right from admission to date   Procedures:  None  Microbiology summarized: COVID-19 PCR negative Blood cultures  negative  Assessment & Plan: Non-purulent cellulitis: Improving.  LLE [place] negative for DVT.  X-ray without acute finding.  Hx of ananaphylaxis with pcn. Blood cultures negative. No Hx of diabetes. -Continue IV clindamycin pending evaluation for dysphagia. Oral intake not reliable due to dysphagia -Encourage leg elevation  Chronic Systolic CHF/severe pHTN: Echo on 2/8 with EF of 25-30%, RWMA, diffuse hypokineses, ?DD, RVSP of 60. Was taken of lasix by her cardiologist recently due to AKI. Has left pedal edema likely from cellulitis vs chf exacerbation. She also has AKI.  Was seen by cardiologist the day prior to admission for A. Fib. I&O incomplete.  -Closely monitor fluid status -Continue home metoprolol but could benefit from succinate than tartrate.  Hx of CAD: no anginal symptoms. -Continue home meds  AKI/Azotemia: Baseline Cr 0.9-1.0 before 07/2019>1.40 (4/1)>1.68(admit)>1.55> 1.38. BUN 27> 26. Improving -Continue monitoring  -Avoid nephrotoxic meds  Permanent Afib: Heart cardioversion few months ago.  Rate controlled -Continue home metoprolol -Eliquis reduced to 2.5 mg twice daily.  Dysphagia with liquids -Appreciate SLP input-recommended esophagram +/-GI evaluation. -Esophagram concerning for esophageal dysmotility -GI consulted -Aspiration precaution   Addendum -Consulted [facility], Dr. Watt Climes. Patient will be seen 5/18  Mild leukocytosis: Resolved.           DVT prophylaxis: On Eliquis for A. fib Code Status: DNR/DNI Family Communication: Patient and/or RN. Available if  any question.  Status is: Inpatient  Remains inpatient appropriate because:IV treatments appropriate due to intensity of illness or inability to take PO due to dysphagia with liquids   Dispo: The patient is from: Home              Anticipated d/c is to: Home              Anticipated d/c date is: 2 days              Patient currently is not medically stable to d/c.        Consultants:   GI   Sch Meds:  Scheduled Meds: . apixaban  2.5 mg Oral BID  . metoprolol tartrate  100 mg Oral q morning - 10a   And  . metoprolol tartrate  50 mg Oral QHS  . sodium chloride flush  3 mL Intravenous Q12H   Continuous Infusions: . sodium chloride 1,000 mL (10/19/19 0529)  . clindamycin (CLEOCIN) IV 600 mg (10/19/19 0530)   PRN Meds:.sodium chloride, acetaminophen **OR** acetaminophen, albuterol, fluocinonide, HYDROcodone-acetaminophen, ondansetron **OR** ondansetron (ZOFRAN) IV, polyvinyl alcohol, triamcinolone  Antimicrobials: Anti-infectives (From admission, onward)   Start     Dose/Rate Route Frequency Ordered Stop   10/17/19 2200  clindamycin (CLEOCIN) IVPB 600 mg     600 mg 100 mL/hr over 30 Minutes Intravenous Every 8 hours 10/17/19 1537     10/17/19 1315  clindamycin (CLEOCIN) IVPB 600 mg     600 mg 100 mL/hr over 30 Minutes Intravenous  Once 10/17/19 1311 10/17/19 1546       I have personally reviewed the following labs and images: CBC: Recent Labs  Lab 10/12/19 1439 10/17/19 0801 10/18/19 0319 10/19/19 0431  WBC 6.7 11.8* 9.9 9.0  NEUTROABS  --  9.8*  --   --   HGB 13.2 12.8 11.8* 12.8  HCT 44.4 43.5 39.2 42.6  MCV 96.3 97.5 95.8 96.8  PLT 212 199 167 190   BMP &GFR Recent Labs  Lab 10/12/19 1439 10/17/19 0801 10/18/19 0319 10/19/19 0431  NA 144 141 140 139  K 5.4* 5.5* 5.1 4.6  CL 106 107 110 109  CO2 27 22 25 24   GLUCOSE 106* 130* 108* 104*  BUN 26* 30* 27* 26*  CREATININE 1.68* 1.70* 1.55* 1.38*  CALCIUM 9.2 9.8 9.3 9.3  MG  --   --   --  1.9  PHOS  --   --   --  3.2   Estimated Creatinine Clearance: 23.9 mL/min (A) (by C-G formula based on SCr of 1.38 mg/dL (H)). Liver & Pancreas: Recent Labs  Lab 10/17/19 0801 10/19/19 0431  AST 31  --   ALT 28  --   ALKPHOS 69  --   BILITOT 1.4*  --   PROT 6.4*  --   ALBUMIN 3.7 3.1*   No results for input(s): LIPASE, AMYLASE in the last 168 hours. No results for input(s): AMMONIA in the  last 168 hours. Diabetic: No results for input(s): HGBA1C in the last 72 hours. No results for input(s): GLUCAP in the last 168 hours. Cardiac Enzymes: No results for input(s): CKTOTAL, CKMB, CKMBINDEX, TROPONINI in the last 168 hours. No results for input(s): PROBNP in the last 8760 hours. Coagulation Profile: No results for input(s): INR, PROTIME in the last 168 hours. Thyroid Function Tests: No results for input(s): TSH, T4TOTAL, FREET4, T3FREE, THYROIDAB in the last 72 hours. Lipid Profile: No results for input(s): CHOL, HDL, LDLCALC, TRIG, CHOLHDL, LDLDIRECT  in the last 72 hours. Anemia Panel: No results for input(s): VITAMINB12, FOLATE, FERRITIN, TIBC, IRON, RETICCTPCT in the last 72 hours. Urine analysis:    Component Value Date/Time   COLORURINE YELLOW 12/14/2017 [place] 12/14/2017 0757   LABSPEC 1.020 04/25/2018 0925   PHURINE 6.5 04/25/2018 0925   GLUCOSEU NEGATIVE 04/25/2018 0925   HGBUR NEGATIVE 04/25/2018 0925   BILIRUBINUR NEGATIVE 04/25/2018 0925   KETONESUR NEGATIVE 04/25/2018 0925   PROTEINUR 30 (A) 04/25/2018 0925   UROBILINOGEN 0.2 04/25/2018 0925   NITRITE NEGATIVE 04/25/2018 0925   LEUKOCYTESUR TRACE (A) 04/25/2018 0925   Sepsis Labs: Invalid input(s): PROCALCITONIN, [place]  Microbiology: Recent Results (from the past 240 hour(s))  SARS Coronavirus 2 by RT PCR (hospital order, performed in [facility] hospital lab) Nasopharyngeal Nasopharyngeal Swab     Status: None   Collection Time: 10/17/19 11:20 AM   Specimen: Nasopharyngeal Swab  Result Value Ref Range Status   SARS Coronavirus 2 NEGATIVE NEGATIVE Final    Comment: (NOTE) SARS-CoV-2 target nucleic acids are NOT DETECTED. The SARS-CoV-2 RNA is generally detectable in upper and lower respiratory specimens during the acute phase of infection. The lowest concentration of SARS-CoV-2 viral copies this assay can detect is 250 copies / mL. A negative result does not preclude  SARS-CoV-2 infection and should not be used as the sole basis for treatment or other patient management decisions.  A negative result may occur with improper specimen collection / handling, submission of specimen other than nasopharyngeal swab, presence of viral mutation(s) within the areas targeted by this assay, and inadequate number of viral copies (<250 copies / mL). A negative result must be combined with clinical observations, patient history, and epidemiological information. Fact Sheet for Patients:   StrictlyIdeas.no Fact Sheet for Healthcare Providers: BankingDealers.co.za This test is not yet approved or cleared  by the [place] FDA and has been authorized for detection and/or diagnosis of SARS-CoV-2 by FDA under an Emergency Use Authorization (EUA).  This EUA will remain in effect (meaning this test can be used) for the duration of the COVID-19 declaration under Section 564(b)(1) of the Act, 21 U.S.C. section 360bbb-3(b)(1), unless the authorization is terminated or revoked sooner. Performed at [facility], [place] [address], [place], [place] [address]   Culture, blood (routine x 2)     Status: None (Preliminary result)   Collection Time: 10/17/19  4:40 PM   Specimen: BLOOD RIGHT HAND  Result Value Ref Range Status   Specimen Description BLOOD RIGHT HAND  Final   Special Requests   Final    BOTTLES DRAWN AEROBIC AND ANAEROBIC Blood Culture results may not be optimal due to an inadequate volume of blood received in culture bottles   Culture   Final    NO GROWTH 2 DAYS Performed at [facility], [place] [address], [place], [place] [address]    Report Status PENDING  Incomplete  Culture, blood (routine x 2)     Status: None (Preliminary result)   Collection Time: 10/17/19  4:45 PM   Specimen: BLOOD  Result Value Ref Range Status   Specimen Description BLOOD LEFT ANTECUBITAL  Final   Special Requests   Final     BOTTLES DRAWN AEROBIC AND ANAEROBIC Blood Culture adequate volume   Culture   Final    NO GROWTH 2 DAYS Performed at [facility], [place] [address] [place], [place] [address]    Report Status PENDING  Incomplete  Radiology Studies: DG ESOPHAGUS W SINGLE CM (SOL OR THIN BA)  Result Date: 10/19/2019 CLINICAL DATA:  Recurrent reflux of liquids into the mouth. EXAM: ESOPHOGRAM/BARIUM SWALLOW TECHNIQUE: Single contrast examination was performed using  thin barium. FLUOROSCOPY TIME:  Fluoroscopy Time: 1 minutes and 48 seconds of low-dose pulsed fluoroscopy Radiation Exposure Index (if provided by the fluoroscopic device): 15.4 mGy Number of Acquired Spot Images: 0 COMPARISON:  Radiographs 07/12/2019. Chest CT 09/25/2015. FINDINGS: Study was performed in the supine, semi erect and right lateral decubitus positions. The patient swallowed the barium without difficulty. No pharyngeal abnormalities or laryngeal penetration observed. There is esophageal motility with a decreased primary stripping wave. In all positions, there was retained barium within the esophageal lumen. No stricture, mass, ulceration or significant hiatal hernia was observed. Several times during the examination, the barium and water refluxed from the esophagus into the patient's mouth. No definite gastroesophageal reflux was seen. At the conclusion of the study, a 13 mm barium tablet was administered. This passed into the distal esophagus, although in the positions evaluated did not pass through the gastroesophageal junction. This appears to be due to the esophageal dysmotility and inability to stand the patient erect rather than a fixed esophageal stricture. IMPRESSION: 1. Esophageal dysmotility with retained barium within the esophageal lumen. Multiple episodes of reflux of the esophageal contents into the patient's mouth without definite gastroesophageal reflux. 2. No stricture, mass, or significant hiatal hernia. 3. The 13 mm barium  tablet passed into the distal esophagus but not the stomach, likely due to the esophageal dysmotility. Electronically Signed   By: Richardean Sale M.D.   On: 10/19/2019 10:46     Max Nuno T. Monroe  If 7PM-7AM, please contact night-coverage [email or web address] Password [facility] 10/19/2019, 11:25 AM

## 2019-10-19 NOTE — Evaluation (Signed)
Physical Therapy Evaluation Patient Details Name: Shelly Obrien MRN: [id number] DOB: 06/03/1923 Today's Date: 10/19/2019   History of Present Illness  86 yo female with onset of pain and infection on LLE was admitted, noted nonpurulent cellulitis, AKI, and SOB with exertion.  Cleared for fracture wiht imaging.  PMHx:  CAD, a-fib, HTN, CHF, MI, cardioversion, SOB with exertion, EF 25-30%.    Clinical Impression  Pt was seen for mobility of transfers, gait on RW and with cues for balance and safety on walker.  While this device is new to pt, she is unsafe at times and will need initially some supervision for gait at home.  If this is not availiable to her will need to go to a short rehab stay with focus on balance and safety with LRAD.  Follow acutely to make progress in all these areas.    Follow Up Recommendations Home health PT;Supervision for mobility/OOB    Equipment Recommendations  Rolling walker with 5" wheels    Recommendations for Other Services       Precautions / Restrictions Precautions Precautions: Fall Restrictions Weight Bearing Restrictions: No      Mobility  Bed Mobility Overal bed mobility: Needs Assistance Bed Mobility: Supine to Sit;Sit to Supine     Supine to sit: Min guard Sit to supine: Min guard   General bed mobility comments: min guard for safety  Transfers Overall transfer level: Needs assistance Equipment used: Rolling walker (2 wheeled);1 person hand held assist Transfers: Sit to/from Stand Sit to Stand: Min guard         General transfer comment: min guard for safety  Ambulation/Gait Ambulation/Gait assistance: Min guard Gait Distance (Feet): 150 Feet Assistive device: Rolling walker (2 wheeled);1 person hand held assist Gait Pattern/deviations: Step-through pattern;Decreased stride length;Wide base of support Gait velocity: rapid pace Gait velocity interpretation: <1.31 ft/sec, indicative of household ambulator    Stairs             Wheelchair Mobility    Modified Rankin (Stroke Patients Only)       Balance Overall balance assessment: Needs assistance Sitting-balance support: Feet supported Sitting balance-Leahy Scale: Good     Standing balance support: Bilateral upper extremity supported;During functional activity Standing balance-Leahy Scale: Fair Standing balance comment: unsafe and impulsive at times                             Pertinent Vitals/Pain Pain Assessment: Faces Faces Pain Scale: No hurt    Home Living Family/patient expects to be discharged to:: Private residence Living Arrangements: Alone Available Help at Discharge: Family;Available PRN/intermittently Type of Home: House Home Access: Stairs to enter   Entrance Stairs-Number of Steps: 1 Home Layout: One level Home Equipment: Cane - single point;Shower seat;Grab bars - tub/shower Additional Comments: cane outdoors and no AD indoors'    Prior Function Level of Independence: Independent with assistive device(s)         Comments: driving and gets her own groceries     Hand Dominance   Dominant Hand: Right    Extremity/Trunk Assessment   Upper Extremity Assessment Upper Extremity Assessment: Overall WFL for tasks assessed    Lower Extremity Assessment Lower Extremity Assessment: Generalized weakness    Cervical / Trunk Assessment Cervical / Trunk Assessment: Kyphotic  Communication   Communication: No difficulties  Cognition Arousal/Alertness: Awake/alert Behavior During Therapy: WFL for tasks assessed/performed Overall Cognitive Status: Within Functional Limits for tasks assessed  General Comments General comments (skin integrity, edema, etc.): pt was seen for mobility on RW, had unsafe turns and set up to sit on bed.  Talked with her but did not change her behavior completely.    Exercises     Assessment/Plan    PT Assessment Patient  needs continued PT services  PT Problem List Decreased strength;Decreased range of motion;Decreased activity tolerance;Decreased balance;Decreased mobility;Decreased coordination;Decreased knowledge of use of DME;Decreased safety awareness;Cardiopulmonary status limiting activity       PT Treatment Interventions DME instruction;Gait training;Functional mobility training;Therapeutic activities;Therapeutic exercise;Neuromuscular re-education;Balance training;Patient/family education    PT Goals (Current goals can be found in the Care Plan section)  Acute Rehab PT Goals Patient Stated Goal: to get home and feel better, to heal up LLE PT Goal Formulation: With patient Time For Goal Achievement: 11/02/19 Potential to Achieve Goals: Good    Frequency Min 3X/week   Barriers to discharge Decreased caregiver support home in level environment but alone    Co-evaluation               AM-PAC PT "6 Clicks" Mobility  Outcome Measure Help needed turning from your back to your side while in a flat bed without using bedrails?: None Help needed moving from lying on your back to sitting on the side of a flat bed without using bedrails?: A Little Help needed moving to and from a bed to a chair (including a wheelchair)?: A Little Help needed standing up from a chair using your arms (e.g., wheelchair or bedside chair)?: A Little Help needed to walk in hospital room?: A Little Help needed climbing 3-5 steps with a railing? : A Lot 6 Click Score: 18    End of Session Equipment Utilized During Treatment: Gait belt Activity Tolerance: Patient limited by fatigue;Treatment limited secondary to medical complications (Comment) Patient left: in bed;with call bell/phone within reach;with bed alarm set Nurse Communication: Mobility status PT Visit Diagnosis: Unsteadiness on feet (R26.81);Muscle weakness (generalized) (M62.81);Difficulty in walking, not elsewhere classified (R26.2)    Time: [id number] PT  Time Calculation (min) (ACUTE ONLY): 19 min   Charges:   PT Evaluation $PT Eval Moderate Complexity: 1 Mod         Ramond Dial 10/19/2019, 4:23 PM  Mee Hives, PT MS Acute Rehab Dept. Number: [place]

## 2019-10-19 NOTE — Plan of Care (Signed)

## 2019-10-19 NOTE — Progress Notes (Signed)
PT Cancellation Note  Patient Details Name: Shelly Obrien MRN: [id number] DOB: 1922/12/21   Cancelled Treatment:    Reason Eval/Treat Not Completed: Patient at procedure or test/unavailable.  Pt will be re-attempted at another time.   Ramond Dial 10/19/2019, 11:19 AM   Mee Hives, PT MS Acute Rehab Dept. Number: [place] and [place]

## 2019-10-20 DIAGNOSIS — R5381 Other malaise: Secondary | ICD-10-CM

## 2019-10-20 LAB — MAGNESIUM: Magnesium: 1.9 mg/dL (ref 1.7–2.4)

## 2019-10-20 LAB — RENAL FUNCTION PANEL
Albumin: 2.6 g/dL — ABNORMAL LOW (ref 3.5–5.0)
Anion gap: 9 (ref 5–15)
BUN: 25 mg/dL — ABNORMAL HIGH (ref 8–23)
CO2: 23 mmol/L (ref 22–32)
Calcium: 8.8 mg/dL — ABNORMAL LOW (ref 8.9–10.3)
Chloride: 108 mmol/L (ref 98–111)
Creatinine, Ser: 1.29 mg/dL — ABNORMAL HIGH (ref 0.44–1.00)
GFR calc Af Amer: 41 mL/min — ABNORMAL LOW (ref >60)
GFR calc non Af Amer: 35 mL/min — ABNORMAL LOW (ref >60)
Glucose, Bld: 91 mg/dL (ref 70–99)
Phosphorus: 3.2 mg/dL (ref 2.5–4.6)
Potassium: 4.7 mmol/L (ref 3.5–5.1)
Sodium: 140 mmol/L (ref 135–145)

## 2019-10-20 LAB — CBC
HCT: 37.3 % (ref 36.0–46.0)
Hemoglobin: 11.3 g/dL — ABNORMAL LOW (ref 12.0–15.0)
MCH: 28.8 pg (ref 26.0–34.0)
MCHC: 30.3 g/dL (ref 30.0–36.0)
MCV: 95.2 fL (ref 80.0–100.0)
Platelets: 168 10*3/uL (ref 150–400)
RBC: 3.92 MIL/uL (ref 3.87–5.11)
RDW: 16.3 % — ABNORMAL HIGH (ref 11.5–15.5)
WBC: 6.1 10*3/uL (ref 4.0–10.5)
nRBC: 0 % (ref 0.0–0.2)

## 2019-10-20 MED ORDER — PROBIOTIC ACIDOPHILUS PO TABS: 1.0000 | ORAL_TABLET | Freq: Two times a day (BID) | ORAL | 0 refills | Status: DC

## 2019-10-20 MED ORDER — ACETAMINOPHEN 500 MG PO TABS: 1000.0000 mg | ORAL_TABLET | Freq: Three times a day (TID) | ORAL | 1 refills | Status: AC

## 2019-10-20 MED ORDER — CLINDAMYCIN HCL 300 MG PO CAPS: 300.0000 mg | ORAL_CAPSULE | Freq: Three times a day (TID) | ORAL | 0 refills | Status: AC

## 2019-10-20 MED ORDER — CLINDAMYCIN HCL 300 MG PO CAPS: 600.0000 mg | ORAL_CAPSULE | Freq: Three times a day (TID) | ORAL | 0 refills | Status: DC

## 2019-10-20 MED FILL — CLINDAMYCIN HCL 300 MG CAPS: 300 | 5 days supply | Qty: 15 | Fill #0

## 2019-10-20 NOTE — Consult Note (Signed)
Subjective:   HPI  86 year old female who we are asked to see in regards to dysphagia which started about 3 weeks ago.  She states that she has no trouble with solid foods or foods such as applesauce but when she drinks liquids it feels like it comes back up.  She had a barium swallow which showed esophageal dysmotility, this was reviewed.  No stricture.  She denies abdominal pain, chest pain, or heartburn.  She seems to think that her symptoms are getting a little better with time.     Past Medical History:  Diagnosis Date  . Aortic ectasia ([place])   . Arm fracture 2013   right, Dr. Sharen Heck  . B12 deficiency   . CAD S/P percutaneous coronary angioplasty    hx MI 04/ 1994  s/p  PCI to LAD  . CKD (chronic kidney disease), stage III   . Complication of anesthesia    hard to wake   . Gallstones   . History of basal cell carcinoma (BCC) excision    right eye area 08/ 2013  . History of kidney stones 01/20/2017  . History of MI (myocardial infarction) 09/1992   s/p  PCI to LAD  . History of squamous cell carcinoma excision    right lower leg 2015 ;  2016  . Hx of colonic polyps   . Hyperlipidemia   . Hypertension   . Macular degeneration   . Osteoporosis   . PAF (paroxysmal atrial fibrillation) ([place]) dx 02-24-2016   followed by cardiologist-- dr Mamie Nick. [name]  . Pernicious anemia   . PONV (postoperative nausea and vomiting)   . Rib fracture 07/2018   left  . Right ureteral stone   . Wears glasses    Past Surgical History:  Procedure Laterality Date  . ABDOMINAL HYSTERECTOMY    . BREAST EXCISIONAL BIOPSY Left 11/02/2009   fibroadenoma (lumpectomy)  . BROW LIFT Right 04/07/2014   Procedure: REPAIR OF ENTROPION AND TRICHIASIS OF RIGHT LOWER EYE LID ;  Surgeon: Theodoro Kos, DO;  Location: [facility];  Service: Plastics;  Laterality: Right;  . CARDIOVERSION N/A 07/18/2017   Procedure: CARDIOVERSION;  Surgeon: Skeet Latch, MD;  Location: [place];  Service:  Cardiovascular;  Laterality: N/A;  . CARDIOVERSION N/A 07/13/2019   Procedure: CARDIOVERSION;  Surgeon: Josue Hector, MD;  Location: [facility] ENDOSCOPY;  Service: Cardiovascular;  Laterality: N/A;  . CARDIOVERSION N/A 09/03/2019   Procedure: CARDIOVERSION;  Surgeon: Buford Dresser, MD;  Location: [facility] ENDOSCOPY;  Service: Cardiovascular;  Laterality: N/A;  . CATARACT EXTRACTION W/ INTRAOCULAR LENS  IMPLANT, BILATERAL    . CHOLECYSTECTOMY N/A 12/06/2017   Procedure: LAPAROSCOPIC CHOLECYSTECTOMY;  Surgeon: Jovita Kussmaul, MD;  Location: [facility];  Service: General;  Laterality: N/A;  . CORONARY ANGIOPLASTY  04/ 1994  dr Lia Foyer   PCI to LAD  . CORONARY ANGIOPLASTY    . CYSTOSCOPY W/ URETERAL STENT PLACEMENT Right 01/20/2017   Procedure: CYSTOSCOPY WITH RIGHT RETROGRADE PYELOGRAM/RIGHT URETERAL STENT PLACEMENT;  Surgeon: Alexis Frock, MD;  Location: [facility];  Service: Urology;  Laterality: Right;  . CYSTOSCOPY WITH RETROGRADE PYELOGRAM, URETEROSCOPY AND STENT PLACEMENT Right 02/15/2017   Procedure: CYSTOSCOPY WITH RETROGRADE PYELOGRAM, URETEROSCOPY, STONE BASKETRY AND STENT REPLACEMENT;  Surgeon: Alexis Frock, MD;  Location: [facility];  Service: Urology;  Laterality: Right;  . CYSTOSCOPY/RETROGRADE/URETEROSCOPY/STONE EXTRACTION WITH BASKET  yrs ago  . DILATION AND CURETTAGE OF UTERUS    . HOLMIUM LASER APPLICATION Right [id number]   Procedure: HOLMIUM LASER  APPLICATION;  Surgeon: Alexis Frock, MD;  Location: [facility];  Service: Urology;  Laterality: Right;  . KNEE SURGERY     right  . NEUROPLASTY / TRANSPOSITION ULNAR NERVE AT ELBOW Left 02-23-2000    [facility]   and Left Carpal Tunnel Release  . r eyelid cancer    . TONSILLECTOMY    . TRANSTHORACIC ECHOCARDIOGRAM  02/26/2016   moderate focal basal and mild concentric LVH,  ef 55-60%,  akinesis of the basal-midanteroseptal, inferoseptal and apicalinferior myocardium, study not sufficient to evaluation diastolic  funciton due to atrial fib./  trivial PR/  mild TR  . UMBILICAL HERNIA REPAIR     Social History   Socioeconomic History  . Marital status: Widowed    Spouse name: Not on file  . Number of children: 2  . Years of education: Not on file  . Highest education level: Not on file  Occupational History  . Occupation: Retired  Tobacco Use  . Smoking status: Former Smoker    Years: 5.00    Types: Cigarettes  . Smokeless tobacco: Never Used  . Tobacco comment: "didn't have the habit that much", just smoked "with the girls"  Substance and Sexual Activity  . Alcohol use: No    Alcohol/week: 0.0 standard drinks  . Drug use: No  . Sexual activity: Not on file  Other Topics Concern  . Not on file  Social History Narrative   Lives at home alone   Right handed   Social Determinants of Health   Financial Resource Strain:   . Difficulty of Paying Living Expenses:   Food Insecurity:   . Worried About Charity fundraiser in the Last Year:   . Arboriculturist in the Last Year:   Transportation Needs:   . Film/video editor (Medical):   [name] Lack of Transportation (Non-Medical):   Physical Activity:   . Days of Exercise per Week:   . Minutes of Exercise per Session:   Stress:   . Feeling of Stress :   Social Connections:   . Frequency of Communication with Friends and Family:   . Frequency of Social Gatherings with Friends and Family:   . Attends Religious Services:   . Active Member of Clubs or Organizations:   . Attends Archivist Meetings:   [name] Marital Status:   Intimate Partner Violence:   . Fear of Current or Ex-Partner:   . Emotionally Abused:   [name] Physically Abused:   . Sexually Abused:    family history includes Brain cancer in her son; Cancer in an other family member; Coronary artery disease in an other family member; Liver cancer in her daughter.  Current Facility-Administered Medications:  .  0.9 %  sodium chloride infusion, , Intravenous, PRN, Wendee Beavers  T, MD, Last Rate: 10 mL/hr at 10/19/19 1543, 1,000 mL at 10/19/19 1543 .  acetaminophen (TYLENOL) tablet 650 mg, 650 mg, Oral, Q6H PRN **OR** acetaminophen (TYLENOL) suppository 650 mg, 650 mg, Rectal, Q6H PRN, Smith, Rondell A, MD .  albuterol (PROVENTIL) (2.5 MG/3ML) 0.083% nebulizer solution 2.5 mg, 2.5 mg, Nebulization, Q6H PRN, Smith, Rondell A, MD .  apixaban (ELIQUIS) tablet 2.5 mg, 2.5 mg, Oral, BID, Cyndia Skeeters, Taye T, MD, 2.5 mg at 10/20/19 0946 .  clindamycin (CLEOCIN) IVPB 600 mg, 600 mg, Intravenous, Q8H, Smith, Rondell A, MD, Last Rate: 100 mL/hr at 10/20/19 0457, 600 mg at 10/20/19 0457 .  fluocinonide (LIDEX) 0.05 % external solution 1 application, 1 application,  Topical, PRN, Fuller Plan A, MD .  HYDROcodone-acetaminophen (NORCO/VICODIN) 5-325 MG per tablet 1 tablet, 1 tablet, Oral, Q6H PRN, Fuller Plan A, MD, 1 tablet at 10/17/19 1602 .  metoprolol tartrate (LOPRESSOR) tablet 100 mg, 100 mg, Oral, q morning - 10a, 100 mg at 10/20/19 0945 **AND** metoprolol tartrate (LOPRESSOR) tablet 50 mg, 50 mg, Oral, QHS, Smith, Rondell A, MD, 50 mg at 10/19/19 2111 .  ondansetron (ZOFRAN) tablet 4 mg, 4 mg, Oral, Q6H PRN **OR** ondansetron (ZOFRAN) injection 4 mg, 4 mg, Intravenous, Q6H PRN, Smith, Rondell A, MD .  polyvinyl alcohol (LIQUIFILM TEARS) 1.4 % ophthalmic solution 1 drop, 1 drop, Both Eyes, PRN, Smith, Rondell A, MD .  sodium chloride flush (NS) 0.9 % injection 3 mL, 3 mL, Intravenous, Q12H, Smith, Rondell A, MD, 3 mL at 10/18/19 2357 .  triamcinolone (KENALOG) Q000111Q % cream 1 application, 1 application, Topical, PRN, Norval Morton, MD Allergies  Allergen Reactions  . Antihistamines, Chlorpheniramine-Type Other (See Comments)    Pt states that she passed out.   [name] Penicillins Anaphylaxis and Other (See Comments)    Has patient had a PCN reaction causing immediate rash, facial/tongue/throat swelling, SOB or lightheadedness with hypotension: Yes Has patient had a PCN reaction  causing severe rash involving mucus membranes or skin necrosis: No Has patient had a PCN reaction that required hospitalization No Has patient had a PCN reaction occurring within the last 10 years: No If all of the above answers are "NO", then may proceed with Cephalosporin use.  . Contrast Media [Iodinated Diagnostic Agents] Hives  . Sulfonamide Derivatives Other (See Comments)    Reaction:  Unknown   . Atorvastatin Other (See Comments)    myalgias  . Pheniramine Other (See Comments)    Pt states that she passed out.      Objective:     BP 130/90 (BP Location: Left Arm)   Pulse 65   Temp 97.9 F (36.6 C) (Oral)   Resp 20   Ht 5\' 5"  (1.651 m)   Wt 70.5 kg   SpO2 97%   BMI 25.86 kg/m   No distress  Mouth does not show any signs of Candida  Heart irregular  Lungs clear  Abdomen soft and nontender  Laboratory No components found for: D1    Assessment:     Esophageal dysmotility.  Her problem only seems to be with liquids.      Plan:     From a GI standpoint I think she can go home and follow-up with her primary care physician.  Hopefully her symptoms will continue to gradually improve.  If not we can see her back at GI as an outpatient.  She does not have any problems with solid foods.  As far as liquids are concerned she should drink smaller amounts and stay upright to allow gravity to help liquids passed down the esophagus.  Call us back if needed.

## 2019-10-20 NOTE — Progress Notes (Signed)
Patient ambulated entire length of hallway o2 sats remain 92% or greater while ambulating. She did report some lightheadness while ambulating otherwise felt ok.

## 2019-10-20 NOTE — Plan of Care (Signed)

## 2019-10-20 NOTE — Discharge Summary (Signed)
Physician Discharge Summary  Shelly Obrien [id number] DOB: 05/18/1923 DOA: 10/17/2019  PCP: [name], MD  Admit date: 10/17/2019 Discharge date: 10/20/2019  Admitted From: Home Disposition: Home  Recommendations for Outpatient Follow-up:  1. Follow ups as below. 2. Please obtain CBC/BMP/Mag at follow up 3. Please follow up on the following pending results: None  Home Health: PT Equipment/Devices: Rolling walker  Discharge Condition: Stable CODE STATUS: DNR/DNI  Follow-up Information    [name], MD. Schedule an appointment as soon as possible for a visit in 1 week(s).   Specialty: Internal Medicine Contact information: [address], [address] [place] [address] [phone]        Nahser, Wonda Cheng, MD. Schedule an appointment as soon as possible for a visit in 2 week(s).   Specialty: Cardiology Contact information: [address] [place] [address] [phone]           Hospital Course: 86 year old female with history of CAD, paroxysmal A. fib on Eliquis, HTN and HLD presenting with left leg pain, swelling and redness and admitted for cellulitis, AKI dysphagia..  Patient has dysphagia mostly with liquids not solids.  She was started on IV clindamycin.  SLP consulted and recommended esophagram which was concerning for esophageal dysmotility.  GI consulted and recommended outpatient follow-up with PCP, and outpatient follow-up with GI if no improvement.  Cellulitis improved with IV clindamycin.  Eventually transitioned to p.o. and discharged home on p.o. clindamycin for 5 more days.  She was advised to take probiotics to reduce risk of diarrhea.  Therapy recommended home health PT and rolling walker  On the day of discharge, ambulated on room air and maintained appropriate saturation  See individual problem list below for more hospital course.  Discharge Diagnoses:  Non-purulent cellulitis: Improving.  LLE [place] negative  for DVT.  X-ray without acute finding.  Hx of ananaphylaxis with pcn. Blood cultures negative. No Hx of diabetes. -Received IV clindamycin for 3 days.  Discharged on p.o. clindamycin for 5 more days. -Advised to take probiotics while on clindamycin -Encouraged leg elevation -Outpatient follow-up with PCP next week  Chronic Systolic CHF/severe pHTN: Echo on 2/8 with EF of 25-30%, RWMA, diffuse hypokineses, ?DD, RVSP of 60. Was taken off lasix by her cardiologist recently due to AKI. Has left pedal edema likely from cellulitis vs chf exacerbation. She also has AKI.  -Continue home metoprolol but could benefit from succinate than tartrate. -Advised to follow-up with a cardiologist in 2 weeks  Hx of CAD: no anginal symptoms. -Continue home meds  AKI/Azotemia: Baseline Cr 0.9-1.0 before 07/2019>1.40 (4/1)>1.68(admit)>1.55> 1.38> 1.29. BUN 27> 25. -Recheck BMP in 1 week  Permanent Afib: Heart cardioversion few months ago.  Rate controlled -Continue home metoprolol and Eliquis  Dysphagia with liquids: SLP recommended esophagram +/-GI evaluation.  For esophagram concerning for esophageal dysmotility.  Advised to drink small amounts and stay upright to allow gravity to help, and follow-up with PCP, and outpatient follow-up with GI if no improvement   Debility/physical deconditioning -HH PT and rolling walker ordered.  Goal of care/DNR/DNI-appropriate              Discharge Exam: Vitals:   10/20/19 0953 10/20/19 1127  BP: 130/90 (!) 127/91  Pulse: 65 63  Resp: 20 15  Temp: 97.9 F (36.6 C) 97.7 F (36.5 C)  SpO2: 97% 97%    GENERAL: No apparent distress.  Nontoxic. HEENT: MMM.  Vision and hearing grossly intact.  NECK: Supple.  No apparent JVD.  RESP:  No IWOB.  Fair aeration bilaterally. CVS:  RRR. Heart sounds normal.  ABD/GI/GU: Bowel sounds present. Soft. Non tender.  MSK/EXT:  Moves extremities. No apparent deformity.  Trace edema over LLE SKIN: Some erythema over  LLE as below NEURO: Awake, alert and oriented appropriately.  No apparent focal neuro deficit. PSYCH: Calm. Normal affect.   Left-to-right: From admission to discharge         Discharge Instructions  Discharge Instructions    (HEART FAILURE PATIENTS) Call MD:  Anytime you have any of the following symptoms: 1) 3 pound weight gain in 24 hours or 5 pounds in 1 week 2) shortness of breath, with or without a dry hacking cough 3) swelling in the hands, feet or stomach 4) if you have to sleep on extra pillows at night in order to breathe.   Complete by: As directed    Call MD for:   Complete by: As directed    If your symptoms are worse   Call MD for:  difficulty breathing, headache or visual disturbances   Complete by: As directed    Call MD for:  extreme fatigue   Complete by: As directed    Call MD for:  persistant dizziness or light-headedness   Complete by: As directed    Call MD for:  temperature >100.4   Complete by: As directed    Diet - low sodium heart healthy   Complete by: As directed    Discharge instructions   Complete by: As directed    It has been a pleasure taking care of you! You were hospitalized with left lower leg infection/cellulitis.  You were treated with IV antibiotics.  We are discharging you on oral antibiotics to complete treatment course.  It is very important that you take the whole course of antibiotic.  We also recommend getting probiotic over-the-counter to reduce risk of diarrhea.  In addition to medications, we strongly recommend elevating the leg when you sit.  There could be some changes to your home medication during this hospitalization. Please review your new medication list and the directions before you take your medications.  Please follow-up with your primary care doctor in 1 to 2 weeks  We also recommend follow-up with your cardiologist in 2 to 3 weeks   Take care,   Increase activity slowly   Complete by: As directed      Allergies  as of 10/20/2019      Reactions   Antihistamines, Chlorpheniramine-type Other (See Comments)   Pt states that she passed out.    Penicillins Anaphylaxis, Other (See Comments)   Has patient had a PCN reaction causing immediate rash, facial/tongue/throat swelling, SOB or lightheadedness with hypotension: Yes Has patient had a PCN reaction causing severe rash involving mucus membranes or skin necrosis: No Has patient had a PCN reaction that required hospitalization No Has patient had a PCN reaction occurring within the last 10 years: No If all of the above answers are "NO", then may proceed with Cephalosporin use.   Contrast Media [iodinated Diagnostic Agents] Hives   Sulfonamide Derivatives Other (See Comments)   Reaction:  Unknown    Atorvastatin Other (See Comments)   myalgias   Pheniramine Other (See Comments)   Pt states that she passed out.       Medication List    TAKE these medications   acetaminophen 500 MG tablet Commonly known as: TYLENOL Take 2 tablets (1,000 mg total) by mouth every 8 (  eight) hours.   clindamycin 300 MG capsule Commonly known as: CLEOCIN Take 2 capsules (600 mg total) by mouth 3 (three) times daily for 5 days.   Eliquis 5 MG Tabs tablet Generic drug: apixaban TAKE 1 TABLET BY MOUTH TWICE DAILY What changed: how much to take   fluocinonide 0.05 % external solution Commonly known as: LIDEX Apply 1 application topically as needed (itching).   ICaps Caps Take 1 capsule by mouth 2 (two) times daily.   metoprolol tartrate 50 MG tablet Commonly known as: LOPRESSOR Take 75mg  by mouth twice daily What changed:   how much to take  how to take this  when to take this  additional instructions   NOSE DROPS NA Place 1 drop into the nose daily as needed (alleriges).   Probiotic Acidophilus Tabs Take 1 tablet by mouth in the morning and at bedtime.   THERA TEARS NUTRITION PO Take 1 drop by mouth daily as needed (watery eyes).   triamcinolone  0.025 % cream Commonly known as: KENALOG Apply 1 application topically as needed (itching).            Durable Medical Equipment  (From admission, onward)         Start     Ordered   10/20/19 1252  For home use only DME Walker rolling  Once    Question Answer Comment  Walker: With 5 Inch Wheels   Patient needs a walker to treat with the following condition Unsteady gait      10/20/19 1251          Consultations:  None  Procedures/Studies:   DG Tibia/Fibula Left  Result Date: 10/17/2019 CLINICAL DATA:  Pt arrives via [facility] from home for c/o LL leg pain and redness that began last night. Pt recent diagnosed with afib and started on eliquis. Does endorse hitting her lower leg a few weeks ago on a ladder, wound on anterior left shin EXAM: LEFT TIBIA AND FIBULA - 2 VIEW COMPARISON:  None. FINDINGS: No fracture or bone lesion. Skeletal structures are diffusely demineralized. Ankle and knee joints are normally aligned. There is medial knee joint compartment narrowing. Calcification is noted along the posterior superficial lower leg soft tissues, likely venous. No radiopaque foreign body. No soft tissue air. IMPRESSION: 1. No fracture or bone lesion. No dislocation. No soft tissue foreign body or air. Electronically Signed   By: Lajean Manes M.D.   On: 10/17/2019 12:14   VAS [place] LOWER EXTREMITY VENOUS (DVT) (ONLY [facility] & [facility])  Result Date: 10/18/2019  Lower Venous DVTStudy Indications: Pain, Swelling, Erythema, and Hit shin on ladder a few weeks ago.  Risk Factors: Atrial fibrillation. Anticoagulation: Eliquis. Comparison Study: Prior study from 06/17/2001 is available for comparison Performing Technologist: Sharion Dove RVS  Examination Guidelines: A complete evaluation includes B-mode imaging, spectral Doppler, color Doppler, and power Doppler as needed of all accessible portions of each vessel. Bilateral testing is considered an integral part of a complete examination. Limited  examinations for reoccurring indications may be performed as noted. The reflux portion of the exam is performed with the patient in reverse Trendelenburg.  +-----+---------------+---------+-----------+----------+--------------+ RIGHTCompressibilityPhasicitySpontaneityPropertiesThrombus Aging +-----+---------------+---------+-----------+----------+--------------+ CFV  Full           Yes      Yes                                 +-----+---------------+---------+-----------+----------+--------------+   +---------+---------------+---------+-----------+----------+--------------+ LEFT  CompressibilityPhasicitySpontaneityPropertiesThrombus Aging +---------+---------------+---------+-----------+----------+--------------+ CFV      Full           Yes      Yes                                 +---------+---------------+---------+-----------+----------+--------------+ SFJ      Full                                                        +---------+---------------+---------+-----------+----------+--------------+ FV Prox  Full                                                        +---------+---------------+---------+-----------+----------+--------------+ FV Mid   Full                                                        +---------+---------------+---------+-----------+----------+--------------+ FV DistalFull                                                        +---------+---------------+---------+-----------+----------+--------------+ PFV      Full                                                        +---------+---------------+---------+-----------+----------+--------------+ POP      Full           Yes      Yes                                 +---------+---------------+---------+-----------+----------+--------------+ PTV      Full                                                         +---------+---------------+---------+-----------+----------+--------------+ PERO     Full                                                        +---------+---------------+---------+-----------+----------+--------------+     Summary: RIGHT: - No evidence of common femoral vein obstruction.  LEFT: - Findings appear essentially unchanged compared to previous examination. - There is no evidence of deep vein thrombosis in the lower extremity.  *See table(s) above for measurements and observations. Electronically signed by Ruta Hinds MD  on 10/18/2019 at 8:51:50 AM.    Final    DG ESOPHAGUS W SINGLE CM (SOL OR THIN BA)  Result Date: 10/19/2019 CLINICAL DATA:  Recurrent reflux of liquids into the mouth. EXAM: ESOPHOGRAM/BARIUM SWALLOW TECHNIQUE: Single contrast examination was performed using  thin barium. FLUOROSCOPY TIME:  Fluoroscopy Time: 1 minutes and 48 seconds of low-dose pulsed fluoroscopy Radiation Exposure Index (if provided by the fluoroscopic device): 15.4 mGy Number of Acquired Spot Images: 0 COMPARISON:  Radiographs 07/12/2019. Chest CT 09/25/2015. FINDINGS: Study was performed in the supine, semi erect and right lateral decubitus positions. The patient swallowed the barium without difficulty. No pharyngeal abnormalities or laryngeal penetration observed. There is esophageal motility with a decreased primary stripping wave. In all positions, there was retained barium within the esophageal lumen. No stricture, mass, ulceration or significant hiatal hernia was observed. Several times during the examination, the barium and water refluxed from the esophagus into the patient's mouth. No definite gastroesophageal reflux was seen. At the conclusion of the study, a 13 mm barium tablet was administered. This passed into the distal esophagus, although in the positions evaluated did not pass through the gastroesophageal junction. This appears to be due to the esophageal dysmotility and inability to stand  the patient erect rather than a fixed esophageal stricture. IMPRESSION: 1. Esophageal dysmotility with retained barium within the esophageal lumen. Multiple episodes of reflux of the esophageal contents into the patient's mouth without definite gastroesophageal reflux. 2. No stricture, mass, or significant hiatal hernia. 3. The 13 mm barium tablet passed into the distal esophagus but not the stomach, likely due to the esophageal dysmotility. Electronically Signed   By: Richardean Sale M.D.   On: 10/19/2019 10:46        The results of significant diagnostics from this hospitalization (including imaging, microbiology, ancillary and laboratory) are listed below for reference.     Microbiology: Recent Results (from the past 240 hour(s))  SARS Coronavirus 2 by RT PCR (hospital order, performed in [facility] hospital lab) Nasopharyngeal Nasopharyngeal Swab     Status: None   Collection Time: 10/17/19 11:20 AM   Specimen: Nasopharyngeal Swab  Result Value Ref Range Status   SARS Coronavirus 2 NEGATIVE NEGATIVE Final    Comment: (NOTE) SARS-CoV-2 target nucleic acids are NOT DETECTED. The SARS-CoV-2 RNA is generally detectable in upper and lower respiratory specimens during the acute phase of infection. The lowest concentration of SARS-CoV-2 viral copies this assay can detect is 250 copies / mL. A negative result does not preclude SARS-CoV-2 infection and should not be used as the sole basis for treatment or other patient management decisions.  A negative result may occur with improper specimen collection / handling, submission of specimen other than nasopharyngeal swab, presence of viral mutation(s) within the areas targeted by this assay, and inadequate number of viral copies (<250 copies / mL). A negative result must be combined with clinical observations, patient history, and epidemiological information. Fact Sheet for Patients:   StrictlyIdeas.no Fact Sheet for  Healthcare Providers: BankingDealers.co.za This test is not yet approved or cleared  by the [place] FDA and has been authorized for detection and/or diagnosis of SARS-CoV-2 by FDA under an Emergency Use Authorization (EUA).  This EUA will remain in effect (meaning this test can be used) for the duration of the COVID-19 declaration under Section 564(b)(1) of the Act, 21 U.S.C. section 360bbb-3(b)(1), unless the authorization is terminated or revoked sooner. Performed at [facility], [place] [address] [place], [place]  [address]   Culture, blood (routine x 2)     Status: None (Preliminary result)   Collection Time: 10/17/19  4:40 PM   Specimen: BLOOD RIGHT HAND  Result Value Ref Range Status   Specimen Description BLOOD RIGHT HAND  Final   Special Requests   Final    BOTTLES DRAWN AEROBIC AND ANAEROBIC Blood Culture results may not be optimal due to an inadequate volume of blood received in culture bottles   Culture   Final    NO GROWTH 3 DAYS Performed at [facility], [place] [address], [place], [place] [address]    Report Status PENDING  Incomplete  Culture, blood (routine x 2)     Status: None (Preliminary result)   Collection Time: 10/17/19  4:45 PM   Specimen: BLOOD  Result Value Ref Range Status   Specimen Description BLOOD LEFT ANTECUBITAL  Final   Special Requests   Final    BOTTLES DRAWN AEROBIC AND ANAEROBIC Blood Culture adequate volume   Culture   Final    NO GROWTH 3 DAYS Performed at [facility], [place] [address], [place], [place] [address]    Report Status PENDING  Incomplete     Labs: BNP (last 3 results) Recent Labs    07/12/19 0929 07/17/19 1225  BNP 651.1* [id number]*   Basic Metabolic Panel: Recent Labs  Lab 10/17/19 0801 10/18/19 0319 10/19/19 0431 10/20/19 0221  NA 141 140 139 140  K 5.5* 5.1 4.6 4.7  CL 107 110 109 108  CO2 22 25 24 23   GLUCOSE 130* 108* 104* 91  BUN 30* 27* 26* 25*  CREATININE 1.70*  1.55* 1.38* 1.29*  CALCIUM 9.8 9.3 9.3 8.8*  MG  --   --  1.9 1.9  PHOS  --   --  3.2 3.2   Liver Function Tests: Recent Labs  Lab 10/17/19 0801 10/19/19 0431 10/20/19 0221  AST 31  --   --   ALT 28  --   --   ALKPHOS 69  --   --   BILITOT 1.4*  --   --   PROT 6.4*  --   --   ALBUMIN 3.7 3.1* 2.6*   No results for input(s): LIPASE, AMYLASE in the last 168 hours. No results for input(s): AMMONIA in the last 168 hours. CBC: Recent Labs  Lab 10/17/19 0801 10/18/19 0319 10/19/19 0431 10/20/19 0221  WBC 11.8* 9.9 9.0 6.1  NEUTROABS 9.8*  --   --   --   HGB 12.8 11.8* 12.8 11.3*  HCT 43.5 39.2 42.6 37.3  MCV 97.5 95.8 96.8 95.2  PLT 199 167 190 168   Cardiac Enzymes: No results for input(s): CKTOTAL, CKMB, CKMBINDEX, TROPONINI in the last 168 hours. BNP: Invalid input(s): POCBNP CBG: No results for input(s): GLUCAP in the last 168 hours. D-Dimer No results for input(s): DDIMER in the last 72 hours. Hgb A1c No results for input(s): HGBA1C in the last 72 hours. Lipid Profile No results for input(s): CHOL, HDL, LDLCALC, TRIG, CHOLHDL, LDLDIRECT in the last 72 hours. Thyroid function studies No results for input(s): TSH, T4TOTAL, T3FREE, THYROIDAB in the last 72 hours.  Invalid input(s): FREET3 Anemia work up No results for input(s): VITAMINB12, FOLATE, FERRITIN, TIBC, IRON, RETICCTPCT in the last 72 hours. Urinalysis    Component Value Date/Time   COLORURINE YELLOW 12/14/2017 [place] 12/14/2017 0757   LABSPEC 1.020 04/25/2018 [place] 6.5 04/25/2018 [id number]  GLUCOSEU NEGATIVE 04/25/2018 0925   HGBUR NEGATIVE 04/25/2018 0925   [place] 04/25/2018 0925   KETONESUR NEGATIVE 04/25/2018 0925   PROTEINUR 30 (A) 04/25/2018 0925   UROBILINOGEN 0.2 04/25/2018 0925   NITRITE NEGATIVE 04/25/2018 0925   LEUKOCYTESUR TRACE (A) 04/25/2018 0925   Sepsis Labs Invalid input(s): PROCALCITONIN,  WBC,  LACTICIDVEN   Time coordinating  discharge: 35 minutes  SIGNED:  Mercy Riding, MD  [facility] 10/20/2019, 12:59 PM  If 7PM-7AM, please contact night-coverage [email or web address] Password [email or web address]

## 2019-10-20 NOTE — Progress Notes (Signed)
Discharge instructions given to patient,PIV removed with cath intact. Tele removed CCMD informed. Son called informed could come pick patient up for discharge,he reported would come get her.

## 2019-10-21 ENCOUNTER — Other Ambulatory Visit: Payer: Self-pay

## 2019-10-21 DIAGNOSIS — E538 Deficiency of other specified B group vitamins: Secondary | ICD-10-CM

## 2019-10-22 ENCOUNTER — Inpatient Hospital Stay: Payer: Medicare Other

## 2019-10-22 ENCOUNTER — Telehealth: Payer: Self-pay

## 2019-10-22 ENCOUNTER — Other Ambulatory Visit: Payer: Self-pay | Admitting: Hematology and Oncology

## 2019-10-22 DIAGNOSIS — D472 Monoclonal gammopathy: Secondary | ICD-10-CM | POA: Insufficient documentation

## 2019-10-22 LAB — CULTURE, BLOOD (ROUTINE X 2)
Culture: NO GROWTH
Culture: NO GROWTH
Special Requests: ADEQUATE

## 2019-10-22 NOTE — Telephone Encounter (Signed)
Called and given below message to son. He verbalized understanding. He will call back and reschedule.

## 2019-10-22 NOTE — Telephone Encounter (Signed)
Ok to delay appt, pls cancel her appt tomorrow Get family to call back next month when she is ready to reschedule, labs to be done 1 week prior to appt

## 2019-10-22 NOTE — Telephone Encounter (Signed)
Son called and left a message. She has a lab appt today and recently got out of the hospital. He question is does she need the lab work today? His Mom is not feeling well and they may need to delay today's appt.

## 2019-10-22 NOTE — Telephone Encounter (Signed)
Son called back and canceled today's lab appt due to his mother not feeling well. Lab appt canceled.

## 2019-10-28 DIAGNOSIS — R197 Diarrhea, unspecified: Secondary | ICD-10-CM | POA: Diagnosis not present

## 2019-10-28 DIAGNOSIS — I480 Paroxysmal atrial fibrillation: Secondary | ICD-10-CM | POA: Diagnosis not present

## 2019-10-28 DIAGNOSIS — K224 Dyskinesia of esophagus: Secondary | ICD-10-CM | POA: Diagnosis not present

## 2019-10-28 DIAGNOSIS — N189 Chronic kidney disease, unspecified: Secondary | ICD-10-CM | POA: Diagnosis not present

## 2019-10-28 DIAGNOSIS — I5020 Unspecified systolic (congestive) heart failure: Secondary | ICD-10-CM | POA: Diagnosis not present

## 2019-10-28 DIAGNOSIS — N179 Acute kidney failure, unspecified: Secondary | ICD-10-CM | POA: Diagnosis not present

## 2019-10-29 ENCOUNTER — Inpatient Hospital Stay (HOSPITAL_COMMUNITY)
Admission: EM | Admit: 2019-10-29 | Discharge: 2019-11-03 | DRG: 872 | Disposition: A | Discharge status: Home / Self Care | Payer: Medicare Other | Attending: Internal Medicine | Admitting: Internal Medicine

## 2019-10-29 ENCOUNTER — Emergency Department (HOSPITAL_COMMUNITY): Payer: Medicare Other

## 2019-10-29 ENCOUNTER — Ambulatory Visit: Payer: Medicare Other | Admitting: Hematology and Oncology

## 2019-10-29 ENCOUNTER — Encounter (HOSPITAL_COMMUNITY): Payer: Self-pay

## 2019-10-29 ENCOUNTER — Other Ambulatory Visit: Payer: Self-pay

## 2019-10-29 ENCOUNTER — Other Ambulatory Visit: Payer: Medicare Other

## 2019-10-29 DIAGNOSIS — R609 Edema, unspecified: Secondary | ICD-10-CM

## 2019-10-29 DIAGNOSIS — Z20822 Contact with and (suspected) exposure to covid-19: Secondary | ICD-10-CM | POA: Diagnosis present

## 2019-10-29 DIAGNOSIS — Z7901 Long term (current) use of anticoagulants: Secondary | ICD-10-CM

## 2019-10-29 DIAGNOSIS — I77819 Aortic ectasia, unspecified site: Secondary | ICD-10-CM | POA: Diagnosis present

## 2019-10-29 DIAGNOSIS — R6520 Severe sepsis without septic shock: Secondary | ICD-10-CM

## 2019-10-29 DIAGNOSIS — Z87891 Personal history of nicotine dependence: Secondary | ICD-10-CM

## 2019-10-29 DIAGNOSIS — I252 Old myocardial infarction: Secondary | ICD-10-CM | POA: Diagnosis not present

## 2019-10-29 DIAGNOSIS — N179 Acute kidney failure, unspecified: Secondary | ICD-10-CM | POA: Diagnosis not present

## 2019-10-29 DIAGNOSIS — I2510 Atherosclerotic heart disease of native coronary artery without angina pectoris: Secondary | ICD-10-CM | POA: Diagnosis present

## 2019-10-29 DIAGNOSIS — R234 Changes in skin texture: Secondary | ICD-10-CM | POA: Diagnosis present

## 2019-10-29 DIAGNOSIS — L03116 Cellulitis of left lower limb: Secondary | ICD-10-CM

## 2019-10-29 DIAGNOSIS — R21 Rash and other nonspecific skin eruption: Secondary | ICD-10-CM | POA: Diagnosis present

## 2019-10-29 DIAGNOSIS — L03115 Cellulitis of right lower limb: Secondary | ICD-10-CM | POA: Diagnosis present

## 2019-10-29 DIAGNOSIS — Z880 Allergy status to penicillin: Secondary | ICD-10-CM

## 2019-10-29 DIAGNOSIS — Z808 Family history of malignant neoplasm of other organs or systems: Secondary | ICD-10-CM

## 2019-10-29 DIAGNOSIS — M1710 Unilateral primary osteoarthritis, unspecified knee: Secondary | ICD-10-CM | POA: Diagnosis present

## 2019-10-29 DIAGNOSIS — I130 Hypertensive heart and chronic kidney disease with heart failure and stage 1 through stage 4 chronic kidney disease, or unspecified chronic kidney disease: Secondary | ICD-10-CM | POA: Diagnosis present

## 2019-10-29 DIAGNOSIS — E785 Hyperlipidemia, unspecified: Secondary | ICD-10-CM | POA: Diagnosis present

## 2019-10-29 DIAGNOSIS — M810 Age-related osteoporosis without current pathological fracture: Secondary | ICD-10-CM | POA: Diagnosis present

## 2019-10-29 DIAGNOSIS — R6510 Systemic inflammatory response syndrome (SIRS) of non-infectious origin without acute organ dysfunction: Secondary | ICD-10-CM | POA: Diagnosis not present

## 2019-10-29 DIAGNOSIS — Z8249 Family history of ischemic heart disease and other diseases of the circulatory system: Secondary | ICD-10-CM

## 2019-10-29 DIAGNOSIS — R0902 Hypoxemia: Secondary | ICD-10-CM | POA: Diagnosis not present

## 2019-10-29 DIAGNOSIS — I429 Cardiomyopathy, unspecified: Secondary | ICD-10-CM

## 2019-10-29 DIAGNOSIS — D510 Vitamin B12 deficiency anemia due to intrinsic factor deficiency: Secondary | ICD-10-CM | POA: Diagnosis not present

## 2019-10-29 DIAGNOSIS — Z8719 Personal history of other diseases of the digestive system: Secondary | ICD-10-CM

## 2019-10-29 DIAGNOSIS — I4891 Unspecified atrial fibrillation: Secondary | ICD-10-CM | POA: Diagnosis not present

## 2019-10-29 DIAGNOSIS — Z87442 Personal history of urinary calculi: Secondary | ICD-10-CM

## 2019-10-29 DIAGNOSIS — R0602 Shortness of breath: Secondary | ICD-10-CM | POA: Diagnosis not present

## 2019-10-29 DIAGNOSIS — R5381 Other malaise: Secondary | ICD-10-CM | POA: Diagnosis present

## 2019-10-29 DIAGNOSIS — I10 Essential (primary) hypertension: Secondary | ICD-10-CM | POA: Diagnosis not present

## 2019-10-29 DIAGNOSIS — Z91041 Radiographic dye allergy status: Secondary | ICD-10-CM

## 2019-10-29 DIAGNOSIS — K224 Dyskinesia of esophagus: Secondary | ICD-10-CM | POA: Diagnosis present

## 2019-10-29 DIAGNOSIS — N1832 Chronic kidney disease, stage 3b: Secondary | ICD-10-CM | POA: Diagnosis present

## 2019-10-29 DIAGNOSIS — Z882 Allergy status to sulfonamides status: Secondary | ICD-10-CM

## 2019-10-29 DIAGNOSIS — I5022 Chronic systolic (congestive) heart failure: Secondary | ICD-10-CM | POA: Diagnosis present

## 2019-10-29 DIAGNOSIS — I4821 Permanent atrial fibrillation: Secondary | ICD-10-CM | POA: Diagnosis not present

## 2019-10-29 DIAGNOSIS — Z955 Presence of coronary angioplasty implant and graft: Secondary | ICD-10-CM

## 2019-10-29 DIAGNOSIS — E872 Acidosis: Secondary | ICD-10-CM | POA: Diagnosis not present

## 2019-10-29 DIAGNOSIS — N183 Chronic kidney disease, stage 3 unspecified: Secondary | ICD-10-CM | POA: Diagnosis not present

## 2019-10-29 DIAGNOSIS — Z79899 Other long term (current) drug therapy: Secondary | ICD-10-CM

## 2019-10-29 DIAGNOSIS — I272 Pulmonary hypertension, unspecified: Secondary | ICD-10-CM | POA: Diagnosis present

## 2019-10-29 DIAGNOSIS — Z66 Do not resuscitate: Secondary | ICD-10-CM | POA: Diagnosis not present

## 2019-10-29 DIAGNOSIS — H3530 Unspecified macular degeneration: Secondary | ICD-10-CM | POA: Diagnosis present

## 2019-10-29 DIAGNOSIS — I480 Paroxysmal atrial fibrillation: Secondary | ICD-10-CM | POA: Diagnosis present

## 2019-10-29 DIAGNOSIS — A419 Sepsis, unspecified organism: Secondary | ICD-10-CM

## 2019-10-29 DIAGNOSIS — E875 Hyperkalemia: Secondary | ICD-10-CM | POA: Diagnosis present

## 2019-10-29 DIAGNOSIS — E860 Dehydration: Secondary | ICD-10-CM | POA: Diagnosis present

## 2019-10-29 DIAGNOSIS — Z800 Family history of malignant neoplasm of digestive organs: Secondary | ICD-10-CM

## 2019-10-29 DIAGNOSIS — R509 Fever, unspecified: Secondary | ICD-10-CM | POA: Diagnosis not present

## 2019-10-29 DIAGNOSIS — I4811 Longstanding persistent atrial fibrillation: Secondary | ICD-10-CM | POA: Diagnosis present

## 2019-10-29 DIAGNOSIS — Z888 Allergy status to other drugs, medicaments and biological substances status: Secondary | ICD-10-CM

## 2019-10-29 DIAGNOSIS — M7989 Other specified soft tissue disorders: Secondary | ICD-10-CM | POA: Diagnosis not present

## 2019-10-29 DIAGNOSIS — Z9049 Acquired absence of other specified parts of digestive tract: Secondary | ICD-10-CM

## 2019-10-29 DIAGNOSIS — L0390 Cellulitis, unspecified: Secondary | ICD-10-CM | POA: Diagnosis present

## 2019-10-29 DIAGNOSIS — Z85828 Personal history of other malignant neoplasm of skin: Secondary | ICD-10-CM | POA: Diagnosis not present

## 2019-10-29 DIAGNOSIS — B955 Unspecified streptococcus as the cause of diseases classified elsewhere: Secondary | ICD-10-CM | POA: Diagnosis not present

## 2019-10-29 DIAGNOSIS — I5020 Unspecified systolic (congestive) heart failure: Secondary | ICD-10-CM | POA: Diagnosis present

## 2019-10-29 DIAGNOSIS — Z90710 Acquired absence of both cervix and uterus: Secondary | ICD-10-CM

## 2019-10-29 DIAGNOSIS — R52 Pain, unspecified: Secondary | ICD-10-CM | POA: Diagnosis not present

## 2019-10-29 DIAGNOSIS — N189 Chronic kidney disease, unspecified: Secondary | ICD-10-CM | POA: Diagnosis not present

## 2019-10-29 LAB — CBC WITH DIFFERENTIAL/PLATELET
Abs Immature Granulocytes: 0.04 10*3/uL (ref 0.00–0.07)
Basophils Absolute: 0 10*3/uL (ref 0.0–0.1)
Basophils Relative: 0 %
Eosinophils Absolute: 0.1 10*3/uL (ref 0.0–0.5)
Eosinophils Relative: 1 %
HCT: 42.9 % (ref 36.0–46.0)
Hemoglobin: 12.7 g/dL (ref 12.0–15.0)
Immature Granulocytes: 0 %
Lymphocytes Relative: 7 %
Lymphs Abs: 0.9 10*3/uL (ref 0.7–4.0)
MCH: 28.7 pg (ref 26.0–34.0)
MCHC: 29.6 g/dL — ABNORMAL LOW (ref 30.0–36.0)
MCV: 96.8 fL (ref 80.0–100.0)
Monocytes Absolute: 0.8 10*3/uL (ref 0.1–1.0)
Monocytes Relative: 6 %
Neutro Abs: 11 10*3/uL — ABNORMAL HIGH (ref 1.7–7.7)
Neutrophils Relative %: 86 %
Platelets: 246 10*3/uL (ref 150–400)
RBC: 4.43 MIL/uL (ref 3.87–5.11)
RDW: 17.2 % — ABNORMAL HIGH (ref 11.5–15.5)
WBC: 12.8 10*3/uL — ABNORMAL HIGH (ref 4.0–10.5)
nRBC: 0 % (ref 0.0–0.2)

## 2019-10-29 LAB — BASIC METABOLIC PANEL
Anion gap: 12 (ref 5–15)
BUN: 27 mg/dL — ABNORMAL HIGH (ref 8–23)
CO2: 22 mmol/L (ref 22–32)
Calcium: 9.4 mg/dL (ref 8.9–10.3)
Chloride: 104 mmol/L (ref 98–111)
Creatinine, Ser: 1.93 mg/dL — ABNORMAL HIGH (ref 0.44–1.00)
GFR calc Af Amer: 25 mL/min — ABNORMAL LOW (ref >60)
GFR calc non Af Amer: 22 mL/min — ABNORMAL LOW (ref >60)
Glucose, Bld: 121 mg/dL — ABNORMAL HIGH (ref 70–99)
Potassium: 5.7 mmol/L — ABNORMAL HIGH (ref 3.5–5.1)
Sodium: 138 mmol/L (ref 135–145)

## 2019-10-29 LAB — LACTIC ACID, PLASMA
Lactic Acid, Venous: 2 mmol/L (ref 0.5–1.9)
Lactic Acid, Venous: 1.6 mmol/L (ref 0.5–1.9)

## 2019-10-29 LAB — SARS CORONAVIRUS 2 BY RT PCR (HOSPITAL ORDER, PERFORMED IN ~~LOC~~ HOSPITAL LAB): SARS Coronavirus 2: NEGATIVE

## 2019-10-29 MED ORDER — ONDANSETRON HCL 4 MG/2ML IJ SOLN: 4.0000 mg | Freq: Four times a day (QID) | INTRAMUSCULAR | Status: DC | PRN

## 2019-10-29 MED ORDER — SODIUM CHLORIDE 0.9 % IV BOLUS
500.0000 mL | Freq: Once | INTRAVENOUS | Status: AC
  Administered 2019-10-29: 500 mL via INTRAVENOUS

## 2019-10-29 MED ORDER — SODIUM CHLORIDE 0.9 % IV SOLN: INTRAVENOUS | Status: DC

## 2019-10-29 MED ORDER — ACETAMINOPHEN 500 MG PO TABS
1000.0000 mg | ORAL_TABLET | Freq: Three times a day (TID) | ORAL | Status: DC
  Administered 2019-10-29 – 2019-10-30 (×3): 1000 mg via ORAL
  Filled 2019-10-29 (×8): qty 2

## 2019-10-29 MED ORDER — HEPARIN SODIUM (PORCINE) 5000 UNIT/ML IJ SOLN: 5000.0000 [IU] | Freq: Three times a day (TID) | INTRAMUSCULAR | Status: DC

## 2019-10-29 MED ORDER — ACETAMINOPHEN 325 MG PO TABS: 650.0000 mg | ORAL_TABLET | Freq: Four times a day (QID) | ORAL | Status: DC | PRN

## 2019-10-29 MED ORDER — SORBITOL 70 % SOLN
30.0000 mL | Freq: Every day | Status: DC | PRN
  Filled 2019-10-29: qty 30

## 2019-10-29 MED ORDER — RISAQUAD PO CAPS
1.0000 | ORAL_CAPSULE | Freq: Two times a day (BID) | ORAL | Status: DC
  Administered 2019-10-29 – 2019-11-03 (×10): 1 via ORAL
  Filled 2019-10-29 (×9): qty 1

## 2019-10-29 MED ORDER — ONDANSETRON HCL 4 MG PO TABS: 4.0000 mg | ORAL_TABLET | Freq: Four times a day (QID) | ORAL | Status: DC | PRN

## 2019-10-29 MED ORDER — FENTANYL CITRATE (PF) 100 MCG/2ML IJ SOLN
50.0000 ug | Freq: Once | INTRAMUSCULAR | Status: AC
  Administered 2019-10-29: 50 ug via INTRAVENOUS
  Filled 2019-10-29: qty 2

## 2019-10-29 MED ORDER — HYDRALAZINE HCL 20 MG/ML IJ SOLN: 10.0000 mg | Freq: Four times a day (QID) | INTRAMUSCULAR | Status: DC | PRN

## 2019-10-29 MED ORDER — SENNA 8.6 MG PO TABS
1.0000 | ORAL_TABLET | Freq: Two times a day (BID) | ORAL | Status: DC
  Administered 2019-10-30 – 2019-11-03 (×9): 8.6 mg via ORAL
  Filled 2019-10-29 (×8): qty 1

## 2019-10-29 MED ORDER — CLINDAMYCIN PHOSPHATE 600 MG/50ML IV SOLN
600.0000 mg | Freq: Once | INTRAVENOUS | Status: AC
  Administered 2019-10-29: 600 mg via INTRAVENOUS
  Filled 2019-10-29: qty 50

## 2019-10-29 MED ORDER — HYDROCODONE-ACETAMINOPHEN 5-325 MG PO TABS
1.0000 | ORAL_TABLET | ORAL | Status: DC | PRN
  Administered 2019-10-30: 1 via ORAL
  Filled 2019-10-29: qty 1

## 2019-10-29 MED ORDER — METOPROLOL TARTRATE 50 MG PO TABS
50.0000 mg | ORAL_TABLET | Freq: Two times a day (BID) | ORAL | Status: DC
  Administered 2019-10-29 – 2019-11-03 (×11): 50 mg via ORAL
  Filled 2019-10-29 (×9): qty 1
  Filled 2019-10-29: qty 2
  Filled 2019-10-29: qty 1

## 2019-10-29 MED ORDER — MAGNESIUM HYDROXIDE 400 MG/5ML PO SUSP
30.0000 mL | Freq: Every day | ORAL | Status: DC | PRN
  Administered 2019-10-31: 30 mL via ORAL
  Filled 2019-10-29: qty 30

## 2019-10-29 MED ORDER — PROBIOTIC ACIDOPHILUS PO TABS: 1.0000 | ORAL_TABLET | Freq: Two times a day (BID) | ORAL | Status: DC

## 2019-10-29 MED ORDER — CLINDAMYCIN PHOSPHATE 600 MG/50ML IV SOLN
600.0000 mg | Freq: Three times a day (TID) | INTRAVENOUS | Status: DC
  Administered 2019-10-29 (×2): 600 mg via INTRAVENOUS
  Filled 2019-10-29 (×4): qty 50

## 2019-10-29 MED ORDER — SODIUM CHLORIDE 0.9% FLUSH
3.0000 mL | Freq: Two times a day (BID) | INTRAVENOUS | Status: DC
  Administered 2019-10-29 – 2019-11-03 (×8): 3 mL via INTRAVENOUS

## 2019-10-29 MED ORDER — APIXABAN 2.5 MG PO TABS
2.5000 mg | ORAL_TABLET | Freq: Two times a day (BID) | ORAL | Status: DC
  Administered 2019-10-29 – 2019-11-03 (×11): 2.5 mg via ORAL
  Filled 2019-10-29 (×10): qty 1

## 2019-10-29 MED ORDER — ACETAMINOPHEN 650 MG RE SUPP: 650.0000 mg | Freq: Four times a day (QID) | RECTAL | Status: DC | PRN

## 2019-10-29 NOTE — ED Notes (Signed)
Admitting provider, B Dahal, messaged regarding pts home dose of eliquis.

## 2019-10-29 NOTE — ED Notes (Signed)
Pt placed on 2L O2 via [facility] due to O2 saturation decreasing post fentanyl administration.  Will continue to monitor.

## 2019-10-29 NOTE — ED Provider Notes (Signed)
[facility] DEPT Provider Note   CSN: [id number] Arrival date & time: 10/29/19  0601     History Chief Complaint  Patient presents with  . Leg Swelling    Shelly Obrien is a 86 y.o. female.  HPI 86 year old female presents with right leg pain and redness.  Was just discharged 9 days ago for left leg cellulitis.  Pain started this morning as well as the redness and warmth.  No systemic fevers.  Pain was so bad she could not walk on it.  Also has shortness of breath that she has been having for a few weeks that she was told was from dehydration.  No chest pain.  No drainage from the leg.   Past Medical History:  Diagnosis Date  . Aortic ectasia ([place])   . Arm fracture 2013   right, Dr. Sharen Heck  . B12 deficiency   . CAD S/P percutaneous coronary angioplasty    hx MI 04/ 1994  s/p  PCI to LAD  . CKD (chronic kidney disease), stage III   . Complication of anesthesia    hard to wake   . Gallstones   . History of basal cell carcinoma (BCC) excision    right eye area 08/ 2013  . History of kidney stones 01/20/2017  . History of MI (myocardial infarction) 09/1992   s/p  PCI to LAD  . History of squamous cell carcinoma excision    right lower leg 2015 ;  2016  . Hx of colonic polyps   . Hyperlipidemia   . Hypertension   . Macular degeneration   . Osteoporosis   . PAF (paroxysmal atrial fibrillation) ([place]) dx 02-24-2016   followed by cardiologist-- dr Mamie Nick. [name]  . Pernicious anemia   . PONV (postoperative nausea and vomiting)   . Rib fracture 07/2018   left  . Right ureteral stone   . Wears glasses     Patient Active Problem List   Diagnosis Date Noted  . MGUS (monoclonal gammopathy of unknown significance) 10/22/2019  . Cellulitis 10/17/2019  . Acute kidney injury superimposed on CKD ([place]) 10/17/2019  . DNR (do not resuscitate) 10/17/2019  . Longstanding persistent atrial fibrillation ([place])   . Cardiomyopathy ([place]) 07/14/2019  . Systolic  CHF ([place]) 0000000  . PAF (paroxysmal atrial fibrillation) ([place]) 07/12/2019  . Peripheral neuropathy 08/12/2018  . B12 deficiency 08/12/2018  . Constipation   . Acute on chronic cholecystitis s/p lap cholecystectomy 12/06/2017 12/04/2017  . Nephrolithiasis 12/04/2017  . Abdominal pain 12/04/2017  . Spinal stenosis in cervical region 04/24/2017  . Spinal stenosis of lumbar region 04/24/2017  . Dizziness 04/24/2017  . Ureteral stone with hydronephrosis 01/20/2017  . Atrial fibrillation with RVR ([place]) 02/24/2016  . CKD (chronic kidney disease) stage 3, GFR 30-59 ml/min ([facility]) 02/24/2016  . Paroxysmal atrial fibrillation ([place]) 02/24/2016  . 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 CHOLELITHIASIS 02/14/2009  . Hyperlipidemia 12/29/2008  . Essential hypertension 12/29/2008  . MYOCARDIAL INFARCTION, HX OF 12/29/2008  . CAD (coronary artery disease) 12/29/2008    Past Surgical History:  Procedure Laterality Date  . ABDOMINAL HYSTERECTOMY    . BREAST EXCISIONAL BIOPSY Left 11/02/2009   fibroadenoma (lumpectomy)  . BROW LIFT Right 04/07/2014   Procedure: REPAIR OF ENTROPION AND TRICHIASIS OF RIGHT LOWER EYE LID ;  Surgeon: Theodoro Kos, DO;  Location: [facility];  Service: Plastics;  Laterality: Right;  . CARDIOVERSION N/A 07/18/2017   Procedure: CARDIOVERSION;  Surgeon: Skeet Latch, MD;  Location: [facility];  Service: Cardiovascular;  Laterality: N/A;  . CARDIOVERSION N/A 07/13/2019   Procedure: CARDIOVERSION;  Surgeon: Josue Hector, MD;  Location: [facility] ENDOSCOPY;  Service: Cardiovascular;  Laterality: N/A;  . CARDIOVERSION N/A 09/03/2019   Procedure: CARDIOVERSION;  Surgeon: Buford Dresser, MD;  Location: [facility] ENDOSCOPY;  Service: Cardiovascular;  Laterality: N/A;  . CATARACT EXTRACTION W/ INTRAOCULAR LENS  IMPLANT, BILATERAL    . CHOLECYSTECTOMY N/A 12/06/2017   Procedure: LAPAROSCOPIC CHOLECYSTECTOMY;  Surgeon: Jovita Kussmaul, MD;  Location: [facility];   Service: General;  Laterality: N/A;  . CORONARY ANGIOPLASTY  04/ 1994  dr Lia Foyer   PCI to LAD  . CORONARY ANGIOPLASTY    . CYSTOSCOPY W/ URETERAL STENT PLACEMENT Right 01/20/2017   Procedure: CYSTOSCOPY WITH RIGHT RETROGRADE PYELOGRAM/RIGHT URETERAL STENT PLACEMENT;  Surgeon: Alexis Frock, MD;  Location: [facility];  Service: Urology;  Laterality: Right;  . CYSTOSCOPY WITH RETROGRADE PYELOGRAM, URETEROSCOPY AND STENT PLACEMENT Right 02/15/2017   Procedure: CYSTOSCOPY WITH RETROGRADE PYELOGRAM, URETEROSCOPY, STONE BASKETRY AND STENT REPLACEMENT;  Surgeon: Alexis Frock, MD;  Location: [facility];  Service: Urology;  Laterality: Right;  . CYSTOSCOPY/RETROGRADE/URETEROSCOPY/STONE EXTRACTION WITH BASKET  yrs ago  . DILATION AND CURETTAGE OF UTERUS    . HOLMIUM LASER APPLICATION Right [id number]   Procedure: HOLMIUM LASER APPLICATION;  Surgeon: Alexis Frock, MD;  Location: [facility];  Service: Urology;  Laterality: Right;  . KNEE SURGERY     right  . NEUROPLASTY / TRANSPOSITION ULNAR NERVE AT ELBOW Left 02-23-2000    [facility]   and Left Carpal Tunnel Release  . r eyelid cancer    . TONSILLECTOMY    . TRANSTHORACIC ECHOCARDIOGRAM  02/26/2016   moderate focal basal and mild concentric LVH,  ef 55-60%,  akinesis of the basal-midanteroseptal, inferoseptal and apicalinferior myocardium, study not sufficient to evaluation diastolic funciton due to atrial fib./  trivial PR/  mild TR  . UMBILICAL HERNIA REPAIR       OB History   No obstetric history on file.     Family History  Problem Relation Age of Onset  . Coronary artery disease Other        positive family hx of  . Cancer Other        family hx of  . Brain cancer Son   . Liver cancer Daughter   . Neuropathy Neg Hx     Social History   Tobacco Use  . Smoking status: Former Smoker    Years: 5.00    Types: Cigarettes  . Smokeless tobacco: Never Used  . Tobacco comment: "didn't have the habit that  much", just smoked "with the girls"  Substance Use Topics  . Alcohol use: No    Alcohol/week: 0.0 standard drinks  . Drug use: No    Home Medications Prior to Admission medications   Medication Sig Start Date End Date Taking? Authorizing Provider  acetaminophen (TYLENOL) 500 MG tablet Take 2 tablets (1,000 mg total) by mouth every 8 (eight) hours. 10/20/19 12/19/19 Yes Mercy Riding, MD  DHA-EPA-Flaxseed Oil-Vitamin E (THERA TEARS NUTRITION PO) Take 1 drop by mouth daily as needed (watery eyes).   Yes [provider]  ELIQUIS 5 MG TABS tablet TAKE 1 TABLET BY MOUTH TWICE DAILY Patient taking differently: Take 2.5 mg by mouth 2 (two) times daily.  07/28/19  Yes Sherran Needs, NP  fluocinonide (LIDEX) 0.05 % external solution Apply 1 application topically as needed (itching).  06/15/19  Yes [provider]  metoprolol tartrate (LOPRESSOR) 50 MG tablet Take 75mg  by mouth twice daily Patient taking differently: Take 50 mg by mouth 2 (two) times daily.  10/12/19  Yes Sherran Needs, NP  Multiple Vitamins-Minerals (ICAPS) CAPS Take 1 capsule by mouth 2 (two) times daily.    Yes [provider]  Phenylephrine HCl (NOSE DROPS NA) Place 1 drop into the nose daily as needed (alleriges).   Yes [provider]  triamcinolone (KENALOG) 0.025 % cream Apply 1 application topically as needed (itching).  06/15/19  Yes [provider]  Lactobacillus (PROBIOTIC ACIDOPHILUS) TABS Take 1 tablet by mouth in the morning and at bedtime. Patient not taking: Reported on 10/29/2019 10/20/19   Mercy Riding, MD    Allergies    Antihistamines, chlorpheniramine-type; Penicillins; Contrast media [iodinated diagnostic agents]; Sulfonamide derivatives; Atorvastatin; and Pheniramine  Review of Systems   Review of Systems  Constitutional: Negative for fever.  Respiratory: Positive for shortness of breath.   Cardiovascular: Negative for chest pain.  Gastrointestinal: Positive  for nausea. Negative for vomiting.  Musculoskeletal: Positive for myalgias.  All other systems reviewed and are negative.   Physical Exam Updated Vital Signs BP (!) 141/116   Pulse 100   Temp 98.9 F (37.2 C) (Rectal)   Resp (!) 24   Ht 5\' 5"  (1.651 m)   Wt 70 kg   SpO2 100%   BMI 25.68 kg/m   Physical Exam Vitals and nursing note reviewed.  Constitutional:      General: She is not in acute distress.    Appearance: She is well-developed. She is not ill-appearing or diaphoretic.  HENT:     Head: Normocephalic and atraumatic.     Right Ear: External ear normal.     Left Ear: External ear normal.     Nose: Nose normal.  Eyes:     General:        Right eye: No discharge.        Left eye: No discharge.  Cardiovascular:     Rate and Rhythm: Regular rhythm. Tachycardia present.     Pulses:          Dorsalis pedis pulses are 2+ on the right side.     Heart sounds: Normal heart sounds.  Pulmonary:     Effort: Pulmonary effort is normal.     Breath sounds: Normal breath sounds.  Abdominal:     Palpations: Abdomen is soft.     Tenderness: There is no abdominal tenderness.  Musculoskeletal:     Right lower leg: Swelling and tenderness present.     Right ankle: Swelling present. No tenderness.     Comments: See picture. Diffuse erythema and mild warmth with diffuse tenderness to mild palpation  Skin:    General: Skin is warm and dry.  Neurological:     Mental Status: She is alert.  Psychiatric:        Mood and Affect: Mood is not anxious.       ED Results / Procedures / Treatments   Labs (all labs ordered are listed, but only abnormal results are displayed) Labs Reviewed  CBC WITH DIFFERENTIAL/PLATELET - Abnormal; Notable for the following components:      Result Value   WBC 12.8 (*)    MCHC 29.6 (*)    RDW 17.2 (*)    Neutro Abs 11.0 (*)    All other components within normal limits  BASIC METABOLIC PANEL - Abnormal; Notable for the following components:  Potassium 5.7 (*)    Glucose, Bld 121 (*)    BUN 27 (*)    Creatinine, Ser 1.93 (*)    GFR calc non Af Amer 22 (*)    GFR calc Af Amer 25 (*)    All other components within normal limits  LACTIC ACID, PLASMA - Abnormal; Notable for the following components:   Lactic Acid, Venous 2.0 (*)    All other components within normal limits  CULTURE, BLOOD (ROUTINE X 2)  CULTURE, BLOOD (ROUTINE X 2)  SARS CORONAVIRUS 2 BY RT PCR (HOSPITAL ORDER, [place] [facility])  LACTIC ACID, PLASMA    EKG EKG Interpretation  Date/Time:  Thursday Oct 29 2019 07:28:06 EDT Ventricular Rate:  93 PR Interval:    QRS Duration: 122 QT Interval:  363 QTC Calculation: 452 R Axis:   -75 Text Interpretation: Atrial fibrillation IVCD, consider atypical RBBB LVH with secondary repolarization abnormality Anterior infarct, old similar to Oct 12 2019 Confirmed by Sherwood Gambler [phone]) on 10/29/2019 7:35:16 AM   Radiology CT Tibia Fibula Right Wo Contrast  Result Date: 10/29/2019 CLINICAL DATA:  Cellulitis and erysipelas. EXAM: CT OF THE LOWER RIGHT EXTREMITY WITHOUT CONTRAST TECHNIQUE: Multidetector CT imaging of the right lower extremity was performed according to the standard protocol. COMPARISON:  Right tibia and fibula x-rays from same day. FINDINGS: Bones/Joint/Cartilage No osseous destruction or periosteal reaction. No fracture or dislocation. Tricompartmental knee osteoarthritis, moderate in the medial compartment. Osteopenia. Trace knee joint effusion. Ligaments Ligaments are suboptimally evaluated by CT. Muscles and Tendons Grossly intact. Soft tissue Extensive circumferential skin thickening and soft tissue swelling of the lower leg. No fluid collection or hematoma. No soft tissue mass. IMPRESSION: 1. Extensive circumferential skin thickening and soft tissue swelling of the lower leg, consistent with history of cellulitis. No abscess. 2. No acute osseous abnormality. 3. Tricompartmental knee  osteoarthritis. Electronically Signed   By: Titus Dubin M.D.   On: 10/29/2019 09:41   DG Chest Portable 1 View  Result Date: 10/29/2019 CLINICAL DATA:  Shortness of breath EXAM: PORTABLE CHEST 1 VIEW COMPARISON:  07/12/2019 FINDINGS: Cardiomegaly. Mild diaphragm flattening. There is no edema, consolidation, or pneumothorax. Possible trace pleural effusions. No acute osseous finding. IMPRESSION: 1. Cardiomegaly. 2. Possible trace pleural effusions.  No pulmonary edema. Electronically Signed   By: Monte Fantasia M.D.   On: 10/29/2019 07:45   DG Tibia/Fibula Right Port  Result Date: 10/29/2019 CLINICAL DATA:  Shortness of breath. Right lower extremity redness and swelling. History of cellulitis. EXAM: PORTABLE RIGHT TIBIA AND FIBULA - 2 VIEW COMPARISON:  No recent. FINDINGS: Diffuse soft tissue swelling. Degenerative changes noted about the right knee. No acute bony or joint abnormality identified. No evidence of fracture or dislocation. No focal bony erosions are identified. Mild peripheral vascular calcification. IMPRESSION: Diffuse soft tissue swelling. Diffuse osteopenia. Degenerative changes right knee. No acute or focal bony abnormality. Electronically Signed   By: Marcello Moores  Register   On: 10/29/2019 07:52    Procedures .Critical Care Performed by: Sherwood Gambler, MD Authorized by: Sherwood Gambler, MD   Critical care provider statement:    Critical care time (minutes):  30   Critical care was necessary to treat or prevent imminent or life-threatening deterioration of the following conditions:  Renal failure and sepsis   Critical care was time spent personally by me on the following activities:  Discussions with consultants, evaluation of patient's response to treatment, examination of patient, ordering and performing treatments and interventions, ordering and  review of laboratory studies, ordering and review of radiographic studies, pulse oximetry, re-evaluation of patient's condition,  obtaining history from patient or surrogate and review of old charts   (including critical care time)  Medications Ordered in ED Medications  clindamycin (CLEOCIN) IVPB 600 mg (600 mg Intravenous New Bag/Given 10/29/19 0810)  fentaNYL (SUBLIMAZE) injection 50 mcg (50 mcg Intravenous Given 10/29/19 0800)  sodium chloride 0.9 % bolus 500 mL (0 mLs Intravenous Stopped 10/29/19 [id number])    ED Course  I have reviewed the triage vital signs and the nursing notes.  Pertinent labs & imaging results that were available during my care of the patient were reviewed by me and considered in my medical decision making (see chart for details).    MDM Rules/Calculators/A&P                      Patient presents with recurrent cellulitis though this time in the right leg instead of left.  Given the abrupt onset and significant tenderness, CT was obtained.  No IV contrast because of her allergy.  No obvious deep space infection.  Probably a recurrent cellulitis.  Technically she meets sepsis criteria with AKI, lactic acidosis.  Given IV fluids and clindamycin.  Discussed with hospitalist for admission.  He also asked for DVT ultrasound. Final Clinical Impression(s) / ED Diagnoses Final diagnoses:  Cellulitis of right leg  Severe sepsis [facility])    Rx / DC Orders ED Discharge Orders    None       Sherwood Gambler, MD 10/29/19 1007

## 2019-10-29 NOTE — Progress Notes (Signed)
Lower extremity venous has been completed.   Preliminary results in CV Proc.   Abram Sander 10/29/2019 10:35 AM

## 2019-10-29 NOTE — ED Notes (Signed)
Patient transported to CT 

## 2019-10-29 NOTE — ED Triage Notes (Signed)
Right lower extremity redness and swelling. Warm to touch. Seen recently for cellulitis on LLE 2 weeks ago. Recently dx with afib and started on anticoagulants.

## 2019-10-29 NOTE — ED Notes (Signed)
Pt provided lunch tray.

## 2019-10-29 NOTE — H&P (Signed)
[facility] History and Physical  Shelly Obrien [id number] DOB: 03/30/1923 DOA: 10/29/2019 PCP: [name], MD  Admitted from: Home Chief Complaint: Acute onset pain, redness, warmth of right leg  History of Present Illness: Shelly Obrien is a 86 y.o. female with PMH significant for HTN, HLD, A. fib on Eliquis, CAD/MI s/p stent, chronic systolic CHF, EF 25 to [id number], severe pulmonary hypertension, CKD 3, vitamin B12 deficiency, pernicious anemia. Patient presented to the ED today with complaint of right lower extremity pain, redness, warmth and swelling.  She first noticed it yesterday only and it has acutely gotten worse today. She was recently hospitalized (5/15-5/18) for cellulitis of left lower extremity. She improved with IV clindamycin and discharged on 5 more days of clindamycin which she completed the course of.    In the ED, patient was afebrile, heart rate in 90s, blood pressure in 130s to 140s. Chemistry with potassium elevated to 5.7, BUN/creatinine 27/1.93 which is higher than her baseline 25/1.29 last week. Lactic acid initially elevated to 2, down to 1.6 with hydration. WBC count elevated to 12.8, hemoglobin 12.7, platelet 246 CT scan of the right lower extremity showed 1. Extensive circumferential skin thickening and soft tissue swelling of the lower leg, consistent with history of cellulitis. No abscess. 2. No acute osseous abnormality. 3. Tricompartmental knee osteoarthritis.  Pending ultrasound duplex of lower extremities.  Hospitalist service was consulted for inpatient admission and management At the time of my evaluation, patient was propped up in bed.   Her son was at the bedside. Patient is 36 year old but lives alone, gets around with a walker and is mentally sharp.  She still handles her own bills.  Her son lives 20 minutes away. Patient also has history of skin cancer and sees oncologist on regular intervals.  Review of Systems:  All systems were  reviewed and were negative unless otherwise mentioned in the HPI   Past medical history: Past Medical History:  Diagnosis Date  . Aortic ectasia ([place])   . Arm fracture 2013   right, Dr. Sharen Heck  . B12 deficiency   . CAD S/P percutaneous coronary angioplasty    hx MI 04/ 1994  s/p  PCI to LAD  . CKD (chronic kidney disease), stage III   . Complication of anesthesia    hard to wake   . Gallstones   . History of basal cell carcinoma (BCC) excision    right eye area 08/ 2013  . History of kidney stones 01/20/2017  . History of MI (myocardial infarction) 09/1992   s/p  PCI to LAD  . History of squamous cell carcinoma excision    right lower leg 2015 ;  2016  . Hx of colonic polyps   . Hyperlipidemia   . Hypertension   . Macular degeneration   . Osteoporosis   . PAF (paroxysmal atrial fibrillation) ([place]) dx 02-24-2016   followed by cardiologist-- dr Mamie Nick. [name]  . Pernicious anemia   . PONV (postoperative nausea and vomiting)   . Rib fracture 07/2018   left  . Right ureteral stone   . Wears glasses     Past surgical history: Past Surgical History:  Procedure Laterality Date  . ABDOMINAL HYSTERECTOMY    . BREAST EXCISIONAL BIOPSY Left 11/02/2009   fibroadenoma (lumpectomy)  . BROW LIFT Right 04/07/2014   Procedure: REPAIR OF ENTROPION AND TRICHIASIS OF RIGHT LOWER EYE LID ;  Surgeon: Theodoro Kos, DO;  Location: [place];  Service: Plastics;  Laterality:  Right;  [name] CARDIOVERSION N/A 07/18/2017   Procedure: CARDIOVERSION;  Surgeon: Skeet Latch, MD;  Location: [place];  Service: Cardiovascular;  Laterality: N/A;  . CARDIOVERSION N/A 07/13/2019   Procedure: CARDIOVERSION;  Surgeon: Josue Hector, MD;  Location[facility];  Service: Cardiovascular;  Laterality: N/A;  . CARDIOVERSION N/A 09/03/2019   Procedure: CARDIOVERSION;  Surgeon: Buford Dresser, MD;  Location: [facility] ENDOSCOPY;  Service: Cardiovascular;  Laterality: N/A;  . CATARACT EXTRACTION  W/ INTRAOCULAR LENS  IMPLANT, BILATERAL    . CHOLECYSTECTOMY N/A 12/06/2017   Procedure: LAPAROSCOPIC CHOLECYSTECTOMY;  Surgeon: Jovita Kussmaul, MD;  Location: [facility];  Service: General;  Laterality: N/A;  . CORONARY ANGIOPLASTY  04/ 1994  dr Lia Foyer   PCI to LAD  . CORONARY ANGIOPLASTY    . CYSTOSCOPY W/ URETERAL STENT PLACEMENT Right 01/20/2017   Procedure: CYSTOSCOPY WITH RIGHT RETROGRADE PYELOGRAM/RIGHT URETERAL STENT PLACEMENT;  Surgeon: Alexis Frock, MD;  Location: [facility];  Service: Urology;  Laterality: Right;  . CYSTOSCOPY WITH RETROGRADE PYELOGRAM, URETEROSCOPY AND STENT PLACEMENT Right 02/15/2017   Procedure: CYSTOSCOPY WITH RETROGRADE PYELOGRAM, URETEROSCOPY, STONE BASKETRY AND STENT REPLACEMENT;  Surgeon: Alexis Frock, MD;  Location: [facility];  Service: Urology;  Laterality: Right;  . CYSTOSCOPY/RETROGRADE/URETEROSCOPY/STONE EXTRACTION WITH BASKET  yrs ago  . DILATION AND CURETTAGE OF UTERUS    . HOLMIUM LASER APPLICATION Right [id number]   Procedure: HOLMIUM LASER APPLICATION;  Surgeon: Alexis Frock, MD;  Location: [facility];  Service: Urology;  Laterality: Right;  . KNEE SURGERY     right  . NEUROPLASTY / TRANSPOSITION ULNAR NERVE AT ELBOW Left 02-23-2000    [place]   and Left Carpal Tunnel Release  . r eyelid cancer    . TONSILLECTOMY    . TRANSTHORACIC ECHOCARDIOGRAM  02/26/2016   moderate focal basal and mild concentric LVH,  ef 55-60%,  akinesis of the basal-midanteroseptal, inferoseptal and apicalinferior myocardium, study not sufficient to evaluation diastolic funciton due to atrial fib./  trivial PR/  mild TR  . UMBILICAL HERNIA REPAIR      Social History:  reports that she has quit smoking. Her smoking use included cigarettes. She quit after 5.00 years of use. She has never used smokeless tobacco. She reports that she does not drink alcohol or use drugs.  Allergies:  Allergies  Allergen Reactions  . Antihistamines,  Chlorpheniramine-Type Other (See Comments)    Pt states that she passed out.   [name] Penicillins Anaphylaxis and Other (See Comments)    Has patient had a PCN reaction causing immediate rash, facial/tongue/throat swelling, SOB or lightheadedness with hypotension: Yes Has patient had a PCN reaction causing severe rash involving mucus membranes or skin necrosis: No Has patient had a PCN reaction that required hospitalization No Has patient had a PCN reaction occurring within the last 10 years: No If all of the above answers are "NO", then may proceed with Cephalosporin use.  . Contrast Media [Iodinated Diagnostic Agents] Hives  . Sulfonamide Derivatives Other (See Comments)    Reaction:  Unknown   . Atorvastatin Other (See Comments)    myalgias  . Pheniramine Other (See Comments)    Pt states that she passed out.     Family history:  Family History  Problem Relation Age of Onset  . Coronary artery disease Other        positive family hx of  . Cancer Other        family hx of  . Brain cancer Son   .  Liver cancer Daughter   . Neuropathy Neg Hx      Home Meds: Prior to Admission medications   Medication Sig Start Date End Date Taking? Authorizing Provider  acetaminophen (TYLENOL) 500 MG tablet Take 2 tablets (1,000 mg total) by mouth every 8 (eight) hours. 10/20/19 12/19/19 Yes Mercy Riding, MD  DHA-EPA-Flaxseed Oil-Vitamin E (THERA TEARS NUTRITION PO) Take 1 drop by mouth daily as needed (watery eyes).   Yes [provider]  ELIQUIS 5 MG TABS tablet TAKE 1 TABLET BY MOUTH TWICE DAILY Patient taking differently: Take 2.5 mg by mouth 2 (two) times daily.  07/28/19  Yes Sherran Needs, NP  fluocinonide (LIDEX) 0.05 % external solution Apply 1 application topically as needed (itching).  06/15/19  Yes [provider]  metoprolol tartrate (LOPRESSOR) 50 MG tablet Take 75mg  by mouth twice daily Patient taking differently: Take 50 mg by mouth 2 (two) times daily.  10/12/19  Yes  Sherran Needs, NP  Multiple Vitamins-Minerals (ICAPS) CAPS Take 1 capsule by mouth 2 (two) times daily.    Yes [provider]  Phenylephrine HCl (NOSE DROPS NA) Place 1 drop into the nose daily as needed (alleriges).   Yes [provider]  triamcinolone (KENALOG) 0.025 % cream Apply 1 application topically as needed (itching).  06/15/19  Yes [provider]  Lactobacillus (PROBIOTIC ACIDOPHILUS) TABS Take 1 tablet by mouth in the morning and at bedtime. Patient not taking: Reported on 10/29/2019 10/20/19   Mercy Riding, MD    Physical Exam: Vitals:   10/29/19 0830 10/29/19 1000 10/29/19 1030 10/29/19 1100  BP: (!) 141/116 (!) 130/96 (!) 133/101 (!) 142/109  Pulse: 100     Resp: (!) 24 16 14 13   Temp:      TempSrc:      SpO2: 100%   99%  Weight:      Height:       Wt Readings from Last 3 Encounters:  10/29/19 70 kg  10/20/19 70.5 kg  10/16/19 69.1 kg   Body mass index is 25.68 kg/m.  General exam: Appears calm and comfortable.  Propped up in bed.  Not in distress Skin: No rashes, lesions or ulcers. HEENT: Atraumatic, normocephalic, supple neck, no obvious bleeding Lungs: Clear to auscultation bilaterally CVS: Regular rate and rhythm, no murmur GI/Abd soft, nontender, nondistended, bowel sound present CNS: Alert, awake, oriented x3.  Hard of hearing Psychiatry: Mood appropriate, none Extremities: Both legs with all status changes.  Right leg is swollen, red and tender with some underlying fluid-filled blisters, extending between the ankle and knee.     Consult Orders  (From admission, onward)         Start     Ordered   10/29/19 0948  Consult to hospitalist  ALL PATIENTS BEING ADMITTED/HAVING PROCEDURES NEED COVID-19 SCREENING  Once    Comments: ALL PATIENTS BEING ADMITTED/HAVING PROCEDURES NEED COVID-19 SCREENING  Provider:  (Not yet assigned)  Question Answer Comment  Place call to: [facility]   Reason for Consult Admit       10/29/19 0947          Labs on Admission:   CBC: Recent Labs  Lab 10/29/19 0644  WBC 12.8*  NEUTROABS 11.0*  HGB 12.7  HCT 42.9  MCV 96.8  PLT 0000000    Basic Metabolic Panel: Recent Labs  Lab 10/29/19 0644  NA 138  K 5.7*  CL 104  CO2 22  GLUCOSE 121*  BUN 27*  CREATININE 1.93*  CALCIUM 9.4    Liver Function Tests: No results for input(s): AST, ALT, ALKPHOS, BILITOT, PROT, ALBUMIN in the last 168 hours. No results for input(s): LIPASE, AMYLASE in the last 168 hours. No results for input(s): AMMONIA in the last 168 hours.  Cardiac Enzymes: No results for input(s): CKTOTAL, CKMB, CKMBINDEX, TROPONINI in the last 168 hours.  BNP (last 3 results) Recent Labs    07/12/19 0929 07/17/19 1225  BNP 651.1* 247.3*    ProBNP (last 3 results) No results for input(s): PROBNP in the last 8760 hours.  CBG: No results for input(s): GLUCAP in the last 168 hours.  Lipase     Component Value Date/Time   LIPASE 45 03/09/2018 1001     Urinalysis    Component Value Date/Time   COLORURINE YELLOW 12/14/2017 [place] 12/14/2017 0757   LABSPEC 1.020 04/25/2018 0925   PHURINE 6.5 04/25/2018 0925   GLUCOSEU NEGATIVE 04/25/2018 0925   HGBUR NEGATIVE 04/25/2018 0925   BILIRUBINUR NEGATIVE 04/25/2018 0925   KETONESUR NEGATIVE 04/25/2018 0925   PROTEINUR 30 (A) 04/25/2018 0925   UROBILINOGEN 0.2 04/25/2018 0925   NITRITE NEGATIVE 04/25/2018 0925   LEUKOCYTESUR TRACE (A) 04/25/2018 0925     Drugs of Abuse  No results found for: LABOPIA, COCAINSCRNUR, LABBENZ, AMPHETMU, THCU, LABBARB    Radiological Exams on Admission: CT Tibia Fibula Right Wo Contrast  Result Date: 10/29/2019 CLINICAL DATA:  Cellulitis and erysipelas. EXAM: CT OF THE LOWER RIGHT EXTREMITY WITHOUT CONTRAST TECHNIQUE: Multidetector CT imaging of the right lower extremity was performed according to the standard protocol. COMPARISON:  Right tibia and fibula x-rays from same day. FINDINGS:  Bones/Joint/Cartilage No osseous destruction or periosteal reaction. No fracture or dislocation. Tricompartmental knee osteoarthritis, moderate in the medial compartment. Osteopenia. Trace knee joint effusion. Ligaments Ligaments are suboptimally evaluated by CT. Muscles and Tendons Grossly intact. Soft tissue Extensive circumferential skin thickening and soft tissue swelling of the lower leg. No fluid collection or hematoma. No soft tissue mass. IMPRESSION: 1. Extensive circumferential skin thickening and soft tissue swelling of the lower leg, consistent with history of cellulitis. No abscess. 2. No acute osseous abnormality. 3. Tricompartmental knee osteoarthritis. Electronically Signed   By: Titus Dubin M.D.   On: 10/29/2019 09:41   DG Chest Portable 1 View  Result Date: 10/29/2019 CLINICAL DATA:  Shortness of breath EXAM: PORTABLE CHEST 1 VIEW COMPARISON:  07/12/2019 FINDINGS: Cardiomegaly. Mild diaphragm flattening. There is no edema, consolidation, or pneumothorax. Possible trace pleural effusions. No acute osseous finding. IMPRESSION: 1. Cardiomegaly. 2. Possible trace pleural effusions.  No pulmonary edema. Electronically Signed   By: Monte Fantasia M.D.   On: 10/29/2019 07:45   DG Tibia/Fibula Right Port  Result Date: 10/29/2019 CLINICAL DATA:  Shortness of breath. Right lower extremity redness and swelling. History of cellulitis. EXAM: PORTABLE RIGHT TIBIA AND FIBULA - 2 VIEW COMPARISON:  No recent. FINDINGS: Diffuse soft tissue swelling. Degenerative changes noted about the right knee. No acute bony or joint abnormality identified. No evidence of fracture or dislocation. No focal bony erosions are identified. Mild peripheral vascular calcification. IMPRESSION: Diffuse soft tissue swelling. Diffuse osteopenia. Degenerative changes right knee. No acute or focal bony abnormality. Electronically Signed   By: Marcello Moores  Register   On: 10/29/2019 07:52   VAS [place] LOWER EXTREMITY VENOUS (DVT) (ONLY [facility]  & [facility] 7a-7p)  Result Date: 10/29/2019  Lower Venous DVTStudy Indications: Edema.  Comparison Study: 10/17/19 previous Performing Technologist: Abram Sander RVS  Examination Guidelines: A complete evaluation includes B-mode imaging, spectral Doppler, color Doppler, and power Doppler as needed of all accessible portions of each vessel. Bilateral testing is considered an integral part of a complete examination. Limited examinations for reoccurring indications may be performed as noted. The reflux portion of the exam is performed with the patient in reverse Trendelenburg.  +---------+---------------+---------+-----------+----------+--------------+ RIGHT    CompressibilityPhasicitySpontaneityPropertiesThrombus Aging +---------+---------------+---------+-----------+----------+--------------+ CFV      Full           Yes      Yes                                 +---------+---------------+---------+-----------+----------+--------------+ SFJ      Full                                                        +---------+---------------+---------+-----------+----------+--------------+ FV Prox  Full                                                        +---------+---------------+---------+-----------+----------+--------------+ FV Mid   Full                                                        +---------+---------------+---------+-----------+----------+--------------+ FV DistalFull                                                        +---------+---------------+---------+-----------+----------+--------------+ PFV      Full                                                        +---------+---------------+---------+-----------+----------+--------------+ POP      Full           Yes      Yes                                 +---------+---------------+---------+-----------+----------+--------------+ PTV      Full                                                         +---------+---------------+---------+-----------+----------+--------------+ PERO  Not visualized +---------+---------------+---------+-----------+----------+--------------+   +----+---------------+---------+-----------+----------+--------------+ LEFTCompressibilityPhasicitySpontaneityPropertiesThrombus Aging +----+---------------+---------+-----------+----------+--------------+ CFV Full           Yes      Yes                                 +----+---------------+---------+-----------+----------+--------------+     Summary: RIGHT: - There is no evidence of deep vein thrombosis in the lower extremity.  - No cystic structure found in the popliteal fossa.  LEFT: - No evidence of common femoral vein obstruction.  *See table(s) above for measurements and observations.    Preliminary      ------------------------------------------------------------------------------------------------------ Assessment/Plan: Active Problems:   Cellulitis  Right lower extremity cellulitis -Presented with pain redness, warmth and swelling. -Is very remarkable redness, demarcated just below the knee.  Seems to have underlying clear fluid-filled blisters. -CT scan suggestive of cellulitis.  No report of necrotizing fasciitis or any other deeper infection/abscess. -Started on IV clindamycin in the ED.  Continue the same. -Not in sepsis.  Lactic acidosis -Lactic acid level slightly elevated to, improved with hydration to 1.6.  AKI Hyperkalemia -BUN/creatinine 27/1.93 which is higher than her baseline 25/1.29 last week. -Potassium level elevated to 5.7. -Not on potassium supplement or potassium sparing diuretics. -Start on gentle hydration with normal saline at 50 mill per hour. -Monitor BMP.  Permanent A. fib -Had cardioversion few months ago.   -Rate controlled on Lopressor 75 mg twice daily -Continue anticoagulation with Eliquis 2.5 mg twice  daily.  CAD sp stent -No anginal symptoms at this time. -I do not see any aspirin, statin and her home list.  Chronic Systolic CHF Essential hypertension severe pHTN -Echo on 2/8 with EF of 25-30%, RWMA, diffuse hypokineses, ?DD, RVSP of 60. -Was taken off lasix by her cardiologist recently due to AKI.  -Continue metoprolol.  Dysphagia with liquids Esophageal dysmotility  -Added speech therapy evaluation and esophagogram last admission.  Esophagram was suggestive of esophageal dysmotility.   -Advised to drink small amounts and stay upright to allow gravity to help, and follow-up with PCP, and outpatient follow-up with GI if no improvement  Debility/physical deconditioning -Last time, she was discharged with [facility] PT and rolling walker -Obtain PT eval..  Mobility: PT eval Code Status:  DNR/DNI.  Confirmed with patient and son at bedside  DVT prophylaxis:  Eliquis Antimicrobials:  IV clindamycin Fluid: Normal saline 50 mill per hour Diet: Cardiac diet  Consultants: None Family Communication:  Son at bedside  Dispo: The patient is from: Home              Anticipated d/c is to: SNF.  Patient and family are expecting a discharge to SNF.  Ordered for PT eval              Anticipated d/c date is: > 3 days  ------------------------------------------------------------------------------------- Severity of Illness: The appropriate patient status for this patient is OBSERVATION. Observation status is judged to be reasonable and necessary in order to provide the required intensity of service to ensure the patient's safety. The patient's presenting symptoms, physical exam findings, and initial radiographic and laboratory data in the context of their medical condition is felt to place them at decreased risk for further clinical deterioration. Furthermore, it is anticipated that the patient will be medically stable for discharge from the hospital within 2 midnights of admission. The following  factors support the patient status of observation.   " The patient's presenting symptoms  include acute understood redness, swelling, tenderness of right leg. " The physical exam findings include right leg cellulitis. " The initial radiographic and laboratory data suggestive of cellulitis  -------------------------------------------------------------------------------------  Signed, Terrilee Croak, MD [facility] Pager: [phone] (Secure Chat preferred). 10/29/2019

## 2019-10-29 NOTE — ED Notes (Signed)
Family at bedside. 

## 2019-10-30 DIAGNOSIS — R6510 Systemic inflammatory response syndrome (SIRS) of non-infectious origin without acute organ dysfunction: Secondary | ICD-10-CM

## 2019-10-30 DIAGNOSIS — N189 Chronic kidney disease, unspecified: Secondary | ICD-10-CM

## 2019-10-30 DIAGNOSIS — I5022 Chronic systolic (congestive) heart failure: Secondary | ICD-10-CM

## 2019-10-30 DIAGNOSIS — N179 Acute kidney failure, unspecified: Secondary | ICD-10-CM

## 2019-10-30 DIAGNOSIS — I2510 Atherosclerotic heart disease of native coronary artery without angina pectoris: Secondary | ICD-10-CM

## 2019-10-30 DIAGNOSIS — E875 Hyperkalemia: Secondary | ICD-10-CM

## 2019-10-30 DIAGNOSIS — L03115 Cellulitis of right lower limb: Secondary | ICD-10-CM

## 2019-10-30 DIAGNOSIS — B955 Unspecified streptococcus as the cause of diseases classified elsewhere: Secondary | ICD-10-CM

## 2019-10-30 DIAGNOSIS — I429 Cardiomyopathy, unspecified: Secondary | ICD-10-CM

## 2019-10-30 DIAGNOSIS — N183 Chronic kidney disease, stage 3 unspecified: Secondary | ICD-10-CM

## 2019-10-30 LAB — CBC WITH DIFFERENTIAL/PLATELET
Abs Immature Granulocytes: 0.04 10*3/uL (ref 0.00–0.07)
Basophils Absolute: 0 10*3/uL (ref 0.0–0.1)
Basophils Relative: 0 %
Eosinophils Absolute: 0.1 10*3/uL (ref 0.0–0.5)
Eosinophils Relative: 1 %
HCT: 38.9 % (ref 36.0–46.0)
Hemoglobin: 11.5 g/dL — ABNORMAL LOW (ref 12.0–15.0)
Immature Granulocytes: 0 %
Lymphocytes Relative: 10 %
Lymphs Abs: 0.9 10*3/uL (ref 0.7–4.0)
MCH: 29 pg (ref 26.0–34.0)
MCHC: 29.6 g/dL — ABNORMAL LOW (ref 30.0–36.0)
MCV: 98 fL (ref 80.0–100.0)
Monocytes Absolute: 0.9 10*3/uL (ref 0.1–1.0)
Monocytes Relative: 9 %
Neutro Abs: 7.4 10*3/uL (ref 1.7–7.7)
Neutrophils Relative %: 80 %
Platelets: 193 10*3/uL (ref 150–400)
RBC: 3.97 MIL/uL (ref 3.87–5.11)
RDW: 17 % — ABNORMAL HIGH (ref 11.5–15.5)
WBC: 9.3 10*3/uL (ref 4.0–10.5)
nRBC: 0 % (ref 0.0–0.2)

## 2019-10-30 LAB — BASIC METABOLIC PANEL
Anion gap: 6 (ref 5–15)
Anion gap: 8 (ref 5–15)
BUN: 24 mg/dL — ABNORMAL HIGH (ref 8–23)
BUN: 25 mg/dL — ABNORMAL HIGH (ref 8–23)
CO2: 24 mmol/L (ref 22–32)
CO2: 23 mmol/L (ref 22–32)
Calcium: 8.6 mg/dL — ABNORMAL LOW (ref 8.9–10.3)
Calcium: 9 mg/dL (ref 8.9–10.3)
Chloride: 108 mmol/L (ref 98–111)
Chloride: 106 mmol/L (ref 98–111)
Creatinine, Ser: 1.62 mg/dL — ABNORMAL HIGH (ref 0.44–1.00)
Creatinine, Ser: 1.73 mg/dL — ABNORMAL HIGH (ref 0.44–1.00)
GFR calc Af Amer: 31 mL/min — ABNORMAL LOW (ref >60)
GFR calc Af Amer: 29 mL/min — ABNORMAL LOW (ref >60)
GFR calc non Af Amer: 27 mL/min — ABNORMAL LOW (ref >60)
GFR calc non Af Amer: 25 mL/min — ABNORMAL LOW (ref >60)
Glucose, Bld: 104 mg/dL — ABNORMAL HIGH (ref 70–99)
Glucose, Bld: 108 mg/dL — ABNORMAL HIGH (ref 70–99)
Potassium: 6 mmol/L — ABNORMAL HIGH (ref 3.5–5.1)
Potassium: 6 mmol/L — ABNORMAL HIGH (ref 3.5–5.1)
Sodium: 138 mmol/L (ref 135–145)
Sodium: 137 mmol/L (ref 135–145)

## 2019-10-30 LAB — POTASSIUM
Potassium: 6 mmol/L — ABNORMAL HIGH (ref 3.5–5.1)
Potassium: 6 mmol/L — ABNORMAL HIGH (ref 3.5–5.1)
Potassium: 6.3 mmol/L (ref 3.5–5.1)

## 2019-10-30 MED ORDER — SODIUM ZIRCONIUM CYCLOSILICATE 10 G PO PACK
10.0000 g | PACK | Freq: Once | ORAL | Status: AC
  Administered 2019-10-30: 10 g via ORAL
  Filled 2019-10-30: qty 1

## 2019-10-30 MED ORDER — CEFAZOLIN SODIUM-DEXTROSE 1-4 GM/50ML-% IV SOLN
1.0000 g | Freq: Two times a day (BID) | INTRAVENOUS | Status: DC
  Administered 2019-10-30 – 2019-11-03 (×9): 1 g via INTRAVENOUS
  Filled 2019-10-30 (×9): qty 50

## 2019-10-30 MED ORDER — SODIUM CHLORIDE 0.9 % IV SOLN: INTRAVENOUS | Status: DC | PRN

## 2019-10-30 NOTE — Progress Notes (Signed)
Nurse from ED attempted to call to get report. I was on the phone with the patient's son explaining where the patient would be going/explaining what was going on. Attempted to call back to give report. No answer. Will call back.

## 2019-10-30 NOTE — Progress Notes (Signed)
Pt has critical potassium of 6.3 Made provider aware

## 2019-10-30 NOTE — Progress Notes (Signed)
[facility]  PROGRESS NOTE  [name] [id number] DOB: 08-19-1922 DOA: 10/29/2019 PCP: [name], MD Admit date - 10/29/2019   Admitting Physician Terrilee Croak, MD  Outpatient Primary MD for the patient is [name], MD  LOS - 1 Brief Narrative   Shelly Obrien is a 86 y.o. year old female with medical history significant for HTN, HLD, A. fib on Eliquis, CAD/MI s/p stent, chronic systolic CHF, EF 25 to [id number], severe pulmonary hypertension, CKD 3, vitamin B12 deficiency, pernicious anemia who presented on 10/29/2019 with worsening right lower extremity pain, redness, swelling over the past day.  Patient was recently hospitalized from 5/15-5/18 for cellulitis of contralateral leg (left lower extremity) and was treated with IV clindamycin with improvement and discharged on 5 additional days of Clinda which she completed with no complications and noted improvement.    In the ED patient was afebrile, hemodynamically stable, slight tachypnea respiratory rate of 22, Covid test negative, lactic acid 2.  Blood cultures were obtained x2.  WBC 12.8, potassium 5.7, creatinine 1.93, BUN 27. Chest x-ray showed cardiomegaly with possible trace pleural effusions without pulmonary edema. X-ray of tibia/fibula on right showed diffuse soft tissue swelling. CT of right tibia/fibula showed extensive circumferential skin thickening/soft tissue swelling consistent with cellulitis with no abscess or acute osseous abnormality. Patient received IV clindamycin in the ED.  TRH was called for further management.    Subjective  Today she reports significant pain in right lower extremity, denies any drainage, denies any fevers or chills.  A & P  Nonpurulent Cellulitis of Right lower extremity with SIRS. Has bullae and fairly demarcated area of leg after completing outpatient therapy with clindamycin for contralteral leg(left) lower extremity cellulitis CT imaging confirms SSTI without abscess or bone  involvement.  No clear nidus, had leukocytosis and tachypnea  but no fever or other signs of sepsis, venous Doppler negative for DVT -D/C clindamycin as doubt MRSA -Appreciate ID recommendations, given concern for streptococcal infection we will transition to IV cefazolin -Continue to monitor rash (skin markings placed per nursing), imaging documented in chart -Daily CBC -Monitor blood cultures  AKI on CKD stage III with hyperkalemia.  In setting of ongoing infection.  Baseline creatinine 1-1 0.6.  1.9 on admission, slowly improving.  Potassium of 5.7 did not improve with IV fluids now 6 -No peak T waves on EKG, continue to monitor on telemetry -Add Lokelma, monitor repeat BMP this afternoon -Avoid nephrotoxins -Monitor output -UA for evaluation  Chronic systolic CHF with severe pulmonary hypertension.  EF 25-30% (TTE, 07/2019).  Aside from right lower extremity swelling related to cellulitis looks euvolemic on exam.  She does report worsening DOE over the past week.  Chest x-ray on admission shows trace pleural effusions per report taken off Lasix by cardiologist due to recent AKI. -Daily weights -Strict I's and O's -Low-salt diet, fluid restriction -continue home Lopressor  Persistent atrial fibrillation, rate controlled , previously on amiodarone now only on rate control with anticoagulation.  Cardioversion a few months ago this year -Continue home Lopressor 50 mg twice daily -continue Eliquis 5 mg twice daily   CAD status post remote PCI ,asymptomatic -no statin or aspirin at home was  Dysphagia with liquids.  Esophagram last admission showed esophageal dysmotility.  GI the time advised it is more amounts of liquids and sitting upright allow gravity to help -Speech therapy evaluation ordered on admission  Pernicious anemia.  Hemoglobin stable at baseline 11-12.  No localizing signs or symptoms of  bleeding. Not on vitamin b12. MCV wnl.        Family Communication  : We will  update Code Status : DNR  Disposition Plan  :  Patient is from home. Anticipated d/c date: 2 to 3 days. Barriers to d/c or necessity for inpatient status:  Requires close monitoring of rash/cellulitis on IV cefazolin, monitor blood cultures, close monitoring of fluid status Consults  : ID  Procedures  : Venous duplex 5/27  DVT Prophylaxis  : Eliquis  Lab Results  Component Value Date   PLT 193 10/30/2019    Diet :  Diet Order            Diet Heart Room service appropriate? Yes; Fluid consistency: Thin  Diet effective now               Inpatient Medications Scheduled Meds: . acetaminophen  1,000 mg Oral Q8H  . acidophilus  1 capsule Oral BID  . apixaban  2.5 mg Oral BID  . metoprolol tartrate  50 mg Oral BID  . senna  1 tablet Oral BID  . sodium chloride flush  3 mL Intravenous Q12H   Continuous Infusions: . sodium chloride 50 mL/hr at 10/29/19 1721  . sodium chloride    . clindamycin (CLEOCIN) IV 600 mg (10/29/19 2335)   PRN Meds:.sodium chloride, acetaminophen **OR** acetaminophen, hydrALAZINE, HYDROcodone-acetaminophen, magnesium hydroxide, ondansetron **OR** ondansetron (ZOFRAN) IV, sorbitol  Antibiotics  :   Anti-infectives (From admission, onward)   Start     Dose/Rate Route Frequency Ordered Stop   10/29/19 1600  clindamycin (CLEOCIN) IVPB 600 mg     600 mg 100 mL/hr over 30 Minutes Intravenous Every 8 hours 10/29/19 1421     10/29/19 0715  clindamycin (CLEOCIN) IVPB 600 mg     600 mg 100 mL/hr over 30 Minutes Intravenous  Once 10/29/19 0705 10/29/19 1000       Objective   Vitals:   10/29/19 1547 10/29/19 2206 10/30/19 0216 10/30/19 0557  BP: (!) 118/98 119/90 (!) 126/91 (!) 147/85  Pulse: 88 84 84 89  Resp: 17 17 18 18   Temp:  97.6 F (36.4 C) (!) 97.3 F (36.3 C) 97.9 F (36.6 C)  TempSrc:   Oral Oral  SpO2: 97% 98% 96% 96%  Weight:      Height:        SpO2: 96 % O2 Flow Rate (L/min): 2 L/min  Wt Readings from Last 3 Encounters:    10/29/19 70 kg  10/20/19 70.5 kg  10/16/19 69.1 kg     Intake/Output Summary (Last 24 hours) at 10/30/2019 0926 Last data filed at 10/30/2019 [id number] Gross per 24 hour  Intake 2224.16 ml  Output 750 ml  Net 1474.16 ml    Physical Exam:  Elderly female, lying in bed, no distress Irregularly irregular rhythm, normal rate, 1+ pitting edema right lower extremity, no edema to left lower extremity Normal respiratory effort on room air, no crackles appreciated Abdomen soft, nondistended, nontender Right lower extremity with demarcated redness, some areas of blanching, scattered bullae with no open drainage, exquisitely tender to palpation            I have personally reviewed the following:   Data Reviewed:  CBC Recent Labs  Lab 10/29/19 0644 10/30/19 0438  WBC 12.8* 9.3  HGB 12.7 11.5*  HCT 42.9 38.9  PLT 246 193  MCV 96.8 98.0  MCH 28.7 29.0  MCHC 29.6* 29.6*  RDW 17.2* 17.0*  LYMPHSABS 0.9 0.9  MONOABS 0.8 0.9  EOSABS 0.1 0.1  BASOSABS 0.0 0.0    Chemistries  Recent Labs  Lab 10/29/19 0644 10/30/19 0438 10/30/19 0808  NA 138 138 137  K 5.7* 6.0* 6.0*  CL 104 108 106  CO2 22 24 23   GLUCOSE 121* 104* 108*  BUN 27* 24* 25*  CREATININE 1.93* 1.62* 1.73*  CALCIUM 9.4 8.6* 9.0   ------------------------------------------------------------------------------------------------------------------ No results for input(s): CHOL, HDL, LDLCALC, TRIG, CHOLHDL, LDLDIRECT in the last 72 hours.  Lab Results  Component Value Date   HGBA1C 5.7 (H) 08/12/2018   ------------------------------------------------------------------------------------------------------------------ No results for input(s): TSH, T4TOTAL, T3FREE, THYROIDAB in the last 72 hours.  Invalid input(s): FREET3 ------------------------------------------------------------------------------------------------------------------ No results for input(s): VITAMINB12, FOLATE, FERRITIN, TIBC, IRON,  RETICCTPCT in the last 72 hours.  Coagulation profile No results for input(s): INR, PROTIME in the last 168 hours.  No results for input(s): DDIMER in the last 72 hours.  Cardiac Enzymes No results for input(s): CKMB, TROPONINI, MYOGLOBIN in the last 168 hours.  Invalid input(s): CK ------------------------------------------------------------------------------------------------------------------    Component Value Date/Time   BNP 247.3 (H) 07/17/2019 1225   BNP 651.1 (H) 07/12/2019 0929    Micro Results Recent Results (from the past 240 hour(s))  Culture, blood (routine x 2)     Status: None (Preliminary result)   Collection Time: 10/29/19  7:45 AM   Specimen: Left Antecubital; Blood  Result Value Ref Range Status   Specimen Description   Final    LEFT ANTECUBITAL Performed at [facility] [address], [place], [place] [address]    Special Requests   Final    BOTTLES DRAWN AEROBIC AND ANAEROBIC Blood Culture results may not be optimal due to an excessive volume of blood received in culture bottles Performed at [facility] [address], [place], [place] [address]    Culture   Final    NO GROWTH < 24 HOURS Performed at [facility] [address] [place], [place] [address]    Report Status PENDING  Incomplete  Culture, blood (routine x 2)     Status: None (Preliminary result)   Collection Time: 10/29/19  8:05 AM   Specimen: BLOOD RIGHT FOREARM  Result Value Ref Range Status   Specimen Description   Final    BLOOD RIGHT FOREARM Performed at [facility] [address], [place], [place] [address]    Special Requests   Final    BOTTLES DRAWN AEROBIC AND ANAEROBIC Blood Culture adequate volume Performed at [facility] [address] [place], [place] [address]    Culture   Final    NO GROWTH < 24 HOURS Performed at [facility] [address] [place], [place] [address]     Report Status PENDING  Incomplete  SARS Coronavirus 2 by RT PCR (hospital order, performed in [facility] hospital lab) Nasopharyngeal Nasopharyngeal Swab     Status: None   Collection Time: 10/29/19 10:17 AM   Specimen: Nasopharyngeal Swab  Result Value Ref Range Status   SARS Coronavirus 2 NEGATIVE NEGATIVE Final    Comment: (NOTE) SARS-CoV-2 target nucleic acids are NOT DETECTED. The SARS-CoV-2 RNA is generally detectable in upper and lower respiratory specimens during the acute phase of infection. The lowest concentration of SARS-CoV-2 viral copies this assay can detect is 250 copies / mL. A negative result does not preclude SARS-CoV-2 infection and should not be used as the sole basis for treatment or other  patient management decisions.  A negative result may occur with improper specimen collection / handling, submission of specimen other than nasopharyngeal swab, presence of viral mutation(s) within the areas targeted by this assay, and inadequate number of viral copies (<250 copies / mL). A negative result must be combined with clinical observations, patient history, and epidemiological information. Fact Sheet for Patients:   StrictlyIdeas.no Fact Sheet for Healthcare Providers: BankingDealers.co.za This test is not yet approved or cleared  by the [place] FDA and has been authorized for detection and/or diagnosis of SARS-CoV-2 by FDA under an Emergency Use Authorization (EUA).  This EUA will remain in effect (meaning this test can be used) for the duration of the COVID-19 declaration under Section 564(b)(1) of the Act, 21 U.S.C. section 360bbb-3(b)(1), unless the authorization is terminated or revoked sooner. Performed at [facility], [place] [address], [place], [place] [address]     Radiology Reports DG Tibia/Fibula Left  Result Date: 10/17/2019 CLINICAL DATA:  Pt arrives via [facility] from home for c/o  LL leg pain and redness that began last night. Pt recent diagnosed with afib and started on eliquis. Does endorse hitting her lower leg a few weeks ago on a ladder, wound on anterior left shin EXAM: LEFT TIBIA AND FIBULA - 2 VIEW COMPARISON:  None. FINDINGS: No fracture or bone lesion. Skeletal structures are diffusely demineralized. Ankle and knee joints are normally aligned. There is medial knee joint compartment narrowing. Calcification is noted along the posterior superficial lower leg soft tissues, likely venous. No radiopaque foreign body. No soft tissue air. IMPRESSION: 1. No fracture or bone lesion. No dislocation. No soft tissue foreign body or air. Electronically Signed   By: Lajean Manes M.D.   On: 10/17/2019 12:14   CT Tibia Fibula Right Wo Contrast  Result Date: 10/29/2019 CLINICAL DATA:  Cellulitis and erysipelas. EXAM: CT OF THE LOWER RIGHT EXTREMITY WITHOUT CONTRAST TECHNIQUE: Multidetector CT imaging of the right lower extremity was performed according to the standard protocol. COMPARISON:  Right tibia and fibula x-rays from same day. FINDINGS: Bones/Joint/Cartilage No osseous destruction or periosteal reaction. No fracture or dislocation. Tricompartmental knee osteoarthritis, moderate in the medial compartment. Osteopenia. Trace knee joint effusion. Ligaments Ligaments are suboptimally evaluated by CT. Muscles and Tendons Grossly intact. Soft tissue Extensive circumferential skin thickening and soft tissue swelling of the lower leg. No fluid collection or hematoma. No soft tissue mass. IMPRESSION: 1. Extensive circumferential skin thickening and soft tissue swelling of the lower leg, consistent with history of cellulitis. No abscess. 2. No acute osseous abnormality. 3. Tricompartmental knee osteoarthritis. Electronically Signed   By: Titus Dubin M.D.   On: 10/29/2019 09:41   DG Chest Portable 1 View  Result Date: 10/29/2019 CLINICAL DATA:  Shortness of breath EXAM: PORTABLE CHEST 1  VIEW COMPARISON:  07/12/2019 FINDINGS: Cardiomegaly. Mild diaphragm flattening. There is no edema, consolidation, or pneumothorax. Possible trace pleural effusions. No acute osseous finding. IMPRESSION: 1. Cardiomegaly. 2. Possible trace pleural effusions.  No pulmonary edema. Electronically Signed   By: Monte Fantasia M.D.   On: 10/29/2019 07:45   DG Tibia/Fibula Right Port  Result Date: 10/29/2019 CLINICAL DATA:  Shortness of breath. Right lower extremity redness and swelling. History of cellulitis. EXAM: PORTABLE RIGHT TIBIA AND FIBULA - 2 VIEW COMPARISON:  No recent. FINDINGS: Diffuse soft tissue swelling. Degenerative changes noted about the right knee. No acute bony or joint abnormality identified. No evidence of fracture or dislocation. No focal bony erosions are identified. Mild  peripheral vascular calcification. IMPRESSION: Diffuse soft tissue swelling. Diffuse osteopenia. Degenerative changes right knee. No acute or focal bony abnormality. Electronically Signed   By: Marcello Moores  Register   On: 10/29/2019 07:52   VAS [place] LOWER EXTREMITY VENOUS (DVT) (ONLY [facility] & [facility] 7a-7p)  Result Date: 10/29/2019  Lower Venous DVTStudy Indications: Edema.  Comparison Study: 10/17/19 previous Performing Technologist: Abram Sander RVS  Examination Guidelines: A complete evaluation includes B-mode imaging, spectral Doppler, color Doppler, and power Doppler as needed of all accessible portions of each vessel. Bilateral testing is considered an integral part of a complete examination. Limited examinations for reoccurring indications may be performed as noted. The reflux portion of the exam is performed with the patient in reverse Trendelenburg.  +---------+---------------+---------+-----------+----------+--------------+ RIGHT    CompressibilityPhasicitySpontaneityPropertiesThrombus Aging +---------+---------------+---------+-----------+----------+--------------+ CFV      Full           Yes      Yes                                  +---------+---------------+---------+-----------+----------+--------------+ SFJ      Full                                                        +---------+---------------+---------+-----------+----------+--------------+ FV Prox  Full                                                        +---------+---------------+---------+-----------+----------+--------------+ FV Mid   Full                                                        +---------+---------------+---------+-----------+----------+--------------+ FV DistalFull                                                        +---------+---------------+---------+-----------+----------+--------------+ PFV      Full                                                        +---------+---------------+---------+-----------+----------+--------------+ POP      Full           Yes      Yes                                 +---------+---------------+---------+-----------+----------+--------------+ PTV      Full                                                        +---------+---------------+---------+-----------+----------+--------------+  PERO                                                  Not visualized +---------+---------------+---------+-----------+----------+--------------+   +----+---------------+---------+-----------+----------+--------------+ LEFTCompressibilityPhasicitySpontaneityPropertiesThrombus Aging +----+---------------+---------+-----------+----------+--------------+ CFV Full           Yes      Yes                                 +----+---------------+---------+-----------+----------+--------------+     Summary: RIGHT: - There is no evidence of deep vein thrombosis in the lower extremity.  - No cystic structure found in the popliteal fossa.  LEFT: - No evidence of common femoral vein obstruction.  *See table(s) above for measurements and observations. Electronically signed by  Servando Snare MD on 10/29/2019 at 3:40:49 PM.    Final    VAS [place] LOWER EXTREMITY VENOUS (DVT) (ONLY [facility] & [facility])  Result Date: 10/18/2019  Lower Venous DVTStudy Indications: Pain, Swelling, Erythema, and Hit shin on ladder a few weeks ago.  Risk Factors: Atrial fibrillation. Anticoagulation: Eliquis. Comparison Study: Prior study from 06/17/2001 is available for comparison Performing Technologist: Sharion Dove RVS  Examination Guidelines: A complete evaluation includes B-mode imaging, spectral Doppler, color Doppler, and power Doppler as needed of all accessible portions of each vessel. Bilateral testing is considered an integral part of a complete examination. Limited examinations for reoccurring indications may be performed as noted. The reflux portion of the exam is performed with the patient in reverse Trendelenburg.  +-----+---------------+---------+-----------+----------+--------------+ RIGHTCompressibilityPhasicitySpontaneityPropertiesThrombus Aging +-----+---------------+---------+-----------+----------+--------------+ CFV  Full           Yes      Yes                                 +-----+---------------+---------+-----------+----------+--------------+   +---------+---------------+---------+-----------+----------+--------------+ LEFT     CompressibilityPhasicitySpontaneityPropertiesThrombus Aging +---------+---------------+---------+-----------+----------+--------------+ CFV      Full           Yes      Yes                                 +---------+---------------+---------+-----------+----------+--------------+ SFJ      Full                                                        +---------+---------------+---------+-----------+----------+--------------+ FV Prox  Full                                                        +---------+---------------+---------+-----------+----------+--------------+ FV Mid   Full                                                         +---------+---------------+---------+-----------+----------+--------------+  FV DistalFull                                                        +---------+---------------+---------+-----------+----------+--------------+ PFV      Full                                                        +---------+---------------+---------+-----------+----------+--------------+ POP      Full           Yes      Yes                                 +---------+---------------+---------+-----------+----------+--------------+ PTV      Full                                                        +---------+---------------+---------+-----------+----------+--------------+ PERO     Full                                                        +---------+---------------+---------+-----------+----------+--------------+     Summary: RIGHT: - No evidence of common femoral vein obstruction.  LEFT: - Findings appear essentially unchanged compared to previous examination. - There is no evidence of deep vein thrombosis in the lower extremity.  *See table(s) above for measurements and observations. Electronically signed by Ruta Hinds MD on 10/18/2019 at 8:51:50 AM.    Final    DG ESOPHAGUS W SINGLE CM (SOL OR THIN BA)  Result Date: 10/19/2019 CLINICAL DATA:  Recurrent reflux of liquids into the mouth. EXAM: ESOPHOGRAM/BARIUM SWALLOW TECHNIQUE: Single contrast examination was performed using  thin barium. FLUOROSCOPY TIME:  Fluoroscopy Time: 1 minutes and 48 seconds of low-dose pulsed fluoroscopy Radiation Exposure Index (if provided by the fluoroscopic device): 15.4 mGy Number of Acquired Spot Images: 0 COMPARISON:  Radiographs 07/12/2019. Chest CT 09/25/2015. FINDINGS: Study was performed in the supine, semi erect and right lateral decubitus positions. The patient swallowed the barium without difficulty. No pharyngeal abnormalities or laryngeal penetration observed. There is esophageal motility with a  decreased primary stripping wave. In all positions, there was retained barium within the esophageal lumen. No stricture, mass, ulceration or significant hiatal hernia was observed. Several times during the examination, the barium and water refluxed from the esophagus into the patient's mouth. No definite gastroesophageal reflux was seen. At the conclusion of the study, a 13 mm barium tablet was administered. This passed into the distal esophagus, although in the positions evaluated did not pass through the gastroesophageal junction. This appears to be due to the esophageal dysmotility and inability to stand the patient erect rather than a fixed esophageal stricture. IMPRESSION: 1. Esophageal dysmotility with retained barium within the esophageal lumen. Multiple episodes of reflux of the esophageal contents into the patient's mouth  without definite gastroesophageal reflux. 2. No stricture, mass, or significant hiatal hernia. 3. The 13 mm barium tablet passed into the distal esophagus but not the stomach, likely due to the esophageal dysmotility. Electronically Signed   By: Richardean Sale M.D.   On: 10/19/2019 10:46     Time Spent in minutes  30     Desiree Hane M.D on 10/30/2019 at 9:26 AM  To page go to [email or web address] - password [facility]

## 2019-10-30 NOTE — Progress Notes (Signed)
Orders for telemetry were put in by MD. Attempted to call report. RN not available. Left number to secretary to be called back.

## 2019-10-30 NOTE — Progress Notes (Signed)
Nutrition Brief Note  Patient identified on the Malnutrition Screening Tool (MST) Report  Patient's weight is stable. Currently consuming 75-100% of meals.   Wt Readings from Last 15 Encounters:  10/29/19 70 kg  10/20/19 70.5 kg  10/16/19 69.1 kg  10/12/19 66.7 kg  09/14/19 69.9 kg  08/31/19 71.9 kg  07/27/19 70.3 kg  07/20/19 69.7 kg  07/14/19 70.8 kg  07/09/19 70.9 kg  02/04/19 71.6 kg  10/15/18 73.6 kg  08/12/18 73.5 kg  07/28/18 72.6 kg  04/21/18 73 kg    Body mass index is 25.68 kg/m. Patient meets criteria for normal based on current BMI.   Current diet order is renal, patient is consuming approximately 75-100% of meals at this time. Labs and medications reviewed.   No nutrition interventions warranted at this time. If nutrition issues arise, please consult RD.   Clayton Bibles, MS, RD, LDN Inpatient Clinical Dietitian Contact information available via Amion

## 2019-10-30 NOTE — Progress Notes (Signed)
Paged. Dr. Oretha Milch about the patient's need for telemetry. Per Dr. Oretha Milch, MD, pt will need telemetry due to elevated potassium and A.Fib history. Bed request for telemetry unit has been put in. Waiting for an available bed to transfer patient to telemetry.

## 2019-10-30 NOTE — Consult Note (Signed)
[facility]       Reason for Consult: cellulitis    Referring Physician: Dr. Theodoro Grist  Active Problems:   Paroxysmal atrial fibrillation ([place])   Cellulitis   Acute kidney injury superimposed on CKD ([place])   Cellulitis of right leg   Hyperkalemia   SIRS (systemic inflammatory response syndrome) ([place])   . acetaminophen  1,000 mg Oral Q8H  . acidophilus  1 capsule Oral BID  . apixaban  2.5 mg Oral BID  . metoprolol tartrate  50 mg Oral BID  . senna  1 tablet Oral BID  . sodium chloride flush  3 mL Intravenous Q12H    Recommendations: Cefazolin  Monitor for rash  Assessment: Her right leg is red, tender with a sharp demarcated area most c/w erysipelas.   This is most predominately Streptococcus and very possible not covered by clindamycin.  MRSA coverage not indicated.  Her allergy is noted but she has had a cephalosporin in the past by report.  The reaction was more than 10 years ago.  No throat swelling, no difficulty breathing, no hypotension, just hives.    Antibiotics: Clindamycin.   HPI: Shelly Obrien is a 86 y.o. female with HTN, HLD, Afib on Eliquis, CAD s/p stent, CHF who was recently hospitalized with left leg cellulitis that improved then came back with right leg erythema.  No fever or chills.  Rapidly has progressed and was initially on clindamycin due to her allergy.  Some leukocytosis but no fever here.     Review of Systems:  Constitutional: negative for fevers and chills Gastrointestinal: negative for nausea and diarrhea Integument/breast: negative for rash All other systems reviewed and are negative    Past Medical History:  Diagnosis Date  . Aortic ectasia ([place])   . Arm fracture 2013   right, Dr. Sharen Heck  . B12 deficiency   . CAD S/P percutaneous coronary angioplasty    hx MI 04/ 1994  s/p  PCI to LAD  . CKD (chronic kidney disease), stage III   . Complication of anesthesia    hard to wake   . Gallstones   . History  of basal cell carcinoma (BCC) excision    right eye area 08/ 2013  . History of kidney stones 01/20/2017  . History of MI (myocardial infarction) 09/1992   s/p  PCI to LAD  . History of squamous cell carcinoma excision    right lower leg 2015 ;  2016  . Hx of colonic polyps   . Hyperlipidemia   . Hypertension   . Macular degeneration   . Osteoporosis   . PAF (paroxysmal atrial fibrillation) ([place]) dx 02-24-2016   followed by cardiologist-- dr Mamie Nick. [name]  . Pernicious anemia   . PONV (postoperative nausea and vomiting)   . Rib fracture 07/2018   left  . Right ureteral stone   . Wears glasses     Social History   Tobacco Use  . Smoking status: Former Smoker    Years: 5.00    Types: Cigarettes  . Smokeless tobacco: Never Used  . Tobacco comment: "didn't have the habit that much", just smoked "with the girls"  Substance Use Topics  . Alcohol use: No    Alcohol/week: 0.0 standard drinks  . Drug use: No    Family History  Problem Relation Age of Onset  . Coronary artery disease Other        positive family hx of  . Cancer Other  family hx of  . Brain cancer Son   . Liver cancer Daughter   . Neuropathy Neg Hx     Allergies  Allergen Reactions  . Antihistamines, Chlorpheniramine-Type Other (See Comments)    Pt states that she passed out.   [name] Penicillins Anaphylaxis and Other (See Comments)    Has patient had a PCN reaction causing immediate rash, facial/tongue/throat swelling, SOB or lightheadedness with hypotension: Yes Has patient had a PCN reaction causing severe rash involving mucus membranes or skin necrosis: No Has patient had a PCN reaction that required hospitalization No Has patient had a PCN reaction occurring within the last 10 years: No If all of the above answers are "NO", then may proceed with Cephalosporin use.  . Contrast Media [Iodinated Diagnostic Agents] Hives  . Sulfonamide Derivatives Other (See Comments)    Reaction:  Unknown   .  Atorvastatin Other (See Comments)    myalgias  . Pheniramine Other (See Comments)    Pt states that she passed out.     Physical Exam: Constitutional: in no apparent distress  Vitals:   10/30/19 0216 10/30/19 0557  BP: (!) 126/91 (!) 147/85  Pulse: 84 89  Resp: 18 18  Temp: (!) 97.3 F (36.3 C) 97.9 F (36.6 C)  SpO2: 96% 96%   EYES: anicteric Cardiovascular: Cor irreg, irreg RRR Respiratory: clear; Musculoskeletal: peripheral pulses normal, no pedal edema, no clubbing or cyanosis, right leg with erythema over most of the anterior shin area, confluent and demarcated;  Very tender to palpation.   Skin: negatives: no rash  Lab Results  Component Value Date   WBC 9.3 10/30/2019   HGB 11.5 (L) 10/30/2019   HCT 38.9 10/30/2019   MCV 98.0 10/30/2019   PLT 193 10/30/2019    Lab Results  Component Value Date   CREATININE 1.73 (H) 10/30/2019   BUN 25 (H) 10/30/2019   NA 137 10/30/2019   K 6.0 (H) 10/30/2019   CL 106 10/30/2019   CO2 23 10/30/2019    Lab Results  Component Value Date   ALT 28 10/17/2019   AST 31 10/17/2019   ALKPHOS 69 10/17/2019     Microbiology: Recent Results (from the past 240 hour(s))  Culture, blood (routine x 2)     Status: None (Preliminary result)   Collection Time: 10/29/19  7:45 AM   Specimen: Left Antecubital; Blood  Result Value Ref Range Status   Specimen Description   Final    LEFT ANTECUBITAL Performed at [facility], [place] [address], [place], [place] [address]    Special Requests   Final    BOTTLES DRAWN AEROBIC AND ANAEROBIC Blood Culture results may not be optimal due to an excessive volume of blood received in culture bottles Performed at [facility] [address], [place], [place] [address]    Culture   Final    NO GROWTH < 24 HOURS Performed at [facility] [address] [place], [place] [address]    Report Status PENDING  Incomplete  Culture, blood (routine x 2)      Status: None (Preliminary result)   Collection Time: 10/29/19  8:05 AM   Specimen: BLOOD RIGHT FOREARM  Result Value Ref Range Status   Specimen Description   Final    BLOOD RIGHT FOREARM Performed at [facility] [address] [place], [place] [address]    Special Requests   Final    BOTTLES DRAWN AEROBIC AND ANAEROBIC Blood  Culture adequate volume Performed at [facility] [address], [place], [place] [address]    Culture   Final    NO GROWTH < 24 HOURS Performed at [facility] [address], [place], [place] [address]    Report Status PENDING  Incomplete  SARS Coronavirus 2 by RT PCR (hospital order, performed in [facility] hospital lab) Nasopharyngeal Nasopharyngeal Swab     Status: None   Collection Time: 10/29/19 10:17 AM   Specimen: Nasopharyngeal Swab  Result Value Ref Range Status   SARS Coronavirus 2 NEGATIVE NEGATIVE Final    Comment: (NOTE) SARS-CoV-2 target nucleic acids are NOT DETECTED. The SARS-CoV-2 RNA is generally detectable in upper and lower respiratory specimens during the acute phase of infection. The lowest concentration of SARS-CoV-2 viral copies this assay can detect is 250 copies / mL. A negative result does not preclude SARS-CoV-2 infection and should not be used as the sole basis for treatment or other patient management decisions.  A negative result may occur with improper specimen collection / handling, submission of specimen other than nasopharyngeal swab, presence of viral mutation(s) within the areas targeted by this assay, and inadequate number of viral copies (<250 copies / mL). A negative result must be combined with clinical observations, patient history, and epidemiological information. Fact Sheet for Patients:   StrictlyIdeas.no Fact Sheet for Healthcare Providers: BankingDealers.co.za This test is not yet approved or cleared  by the  [place] FDA and has been authorized for detection and/or diagnosis of SARS-CoV-2 by FDA under an Emergency Use Authorization (EUA).  This EUA will remain in effect (meaning this test can be used) for the duration of the COVID-19 declaration under Section 564(b)(1) of the Act, 21 U.S.C. section 360bbb-3(b)(1), unless the authorization is terminated or revoked sooner. Performed at [facility], [place] [address] [place], [place] [address]     Laveyah Oriol W Sarahlynn Cisnero, MD [facility] [email or web address] 10/30/2019, 9:58 AM

## 2019-10-30 NOTE — Progress Notes (Signed)
PHARMACY NOTE -  Cefazolin  Pharmacy has been assisting with dosing of Ancef for cellulitis.  Dosage remains stable at 1g IV q12 hr and need for further dosage adjustment appears unlikely at present given renal function unlikely to improve > 30 ml/min based on age/wt/baseline SCr  Pharmacy will sign off, following peripherally for culture results or dose adjustments. Please reconsult if a change in clinical status warrants re-evaluation of dosage.  Reuel Boom, PharmD, BCPS [phone] 10/30/2019, 10:13 AM

## 2019-10-30 NOTE — Progress Notes (Signed)
Attempted to call again to give report. RN not available. Will call back in 5 mins.

## 2019-10-31 ENCOUNTER — Other Ambulatory Visit: Payer: Self-pay | Admitting: Medical

## 2019-10-31 DIAGNOSIS — Z880 Allergy status to penicillin: Secondary | ICD-10-CM

## 2019-10-31 DIAGNOSIS — R6520 Severe sepsis without septic shock: Secondary | ICD-10-CM

## 2019-10-31 DIAGNOSIS — A419 Sepsis, unspecified organism: Principal | ICD-10-CM

## 2019-10-31 LAB — BASIC METABOLIC PANEL
Anion gap: 9 (ref 5–15)
BUN: 25 mg/dL — ABNORMAL HIGH (ref 8–23)
CO2: 20 mmol/L — ABNORMAL LOW (ref 22–32)
Calcium: 8.9 mg/dL (ref 8.9–10.3)
Chloride: 104 mmol/L (ref 98–111)
Creatinine, Ser: 1.46 mg/dL — ABNORMAL HIGH (ref 0.44–1.00)
GFR calc Af Amer: 35 mL/min — ABNORMAL LOW (ref >60)
GFR calc non Af Amer: 30 mL/min — ABNORMAL LOW (ref >60)
Glucose, Bld: 88 mg/dL (ref 70–99)
Potassium: 5.2 mmol/L — ABNORMAL HIGH (ref 3.5–5.1)
Sodium: 133 mmol/L — ABNORMAL LOW (ref 135–145)

## 2019-10-31 LAB — CBC WITH DIFFERENTIAL/PLATELET
Abs Immature Granulocytes: 0.04 10*3/uL (ref 0.00–0.07)
Basophils Absolute: 0 10*3/uL (ref 0.0–0.1)
Basophils Relative: 0 %
Eosinophils Absolute: 0.1 10*3/uL (ref 0.0–0.5)
Eosinophils Relative: 1 %
HCT: 40.2 % (ref 36.0–46.0)
Hemoglobin: 11.9 g/dL — ABNORMAL LOW (ref 12.0–15.0)
Immature Granulocytes: 0 %
Lymphocytes Relative: 14 %
Lymphs Abs: 1.6 10*3/uL (ref 0.7–4.0)
MCH: 28.5 pg (ref 26.0–34.0)
MCHC: 29.6 g/dL — ABNORMAL LOW (ref 30.0–36.0)
MCV: 96.4 fL (ref 80.0–100.0)
Monocytes Absolute: 0.7 10*3/uL (ref 0.1–1.0)
Monocytes Relative: 6 %
Neutro Abs: 9 10*3/uL — ABNORMAL HIGH (ref 1.7–7.7)
Neutrophils Relative %: 79 %
Platelets: 222 10*3/uL (ref 150–400)
RBC: 4.17 MIL/uL (ref 3.87–5.11)
RDW: 16.8 % — ABNORMAL HIGH (ref 11.5–15.5)
WBC: 11.3 10*3/uL — ABNORMAL HIGH (ref 4.0–10.5)
nRBC: 0 % (ref 0.0–0.2)

## 2019-10-31 LAB — POTASSIUM
Potassium: 4.8 mmol/L (ref 3.5–5.1)
Potassium: 5.1 mmol/L (ref 3.5–5.1)
Potassium: 5.2 mmol/L — ABNORMAL HIGH (ref 3.5–5.1)
Potassium: 5.5 mmol/L — ABNORMAL HIGH (ref 3.5–5.1)
Potassium: 5.6 mmol/L — ABNORMAL HIGH (ref 3.5–5.1)
Potassium: 5.8 mmol/L — ABNORMAL HIGH (ref 3.5–5.1)

## 2019-10-31 MED ORDER — SODIUM ZIRCONIUM CYCLOSILICATE 5 G PO PACK
5.0000 g | PACK | Freq: Two times a day (BID) | ORAL | Status: AC
  Administered 2019-10-31 (×2): 5 g via ORAL
  Filled 2019-10-31 (×2): qty 1

## 2019-10-31 NOTE — Progress Notes (Addendum)
PROGRESS NOTE    NIKIRA WILLETS  [id number] DOB: 1923/02/27 DOA: 10/29/2019 PCP: [name], MD    Brief Narrative:Shelly Obrien is a 86 y.o. year old female with medical history significant for HTN, HLD, A. fib on Eliquis, CAD/MI s/pstent, chronic systolic CHF, EF 25 to [id number], severe pulmonary hypertension, CKD 3, vitamin B12 deficiency, pernicious anemia who presented on 10/29/2019 with worsening right lower extremity pain, redness, swelling over the past day.  Patient was recently hospitalized from 5/15-5/18 for cellulitis of contralateral leg (left lower extremity) and was treated with IV clindamycin with improvement and discharged on 5 additional days of [place] which she completed with no complications and noted improvement.    In the ED patient was afebrile, hemodynamically stable, slight tachypnea respiratory rate of 22, Covid test negative, lactic acid 2.  Blood cultures were obtained x2.  WBC 12.8, potassium 5.7, creatinine 1.93, BUN 27. Chest x-ray showed cardiomegaly with possible trace pleural effusions without pulmonary edema. X-ray of tibia/fibula on right showed diffuse soft tissue swelling. CT of right tibia/fibula showed extensive circumferential skin thickening/soft tissue swelling consistent with cellulitis with no abscess or acute osseous abnormality. Patient received IV clindamycin in the ED.  TRH was called for further management. Assessment & Plan:   Active Problems:   CAD (coronary artery disease)   CKD (chronic kidney disease) stage 3, GFR 30-59 ml/min (HCC)   Paroxysmal atrial fibrillation (HCC)   Cardiomyopathy (HCC)   Systolic CHF (HCC)   Longstanding persistent atrial fibrillation (HCC)   Cellulitis   Acute kidney injury superimposed on CKD (HCC)   Cellulitis of right leg   Hyperkalemia   SIRS (systemic inflammatory response syndrome) (HCC)   Nonpurulent Cellulitis of Right lower extremity with SIRS. Right lower extremity still remains very erythematous  and tender.  Left lower extremity with decreased erythema more cooler to touch compared to right lower extremity.  Failed outpatient treatment with clindamycin.  CT imaging confirms SSTI without abscess or bone involvement.  No clear nidus, had leukocytosis and tachypnea  but no fever or other signs of sepsis, venous Doppler negative for DVT -Appreciate ID recommendations, given concern for streptococcal infection we will transition to IV cefazolin -Continue to monitor rash (skin markings placed per nursing), imaging documented in chart -Daily CBC -Monitor blood cultures  AKI on CKD stage III B with hyperkalemia.  In setting of ongoing infection.  Baseline creatinine 1-1 0.6.  1.9 on admission, slowly improving.   Potassium 5.2. -Avoid nephrotoxins -Monitor output -UA for evaluation -Lokelma 2 doses follow-up labs in a.m.  Chronic systolic CHF with severe pulmonary hypertension.  EF 25-30% (TTE, 07/2019).  Aside from right lower extremity swelling related to cellulitis looks euvolemic on exam.  She does report worsening DOE over the past week.  Chest x-ray on admission shows trace pleural effusions per report taken off Lasix by cardiologist due to recent AKI. -Daily weights -Strict I's and O's -Low-salt diet, fluid restriction -continue home Lopressor  Persistent atrial fibrillation, rate controlled , previously on amiodarone now only on rate control with anticoagulation.  Cardioversion a few months ago this year -Continue home Lopressor 50 mg twice daily -continue Eliquis 5 mg twice daily   CAD status post remote PCI ,asymptomatic -no statin or aspirin at home was  Dysphagia with liquids.  Esophagram last admission showed esophageal dysmotility.  GI the time advised it is more amounts of liquids and sitting upright allow gravity to help -Speech therapy evaluation ordered on admission  Pernicious anemia.  Hemoglobin stable at baseline 11-12.  No localizing signs or symptoms of  bleeding. Not on vitamin b12. MCV wnl.     Estimated body mass index is 27.96 kg/m as calculated from the following:   Height as of this encounter: 5\' 5"  (1.651 m).   Weight as of this encounter: 76.2 kg.  DVT prophylaxis: Eliquis Code Status: DO NOT RESUSCITATE Family Communication: Will discuss with family Disposition Plan:  Status is: Inpatient  Dispo: The patient is from: Home              Anticipated d/c is to: Home              Anticipated d/c date is: 2 days              Patient currently is not medically stable to d/c. Patient on IV antibiotics for severe recurrent right lower extremity cellulitis on IV cefazolin. Followed by infectious disease.   Consultants: ID   Procedures: Venous Doppler 10/29/2019 Antimicrobials- Anti-infectives (From admission, onward)   Start     Dose/Rate Route Frequency Ordered Stop   10/30/19 1100  ceFAZolin (ANCEF) IVPB 1 g/50 mL premix     1 g 100 mL/hr over 30 Minutes Intravenous Every 12 hours 10/30/19 1012     10/29/19 1600  clindamycin (CLEOCIN) IVPB 600 mg  Status:  Discontinued     600 mg 100 mL/hr over 30 Minutes Intravenous Every 8 hours 10/29/19 1421 10/30/19 0936   10/29/19 0715  clindamycin (CLEOCIN) IVPB 600 mg     600 mg 100 mL/hr over 30 Minutes Intravenous  Once 10/29/19 0705 10/29/19 1000      Subjective:  Patient is awake alert concerned about A. fib Eliquis cellulitis answers all her questions. Objective: Vitals:   10/30/19 0557 10/30/19 1309 10/30/19 2015 10/31/19 0545  BP: (!) 147/85 (!) 119/96 (!) 140/102 (!) 133/97  Pulse: 89 93 100 100  Resp: 18 18 16 16   Temp: 97.9 F (36.6 C) (!) 97.4 F (36.3 C) 97.7 F (36.5 C) 98.4 F (36.9 C)  TempSrc: Oral Axillary Oral Oral  SpO2: 96% 95% 94% 91%  Weight:    76.2 kg  Height:        Intake/Output Summary (Last 24 hours) at 10/31/2019 1138 Last data filed at 10/30/2019 1800 Gross per 24 hour  Intake 442.64 ml  Output 500 ml  Net -57.36 ml   Filed Weights    10/29/19 0607 10/31/19 0545  Weight: 70 kg 76.2 kg    Examination:  General exam: Appears calm and comfortable  Respiratory system: Clear to auscultation. Respiratory effort normal. Cardiovascular system: S1 & S2 heard, RRR. No JVD, murmurs, rubs, gallops or clicks. No pedal edema. Gastrointestinal system: Abdomen is nondistended, soft and nontender. No organomegaly or masses felt. Normal bowel sounds heard. Central nervous system: Alert and oriented. No focal neurological deficits. Extremities: Right foot below knee erythema tenderness and swelling worse on the right than left pulses felt in both feet however left feet more cooler than the right Skin: No rashes, lesions or ulcers Psychiatry: Judgement and insight appear normal. Mood & affect appropriate.     Data Reviewed: I have personally reviewed following labs and imaging studies  CBC: Recent Labs  Lab 10/29/19 0644 10/30/19 0438 10/31/19 0423  WBC 12.8* 9.3 11.3*  NEUTROABS 11.0* 7.4 9.0*  HGB 12.7 11.5* 11.9*  HCT 42.9 38.9 40.2  MCV 96.8 98.0 96.4  PLT 246 193 [id number]   Basic Metabolic Panel: Recent Labs  Lab 10/29/19 0644 10/29/19 [id number] 10/30/19 0438 10/30/19 0438 10/30/19 0808 10/30/19 1147 10/30/19 1520 10/30/19 1932 10/30/19 2347 10/31/19 0423 10/31/19 0721  NA 138  --  138  --  137  --   --   --   --  133*  --   K 5.7*   < > 6.0*   < > 6.0*   < > 6.0* 6.3* 5.5* 5.2*  5.8* 5.2*  CL 104  --  108  --  106  --   --   --   --  104  --   CO2 22  --  24  --  23  --   --   --   --  20*  --   GLUCOSE 121*  --  104*  --  108*  --   --   --   --  88  --   BUN 27*  --  24*  --  25*  --   --   --   --  25*  --   CREATININE 1.93*  --  1.62*  --  1.73*  --   --   --   --  1.46*  --   CALCIUM 9.4  --  8.6*  --  9.0  --   --   --   --  8.9  --    < > = values in this interval not displayed.   GFR: Estimated Creatinine Clearance: 23.5 mL/min (A) (by C-G formula based on SCr of 1.46 mg/dL (H)). Liver Function Tests:  No results for input(s): AST, ALT, ALKPHOS, BILITOT, PROT, ALBUMIN in the last 168 hours. No results for input(s): LIPASE, AMYLASE in the last 168 hours. No results for input(s): AMMONIA in the last 168 hours. Coagulation Profile: No results for input(s): INR, PROTIME in the last 168 hours. Cardiac Enzymes: No results for input(s): CKTOTAL, CKMB, CKMBINDEX, TROPONINI in the last 168 hours. BNP (last 3 results) No results for input(s): PROBNP in the last 8760 hours. HbA1C: No results for input(s): HGBA1C in the last 72 hours. CBG: No results for input(s): GLUCAP in the last 168 hours. Lipid Profile: No results for input(s): CHOL, HDL, LDLCALC, TRIG, CHOLHDL, LDLDIRECT in the last 72 hours. Thyroid Function Tests: No results for input(s): TSH, T4TOTAL, FREET4, T3FREE, THYROIDAB in the last 72 hours. Anemia Panel: No results for input(s): VITAMINB12, FOLATE, FERRITIN, TIBC, IRON, RETICCTPCT in the last 72 hours. Sepsis Labs: Recent Labs  Lab 10/29/19 0756 10/29/19 1015  LATICACIDVEN 2.0* 1.6    Recent Results (from the past 240 hour(s))  Culture, blood (routine x 2)     Status: None (Preliminary result)   Collection Time: 10/29/19  7:45 AM   Specimen: Left Antecubital; Blood  Result Value Ref Range Status   Specimen Description   Final    LEFT ANTECUBITAL Performed at [facility] [address], [place], [place] [address]    Special Requests   Final    BOTTLES DRAWN AEROBIC AND ANAEROBIC Blood Culture results may not be optimal due to an excessive volume of blood received in culture bottles Performed at [facility] [address], [place], [place] [address]    Culture   Final    NO GROWTH 2 DAYS Performed at [facility] [address], [place], [place] [address]    Report Status PENDING  Incomplete  Culture, blood (routine x 2)     Status: None (Preliminary result)  Collection Time: 10/29/19  8:05 AM   Specimen: BLOOD  RIGHT FOREARM  Result Value Ref Range Status   Specimen Description   Final    BLOOD RIGHT FOREARM Performed at [facility] [address] [place], [place] [address]    Special Requests   Final    BOTTLES DRAWN AEROBIC AND ANAEROBIC Blood Culture adequate volume Performed at [facility] [address] [place], [place] [address]    Culture   Final    NO GROWTH 2 DAYS Performed at [facility] [address], [place], [place] [address]    Report Status PENDING  Incomplete  SARS Coronavirus 2 by RT PCR (hospital order, performed in [facility] hospital lab) Nasopharyngeal Nasopharyngeal Swab     Status: None   Collection Time: 10/29/19 10:17 AM   Specimen: Nasopharyngeal Swab  Result Value Ref Range Status   SARS Coronavirus 2 NEGATIVE NEGATIVE Final    Comment: (NOTE) SARS-CoV-2 target nucleic acids are NOT DETECTED. The SARS-CoV-2 RNA is generally detectable in upper and lower respiratory specimens during the acute phase of infection. The lowest concentration of SARS-CoV-2 viral copies this assay can detect is 250 copies / mL. A negative result does not preclude SARS-CoV-2 infection and should not be used as the sole basis for treatment or other patient management decisions.  A negative result may occur with improper specimen collection / handling, submission of specimen other than nasopharyngeal swab, presence of viral mutation(s) within the areas targeted by this assay, and inadequate number of viral copies (<250 copies / mL). A negative result must be combined with clinical observations, patient history, and epidemiological information. Fact Sheet for Patients:   StrictlyIdeas.no Fact Sheet for Healthcare Providers: BankingDealers.co.za This test is not yet approved or cleared  by the [place] FDA and has been authorized for detection and/or diagnosis of SARS-CoV-2 by FDA  under an Emergency Use Authorization (EUA).  This EUA will remain in effect (meaning this test can be used) for the duration of the COVID-19 declaration under Section 564(b)(1) of the Act, 21 U.S.C. section 360bbb-3(b)(1), unless the authorization is terminated or revoked sooner. Performed at [facility], [place] [address], [place], [place] [address]          Radiology Studies: No results found.      Scheduled Meds: . acetaminophen  1,000 mg Oral Q8H  . acidophilus  1 capsule Oral BID  . apixaban  2.5 mg Oral BID  . metoprolol tartrate  50 mg Oral BID  . senna  1 tablet Oral BID  . sodium chloride flush  3 mL Intravenous Q12H   Continuous Infusions: . sodium chloride 10 mL/hr at 10/30/19 1800  .  ceFAZolin (ANCEF) IV 1 g (10/31/19 1046)     LOS: 2 days     Georgette Shell, MD  10/31/2019, 11:38 AM

## 2019-10-31 NOTE — Progress Notes (Signed)
Subjective: "when do I get to go home?"   Antibiotics:  Anti-infectives (From admission, onward)   Start     Dose/Rate Route Frequency Ordered Stop   10/30/19 1100  ceFAZolin (ANCEF) IVPB 1 g/50 mL premix     1 g 100 mL/hr over 30 Minutes Intravenous Every 12 hours 10/30/19 1012     10/29/19 1600  clindamycin (CLEOCIN) IVPB 600 mg  Status:  Discontinued     600 mg 100 mL/hr over 30 Minutes Intravenous Every 8 hours 10/29/19 1421 10/30/19 0936   10/29/19 0715  clindamycin (CLEOCIN) IVPB 600 mg     600 mg 100 mL/hr over 30 Minutes Intravenous  Once 10/29/19 0705 10/29/19 1000      Medications: Scheduled Meds: . acetaminophen  1,000 mg Oral Q8H  . acidophilus  1 capsule Oral BID  . apixaban  2.5 mg Oral BID  . metoprolol tartrate  50 mg Oral BID  . senna  1 tablet Oral BID  . sodium chloride flush  3 mL Intravenous Q12H  . sodium zirconium cyclosilicate  5 g Oral BID   Continuous Infusions: . sodium chloride 10 mL/hr at 10/30/19 1800  .  ceFAZolin (ANCEF) IV 1 g (10/31/19 1046)   PRN Meds:.sodium chloride, acetaminophen **OR** acetaminophen, hydrALAZINE, HYDROcodone-acetaminophen, magnesium hydroxide, ondansetron **OR** ondansetron (ZOFRAN) IV, sorbitol    Objective: Weight change:   Intake/Output Summary (Last 24 hours) at 10/31/2019 1315 Last data filed at 10/30/2019 1800 Gross per 24 hour  Intake 392.64 ml  Output 500 ml  Net -107.36 ml   Blood pressure (!) 129/105, pulse 98, temperature 97.8 F (36.6 C), temperature source Oral, resp. rate 20, height 5\' 5"  (1.651 m), weight 76.2 kg, SpO2 93 %. Temp:  [97.7 F (36.5 C)-98.4 F (36.9 C)] 97.8 F (36.6 C) (05/29 1204) Pulse Rate:  [98-100] 98 (05/29 1204) Resp:  [16-20] 20 (05/29 1204) BP: (129-140)/(97-105) 129/105 (05/29 1204) SpO2:  [91 %-94 %] 93 % (05/29 1204) Weight:  [76.2 kg] 76.2 kg (05/29 0545)  Physical Exam: General: Alert and awake, oriented x3, not in any acute distress. HEENT:  anicteric sclera, EOMI CVS regular rate, normal  Chest: , no wheezing, no respiratory distress Abdomen: soft non-distended,   Right LE   10/30/2019:         10/31/2019:      Neuro: nonfocal  CBC:    BMET Recent Labs    10/30/19 0808 10/30/19 1147 10/31/19 0423 10/31/19 0423 10/31/19 0721 10/31/19 1129  NA 137  --  133*  --   --   --   K 6.0*   < > 5.2*  5.8*   < > 5.2* 5.6*  CL 106  --  104  --   --   --   CO2 23  --  20*  --   --   --   GLUCOSE 108*  --  88  --   --   --   BUN 25*  --  25*  --   --   --   CREATININE 1.73*  --  1.46*  --   --   --   CALCIUM 9.0  --  8.9  --   --   --    < > = values in this interval not displayed.     Liver Panel  No results for input(s): PROT, ALBUMIN, AST, ALT, ALKPHOS, BILITOT, BILIDIR, IBILI in the last 72 hours.     Sedimentation  Rate No results for input(s): ESRSEDRATE in the last 72 hours. C-Reactive Protein No results for input(s): CRP in the last 72 hours.  Micro Results: Recent Results (from the past 720 hour(s))  SARS Coronavirus 2 by RT PCR (hospital order, performed in [facility] hospital lab) Nasopharyngeal Nasopharyngeal Swab     Status: None   Collection Time: 10/17/19 11:20 AM   Specimen: Nasopharyngeal Swab  Result Value Ref Range Status   SARS Coronavirus 2 NEGATIVE NEGATIVE Final    Comment: (NOTE) SARS-CoV-2 target nucleic acids are NOT DETECTED. The SARS-CoV-2 RNA is generally detectable in upper and lower respiratory specimens during the acute phase of infection. The lowest concentration of SARS-CoV-2 viral copies this assay can detect is 250 copies / mL. A negative result does not preclude SARS-CoV-2 infection and should not be used as the sole basis for treatment or other patient management decisions.  A negative result may occur with improper specimen collection / handling, submission of specimen other than nasopharyngeal swab, presence of viral mutation(s) within the areas  targeted by this assay, and inadequate number of viral copies (<250 copies / mL). A negative result must be combined with clinical observations, patient history, and epidemiological information. Fact Sheet for Patients:   StrictlyIdeas.no Fact Sheet for Healthcare Providers: BankingDealers.co.za This test is not yet approved or cleared  by the [place] FDA and has been authorized for detection and/or diagnosis of SARS-CoV-2 by FDA under an Emergency Use Authorization (EUA).  This EUA will remain in effect (meaning this test can be used) for the duration of the COVID-19 declaration under Section 564(b)(1) of the Act, 21 U.S.C. section 360bbb-3(b)(1), unless the authorization is terminated or revoked sooner. Performed at [facility], [place] [address] [place], [place] [address]   Culture, blood (routine x 2)     Status: None   Collection Time: 10/17/19  4:40 PM   Specimen: BLOOD RIGHT HAND  Result Value Ref Range Status   Specimen Description BLOOD RIGHT HAND  Final   Special Requests   Final    BOTTLES DRAWN AEROBIC AND ANAEROBIC Blood Culture results may not be optimal due to an inadequate volume of blood received in culture bottles   Culture   Final    NO GROWTH 5 DAYS Performed at [facility], [place] [address], [place], [place] [address]    Report Status 10/22/2019 FINAL  Final  Culture, blood (routine x 2)     Status: None   Collection Time: 10/17/19  4:45 PM   Specimen: BLOOD  Result Value Ref Range Status   Specimen Description BLOOD LEFT ANTECUBITAL  Final   Special Requests   Final    BOTTLES DRAWN AEROBIC AND ANAEROBIC Blood Culture adequate volume   Culture   Final    NO GROWTH 5 DAYS Performed at [facility], [place] [address], [place], [place] [address]    Report Status 10/22/2019 FINAL  Final  Culture, blood (routine x 2)     Status: None (Preliminary result)   Collection Time: 10/29/19  7:45 AM    Specimen: Left Antecubital; Blood  Result Value Ref Range Status   Specimen Description   Final    LEFT ANTECUBITAL Performed at [place] [address] [place], [place] [address]    Special Requests   Final    BOTTLES DRAWN AEROBIC AND ANAEROBIC Blood Culture results may not be optimal due to an excessive volume of blood received in culture  bottles Performed at [place] [address] [place], [place] [address]    Culture   Final    NO GROWTH 2 DAYS Performed at [place] [address], [place], [place] [address]    Report Status PENDING  Incomplete  Culture, blood (routine x 2)     Status: None (Preliminary result)   Collection Time: 10/29/19  8:05 AM   Specimen: BLOOD RIGHT FOREARM  Result Value Ref Range Status   Specimen Description   Final    BLOOD RIGHT FOREARM Performed at [place] [address], [place], [place] [address]    Special Requests   Final    BOTTLES DRAWN AEROBIC AND ANAEROBIC Blood Culture adequate volume Performed at [place] [address] [place], [place] [address]    Culture   Final    NO GROWTH 2 DAYS Performed at [place] [address], [place], [place] [address]    Report Status PENDING  Incomplete  SARS Coronavirus 2 by RT PCR (hospital order, performed in [facility] hospital lab) Nasopharyngeal Nasopharyngeal Swab     Status: None   Collection Time: 10/29/19 10:17 AM   Specimen: Nasopharyngeal Swab  Result Value Ref Range Status   SARS Coronavirus 2 NEGATIVE NEGATIVE Final    Comment: (NOTE) SARS-CoV-2 target nucleic acids are NOT DETECTED. The SARS-CoV-2 RNA is generally detectable in upper and lower respiratory specimens during the acute phase of infection. The lowest concentration of SARS-CoV-2 viral copies this assay can detect is 250 copies / mL. A negative result does not preclude SARS-CoV-2 infection and should not  be used as the sole basis for treatment or other patient management decisions.  A negative result may occur with improper specimen collection / handling, submission of specimen other than nasopharyngeal swab, presence of viral mutation(s) within the areas targeted by this assay, and inadequate number of viral copies (<250 copies / mL). A negative result must be combined with clinical observations, patient history, and epidemiological information. Fact Sheet for Patients:   StrictlyIdeas.no Fact Sheet for Healthcare Providers: BankingDealers.co.za This test is not yet approved or cleared  by the [place] FDA and has been authorized for detection and/or diagnosis of SARS-CoV-2 by FDA under an Emergency Use Authorization (EUA).  This EUA will remain in effect (meaning this test can be used) for the duration of the COVID-19 declaration under Section 564(b)(1) of the Act, 21 U.S.C. section 360bbb-3(b)(1), unless the authorization is terminated or revoked sooner. Performed at [facility], [place] [address] [place], [place] [address]     Studies/Results: No results found.    Assessment/Plan:  INTERVAL HISTORY: tolerating cefazolin   Active Problems:   CAD (coronary artery disease)   CKD (chronic kidney disease) stage 3, GFR 30-59 ml/min (HCC)   Paroxysmal atrial fibrillation (HCC)   Cardiomyopathy (HCC)   Systolic CHF (HCC)   Longstanding persistent atrial fibrillation (HCC)   Cellulitis   Acute kidney injury superimposed on CKD (HCC)   Cellulitis of right leg   Hyperkalemia   SIRS (systemic inflammatory response syndrome) (HCC)   Severe sepsis ([place])    Shelly Obrien is a 86 y.o. female with R leg non purulent cellulitis and PCN allergy  #1 Cellulitis:   Continue ancef while in house and when ready can change to keflex to complete 10 day course  Elevate the leg     LOS: 2 days   Rhina Brackett  [place] 10/31/2019, 1:15 PM

## 2019-11-01 LAB — CBC WITH DIFFERENTIAL/PLATELET
Abs Immature Granulocytes: 0.04 10*3/uL (ref 0.00–0.07)
Basophils Absolute: 0 10*3/uL (ref 0.0–0.1)
Basophils Relative: 0 %
Eosinophils Absolute: 0.1 10*3/uL (ref 0.0–0.5)
Eosinophils Relative: 1 %
HCT: 40.7 % (ref 36.0–46.0)
Hemoglobin: 12 g/dL (ref 12.0–15.0)
Immature Granulocytes: 0 %
Lymphocytes Relative: 12 %
Lymphs Abs: 1.2 10*3/uL (ref 0.7–4.0)
MCH: 28.1 pg (ref 26.0–34.0)
MCHC: 29.5 g/dL — ABNORMAL LOW (ref 30.0–36.0)
MCV: 95.3 fL (ref 80.0–100.0)
Monocytes Absolute: 0.9 10*3/uL (ref 0.1–1.0)
Monocytes Relative: 9 %
Neutro Abs: 7.6 10*3/uL (ref 1.7–7.7)
Neutrophils Relative %: 78 %
Platelets: 247 10*3/uL (ref 150–400)
RBC: 4.27 MIL/uL (ref 3.87–5.11)
RDW: 17 % — ABNORMAL HIGH (ref 11.5–15.5)
WBC: 9.8 10*3/uL (ref 4.0–10.5)
nRBC: 0 % (ref 0.0–0.2)

## 2019-11-01 LAB — BASIC METABOLIC PANEL
Anion gap: 7 (ref 5–15)
BUN: 25 mg/dL — ABNORMAL HIGH (ref 8–23)
CO2: 26 mmol/L (ref 22–32)
Calcium: 9 mg/dL (ref 8.9–10.3)
Chloride: 106 mmol/L (ref 98–111)
Creatinine, Ser: 1.51 mg/dL — ABNORMAL HIGH (ref 0.44–1.00)
GFR calc Af Amer: 34 mL/min — ABNORMAL LOW (ref >60)
GFR calc non Af Amer: 29 mL/min — ABNORMAL LOW (ref >60)
Glucose, Bld: 110 mg/dL — ABNORMAL HIGH (ref 70–99)
Potassium: 4.8 mmol/L (ref 3.5–5.1)
Sodium: 139 mmol/L (ref 135–145)

## 2019-11-01 LAB — POTASSIUM
Potassium: 4.4 mmol/L (ref 3.5–5.1)
Potassium: 4.8 mmol/L (ref 3.5–5.1)
Potassium: 4.8 mmol/L (ref 3.5–5.1)
Potassium: 5.1 mmol/L (ref 3.5–5.1)
Potassium: 5.3 mmol/L — ABNORMAL HIGH (ref 3.5–5.1)

## 2019-11-01 MED ORDER — SODIUM ZIRCONIUM CYCLOSILICATE 5 G PO PACK
5.0000 g | PACK | Freq: Two times a day (BID) | ORAL | Status: AC
  Administered 2019-11-01 – 2019-11-02 (×4): 5 g via ORAL
  Filled 2019-11-01 (×4): qty 1

## 2019-11-01 NOTE — Progress Notes (Signed)
Subjective:  No new complaints   Antibiotics:  Anti-infectives (From admission, onward)   Start     Dose/Rate Route Frequency Ordered Stop   10/30/19 1100  ceFAZolin (ANCEF) IVPB 1 g/50 mL premix     1 g 100 mL/hr over 30 Minutes Intravenous Every 12 hours 10/30/19 1012     10/29/19 1600  clindamycin (CLEOCIN) IVPB 600 mg  Status:  Discontinued     600 mg 100 mL/hr over 30 Minutes Intravenous Every 8 hours 10/29/19 1421 10/30/19 0936   10/29/19 0715  clindamycin (CLEOCIN) IVPB 600 mg     600 mg 100 mL/hr over 30 Minutes Intravenous  Once 10/29/19 0705 10/29/19 1000      Medications: Scheduled Meds: . acetaminophen  1,000 mg Oral Q8H  . acidophilus  1 capsule Oral BID  . apixaban  2.5 mg Oral BID  . metoprolol tartrate  50 mg Oral BID  . senna  1 tablet Oral BID  . sodium chloride flush  3 mL Intravenous Q12H  . sodium zirconium cyclosilicate  5 g Oral BID   Continuous Infusions: . sodium chloride 10 mL/hr at 10/30/19 1800  .  ceFAZolin (ANCEF) IV 1 g (11/01/19 0820)   PRN Meds:.sodium chloride, acetaminophen **OR** acetaminophen, hydrALAZINE, HYDROcodone-acetaminophen, magnesium hydroxide, ondansetron **OR** ondansetron (ZOFRAN) IV, sorbitol    Objective: Weight change:   Intake/Output Summary (Last 24 hours) at 11/01/2019 1606 Last data filed at 11/01/2019 0831 Gross per 24 hour  Intake 360 ml  Output 300 ml  Net 60 ml   Blood pressure 133/90, pulse 89, temperature 98.1 F (36.7 C), resp. rate 16, height 5\' 5"  (1.651 m), weight 76.2 kg, SpO2 96 %. Temp:  [98.1 F (36.7 C)-98.5 F (36.9 C)] 98.1 F (36.7 C) (05/30 1218) Pulse Rate:  [71-92] 89 (05/30 1218) Resp:  [16] 16 (05/30 1218) BP: (124-142)/(88-108) 133/90 (05/30 1218) SpO2:  [95 %-96 %] 96 % (05/30 1218)  Physical Exam: General: Alert and awake, oriented x3, not in any acute distress. HEENT: anicteric sclera, EOMI CVS regular rate, normal  Chest: , no wheezing, no respiratory  distress Abdomen: soft non-distended,   Right LE   10/30/2019:         10/31/2019:    11/01/2019:      Neuro: nonfocal  CBC:    BMET Recent Labs    10/31/19 0423 10/31/19 [id number] 11/01/19 0356 11/01/19 0356 11/01/19 0758 11/01/19 1124  NA 133*  --  139  --   --   --   K 5.2*  5.8*   < > 4.8   < > 5.3* 4.8  CL 104  --  106  --   --   --   CO2 20*  --  26  --   --   --   GLUCOSE 88  --  110*  --   --   --   BUN 25*  --  25*  --   --   --   CREATININE 1.46*  --  1.51*  --   --   --   CALCIUM 8.9  --  9.0  --   --   --    < > = values in this interval not displayed.     Liver Panel  No results for input(s): PROT, ALBUMIN, AST, ALT, ALKPHOS, BILITOT, BILIDIR, IBILI in the last 72 hours.     Sedimentation Rate No results for input(s): ESRSEDRATE in the last 72 hours.  C-Reactive Protein No results for input(s): CRP in the last 72 hours.  Micro Results: Recent Results (from the past 720 hour(s))  SARS Coronavirus 2 by RT PCR (hospital order, performed in [facility] hospital lab) Nasopharyngeal Nasopharyngeal Swab     Status: None   Collection Time: 10/17/19 11:20 AM   Specimen: Nasopharyngeal Swab  Result Value Ref Range Status   SARS Coronavirus 2 NEGATIVE NEGATIVE Final    Comment: (NOTE) SARS-CoV-2 target nucleic acids are NOT DETECTED. The SARS-CoV-2 RNA is generally detectable in upper and lower respiratory specimens during the acute phase of infection. The lowest concentration of SARS-CoV-2 viral copies this assay can detect is 250 copies / mL. A negative result does not preclude SARS-CoV-2 infection and should not be used as the sole basis for treatment or other patient management decisions.  A negative result may occur with improper specimen collection / handling, submission of specimen other than nasopharyngeal swab, presence of viral mutation(s) within the areas targeted by this assay, and inadequate number of viral copies (<250 copies /  mL). A negative result must be combined with clinical observations, patient history, and epidemiological information. Fact Sheet for Patients:   StrictlyIdeas.no Fact Sheet for Healthcare Providers: BankingDealers.co.za This test is not yet approved or cleared  by the [place] FDA and has been authorized for detection and/or diagnosis of SARS-CoV-2 by FDA under an Emergency Use Authorization (EUA).  This EUA will remain in effect (meaning this test can be used) for the duration of the COVID-19 declaration under Section 564(b)(1) of the Act, 21 U.S.C. section 360bbb-3(b)(1), unless the authorization is terminated or revoked sooner. Performed at [facility], [place] [address] [place], [place] [address]   Culture, blood (routine x 2)     Status: None   Collection Time: 10/17/19  4:40 PM   Specimen: BLOOD RIGHT HAND  Result Value Ref Range Status   Specimen Description BLOOD RIGHT HAND  Final   Special Requests   Final    BOTTLES DRAWN AEROBIC AND ANAEROBIC Blood Culture results may not be optimal due to an inadequate volume of blood received in culture bottles   Culture   Final    NO GROWTH 5 DAYS Performed at [facility], [place] [address] [place], [place] [address]    Report Status 10/22/2019 FINAL  Final  Culture, blood (routine x 2)     Status: None   Collection Time: 10/17/19  4:45 PM   Specimen: BLOOD  Result Value Ref Range Status   Specimen Description BLOOD LEFT ANTECUBITAL  Final   Special Requests   Final    BOTTLES DRAWN AEROBIC AND ANAEROBIC Blood Culture adequate volume   Culture   Final    NO GROWTH 5 DAYS Performed at [facility], [place] [address], [place], [place] [address]    Report Status 10/22/2019 FINAL  Final  Culture, blood (routine x 2)     Status: None (Preliminary result)   Collection Time: 10/29/19  7:45 AM   Specimen: Left Antecubital; Blood  Result Value Ref Range Status   Specimen  Description   Final    LEFT ANTECUBITAL Performed at [place] [address] [place], [place] [address]    Special Requests   Final    BOTTLES DRAWN AEROBIC AND ANAEROBIC Blood Culture results may not be optimal due to an excessive volume of blood received in culture bottles Performed at [place] [address]  [place], [place] [address]    Culture   Final    NO GROWTH 3 DAYS Performed at [facility], [place] [address] [place], [place] [address]    Report Status PENDING  Incomplete  Culture, blood (routine x 2)     Status: None (Preliminary result)   Collection Time: 10/29/19  8:05 AM   Specimen: BLOOD RIGHT FOREARM  Result Value Ref Range Status   Specimen Description   Final    BLOOD RIGHT FOREARM Performed at [place] [address], [place], [place] [address]    Special Requests   Final    BOTTLES DRAWN AEROBIC AND ANAEROBIC Blood Culture adequate volume Performed at [place] [address] [place], [place] [address]    Culture   Final    NO GROWTH 3 DAYS Performed at [facility], [place] [address], [place], [place] [address]    Report Status PENDING  Incomplete  SARS Coronavirus 2 by RT PCR (hospital order, performed in [facility] hospital lab) Nasopharyngeal Nasopharyngeal Swab     Status: None   Collection Time: 10/29/19 10:17 AM   Specimen: Nasopharyngeal Swab  Result Value Ref Range Status   SARS Coronavirus 2 NEGATIVE NEGATIVE Final    Comment: (NOTE) SARS-CoV-2 target nucleic acids are NOT DETECTED. The SARS-CoV-2 RNA is generally detectable in upper and lower respiratory specimens during the acute phase of infection. The lowest concentration of SARS-CoV-2 viral copies this assay can detect is 250 copies / mL. A negative result does not preclude SARS-CoV-2 infection and should not be used as the sole basis for treatment or other patient management  decisions.  A negative result may occur with improper specimen collection / handling, submission of specimen other than nasopharyngeal swab, presence of viral mutation(s) within the areas targeted by this assay, and inadequate number of viral copies (<250 copies / mL). A negative result must be combined with clinical observations, patient history, and epidemiological information. Fact Sheet for Patients:   StrictlyIdeas.no Fact Sheet for Healthcare Providers: BankingDealers.co.za This test is not yet approved or cleared  by the [place] FDA and has been authorized for detection and/or diagnosis of SARS-CoV-2 by FDA under an Emergency Use Authorization (EUA).  This EUA will remain in effect (meaning this test can be used) for the duration of the COVID-19 declaration under Section 564(b)(1) of the Act, 21 U.S.C. section 360bbb-3(b)(1), unless the authorization is terminated or revoked sooner. Performed at [facility], [place] [address], [place], [place] [address]     Studies/Results: No results found.    Assessment/Plan:  INTERVAL HISTORY: tolerating cefazolin   Active Problems:   CAD (coronary artery disease)   CKD (chronic kidney disease) stage 3, GFR 30-59 ml/min (HCC)   Paroxysmal atrial fibrillation (HCC)   Cardiomyopathy (HCC)   Systolic CHF (HCC)   Longstanding persistent atrial fibrillation (HCC)   Cellulitis   Acute kidney injury superimposed on CKD (HCC)   Cellulitis of right leg   Hyperkalemia   SIRS (systemic inflammatory response syndrome) (HCC)   Severe sepsis (HCC)    CICLALY CAPATI is a 86 y.o. female with R leg non purulent cellulitis and PCN allergy  #1 Cellulitis:   Continue ancef while in house and when ready can change to keflex to complete 10 day course  Elevate the leg  I am signing off.  Please call with further questions.      LOS: 3 days   Roderic Scarce  Tommy Medal 11/01/2019, 4:06 PM

## 2019-11-01 NOTE — Evaluation (Signed)
Physical Therapy Evaluation Patient Details Name: Shelly Obrien MRN: [id number] DOB: [birth year] Today's Date: 11/01/2019   History of Present Illness  86yo female with medical history significant for HTN, HLD, A. fib, CAD/MI s/p stent, chronic systolic CHF, EF 123XX123, severe pulmonary hypertension, CKD 3, vitamin B12 deficiency, pernicious anemia who presented on 10/29/2019 with worsening right lower extremity pain, redness, swelling. Patient was recently hospitalized from 5/15-5/18 for cellulitis of contralateral leg (left lower extremity) CT findings consistent with cellulitis  Clinical Impression  Pt admitted with above diagnosis.  Pt is quite independent at her baseline. She is aware that her family would like her to go to rehab, pt states she doesn't really want to do that. Pt amb short distance today in room, fatigues easily however only requiring min to min-guard assist. Dyspneic after amb ~ 30', HR 90 to 116 max, SpO2= 96% on RA. Likely will be able to go home with HHPT, however pt has had 3 recent admissions, fatigues quickly and may benefit from STSNF as per family wishes.   Pt currently with functional limitations due to the deficits listed below (see PT Problem List). Pt will benefit from skilled PT to increase their independence and safety with mobility to allow discharge to the venue listed below.       Follow Up Recommendations SNF;Home health PT(depending on progress)    Equipment Recommendations  None recommended by PT    Recommendations for Other Services       Precautions / Restrictions Precautions Precautions: Fall Restrictions Weight Bearing Restrictions: No      Mobility  Bed Mobility               General bed mobility comments: pt up in recliner  Transfers Overall transfer level: Needs assistance Equipment used: Rolling walker (2 wheeled) Transfers: Sit to/from Stand Sit to Stand: Min guard         General transfer comment: min guard for safety  only  Ambulation/Gait Ambulation/Gait assistance: Min guard;Min assist Gait Distance (Feet): 30 Feet(in room distance d/t fatigue and pt request) Assistive device: Rolling walker (2 wheeled) Gait Pattern/deviations: Step-through pattern;Decreased stride length;Wide base of support     General Gait Details: min assist for turns, min/guard for safety. no overt LOB noted  Stairs            Wheelchair Mobility    Modified Rankin (Stroke Patients Only)       Balance Overall balance assessment: Needs assistance Sitting-balance support: Feet supported;No upper extremity supported Sitting balance-Leahy Scale: Good       Standing balance-Leahy Scale: Poor Standing balance comment: pt requires UE support d/t RLE pain with WBing                             Pertinent Vitals/Pain Pain Assessment: Faces Faces Pain Scale: Hurts little more Pain Location: RLE Pain Descriptors / Indicators: Grimacing;Sore Pain Intervention(s): Monitored during session;Limited activity within patient's tolerance;Repositioned    Home Living Family/patient expects to be discharged to:: Private residence Living Arrangements: Alone Available Help at Discharge: Family;Available PRN/intermittently(son) Type of Home: House Home Access: Stairs to enter   [employer] of Steps: 1 Home Layout: One level Home Equipment: Walker - 2 wheels(4wheeled upright  walker) Additional Comments: using RW since last hospital admission/LE cellulitis    Prior Function Level of Independence: Independent with assistive device(s)         Comments: driving and gets her own groceries  Hand Dominance        Extremity/Trunk Assessment   Upper Extremity Assessment Upper Extremity Assessment: Overall WFL for tasks assessed    Lower Extremity Assessment Lower Extremity Assessment: Generalized weakness;RLE deficits/detail RLE Deficits / Details: edematous, erythematous. AROM grossly WFL,  strength ~ 3 to 3+/5, not fully tested d/t pain       Communication   Communication: No difficulties  Cognition Arousal/Alertness: Awake/alert Behavior During Therapy: WFL for tasks assessed/performed Overall Cognitive Status: Within Functional Limits for tasks assessed                                 General Comments: pt is a/o x4      General Comments      Exercises General Exercises - Lower Extremity Ankle Circles/Pumps: AROM;5 reps;Both   Assessment/Plan    PT Assessment Patient needs continued PT services  PT Problem List Decreased strength;Decreased activity tolerance;Decreased balance;Decreased mobility;Decreased knowledge of use of DME;Decreased safety awareness;Cardiopulmonary status limiting activity       PT Treatment Interventions DME instruction;Gait training;Functional mobility training;Therapeutic activities;Therapeutic exercise;Balance training;Patient/family education    PT Goals (Current goals can be found in the Care Plan section)  Acute Rehab PT Goals Patient Stated Goal: to get home  PT Goal Formulation: With patient Time For Goal Achievement: 11/15/19 Potential to Achieve Goals: Good    Frequency Min 3X/week   Barriers to discharge Decreased caregiver support      Co-evaluation               AM-PAC PT "6 Clicks" Mobility  Outcome Measure Help needed turning from your back to your side while in a flat bed without using bedrails?: A Little Help needed moving from lying on your back to sitting on the side of a flat bed without using bedrails?: A Little Help needed moving to and from a bed to a chair (including a wheelchair)?: A Little Help needed standing up from a chair using your arms (e.g., wheelchair or bedside chair)?: A Little Help needed to walk in hospital room?: A Little Help needed climbing 3-5 steps with a railing? : A Little 6 Click Score: 18    End of Session Equipment Utilized During Treatment: Gait  belt Activity Tolerance: Patient limited by fatigue Patient left: in chair;with chair alarm set;with call bell/phone within reach   PT Visit Diagnosis: Other abnormalities of gait and mobility (R26.89);Unsteadiness on feet (R26.81)    Time: [id number] PT Time Calculation (min) (ACUTE ONLY): 17 min   Charges:   PT Evaluation $PT Eval Low Complexity: 1 Low          Brentt Fread, PT   Acute Rehab Dept [facility]): [id number]   11/01/2019   [facility] 11/01/2019, 2:59 PM

## 2019-11-01 NOTE — Progress Notes (Signed)
PROGRESS NOTE    Shelly Obrien  [id number] DOB: [birth year] DOA: 10/29/2019 PCP: Shelly Orn, MD   Brief Narrative: :Shelly Sanders Carteris a 86 y.o.year old femalewith medical history significant for HTN, HLD, A. fib on Eliquis, CAD/MI s/pstent, chronic systolic CHF, EF 25 to [id number], severe pulmonary hypertension, CKD 3, vitamin B12 deficiency, pernicious anemiawho presented on 5/27/2021with worsening right lower extremity pain, redness, swelling over the past day. Patient was recently hospitalized from 5/15-5/18 for cellulitis of contralateral leg (left lower extremity) and was treated with IV clindamycin with improvement and discharged on 5 additional days of Clinda which she completed with no complications and noted improvement.  In the ED patient was afebrile, hemodynamically stable, slight tachypnea respiratory rate of 22, Covid test negative, lactic acid 2. Blood cultures were obtained x2. WBC 12.8, potassium 5.7, creatinine 1.93, BUN 27. Chest x-ray showed cardiomegaly with possible trace pleural effusions without pulmonary edema. X-ray of tibia/fibula on right showed diffuse soft tissue swelling. CT of right tibia/fibula showed extensive circumferential skin thickening/soft tissue swelling consistent with cellulitis with no abscess or acute osseous abnormality. Patient received IV clindamycin in the ED. TRH was called for further management. Assessment & Plan:   Active Problems:   CAD (coronary artery disease)   CKD (chronic kidney disease) stage 3, GFR 30-59 ml/min (HCC)   Paroxysmal atrial fibrillation (HCC)   Cardiomyopathy (HCC)   Systolic CHF (HCC)   Longstanding persistent atrial fibrillation (HCC)   Cellulitis   Acute kidney injury superimposed on CKD (HCC)   Cellulitis of right leg   Hyperkalemia   SIRS (systemic inflammatory response syndrome) (HCC)   Severe sepsis (HCC)  Nonpurulent Cellulitis of Right lower extremitywith SIRS. Right lower extremity still  remains very erythematous and tender.  Left lower extremity with decreased erythema more cooler to touch compared to right lower extremity.  Failed outpatient treatment with clindamycin.  CT imaging confirms SSTI without abscess or bone involvement. No clear nidus, had leukocytosis and tachypneabut no fever or other signs of sepsis,venous Doppler negative for DVT -Appreciate ID recommendations, given concern for streptococcal infection we will transition to IV cefazolin -Continue to monitor rash (skin markings placed per nursing), imaging documented in chart -Daily CBC -Monitor blood cultures PT evaluation is pending.  Family would like her to go to a rehab prior to returning home as she lives alone.  AKI on CKD stage III B with hyperkalemia. In setting of ongoing infection. Baseline creatinine 1-1 0.6. 1.9 on admission, slowly improving.  Creatinine 1.51 today.  Potassium 5.3 continue Lokelma.  Chronic systolic CHF with severe pulmonary hypertension. EF 25-30% (TTE, 07/2019). Aside from right lower extremity swelling related to cellulitis looks euvolemic on exam. She does report worsening DOE over the past week. Chest x-ray on admission shows trace pleural effusions per report taken off Lasix by cardiologist due to recent AKI. -Daily weights -Strict I's and O's positive by 1.7 L. -Low-salt diet, fluid restriction -continue home Lopressor  Persistent atrial fibrillation, rate controlled , previously on amiodarone now only on rate control with anticoagulation. Cardioversion a few months ago this year -Continue home Lopressor 50 mg twice daily -continue Eliquis 5 mg twice daily  CAD status postremotePCI ,asymptomatic -no statin or aspirin at home was  Dysphagia with liquids.Esophagram last admission showed esophageal dysmotility. GI the time advised it is more amounts of liquids and sitting upright allow gravity to help -Speech therapy evaluation ordered on  admission  Pernicious anemia. Hemoglobin stable at baseline 11-12. No localizing signs  or symptoms of bleeding. Not on vitamin b12. MCV wnl.   Estimated body mass index is 27.96 kg/m as calculated from the following:   Height as of this encounter: 5\' 5"  (1.651 m).   Weight as of this encounter: 76.2 kg.    Subjective: Resting in bed much more awake and alert lives alone awaiting for PT to walker decreased pain in the right lower extremity  Objective: Vitals:   10/31/19 0545 10/31/19 1204 10/31/19 1811 10/31/19 2127  BP: (!) 133/97 (!) 129/105 (!) 125/93 (!) 142/108  Pulse: 100 98 92 90  Resp: 16 20  16   Temp: 98.4 F (36.9 C) 97.8 F (36.6 C)  98.5 F (36.9 C)  TempSrc: Oral Oral  Oral  SpO2: 91% 93%  95%  Weight: 76.2 kg     Height:        Intake/Output Summary (Last 24 hours) at 11/01/2019 1128 Last data filed at 11/01/2019 0831 Gross per 24 hour  Intake 600 ml  Output 300 ml  Net 300 ml   Filed Weights   10/29/19 0607 10/31/19 0545  Weight: 70 kg 76.2 kg    Examination:  General exam: Appears calm and comfortable  Respiratory system: Clear to auscultation. Respiratory effort normal. Cardiovascular system: S1 & S2 heard, RRR. No JVD, murmurs, rubs, gallops or clicks. No pedal edema. Gastrointestinal system: Abdomen is nondistended, soft and nontender. No organomegaly or masses felt. Normal bowel sounds heard. Central nervous system: Alert and oriented. No focal neurological deficits. Extremities: Erythema to the right lower extremity mildly decreased with decreased swelling left lower extremity mild swelling with no erythema better Skin: As above Psychiatry: Judgement and insight appear normal. Mood & affect appropriate.     Data Reviewed: I have personally reviewed following labs and imaging studies  CBC: Recent Labs  Lab 10/29/19 0644 10/30/19 0438 10/31/19 0423 11/01/19 0356  WBC 12.8* 9.3 11.3* 9.8  NEUTROABS 11.0* 7.4 9.0* 7.6  HGB 12.7  11.5* 11.9* 12.0  HCT 42.9 38.9 40.2 40.7  MCV 96.8 98.0 96.4 95.3  PLT 246 193 222 [id number]   Basic Metabolic Panel: Recent Labs  Lab 10/29/19 0644 10/29/19 0644 10/30/19 0438 10/30/19 0438 10/30/19 0808 10/30/19 1147 10/31/19 0423 10/31/19 0721 10/31/19 1531 10/31/19 2018 11/01/19 0001 11/01/19 0356 11/01/19 0758  NA 138  --  138  --  137  --  133*  --   --   --   --  139  --   K 5.7*   < > 6.0*   < > 6.0*   < > 5.2*  5.8*   < > 5.1 4.8 5.1 4.8 5.3*  CL 104  --  108  --  106  --  104  --   --   --   --  106  --   CO2 22  --  24  --  23  --  20*  --   --   --   --  26  --   GLUCOSE 121*  --  104*  --  108*  --  88  --   --   --   --  110*  --   BUN 27*  --  24*  --  25*  --  25*  --   --   --   --  25*  --   CREATININE 1.93*  --  1.62*  --  1.73*  --  1.46*  --   --   --   --  1.51*  --   CALCIUM 9.4  --  8.6*  --  9.0  --  8.9  --   --   --   --  9.0  --    < > = values in this interval not displayed.   GFR: Estimated Creatinine Clearance: 22.8 mL/min (A) (by C-G formula based on SCr of 1.51 mg/dL (H)). Liver Function Tests: No results for input(s): AST, ALT, ALKPHOS, BILITOT, PROT, ALBUMIN in the last 168 hours. No results for input(s): LIPASE, AMYLASE in the last 168 hours. No results for input(s): AMMONIA in the last 168 hours. Coagulation Profile: No results for input(s): INR, PROTIME in the last 168 hours. Cardiac Enzymes: No results for input(s): CKTOTAL, CKMB, CKMBINDEX, TROPONINI in the last 168 hours. BNP (last 3 results) No results for input(s): PROBNP in the last 8760 hours. HbA1C: No results for input(s): HGBA1C in the last 72 hours. CBG: No results for input(s): GLUCAP in the last 168 hours. Lipid Profile: No results for input(s): CHOL, HDL, LDLCALC, TRIG, CHOLHDL, LDLDIRECT in the last 72 hours. Thyroid Function Tests: No results for input(s): TSH, T4TOTAL, FREET4, T3FREE, THYROIDAB in the last 72 hours. Anemia Panel: No results for input(s):  VITAMINB12, FOLATE, FERRITIN, TIBC, IRON, RETICCTPCT in the last 72 hours. Sepsis Labs: Recent Labs  Lab 10/29/19 0756 10/29/19 1015  LATICACIDVEN 2.0* 1.6    Recent Results (from the past 240 hour(s))  Culture, blood (routine x 2)     Status: None (Preliminary result)   Collection Time: 10/29/19  7:45 AM   Specimen: Left Antecubital; Blood  Result Value Ref Range Status   Specimen Description   Final    LEFT ANTECUBITAL Performed at [facility] [address], [place], [place] [address]    Special Requests   Final    BOTTLES DRAWN AEROBIC AND ANAEROBIC Blood Culture results may not be optimal due to an excessive volume of blood received in culture bottles Performed at [facility] [address] [place], [place] [address]    Culture   Final    NO GROWTH 3 DAYS Performed at [facility], [place] [address] [place], [place] [address]    Report Status PENDING  Incomplete  Culture, blood (routine x 2)     Status: None (Preliminary result)   Collection Time: 10/29/19  8:05 AM   Specimen: BLOOD RIGHT FOREARM  Result Value Ref Range Status   Specimen Description   Final    BLOOD RIGHT FOREARM Performed at [facility] [address], [place], [place] [address]    Special Requests   Final    BOTTLES DRAWN AEROBIC AND ANAEROBIC Blood Culture adequate volume Performed at [facility] [address] [place], [place] [address]    Culture   Final    NO GROWTH 3 DAYS Performed at [facility], [place] [address] [place], [place] [address]    Report Status PENDING  Incomplete  SARS Coronavirus 2 by RT PCR (hospital order, performed in [facility] hospital lab) Nasopharyngeal Nasopharyngeal Swab     Status: None   Collection Time: 10/29/19 10:17 AM   Specimen: Nasopharyngeal Swab  Result Value Ref Range Status   SARS Coronavirus 2 NEGATIVE NEGATIVE Final    Comment: (NOTE) SARS-CoV-2 target  nucleic acids are NOT DETECTED. The SARS-CoV-2 RNA is generally detectable in upper and lower respiratory specimens during the acute phase of infection. The lowest concentration of SARS-CoV-2 viral  copies this assay can detect is 250 copies / mL. A negative result does not preclude SARS-CoV-2 infection and should not be used as the sole basis for treatment or other patient management decisions.  A negative result may occur with improper specimen collection / handling, submission of specimen other than nasopharyngeal swab, presence of viral mutation(s) within the areas targeted by this assay, and inadequate number of viral copies (<250 copies / mL). A negative result must be combined with clinical observations, patient history, and epidemiological information. Fact Sheet for Patients:   StrictlyIdeas.no Fact Sheet for Healthcare Providers: BankingDealers.co.za This test is not yet approved or cleared  by the [place] FDA and has been authorized for detection and/or diagnosis of SARS-CoV-2 by FDA under an Emergency Use Authorization (EUA).  This EUA will remain in effect (meaning this test can be used) for the duration of the COVID-19 declaration under Section 564(b)(1) of the Act, 21 U.S.C. section 360bbb-3(b)(1), unless the authorization is terminated or revoked sooner. Performed at [facility], [place] [address] [place], [place] [address]          Radiology Studies: No results found.      Scheduled Meds: . acetaminophen  1,000 mg Oral Q8H  . acidophilus  1 capsule Oral BID  . apixaban  2.5 mg Oral BID  . metoprolol tartrate  50 mg Oral BID  . senna  1 tablet Oral BID  . sodium chloride flush  3 mL Intravenous Q12H   Continuous Infusions: . sodium chloride 10 mL/hr at 10/30/19 1800  .  ceFAZolin (ANCEF) IV 1 g (11/01/19 0820)     LOS: 3 days     Georgette Shell, MD 11/01/2019, 11:28 AM

## 2019-11-02 LAB — POTASSIUM
Potassium: 4.7 mmol/L (ref 3.5–5.1)
Potassium: 4.8 mmol/L (ref 3.5–5.1)
Potassium: 4.2 mmol/L (ref 3.5–5.1)

## 2019-11-02 LAB — CBC
HCT: 43.2 % (ref 36.0–46.0)
Hemoglobin: 13 g/dL (ref 12.0–15.0)
MCH: 28.7 pg (ref 26.0–34.0)
MCHC: 30.1 g/dL (ref 30.0–36.0)
MCV: 95.4 fL (ref 80.0–100.0)
Platelets: 284 10*3/uL (ref 150–400)
RBC: 4.53 MIL/uL (ref 3.87–5.11)
RDW: 16.9 % — ABNORMAL HIGH (ref 11.5–15.5)
WBC: 8.1 10*3/uL (ref 4.0–10.5)
nRBC: 0 % (ref 0.0–0.2)

## 2019-11-02 LAB — BASIC METABOLIC PANEL
Anion gap: 9 (ref 5–15)
BUN: 21 mg/dL (ref 8–23)
CO2: 25 mmol/L (ref 22–32)
Calcium: 9.1 mg/dL (ref 8.9–10.3)
Chloride: 107 mmol/L (ref 98–111)
Creatinine, Ser: 1.25 mg/dL — ABNORMAL HIGH (ref 0.44–1.00)
GFR calc Af Amer: 42 mL/min — ABNORMAL LOW (ref >60)
GFR calc non Af Amer: 37 mL/min — ABNORMAL LOW (ref >60)
Glucose, Bld: 117 mg/dL — ABNORMAL HIGH (ref 70–99)
Potassium: 4.9 mmol/L (ref 3.5–5.1)
Sodium: 141 mmol/L (ref 135–145)

## 2019-11-02 NOTE — Progress Notes (Signed)
Physical Therapy Treatment Patient Details Name: PETAL GENIER MRN: [id number] DOB: [birth year] Today's Date: 11/02/2019    History of Present Illness 86yo female with medical history significant for HTN, HLD, A. fib, CAD/MI s/p stent, chronic systolic CHF, EF 123XX123, severe pulmonary hypertension, CKD 3, vitamin B12 deficiency, pernicious anemia who presented on 10/29/2019 with worsening right lower extremity pain, redness, swelling. Patient was recently hospitalized from 5/15-5/18 for cellulitis of contralateral leg (left lower extremity) CT findings consistent with cellulitis    PT Comments    Pt progressing well today. Improved activity/gait tolerance. Able to amb 200' with RW and min-guard to supervision for safety. Pt denies DOE today, no significant incr WOB noted by PT.  HR max 116, SpO2=100% on RA. Discussed slowing speed movement, using regular RW for safety, also specifically not getting in shower without family assist or practice with HH. Continue to recommend HHPT, [place], [place] aide if possible. DIL present during PT.  Continue PT in acute setting.     Follow Up Recommendations  Home health PT;Supervision - Intermittent(HH aide if possible)     Equipment Recommendations  None recommended by PT    Recommendations for Other Services       Precautions / Restrictions Precautions Precautions: Fall Restrictions Weight Bearing Restrictions: No    Mobility  Bed Mobility               General bed mobility comments: pt up in recliner  Transfers Overall transfer level: Needs assistance Equipment used: Rolling walker (2 wheeled) Transfers: Sit to/from Stand Sit to Stand: Supervision         General transfer comment: for safety, no physical assist   Ambulation/Gait Ambulation/Gait assistance: Supervision;Min guard Gait Distance (Feet): 200 Feet Assistive device: Rolling walker (2 wheeled) Gait Pattern/deviations: Step-through pattern;Decreased stride length;Wide  base of support     General Gait Details: occasional cues for RW safety, pt tends to leave RW in tighter spaces of room and walk without it   Stairs             Wheelchair Mobility    Modified Rankin (Stroke Patients Only)       Balance     Sitting balance-Leahy Scale: Good     Standing balance support: No upper extremity supported Standing balance-Leahy Scale: Fair Standing balance comment: pt is able to stand an don mask without UE or external support                            Cognition Arousal/Alertness: Awake/alert Behavior During Therapy: WFL for tasks assessed/performed Overall Cognitive Status: Within Functional Limits for tasks assessed                                 General Comments: pt is a/o x4      Exercises      General Comments        Pertinent Vitals/Pain Pain Assessment: Faces Faces Pain Scale: Hurts a little bit Pain Location: RLE with WBing/dependent position Pain Descriptors / Indicators: Sore Pain Intervention(s): Limited activity within patient's tolerance;Monitored during session    Home Living                      Prior Function            PT Goals (current goals can now be found in the care plan section) Acute  Rehab PT Goals Patient Stated Goal: to get home  PT Goal Formulation: With patient Time For Goal Achievement: 11/15/19 Potential to Achieve Goals: Good Progress towards PT goals: Progressing toward goals    Frequency    Min 3X/week      PT Plan Current plan remains appropriate    Co-evaluation              AM-PAC PT "6 Clicks" Mobility   Outcome Measure  Help needed turning from your back to your side while in a flat bed without using bedrails?: A Little Help needed moving from lying on your back to sitting on the side of a flat bed without using bedrails?: A Little Help needed moving to and from a bed to a chair (including a wheelchair)?: A Little Help needed  standing up from a chair using your arms (e.g., wheelchair or bedside chair)?: None Help needed to walk in hospital room?: A Little Help needed climbing 3-5 steps with a railing? : A Little 6 Click Score: 19    End of Session   Activity Tolerance: Patient tolerated treatment well Patient left: in chair;with chair alarm set;with call bell/phone within reach Nurse Communication: Mobility status PT Visit Diagnosis: Other abnormalities of gait and mobility (R26.89);Unsteadiness on feet (R26.81)     Time: [id number] PT Time Calculation (min) (ACUTE ONLY): 21 min  Charges:  $Gait Training: 8-22 mins                     Baxter Flattery, PT   Acute Rehab Dept [facility] [id number]   11/02/2019    [facility] 11/02/2019, 4:11 PM

## 2019-11-02 NOTE — Progress Notes (Signed)
PROGRESS NOTE    Shelly Obrien  [id number] DOB: [birth year] DOA: 10/29/2019 PCP: [name], MD   Chef Complaints:Pain, redness and swelling in the leg  Brief Narrative: 86 year old female with history of hypertension, hyperlipidemia A. fib on Eliquis, CAD/MI/status post stents, chronic CHF with EF 25 to 30%/severe pulmonary hypertension, CKD stage III, vitamin B12 deficiency, pernicious anemia presented on 5/27 with right lower extremity redness, pain and swelling over the past few days.  She was recently hospitalized 515 07/09/2016 for cellulitis of contralateral leg left lower extremity treated with IV clindamycin with improvement and discharged on 5 additional days of clindamycin which she had completed. In the ED stable hemodynamically, slight tachypnea respiration 22, Covid negative lactic acid 2 blood cultures were sent had mild leukocytosis 12.8 hyperkalemia 5.7 creatinine 1.9 chest x-ray with cardiomegaly and possible trace pleural effusion without pulmonary edema, CT of the tibia-fibula showed extensive circumferential skin thickening/soft tissue swelling no abscess or osseous abnormalities patient was admitted.  She was seen by infectious disease.  Patient was de-escalate on IV cefazolin and was treated for nonpurulent cellulitis  Subjective: On the bedside chair.  Complains of some redness and pain and swelling on the right leg but overall much better. Reports she is going to work with PT again to see whether she can safely ambulate and go home with home health versus skilled nursing facility.   Assessment & Plan:  Nonpurulent cellulitis of her RLE with SIRS: Overall much improved still with erythema and tenderness today, failed outpatient treatment with clindamycin, CT imaging did not show any abscess or bony abnormalities seen by infectious disease continue on Ancef while here and upon discharge switch to Keflex to complete total 10-day course.  Currently afebrile and low risk and  stable 8.1K and blood culture negative.  CAD s/p stent: No chest pain continue her Eliquis and metoprolol.  Not on his aspirin or statin at home.  Chronic systolic CHF with 25 to [id number] pulmonary hypertension: Compensated, euvolemic  Dysphagia with liquids esophagogram last admission showed esophageal dysmotility GI at that time advised conservative management.  Continue same.  Hyperkalemia: resolved.  AKI on CKD  stage 3b: creat 1.9 on admission down trended to 1.2. Recent Labs  Lab 10/30/19 0438 10/30/19 0808 10/31/19 0423 11/01/19 0356 11/02/19 0854  BUN 24* 25* 25* 25* 21  CREATININE 1.62* 1.73* 1.46* 1.51* 1.25*   Paroxysmal atrial fibrillation on eliquis and metoprolol.  Pernicious anemia hemoglobin is stable.  Not on vitamin B12.  DVT prophylaxis:DOAC Code Status: DNR Family Communication: plan of care discussed with patient at bedside.  Status is: Inpatient Remains inpatient appropriate because:Unsafe d/c plan, IV treatments appropriate due to intensity of illness or inability to take PO and Ongoing treatment of cellulitis and further repeat assessment with PT   Dispo: The patient is from: Home              Anticipated d/c is to: Home health versus SNF              Anticipated d/c date is: 1 day              Patient currently is not medically stable to d/c.  Diet Order            Diet renal with fluid restriction Fluid restriction: 1200 mL Fluid; Room service appropriate? Yes; Fluid consistency: Thin  Diet effective now            Body mass index is 28.12 kg/m.  Consultants:see  note  Procedures:see note Microbiology:see note  Medications: Scheduled Meds: . acetaminophen  1,000 mg Oral Q8H  . acidophilus  1 capsule Oral BID  . apixaban  2.5 mg Oral BID  . metoprolol tartrate  50 mg Oral BID  . senna  1 tablet Oral BID  . sodium chloride flush  3 mL Intravenous Q12H  . sodium zirconium cyclosilicate  5 g Oral BID   Continuous Infusions: . sodium  chloride 10 mL/hr at 10/30/19 1800  .  ceFAZolin (ANCEF) IV 1 g (11/02/19 0943)    Antimicrobials: Anti-infectives (From admission, onward)   Start     Dose/Rate Route Frequency Ordered Stop   10/30/19 1100  ceFAZolin (ANCEF) IVPB 1 g/50 mL premix     1 g 100 mL/hr over 30 Minutes Intravenous Every 12 hours 10/30/19 1012     10/29/19 1600  clindamycin (CLEOCIN) IVPB 600 mg  Status:  Discontinued     600 mg 100 mL/hr over 30 Minutes Intravenous Every 8 hours 10/29/19 1421 10/30/19 0936   10/29/19 0715  clindamycin (CLEOCIN) IVPB 600 mg     600 mg 100 mL/hr over 30 Minutes Intravenous  Once 10/29/19 0705 10/29/19 1000       Objective: Vitals: Today's Vitals   11/02/19 0500 11/02/19 0530 11/02/19 0930 11/02/19 1358  BP:  (!) 127/95  (!) 139/95  Pulse:  78  69  Resp:  18  16  Temp:  97.7 F (36.5 C)  97.9 F (36.6 C)  TempSrc:  Oral  Oral  SpO2:  95%  98%  Weight: 76.7 kg     Height:      PainSc:   0-No pain     Intake/Output Summary (Last 24 hours) at 11/02/2019 1453 Last data filed at 11/02/2019 0200 Gross per 24 hour  Intake 50 ml  Output --  Net 50 ml   Filed Weights   10/29/19 0607 10/31/19 0545 11/02/19 0500  Weight: 70 kg 76.2 kg 76.7 kg   Weight change:    Intake/Output from previous day: 05/30 0701 - 05/31 0700 In: 50 [IV Piggyback:50] Out: 300 [Urine:300] Intake/Output this shift: No intake/output data recorded.  Examination:  General exam: AAOx3,weak weak appearing. HEENT:Oral mucosa moist, Ear/Nose WNL grossly,dentition normal. Respiratory system: bilaterally clear,no wheezing or crackles,no use of accessory muscle, non tender. Cardiovascular system: S1 & S2 +, regular, No JVD. Gastrointestinal system: Abdomen soft, NT,ND, BS+. Nervous System:Alert, awake, moving extremities and grossly nonfocal Extremities: Mild RLE edema. Erythema and tenderness but significantly improved but complains of pain and redness on right leg still, distal peripheral  pulses palpable.  Skin: No rashes,no icterus. MSK: Normal muscle bulk,tone, power    Data Reviewed: I have personally reviewed following labs and imaging studies CBC: Recent Labs  Lab 10/29/19 0644 10/30/19 0438 10/31/19 0423 11/01/19 0356 11/02/19 0854  WBC 12.8* 9.3 11.3* 9.8 8.1  NEUTROABS 11.0* 7.4 9.0* 7.6  --   HGB 12.7 11.5* 11.9* 12.0 13.0  HCT 42.9 38.9 40.2 40.7 43.2  MCV 96.8 98.0 96.4 95.3 95.4  PLT 246 193 222 247 [id number]   Basic Metabolic Panel: Recent Labs  Lab 10/30/19 0438 10/30/19 0438 10/30/19 0808 10/30/19 1147 10/31/19 0423 10/31/19 0721 11/01/19 0356 11/01/19 0758 11/01/19 2012 11/02/19 0321 11/02/19 0741 11/02/19 0854 11/02/19 1146  NA 138  --  137  --  133*  --  139  --   --   --   --  141  --   K 6.0*   < >  6.0*   < > 5.2*  5.8*   < > 4.8   < > 4.4 4.7 4.8 4.9 4.2  CL 108  --  106  --  104  --  106  --   --   --   --  107  --   CO2 24  --  23  --  20*  --  26  --   --   --   --  25  --   GLUCOSE 104*  --  108*  --  88  --  110*  --   --   --   --  117*  --   BUN 24*  --  25*  --  25*  --  25*  --   --   --   --  21  --   CREATININE 1.62*  --  1.73*  --  1.46*  --  1.51*  --   --   --   --  1.25*  --   CALCIUM 8.6*  --  9.0  --  8.9  --  9.0  --   --   --   --  9.1  --    < > = values in this interval not displayed.   GFR: Estimated Creatinine Clearance: 27.6 mL/min (A) (by C-G formula based on SCr of 1.25 mg/dL (H)). Liver Function Tests: No results for input(s): AST, ALT, ALKPHOS, BILITOT, PROT, ALBUMIN in the last 168 hours. No results for input(s): LIPASE, AMYLASE in the last 168 hours. No results for input(s): AMMONIA in the last 168 hours. Coagulation Profile: No results for input(s): INR, PROTIME in the last 168 hours. Cardiac Enzymes: No results for input(s): CKTOTAL, CKMB, CKMBINDEX, TROPONINI in the last 168 hours. BNP (last 3 results) No results for input(s): PROBNP in the last 8760 hours. HbA1C: No results for input(s):  HGBA1C in the last 72 hours. CBG: No results for input(s): GLUCAP in the last 168 hours. Lipid Profile: No results for input(s): CHOL, HDL, LDLCALC, TRIG, CHOLHDL, LDLDIRECT in the last 72 hours. Thyroid Function Tests: No results for input(s): TSH, T4TOTAL, FREET4, T3FREE, THYROIDAB in the last 72 hours. Anemia Panel: No results for input(s): VITAMINB12, FOLATE, FERRITIN, TIBC, IRON, RETICCTPCT in the last 72 hours. Sepsis Labs: Recent Labs  Lab 10/29/19 0756 10/29/19 1015  LATICACIDVEN 2.0* 1.6    Recent Results (from the past 240 hour(s))  Culture, blood (routine x 2)     Status: None (Preliminary result)   Collection Time: 10/29/19  7:45 AM   Specimen: Left Antecubital; Blood  Result Value Ref Range Status   Specimen Description   Final    LEFT ANTECUBITAL Performed at [facility] [address] [place], [place] [address]    Special Requests   Final    BOTTLES DRAWN AEROBIC AND ANAEROBIC Blood Culture results may not be optimal due to an excessive volume of blood received in culture bottles Performed at [facility] [address] [place], [place] [address]    Culture   Final    NO GROWTH 4 DAYS Performed at [facility], [place] [address], [place],  [address]    Report Status PENDING  Incomplete  Culture, blood (routine x 2)     Status: None (Preliminary result)   Collection Time: 10/29/19  8:05 AM   Specimen: BLOOD RIGHT FOREARM  Result Value Ref Range Status   Specimen Description   Final  BLOOD RIGHT FOREARM Performed at [facility] [address], [place], [place] [address]    Special Requests   Final    BOTTLES DRAWN AEROBIC AND ANAEROBIC Blood Culture adequate volume Performed at [facility] [address], [place], [place] [address]    Culture   Final    NO GROWTH 4 DAYS Performed at [facility], [place] [address], [place], [place] [address]    Report  Status PENDING  Incomplete  SARS Coronavirus 2 by RT PCR (hospital order, performed in [facility] hospital lab) Nasopharyngeal Nasopharyngeal Swab     Status: None   Collection Time: 10/29/19 10:17 AM   Specimen: Nasopharyngeal Swab  Result Value Ref Range Status   SARS Coronavirus 2 NEGATIVE NEGATIVE Final    Comment: (NOTE) SARS-CoV-2 target nucleic acids are NOT DETECTED. The SARS-CoV-2 RNA is generally detectable in upper and lower respiratory specimens during the acute phase of infection. The lowest concentration of SARS-CoV-2 viral copies this assay can detect is 250 copies / mL. A negative result does not preclude SARS-CoV-2 infection and should not be used as the sole basis for treatment or other patient management decisions.  A negative result may occur with improper specimen collection / handling, submission of specimen other than nasopharyngeal swab, presence of viral mutation(s) within the areas targeted by this assay, and inadequate number of viral copies (<250 copies / mL). A negative result must be combined with clinical observations, patient history, and epidemiological information. Fact Sheet for Patients:   StrictlyIdeas.no Fact Sheet for Healthcare Providers: BankingDealers.co.za This test is not yet approved or cleared  by the [place] FDA and has been authorized for detection and/or diagnosis of SARS-CoV-2 by FDA under an Emergency Use Authorization (EUA).  This EUA will remain in effect (meaning this test can be used) for the duration of the COVID-19 declaration under Section 564(b)(1) of the Act, 21 U.S.C. section 360bbb-3(b)(1), unless the authorization is terminated or revoked sooner. Performed at [facility], [place] [address], [place], [place] [address]       Radiology Studies: No results found.   LOS: 4 days   Antonieta Pert, MD [facility]  11/02/2019, 2:53 PM

## 2019-11-02 NOTE — Plan of Care (Signed)
  Problem: Education: Goal: Knowledge of General Education information will improve Description Including pain rating scale, medication(s)/side effects and non-pharmacologic comfort measures Outcome: Progressing   

## 2019-11-03 LAB — CULTURE, BLOOD (ROUTINE X 2)
Culture: NO GROWTH
Culture: NO GROWTH
Special Requests: ADEQUATE

## 2019-11-03 MED ORDER — APIXABAN 2.5 MG PO TABS: 2.5000 mg | ORAL_TABLET | Freq: Two times a day (BID) | ORAL | 0 refills | Status: DC

## 2019-11-03 MED ORDER — CEPHALEXIN 500 MG PO CAPS: 500.0000 mg | ORAL_CAPSULE | Freq: Two times a day (BID) | ORAL | 0 refills | Status: AC

## 2019-11-03 NOTE — Progress Notes (Signed)
Pt discharged to [facility], son to take pt afternoon. D/C instructions reviewed, acknowledged understanding. SRP, RN

## 2019-11-03 NOTE — Discharge Summary (Signed)
Physician Discharge Summary  Shelly Obrien [id number] DOB: 1922-10-09 DOA: 10/29/2019  PCP: [name], MD  Admit date: 10/29/2019 Discharge date: 11/03/2019  Admitted From: home Disposition:  home  Recommendations for Outpatient Follow-up:  1. Follow up with PCP in 1-2 weeks 2. Please obtain BMP/CBC in one week 3. Please follow up on the following pending results:  Home Health: Yes  Equipment/Devices: None  Discharge Condition: Stable Code Status:DNR Diet recommendation:  Diet Order            Diet - low sodium heart healthy        Diet renal with fluid restriction Fluid restriction: 1200 mL Fluid; Room service appropriate? Yes; Fluid consistency: Thin  Diet effective now              Brief/Interim Summary:  86 year old female with history of hypertension, hyperlipidemia A. fib on Eliquis, CAD/MI/status post stents, chronic CHF with EF 25 to 30%/severe pulmonary hypertension, CKD stage III, vitamin B12 deficiency, pernicious anemia presented on 5/27 with right lower extremity redness, pain and swelling over the past few days.  She was recently hospitalized 515 07/09/2016 for cellulitis of contralateral leg left lower extremity treated with IV clindamycin with improvement and discharged on 5 additional days of clindamycin which she had completed. In the ED stable hemodynamically, slight tachypnea respiration 22, Covid negative lactic acid 2 blood cultures were sent had mild leukocytosis 12.8 hyperkalemia 5.7 creatinine 1.9 chest x-ray with cardiomegaly and possible trace pleural effusion without pulmonary edema, CT of the tibia-fibula showed extensive circumferential skin thickening/soft tissue swelling no abscess or osseous abnormalities patient was admitted.  She was seen by infectious disease.  Patient was de-escalate on IV cefazolin and was treated for nonpurulent cellulitis Cellulitis has improved.She was assessed by PT and did well no need for skilled nursing facility and  being discharged to home health.  Patient feels well she reports she is able to manage herself at home.  Discharge Diagnoses:   Nonpurulent cellulitis of her right lower extremity with SIRS:  Resolved. CT imaging did not show any abscess or bony abnormalities seen by infectious disease continue on Ancef while here and upon discharge switch to Keflex to complete total 10-day course-discussed with pharmacy [facility] for Keflex dosing of 500 mg twice dail. We will set up home health PT and RN.  Overall afebrile.  Blood culture negative.  CAD with history of stent: No chest pain continue her Eliquis and metoprolol.  Not on his aspirin or statin at home. Chronic systolic CHF with 25 to [id number] pulmonary hypertension: Compensated, euvolemic Dysphagia with liquids esophagogram last admission showed esophageal dysmotility GI at that time advised conservative management.  Continue same. Hyperkalemia: resolved. AKI on CKD stage 3b: creat 1.9 on admission down trended to 1.25. Paroxysmal atrial fibrillation on eliquis and metoprolol.  Eliquis has been appropriately dosed by pharmacy to 2.5 mg twice daily new prescription sent. Pernicious anemia hemoglobin is stable. F/u pcpc w/ cbc  Consults:  ID  Subjective: Resting, on new complaints. Feels well and wants to go home today.  Discharge Exam: Vitals:   11/03/19 0444 11/03/19 0505  BP: (!) 143/119 (!) 132/98  Pulse: (!) 41   Resp: 16   Temp: 97.7 F (36.5 C)   SpO2: 97%    General: Pt is alert, awake, not in acute distress Cardiovascular: RRR, S1/S2 +, no rubs, no gallops Respiratory: CTA bilaterally, no wheezing, no rhonchi Abdominal: Soft, NT, ND, bowel sounds + Extremities: no edema, no cyanosis  Discharge Instructions  Discharge Instructions    Diet - low sodium heart healthy   Complete by: As directed    Discharge instructions   Complete by: As directed    Follow-up with your primary care doctor for blood work in 1 week  Please  call call MD or return to ER for similar or worsening recurring problem that brought you to hospital or if any fever,nausea/vomiting,abdominal pain, uncontrolled pain, chest pain,  shortness of breath or any other alarming symptoms.  Please follow-up your doctor as instructed in a week time and call the office for appointment.  Please avoid alcohol, smoking, or any other illicit substance and maintain healthy habits including taking your regular medications as prescribed.  You were cared for by a hospitalist during your hospital stay. If you have any questions about your discharge medications or the care you received while you were in the hospital after you are discharged, you can call the unit and ask to speak with the hospitalist on call if the hospitalist that took care of you is not available.  Once you are discharged, your primary care physician will handle any further medical issues. Please note that NO REFILLS for any discharge medications will be authorized once you are discharged, as it is imperative that you return to your primary care physician (or establish a relationship with a primary care physician if you do not have one) for your aftercare needs so that they can reassess your need for medications and monitor your lab values   Increase activity slowly   Complete by: As directed      Allergies as of 11/03/2019      Reactions   Antihistamines, Chlorpheniramine-type Other (See Comments)   Pt states that she passed out.    Penicillins Anaphylaxis, Other (See Comments)   Has patient had a PCN reaction causing immediate rash, facial/tongue/throat swelling, SOB or lightheadedness with hypotension: Yes Has patient had a PCN reaction causing severe rash involving mucus membranes or skin necrosis: No Has patient had a PCN reaction that required hospitalization No Has patient had a PCN reaction occurring within the last 10 years: No If all of the above answers are "NO", then may proceed with  Cephalosporin use.   Contrast Media [iodinated Diagnostic Agents] Hives   Sulfonamide Derivatives Other (See Comments)   Reaction:  Unknown    Atorvastatin Other (See Comments)   myalgias   Pheniramine Other (See Comments)   Pt states that she passed out.       Medication List    TAKE these medications   acetaminophen 500 MG tablet Commonly known as: TYLENOL Take 2 tablets (1,000 mg total) by mouth every 8 (eight) hours.   apixaban 2.5 MG Tabs tablet Commonly known as: Eliquis Take 1 tablet (2.5 mg total) by mouth 2 (two) times daily. What changed:   medication strength  how much to take   cephALEXin 500 MG capsule Commonly known as: KEFLEX Take 1 capsule (500 mg total) by mouth 2 (two) times daily for 5 days.   fluocinonide 0.05 % external solution Commonly known as: LIDEX Apply 1 application topically as needed (itching).   ICaps Caps Take 1 capsule by mouth 2 (two) times daily.   metoprolol tartrate 50 MG tablet Commonly known as: LOPRESSOR Take 75mg  by mouth twice daily What changed:   how much to take  how to take this  when to take this  additional instructions   NOSE DROPS NA Place 1 drop into the  nose daily as needed (alleriges).   Probiotic Acidophilus Tabs Take 1 tablet by mouth in the morning and at bedtime.   THERA TEARS NUTRITION PO Take 1 drop by mouth daily as needed (watery eyes).   triamcinolone 0.025 % cream Commonly known as: KENALOG Apply 1 application topically as needed (itching).      Follow-up Information    [name], MD Follow up in 1 week(s).   Specialty: Internal Medicine Contact information: [address], [address] [place] [address] [phone]        Nahser, Wonda Cheng, MD .   Specialty: Cardiology Contact information: [address] [place] [address] [phone]          Allergies  Allergen Reactions  . Antihistamines, Chlorpheniramine-Type Other (See Comments)     Pt states that she passed out.   [name] Penicillins Anaphylaxis and Other (See Comments)    Has patient had a PCN reaction causing immediate rash, facial/tongue/throat swelling, SOB or lightheadedness with hypotension: Yes Has patient had a PCN reaction causing severe rash involving mucus membranes or skin necrosis: No Has patient had a PCN reaction that required hospitalization No Has patient had a PCN reaction occurring within the last 10 years: No If all of the above answers are "NO", then may proceed with Cephalosporin use.  . Contrast Media [Iodinated Diagnostic Agents] Hives  . Sulfonamide Derivatives Other (See Comments)    Reaction:  Unknown   . Atorvastatin Other (See Comments)    myalgias  . Pheniramine Other (See Comments)    Pt states that she passed out.     The results of significant diagnostics from this hospitalization (including imaging, microbiology, ancillary and laboratory) are listed below for reference.    Microbiology: Recent Results (from the past 240 hour(s))  Culture, blood (routine x 2)     Status: None   Collection Time: 10/29/19  7:45 AM   Specimen: Left Antecubital; Blood  Result Value Ref Range Status   Specimen Description   Final    LEFT ANTECUBITAL Performed at [facility], [place] [address] [place], [place] [address]    Special Requests   Final    BOTTLES DRAWN AEROBIC AND ANAEROBIC Blood Culture results may not be optimal due to an excessive volume of blood received in culture bottles Performed at [place] [address], [place], [place] [address]    Culture   Final    NO GROWTH 5 DAYS Performed at [facility], [place] [address], [place], [place] [address]    Report Status 11/03/2019 FINAL  Final  Culture, blood (routine x 2)     Status: None   Collection Time: 10/29/19  8:05 AM   Specimen: BLOOD RIGHT FOREARM  Result Value Ref Range Status   Specimen Description   Final    BLOOD RIGHT  FOREARM Performed at [place] [address] [place], [place] [address]    Special Requests   Final    BOTTLES DRAWN AEROBIC AND ANAEROBIC Blood Culture adequate volume Performed at [place] [address] [place], [place] [address]    Culture   Final    NO GROWTH 5 DAYS Performed at [facility], [place] [address] [place],  [address]    Report Status 11/03/2019 FINAL  Final  SARS Coronavirus 2 by RT PCR (hospital order, performed in [facility] hospital lab) Nasopharyngeal Nasopharyngeal Swab  Status: None   Collection Time: 10/29/19 10:17 AM   Specimen: Nasopharyngeal Swab  Result Value Ref Range Status   SARS Coronavirus 2 NEGATIVE NEGATIVE Final    Comment: (NOTE) SARS-CoV-2 target nucleic acids are NOT DETECTED. The SARS-CoV-2 RNA is generally detectable in upper and lower respiratory specimens during the acute phase of infection. The lowest concentration of SARS-CoV-2 viral copies this assay can detect is 250 copies / mL. A negative result does not preclude SARS-CoV-2 infection and should not be used as the sole basis for treatment or other patient management decisions.  A negative result may occur with improper specimen collection / handling, submission of specimen other than nasopharyngeal swab, presence of viral mutation(s) within the areas targeted by this assay, and inadequate number of viral copies (<250 copies / mL). A negative result must be combined with clinical observations, patient history, and epidemiological information. Fact Sheet for Patients:   StrictlyIdeas.no Fact Sheet for Healthcare Providers: BankingDealers.co.za This test is not yet approved or cleared  by the [place] FDA and has been authorized for detection and/or diagnosis of SARS-CoV-2 by FDA under an Emergency Use Authorization (EUA).  This EUA will remain in effect (meaning this  test can be used) for the duration of the COVID-19 declaration under Section 564(b)(1) of the Act, 21 U.S.C. section 360bbb-3(b)(1), unless the authorization is terminated or revoked sooner. Performed at [facility], [place] [address] [place], [place] [address]     Procedures/Studies: DG Tibia/Fibula Left  Result Date: 10/17/2019 CLINICAL DATA:  Pt arrives via [facility] from home for c/o LL leg pain and redness that began last night. Pt recent diagnosed with afib and started on eliquis. Does endorse hitting her lower leg a few weeks ago on a ladder, wound on anterior left shin EXAM: LEFT TIBIA AND FIBULA - 2 VIEW COMPARISON:  None. FINDINGS: No fracture or bone lesion. Skeletal structures are diffusely demineralized. Ankle and knee joints are normally aligned. There is medial knee joint compartment narrowing. Calcification is noted along the posterior superficial lower leg soft tissues, likely venous. No radiopaque foreign body. No soft tissue air. IMPRESSION: 1. No fracture or bone lesion. No dislocation. No soft tissue foreign body or air. Electronically Signed   By: Lajean Manes M.D.   On: 10/17/2019 12:14   CT Tibia Fibula Right Wo Contrast  Result Date: 10/29/2019 CLINICAL DATA:  Cellulitis and erysipelas. EXAM: CT OF THE LOWER RIGHT EXTREMITY WITHOUT CONTRAST TECHNIQUE: Multidetector CT imaging of the right lower extremity was performed according to the standard protocol. COMPARISON:  Right tibia and fibula x-rays from same day. FINDINGS: Bones/Joint/Cartilage No osseous destruction or periosteal reaction. No fracture or dislocation. Tricompartmental knee osteoarthritis, moderate in the medial compartment. Osteopenia. Trace knee joint effusion. Ligaments Ligaments are suboptimally evaluated by CT. Muscles and Tendons Grossly intact. Soft tissue Extensive circumferential skin thickening and soft tissue swelling of the lower leg. No fluid collection or hematoma. No soft tissue mass.  IMPRESSION: 1. Extensive circumferential skin thickening and soft tissue swelling of the lower leg, consistent with history of cellulitis. No abscess. 2. No acute osseous abnormality. 3. Tricompartmental knee osteoarthritis. Electronically Signed   By: Titus Dubin M.D.   On: 10/29/2019 09:41   DG Chest Portable 1 View  Result Date: 10/29/2019 CLINICAL DATA:  Shortness of breath EXAM: PORTABLE CHEST 1 VIEW COMPARISON:  07/12/2019 FINDINGS: Cardiomegaly. Mild diaphragm flattening. There is no edema, consolidation, or pneumothorax. Possible trace pleural effusions. No acute osseous finding. IMPRESSION: 1.  Cardiomegaly. 2. Possible trace pleural effusions.  No pulmonary edema. Electronically Signed   By: Monte Fantasia M.D.   On: 10/29/2019 07:45   DG Tibia/Fibula Right Port  Result Date: 10/29/2019 CLINICAL DATA:  Shortness of breath. Right lower extremity redness and swelling. History of cellulitis. EXAM: PORTABLE RIGHT TIBIA AND FIBULA - 2 VIEW COMPARISON:  No recent. FINDINGS: Diffuse soft tissue swelling. Degenerative changes noted about the right knee. No acute bony or joint abnormality identified. No evidence of fracture or dislocation. No focal bony erosions are identified. Mild peripheral vascular calcification. IMPRESSION: Diffuse soft tissue swelling. Diffuse osteopenia. Degenerative changes right knee. No acute or focal bony abnormality. Electronically Signed   By: Marcello Moores  Register   On: 10/29/2019 07:52   VAS [place] LOWER EXTREMITY VENOUS (DVT) (ONLY [facility] & [facility] 7a-7p)  Result Date: 10/29/2019  Lower Venous DVTStudy Indications: Edema.  Comparison Study: 10/17/19 previous Performing Technologist: Abram Sander RVS  Examination Guidelines: A complete evaluation includes B-mode imaging, spectral Doppler, color Doppler, and power Doppler as needed of all accessible portions of each vessel. Bilateral testing is considered an integral part of a complete examination. Limited examinations for reoccurring  indications may be performed as noted. The reflux portion of the exam is performed with the patient in reverse Trendelenburg.  +---------+---------------+---------+-----------+----------+--------------+ RIGHT    CompressibilityPhasicitySpontaneityPropertiesThrombus Aging +---------+---------------+---------+-----------+----------+--------------+ CFV      Full           Yes      Yes                                 +---------+---------------+---------+-----------+----------+--------------+ SFJ      Full                                                        +---------+---------------+---------+-----------+----------+--------------+ FV Prox  Full                                                        +---------+---------------+---------+-----------+----------+--------------+ FV Mid   Full                                                        +---------+---------------+---------+-----------+----------+--------------+ FV DistalFull                                                        +---------+---------------+---------+-----------+----------+--------------+ PFV      Full                                                        +---------+---------------+---------+-----------+----------+--------------+ POP      Full  Yes      Yes                                 +---------+---------------+---------+-----------+----------+--------------+ PTV      Full                                                        +---------+---------------+---------+-----------+----------+--------------+ PERO                                                  Not visualized +---------+---------------+---------+-----------+----------+--------------+   +----+---------------+---------+-----------+----------+--------------+ LEFTCompressibilityPhasicitySpontaneityPropertiesThrombus Aging +----+---------------+---------+-----------+----------+--------------+ CFV Full            Yes      Yes                                 +----+---------------+---------+-----------+----------+--------------+     Summary: RIGHT: - There is no evidence of deep vein thrombosis in the lower extremity.  - No cystic structure found in the popliteal fossa.  LEFT: - No evidence of common femoral vein obstruction.  *See table(s) above for measurements and observations. Electronically signed by Servando Snare MD on 10/29/2019 at 3:40:49 PM.    Final    VAS [place] LOWER EXTREMITY VENOUS (DVT) (ONLY [facility] & [facility])  Result Date: 10/18/2019  Lower Venous DVTStudy Indications: Pain, Swelling, Erythema, and Hit shin on ladder a few weeks ago.  Risk Factors: Atrial fibrillation. Anticoagulation: Eliquis. Comparison Study: Prior study from 06/17/2001 is available for comparison Performing Technologist: Sharion Dove RVS  Examination Guidelines: A complete evaluation includes B-mode imaging, spectral Doppler, color Doppler, and power Doppler as needed of all accessible portions of each vessel. Bilateral testing is considered an integral part of a complete examination. Limited examinations for reoccurring indications may be performed as noted. The reflux portion of the exam is performed with the patient in reverse Trendelenburg.  +-----+---------------+---------+-----------+----------+--------------+ RIGHTCompressibilityPhasicitySpontaneityPropertiesThrombus Aging +-----+---------------+---------+-----------+----------+--------------+ CFV  Full           Yes      Yes                                 +-----+---------------+---------+-----------+----------+--------------+   +---------+---------------+---------+-----------+----------+--------------+ LEFT     CompressibilityPhasicitySpontaneityPropertiesThrombus Aging +---------+---------------+---------+-----------+----------+--------------+ CFV      Full           Yes      Yes                                  +---------+---------------+---------+-----------+----------+--------------+ SFJ      Full                                                        +---------+---------------+---------+-----------+----------+--------------+ FV Prox  Full                                                        +---------+---------------+---------+-----------+----------+--------------+  FV Mid   Full                                                        +---------+---------------+---------+-----------+----------+--------------+ FV DistalFull                                                        +---------+---------------+---------+-----------+----------+--------------+ PFV      Full                                                        +---------+---------------+---------+-----------+----------+--------------+ POP      Full           Yes      Yes                                 +---------+---------------+---------+-----------+----------+--------------+ PTV      Full                                                        +---------+---------------+---------+-----------+----------+--------------+ PERO     Full                                                        +---------+---------------+---------+-----------+----------+--------------+     Summary: RIGHT: - No evidence of common femoral vein obstruction.  LEFT: - Findings appear essentially unchanged compared to previous examination. - There is no evidence of deep vein thrombosis in the lower extremity.  *See table(s) above for measurements and observations. Electronically signed by Ruta Hinds MD on 10/18/2019 at 8:51:50 AM.    Final    DG ESOPHAGUS W SINGLE CM (SOL OR THIN BA)  Result Date: 10/19/2019 CLINICAL DATA:  Recurrent reflux of liquids into the mouth. EXAM: ESOPHOGRAM/BARIUM SWALLOW TECHNIQUE: Single contrast examination was performed using  thin barium. FLUOROSCOPY TIME:  Fluoroscopy Time: 1 minutes and 48  seconds of low-dose pulsed fluoroscopy Radiation Exposure Index (if provided by the fluoroscopic device): 15.4 mGy Number of Acquired Spot Images: 0 COMPARISON:  Radiographs 07/12/2019. Chest CT 09/25/2015. FINDINGS: Study was performed in the supine, semi erect and right lateral decubitus positions. The patient swallowed the barium without difficulty. No pharyngeal abnormalities or laryngeal penetration observed. There is esophageal motility with a decreased primary stripping wave. In all positions, there was retained barium within the esophageal lumen. No stricture, mass, ulceration or significant hiatal hernia was observed. Several times during the examination, the barium and water refluxed from the esophagus into the patient's mouth. No definite gastroesophageal reflux was seen. At the conclusion of the study, a 13 mm barium tablet was administered. This passed  into the distal esophagus, although in the positions evaluated did not pass through the gastroesophageal junction. This appears to be due to the esophageal dysmotility and inability to stand the patient erect rather than a fixed esophageal stricture. IMPRESSION: 1. Esophageal dysmotility with retained barium within the esophageal lumen. Multiple episodes of reflux of the esophageal contents into the patient's mouth without definite gastroesophageal reflux. 2. No stricture, mass, or significant hiatal hernia. 3. The 13 mm barium tablet passed into the distal esophagus but not the stomach, likely due to the esophageal dysmotility. Electronically Signed   By: Richardean Sale M.D.   On: 10/19/2019 10:46    Labs: BNP (last 3 results) Recent Labs    07/12/19 0929 07/17/19 1225  BNP 651.1* [id number]*   Basic Metabolic Panel: Recent Labs  Lab 10/30/19 0438 10/30/19 0438 10/30/19 [id number] 10/30/19 1147 10/31/19 0423 10/31/19 0721 11/01/19 0356 11/01/19 0758 11/01/19 2012 11/02/19 0321 11/02/19 0741 11/02/19 0854 11/02/19 1146  NA 138  --  137   --  133*  --  139  --   --   --   --  141  --   K 6.0*   < > 6.0*   < > 5.2*  5.8*   < > 4.8   < > 4.4 4.7 4.8 4.9 4.2  CL 108  --  106  --  104  --  106  --   --   --   --  107  --   CO2 24  --  23  --  20*  --  26  --   --   --   --  25  --   GLUCOSE 104*  --  108*  --  88  --  110*  --   --   --   --  117*  --   BUN 24*  --  25*  --  25*  --  25*  --   --   --   --  21  --   CREATININE 1.62*  --  1.73*  --  1.46*  --  1.51*  --   --   --   --  1.25*  --   CALCIUM 8.6*  --  9.0  --  8.9  --  9.0  --   --   --   --  9.1  --    < > = values in this interval not displayed.   Liver Function Tests: No results for input(s): AST, ALT, ALKPHOS, BILITOT, PROT, ALBUMIN in the last 168 hours. No results for input(s): LIPASE, AMYLASE in the last 168 hours. No results for input(s): AMMONIA in the last 168 hours. CBC: Recent Labs  Lab 10/29/19 0644 10/30/19 0438 10/31/19 0423 11/01/19 0356 11/02/19 0854  WBC 12.8* 9.3 11.3* 9.8 8.1  NEUTROABS 11.0* 7.4 9.0* 7.6  --   HGB 12.7 11.5* 11.9* 12.0 13.0  HCT 42.9 38.9 40.2 40.7 43.2  MCV 96.8 98.0 96.4 95.3 95.4  PLT 246 193 222 247 284   Cardiac Enzymes: No results for input(s): CKTOTAL, CKMB, CKMBINDEX, TROPONINI in the last 168 hours. BNP: Invalid input(s): POCBNP CBG: No results for input(s): GLUCAP in the last 168 hours. D-Dimer No results for input(s): DDIMER in the last 72 hours. Hgb A1c No results for input(s): HGBA1C in the last 72 hours. Lipid Profile No results for input(s): CHOL, HDL, LDLCALC, TRIG, CHOLHDL, LDLDIRECT in the last 72 hours. Thyroid function studies No results for  input(s): TSH, T4TOTAL, T3FREE, THYROIDAB in the last 72 hours.  Invalid input(s): FREET3 Anemia work up No results for input(s): VITAMINB12, FOLATE, FERRITIN, TIBC, IRON, RETICCTPCT in the last 72 hours. Urinalysis    Component Value Date/Time   COLORURINE YELLOW 12/14/2017 [place] 12/14/2017 0757   LABSPEC 1.020 04/25/2018  0925   PHURINE 6.5 04/25/2018 0925   GLUCOSEU NEGATIVE 04/25/2018 0925   HGBUR NEGATIVE 04/25/2018 0925   BILIRUBINUR NEGATIVE 04/25/2018 0925   KETONESUR NEGATIVE 04/25/2018 0925   PROTEINUR 30 (A) 04/25/2018 0925   UROBILINOGEN 0.2 04/25/2018 0925   NITRITE NEGATIVE 04/25/2018 0925   LEUKOCYTESUR TRACE (A) 04/25/2018 0925   Sepsis Labs Invalid input(s): PROCALCITONIN,  WBC,  LACTICIDVEN Microbiology Recent Results (from the past 240 hour(s))  Culture, blood (routine x 2)     Status: None   Collection Time: 10/29/19  7:45 AM   Specimen: Left Antecubital; Blood  Result Value Ref Range Status   Specimen Description   Final    LEFT ANTECUBITAL Performed at [facility], [place] [address] [place], [place] [address]    Special Requests   Final    BOTTLES DRAWN AEROBIC AND ANAEROBIC Blood Culture results may not be optimal due to an excessive volume of blood received in culture bottles Performed at [place] [address] [place], [place] [address]    Culture   Final    NO GROWTH 5 DAYS Performed at [facility], [place] [address], [place], [place] [address]    Report Status 11/03/2019 FINAL  Final  Culture, blood (routine x 2)     Status: None   Collection Time: 10/29/19  8:05 AM   Specimen: BLOOD RIGHT FOREARM  Result Value Ref Range Status   Specimen Description   Final    BLOOD RIGHT FOREARM Performed at [place] [address] [place], [place] [address]    Special Requests   Final    BOTTLES DRAWN AEROBIC AND ANAEROBIC Blood Culture adequate volume Performed at [place] [address] [place], [place] [address]    Culture   Final    NO GROWTH 5 DAYS Performed at [facility], [place] [address], [place], [place] [address]    Report Status 11/03/2019 FINAL  Final  SARS Coronavirus 2 by RT PCR (hospital order, performed in [facility] hospital lab) Nasopharyngeal  Nasopharyngeal Swab     Status: None   Collection Time: 10/29/19 10:17 AM   Specimen: Nasopharyngeal Swab  Result Value Ref Range Status   SARS Coronavirus 2 NEGATIVE NEGATIVE Final    Comment: (NOTE) SARS-CoV-2 target nucleic acids are NOT DETECTED. The SARS-CoV-2 RNA is generally detectable in upper and lower respiratory specimens during the acute phase of infection. The lowest concentration of SARS-CoV-2 viral copies this assay can detect is 250 copies / mL. A negative result does not preclude SARS-CoV-2 infection and should not be used as the sole basis for treatment or other patient management decisions.  A negative result may occur with improper specimen collection / handling, submission of specimen other than nasopharyngeal swab, presence of viral mutation(s) within the areas targeted by this assay, and inadequate number of viral copies (<250 copies / mL). A negative result must be combined with clinical observations, patient history, and epidemiological information. Fact Sheet for Patients:   StrictlyIdeas.no Fact Sheet for Healthcare Providers: BankingDealers.co.za This test is not yet approved or cleared  by the [place]  States FDA and has been authorized for detection and/or diagnosis of SARS-CoV-2 by FDA under an Emergency Use Authorization (EUA).  This EUA will remain in effect (meaning this test can be used) for the duration of the COVID-19 declaration under Section 564(b)(1) of the Act, 21 U.S.C. section 360bbb-3(b)(1), unless the authorization is terminated or revoked sooner. Performed at [facility], [place] [address], [place], [place] [address]      Time coordinating discharge: 25 minutes  SIGNED: Antonieta Pert, MD  [facility] 11/03/2019, 10:07 AM  If 7PM-7AM, please contact night-coverage [email or web address]

## 2019-11-03 NOTE — Care Management Important Message (Signed)
Important Message  Patient Details IM Letter given to [name] RN to present to the Patient Name: Shelly Obrien MRN: [id number] Date of Birth: 04-05-1923   Medicare Important Message Given:  Yes     Kerin Salen 11/03/2019, 11:33 AM

## 2019-11-03 NOTE — TOC Progression Note (Addendum)
Transition of Care [facility]) - Progression Note    Patient Details  Name: Shelly Obrien MRN: [id number] Date of Birth: January 28, 1923  Transition of Care [facility]) CM/SW Contact  Purcell Mouton, RN Phone Number: 11/03/2019, 10:46 AM  Clinical Narrative:    Pt was active with [facility]. Will return home with [facility] for HHRN/PT. Referral given to Santiago Glad, RN with [facility].    Expected Discharge Plan: [place] Barriers to Discharge: No Barriers Identified  Expected Discharge Plan and Services Expected Discharge Plan: [place]   Discharge Planning Services: CM Consult Post Acute Care Choice: [place] arrangements for the past 2 months: [place] Expected Discharge Date: 11/03/19                           [place]: [place] ([place]) Date [place]: 11/03/19 Time [place]: 1044 Representative spoke with at [place]: Santiago Glad, RN   Social Determinants of Health ([place]) Interventions    Readmission Risk Interventions No flowsheet data found.

## 2019-11-03 NOTE — Plan of Care (Signed)

## 2019-11-03 NOTE — Progress Notes (Signed)
Son arrived to transport pt home. SRP, RN

## 2019-11-05 DIAGNOSIS — Z87891 Personal history of nicotine dependence: Secondary | ICD-10-CM | POA: Diagnosis not present

## 2019-11-05 DIAGNOSIS — E785 Hyperlipidemia, unspecified: Secondary | ICD-10-CM | POA: Diagnosis not present

## 2019-11-05 DIAGNOSIS — I252 Old myocardial infarction: Secondary | ICD-10-CM | POA: Diagnosis not present

## 2019-11-05 DIAGNOSIS — Z90710 Acquired absence of both cervix and uterus: Secondary | ICD-10-CM | POA: Diagnosis not present

## 2019-11-05 DIAGNOSIS — Z87442 Personal history of urinary calculi: Secondary | ICD-10-CM | POA: Diagnosis not present

## 2019-11-05 DIAGNOSIS — Z8781 Personal history of (healed) traumatic fracture: Secondary | ICD-10-CM | POA: Diagnosis not present

## 2019-11-05 DIAGNOSIS — Z9049 Acquired absence of other specified parts of digestive tract: Secondary | ICD-10-CM | POA: Diagnosis not present

## 2019-11-05 DIAGNOSIS — B955 Unspecified streptococcus as the cause of diseases classified elsewhere: Secondary | ICD-10-CM | POA: Diagnosis not present

## 2019-11-05 DIAGNOSIS — I272 Pulmonary hypertension, unspecified: Secondary | ICD-10-CM | POA: Diagnosis not present

## 2019-11-05 DIAGNOSIS — K228 Other specified diseases of esophagus: Secondary | ICD-10-CM | POA: Diagnosis not present

## 2019-11-05 DIAGNOSIS — H3530 Unspecified macular degeneration: Secondary | ICD-10-CM | POA: Diagnosis not present

## 2019-11-05 DIAGNOSIS — Z85828 Personal history of other malignant neoplasm of skin: Secondary | ICD-10-CM | POA: Diagnosis not present

## 2019-11-05 DIAGNOSIS — I130 Hypertensive heart and chronic kidney disease with heart failure and stage 1 through stage 4 chronic kidney disease, or unspecified chronic kidney disease: Secondary | ICD-10-CM | POA: Diagnosis not present

## 2019-11-05 DIAGNOSIS — I2510 Atherosclerotic heart disease of native coronary artery without angina pectoris: Secondary | ICD-10-CM | POA: Diagnosis not present

## 2019-11-05 DIAGNOSIS — L03115 Cellulitis of right lower limb: Secondary | ICD-10-CM | POA: Diagnosis not present

## 2019-11-05 DIAGNOSIS — M810 Age-related osteoporosis without current pathological fracture: Secondary | ICD-10-CM | POA: Diagnosis not present

## 2019-11-05 DIAGNOSIS — I480 Paroxysmal atrial fibrillation: Secondary | ICD-10-CM | POA: Diagnosis not present

## 2019-11-05 DIAGNOSIS — E538 Deficiency of other specified B group vitamins: Secondary | ICD-10-CM | POA: Diagnosis not present

## 2019-11-05 DIAGNOSIS — R1310 Dysphagia, unspecified: Secondary | ICD-10-CM | POA: Diagnosis not present

## 2019-11-05 DIAGNOSIS — Z95818 Presence of other cardiac implants and grafts: Secondary | ICD-10-CM | POA: Diagnosis not present

## 2019-11-05 DIAGNOSIS — I4821 Permanent atrial fibrillation: Secondary | ICD-10-CM | POA: Diagnosis not present

## 2019-11-05 DIAGNOSIS — I77819 Aortic ectasia, unspecified site: Secondary | ICD-10-CM | POA: Diagnosis not present

## 2019-11-05 DIAGNOSIS — I5022 Chronic systolic (congestive) heart failure: Secondary | ICD-10-CM | POA: Diagnosis not present

## 2019-11-05 DIAGNOSIS — N1832 Chronic kidney disease, stage 3b: Secondary | ICD-10-CM | POA: Diagnosis not present

## 2019-11-05 DIAGNOSIS — D510 Vitamin B12 deficiency anemia due to intrinsic factor deficiency: Secondary | ICD-10-CM | POA: Diagnosis not present

## 2019-11-07 DIAGNOSIS — I130 Hypertensive heart and chronic kidney disease with heart failure and stage 1 through stage 4 chronic kidney disease, or unspecified chronic kidney disease: Secondary | ICD-10-CM | POA: Diagnosis not present

## 2019-11-07 DIAGNOSIS — I2510 Atherosclerotic heart disease of native coronary artery without angina pectoris: Secondary | ICD-10-CM | POA: Diagnosis not present

## 2019-11-07 DIAGNOSIS — L03115 Cellulitis of right lower limb: Secondary | ICD-10-CM | POA: Diagnosis not present

## 2019-11-07 DIAGNOSIS — I252 Old myocardial infarction: Secondary | ICD-10-CM | POA: Diagnosis not present

## 2019-11-07 DIAGNOSIS — N1832 Chronic kidney disease, stage 3b: Secondary | ICD-10-CM | POA: Diagnosis not present

## 2019-11-07 DIAGNOSIS — B955 Unspecified streptococcus as the cause of diseases classified elsewhere: Secondary | ICD-10-CM | POA: Diagnosis not present

## 2019-11-09 DIAGNOSIS — I2510 Atherosclerotic heart disease of native coronary artery without angina pectoris: Secondary | ICD-10-CM | POA: Diagnosis not present

## 2019-11-09 DIAGNOSIS — I252 Old myocardial infarction: Secondary | ICD-10-CM | POA: Diagnosis not present

## 2019-11-09 DIAGNOSIS — N1832 Chronic kidney disease, stage 3b: Secondary | ICD-10-CM | POA: Diagnosis not present

## 2019-11-09 DIAGNOSIS — I130 Hypertensive heart and chronic kidney disease with heart failure and stage 1 through stage 4 chronic kidney disease, or unspecified chronic kidney disease: Secondary | ICD-10-CM | POA: Diagnosis not present

## 2019-11-09 DIAGNOSIS — L03115 Cellulitis of right lower limb: Secondary | ICD-10-CM | POA: Diagnosis not present

## 2019-11-09 DIAGNOSIS — B955 Unspecified streptococcus as the cause of diseases classified elsewhere: Secondary | ICD-10-CM | POA: Diagnosis not present

## 2019-11-11 DIAGNOSIS — I2510 Atherosclerotic heart disease of native coronary artery without angina pectoris: Secondary | ICD-10-CM | POA: Diagnosis not present

## 2019-11-11 DIAGNOSIS — L03115 Cellulitis of right lower limb: Secondary | ICD-10-CM | POA: Diagnosis not present

## 2019-11-11 DIAGNOSIS — B955 Unspecified streptococcus as the cause of diseases classified elsewhere: Secondary | ICD-10-CM | POA: Diagnosis not present

## 2019-11-11 DIAGNOSIS — I252 Old myocardial infarction: Secondary | ICD-10-CM | POA: Diagnosis not present

## 2019-11-11 DIAGNOSIS — N1832 Chronic kidney disease, stage 3b: Secondary | ICD-10-CM | POA: Diagnosis not present

## 2019-11-11 DIAGNOSIS — I130 Hypertensive heart and chronic kidney disease with heart failure and stage 1 through stage 4 chronic kidney disease, or unspecified chronic kidney disease: Secondary | ICD-10-CM | POA: Diagnosis not present

## 2019-11-12 DIAGNOSIS — R39198 Other difficulties with micturition: Secondary | ICD-10-CM | POA: Diagnosis not present

## 2019-11-12 DIAGNOSIS — L03116 Cellulitis of left lower limb: Secondary | ICD-10-CM | POA: Diagnosis not present

## 2019-11-12 DIAGNOSIS — I5020 Unspecified systolic (congestive) heart failure: Secondary | ICD-10-CM | POA: Diagnosis not present

## 2019-11-13 DIAGNOSIS — N1832 Chronic kidney disease, stage 3b: Secondary | ICD-10-CM | POA: Diagnosis not present

## 2019-11-13 DIAGNOSIS — I130 Hypertensive heart and chronic kidney disease with heart failure and stage 1 through stage 4 chronic kidney disease, or unspecified chronic kidney disease: Secondary | ICD-10-CM | POA: Diagnosis not present

## 2019-11-13 DIAGNOSIS — L03115 Cellulitis of right lower limb: Secondary | ICD-10-CM | POA: Diagnosis not present

## 2019-11-13 DIAGNOSIS — B955 Unspecified streptococcus as the cause of diseases classified elsewhere: Secondary | ICD-10-CM | POA: Diagnosis not present

## 2019-11-13 DIAGNOSIS — I252 Old myocardial infarction: Secondary | ICD-10-CM | POA: Diagnosis not present

## 2019-11-13 DIAGNOSIS — I2510 Atherosclerotic heart disease of native coronary artery without angina pectoris: Secondary | ICD-10-CM | POA: Diagnosis not present

## 2019-11-16 DIAGNOSIS — I2510 Atherosclerotic heart disease of native coronary artery without angina pectoris: Secondary | ICD-10-CM | POA: Diagnosis not present

## 2019-11-16 DIAGNOSIS — L03115 Cellulitis of right lower limb: Secondary | ICD-10-CM | POA: Diagnosis not present

## 2019-11-16 DIAGNOSIS — I130 Hypertensive heart and chronic kidney disease with heart failure and stage 1 through stage 4 chronic kidney disease, or unspecified chronic kidney disease: Secondary | ICD-10-CM | POA: Diagnosis not present

## 2019-11-16 DIAGNOSIS — B955 Unspecified streptococcus as the cause of diseases classified elsewhere: Secondary | ICD-10-CM | POA: Diagnosis not present

## 2019-11-16 DIAGNOSIS — I252 Old myocardial infarction: Secondary | ICD-10-CM | POA: Diagnosis not present

## 2019-11-16 DIAGNOSIS — N1832 Chronic kidney disease, stage 3b: Secondary | ICD-10-CM | POA: Diagnosis not present

## 2019-11-18 DIAGNOSIS — I252 Old myocardial infarction: Secondary | ICD-10-CM | POA: Diagnosis not present

## 2019-11-18 DIAGNOSIS — L03115 Cellulitis of right lower limb: Secondary | ICD-10-CM | POA: Diagnosis not present

## 2019-11-18 DIAGNOSIS — N1832 Chronic kidney disease, stage 3b: Secondary | ICD-10-CM | POA: Diagnosis not present

## 2019-11-18 DIAGNOSIS — B955 Unspecified streptococcus as the cause of diseases classified elsewhere: Secondary | ICD-10-CM | POA: Diagnosis not present

## 2019-11-18 DIAGNOSIS — I2510 Atherosclerotic heart disease of native coronary artery without angina pectoris: Secondary | ICD-10-CM | POA: Diagnosis not present

## 2019-11-18 DIAGNOSIS — I130 Hypertensive heart and chronic kidney disease with heart failure and stage 1 through stage 4 chronic kidney disease, or unspecified chronic kidney disease: Secondary | ICD-10-CM | POA: Diagnosis not present

## 2019-11-20 DIAGNOSIS — B955 Unspecified streptococcus as the cause of diseases classified elsewhere: Secondary | ICD-10-CM | POA: Diagnosis not present

## 2019-11-20 DIAGNOSIS — N1832 Chronic kidney disease, stage 3b: Secondary | ICD-10-CM | POA: Diagnosis not present

## 2019-11-20 DIAGNOSIS — I252 Old myocardial infarction: Secondary | ICD-10-CM | POA: Diagnosis not present

## 2019-11-20 DIAGNOSIS — I130 Hypertensive heart and chronic kidney disease with heart failure and stage 1 through stage 4 chronic kidney disease, or unspecified chronic kidney disease: Secondary | ICD-10-CM | POA: Diagnosis not present

## 2019-11-20 DIAGNOSIS — I2510 Atherosclerotic heart disease of native coronary artery without angina pectoris: Secondary | ICD-10-CM | POA: Diagnosis not present

## 2019-11-20 DIAGNOSIS — L03115 Cellulitis of right lower limb: Secondary | ICD-10-CM | POA: Diagnosis not present

## 2019-11-21 DIAGNOSIS — B955 Unspecified streptococcus as the cause of diseases classified elsewhere: Secondary | ICD-10-CM | POA: Diagnosis not present

## 2019-11-21 DIAGNOSIS — I130 Hypertensive heart and chronic kidney disease with heart failure and stage 1 through stage 4 chronic kidney disease, or unspecified chronic kidney disease: Secondary | ICD-10-CM | POA: Diagnosis not present

## 2019-11-21 DIAGNOSIS — L03115 Cellulitis of right lower limb: Secondary | ICD-10-CM | POA: Diagnosis not present

## 2019-11-21 DIAGNOSIS — I252 Old myocardial infarction: Secondary | ICD-10-CM | POA: Diagnosis not present

## 2019-11-21 DIAGNOSIS — I2510 Atherosclerotic heart disease of native coronary artery without angina pectoris: Secondary | ICD-10-CM | POA: Diagnosis not present

## 2019-11-21 DIAGNOSIS — N1832 Chronic kidney disease, stage 3b: Secondary | ICD-10-CM | POA: Diagnosis not present

## 2019-11-23 DIAGNOSIS — I252 Old myocardial infarction: Secondary | ICD-10-CM | POA: Diagnosis not present

## 2019-11-23 DIAGNOSIS — I2510 Atherosclerotic heart disease of native coronary artery without angina pectoris: Secondary | ICD-10-CM | POA: Diagnosis not present

## 2019-11-23 DIAGNOSIS — L03115 Cellulitis of right lower limb: Secondary | ICD-10-CM | POA: Diagnosis not present

## 2019-11-23 DIAGNOSIS — I130 Hypertensive heart and chronic kidney disease with heart failure and stage 1 through stage 4 chronic kidney disease, or unspecified chronic kidney disease: Secondary | ICD-10-CM | POA: Diagnosis not present

## 2019-11-23 DIAGNOSIS — B955 Unspecified streptococcus as the cause of diseases classified elsewhere: Secondary | ICD-10-CM | POA: Diagnosis not present

## 2019-11-23 DIAGNOSIS — N1832 Chronic kidney disease, stage 3b: Secondary | ICD-10-CM | POA: Diagnosis not present

## 2019-11-24 DIAGNOSIS — L03115 Cellulitis of right lower limb: Secondary | ICD-10-CM | POA: Diagnosis not present

## 2019-11-24 DIAGNOSIS — B955 Unspecified streptococcus as the cause of diseases classified elsewhere: Secondary | ICD-10-CM | POA: Diagnosis not present

## 2019-11-24 DIAGNOSIS — I252 Old myocardial infarction: Secondary | ICD-10-CM | POA: Diagnosis not present

## 2019-11-24 DIAGNOSIS — I130 Hypertensive heart and chronic kidney disease with heart failure and stage 1 through stage 4 chronic kidney disease, or unspecified chronic kidney disease: Secondary | ICD-10-CM | POA: Diagnosis not present

## 2019-11-24 DIAGNOSIS — I2510 Atherosclerotic heart disease of native coronary artery without angina pectoris: Secondary | ICD-10-CM | POA: Diagnosis not present

## 2019-11-24 DIAGNOSIS — N1832 Chronic kidney disease, stage 3b: Secondary | ICD-10-CM | POA: Diagnosis not present

## 2019-11-25 DIAGNOSIS — B955 Unspecified streptococcus as the cause of diseases classified elsewhere: Secondary | ICD-10-CM | POA: Diagnosis not present

## 2019-11-25 DIAGNOSIS — L03115 Cellulitis of right lower limb: Secondary | ICD-10-CM | POA: Diagnosis not present

## 2019-11-25 DIAGNOSIS — I252 Old myocardial infarction: Secondary | ICD-10-CM | POA: Diagnosis not present

## 2019-11-25 DIAGNOSIS — N1832 Chronic kidney disease, stage 3b: Secondary | ICD-10-CM | POA: Diagnosis not present

## 2019-11-25 DIAGNOSIS — I2510 Atherosclerotic heart disease of native coronary artery without angina pectoris: Secondary | ICD-10-CM | POA: Diagnosis not present

## 2019-11-25 DIAGNOSIS — I130 Hypertensive heart and chronic kidney disease with heart failure and stage 1 through stage 4 chronic kidney disease, or unspecified chronic kidney disease: Secondary | ICD-10-CM | POA: Diagnosis not present

## 2019-11-30 DIAGNOSIS — L03115 Cellulitis of right lower limb: Secondary | ICD-10-CM | POA: Diagnosis not present

## 2019-11-30 DIAGNOSIS — I130 Hypertensive heart and chronic kidney disease with heart failure and stage 1 through stage 4 chronic kidney disease, or unspecified chronic kidney disease: Secondary | ICD-10-CM | POA: Diagnosis not present

## 2019-11-30 DIAGNOSIS — I252 Old myocardial infarction: Secondary | ICD-10-CM | POA: Diagnosis not present

## 2019-11-30 DIAGNOSIS — B955 Unspecified streptococcus as the cause of diseases classified elsewhere: Secondary | ICD-10-CM | POA: Diagnosis not present

## 2019-11-30 DIAGNOSIS — I2510 Atherosclerotic heart disease of native coronary artery without angina pectoris: Secondary | ICD-10-CM | POA: Diagnosis not present

## 2019-11-30 DIAGNOSIS — N1832 Chronic kidney disease, stage 3b: Secondary | ICD-10-CM | POA: Diagnosis not present

## 2019-12-02 DIAGNOSIS — I2510 Atherosclerotic heart disease of native coronary artery without angina pectoris: Secondary | ICD-10-CM | POA: Diagnosis not present

## 2019-12-02 DIAGNOSIS — I130 Hypertensive heart and chronic kidney disease with heart failure and stage 1 through stage 4 chronic kidney disease, or unspecified chronic kidney disease: Secondary | ICD-10-CM | POA: Diagnosis not present

## 2019-12-02 DIAGNOSIS — L03115 Cellulitis of right lower limb: Secondary | ICD-10-CM | POA: Diagnosis not present

## 2019-12-02 DIAGNOSIS — B955 Unspecified streptococcus as the cause of diseases classified elsewhere: Secondary | ICD-10-CM | POA: Diagnosis not present

## 2019-12-02 DIAGNOSIS — I252 Old myocardial infarction: Secondary | ICD-10-CM | POA: Diagnosis not present

## 2019-12-02 DIAGNOSIS — N1832 Chronic kidney disease, stage 3b: Secondary | ICD-10-CM | POA: Diagnosis not present

## 2019-12-04 DIAGNOSIS — I130 Hypertensive heart and chronic kidney disease with heart failure and stage 1 through stage 4 chronic kidney disease, or unspecified chronic kidney disease: Secondary | ICD-10-CM | POA: Diagnosis not present

## 2019-12-04 DIAGNOSIS — N1832 Chronic kidney disease, stage 3b: Secondary | ICD-10-CM | POA: Diagnosis not present

## 2019-12-04 DIAGNOSIS — I252 Old myocardial infarction: Secondary | ICD-10-CM | POA: Diagnosis not present

## 2019-12-04 DIAGNOSIS — I2510 Atherosclerotic heart disease of native coronary artery without angina pectoris: Secondary | ICD-10-CM | POA: Diagnosis not present

## 2019-12-04 DIAGNOSIS — B955 Unspecified streptococcus as the cause of diseases classified elsewhere: Secondary | ICD-10-CM | POA: Diagnosis not present

## 2019-12-04 DIAGNOSIS — L03115 Cellulitis of right lower limb: Secondary | ICD-10-CM | POA: Diagnosis not present

## 2019-12-05 DIAGNOSIS — K228 Other specified diseases of esophagus: Secondary | ICD-10-CM | POA: Diagnosis not present

## 2019-12-05 DIAGNOSIS — M810 Age-related osteoporosis without current pathological fracture: Secondary | ICD-10-CM | POA: Diagnosis not present

## 2019-12-05 DIAGNOSIS — Z95818 Presence of other cardiac implants and grafts: Secondary | ICD-10-CM | POA: Diagnosis not present

## 2019-12-05 DIAGNOSIS — I130 Hypertensive heart and chronic kidney disease with heart failure and stage 1 through stage 4 chronic kidney disease, or unspecified chronic kidney disease: Secondary | ICD-10-CM | POA: Diagnosis not present

## 2019-12-05 DIAGNOSIS — I252 Old myocardial infarction: Secondary | ICD-10-CM | POA: Diagnosis not present

## 2019-12-05 DIAGNOSIS — Z87442 Personal history of urinary calculi: Secondary | ICD-10-CM | POA: Diagnosis not present

## 2019-12-05 DIAGNOSIS — B955 Unspecified streptococcus as the cause of diseases classified elsewhere: Secondary | ICD-10-CM | POA: Diagnosis not present

## 2019-12-05 DIAGNOSIS — Z9049 Acquired absence of other specified parts of digestive tract: Secondary | ICD-10-CM | POA: Diagnosis not present

## 2019-12-05 DIAGNOSIS — I5022 Chronic systolic (congestive) heart failure: Secondary | ICD-10-CM | POA: Diagnosis not present

## 2019-12-05 DIAGNOSIS — H3530 Unspecified macular degeneration: Secondary | ICD-10-CM | POA: Diagnosis not present

## 2019-12-05 DIAGNOSIS — Z90710 Acquired absence of both cervix and uterus: Secondary | ICD-10-CM | POA: Diagnosis not present

## 2019-12-05 DIAGNOSIS — I272 Pulmonary hypertension, unspecified: Secondary | ICD-10-CM | POA: Diagnosis not present

## 2019-12-05 DIAGNOSIS — L03115 Cellulitis of right lower limb: Secondary | ICD-10-CM | POA: Diagnosis not present

## 2019-12-05 DIAGNOSIS — E538 Deficiency of other specified B group vitamins: Secondary | ICD-10-CM | POA: Diagnosis not present

## 2019-12-05 DIAGNOSIS — D510 Vitamin B12 deficiency anemia due to intrinsic factor deficiency: Secondary | ICD-10-CM | POA: Diagnosis not present

## 2019-12-05 DIAGNOSIS — Z87891 Personal history of nicotine dependence: Secondary | ICD-10-CM | POA: Diagnosis not present

## 2019-12-05 DIAGNOSIS — N1832 Chronic kidney disease, stage 3b: Secondary | ICD-10-CM | POA: Diagnosis not present

## 2019-12-05 DIAGNOSIS — E785 Hyperlipidemia, unspecified: Secondary | ICD-10-CM | POA: Diagnosis not present

## 2019-12-05 DIAGNOSIS — Z85828 Personal history of other malignant neoplasm of skin: Secondary | ICD-10-CM | POA: Diagnosis not present

## 2019-12-05 DIAGNOSIS — I4821 Permanent atrial fibrillation: Secondary | ICD-10-CM | POA: Diagnosis not present

## 2019-12-05 DIAGNOSIS — I480 Paroxysmal atrial fibrillation: Secondary | ICD-10-CM | POA: Diagnosis not present

## 2019-12-05 DIAGNOSIS — R1310 Dysphagia, unspecified: Secondary | ICD-10-CM | POA: Diagnosis not present

## 2019-12-05 DIAGNOSIS — I2510 Atherosclerotic heart disease of native coronary artery without angina pectoris: Secondary | ICD-10-CM | POA: Diagnosis not present

## 2019-12-05 DIAGNOSIS — Z8781 Personal history of (healed) traumatic fracture: Secondary | ICD-10-CM | POA: Diagnosis not present

## 2019-12-05 DIAGNOSIS — I77819 Aortic ectasia, unspecified site: Secondary | ICD-10-CM | POA: Diagnosis not present

## 2019-12-07 DIAGNOSIS — I252 Old myocardial infarction: Secondary | ICD-10-CM | POA: Diagnosis not present

## 2019-12-07 DIAGNOSIS — B955 Unspecified streptococcus as the cause of diseases classified elsewhere: Secondary | ICD-10-CM | POA: Diagnosis not present

## 2019-12-07 DIAGNOSIS — I2510 Atherosclerotic heart disease of native coronary artery without angina pectoris: Secondary | ICD-10-CM | POA: Diagnosis not present

## 2019-12-07 DIAGNOSIS — L03115 Cellulitis of right lower limb: Secondary | ICD-10-CM | POA: Diagnosis not present

## 2019-12-07 DIAGNOSIS — I130 Hypertensive heart and chronic kidney disease with heart failure and stage 1 through stage 4 chronic kidney disease, or unspecified chronic kidney disease: Secondary | ICD-10-CM | POA: Diagnosis not present

## 2019-12-07 DIAGNOSIS — N1832 Chronic kidney disease, stage 3b: Secondary | ICD-10-CM | POA: Diagnosis not present

## 2019-12-09 DIAGNOSIS — I252 Old myocardial infarction: Secondary | ICD-10-CM | POA: Diagnosis not present

## 2019-12-09 DIAGNOSIS — N1832 Chronic kidney disease, stage 3b: Secondary | ICD-10-CM | POA: Diagnosis not present

## 2019-12-09 DIAGNOSIS — I130 Hypertensive heart and chronic kidney disease with heart failure and stage 1 through stage 4 chronic kidney disease, or unspecified chronic kidney disease: Secondary | ICD-10-CM | POA: Diagnosis not present

## 2019-12-09 DIAGNOSIS — B955 Unspecified streptococcus as the cause of diseases classified elsewhere: Secondary | ICD-10-CM | POA: Diagnosis not present

## 2019-12-09 DIAGNOSIS — L03115 Cellulitis of right lower limb: Secondary | ICD-10-CM | POA: Diagnosis not present

## 2019-12-09 DIAGNOSIS — I2510 Atherosclerotic heart disease of native coronary artery without angina pectoris: Secondary | ICD-10-CM | POA: Diagnosis not present

## 2019-12-11 DIAGNOSIS — B955 Unspecified streptococcus as the cause of diseases classified elsewhere: Secondary | ICD-10-CM | POA: Diagnosis not present

## 2019-12-11 DIAGNOSIS — N1832 Chronic kidney disease, stage 3b: Secondary | ICD-10-CM | POA: Diagnosis not present

## 2019-12-11 DIAGNOSIS — L03115 Cellulitis of right lower limb: Secondary | ICD-10-CM | POA: Diagnosis not present

## 2019-12-11 DIAGNOSIS — I130 Hypertensive heart and chronic kidney disease with heart failure and stage 1 through stage 4 chronic kidney disease, or unspecified chronic kidney disease: Secondary | ICD-10-CM | POA: Diagnosis not present

## 2019-12-11 DIAGNOSIS — I252 Old myocardial infarction: Secondary | ICD-10-CM | POA: Diagnosis not present

## 2019-12-11 DIAGNOSIS — I2510 Atherosclerotic heart disease of native coronary artery without angina pectoris: Secondary | ICD-10-CM | POA: Diagnosis not present

## 2019-12-15 DIAGNOSIS — L03115 Cellulitis of right lower limb: Secondary | ICD-10-CM | POA: Diagnosis not present

## 2019-12-15 DIAGNOSIS — B955 Unspecified streptococcus as the cause of diseases classified elsewhere: Secondary | ICD-10-CM | POA: Diagnosis not present

## 2019-12-15 DIAGNOSIS — I130 Hypertensive heart and chronic kidney disease with heart failure and stage 1 through stage 4 chronic kidney disease, or unspecified chronic kidney disease: Secondary | ICD-10-CM | POA: Diagnosis not present

## 2019-12-15 DIAGNOSIS — I2510 Atherosclerotic heart disease of native coronary artery without angina pectoris: Secondary | ICD-10-CM | POA: Diagnosis not present

## 2019-12-15 DIAGNOSIS — N1832 Chronic kidney disease, stage 3b: Secondary | ICD-10-CM | POA: Diagnosis not present

## 2019-12-15 DIAGNOSIS — I252 Old myocardial infarction: Secondary | ICD-10-CM | POA: Diagnosis not present

## 2019-12-17 DIAGNOSIS — H353231 Exudative age-related macular degeneration, bilateral, with active choroidal neovascularization: Secondary | ICD-10-CM | POA: Diagnosis not present

## 2019-12-19 DIAGNOSIS — I252 Old myocardial infarction: Secondary | ICD-10-CM | POA: Diagnosis not present

## 2019-12-19 DIAGNOSIS — I2510 Atherosclerotic heart disease of native coronary artery without angina pectoris: Secondary | ICD-10-CM | POA: Diagnosis not present

## 2019-12-19 DIAGNOSIS — B955 Unspecified streptococcus as the cause of diseases classified elsewhere: Secondary | ICD-10-CM | POA: Diagnosis not present

## 2019-12-19 DIAGNOSIS — I130 Hypertensive heart and chronic kidney disease with heart failure and stage 1 through stage 4 chronic kidney disease, or unspecified chronic kidney disease: Secondary | ICD-10-CM | POA: Diagnosis not present

## 2019-12-19 DIAGNOSIS — L03115 Cellulitis of right lower limb: Secondary | ICD-10-CM | POA: Diagnosis not present

## 2019-12-19 DIAGNOSIS — N1832 Chronic kidney disease, stage 3b: Secondary | ICD-10-CM | POA: Diagnosis not present

## 2019-12-21 DIAGNOSIS — I252 Old myocardial infarction: Secondary | ICD-10-CM | POA: Diagnosis not present

## 2019-12-21 DIAGNOSIS — L03115 Cellulitis of right lower limb: Secondary | ICD-10-CM | POA: Diagnosis not present

## 2019-12-21 DIAGNOSIS — N1832 Chronic kidney disease, stage 3b: Secondary | ICD-10-CM | POA: Diagnosis not present

## 2019-12-21 DIAGNOSIS — B955 Unspecified streptococcus as the cause of diseases classified elsewhere: Secondary | ICD-10-CM | POA: Diagnosis not present

## 2019-12-21 DIAGNOSIS — I130 Hypertensive heart and chronic kidney disease with heart failure and stage 1 through stage 4 chronic kidney disease, or unspecified chronic kidney disease: Secondary | ICD-10-CM | POA: Diagnosis not present

## 2019-12-21 DIAGNOSIS — I2510 Atherosclerotic heart disease of native coronary artery without angina pectoris: Secondary | ICD-10-CM | POA: Diagnosis not present

## 2019-12-22 DIAGNOSIS — I252 Old myocardial infarction: Secondary | ICD-10-CM | POA: Diagnosis not present

## 2019-12-22 DIAGNOSIS — I2510 Atherosclerotic heart disease of native coronary artery without angina pectoris: Secondary | ICD-10-CM | POA: Diagnosis not present

## 2019-12-22 DIAGNOSIS — I130 Hypertensive heart and chronic kidney disease with heart failure and stage 1 through stage 4 chronic kidney disease, or unspecified chronic kidney disease: Secondary | ICD-10-CM | POA: Diagnosis not present

## 2019-12-22 DIAGNOSIS — B955 Unspecified streptococcus as the cause of diseases classified elsewhere: Secondary | ICD-10-CM | POA: Diagnosis not present

## 2019-12-22 DIAGNOSIS — L03115 Cellulitis of right lower limb: Secondary | ICD-10-CM | POA: Diagnosis not present

## 2019-12-22 DIAGNOSIS — N1832 Chronic kidney disease, stage 3b: Secondary | ICD-10-CM | POA: Diagnosis not present

## 2019-12-25 DIAGNOSIS — N1832 Chronic kidney disease, stage 3b: Secondary | ICD-10-CM | POA: Diagnosis not present

## 2019-12-25 DIAGNOSIS — I2510 Atherosclerotic heart disease of native coronary artery without angina pectoris: Secondary | ICD-10-CM | POA: Diagnosis not present

## 2019-12-25 DIAGNOSIS — B955 Unspecified streptococcus as the cause of diseases classified elsewhere: Secondary | ICD-10-CM | POA: Diagnosis not present

## 2019-12-25 DIAGNOSIS — I252 Old myocardial infarction: Secondary | ICD-10-CM | POA: Diagnosis not present

## 2019-12-25 DIAGNOSIS — I130 Hypertensive heart and chronic kidney disease with heart failure and stage 1 through stage 4 chronic kidney disease, or unspecified chronic kidney disease: Secondary | ICD-10-CM | POA: Diagnosis not present

## 2019-12-25 DIAGNOSIS — L03115 Cellulitis of right lower limb: Secondary | ICD-10-CM | POA: Diagnosis not present

## 2019-12-28 DIAGNOSIS — N1832 Chronic kidney disease, stage 3b: Secondary | ICD-10-CM | POA: Diagnosis not present

## 2019-12-28 DIAGNOSIS — I130 Hypertensive heart and chronic kidney disease with heart failure and stage 1 through stage 4 chronic kidney disease, or unspecified chronic kidney disease: Secondary | ICD-10-CM | POA: Diagnosis not present

## 2019-12-28 DIAGNOSIS — B955 Unspecified streptococcus as the cause of diseases classified elsewhere: Secondary | ICD-10-CM | POA: Diagnosis not present

## 2019-12-28 DIAGNOSIS — I2510 Atherosclerotic heart disease of native coronary artery without angina pectoris: Secondary | ICD-10-CM | POA: Diagnosis not present

## 2019-12-28 DIAGNOSIS — L03115 Cellulitis of right lower limb: Secondary | ICD-10-CM | POA: Diagnosis not present

## 2019-12-28 DIAGNOSIS — I252 Old myocardial infarction: Secondary | ICD-10-CM | POA: Diagnosis not present

## 2019-12-29 DIAGNOSIS — N1832 Chronic kidney disease, stage 3b: Secondary | ICD-10-CM | POA: Diagnosis not present

## 2019-12-29 DIAGNOSIS — I130 Hypertensive heart and chronic kidney disease with heart failure and stage 1 through stage 4 chronic kidney disease, or unspecified chronic kidney disease: Secondary | ICD-10-CM | POA: Diagnosis not present

## 2019-12-29 DIAGNOSIS — L03115 Cellulitis of right lower limb: Secondary | ICD-10-CM | POA: Diagnosis not present

## 2019-12-29 DIAGNOSIS — B955 Unspecified streptococcus as the cause of diseases classified elsewhere: Secondary | ICD-10-CM | POA: Diagnosis not present

## 2019-12-29 DIAGNOSIS — I252 Old myocardial infarction: Secondary | ICD-10-CM | POA: Diagnosis not present

## 2019-12-29 DIAGNOSIS — I2510 Atherosclerotic heart disease of native coronary artery without angina pectoris: Secondary | ICD-10-CM | POA: Diagnosis not present

## 2019-12-31 DIAGNOSIS — N1832 Chronic kidney disease, stage 3b: Secondary | ICD-10-CM | POA: Diagnosis not present

## 2019-12-31 DIAGNOSIS — B955 Unspecified streptococcus as the cause of diseases classified elsewhere: Secondary | ICD-10-CM | POA: Diagnosis not present

## 2019-12-31 DIAGNOSIS — I130 Hypertensive heart and chronic kidney disease with heart failure and stage 1 through stage 4 chronic kidney disease, or unspecified chronic kidney disease: Secondary | ICD-10-CM | POA: Diagnosis not present

## 2019-12-31 DIAGNOSIS — L03115 Cellulitis of right lower limb: Secondary | ICD-10-CM | POA: Diagnosis not present

## 2019-12-31 DIAGNOSIS — I2510 Atherosclerotic heart disease of native coronary artery without angina pectoris: Secondary | ICD-10-CM | POA: Diagnosis not present

## 2019-12-31 DIAGNOSIS — I252 Old myocardial infarction: Secondary | ICD-10-CM | POA: Diagnosis not present

## 2020-01-11 DIAGNOSIS — W19XXXA Unspecified fall, initial encounter: Secondary | ICD-10-CM | POA: Diagnosis not present

## 2020-01-11 DIAGNOSIS — R52 Pain, unspecified: Secondary | ICD-10-CM | POA: Diagnosis not present

## 2020-01-12 ENCOUNTER — Inpatient Hospital Stay (HOSPITAL_COMMUNITY): Payer: Medicare Other

## 2020-01-12 ENCOUNTER — Emergency Department (HOSPITAL_COMMUNITY): Payer: Medicare Other

## 2020-01-12 ENCOUNTER — Inpatient Hospital Stay (HOSPITAL_COMMUNITY)
Admission: EM | Admit: 2020-01-12 | Discharge: 2020-01-17 | DRG: 481 | Disposition: A | Discharge status: Skilled Nursing Facility | Payer: Medicare Other | Attending: Internal Medicine | Admitting: Internal Medicine

## 2020-01-12 ENCOUNTER — Other Ambulatory Visit: Payer: Self-pay

## 2020-01-12 ENCOUNTER — Encounter (HOSPITAL_COMMUNITY): Payer: Self-pay | Admitting: Emergency Medicine

## 2020-01-12 DIAGNOSIS — Z7901 Long term (current) use of anticoagulants: Secondary | ICD-10-CM

## 2020-01-12 DIAGNOSIS — I480 Paroxysmal atrial fibrillation: Secondary | ICD-10-CM | POA: Diagnosis not present

## 2020-01-12 DIAGNOSIS — M25551 Pain in right hip: Secondary | ICD-10-CM | POA: Diagnosis not present

## 2020-01-12 DIAGNOSIS — W010XXA Fall on same level from slipping, tripping and stumbling without subsequent striking against object, initial encounter: Secondary | ICD-10-CM | POA: Diagnosis present

## 2020-01-12 DIAGNOSIS — N1832 Chronic kidney disease, stage 3b: Secondary | ICD-10-CM | POA: Diagnosis present

## 2020-01-12 DIAGNOSIS — Y92009 Unspecified place in unspecified non-institutional (private) residence as the place of occurrence of the external cause: Secondary | ICD-10-CM

## 2020-01-12 DIAGNOSIS — Z7401 Bed confinement status: Secondary | ICD-10-CM | POA: Diagnosis not present

## 2020-01-12 DIAGNOSIS — I130 Hypertensive heart and chronic kidney disease with heart failure and stage 1 through stage 4 chronic kidney disease, or unspecified chronic kidney disease: Secondary | ICD-10-CM | POA: Diagnosis present

## 2020-01-12 DIAGNOSIS — M2550 Pain in unspecified joint: Secondary | ICD-10-CM | POA: Diagnosis not present

## 2020-01-12 DIAGNOSIS — Z90710 Acquired absence of both cervix and uterus: Secondary | ICD-10-CM | POA: Diagnosis not present

## 2020-01-12 DIAGNOSIS — D472 Monoclonal gammopathy: Secondary | ICD-10-CM | POA: Diagnosis present

## 2020-01-12 DIAGNOSIS — S199XXA Unspecified injury of neck, initial encounter: Secondary | ICD-10-CM | POA: Diagnosis not present

## 2020-01-12 DIAGNOSIS — J9811 Atelectasis: Secondary | ICD-10-CM | POA: Diagnosis present

## 2020-01-12 DIAGNOSIS — M1711 Unilateral primary osteoarthritis, right knee: Secondary | ICD-10-CM | POA: Diagnosis not present

## 2020-01-12 DIAGNOSIS — J320 Chronic maxillary sinusitis: Secondary | ICD-10-CM | POA: Diagnosis not present

## 2020-01-12 DIAGNOSIS — Z66 Do not resuscitate: Secondary | ICD-10-CM | POA: Diagnosis present

## 2020-01-12 DIAGNOSIS — I10 Essential (primary) hypertension: Secondary | ICD-10-CM | POA: Diagnosis present

## 2020-01-12 DIAGNOSIS — M810 Age-related osteoporosis without current pathological fracture: Secondary | ICD-10-CM | POA: Diagnosis present

## 2020-01-12 DIAGNOSIS — J0100 Acute maxillary sinusitis, unspecified: Secondary | ICD-10-CM | POA: Diagnosis not present

## 2020-01-12 DIAGNOSIS — J40 Bronchitis, not specified as acute or chronic: Secondary | ICD-10-CM | POA: Diagnosis present

## 2020-01-12 DIAGNOSIS — Z85828 Personal history of other malignant neoplasm of skin: Secondary | ICD-10-CM

## 2020-01-12 DIAGNOSIS — Z87891 Personal history of nicotine dependence: Secondary | ICD-10-CM | POA: Diagnosis not present

## 2020-01-12 DIAGNOSIS — E538 Deficiency of other specified B group vitamins: Secondary | ICD-10-CM | POA: Diagnosis present

## 2020-01-12 DIAGNOSIS — S72009A Fracture of unspecified part of neck of unspecified femur, initial encounter for closed fracture: Secondary | ICD-10-CM | POA: Diagnosis present

## 2020-01-12 DIAGNOSIS — D72829 Elevated white blood cell count, unspecified: Secondary | ICD-10-CM | POA: Diagnosis present

## 2020-01-12 DIAGNOSIS — J984 Other disorders of lung: Secondary | ICD-10-CM | POA: Diagnosis not present

## 2020-01-12 DIAGNOSIS — I5022 Chronic systolic (congestive) heart failure: Secondary | ICD-10-CM | POA: Diagnosis present

## 2020-01-12 DIAGNOSIS — D6859 Other primary thrombophilia: Secondary | ICD-10-CM | POA: Diagnosis present

## 2020-01-12 DIAGNOSIS — J811 Chronic pulmonary edema: Secondary | ICD-10-CM | POA: Diagnosis not present

## 2020-01-12 DIAGNOSIS — S0990XA Unspecified injury of head, initial encounter: Secondary | ICD-10-CM | POA: Diagnosis not present

## 2020-01-12 DIAGNOSIS — Z79899 Other long term (current) drug therapy: Secondary | ICD-10-CM

## 2020-01-12 DIAGNOSIS — N183 Chronic kidney disease, stage 3 unspecified: Secondary | ICD-10-CM | POA: Diagnosis present

## 2020-01-12 DIAGNOSIS — Z419 Encounter for procedure for purposes other than remedying health state, unspecified: Secondary | ICD-10-CM | POA: Diagnosis not present

## 2020-01-12 DIAGNOSIS — I482 Chronic atrial fibrillation, unspecified: Secondary | ICD-10-CM | POA: Diagnosis present

## 2020-01-12 DIAGNOSIS — R5381 Other malaise: Secondary | ICD-10-CM | POA: Diagnosis not present

## 2020-01-12 DIAGNOSIS — I2510 Atherosclerotic heart disease of native coronary artery without angina pectoris: Secondary | ICD-10-CM | POA: Diagnosis present

## 2020-01-12 DIAGNOSIS — N179 Acute kidney failure, unspecified: Secondary | ICD-10-CM | POA: Diagnosis not present

## 2020-01-12 DIAGNOSIS — Z955 Presence of coronary angioplasty implant and graft: Secondary | ICD-10-CM | POA: Diagnosis not present

## 2020-01-12 DIAGNOSIS — S72144A Nondisplaced intertrochanteric fracture of right femur, initial encounter for closed fracture: Secondary | ICD-10-CM | POA: Diagnosis not present

## 2020-01-12 DIAGNOSIS — E875 Hyperkalemia: Secondary | ICD-10-CM | POA: Diagnosis present

## 2020-01-12 DIAGNOSIS — J90 Pleural effusion, not elsewhere classified: Secondary | ICD-10-CM | POA: Diagnosis not present

## 2020-01-12 DIAGNOSIS — I7090 Unspecified atherosclerosis: Secondary | ICD-10-CM | POA: Diagnosis not present

## 2020-01-12 DIAGNOSIS — E785 Hyperlipidemia, unspecified: Secondary | ICD-10-CM | POA: Diagnosis present

## 2020-01-12 DIAGNOSIS — I6529 Occlusion and stenosis of unspecified carotid artery: Secondary | ICD-10-CM | POA: Diagnosis not present

## 2020-01-12 DIAGNOSIS — S72141D Displaced intertrochanteric fracture of right femur, subsequent encounter for closed fracture with routine healing: Secondary | ICD-10-CM | POA: Diagnosis not present

## 2020-01-12 DIAGNOSIS — R0902 Hypoxemia: Secondary | ICD-10-CM | POA: Diagnosis not present

## 2020-01-12 DIAGNOSIS — I517 Cardiomegaly: Secondary | ICD-10-CM | POA: Diagnosis not present

## 2020-01-12 DIAGNOSIS — R609 Edema, unspecified: Secondary | ICD-10-CM | POA: Diagnosis not present

## 2020-01-12 DIAGNOSIS — Z20822 Contact with and (suspected) exposure to covid-19: Secondary | ICD-10-CM | POA: Diagnosis present

## 2020-01-12 DIAGNOSIS — I4891 Unspecified atrial fibrillation: Secondary | ICD-10-CM | POA: Diagnosis not present

## 2020-01-12 DIAGNOSIS — L299 Pruritus, unspecified: Secondary | ICD-10-CM | POA: Diagnosis not present

## 2020-01-12 DIAGNOSIS — H3530 Unspecified macular degeneration: Secondary | ICD-10-CM | POA: Diagnosis not present

## 2020-01-12 DIAGNOSIS — M25461 Effusion, right knee: Secondary | ICD-10-CM | POA: Diagnosis not present

## 2020-01-12 DIAGNOSIS — I509 Heart failure, unspecified: Secondary | ICD-10-CM | POA: Diagnosis not present

## 2020-01-12 DIAGNOSIS — W19XXXA Unspecified fall, initial encounter: Secondary | ICD-10-CM | POA: Diagnosis not present

## 2020-01-12 DIAGNOSIS — D6869 Other thrombophilia: Secondary | ICD-10-CM | POA: Diagnosis not present

## 2020-01-12 DIAGNOSIS — S72141A Displaced intertrochanteric fracture of right femur, initial encounter for closed fracture: Secondary | ICD-10-CM | POA: Diagnosis not present

## 2020-01-12 DIAGNOSIS — S72001A Fracture of unspecified part of neck of right femur, initial encounter for closed fracture: Secondary | ICD-10-CM | POA: Diagnosis not present

## 2020-01-12 DIAGNOSIS — Z959 Presence of cardiac and vascular implant and graft, unspecified: Secondary | ICD-10-CM | POA: Diagnosis not present

## 2020-01-12 DIAGNOSIS — W19XXXS Unspecified fall, sequela: Secondary | ICD-10-CM

## 2020-01-12 DIAGNOSIS — R0602 Shortness of breath: Secondary | ICD-10-CM | POA: Diagnosis not present

## 2020-01-12 DIAGNOSIS — I5020 Unspecified systolic (congestive) heart failure: Secondary | ICD-10-CM | POA: Diagnosis not present

## 2020-01-12 DIAGNOSIS — W19XXXD Unspecified fall, subsequent encounter: Secondary | ICD-10-CM | POA: Diagnosis not present

## 2020-01-12 DIAGNOSIS — D510 Vitamin B12 deficiency anemia due to intrinsic factor deficiency: Secondary | ICD-10-CM | POA: Diagnosis not present

## 2020-01-12 DIAGNOSIS — Z4789 Encounter for other orthopedic aftercare: Secondary | ICD-10-CM | POA: Diagnosis not present

## 2020-01-12 DIAGNOSIS — I252 Old myocardial infarction: Secondary | ICD-10-CM | POA: Diagnosis not present

## 2020-01-12 DIAGNOSIS — D259 Leiomyoma of uterus, unspecified: Secondary | ICD-10-CM | POA: Diagnosis not present

## 2020-01-12 DIAGNOSIS — M7989 Other specified soft tissue disorders: Secondary | ICD-10-CM | POA: Diagnosis not present

## 2020-01-12 DIAGNOSIS — M47812 Spondylosis without myelopathy or radiculopathy, cervical region: Secondary | ICD-10-CM | POA: Diagnosis not present

## 2020-01-12 DIAGNOSIS — S72145A Nondisplaced intertrochanteric fracture of left femur, initial encounter for closed fracture: Secondary | ICD-10-CM | POA: Diagnosis not present

## 2020-01-12 LAB — CBC
HCT: 41.3 % (ref 36.0–46.0)
Hemoglobin: 12.1 g/dL (ref 12.0–15.0)
MCH: 27.9 pg (ref 26.0–34.0)
MCHC: 29.3 g/dL — ABNORMAL LOW (ref 30.0–36.0)
MCV: 95.2 fL (ref 80.0–100.0)
Platelets: 208 10*3/uL (ref 150–400)
RBC: 4.34 MIL/uL (ref 3.87–5.11)
RDW: 18.4 % — ABNORMAL HIGH (ref 11.5–15.5)
WBC: 10.3 10*3/uL (ref 4.0–10.5)
nRBC: 0 % (ref 0.0–0.2)

## 2020-01-12 LAB — BASIC METABOLIC PANEL
Anion gap: 13 (ref 5–15)
BUN: 21 mg/dL (ref 8–23)
CO2: 28 mmol/L (ref 22–32)
Calcium: 9.6 mg/dL (ref 8.9–10.3)
Chloride: 103 mmol/L (ref 98–111)
Creatinine, Ser: 1.91 mg/dL — ABNORMAL HIGH (ref 0.44–1.00)
GFR calc Af Amer: 25 mL/min — ABNORMAL LOW (ref >60)
GFR calc non Af Amer: 22 mL/min — ABNORMAL LOW (ref >60)
Glucose, Bld: 147 mg/dL — ABNORMAL HIGH (ref 70–99)
Potassium: 4.1 mmol/L (ref 3.5–5.1)
Sodium: 144 mmol/L (ref 135–145)

## 2020-01-12 LAB — PROTIME-INR
INR: 1.3 — ABNORMAL HIGH (ref 0.8–1.2)
Prothrombin Time: 15.4 seconds — ABNORMAL HIGH (ref 11.4–15.2)

## 2020-01-12 LAB — SARS CORONAVIRUS 2 BY RT PCR (HOSPITAL ORDER, PERFORMED IN ~~LOC~~ HOSPITAL LAB): SARS Coronavirus 2: NEGATIVE

## 2020-01-12 MED ORDER — MORPHINE SULFATE (PF) 2 MG/ML IV SOLN
0.5000 mg | Freq: Once | INTRAVENOUS | Status: AC
  Administered 2020-01-12: 0.5 mg via INTRAVENOUS
  Filled 2020-01-12: qty 1

## 2020-01-12 MED ORDER — MORPHINE SULFATE (PF) 2 MG/ML IV SOLN: 0.5000 mg | INTRAVENOUS | Status: DC | PRN

## 2020-01-12 MED ORDER — METHOCARBAMOL 1000 MG/10ML IJ SOLN
500.0000 mg | Freq: Once | INTRAVENOUS | Status: AC
  Administered 2020-01-12: 500 mg via INTRAVENOUS
  Filled 2020-01-12 (×3): qty 5

## 2020-01-12 MED ORDER — AMIODARONE HCL 200 MG PO TABS
200.0000 mg | ORAL_TABLET | Freq: Two times a day (BID) | ORAL | Status: DC
  Administered 2020-01-12 – 2020-01-17 (×9): 200 mg via ORAL
  Filled 2020-01-12 (×11): qty 1

## 2020-01-12 MED ORDER — METOPROLOL TARTRATE 50 MG PO TABS
50.0000 mg | ORAL_TABLET | Freq: Two times a day (BID) | ORAL | Status: DC
  Administered 2020-01-12 – 2020-01-13 (×4): 50 mg via ORAL
  Filled 2020-01-12: qty 1
  Filled 2020-01-12: qty 2
  Filled 2020-01-12 (×3): qty 1

## 2020-01-12 MED ORDER — MORPHINE SULFATE (PF) 2 MG/ML IV SOLN
1.0000 mg | INTRAVENOUS | Status: DC | PRN
  Administered 2020-01-12 – 2020-01-13 (×4): 1 mg via INTRAVENOUS
  Filled 2020-01-12 (×4): qty 1

## 2020-01-12 MED ORDER — MORPHINE SULFATE (PF) 2 MG/ML IV SOLN
INTRAVENOUS | Status: AC
  Filled 2020-01-12: qty 1

## 2020-01-12 NOTE — H&P (Signed)
History and Physical    Shelly Obrien [id number] DOB: 11/03/1922 DOA: 01/12/2020  PCP: [name], MD  Patient coming from: Home.  Chief Complaint: Fall.  HPI: Shelly Obrien is a 86 y.o. female with history of CAD status post stenting, A. fib, chronic kidney disease stage III, B12 deficiency presents to the ER the patient had a fall at home.  Patient states she was bending down to pick something when she lost balance and fell onto the floor hit her head did not lose consciousness.  Denies any chest pain or shortness of breath.  She took about 2 hours to gain his strength and to reach the phone and called her neighbor who in turn called the EMS and was brought to the ER.  ED Course: X-rays in the ER showed right hip fracture.  Dr. Alvan Dame orthopedic surgeon was notified.  Patient's last dose of Eliquis was yesterday morning.  EKG shows A. fib rate controlled labs are creatinine 1.9 Covid test negative CT head and C-spine unremarkable.  Review of Systems: As per HPI, rest all negative.   Past Medical History:  Diagnosis Date  . Aortic ectasia ([place])   . Arm fracture 2013   right, Dr. Sharen Heck  . B12 deficiency   . CAD S/P percutaneous coronary angioplasty    hx MI 04/ 1994  s/p  PCI to LAD  . CKD (chronic kidney disease), stage III   . Complication of anesthesia    hard to wake   . Gallstones   . History of basal cell carcinoma (BCC) excision    right eye area 08/ 2013  . History of kidney stones 01/20/2017  . History of MI (myocardial infarction) 09/1992   s/p  PCI to LAD  . History of squamous cell carcinoma excision    right lower leg 2015 ;  2016  . Hx of colonic polyps   . Hyperlipidemia   . Hypertension   . Macular degeneration   . Osteoporosis   . PAF (paroxysmal atrial fibrillation) ([place]) dx 02-24-2016   followed by cardiologist-- dr Mamie Nick. [name]  . Pernicious anemia   . PONV (postoperative nausea and vomiting)   . Rib fracture 07/2018   left  . Right ureteral  stone   . Wears glasses     Past Surgical History:  Procedure Laterality Date  . ABDOMINAL HYSTERECTOMY    . BREAST EXCISIONAL BIOPSY Left 11/02/2009   fibroadenoma (lumpectomy)  . BROW LIFT Right 04/07/2014   Procedure: REPAIR OF ENTROPION AND TRICHIASIS OF RIGHT LOWER EYE LID ;  Surgeon: Theodoro Kos, DO;  Location: [place];  Service: Plastics;  Laterality: Right;  . CARDIOVERSION N/A 07/18/2017   Procedure: CARDIOVERSION;  Surgeon: Skeet Latch, MD;  Location: [facility];  Service: Cardiovascular;  Laterality: N/A;  . CARDIOVERSION N/A 07/13/2019   Procedure: CARDIOVERSION;  Surgeon: Josue Hector, MD;  Location: [facility] ENDOSCOPY;  Service: Cardiovascular;  Laterality: N/A;  . CARDIOVERSION N/A 09/03/2019   Procedure: CARDIOVERSION;  Surgeon: Buford Dresser, MD;  Location: [facility] ENDOSCOPY;  Service: Cardiovascular;  Laterality: N/A;  . CATARACT EXTRACTION W/ INTRAOCULAR LENS  IMPLANT, BILATERAL    . CHOLECYSTECTOMY N/A 12/06/2017   Procedure: LAPAROSCOPIC CHOLECYSTECTOMY;  Surgeon: Jovita Kussmaul, MD;  Location: [facility];  Service: General;  Laterality: N/A;  . CORONARY ANGIOPLASTY  04/ 1994  dr Lia Foyer   PCI to LAD  . CORONARY ANGIOPLASTY    . CYSTOSCOPY W/ URETERAL STENT PLACEMENT Right 01/20/2017  Procedure: CYSTOSCOPY WITH RIGHT RETROGRADE PYELOGRAM/RIGHT URETERAL STENT PLACEMENT;  Surgeon: Alexis Frock, MD;  Location: [facility];  Service: Urology;  Laterality: Right;  . CYSTOSCOPY WITH RETROGRADE PYELOGRAM, URETEROSCOPY AND STENT PLACEMENT Right 02/15/2017   Procedure: CYSTOSCOPY WITH RETROGRADE PYELOGRAM, URETEROSCOPY, STONE BASKETRY AND STENT REPLACEMENT;  Surgeon: Alexis Frock, MD;  Location: [facility];  Service: Urology;  Laterality: Right;  . CYSTOSCOPY/RETROGRADE/URETEROSCOPY/STONE EXTRACTION WITH BASKET  yrs ago  . DILATION AND CURETTAGE OF UTERUS    . HOLMIUM LASER APPLICATION Right 8/85/0277   Procedure: HOLMIUM LASER APPLICATION;   Surgeon: Alexis Frock, MD;  Location: [facility];  Service: Urology;  Laterality: Right;  . KNEE SURGERY     right  . NEUROPLASTY / TRANSPOSITION ULNAR NERVE AT ELBOW Left 02-23-2000    [facility]   and Left Carpal Tunnel Release  . r eyelid cancer    . TONSILLECTOMY    . TRANSTHORACIC ECHOCARDIOGRAM  02/26/2016   moderate focal basal and mild concentric LVH,  ef 55-60%,  akinesis of the basal-midanteroseptal, inferoseptal and apicalinferior myocardium, study not sufficient to evaluation diastolic funciton due to atrial fib./  trivial PR/  mild TR  . UMBILICAL HERNIA REPAIR       reports that she has quit smoking. Her smoking use included cigarettes. She quit after 5.00 years of use. She has never used smokeless tobacco. She reports that she does not drink alcohol and does not use drugs.  Allergies  Allergen Reactions  . Antihistamines, Chlorpheniramine-Type Other (See Comments)    Pt states that she passed out.   [name] Penicillins Anaphylaxis and Other (See Comments)    Has patient had a PCN reaction causing immediate rash, facial/tongue/throat swelling, SOB or lightheadedness with hypotension: Yes Has patient had a PCN reaction causing severe rash involving mucus membranes or skin necrosis: No Has patient had a PCN reaction that required hospitalization No Has patient had a PCN reaction occurring within the last 10 years: No If all of the above answers are "NO", then may proceed with Cephalosporin use.  . Contrast Media [Iodinated Diagnostic Agents] Hives  . Sulfonamide Derivatives Other (See Comments)    Reaction:  Unknown   . Atorvastatin Other (See Comments)    myalgias  . Pheniramine Other (See Comments)    Pt states that she passed out.     Family History  Problem Relation Age of Onset  . Coronary artery disease Other        positive family hx of  . Cancer Other        family hx of  . Brain cancer Son   . Liver cancer Daughter   . Neuropathy Neg Hx      Prior to Admission medications   Medication Sig Start Date End Date Taking? Authorizing Provider  amiodarone (PACERONE) 200 MG tablet Take 200 mg by mouth 2 (two) times daily. 11/02/19  Yes [provider]  ELIQUIS 5 MG TABS tablet Take 5 mg by mouth 2 (two) times daily. 11/22/19  Yes [provider]  fluocinonide (LIDEX) 0.05 % external solution Apply 1 application topically as needed (itching).  06/15/19  Yes [provider]  furosemide (LASIX) 80 MG tablet Take 80 mg by mouth daily. 12/19/19  Yes [provider]  metoprolol tartrate (LOPRESSOR) 50 MG tablet Take 75mg  by mouth twice daily Patient taking differently: Take 50 mg by mouth 2 (two) times daily.  10/12/19  Yes Sherran Needs, NP  Multiple Vitamins-Minerals (ICAPS) CAPS Take  1 capsule by mouth 2 (two) times daily.    Yes [provider]  Phenylephrine HCl (NOSE DROPS NA) Place 1 drop into the nose daily as needed (alleriges).   Yes [provider]  triamcinolone (KENALOG) 0.025 % cream Apply 1 application topically as needed (itching).  06/15/19  Yes [provider]  apixaban (ELIQUIS) 2.5 MG TABS tablet Take 1 tablet (2.5 mg total) by mouth 2 (two) times daily. Patient not taking: Reported on 01/12/2020 11/03/19 01/11/29  Antonieta Pert, MD  DHA-EPA-Flaxseed Oil-Vitamin E (THERA TEARS NUTRITION PO) Take 1 drop by mouth daily as needed (watery eyes).    [provider]  Lactobacillus (PROBIOTIC ACIDOPHILUS) TABS Take 1 tablet by mouth in the morning and at bedtime. Patient not taking: Reported on 10/29/2019 10/20/19   Mercy Riding, MD    Physical Exam: Constitutional: Moderately built and nourished. Vitals:   01/12/20 0048 01/12/20 0304  BP: (!) 150/101 (!) 158/98  Pulse: 75 83  Resp: 16 19  Temp: 97.8 F (36.6 C)   TempSrc: Oral   SpO2: 97% 95%   Eyes: Anicteric no pallor. ENMT: No discharge from the ears eyes nose or mouth. Neck: No mass felt.  No neck rigidity.  Respiratory: No rhonchi or crepitations. Cardiovascular: S1-S2 heard. Abdomen: Soft nontender bowel sounds present. Musculoskeletal: Pain on moving right hip. Skin: Chronic skin changes on the lower extremities. Neurologic: Alert awake oriented to time place and person.  Moves all extremities. Psychiatric: Appears normal.  Normal affect.   Labs on Admission: I have personally reviewed following labs and imaging studies  CBC: Recent Labs  Lab 01/12/20 0241  WBC 10.3  HGB 12.1  HCT 41.3  MCV 95.2  PLT 833   Basic Metabolic Panel: Recent Labs  Lab 01/12/20 0241  NA 144  K 4.1  CL 103  CO2 28  GLUCOSE 147*  BUN 21  CREATININE 1.91*  CALCIUM 9.6   GFR: CrCl cannot be calculated (Unknown ideal weight.). Liver Function Tests: No results for input(s): AST, ALT, ALKPHOS, BILITOT, PROT, ALBUMIN in the last 168 hours. No results for input(s): LIPASE, AMYLASE in the last 168 hours. No results for input(s): AMMONIA in the last 168 hours. Coagulation Profile: Recent Labs  Lab 01/12/20 0241  INR 1.3*   Cardiac Enzymes: No results for input(s): CKTOTAL, CKMB, CKMBINDEX, TROPONINI in the last 168 hours. BNP (last 3 results) No results for input(s): PROBNP in the last 8760 hours. HbA1C: No results for input(s): HGBA1C in the last 72 hours. CBG: No results for input(s): GLUCAP in the last 168 hours. Lipid Profile: No results for input(s): CHOL, HDL, LDLCALC, TRIG, CHOLHDL, LDLDIRECT in the last 72 hours. Thyroid Function Tests: No results for input(s): TSH, T4TOTAL, FREET4, T3FREE, THYROIDAB in the last 72 hours. Anemia Panel: No results for input(s): VITAMINB12, FOLATE, FERRITIN, TIBC, IRON, RETICCTPCT in the last 72 hours. Urine analysis:    Component Value Date/Time   COLORURINE YELLOW 12/14/2017 [place] 12/14/2017 0757   LABSPEC 1.020 04/25/2018 0925   PHURINE 6.5 04/25/2018 0925   GLUCOSEU NEGATIVE 04/25/2018 0925   HGBUR NEGATIVE 04/25/2018  0925   BILIRUBINUR NEGATIVE 04/25/2018 0925   KETONESUR NEGATIVE 04/25/2018 0925   PROTEINUR 30 (A) 04/25/2018 0925   UROBILINOGEN 0.2 04/25/2018 0925   NITRITE NEGATIVE 04/25/2018 0925   LEUKOCYTESUR TRACE (A) 04/25/2018 0925   Sepsis Labs: @LABRCNTIP (procalcitonin:4,lacticidven:4) )No results found for this or any previous visit (from the past 240 hour(s)).   Radiological  Exams on Admission: CT Head Wo Contrast  Result Date: 01/12/2020 CLINICAL DATA:  86 year old female status post fall. On blood thinners. EXAM: CT HEAD WITHOUT CONTRAST TECHNIQUE: Contiguous axial images were obtained from the base of the skull through the vertex without intravenous contrast. COMPARISON:  Brain MRI 01/26/2017. FINDINGS: Brain: Stable cerebral volume since 2018. No midline shift, mass effect, or evidence of intracranial mass lesion. No ventriculomegaly. No acute intracranial hemorrhage identified. No cortically based acute infarct identified. Stable mild for age white matter changes. Vascular: Calcified atherosclerosis at the skull base. Skull: No skull fracture identified. Sinuses/Orbits: Chronic right maxillary sinusitis with periosteal thickening and partially calcified retained secretions. Chronic left maxillary mucous retention cysts. Other Visualized paranasal sinuses and mastoids are clear. Other: No acute orbit or scalp soft tissue injury identified. IMPRESSION: 1. No acute intracranial abnormality or acute traumatic injury identified. 2. Chronic right maxillary sinusitis. Electronically Signed   By: Genevie Ann M.D.   On: 01/12/2020 02:16   CT Cervical Spine Wo Contrast  Result Date: 01/12/2020 CLINICAL DATA:  86 year old female status post fall. On blood thinners. EXAM: CT CERVICAL SPINE WITHOUT CONTRAST TECHNIQUE: Multidetector CT imaging of the cervical spine was performed without intravenous contrast. Multiplanar CT image reconstructions were also generated. COMPARISON:  Head CT today reported  separately. FINDINGS: Alignment: Mildly exaggerated cervical lordosis. Cervicothoracic junction alignment is within normal limits. Bilateral posterior element alignment is within normal limits. Skull base and vertebrae: Visualized skull base is intact. No atlanto-occipital dissociation. No acute osseous abnormality identified. Soft tissues and spinal canal: No prevertebral fluid or swelling. No visible canal hematoma. Negative visible noncontrast neck soft tissues aside from calcified carotid atherosclerosis. Disc levels: C2-C3 ankylosis. Mild for age superimposed cervical spine degeneration. Upper chest: Visible upper thoracic levels appear grossly intact. Mild apical lung scarring. IMPRESSION: 1. No acute traumatic injury identified in the cervical spine. 2. C2-C3 ankylosis with mild for age cervical spine degeneration elsewhere. Electronically Signed   By: Genevie Ann M.D.   On: 01/12/2020 02:19   DG Chest Port 1 View  Result Date: 01/12/2020 CLINICAL DATA:  Fall EXAM: PORTABLE CHEST 1 VIEW COMPARISON:  Oct 29, 2019 FINDINGS: There is mild cardiomegaly. There is mildly increased interstitial markings seen at both lung bases as on the prior exam, likely chronic lung changes. Aortic knob calcifications are noted. Advanced left shoulder osteoarthritis is seen. IMPRESSION: No active disease. Electronically Signed   By: Prudencio Pair M.D.   On: 01/12/2020 02:54   DG Hip Unilat W or Wo Pelvis 2-3 Views Left  Result Date: 01/12/2020 CLINICAL DATA:  Fall and pain EXAM: DG HIP (WITH OR WITHOUT PELVIS) 2-3V LEFT COMPARISON:  None. FINDINGS: There is a nondisplaced right intratrochanteric femoral fracture. The femoral head is still well seated within the acetabulum. No other definite fracture is identified. Overlying soft tissue swelling is seen. Calcified probable uterine fibroid in the deep pelvis. IMPRESSION: Nondisplaced right intratrochanteric fracture Electronically Signed   By: Prudencio Pair M.D.   On: 01/12/2020  02:11    EKG: Independently reviewed.  A. fib rate controlled.  Assessment/Plan Active Problems:   Essential hypertension   CAD (coronary artery disease)   CKD (chronic kidney disease) stage 3, GFR 30-59 ml/min (HCC)   PAF (paroxysmal atrial fibrillation) (HCC)   DNR (do not resuscitate)   MGUS (monoclonal gammopathy of unknown significance)   Hip fracture (HCC)   Hip fracture, right ([place])    1. Right hip fracture status post mechanical fall.  Patient  last dose of Eliquis was yesterday morning.  We will keep patient n.p.o. and hold Eliquis for now patient agreeable to holding Eliquis.  Dr. Alvan Dame orthopedic surgeon has been consulted.  Pain relief medications. 2. A. fib rate controlled on beta-blockers Eliquis on hold for surgery.  Last dose was yesterday morning.  Continue amiodarone and beta-blocker 3. Acute on chronic kidney disease stage III creatinine increased from baseline.  Will hold off Lasix for now. 4. History of CHF holding Lasix for surgery.  Restart once patient is post surgery if hemodynamically stable. 5. CAD status post stenting denies any chest pain. 6. History of B12 deficiency anemia.  Since patient has right hip fracture will need surgical management and will need more than 2 midnight stay in inpatient status.   DVT prophylaxis: SCDs.  Start anticoagulation after surgery when okay with orthopedic surgery. Code Status: DNR. Family Communication: Discussed with patient. Disposition Plan: May need rehab. Consults called: Orthopedics. Admission status: Inpatient.   Rise Patience MD [facility] Pager [phone].  If 7PM-7AM, please contact night-coverage [email or web address] Password [email or web address]  01/12/2020, 4:08 AM

## 2020-01-12 NOTE — H&P (View-Only) (Signed)
Reason for Consult: right hip fracture Referring Physician:  Hal Hope, MD  Shelly Obrien is an 86 y.o. female.  HPI: Shelly Obrien is a 86 y.o. female with history of CAD status post stenting, A. fib, chronic kidney disease stage III, B12 deficiency presents to the ER the patient had a fall at home.  Patient states she was bending down to pick something when she lost balance and fell onto the floor hit her head did not lose consciousness.  Denies any chest pain or shortness of breath.  She took about 2 hours to gain his strength and to reach the phone and called her neighbor who in turn called the EMS and was brought to the ER.  Complains of only right hip pain.  No UE complaints, no LLE issues  Past Medical History:  Diagnosis Date  . Aortic ectasia ([place])   . Arm fracture 2013   right, Dr. Sharen Heck  . B12 deficiency   . CAD S/P percutaneous coronary angioplasty    hx MI 04/ 1994  s/p  PCI to LAD  . CKD (chronic kidney disease), stage III   . Complication of anesthesia    hard to wake   . Gallstones   . History of basal cell carcinoma (BCC) excision    right eye area 08/ 2013  . History of kidney stones 01/20/2017  . History of MI (myocardial infarction) 09/1992   s/p  PCI to LAD  . History of squamous cell carcinoma excision    right lower leg 2015 ;  2016  . Hx of colonic polyps   . Hyperlipidemia   . Hypertension   . Macular degeneration   . Osteoporosis   . PAF (paroxysmal atrial fibrillation) ([place]) dx 02-24-2016   followed by cardiologist-- dr Mamie Nick. [name]  . Pernicious anemia   . PONV (postoperative nausea and vomiting)   . Rib fracture 07/2018   left  . Right ureteral stone   . Wears glasses     Past Surgical History:  Procedure Laterality Date  . ABDOMINAL HYSTERECTOMY    . BREAST EXCISIONAL BIOPSY Left 11/02/2009   fibroadenoma (lumpectomy)  . BROW LIFT Right 04/07/2014   Procedure: REPAIR OF ENTROPION AND TRICHIASIS OF RIGHT LOWER EYE LID ;  Surgeon: Theodoro Kos, DO;  Location: [facility];  Service: Plastics;  Laterality: Right;  . CARDIOVERSION N/A 07/18/2017   Procedure: CARDIOVERSION;  Surgeon: Skeet Latch, MD;  Location: [facility];  Service: Cardiovascular;  Laterality: N/A;  . CARDIOVERSION N/A 07/13/2019   Procedure: CARDIOVERSION;  Surgeon: Josue Hector, MD;  Location: [facility] ENDOSCOPY;  Service: Cardiovascular;  Laterality: N/A;  . CARDIOVERSION N/A 09/03/2019   Procedure: CARDIOVERSION;  Surgeon: Buford Dresser, MD;  Location: [facility] ENDOSCOPY;  Service: Cardiovascular;  Laterality: N/A;  . CATARACT EXTRACTION W/ INTRAOCULAR LENS  IMPLANT, BILATERAL    . CHOLECYSTECTOMY N/A 12/06/2017   Procedure: LAPAROSCOPIC CHOLECYSTECTOMY;  Surgeon: Jovita Kussmaul, MD;  Location: [facility];  Service: General;  Laterality: N/A;  . CORONARY ANGIOPLASTY  04/ 1994  dr Lia Foyer   PCI to LAD  . CORONARY ANGIOPLASTY    . CYSTOSCOPY W/ URETERAL STENT PLACEMENT Right 01/20/2017   Procedure: CYSTOSCOPY WITH RIGHT RETROGRADE PYELOGRAM/RIGHT URETERAL STENT PLACEMENT;  Surgeon: Alexis Frock, MD;  Location: [facility];  Service: Urology;  Laterality: Right;  . CYSTOSCOPY WITH RETROGRADE PYELOGRAM, URETEROSCOPY AND STENT PLACEMENT Right 02/15/2017   Procedure: CYSTOSCOPY WITH RETROGRADE PYELOGRAM, URETEROSCOPY, STONE BASKETRY AND STENT REPLACEMENT;  Surgeon: Tresa Moore,  Hubbard Robinson, MD;  Location: [facility];  Service: Urology;  Laterality: Right;  . CYSTOSCOPY/RETROGRADE/URETEROSCOPY/STONE EXTRACTION WITH BASKET  yrs ago  . DILATION AND CURETTAGE OF UTERUS    . HOLMIUM LASER APPLICATION Right 5/95/6387   Procedure: HOLMIUM LASER APPLICATION;  Surgeon: Alexis Frock, MD;  Location: [facility];  Service: Urology;  Laterality: Right;  . KNEE SURGERY     right  . NEUROPLASTY / TRANSPOSITION ULNAR NERVE AT ELBOW Left 02-23-2000    [facility]   and Left Carpal Tunnel Release  . r eyelid cancer    . TONSILLECTOMY    . TRANSTHORACIC  ECHOCARDIOGRAM  02/26/2016   moderate focal basal and mild concentric LVH,  ef 55-60%,  akinesis of the basal-midanteroseptal, inferoseptal and apicalinferior myocardium, study not sufficient to evaluation diastolic funciton due to atrial fib./  trivial PR/  mild TR  . UMBILICAL HERNIA REPAIR      Family History  Problem Relation Age of Onset  . Coronary artery disease Other        positive family hx of  . Cancer Other        family hx of  . Brain cancer Son   . Liver cancer Daughter   . Neuropathy Neg Hx     Social History:  reports that she has quit smoking. Her smoking use included cigarettes. She quit after 5.00 years of use. She has never used smokeless tobacco. She reports that she does not drink alcohol and does not use drugs.  Allergies:  Allergies  Allergen Reactions  . Antihistamines, Chlorpheniramine-Type Other (See Comments)    Pt states that she passed out.   [name] Penicillins Anaphylaxis and Other (See Comments)    Has patient had a PCN reaction causing immediate rash, facial/tongue/throat swelling, SOB or lightheadedness with hypotension: Yes Has patient had a PCN reaction causing severe rash involving mucus membranes or skin necrosis: No Has patient had a PCN reaction that required hospitalization No Has patient had a PCN reaction occurring within the last 10 years: No If all of the above answers are "NO", then may proceed with Cephalosporin use.  . Contrast Media [Iodinated Diagnostic Agents] Hives  . Sulfonamide Derivatives Other (See Comments)    Reaction:  Unknown   . Atorvastatin Other (See Comments)    myalgias  . Pheniramine Other (See Comments)    Pt states that she passed out.     Medications:  I have reviewed the patient's current medications. Scheduled: . amiodarone  200 mg Oral BID  . metoprolol tartrate  50 mg Oral BID  . morphine        Results for orders placed or performed during the hospital encounter of 01/12/20 (from the past 24 hour(s))   SARS Coronavirus 2 by RT PCR (hospital order, performed in [place] hospital lab) Nasopharyngeal Nasopharyngeal Swab     Status: None   Collection Time: 01/12/20  2:28 AM   Specimen: Nasopharyngeal Swab  Result Value Ref Range   SARS Coronavirus 2 NEGATIVE NEGATIVE  CBC     Status: Abnormal   Collection Time: 01/12/20  2:41 AM  Result Value Ref Range   WBC 10.3 4.0 - 10.5 K/uL   RBC 4.34 3.87 - 5.11 MIL/uL   Hemoglobin 12.1 12.0 - 15.0 g/dL   HCT 41.3 36 - 46 %   MCV 95.2 80.0 - 100.0 fL   MCH 27.9 26.0 - 34.0 pg   MCHC 29.3 (L) 30.0 - 36.0 g/dL   RDW  18.4 (H) 11.5 - 15.5 %   Platelets 208 150 - 400 K/uL   nRBC 0.0 0.0 - 0.2 %  Basic metabolic panel     Status: Abnormal   Collection Time: 01/12/20  2:41 AM  Result Value Ref Range   Sodium 144 135 - 145 mmol/L   Potassium 4.1 3.5 - 5.1 mmol/L   Chloride 103 98 - 111 mmol/L   CO2 28 22 - 32 mmol/L   Glucose, Bld 147 (H) 70 - 99 mg/dL   BUN 21 8 - 23 mg/dL   Creatinine, Ser 1.91 (H) 0.44 - 1.00 mg/dL   Calcium 9.6 8.9 - 10.3 mg/dL   GFR calc non Af Amer 22 (L) >60 mL/min   GFR calc Af Amer 25 (L) >60 mL/min   Anion gap 13 5 - 15  Protime-INR     Status: Abnormal   Collection Time: 01/12/20  2:41 AM  Result Value Ref Range   Prothrombin Time 15.4 (H) 11.4 - 15.2 seconds   INR 1.3 (H) 0.8 - 1.2    X-ray: CLINICAL DATA:  Fall and pain  EXAM: DG HIP (WITH OR WITHOUT PELVIS) 2-3V LEFT  COMPARISON:  None.  FINDINGS: There is a nondisplaced right intratrochanteric femoral fracture. The femoral head is still well seated within the acetabulum. No other definite fracture is identified. Overlying soft tissue swelling is seen. Calcified probable uterine fibroid in the deep pelvis.  IMPRESSION: Nondisplaced right intratrochanteric fracture   Electronically Signed   By: Prudencio Pair M.D.  ROS: As per HPI, rest all negative.  Blood pressure (!) 131/92, pulse 82, temperature 97.8 F (36.6 C), temperature  source Oral, resp. rate 11, SpO2 95 %.  Physical Exam: Eyes: Anicteric no pallor. ENMT: No discharge from the ears eyes nose or mouth. Neck: No mass felt.  No neck rigidity. Respiratory: No rhonchi or crepitations. Cardiovascular: S1-S2 heard. Abdomen: Soft nontender bowel sounds present. Musculoskeletal: Pain on moving right hip. Some shortening and externally rotated Skin: Chronic skin changes on the lower extremities. Neurologic: Alert awake oriented to time place and person.  Moves all extremities. Psychiatric: Appears normal.  Normal affect.   Assessment/Plan: 1. Right basi-cervical/intertrochanteric hip fracture  Plan: Reviewed injury and options for treatment with her and her son On eliquis - hold for now OR likely tomorrow for ORIF vs hemi arthroplasty and will be performed by one of my partners Regular diet for now  Mauri Pole 01/12/2020, 10:02 AM

## 2020-01-12 NOTE — ED Notes (Signed)
Wasted 0.5 ml of morphine with Garlon Hatchet RN

## 2020-01-12 NOTE — Consult Note (Signed)
Reason for Consult: right hip fracture Referring Physician:  Hal Hope, MD  Shelly Obrien is an 86 y.o. female.  HPI: Shelly Obrien is a 86 y.o. female with history of CAD status post stenting, A. fib, chronic kidney disease stage III, B12 deficiency presents to the ER the patient had a fall at home.  Patient states she was bending down to pick something when she lost balance and fell onto the floor hit her head did not lose consciousness.  Denies any chest pain or shortness of breath.  She took about 2 hours to gain his strength and to reach the phone and called her neighbor who in turn called the EMS and was brought to the ER.  Complains of only right hip pain.  No UE complaints, no LLE issues  Past Medical History:  Diagnosis Date  . Aortic ectasia ([place])   . Arm fracture 2013   right, Dr. Sharen Heck  . B12 deficiency   . CAD S/P percutaneous coronary angioplasty    hx MI 04/ 1994  s/p  PCI to LAD  . CKD (chronic kidney disease), stage III   . Complication of anesthesia    hard to wake   . Gallstones   . History of basal cell carcinoma (BCC) excision    right eye area 08/ 2013  . History of kidney stones 01/20/2017  . History of MI (myocardial infarction) 09/1992   s/p  PCI to LAD  . History of squamous cell carcinoma excision    right lower leg 2015 ;  2016  . Hx of colonic polyps   . Hyperlipidemia   . Hypertension   . Macular degeneration   . Osteoporosis   . PAF (paroxysmal atrial fibrillation) ([place]) dx 02-24-2016   followed by cardiologist-- dr Mamie Nick. [name]  . Pernicious anemia   . PONV (postoperative nausea and vomiting)   . Rib fracture 07/2018   left  . Right ureteral stone   . Wears glasses     Past Surgical History:  Procedure Laterality Date  . ABDOMINAL HYSTERECTOMY    . BREAST EXCISIONAL BIOPSY Left 11/02/2009   fibroadenoma (lumpectomy)  . BROW LIFT Right 04/07/2014   Procedure: REPAIR OF ENTROPION AND TRICHIASIS OF RIGHT LOWER EYE LID ;  Surgeon: Theodoro Kos, DO;  Location: [place];  Service: Plastics;  Laterality: Right;  . CARDIOVERSION N/A 07/18/2017   Procedure: CARDIOVERSION;  Surgeon: Skeet Latch, MD;  Location: [place];  Service: Cardiovascular;  Laterality: N/A;  . CARDIOVERSION N/A 07/13/2019   Procedure: CARDIOVERSION;  Surgeon: Josue Hector, MD;  Location: [facility] ENDOSCOPY;  Service: Cardiovascular;  Laterality: N/A;  . CARDIOVERSION N/A 09/03/2019   Procedure: CARDIOVERSION;  Surgeon: Buford Dresser, MD;  Location: [facility] ENDOSCOPY;  Service: Cardiovascular;  Laterality: N/A;  . CATARACT EXTRACTION W/ INTRAOCULAR LENS  IMPLANT, BILATERAL    . CHOLECYSTECTOMY N/A 12/06/2017   Procedure: LAPAROSCOPIC CHOLECYSTECTOMY;  Surgeon: Jovita Kussmaul, MD;  Location: [facility];  Service: General;  Laterality: N/A;  . CORONARY ANGIOPLASTY  04/ 1994  dr Lia Foyer   PCI to LAD  . CORONARY ANGIOPLASTY    . CYSTOSCOPY W/ URETERAL STENT PLACEMENT Right 01/20/2017   Procedure: CYSTOSCOPY WITH RIGHT RETROGRADE PYELOGRAM/RIGHT URETERAL STENT PLACEMENT;  Surgeon: Alexis Frock, MD;  Location: [facility];  Service: Urology;  Laterality: Right;  . CYSTOSCOPY WITH RETROGRADE PYELOGRAM, URETEROSCOPY AND STENT PLACEMENT Right 02/15/2017   Procedure: CYSTOSCOPY WITH RETROGRADE PYELOGRAM, URETEROSCOPY, STONE BASKETRY AND STENT REPLACEMENT;  Surgeon: Tresa Moore,  Hubbard Robinson, MD;  Location: [facility];  Service: Urology;  Laterality: Right;  . CYSTOSCOPY/RETROGRADE/URETEROSCOPY/STONE EXTRACTION WITH BASKET  yrs ago  . DILATION AND CURETTAGE OF UTERUS    . HOLMIUM LASER APPLICATION Right 01/14/7516   Procedure: HOLMIUM LASER APPLICATION;  Surgeon: Alexis Frock, MD;  Location: [facility];  Service: Urology;  Laterality: Right;  . KNEE SURGERY     right  . NEUROPLASTY / TRANSPOSITION ULNAR NERVE AT ELBOW Left 02-23-2000    [facility]   and Left Carpal Tunnel Release  . r eyelid cancer    . TONSILLECTOMY    . TRANSTHORACIC  ECHOCARDIOGRAM  02/26/2016   moderate focal basal and mild concentric LVH,  ef 55-60%,  akinesis of the basal-midanteroseptal, inferoseptal and apicalinferior myocardium, study not sufficient to evaluation diastolic funciton due to atrial fib./  trivial PR/  mild TR  . UMBILICAL HERNIA REPAIR      Family History  Problem Relation Age of Onset  . Coronary artery disease Other        positive family hx of  . Cancer Other        family hx of  . Brain cancer Son   . Liver cancer Daughter   . Neuropathy Neg Hx     Social History:  reports that she has quit smoking. Her smoking use included cigarettes. She quit after 5.00 years of use. She has never used smokeless tobacco. She reports that she does not drink alcohol and does not use drugs.  Allergies:  Allergies  Allergen Reactions  . Antihistamines, Chlorpheniramine-Type Other (See Comments)    Pt states that she passed out.   [name] Penicillins Anaphylaxis and Other (See Comments)    Has patient had a PCN reaction causing immediate rash, facial/tongue/throat swelling, SOB or lightheadedness with hypotension: Yes Has patient had a PCN reaction causing severe rash involving mucus membranes or skin necrosis: No Has patient had a PCN reaction that required hospitalization No Has patient had a PCN reaction occurring within the last 10 years: No If all of the above answers are "NO", then may proceed with Cephalosporin use.  . Contrast Media [Iodinated Diagnostic Agents] Hives  . Sulfonamide Derivatives Other (See Comments)    Reaction:  Unknown   . Atorvastatin Other (See Comments)    myalgias  . Pheniramine Other (See Comments)    Pt states that she passed out.     Medications:  I have reviewed the patient's current medications. Scheduled: . amiodarone  200 mg Oral BID  . metoprolol tartrate  50 mg Oral BID  . morphine        Results for orders placed or performed during the hospital encounter of 01/12/20 (from the past 24 hour(s))   SARS Coronavirus 2 by RT PCR (hospital order, performed in [place] hospital lab) Nasopharyngeal Nasopharyngeal Swab     Status: None   Collection Time: 01/12/20  2:28 AM   Specimen: Nasopharyngeal Swab  Result Value Ref Range   SARS Coronavirus 2 NEGATIVE NEGATIVE  CBC     Status: Abnormal   Collection Time: 01/12/20  2:41 AM  Result Value Ref Range   WBC 10.3 4.0 - 10.5 K/uL   RBC 4.34 3.87 - 5.11 MIL/uL   Hemoglobin 12.1 12.0 - 15.0 g/dL   HCT 41.3 36 - 46 %   MCV 95.2 80.0 - 100.0 fL   MCH 27.9 26.0 - 34.0 pg   MCHC 29.3 (L) 30.0 - 36.0 g/dL   RDW  18.4 (H) 11.5 - 15.5 %   Platelets 208 150 - 400 K/uL   nRBC 0.0 0.0 - 0.2 %  Basic metabolic panel     Status: Abnormal   Collection Time: 01/12/20  2:41 AM  Result Value Ref Range   Sodium 144 135 - 145 mmol/L   Potassium 4.1 3.5 - 5.1 mmol/L   Chloride 103 98 - 111 mmol/L   CO2 28 22 - 32 mmol/L   Glucose, Bld 147 (H) 70 - 99 mg/dL   BUN 21 8 - 23 mg/dL   Creatinine, Ser 1.91 (H) 0.44 - 1.00 mg/dL   Calcium 9.6 8.9 - 10.3 mg/dL   GFR calc non Af Amer 22 (L) >60 mL/min   GFR calc Af Amer 25 (L) >60 mL/min   Anion gap 13 5 - 15  Protime-INR     Status: Abnormal   Collection Time: 01/12/20  2:41 AM  Result Value Ref Range   Prothrombin Time 15.4 (H) 11.4 - 15.2 seconds   INR 1.3 (H) 0.8 - 1.2    X-ray: CLINICAL DATA:  Fall and pain  EXAM: DG HIP (WITH OR WITHOUT PELVIS) 2-3V LEFT  COMPARISON:  None.  FINDINGS: There is a nondisplaced right intratrochanteric femoral fracture. The femoral head is still well seated within the acetabulum. No other definite fracture is identified. Overlying soft tissue swelling is seen. Calcified probable uterine fibroid in the deep pelvis.  IMPRESSION: Nondisplaced right intratrochanteric fracture   Electronically Signed   By: Prudencio Pair M.D.  ROS: As per HPI, rest all negative.  Blood pressure (!) 131/92, pulse 82, temperature 97.8 F (36.6 C), temperature  source Oral, resp. rate 11, SpO2 95 %.  Physical Exam: Eyes: Anicteric no pallor. ENMT: No discharge from the ears eyes nose or mouth. Neck: No mass felt.  No neck rigidity. Respiratory: No rhonchi or crepitations. Cardiovascular: S1-S2 heard. Abdomen: Soft nontender bowel sounds present. Musculoskeletal: Pain on moving right hip. Some shortening and externally rotated Skin: Chronic skin changes on the lower extremities. Neurologic: Alert awake oriented to time place and person.  Moves all extremities. Psychiatric: Appears normal.  Normal affect.   Assessment/Plan: 1. Right basi-cervical/intertrochanteric hip fracture  Plan: Reviewed injury and options for treatment with her and her son On eliquis - hold for now OR likely tomorrow for ORIF vs hemi arthroplasty and will be performed by one of my partners Regular diet for now  Mauri Pole 01/12/2020, 10:02 AM

## 2020-01-12 NOTE — Progress Notes (Signed)
Patient ID: MARTYNA THORNS, female   DOB: May 27, 1923, 86 y.o.   MRN: [id number]   This is a no charge progress note.  Patient seen and examined.  H&P reviewed.JAYDEN RUDGE is a 86 y.o. female with history of CAD status post stenting, A. fib, chronic kidney disease stage III, B12 deficiency presents to the ER the patient had a fall at home.  Patient states she was bending down to pick something when she lost balance and fell onto the floor hit her head did not lose consciousness. X-rays in the ER showed right hip fracture.  Ortho consulted.  EKG with A. fib rate controlled.    A/P:  Plan for or in a.m. Npo at [facility] tonight

## 2020-01-12 NOTE — Plan of Care (Signed)

## 2020-01-12 NOTE — ED Provider Notes (Signed)
[facility] EMERGENCY DEPARTMENT Provider Note   CSN: [id number] Arrival date & time: 01/12/20  0025     History Chief Complaint  Patient presents with  . Fall  . Hip Pain    Shelly Obrien is a 86 y.o. female.  Patient presents to the emergency department for evaluation after a fall.  Patient reports that she tried to lean over to pick up her cane and lost her balance, falling forward to the ground.  Patient reports severe right hip pain when she tries to move the hip.  She does not think that she hit her head.        Past Medical History:  Diagnosis Date  . Aortic ectasia ([place])   . Arm fracture 2013   right, Dr. Sharen Heck  . B12 deficiency   . CAD S/P percutaneous coronary angioplasty    hx MI 04/ 1994  s/p  PCI to LAD  . CKD (chronic kidney disease), stage III   . Complication of anesthesia    hard to wake   . Gallstones   . History of basal cell carcinoma (BCC) excision    right eye area 08/ 2013  . History of kidney stones 01/20/2017  . History of MI (myocardial infarction) 09/1992   s/p  PCI to LAD  . History of squamous cell carcinoma excision    right lower leg 2015 ;  2016  . Hx of colonic polyps   . Hyperlipidemia   . Hypertension   . Macular degeneration   . Osteoporosis   . PAF (paroxysmal atrial fibrillation) ([place]) dx 02-24-2016   followed by cardiologist-- dr Mamie Nick. [name]  . Pernicious anemia   . PONV (postoperative nausea and vomiting)   . Rib fracture 07/2018   left  . Right ureteral stone   . Wears glasses     Patient Active Problem List   Diagnosis Date Noted  . Severe sepsis ([place])   . Cellulitis of right leg 10/30/2019  . Hyperkalemia 10/30/2019  . SIRS (systemic inflammatory response syndrome) ([place]) 10/30/2019  . MGUS (monoclonal gammopathy of unknown significance) 10/22/2019  . Cellulitis 10/17/2019  . Acute kidney injury superimposed on CKD ([place]) 10/17/2019  . DNR (do not resuscitate) 10/17/2019  . Longstanding  persistent atrial fibrillation ([place])   . Cardiomyopathy ([place]) 07/14/2019  . Systolic CHF ([place]) 44/96/7591  . PAF (paroxysmal atrial fibrillation) ([employer]) 07/12/2019  . Peripheral neuropathy 08/12/2018  . B12 deficiency 08/12/2018  . Constipation   . Acute on chronic cholecystitis s/p lap cholecystectomy 12/06/2017 12/04/2017  . Nephrolithiasis 12/04/2017  . Abdominal pain 12/04/2017  . Spinal stenosis in cervical region 04/24/2017  . Spinal stenosis of lumbar region 04/24/2017  . Dizziness 04/24/2017  . Ureteral stone with hydronephrosis 01/20/2017  . Atrial fibrillation with RVR ([place]) 02/24/2016  . CKD (chronic kidney disease) stage 3, GFR 30-59 ml/min (HCC) 02/24/2016  . Paroxysmal atrial fibrillation ([place]) 02/24/2016  . 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 CHOLELITHIASIS 02/14/2009  . Hyperlipidemia 12/29/2008  . Essential hypertension 12/29/2008  . MYOCARDIAL INFARCTION, HX OF 12/29/2008  . CAD (coronary artery disease) 12/29/2008    Past Surgical History:  Procedure Laterality Date  . ABDOMINAL HYSTERECTOMY    . BREAST EXCISIONAL BIOPSY Left 11/02/2009   fibroadenoma (lumpectomy)  . BROW LIFT Right 04/07/2014   Procedure: REPAIR OF ENTROPION AND TRICHIASIS OF RIGHT LOWER EYE LID ;  Surgeon: Theodoro Kos, DO;  Location: [facility];  Service: Plastics;  Laterality: Right;  .  CARDIOVERSION N/A 07/18/2017   Procedure: CARDIOVERSION;  Surgeon: Skeet Latch, MD;  Location: [facility];  Service: Cardiovascular;  Laterality: N/A;  . CARDIOVERSION N/A 07/13/2019   Procedure: CARDIOVERSION;  Surgeon: Josue Hector, MD;  Location: [facility] ENDOSCOPY;  Service: Cardiovascular;  Laterality: N/A;  . CARDIOVERSION N/A 09/03/2019   Procedure: CARDIOVERSION;  Surgeon: Buford Dresser, MD;  Location: [facility] ENDOSCOPY;  Service: Cardiovascular;  Laterality: N/A;  . CATARACT EXTRACTION W/ INTRAOCULAR LENS  IMPLANT, BILATERAL    . CHOLECYSTECTOMY N/A 12/06/2017    Procedure: LAPAROSCOPIC CHOLECYSTECTOMY;  Surgeon: Jovita Kussmaul, MD;  Location: [facility];  Service: General;  Laterality: N/A;  . CORONARY ANGIOPLASTY  04/ 1994  dr Lia Foyer   PCI to LAD  . CORONARY ANGIOPLASTY    . CYSTOSCOPY W/ URETERAL STENT PLACEMENT Right 01/20/2017   Procedure: CYSTOSCOPY WITH RIGHT RETROGRADE PYELOGRAM/RIGHT URETERAL STENT PLACEMENT;  Surgeon: Alexis Frock, MD;  Location: [facility];  Service: Urology;  Laterality: Right;  . CYSTOSCOPY WITH RETROGRADE PYELOGRAM, URETEROSCOPY AND STENT PLACEMENT Right 02/15/2017   Procedure: CYSTOSCOPY WITH RETROGRADE PYELOGRAM, URETEROSCOPY, STONE BASKETRY AND STENT REPLACEMENT;  Surgeon: Alexis Frock, MD;  Location: [facility];  Service: Urology;  Laterality: Right;  . CYSTOSCOPY/RETROGRADE/URETEROSCOPY/STONE EXTRACTION WITH BASKET  yrs ago  . DILATION AND CURETTAGE OF UTERUS    . HOLMIUM LASER APPLICATION Right 1/61/0960   Procedure: HOLMIUM LASER APPLICATION;  Surgeon: Alexis Frock, MD;  Location: [facility];  Service: Urology;  Laterality: Right;  . KNEE SURGERY     right  . NEUROPLASTY / TRANSPOSITION ULNAR NERVE AT ELBOW Left 02-23-2000    [facility]   and Left Carpal Tunnel Release  . r eyelid cancer    . TONSILLECTOMY    . TRANSTHORACIC ECHOCARDIOGRAM  02/26/2016   moderate focal basal and mild concentric LVH,  ef 55-60%,  akinesis of the basal-midanteroseptal, inferoseptal and apicalinferior myocardium, study not sufficient to evaluation diastolic funciton due to atrial fib./  trivial PR/  mild TR  . UMBILICAL HERNIA REPAIR       OB History   No obstetric history on file.     Family History  Problem Relation Age of Onset  . Coronary artery disease Other        positive family hx of  . Cancer Other        family hx of  . Brain cancer Son   . Liver cancer Daughter   . Neuropathy Neg Hx     Social History   Tobacco Use  . Smoking status: Former Smoker    Years: 5.00    Types:  Cigarettes  . Smokeless tobacco: Never Used  . Tobacco comment: "didn't have the habit that much", just smoked "with the girls"  Vaping Use  . Vaping Use: Never used  Substance Use Topics  . Alcohol use: No    Alcohol/week: 0.0 standard drinks  . Drug use: No    Home Medications Prior to Admission medications   Medication Sig Start Date End Date Taking? Authorizing Provider  amiodarone (PACERONE) 200 MG tablet Take 200 mg by mouth 2 (two) times daily. 11/02/19  Yes [provider]  ELIQUIS 5 MG TABS tablet Take 5 mg by mouth 2 (two) times daily. 11/22/19  Yes [provider]  fluocinonide (LIDEX) 0.05 % external solution Apply 1 application topically as needed (itching).  06/15/19  Yes [provider]  furosemide (LASIX) 80 MG tablet Take 80 mg by mouth daily. 12/19/19  Yes [provider]  metoprolol tartrate (LOPRESSOR) 50 MG tablet Take 75mg  by mouth twice daily Patient taking differently: Take 50 mg by mouth 2 (two) times daily.  10/12/19  Yes Sherran Needs, NP  Multiple Vitamins-Minerals (ICAPS) CAPS Take 1 capsule by mouth 2 (two) times daily.    Yes [provider]  Phenylephrine HCl (NOSE DROPS NA) Place 1 drop into the nose daily as needed (alleriges).   Yes [provider]  triamcinolone (KENALOG) 0.025 % cream Apply 1 application topically as needed (itching).  06/15/19  Yes [provider]  apixaban (ELIQUIS) 2.5 MG TABS tablet Take 1 tablet (2.5 mg total) by mouth 2 (two) times daily. Patient not taking: Reported on 01/12/2020 11/03/19 01/11/29  Antonieta Pert, MD  DHA-EPA-Flaxseed Oil-Vitamin E (THERA TEARS NUTRITION PO) Take 1 drop by mouth daily as needed (watery eyes).    [provider]  Lactobacillus (PROBIOTIC ACIDOPHILUS) TABS Take 1 tablet by mouth in the morning and at bedtime. Patient not taking: Reported on 10/29/2019 10/20/19   Mercy Riding, MD    Allergies    Antihistamines, chlorpheniramine-type;  Penicillins; Contrast media [iodinated diagnostic agents]; Sulfonamide derivatives; Atorvastatin; and Pheniramine  Review of Systems   Review of Systems  Musculoskeletal: Positive for arthralgias.  All other systems reviewed and are negative.   Physical Exam Updated Vital Signs BP (!) 150/101 (BP Location: Right Arm)   Pulse 75   Temp 97.8 F (36.6 C) (Oral)   Resp 16   SpO2 97%   Physical Exam Vitals and nursing note reviewed.  Constitutional:      General: She is not in acute distress.    Appearance: Normal appearance. She is well-developed.  HENT:     Head: Normocephalic and atraumatic.     Right Ear: Hearing normal.     Left Ear: Hearing normal.     Nose: Nose normal.  Eyes:     Conjunctiva/sclera: Conjunctivae normal.     Pupils: Pupils are equal, round, and reactive to light.  Cardiovascular:     Rate and Rhythm: Rhythm irregularly irregular.     Heart sounds: S1 normal and S2 normal. No murmur heard.  No friction rub. No gallop.   Pulmonary:     Effort: Pulmonary effort is normal. No respiratory distress.     Breath sounds: Normal breath sounds.  Chest:     Chest wall: No tenderness.  Abdominal:     General: Bowel sounds are normal.     Palpations: Abdomen is soft.     Tenderness: There is no abdominal tenderness. There is no guarding or rebound. Negative signs include Murphy's sign and McBurney's sign.     Hernia: No hernia is present.  Musculoskeletal:     Cervical back: Normal range of motion and neck supple.     Right hip: Deformity and tenderness present. Decreased range of motion.  Skin:    General: Skin is warm and dry.     Findings: No rash.  Neurological:     Mental Status: She is alert and oriented to person, place, and time.     GCS: GCS eye subscore is 4. GCS verbal subscore is 5. GCS motor subscore is 6.     Cranial Nerves: No cranial nerve deficit.     Sensory: No sensory deficit.     Coordination: Coordination normal.  Psychiatric:         Speech: Speech normal.        Behavior: Behavior normal.  Thought Content: Thought content normal.     ED Results / Procedures / Treatments   Labs (all labs ordered are listed, but only abnormal results are displayed) Labs Reviewed  SARS CORONAVIRUS 2 BY RT PCR [facility] ORDER, [place] LAB)  CBC  BASIC METABOLIC PANEL  PROTIME-INR    EKG EKG Interpretation  Date/Time:  Tuesday January 12 2020 02:44:32 EDT Ventricular Rate:  92 PR Interval:    QRS Duration: 132 QT Interval:  412 QTC Calculation: 510 R Axis:   -102 Text Interpretation: Atrial fibrillation RBBB and LAFB Probable lateral infarct, age indeterminate Confirmed by Orpah Greek [phone]) on 01/12/2020 2:50:48 AM   Radiology CT Head Wo Contrast  Result Date: 01/12/2020 CLINICAL DATA:  86 year old female status post fall. On blood thinners. EXAM: CT HEAD WITHOUT CONTRAST TECHNIQUE: Contiguous axial images were obtained from the base of the skull through the vertex without intravenous contrast. COMPARISON:  Brain MRI 01/26/2017. FINDINGS: Brain: Stable cerebral volume since 2018. No midline shift, mass effect, or evidence of intracranial mass lesion. No ventriculomegaly. No acute intracranial hemorrhage identified. No cortically based acute infarct identified. Stable mild for age white matter changes. Vascular: Calcified atherosclerosis at the skull base. Skull: No skull fracture identified. Sinuses/Orbits: Chronic right maxillary sinusitis with periosteal thickening and partially calcified retained secretions. Chronic left maxillary mucous retention cysts. Other Visualized paranasal sinuses and mastoids are clear. Other: No acute orbit or scalp soft tissue injury identified. IMPRESSION: 1. No acute intracranial abnormality or acute traumatic injury identified. 2. Chronic right maxillary sinusitis. Electronically Signed   By: Genevie Ann M.D.   On: 01/12/2020 02:16   CT Cervical Spine Wo  Contrast  Result Date: 01/12/2020 CLINICAL DATA:  86 year old female status post fall. On blood thinners. EXAM: CT CERVICAL SPINE WITHOUT CONTRAST TECHNIQUE: Multidetector CT imaging of the cervical spine was performed without intravenous contrast. Multiplanar CT image reconstructions were also generated. COMPARISON:  Head CT today reported separately. FINDINGS: Alignment: Mildly exaggerated cervical lordosis. Cervicothoracic junction alignment is within normal limits. Bilateral posterior element alignment is within normal limits. Skull base and vertebrae: Visualized skull base is intact. No atlanto-occipital dissociation. No acute osseous abnormality identified. Soft tissues and spinal canal: No prevertebral fluid or swelling. No visible canal hematoma. Negative visible noncontrast neck soft tissues aside from calcified carotid atherosclerosis. Disc levels: C2-C3 ankylosis. Mild for age superimposed cervical spine degeneration. Upper chest: Visible upper thoracic levels appear grossly intact. Mild apical lung scarring. IMPRESSION: 1. No acute traumatic injury identified in the cervical spine. 2. C2-C3 ankylosis with mild for age cervical spine degeneration elsewhere. Electronically Signed   By: Genevie Ann M.D.   On: 01/12/2020 02:19   DG Hip Unilat W or Wo Pelvis 2-3 Views Left  Result Date: 01/12/2020 CLINICAL DATA:  Fall and pain EXAM: DG HIP (WITH OR WITHOUT PELVIS) 2-3V LEFT COMPARISON:  None. FINDINGS: There is a nondisplaced right intratrochanteric femoral fracture. The femoral head is still well seated within the acetabulum. No other definite fracture is identified. Overlying soft tissue swelling is seen. Calcified probable uterine fibroid in the deep pelvis. IMPRESSION: Nondisplaced right intratrochanteric fracture Electronically Signed   By: Prudencio Pair M.D.   On: 01/12/2020 02:11    Procedures Procedures (including critical care time)  Medications Ordered in ED Medications - No data to  display  ED Course  I have reviewed the triage vital signs and the nursing notes.  Pertinent labs & imaging results that were available during my  care of the patient were reviewed by me and considered in my medical decision making (see chart for details).    MDM Rules/Calculators/A&P                          Patient presents to the emergency department for evaluation after a fall.  Patient had a ground-level fall caused by loss of balance.  Patient complaining of isolated right hip pain.  X-ray confirms right intertrochanteric hip fracture.  She does take Eliquis due to a history of chronic atrial fibrillation.  She is currently rate controlled.  She thinks her last dose of Eliquis was this morning.  CHA2DS2/VAS Stroke Risk Points  Current as of 5 minutes ago     6 >= 2 Points: High Risk  1 - 1.99 Points: Medium Risk  0 Points: Low Risk    No Change      Details    This score determines the patient's risk of having a stroke if the  patient has atrial fibrillation.       Points Metrics  1 Has Congestive Heart Failure:  Yes    Current as of 5 minutes ago  1 Has Vascular Disease:  Yes    Current as of 5 minutes ago  1 Has Hypertension:  Yes    Current as of 5 minutes ago  2 Age:  65    Current as of 5 minutes ago  0 Has Diabetes:  No    Current as of 5 minutes ago  0 Had Stroke:  No  Had TIA:  No  Had thromboembolism:  No    Current as of 5 minutes ago  1 Female:  Yes    Current as of 5 minutes ago      Patient discussed with Dr. Alvan Dame, will consult on patient.  Patient to be admitted by hospitalist service at [facility].   Final Clinical Impression(s) / ED Diagnoses Final diagnoses:  Closed nondisplaced intertrochanteric fracture of right femur, initial encounter [facility])    Rx / [place] Orders ED Discharge Orders    None       Wanell Lorenzi, Gwenyth Allegra, MD 01/12/20 [phone]

## 2020-01-12 NOTE — ED Triage Notes (Signed)
Per EMS, pt from home, leaned over and lost her balance and fell.  She is on thinners but did not hit her head.  Has right hip pain.  Only pain w/ movement.    Pt reports she cannot move her leg.    142/98 78pulse 18 RR 96% RA

## 2020-01-13 ENCOUNTER — Inpatient Hospital Stay (HOSPITAL_COMMUNITY): Payer: Medicare Other | Admitting: Registered Nurse

## 2020-01-13 ENCOUNTER — Inpatient Hospital Stay (HOSPITAL_COMMUNITY): Payer: Medicare Other

## 2020-01-13 ENCOUNTER — Encounter (HOSPITAL_COMMUNITY)
Admission: EM | Disposition: A | Discharge status: Skilled Nursing Facility | Payer: Self-pay | Source: Home / Self Care | Attending: Internal Medicine

## 2020-01-13 ENCOUNTER — Encounter (HOSPITAL_COMMUNITY): Payer: Self-pay | Admitting: Internal Medicine

## 2020-01-13 DIAGNOSIS — S72001A Fracture of unspecified part of neck of right femur, initial encounter for closed fracture: Secondary | ICD-10-CM

## 2020-01-13 HISTORY — PX: INTRAMEDULLARY (IM) NAIL INTERTROCHANTERIC: SHX5875

## 2020-01-13 LAB — SURGICAL PCR SCREEN
MRSA, PCR: NEGATIVE
Staphylococcus aureus: NEGATIVE

## 2020-01-13 LAB — TYPE AND SCREEN
ABO/RH(D): O POS
Antibody Screen: NEGATIVE

## 2020-01-13 LAB — ABO/RH: ABO/RH(D): O POS

## 2020-01-13 SURGERY — FIXATION, FRACTURE, INTERTROCHANTERIC, WITH INTRAMEDULLARY ROD
Anesthesia: General | Laterality: Right

## 2020-01-13 MED ORDER — HYDROCODONE-ACETAMINOPHEN 5-325 MG PO TABS
1.0000 | ORAL_TABLET | ORAL | Status: DC | PRN
  Administered 2020-01-13 – 2020-01-17 (×4): 1 via ORAL
  Filled 2020-01-13 (×4): qty 1

## 2020-01-13 MED ORDER — STERILE WATER FOR IRRIGATION IR SOLN
Status: DC | PRN
  Administered 2020-01-13: 2000 mL

## 2020-01-13 MED ORDER — ONDANSETRON HCL 4 MG/2ML IJ SOLN: 4.0000 mg | Freq: Four times a day (QID) | INTRAMUSCULAR | Status: DC | PRN

## 2020-01-13 MED ORDER — ONDANSETRON HCL 4 MG/2ML IJ SOLN: 4.0000 mg | Freq: Once | INTRAMUSCULAR | Status: DC | PRN

## 2020-01-13 MED ORDER — POVIDONE-IODINE 10 % EX SWAB
2.0000 "application " | Freq: Once | CUTANEOUS | Status: AC
  Administered 2020-01-13: 2 via TOPICAL

## 2020-01-13 MED ORDER — FENTANYL CITRATE (PF) 100 MCG/2ML IJ SOLN: 25.0000 ug | INTRAMUSCULAR | Status: DC | PRN

## 2020-01-13 MED ORDER — ONDANSETRON HCL 4 MG/2ML IJ SOLN
INTRAMUSCULAR | Status: AC
  Filled 2020-01-13: qty 2

## 2020-01-13 MED ORDER — METOCLOPRAMIDE HCL 5 MG/ML IJ SOLN: 5.0000 mg | Freq: Three times a day (TID) | INTRAMUSCULAR | Status: DC | PRN

## 2020-01-13 MED ORDER — PROPOFOL 10 MG/ML IV BOLUS
INTRAVENOUS | Status: AC
  Filled 2020-01-13: qty 20

## 2020-01-13 MED ORDER — DOCUSATE SODIUM 100 MG PO CAPS: 100.0000 mg | ORAL_CAPSULE | Freq: Two times a day (BID) | ORAL | Status: DC

## 2020-01-13 MED ORDER — FENTANYL CITRATE (PF) 100 MCG/2ML IJ SOLN
INTRAMUSCULAR | Status: DC | PRN
  Administered 2020-01-13: 50 ug via INTRAVENOUS

## 2020-01-13 MED ORDER — ONDANSETRON HCL 4 MG PO TABS: 4.0000 mg | ORAL_TABLET | Freq: Four times a day (QID) | ORAL | Status: DC | PRN

## 2020-01-13 MED ORDER — ROCURONIUM BROMIDE 10 MG/ML (PF) SYRINGE
PREFILLED_SYRINGE | INTRAVENOUS | Status: DC | PRN
  Administered 2020-01-13: 40 mg via INTRAVENOUS

## 2020-01-13 MED ORDER — ENSURE ENLIVE PO LIQD
237.0000 mL | ORAL | Status: DC
  Administered 2020-01-13 – 2020-01-16 (×4): 237 mL via ORAL

## 2020-01-13 MED ORDER — CLINDAMYCIN PHOSPHATE 600 MG/50ML IV SOLN
600.0000 mg | Freq: Four times a day (QID) | INTRAVENOUS | Status: AC
  Administered 2020-01-13 – 2020-01-14 (×2): 600 mg via INTRAVENOUS
  Filled 2020-01-13 (×2): qty 50

## 2020-01-13 MED ORDER — PHENYLEPHRINE HCL (PRESSORS) 10 MG/ML IV SOLN
INTRAVENOUS | Status: DC | PRN
  Administered 2020-01-13: 40 ug via INTRAVENOUS
  Administered 2020-01-13: 80 ug via INTRAVENOUS
  Administered 2020-01-13: 40 ug via INTRAVENOUS
  Administered 2020-01-13: 80 ug via INTRAVENOUS

## 2020-01-13 MED ORDER — METOCLOPRAMIDE HCL 5 MG PO TABS: 5.0000 mg | ORAL_TABLET | Freq: Three times a day (TID) | ORAL | Status: DC | PRN

## 2020-01-13 MED ORDER — POLYETHYLENE GLYCOL 3350 17 G PO PACK
17.0000 g | PACK | Freq: Every day | ORAL | Status: DC
  Administered 2020-01-13 – 2020-01-16 (×4): 17 g via ORAL
  Filled 2020-01-13 (×4): qty 1

## 2020-01-13 MED ORDER — DOCUSATE SODIUM 100 MG PO CAPS
200.0000 mg | ORAL_CAPSULE | Freq: Two times a day (BID) | ORAL | Status: DC
  Administered 2020-01-13 – 2020-01-17 (×8): 200 mg via ORAL
  Filled 2020-01-13 (×9): qty 2

## 2020-01-13 MED ORDER — ALBUMIN HUMAN 5 % IV SOLN
12.5000 g | Freq: Once | INTRAVENOUS | Status: AC
  Administered 2020-01-13: 12.5 g via INTRAVENOUS
  Filled 2020-01-13: qty 250

## 2020-01-13 MED ORDER — LIDOCAINE 2% (20 MG/ML) 5 ML SYRINGE
INTRAMUSCULAR | Status: DC | PRN
  Administered 2020-01-13: 75 mg via INTRAVENOUS

## 2020-01-13 MED ORDER — DEXAMETHASONE SODIUM PHOSPHATE 10 MG/ML IJ SOLN
INTRAMUSCULAR | Status: AC
  Filled 2020-01-13: qty 1

## 2020-01-13 MED ORDER — TRANEXAMIC ACID-NACL 1000-0.7 MG/100ML-% IV SOLN
1000.0000 mg | INTRAVENOUS | Status: AC
  Administered 2020-01-13: 1000 mg via INTRAVENOUS
  Filled 2020-01-13: qty 100

## 2020-01-13 MED ORDER — ONDANSETRON HCL 4 MG/2ML IJ SOLN
INTRAMUSCULAR | Status: DC | PRN
  Administered 2020-01-13: 4 mg via INTRAVENOUS

## 2020-01-13 MED ORDER — PROPOFOL 10 MG/ML IV BOLUS
INTRAVENOUS | Status: DC | PRN
  Administered 2020-01-13 (×2): 40 mg via INTRAVENOUS

## 2020-01-13 MED ORDER — ROCURONIUM BROMIDE 10 MG/ML (PF) SYRINGE
PREFILLED_SYRINGE | INTRAVENOUS | Status: AC
  Filled 2020-01-13: qty 10

## 2020-01-13 MED ORDER — PHENYLEPHRINE HCL-NACL 20-0.9 MG/250ML-% IV SOLN
INTRAVENOUS | Status: DC | PRN
  Administered 2020-01-13: 25 ug/min via INTRAVENOUS

## 2020-01-13 MED ORDER — PHENYLEPHRINE HCL (PRESSORS) 10 MG/ML IV SOLN
INTRAVENOUS | Status: AC
  Filled 2020-01-13: qty 2

## 2020-01-13 MED ORDER — LACTATED RINGERS IV SOLN: INTRAVENOUS | Status: DC

## 2020-01-13 MED ORDER — APIXABAN 2.5 MG PO TABS: 2.5000 mg | ORAL_TABLET | Freq: Two times a day (BID) | ORAL | Status: DC

## 2020-01-13 MED ORDER — HYDROCODONE-ACETAMINOPHEN 7.5-325 MG PO TABS: 1.0000 | ORAL_TABLET | ORAL | Status: DC | PRN

## 2020-01-13 MED ORDER — FENTANYL CITRATE (PF) 100 MCG/2ML IJ SOLN
INTRAMUSCULAR | Status: AC
  Filled 2020-01-13: qty 2

## 2020-01-13 MED ORDER — ACETAMINOPHEN 325 MG PO TABS: 325.0000 mg | ORAL_TABLET | Freq: Four times a day (QID) | ORAL | Status: DC | PRN

## 2020-01-13 MED ORDER — SUGAMMADEX SODIUM 200 MG/2ML IV SOLN
INTRAVENOUS | Status: DC | PRN
  Administered 2020-01-13: 150 mg via INTRAVENOUS

## 2020-01-13 MED ORDER — ISOPROPYL ALCOHOL 70 % SOLN
Status: DC | PRN
  Administered 2020-01-13: 1 via TOPICAL

## 2020-01-13 MED ORDER — DEXAMETHASONE SODIUM PHOSPHATE 10 MG/ML IJ SOLN
INTRAMUSCULAR | Status: DC | PRN
  Administered 2020-01-13: 8 mg via INTRAVENOUS

## 2020-01-13 MED ORDER — LIDOCAINE 2% (20 MG/ML) 5 ML SYRINGE
INTRAMUSCULAR | Status: AC
  Filled 2020-01-13: qty 5

## 2020-01-13 MED ORDER — CHLORHEXIDINE GLUCONATE 4 % EX LIQD
60.0000 mL | Freq: Once | CUTANEOUS | Status: AC
  Administered 2020-01-13: 4 via TOPICAL
  Filled 2020-01-13: qty 15

## 2020-01-13 MED ORDER — MENTHOL 3 MG MT LOZG: 1.0000 | LOZENGE | OROMUCOSAL | Status: DC | PRN

## 2020-01-13 MED ORDER — CHLORHEXIDINE GLUCONATE CLOTH 2 % EX PADS
6.0000 | MEDICATED_PAD | Freq: Every day | CUTANEOUS | Status: DC
  Administered 2020-01-14 – 2020-01-17 (×3): 6 via TOPICAL

## 2020-01-13 MED ORDER — PHENOL 1.4 % MT LIQD: 1.0000 | OROMUCOSAL | Status: DC | PRN

## 2020-01-13 MED ORDER — VANCOMYCIN HCL IN DEXTROSE 1-5 GM/200ML-% IV SOLN
1000.0000 mg | INTRAVENOUS | Status: AC
  Administered 2020-01-13: 1000 mg via INTRAVENOUS
  Filled 2020-01-13 (×2): qty 200

## 2020-01-13 MED ORDER — 0.9 % SODIUM CHLORIDE (POUR BTL) OPTIME
TOPICAL | Status: DC | PRN
  Administered 2020-01-13: 1000 mL

## 2020-01-13 SURGICAL SUPPLY — 47 items
ADH SKN CLS APL DERMABOND .7 (GAUZE/BANDAGES/DRESSINGS) ×1
APL PRP STRL LF DISP 70% ISPRP (MISCELLANEOUS) ×1
BAG SPEC THK2 15X12 ZIP CLS (MISCELLANEOUS)
BAG ZIPLOCK 12X15 (MISCELLANEOUS) IMPLANT
BIT DRILL CANN LG 4.3MM (BIT) IMPLANT
CHLORAPREP W/TINT 26 (MISCELLANEOUS) ×2 IMPLANT
COVER PERINEAL POST (MISCELLANEOUS) ×2 IMPLANT
COVER SURGICAL LIGHT HANDLE (MISCELLANEOUS) ×2 IMPLANT
COVER WAND RF STERILE (DRAPES) ×1 IMPLANT
DERMABOND ADVANCED (GAUZE/BANDAGES/DRESSINGS) ×1
DERMABOND ADVANCED .7 DNX12 (GAUZE/BANDAGES/DRESSINGS) ×2 IMPLANT
DRAPE C-ARM 42X120 X-RAY (DRAPES) ×2 IMPLANT
DRAPE C-ARMOR (DRAPES) ×2 IMPLANT
DRAPE IMP U-DRAPE 54X76 (DRAPES) ×4 IMPLANT
DRAPE SHEET LG 3/4 BI-LAMINATE (DRAPES) ×4 IMPLANT
DRAPE STERI IOBAN 125X83 (DRAPES) ×2 IMPLANT
DRAPE U-SHAPE 47X51 STRL (DRAPES) ×4 IMPLANT
DRESSING AQUACEL AG SP 3.5X4 (GAUZE/BANDAGES/DRESSINGS) IMPLANT
DRESSING AQUACEL AG SP 3.5X6 (GAUZE/BANDAGES/DRESSINGS) IMPLANT
DRILL BIT CANN LG 4.3MM (BIT) ×2
DRSG AQUACEL AG SP 3.5X4 (GAUZE/BANDAGES/DRESSINGS) ×2
DRSG AQUACEL AG SP 3.5X6 (GAUZE/BANDAGES/DRESSINGS) ×2
DRSG MEPILEX BORDER 4X4 (GAUZE/BANDAGES/DRESSINGS) ×4 IMPLANT
DRSG MEPILEX BORDER 4X8 (GAUZE/BANDAGES/DRESSINGS) IMPLANT
ELECT BLADE TIP CTD 4 INCH (ELECTRODE) IMPLANT
FACESHIELD WRAPAROUND (MASK) ×4 IMPLANT
FACESHIELD WRAPAROUND OR TEAM (MASK) ×2 IMPLANT
GAUZE SPONGE 4X4 12PLY STRL (GAUZE/BANDAGES/DRESSINGS) ×2 IMPLANT
GLOVE BIO SURGEON STRL SZ8.5 (GLOVE) ×4 IMPLANT
GLOVE BIOGEL PI IND STRL 8.5 (GLOVE) ×1 IMPLANT
GLOVE BIOGEL PI INDICATOR 8.5 (GLOVE) ×1
GOWN SPEC L3 XXLG W/TWL (GOWN DISPOSABLE) ×2 IMPLANT
GUIDEPIN 3.2X17.5 THRD DISP (PIN) ×2 IMPLANT
HFN 125 DEG 11MM X 180MM (Orthopedic Implant) ×1 IMPLANT
HIP FRAC NAIL LAG SCR 10.5X100 (Orthopedic Implant) ×2 IMPLANT
KIT BASIN OR (CUSTOM PROCEDURE TRAY) ×2 IMPLANT
KIT TURNOVER KIT A (KITS) IMPLANT
MANIFOLD NEPTUNE II (INSTRUMENTS) ×2 IMPLANT
MARKER SKIN DUAL TIP RULER LAB (MISCELLANEOUS) ×2 IMPLANT
PACK GENERAL/GYN (CUSTOM PROCEDURE TRAY) ×2 IMPLANT
PENCIL SMOKE EVACUATOR (MISCELLANEOUS) ×1 IMPLANT
SCREW BONE CORTICAL 5.0X36 (Screw) ×1 IMPLANT
SCREW CANN THRD AFF 10.5X100 (Orthopedic Implant) IMPLANT
SUT MNCRL AB 3-0 PS2 18 (SUTURE) ×2 IMPLANT
SUT VIC AB 1 CT1 36 (SUTURE) ×2 IMPLANT
TOWEL OR 17X26 10 PK STRL BLUE (TOWEL DISPOSABLE) ×2 IMPLANT
TRAY FOLEY MTR SLVR 14FR STAT (SET/KITS/TRAYS/PACK) ×1 IMPLANT

## 2020-01-13 NOTE — Anesthesia Preprocedure Evaluation (Addendum)
Anesthesia Evaluation  Patient identified by MRN, date of birth, ID band Patient awake    Reviewed: Allergy & Precautions, NPO status , Patient's Chart, lab work & pertinent test results  History of Anesthesia Complications (+) PONVNegative for: history of anesthetic complications  Airway Mallampati: II  TM Distance: >3 FB Neck ROM: Full    Dental  (+) Missing, Dental Advisory Given   Pulmonary neg pulmonary ROS, former smoker,    Pulmonary exam normal breath sounds clear to auscultation       Cardiovascular hypertension, + CAD, + Past MI (1994), + Cardiac Stents and +CHF  Normal cardiovascular exam+ dysrhythmias Atrial Fibrillation  Rhythm:Regular Rate:Normal  Echo 07/2019 1. Left ventricular ejection fraction, by estimation, is 25 to 30%. The left ventricle has severely decreased function. The left ventricle demonstrates regional wall motion abnormalities (see scoring diagram/findings for description). Diffuse hypokinesis with inferoseptal akinesis. Left ventricular diastolic parameters are indeterminate.  2. Right ventricular systolic function is normal. There is moderately elevated pulmonary artery systolic pressure. The estimated right ventricular systolic pressure is 58.5 mmHg.  3. Mild mitral valve regurgitation.  4. The aortic valve is tricuspid. Aortic sclerosis without significant stenosis. No regurgitation.  5. Dilated IVC with estimate RA pressure 15 mmHg.  6. The patient was in atrial fibrillation.    Neuro/Psych negative psych ROS   GI/Hepatic negative GI ROS, Neg liver ROS,   Endo/Other  negative endocrine ROS  Renal/GU Renal InsufficiencyRenal disease (CKDIII)  negative genitourinary   Musculoskeletal negative musculoskeletal ROS (+)   Abdominal   Peds  Hematology  (+) Blood dyscrasia, anemia ,   Anesthesia Other Findings  Echo 07/13/19: EF 25-30%, PASP 60, mild MR, AV sclerosis w/o stenosis   Reproductive/Obstetrics                            Anesthesia Physical  Anesthesia Plan  ASA: IV  Anesthesia Plan: General   Post-op Pain Management:    Induction: Intravenous  PONV Risk Score and Plan: 4 or greater and Treatment may vary due to age or medical condition, Ondansetron and Dexamethasone  Airway Management Planned: Oral ETT  Additional Equipment: Arterial line  Intra-op Plan:   Post-operative Plan: Possible Post-op intubation/ventilation  Informed Consent: I have reviewed the patients History and Physical, chart, labs and discussed the procedure including the risks, benefits and alternatives for the proposed anesthesia with the patient or authorized representative who has indicated his/her understanding and acceptance.   Patient has DNR.   Dental advisory given  Plan Discussed with: CRNA  Anesthesia Plan Comments:       Anesthesia Quick Evaluation

## 2020-01-13 NOTE — Anesthesia Procedure Notes (Signed)
Arterial Line Insertion Start/End8/04/2020 11:59 AM, 01/13/2020 12:03 PM Performed by: Victoriano Lain, CRNA  Patient location: Pre-op. Preanesthetic checklist: patient identified, IV checked, site marked, risks and benefits discussed, surgical consent, monitors and equipment checked, pre-op evaluation, timeout performed and anesthesia consent Lidocaine 1% used for infiltration radial was placed Catheter size: 20 G Hand hygiene performed  and maximum sterile barriers used  Allen's test indicative of satisfactory collateral circulation Attempts: 1 Procedure performed without using ultrasound guided technique. Following insertion, Biopatch and dressing applied. Post procedure assessment: normal  Patient tolerated the procedure well with no immediate complications. Additional procedure comments: Pt tolerated the procedure well. + Blood return. Line secured with occlusive dressing.[name]

## 2020-01-13 NOTE — Transfer of Care (Signed)
Immediate Anesthesia Transfer of Care Note  Patient: Shelly Obrien  Procedure(s) Performed: INTRAMEDULLARY (IM) NAIL INTERTROCHANTRIC (Right )  Patient Location: PACU  Anesthesia Type:General  Level of Consciousness: awake, alert , oriented and patient cooperative  Airway & Oxygen Therapy: Patient Spontanous Breathing and Patient connected to face mask oxygen  Post-op Assessment: Report given to RN, Post -op Vital signs reviewed and stable and Patient moving all extremities  Post vital signs: Reviewed and stable  Last Vitals:  Vitals Value Taken Time  BP 140/107 01/13/20 1418  Temp    Pulse 41 01/13/20 1420  Resp 14 01/13/20 1420  SpO2 100 % 01/13/20 1420  Vitals shown include unvalidated device data.  Last Pain:  Vitals:   01/13/20 1114  TempSrc:   PainSc: 3       Patients Stated Pain Goal: 2 (19/47/12 5271)  Complications: No complications documented.

## 2020-01-13 NOTE — Progress Notes (Signed)
PROGRESS NOTE    Shelly Obrien  GGY:[id number] DOB: June 27, 1922 DOA: 01/12/2020 PCP: [name], MD    Brief Narrative: Shelly Obrien is a 86 y.o. female with history of CAD status post stenting, A. fib, chronic kidney disease stage III, B12 deficiency presents to the ER the patient had a fall at home.  Patient states she was bending down to pick something when she lost balance and fell onto the floor hit her head did not lose consciousness.  Denies any chest pain or shortness of breath.  She took about 2 hours to gain his strength and to reach the phone and called her neighbor who in turn called the EMS and was brought to the ER.  ED Course: X-rays in the ER showed right hip fracture.  Dr. Alvan Dame orthopedic surgeon was notified.  Patient's last dose of Eliquis was yesterday morning.  EKG shows A. fib rate controlled labs are creatinine 1.9 Covid test negative CT head and C-spine unremarkable.  Assessment & Plan:   Active Problems:   Essential hypertension   CAD (coronary artery disease)   CKD (chronic kidney disease) stage 3, GFR 30-59 ml/min (HCC)   PAF (paroxysmal atrial fibrillation) (HCC)   DNR (do not resuscitate)   MGUS (monoclonal gammopathy of unknown significance)   Hip fracture (HCC)   Hip fracture, right ([place])   Fall   #1 right hip fracture status post mechanical fall status post intramedullary fixation 01/13/2020 Restart Eliquis when okay with Ortho Start stool softeners Pain control  #2 chronic atrial fibrillation on On amiodarone and metoprolol tartrate 50 mg twice a day Eliquis on hold  #3 acute on chronic CKD stage IIIb follow-up renal functions in a.m.  #4 history of systolic congestive heart failure echocardiogram in February 2021 with ejection fraction 25 to 30%.  She is on Lasix 80 mg daily diet with patient.  Will need to restart this at some point.  #5 history of CAD and stent  #6 history of B12 deficiency   Nutrition Problem: Increased nutrient  needs Etiology: post-op healing     Signs/Symptoms: estimated needs    Interventions: Ensure Enlive (each supplement provides 350kcal and 20 grams of protein)  Estimated body mass index is 27.88 kg/m as calculated from the following:   Height as of this encounter: 5\' 5"  (1.651 m).   Weight as of this encounter: 76 kg.  DVT prophylaxis: SCD Code Status: DNR Family Communication: None at bedside  disposition Plan:  Status is: Inpatient  Dispo: The patient is from:snf              Anticipated d/c is to:snf              Anticipated d/c date is unknown              Patient currently is not medically stable to d/c.    Consultants: Ortho Procedures: Hip surgery 01/13/2020 Antimicrobials: None  Subjective: Patient resting in bed no complaints awaiting surgery Objective: Vitals:   01/13/20 1056 01/13/20 1418 01/13/20 1430 01/13/20 1445  BP: (!) 142/100 (!) 140/107 129/89 (!) 142/109  Pulse: 88 79 62 (!) 55  Resp: 17 13 (!) 8 19  Temp: 98.5 F (36.9 C) 98.1 F (36.7 C)    TempSrc: Oral     SpO2: 90% 100% 100% 91%  Weight:      Height:        Intake/Output Summary (Last 24 hours) at 01/13/2020 1508 Last data filed at 01/13/2020 1420  Gross per 24 hour  Intake 2053.38 ml  Output 825 ml  Net 1228.38 ml   Filed Weights   01/12/20 2242  Weight: 76 kg    Examination:  General exam: Appears calm and comfortable  Respiratory system: Clear to auscultation. Respiratory effort normal. Cardiovascular system: S1 & S2 heard, RRR. No JVD, murmurs, rubs, gallops or clicks. No pedal edema. Gastrointestinal system: Abdomen is nondistended, soft and nontender. No organomegaly or masses felt. Normal bowel sounds heard. Central nervous system: Alert and oriented. No focal neurological deficits. Extremities:trace edema Skin: No rashes, lesions or ulcers Psychiatry: Judgement and insight appear normal. Mood & affect appropriate.     Data Reviewed: I have personally reviewed  following labs and imaging studies  CBC: Recent Labs  Lab 01/12/20 0241  WBC 10.3  HGB 12.1  HCT 41.3  MCV 95.2  PLT 774   Basic Metabolic Panel: Recent Labs  Lab 01/12/20 0241  NA 144  K 4.1  CL 103  CO2 28  GLUCOSE 147*  BUN 21  CREATININE 1.91*  CALCIUM 9.6   GFR: Estimated Creatinine Clearance: 18 mL/min (A) (by C-G formula based on SCr of 1.91 mg/dL (H)). Liver Function Tests: No results for input(s): AST, ALT, ALKPHOS, BILITOT, PROT, ALBUMIN in the last 168 hours. No results for input(s): LIPASE, AMYLASE in the last 168 hours. No results for input(s): AMMONIA in the last 168 hours. Coagulation Profile: Recent Labs  Lab 01/12/20 0241  INR 1.3*   Cardiac Enzymes: No results for input(s): CKTOTAL, CKMB, CKMBINDEX, TROPONINI in the last 168 hours. BNP (last 3 results) No results for input(s): PROBNP in the last 8760 hours. HbA1C: No results for input(s): HGBA1C in the last 72 hours. CBG: No results for input(s): GLUCAP in the last 168 hours. Lipid Profile: No results for input(s): CHOL, HDL, LDLCALC, TRIG, CHOLHDL, LDLDIRECT in the last 72 hours. Thyroid Function Tests: No results for input(s): TSH, T4TOTAL, FREET4, T3FREE, THYROIDAB in the last 72 hours. Anemia Panel: No results for input(s): VITAMINB12, FOLATE, FERRITIN, TIBC, IRON, RETICCTPCT in the last 72 hours. Sepsis Labs: No results for input(s): PROCALCITON, LATICACIDVEN in the last 168 hours.  Recent Results (from the past 240 hour(s))  SARS Coronavirus 2 by RT PCR (hospital order, performed in [facility] hospital lab) Nasopharyngeal Nasopharyngeal Swab     Status: None   Collection Time: 01/12/20  2:28 AM   Specimen: Nasopharyngeal Swab  Result Value Ref Range Status   SARS Coronavirus 2 NEGATIVE NEGATIVE Final    Comment: (NOTE) SARS-CoV-2 target nucleic acids are NOT DETECTED.  The SARS-CoV-2 RNA is generally detectable in upper and lower respiratory specimens during the acute phase of  infection. The lowest concentration of SARS-CoV-2 viral copies this assay can detect is 250 copies / mL. A negative result does not preclude SARS-CoV-2 infection and should not be used as the sole basis for treatment or other patient management decisions.  A negative result may occur with improper specimen collection / handling, submission of specimen other than nasopharyngeal swab, presence of viral mutation(s) within the areas targeted by this assay, and inadequate number of viral copies (<250 copies / mL). A negative result must be combined with clinical observations, patient history, and epidemiological information.  Fact Sheet for Patients:   StrictlyIdeas.no  Fact Sheet for Healthcare Providers: BankingDealers.co.za  This test is not yet approved or  cleared by the [place] FDA and has been authorized for detection and/or diagnosis of SARS-CoV-2 by FDA under an Emergency  Use Authorization (EUA).  This EUA will remain in effect (meaning this test can be used) for the duration of the COVID-19 declaration under Section 564(b)(1) of the Act, 21 U.S.C. section 360bbb-3(b)(1), unless the authorization is terminated or revoked sooner.  Performed at [facility], [place] [address] [place], [place] [address]   Surgical PCR screen     Status: None   Collection Time: 01/13/20 12:56 AM   Specimen: Nasal Mucosa; Nasal Swab  Result Value Ref Range Status   MRSA, PCR NEGATIVE NEGATIVE Final   Staphylococcus aureus NEGATIVE NEGATIVE Final    Comment: (NOTE) The Xpert SA Assay (FDA approved for NASAL specimens in patients 78 years of age and older), is one component of a comprehensive surveillance program. It is not intended to diagnose infection nor to guide or monitor treatment. Performed at [facility], [place] [address] [place], [place] [address]          Radiology Studies: CT Head Wo Contrast  Result  Date: 01/12/2020 CLINICAL DATA:  86 year old female status post fall. On blood thinners. EXAM: CT HEAD WITHOUT CONTRAST TECHNIQUE: Contiguous axial images were obtained from the base of the skull through the vertex without intravenous contrast. COMPARISON:  Brain MRI 01/26/2017. FINDINGS: Brain: Stable cerebral volume since 2018. No midline shift, mass effect, or evidence of intracranial mass lesion. No ventriculomegaly. No acute intracranial hemorrhage identified. No cortically based acute infarct identified. Stable mild for age white matter changes. Vascular: Calcified atherosclerosis at the skull base. Skull: No skull fracture identified. Sinuses/Orbits: Chronic right maxillary sinusitis with periosteal thickening and partially calcified retained secretions. Chronic left maxillary mucous retention cysts. Other Visualized paranasal sinuses and mastoids are clear. Other: No acute orbit or scalp soft tissue injury identified. IMPRESSION: 1. No acute intracranial abnormality or acute traumatic injury identified. 2. Chronic right maxillary sinusitis. Electronically Signed   By: Genevie Ann M.D.   On: 01/12/2020 02:16   CT Cervical Spine Wo Contrast  Result Date: 01/12/2020 CLINICAL DATA:  86 year old female status post fall. On blood thinners. EXAM: CT CERVICAL SPINE WITHOUT CONTRAST TECHNIQUE: Multidetector CT imaging of the cervical spine was performed without intravenous contrast. Multiplanar CT image reconstructions were also generated. COMPARISON:  Head CT today reported separately. FINDINGS: Alignment: Mildly exaggerated cervical lordosis. Cervicothoracic junction alignment is within normal limits. Bilateral posterior element alignment is within normal limits. Skull base and vertebrae: Visualized skull base is intact. No atlanto-occipital dissociation. No acute osseous abnormality identified. Soft tissues and spinal canal: No prevertebral fluid or swelling. No visible canal hematoma. Negative visible noncontrast  neck soft tissues aside from calcified carotid atherosclerosis. Disc levels: C2-C3 ankylosis. Mild for age superimposed cervical spine degeneration. Upper chest: Visible upper thoracic levels appear grossly intact. Mild apical lung scarring. IMPRESSION: 1. No acute traumatic injury identified in the cervical spine. 2. C2-C3 ankylosis with mild for age cervical spine degeneration elsewhere. Electronically Signed   By: Genevie Ann M.D.   On: 01/12/2020 02:19   DG Chest Port 1 View  Result Date: 01/12/2020 CLINICAL DATA:  Fall EXAM: PORTABLE CHEST 1 VIEW COMPARISON:  Oct 29, 2019 FINDINGS: There is mild cardiomegaly. There is mildly increased interstitial markings seen at both lung bases as on the prior exam, likely chronic lung changes. Aortic knob calcifications are noted. Advanced left shoulder osteoarthritis is seen. IMPRESSION: No active disease. Electronically Signed   By: Prudencio Pair M.D.   On: 01/12/2020 02:54   DG Knee Right Port  Result Date: 01/12/2020  CLINICAL DATA:  Right hip fracture.  Pain. EXAM: PORTABLE RIGHT KNEE - 1-2 VIEW COMPARISON:  10/29/2019 FINDINGS: No acute fracture. No malalignment. Trace knee joint effusion. Moderate medial compartment osteoarthritis. Bones are demineralized. Soft tissues unremarkable. IMPRESSION: 1. No acute osseous abnormality, right knee. 2. Moderate medial compartment osteoarthritis. Electronically Signed   By: Davina Poke D.O.   On: 01/12/2020 12:45   DG C-Arm 1-60 Min-No Report  Result Date: 01/13/2020 Fluoroscopy was utilized by the requesting physician.  No radiographic interpretation.   DG Hip Unilat W or Wo Pelvis 2-3 Views Left  Result Date: 01/12/2020 CLINICAL DATA:  Fall and pain EXAM: DG HIP (WITH OR WITHOUT PELVIS) 2-3V LEFT COMPARISON:  None. FINDINGS: There is a nondisplaced right intratrochanteric femoral fracture. The femoral head is still well seated within the acetabulum. No other definite fracture is identified. Overlying soft tissue  swelling is seen. Calcified probable uterine fibroid in the deep pelvis. IMPRESSION: Nondisplaced right intratrochanteric fracture Electronically Signed   By: Prudencio Pair M.D.   On: 01/12/2020 02:11        Scheduled Meds: . [MAR Hold] amiodarone  200 mg Oral BID  . [MAR Hold] feeding supplement (ENSURE ENLIVE)  237 mL Oral Q24H  . [MAR Hold] metoprolol tartrate  50 mg Oral BID   Continuous Infusions: . lactated ringers 10 mL/hr at 01/13/20 1217     LOS: 1 day     Georgette Shell, MD 01/13/2020, 3:08 PM

## 2020-01-13 NOTE — Op Note (Signed)
OPERATIVE REPORT  SURGEON: Rod Can, MD   ASSISTANT: Cherlynn June, PA-C.  PREOPERATIVE DIAGNOSIS: Right intertrochanteric femur fracture.   POSTOPERATIVE DIAGNOSIS: Right intertrochanteric femur fracture.   PROCEDURE: Intramedullary fixation, Right femur.   IMPLANTS: Biomet Affixus Hip Fracture Nail, 11 by 180 mm, 125 degrees. 10.5 x 100 mm Hip Fracture Nail Lag Screw. 5 x 36 mm distal interlocking screw 1.  ANESTHESIA:  General  ESTIMATED BLOOD LOSS:-50 mL    ANTIBIOTICS: 2 g Ancef.  DRAINS: None.  COMPLICATIONS: None.   CONDITION: PACU - hemodynamically stable.   BRIEF CLINICAL NOTE: Shelly Obrien is a 86 y.o. female who presented with an intertrochanteric femur fracture. The patient was admitted to the hospitalist service and underwent perioperative risk stratification and medical optimization. The risks, benefits, and alternatives to the procedure were explained, and the patient elected to proceed.  PROCEDURE IN DETAIL: Surgical site was marked by myself. The patient was taken to the operating room and anesthesia was induced on the bed. The patient was then transferred to the [facility] table and the nonoperative lower extremity was scissored underneath the operative side. The fracture was reduced with traction, internal rotation, and adduction. The hip was prepped and draped in the normal sterile surgical fashion. Timeout was called verifying side and site of surgery. Preop antibiotics were given with 60 minutes of beginning the procedure.  Fluoroscopy was used to define the patient's anatomy. A 4 cm incision was made just proximal to the tip of the greater trochanter. The awl was used to obtain the standard starting point for a trochanteric entry nail under fluoroscopic control. The guidepin was placed. The entry reamer was used to open the proximal femur.  On the back table, the nail was assembled onto the jig. The nail was placed into the femur without any difficulty.  Through a separate stab incision, the cannula was placed down to the bone in preparation for the cephalomedullary device. A guidepin was placed into the femoral head using AP and lateral fluoroscopy views. The pin was measured, and then reaming was performed to the appropriate depth. The lag screw was inserted to the appropriate depth. The fracture was compressed through the jig. The setscrew was tightened and then loosened one quarter turn. A separate stab incision was created, and the distal interlocking screw was placed using standard AO technique. The jig was removed. Final AP and lateral fluoroscopy views were obtained to confirm fracture reduction and hardware placement. Tip apex distance was appropriate. There was no chondral penetration.  The wounds were copiously irrigated with saline. The wound was closed in layers with #1 Vicryl for the fascia, 2-0 Monocryl for the deep dermal layer, and 3-0 Monocryl subcuticular stitch. Glue was applied to the skin. Once the glue was fully hardened, sterile dressing was applied. The patient was then awakened from anesthesia and taken to the PACU in stable condition. Sponge needle and instrument counts were correct at the end of the case 2. There were no known complications.  We will readmit the patient to the hospitalist. Weightbearing status will be weightbearing as tolerated with a walker. We will start apixaban 2.5 mg PO BID tomorrow am for DVT prophylaxis, and after 48-72 hours, the full home dose can be resumed. The patient will work with physical therapy and undergo disposition planning.  Please note that a surgical assistant was a medical necessity for this procedure to perform it in a safe and expeditious manner. Assistant was necessary to provide appropriate retraction of vital neurovascular  structures, to prevent femoral fracture, and to allow for anatomic placement of the prosthesis.

## 2020-01-13 NOTE — Progress Notes (Signed)
Initial Nutrition Assessment  DOCUMENTATION CODES:   Not applicable  INTERVENTION:  - diet re-advancement as medically feasible. - will order Ensure Enlive once/day, each supplement provides 350 kcal and 20 grams of protein.  NUTRITION DIAGNOSIS:   Increased nutrient needs related to post-op healing as evidenced by estimated needs.  GOAL:   Patient will meet greater than or equal to 90% of their needs  MONITOR:   Diet advancement, PO intake, Supplement acceptance, Labs, Weight trends  REASON FOR ASSESSMENT:   Consult Hip fracture protocol  ASSESSMENT:   86 y.o. female with history of CAD s/p stenting, A. fib, stage 3 CKD, and B12 deficiency . She presented to the ED after a fall at home during which she hit her head but did not lose consciousness. X-ray in the ED showed R hip fx; CT head and C-spine were unremarkable.  Diet advanced from NPO to Heart Healthy yesterday at 1212 and then made NPO again at midnight. No intakes documented from yesterday afternoon/evening.   Patient denies any changes in appetite, intakes, or desire to eat PTA. No chewing or swallowing issues at baseline. She denies any recent weight changes.   Patient weighed 167 lb yesterday and this is highest weight in at least 6 months.   Per notes: - R intratrochanteric fx with plan for ORIF vs hemi-arthroplasty today (8/11)   Labs reviewed; creatinine: 1.91 mg/dl, GFR: 22 ml/min. Medications reviewed.    NUTRITION - FOCUSED PHYSICAL EXAM:  completed; no fat or muscle depletions, mild edema to RLE.  Diet Order:   Diet Order            Diet NPO time specified  Diet effective midnight                 EDUCATION NEEDS:   No education needs have been identified at this time  Skin:  Skin Assessment: Reviewed RN Assessment  Last BM:  PTA/unknown  Height:   Ht Readings from Last 1 Encounters:  01/12/20 5\' 5"  (1.651 m)    Weight:   Wt Readings from Last 1 Encounters:  01/12/20 76 kg     Estimated Nutritional Needs:  Kcal:  3154-0086 kcal Protein:  80-90 grams Fluid:  >/= 1.8 L/day     Jarome Matin, MS, RD, LDN, CNSC Inpatient Clinical Dietitian RD pager # available in [facility]  After hours/weekend pager # available in [facility]

## 2020-01-13 NOTE — Interval H&P Note (Signed)
History and Physical Interval Note:  01/13/2020 11:01 AM  Shelly Obrien Age  has presented today for surgery, with the diagnosis of FEMUR FRACTURE.  The various methods of treatment have been discussed with the patient and family. After consideration of risks, benefits and other options for treatment, the patient has consented to  Procedure(s): INTRAMEDULLARY (IM) NAIL INTERTROCHANTRIC (Right) as a surgical intervention.  The patient's history has been reviewed, patient examined, no change in status, stable for surgery.  I have reviewed the patient's chart and labs.  Questions were answered to the patient's satisfaction.    The risks, benefits, and alternatives were discussed with the patient. There are risks associated with the surgery including, but not limited to, problems with anesthesia (death), infection, differences in leg length/angulation/rotation, fracture of bones, loosening or failure of implants, malunion, nonunion, hematoma (blood accumulation) which may require surgical drainage, blood clots, pulmonary embolism, nerve injury (foot drop), and blood vessel injury. The patient understands these risks and elects to proceed.    Shelly Obrien Shelly Obrien

## 2020-01-13 NOTE — Discharge Instructions (Signed)
 Dr. Deziya Amero Adult Hip & Knee Specialist [facility] [address] [place], [place] [address] [phone]   POSTOPERATIVE DIRECTIONS    Hip Rehabilitation, Guidelines Following Surgery   WEIGHT BEARING Weight bearing as tolerated with assist device (walker, cane, etc) as directed, use it as long as suggested by your surgeon or therapist, typically at least 4-6 weeks.   HOME CARE INSTRUCTIONS  Remove items at home which could result in a fall. This includes throw rugs or furniture in walking pathways.  Continue medications as instructed at time of discharge.  You may have some home medications which will be placed on hold until you complete the course of blood thinner medication.  4 days after discharge, you may start showering. No tub baths or soaking your incisions. Do not put on socks or shoes without following the instructions of your caregivers.   Sit on chairs with arms. Use the chair arms to help push yourself up when arising.  Arrange for the use of a toilet seat elevator so you are not sitting low.   Walk with walker as instructed.  You may resume a sexual relationship in one month or when given the OK by your caregiver.  Use walker as long as suggested by your caregivers.  Avoid periods of inactivity such as sitting longer than an hour when not asleep. This helps prevent blood clots.  You may return to work once you are cleared by your surgeon.  Do not drive a car for 6 weeks or until released by your surgeon.  Do not drive while taking narcotics.  Wear elastic stockings for two weeks following surgery during the day but you may remove then at night.  Make sure you keep all of your appointments after your operation with all of your doctors and caregivers. You should call the office at the above phone number and make an appointment for approximately two weeks after the date of your surgery. Please pick up a stool softener and laxative  for home use as long as you are requiring pain medications.  ICE to the affected hip every three hours for 30 minutes at a time and then as needed for pain and swelling. Continue to use ice on the hip for pain and swelling from surgery. You may notice swelling that will progress down to the foot and ankle.  This is normal after surgery.  Elevate the leg when you are not up walking on it.   It is important for you to complete the blood thinner medication as prescribed by your doctor.  Continue to use the breathing machine which will help keep your temperature down.  It is common for your temperature to cycle up and down following surgery, especially at night when you are not up moving around and exerting yourself.  The breathing machine keeps your lungs expanded and your temperature down.  RANGE OF MOTION AND STRENGTHENING EXERCISES  These exercises are designed to help you keep full movement of your hip joint. Follow your caregiver's or physical therapist's instructions. Perform all exercises about fifteen times, three times per day or as directed. Exercise both hips, even if you have had only one joint replacement. These exercises can be done on a training (exercise) mat, on the floor, on a table or on a bed. Use whatever works the best and is most comfortable for you. Use music or television while you are exercising so that the exercises are a pleasant break in your day. This   will make your life better with the exercises acting as a break in routine you can look forward to.  Lying on your back, slowly slide your foot toward your buttocks, raising your knee up off the floor. Then slowly slide your foot back down until your leg is straight again.  Lying on your back spread your legs as far apart as you can without causing discomfort.  Lying on your side, raise your upper leg and foot straight up from the floor as far as is comfortable. Slowly lower the leg and repeat.  Lying on your back, tighten up the  muscle in the front of your thigh (quadriceps muscles). You can do this by keeping your leg straight and trying to raise your heel off the floor. This helps strengthen the largest muscle supporting your knee.  Lying on your back, tighten up the muscles of your buttocks both with the legs straight and with the knee bent at a comfortable angle while keeping your heel on the floor.   SKILLED REHAB INSTRUCTIONS: If the patient is transferred to a skilled rehab facility following release from the hospital, a list of the current medications will be sent to the facility for the patient to continue.  When discharged from the skilled rehab facility, please have the facility set up the patient's Home Health Physical Therapy prior to being released. Also, the skilled facility will be responsible for providing the patient with their medications at time of release from the facility to include their pain medication and their blood thinner medication. If the patient is still at the rehab facility at time of the two week follow up appointment, the skilled rehab facility will also need to assist the patient in arranging follow up appointment in our office and any transportation needs.  MAKE SURE YOU:  Understand these instructions.  Will watch your condition.  Will get help right away if you are not doing well or get worse.  Pick up stool softner and laxative for home use following surgery while on pain medications. Daily dry dressing changes as needed. In 4 days, you may remove your dressings and begin taking showers - no tub baths or soaking the incisions. Continue to use ice for pain and swelling after surgery. Do not use any lotions or creams on the incision until instructed by your surgeon.   

## 2020-01-13 NOTE — Anesthesia Postprocedure Evaluation (Signed)
Anesthesia Post Note  Patient: Shelly Obrien  Procedure(s) Performed: INTRAMEDULLARY (IM) NAIL INTERTROCHANTRIC (Right )     Patient location during evaluation: PACU Anesthesia Type: General Level of consciousness: sedated and patient cooperative Pain management: pain level controlled Vital Signs Assessment: post-procedure vital signs reviewed and stable Respiratory status: spontaneous breathing Cardiovascular status: stable Anesthetic complications: no   No complications documented.  Last Vitals:  Vitals:   01/13/20 1430 01/13/20 1445  BP: 129/89 (!) 142/109  Pulse: 62 (!) 55  Resp: (!) 8 19  Temp:    SpO2: 100% 91%    Last Pain:  Vitals:   01/13/20 1445  TempSrc:   PainSc: Asleep                 Nolon Nations

## 2020-01-13 NOTE — Anesthesia Procedure Notes (Signed)

## 2020-01-14 ENCOUNTER — Inpatient Hospital Stay (HOSPITAL_COMMUNITY): Payer: Medicare Other

## 2020-01-14 ENCOUNTER — Encounter (HOSPITAL_COMMUNITY): Payer: Self-pay | Admitting: Orthopedic Surgery

## 2020-01-14 DIAGNOSIS — Z419 Encounter for procedure for purposes other than remedying health state, unspecified: Secondary | ICD-10-CM

## 2020-01-14 LAB — CBC
HCT: 33.9 % — ABNORMAL LOW (ref 36.0–46.0)
Hemoglobin: 10 g/dL — ABNORMAL LOW (ref 12.0–15.0)
MCH: 29 pg (ref 26.0–34.0)
MCHC: 29.5 g/dL — ABNORMAL LOW (ref 30.0–36.0)
MCV: 98.3 fL (ref 80.0–100.0)
Platelets: 175 10*3/uL (ref 150–400)
RBC: 3.45 MIL/uL — ABNORMAL LOW (ref 3.87–5.11)
RDW: 18.2 % — ABNORMAL HIGH (ref 11.5–15.5)
WBC: 10.9 10*3/uL — ABNORMAL HIGH (ref 4.0–10.5)
nRBC: 0 % (ref 0.0–0.2)

## 2020-01-14 LAB — BASIC METABOLIC PANEL
Anion gap: 7 (ref 5–15)
BUN: 28 mg/dL — ABNORMAL HIGH (ref 8–23)
CO2: 28 mmol/L (ref 22–32)
Calcium: 8.9 mg/dL (ref 8.9–10.3)
Chloride: 103 mmol/L (ref 98–111)
Creatinine, Ser: 1.49 mg/dL — ABNORMAL HIGH (ref 0.44–1.00)
GFR calc Af Amer: 34 mL/min — ABNORMAL LOW (ref >60)
GFR calc non Af Amer: 30 mL/min — ABNORMAL LOW (ref >60)
Glucose, Bld: 185 mg/dL — ABNORMAL HIGH (ref 70–99)
Potassium: 4.7 mmol/L (ref 3.5–5.1)
Sodium: 138 mmol/L (ref 135–145)

## 2020-01-14 MED ORDER — SODIUM CHLORIDE 0.9 % IV SOLN: INTRAVENOUS | Status: DC

## 2020-01-14 MED ORDER — APIXABAN 2.5 MG PO TABS
2.5000 mg | ORAL_TABLET | Freq: Two times a day (BID) | ORAL | Status: DC
  Administered 2020-01-14 – 2020-01-17 (×7): 2.5 mg via ORAL
  Filled 2020-01-14 (×7): qty 1

## 2020-01-14 MED ORDER — POLYVINYL ALCOHOL 1.4 % OP SOLN
1.0000 [drp] | OPHTHALMIC | Status: DC | PRN
  Administered 2020-01-14: 1 [drp] via OPHTHALMIC
  Filled 2020-01-14: qty 15

## 2020-01-14 MED ORDER — HYDROCODONE-ACETAMINOPHEN 5-325 MG PO TABS: 1.0000 | ORAL_TABLET | ORAL | 0 refills | Status: DC | PRN

## 2020-01-14 NOTE — Progress Notes (Signed)
Pharmacy Brief Note - Post-op Anticoagulation:  Pt is a 89yoF admitted with a right hip fracture s/p fall. Pt on apixaban PTA for atrial fibrillation. CT head on admission was negative for any hemorrhage.   Pt underwent surgery on 8/11: intramedullary IM nail intertrochantric (right). Pharmacy consulted to resume home apixaban on POD#1 if Hgb >9.   Assessment:  Hgb 10,  Plt WNL  SCr 1.5  Confirmed with patient that she currently takes apixaban 2.5 mg PO BID  Plan:  Resume apixaban 2.5 mg PO BID today as Hgb >9  Pharmacy to sign off.   Lenis Noon, PharmD 01/14/20 12:16 PM

## 2020-01-14 NOTE — Progress Notes (Signed)
    Subjective:  Patient reports pain as mild to moderate.  Denies N/V/CP/SOB. The patient says she is relatively pain free while lying in bed but is experiencing a moderate amount of pain with movement.   Objective:   VITALS:   Vitals:   01/13/20 2019 01/13/20 2147 01/14/20 0130 01/14/20 0609  BP:  92/65 99/73 109/81  Pulse: 87 83 76 89  Resp: 17 17 17 16   Temp: 97.6 F (36.4 C) 97.6 F (36.4 C) 97.6 F (36.4 C) (!) 97.4 F (36.3 C)  TempSrc: Oral Oral Oral Oral  SpO2: 96% 96% 95% 97%  Weight:      Height:        NAD ABD soft Neurovascular intact Sensation intact distally Intact pulses distally Dorsiflexion/Plantar flexion intact Incision: dressing C/D/I   Lab Results  Component Value Date   WBC 10.9 (H) 01/14/2020   HGB 10.0 (L) 01/14/2020   HCT 33.9 (L) 01/14/2020   MCV 98.3 01/14/2020   PLT 175 01/14/2020   BMET    Component Value Date/Time   NA 138 01/14/2020 0302   NA 144 07/17/2019 1225   K 4.7 01/14/2020 0302   CL 103 01/14/2020 0302   CO2 28 01/14/2020 0302   GLUCOSE 185 (H) 01/14/2020 0302   BUN 28 (H) 01/14/2020 0302   BUN 20 07/17/2019 1225   CREATININE 1.49 (H) 01/14/2020 0302   CREATININE 1.09 (H) 10/15/2018 1403   CREATININE 1.01 (H) 05/14/2016 0944   CALCIUM 8.9 01/14/2020 0302   GFRNONAA 30 (L) 01/14/2020 0302   GFRNONAA 43 (L) 10/15/2018 1403   GFRAA 34 (L) 01/14/2020 0302   GFRAA 50 (L) 10/15/2018 1403     Assessment/Plan: 1 Day Post-Op   Active Problems:   Essential hypertension   CAD (coronary artery disease)   CKD (chronic kidney disease) stage 3, GFR 30-59 ml/min (HCC)   PAF (paroxysmal atrial fibrillation) (HCC)   DNR (do not resuscitate)   MGUS (monoclonal gammopathy of unknown significance)   Hip fracture (HCC)   Displaced fracture of right femoral neck (HCC)   Fall   WBAT with walker DVT ppx: Apixaban, SCDs, TEDS PO pain control PT/OT Dispo: Anticipate D/C to SNF, PT at rehab, plan on 2.5mg  Apixaban for  48hrs followed by 2.5mg  BID   Dorothyann Peng 01/14/2020, 8:06 AM  [place] is now [facility] [address], [place], [place], [place] [address] Phone: [phone] [email or web address] Facebook  [employer]

## 2020-01-14 NOTE — TOC Initial Note (Signed)
Transition of Care (TOC) - Initial/Assessment Note    Patient Details  Name: Shelly Obrien MRN: [id number] Date of Birth: 02/01/1923  Transition of Care (TOC) CM/SW Contact:    HOYLE, LUCY, LCSW Phone Number: 01/14/2020, 10:52 AM  Clinical Narrative:                 Met with pt and son to review d/c plans.  Both agreed on plan for SNF for short term rehab.  Reviewed preferred facilities and have begun bed search.   Unfortunately, pt has only completed one of her COVID vaccines which will likely mean she will be placed in 2 week quarantine at SNF.   Continue to follow.  Expected Discharge Plan: Skilled Nursing Facility Barriers to Discharge: Continued Medical Work up   Patient Goals and CMS Choice   CMS Medicare.gov Compare Post Acute Care list provided to:: Patient Choice offered to / list presented to : Patient  Expected Discharge Plan and Services Expected Discharge Plan: Skilled Nursing Facility In-house Referral: Clinical Social Work   Post Acute Care Choice: Skilled Nursing Facility Living arrangements for the past 2 months: Single Family Home                 DME Arranged: N/A DME Agency: NA       HH Arranged: NA HH Agency: NA        Prior Living Arrangements/Services Living arrangements for the past 2 months: Single Family Home Lives with:: Self Patient language and need for interpreter reviewed:: No Do you feel safe going back to the place where you live?: Yes      Need for Family Participation in Patient Care: No (Comment) Care giver support system in place?: Yes (comment)   Criminal Activity/Legal Involvement Pertinent to Current Situation/Hospitalization: No - Comment as needed  Activities of Daily Living Home Assistive Devices/Equipment: Walker (specify type), Cane (specify quad or straight), Eyeglasses, Grab bars in shower, Shower chair with back, Raised toilet seat with rails ADL Screening (condition at time of admission) Patient's cognitive ability  adequate to safely complete daily activities?: Yes Is the patient deaf or have difficulty hearing?: No Does the patient have difficulty seeing, even when wearing glasses/contacts?: Yes Does the patient have difficulty concentrating, remembering, or making decisions?: No Patient able to express need for assistance with ADLs?: Yes Does the patient have difficulty dressing or bathing?: No Independently performs ADLs?: Yes (appropriate for developmental age) Does the patient have difficulty walking or climbing stairs?: Yes Weakness of Legs: Right Weakness of Arms/Hands: None  Permission Sought/Granted Permission sought to share information with : Facility Contact Representative, Family Supports    Share Information with NAME: Bobby Mitchell     Permission granted to share info w Relationship: son  Permission granted to share info w Contact Information: [phone]  Emotional Assessment Appearance:: Appears stated age Attitude/Demeanor/Rapport: Engaged, Gracious Affect (typically observed): Accepting, Pleasant Orientation: : Oriented to Self, Oriented to Place, Oriented to  Time, Oriented to Situation Alcohol / Substance Use: Not Applicable Psych Involvement: No (comment)  Admission diagnosis:  Hip fracture (HCC) [S72.009A] Fall [W19.XXXA] Hip fracture, right (HCC) [S72.001A] Closed nondisplaced intertrochanteric fracture of right femur, initial encounter (HCC) [S72.144A] Displaced fracture of right femoral neck (HCC) [S72.001A] Patient Active Problem List   Diagnosis Date Noted  . Hip fracture (HCC) 01/12/2020  . Displaced fracture of right femoral neck (HCC) 01/12/2020  . Fall 01/12/2020  . Closed nondisplaced intertrochanteric fracture of right femur (HCC)   . Severe sepsis (  HCC)   . Cellulitis of right leg 10/30/2019  . Hyperkalemia 10/30/2019  . SIRS (systemic inflammatory response syndrome) (HCC) 10/30/2019  . MGUS (monoclonal gammopathy of unknown significance) 10/22/2019   . Cellulitis 10/17/2019  . Acute kidney injury superimposed on CKD (HCC) 10/17/2019  . DNR (do not resuscitate) 10/17/2019  . Longstanding persistent atrial fibrillation (HCC)   . Cardiomyopathy (HCC) 07/14/2019  . Systolic CHF (HCC) 07/14/2019  . PAF (paroxysmal atrial fibrillation) (HCC) 07/12/2019  . Peripheral neuropathy 08/12/2018  . B12 deficiency 08/12/2018  . Constipation   . Acute on chronic cholecystitis s/p lap cholecystectomy 12/06/2017 12/04/2017  . Nephrolithiasis 12/04/2017  . Abdominal pain 12/04/2017  . Spinal stenosis in cervical region 04/24/2017  . Spinal stenosis of lumbar region 04/24/2017  . Dizziness 04/24/2017  . Ureteral stone with hydronephrosis 01/20/2017  . Atrial fibrillation with RVR (HCC) 02/24/2016  . CKD (chronic kidney disease) stage 3, GFR 30-59 ml/min (HCC) 02/24/2016  . Paroxysmal atrial fibrillation (HCC) 02/24/2016  . Exudative macular degeneration (HCC) 02/24/2016  . CHOLELITHIASIS 02/14/2009  . Hyperlipidemia 12/29/2008  . Essential hypertension 12/29/2008  . MYOCARDIAL INFARCTION, HX OF 12/29/2008  . CAD (coronary artery disease) 12/29/2008   PCP:  Griffin, John, MD Pharmacy:   [id number] - [place], [place] - [address] AT [address] [place] [address] Phone: [phone] Fax: [phone]  [facility] - [place], [place] - [address] [place] [address] Phone: [phone] Fax: [phone]     Social Determinants of Health (SDOH) Interventions    Readmission Risk Interventions Readmission Risk Prevention Plan 01/14/2020  Transportation Screening Complete  PCP or Specialist Appt within 5-7 Days Complete  Home Care Screening Complete  Medication Review (RN CM) Complete  Some recent data might be hidden    

## 2020-01-14 NOTE — Evaluation (Signed)
Physical Therapy Evaluation Patient Details Name: Shelly Obrien MRN: [id number] DOB: 1923/03/01 Today's Date: 01/14/2020   History of Present Illness  86 y.o. female with history of CAD status post stenting, A. fib, chronic kidney disease stage III, B12 deficiency presents to the ER the patient had a fall at home and sustained right intertrochanteric femur fracture.  Pt s/p right femur IM nail 01/13/20  Clinical Impression  Patient is s/p above surgery resulting in functional limitations due to the deficits listed below (see PT Problem List).  Patient will benefit from skilled PT to increase their independence and safety with mobility to allow discharge to the venue listed below.  Pt with recent admission for LE cellulitis and had a fall home reaching to pick up her cane.  Pt with pain and weakness limiting mobility at this time.  Pt able to stand pivot to recliner however requiring +2 assist.  Recommend SNF upon d/c.      Follow Up Recommendations SNF    Equipment Recommendations  None recommended by PT    Recommendations for Other Services       Precautions / Restrictions Precautions Precautions: Fall Restrictions Weight Bearing Restrictions: No      Mobility  Bed Mobility Overal bed mobility: Needs Assistance Bed Mobility: Supine to Sit     Supine to sit: Mod assist;+2 for safety/equipment;HOB elevated     General bed mobility comments: verbal cues for technique, assist for bringing R LE over EOB, assist for trunk upright  Transfers Overall transfer level: Needs assistance Equipment used: Rolling walker (2 wheeled) Transfers: Sit to/from [facility] Sit to Stand: Mod assist Stand pivot transfers: Max assist       General transfer comment: verbal cues for hand placement and weight shifting, assist for rise and steady, pt lifting RW instead of weight bearing requiring mod cues, assist for weakness with small steps to pivot to  recliner  Ambulation/Gait                Stairs            Wheelchair Mobility    Modified Rankin (Stroke Patients Only)       Balance Overall balance assessment: History of Falls                                           Pertinent Vitals/Pain Pain Assessment: 0-10 Pain Score: 5  Pain Location: R IM nail Pain Descriptors / Indicators: Aching;Sore Pain Intervention(s): Repositioned;Monitored during session    Home Living Family/patient expects to be discharged to:: Skilled nursing facility Living Arrangements: Alone   Type of Home: House Home Access: Stairs to enter   Entrance Stairs-Number of Steps: 1 Home Layout: One level Home Equipment: [place] - 2 wheels;Cane - single point      Prior Function Level of Independence: Independent with assistive device(s)               Hand Dominance        Extremity/Trunk Assessment        Lower Extremity Assessment Lower Extremity Assessment: Generalized weakness;RLE deficits/detail RLE Deficits / Details: requiring assist due to weakness and pain       Communication   Communication: No difficulties  Cognition Arousal/Alertness: Awake/alert Behavior During Therapy: WFL for tasks assessed/performed Overall Cognitive Status: Within Functional Limits for tasks assessed  General Comments      Exercises     Assessment/Plan    PT Assessment Patient needs continued PT services  PT Problem List Decreased strength;Decreased mobility;Decreased balance;Decreased knowledge of use of DME;Decreased activity tolerance;Decreased knowledge of precautions;Pain       PT Treatment Interventions DME instruction;Therapeutic exercise;Gait training;Balance training;Functional mobility training;Therapeutic activities;Patient/family education    PT Goals (Current goals can be found in the Care Plan section)  Acute Rehab PT Goals PT Goal  Formulation: With patient Time For Goal Achievement: 01/28/20 Potential to Achieve Goals: Good    Frequency Min 3X/week   Barriers to discharge        Co-evaluation               AM-PAC PT "6 Clicks" Mobility  Outcome Measure Help needed turning from your back to your side while in a flat bed without using bedrails?: A Lot Help needed moving from lying on your back to sitting on the side of a flat bed without using bedrails?: A Lot Help needed moving to and from a bed to a chair (including a wheelchair)?: A Lot Help needed standing up from a chair using your arms (e.g., wheelchair or bedside chair)?: A Lot Help needed to walk in hospital room?: Total Help needed climbing 3-5 steps with a railing? : Total 6 Click Score: 10    End of Session Equipment Utilized During Treatment: Gait belt;Oxygen Activity Tolerance: Patient tolerated treatment well Patient left: in chair;with call bell/phone within reach;with chair alarm set   PT Visit Diagnosis: Other abnormalities of gait and mobility (R26.89)    Time: 9191-6606 PT Time Calculation (min) (ACUTE ONLY): 23 min   Charges:   PT Evaluation $PT Eval Low Complexity: 1 Low     Kati PT, DPT Acute Rehabilitation Services Pager: [phone] Office: [phone]  York Ram E 01/14/2020, 12:01 PM

## 2020-01-14 NOTE — Plan of Care (Signed)
  Problem: Education: Goal: Verbalization of understanding the information provided (i.e., activity precautions, restrictions, etc) will improve Outcome: Progressing Goal: Individualized Educational Video(s) Outcome: Progressing   Problem: Activity: Goal: Ability to ambulate and perform ADLs will improve Outcome: Progressing   Problem: Clinical Measurements: Goal: Postoperative complications will be avoided or minimized Outcome: Progressing   

## 2020-01-14 NOTE — TOC Progression Note (Signed)
Transition of Care [facility]) - Progression Note    Patient Details  Name: Shelly Obrien MRN: [id number] Date of Birth: 12-06-1922  Transition of [facility]) CM/SW Contact  Lennart Pall, LCSW Phone Number: 01/14/2020, 2:02 PM  Clinical Narrative:     Have received SNF bed offer from [facility] and pt/ son have accepted.  They are aware that pt will need to be placed in two week quarantine due to being unvaccinated.  MD hopeful she will be ready to transfer tomorrow.  Expected Discharge Plan: [place] Barriers to Discharge: Continued Medical Work up  Expected Discharge Plan and Services Expected Discharge Plan: [place] In-house Referral: Clinical Social Work   Post Acute Care Choice: [place] Living arrangements for the past 2 months: Single Family Home                 DME Arranged: N/A DME Agency: NA       HH Arranged: NA HH Agency: NA         Social Determinants of Health (SDOH) Interventions    Readmission Risk Interventions Readmission Risk Prevention Plan 01/14/2020  Transportation Screening Complete  PCP or Specialist Appt within 5-7 Days Complete  Home Care Screening Complete  Medication Review (RN CM) Complete  Some recent data might be hidden

## 2020-01-14 NOTE — Progress Notes (Signed)
PROGRESS NOTE    Shelly Obrien  GUR:[id number] DOB: [birth year] DOA: 01/12/2020 PCP: [name], MD    Brief Narrative: Shelly Obrien is a 86 y.o. female with history of CAD status post stenting, A. fib, chronic kidney disease stage III, B12 deficiency presents to the ER the patient had a fall at home.  Patient states she was bending down to pick something when she lost balance and fell onto the floor hit her head did not lose consciousness.  Denies any chest pain or shortness of breath.  She took about 2 hours to gain his strength and to reach the phone and called her neighbor who in turn called the EMS and was brought to the ER.  ED Course: X-rays in the ER showed right hip fracture.  Dr. Alvan Dame orthopedic surgeon was notified.  Patient's last dose of Eliquis was yesterday morning.  EKG shows A. fib rate controlled labs are creatinine 1.9 Covid test negative CT head and C-spine unremarkable.  Assessment & Plan:   Active Problems:   Essential hypertension   CAD (coronary artery disease)   CKD (chronic kidney disease) stage 3, GFR 30-59 ml/min (HCC)   PAF (paroxysmal atrial fibrillation) (HCC)   DNR (do not resuscitate)   MGUS (monoclonal gammopathy of unknown significance)   Hip fracture (HCC)   Displaced fracture of right femoral neck ([place])   Fall   #1 right hip fracture status post mechanical fall status post intramedullary fixation 01/13/2020 Restarted Eliquis 2.5 mg for 48 hrs then 2.5 mg bid  stool softeners Pain control  #2 chronic atrial fibrillation -rate controlled however had to hold amiodarone and metoprolol today due to soft blood pressure 91/59.  Continue Eliquis.    #3 acute on chronic CKD stage IIIb follow-up renal functions in a.m. creatinine 1.49 down from 1.91.  #4 history of systolic congestive heart failure echocardiogram in February 2021 with ejection fraction 25 to 30%.  She is on Lasix 80 mg daily at home will need to restart this at some point.  Continue to  hold as she appears dry and soft blood pressure.  Slow IV hydration today.  #5 history of CAD and stent on beta-blocker amiodarone and Eliquis  #6 history of B12 deficiency continue replacement  #7  Acquired thrombophilia/chronic atrial fibrillation patient on Eliquis with high risk for blood clots.   Nutrition Problem: Increased nutrient needs Etiology: post-op healing     Signs/Symptoms: estimated needs    Interventions: Ensure Enlive (each supplement provides 350kcal and 20 grams of protein)  Estimated body mass index is 27.88 kg/m as calculated from the following:   Height as of this encounter: 5\' 5"  (1.651 m).   Weight as of this encounter: 76 kg.  DVT prophylaxis: SCD/Eliquis Code Status: DNR Family Communication: None at bedside  disposition Plan:  Status is: Inpatient  Dispo: The patient is from:snf              Anticipated d/c is to:snf              Anticipated d/c date is unknown              Patient currently is not medically stable to d/c.    Consultants: Ortho Procedures: Hip surgery 01/13/2020 Antimicrobials: None  Subjective: She is awake alert resting in bed Complains of pain with movement no nausea vomiting Objective: Vitals:   01/13/20 2147 01/14/20 0130 01/14/20 0609 01/14/20 0935  BP: 92/65 99/73 109/81 (!) 91/59  Pulse: 83 76  89 87  Resp: 17 17 16 16   Temp: 97.6 F (36.4 C) 97.6 F (36.4 C) (!) 97.4 F (36.3 C) 98.2 F (36.8 C)  TempSrc: Oral Oral Oral Oral  SpO2: 96% 95% 97% 100%  Weight:      Height:        Intake/Output Summary (Last 24 hours) at 01/14/2020 1120 Last data filed at 01/14/2020 2426 Gross per 24 hour  Intake 2273.38 ml  Output 925 ml  Net 1348.38 ml   Filed Weights   01/12/20 2242  Weight: 76 kg    Examination:  General exam: Appears calm and comfortable  Respiratory system: Clear to auscultation. Respiratory effort normal. Cardiovascular system: S1 & S2 heard, RRR. No JVD, murmurs, rubs, gallops or  clicks. No pedal edema. Gastrointestinal system: Abdomen is nondistended, soft and nontender. No organomegaly or masses felt. Normal bowel sounds heard. Central nervous system: Alert and oriented. No focal neurological deficits. Extremities:trace edema incision site covered with dressing. Skin: No rashes, lesions or ulcers Psychiatry: Judgement and insight appear normal. Mood & affect appropriate.     Data Reviewed: I have personally reviewed following labs and imaging studies  CBC: Recent Labs  Lab 01/12/20 0241 01/14/20 0302  WBC 10.3 10.9*  HGB 12.1 10.0*  HCT 41.3 33.9*  MCV 95.2 98.3  PLT 208 834   Basic Metabolic Panel: Recent Labs  Lab 01/12/20 0241 01/14/20 0302  NA 144 138  K 4.1 4.7  CL 103 103  CO2 28 28  GLUCOSE 147* 185*  BUN 21 28*  CREATININE 1.91* 1.49*  CALCIUM 9.6 8.9   GFR: Estimated Creatinine Clearance: 23 mL/min (A) (by C-G formula based on SCr of 1.49 mg/dL (H)). Liver Function Tests: No results for input(s): AST, ALT, ALKPHOS, BILITOT, PROT, ALBUMIN in the last 168 hours. No results for input(s): LIPASE, AMYLASE in the last 168 hours. No results for input(s): AMMONIA in the last 168 hours. Coagulation Profile: Recent Labs  Lab 01/12/20 0241  INR 1.3*   Cardiac Enzymes: No results for input(s): CKTOTAL, CKMB, CKMBINDEX, TROPONINI in the last 168 hours. BNP (last 3 results) No results for input(s): PROBNP in the last 8760 hours. HbA1C: No results for input(s): HGBA1C in the last 72 hours. CBG: No results for input(s): GLUCAP in the last 168 hours. Lipid Profile: No results for input(s): CHOL, HDL, LDLCALC, TRIG, CHOLHDL, LDLDIRECT in the last 72 hours. Thyroid Function Tests: No results for input(s): TSH, T4TOTAL, FREET4, T3FREE, THYROIDAB in the last 72 hours. Anemia Panel: No results for input(s): VITAMINB12, FOLATE, FERRITIN, TIBC, IRON, RETICCTPCT in the last 72 hours. Sepsis Labs: No results for input(s): PROCALCITON,  LATICACIDVEN in the last 168 hours.  Recent Results (from the past 240 hour(s))  SARS Coronavirus 2 by RT PCR (hospital order, performed in [facility] hospital lab) Nasopharyngeal Nasopharyngeal Swab     Status: None   Collection Time: 01/12/20  2:28 AM   Specimen: Nasopharyngeal Swab  Result Value Ref Range Status   SARS Coronavirus 2 NEGATIVE NEGATIVE Final    Comment: (NOTE) SARS-CoV-2 target nucleic acids are NOT DETECTED.  The SARS-CoV-2 RNA is generally detectable in upper and lower respiratory specimens during the acute phase of infection. The lowest concentration of SARS-CoV-2 viral copies this assay can detect is 250 copies / mL. A negative result does not preclude SARS-CoV-2 infection and should not be used as the sole basis for treatment or other patient management decisions.  A negative result may occur with improper specimen  collection / handling, submission of specimen other than nasopharyngeal swab, presence of viral mutation(s) within the areas targeted by this assay, and inadequate number of viral copies (<250 copies / mL). A negative result must be combined with clinical observations, patient history, and epidemiological information.  Fact Sheet for Patients:   StrictlyIdeas.no  Fact Sheet for Healthcare Providers: BankingDealers.co.za  This test is not yet approved or  cleared by the [place] FDA and has been authorized for detection and/or diagnosis of SARS-CoV-2 by FDA under an Emergency Use Authorization (EUA).  This EUA will remain in effect (meaning this test can be used) for the duration of the COVID-19 declaration under Section 564(b)(1) of the Act, 21 U.S.C. section 360bbb-3(b)(1), unless the authorization is terminated or revoked sooner.  Performed at [facility], [place] [address] [place], [place] [address]   Surgical PCR screen     Status: None   Collection Time: 01/13/20 12:56 AM    Specimen: Nasal Mucosa; Nasal Swab  Result Value Ref Range Status   MRSA, PCR NEGATIVE NEGATIVE Final   Staphylococcus aureus NEGATIVE NEGATIVE Final    Comment: (NOTE) The Xpert SA Assay (FDA approved for NASAL specimens in patients 27 years of age and older), is one component of a comprehensive surveillance program. It is not intended to diagnose infection nor to guide or monitor treatment. Performed at [facility], [place] [address], [place], [place] [address]          Radiology Studies: Pelvis Portable  Result Date: 01/13/2020 CLINICAL DATA:  Postop hip surgery. EXAM: PORTABLE PELVIS 1-2 VIEWS COMPARISON:  01/12/2020 FINDINGS: Fracture of the right lower femoral neck is been fixed with a threaded screw and locking intramedullary rod. Fracture alignment satisfactory. Hardware in good position. Right hip joint space normal. No other fracture. IMPRESSION: Satisfactory fixation of right femoral neck fracture. Electronically Signed   By: Franchot Gallo M.D.   On: 01/13/2020 16:11   DG Knee Right Port  Result Date: 01/12/2020 CLINICAL DATA:  Right hip fracture.  Pain. EXAM: PORTABLE RIGHT KNEE - 1-2 VIEW COMPARISON:  10/29/2019 FINDINGS: No acute fracture. No malalignment. Trace knee joint effusion. Moderate medial compartment osteoarthritis. Bones are demineralized. Soft tissues unremarkable. IMPRESSION: 1. No acute osseous abnormality, right knee. 2. Moderate medial compartment osteoarthritis. Electronically Signed   By: Davina Poke D.O.   On: 01/12/2020 12:45   DG C-Arm 1-60 Min-No Report  Result Date: 01/13/2020 Fluoroscopy was utilized by the requesting physician.  No radiographic interpretation.   DG FEMUR, MIN 2 VIEWS RIGHT  Result Date: 01/13/2020 CLINICAL DATA:  Right femoral IM nail EXAM: RIGHT FEMUR 2 VIEWS COMPARISON:  01/12/2020 FINDINGS: Internal fixation across the right femoral intertrochanteric fracture. Anatomic alignment. No hardware bony  complicating feature. IMPRESSION: Internal fixation.  No visible complicating feature. Electronically Signed   By: Rolm Baptise M.D.   On: 01/13/2020 15:29        Scheduled Meds: . amiodarone  200 mg Oral BID  . apixaban  2.5 mg Oral BID  . Chlorhexidine Gluconate Cloth  6 each Topical Daily  . docusate sodium  200 mg Oral BID  . feeding supplement (ENSURE ENLIVE)  237 mL Oral Q24H  . metoprolol tartrate  50 mg Oral BID  . polyethylene glycol  17 g Oral Daily   Continuous Infusions:    LOS: 2 days     Georgette Shell, MD 01/14/2020, 11:20 AM

## 2020-01-14 NOTE — NC FL2 (Signed)
[place] MEDICAID FL2 LEVEL OF CARE SCREENING TOOL     IDENTIFICATION  Patient Name: Shelly Obrien Birthdate: [birth year] Sex: female Admission Date (Current Location): 01/12/2020  [facility] and [place] Number:  Herbalist and Address:  [facility],  [place], [place]      Provider Number: [id number]  Attending Physician Name and Address:  Georgette Shell, MD  Relative Name and Phone Number:  son, [name] @ [phone]    Current Level of Care: Hospital Recommended Level of Care: [place] Prior Approval Number:    Date Approved/Denied:   PASRR Number: [id number]  Discharge Plan: SNF    Current Diagnoses: Patient Active Problem List   Diagnosis Date Noted  . Hip fracture ([place]) 01/12/2020  . Displaced fracture of right femoral neck ([place]) 01/12/2020  . Fall 01/12/2020  . Closed nondisplaced intertrochanteric fracture of right femur ([place])   . Severe sepsis ([place])   . Cellulitis of right leg 10/30/2019  . Hyperkalemia 10/30/2019  . SIRS (systemic inflammatory response syndrome) ([place]) 10/30/2019  . MGUS (monoclonal gammopathy of unknown significance) 10/22/2019  . Cellulitis 10/17/2019  . Acute kidney injury superimposed on CKD ([place]) 10/17/2019  . DNR (do not resuscitate) 10/17/2019  . Longstanding persistent atrial fibrillation ([place])   . Cardiomyopathy ([place]) 07/14/2019  . Systolic CHF ([place]) 43/32/9518  . PAF (paroxysmal atrial fibrillation) ([place]) 07/12/2019  . Peripheral neuropathy 08/12/2018  . B12 deficiency 08/12/2018  . Constipation   . Acute on chronic cholecystitis s/p lap cholecystectomy 12/06/2017 12/04/2017  . Nephrolithiasis 12/04/2017  . Abdominal pain 12/04/2017  . Spinal stenosis in cervical region 04/24/2017  . Spinal stenosis of lumbar region 04/24/2017  . Dizziness 04/24/2017  . Ureteral stone with hydronephrosis 01/20/2017  . Atrial fibrillation with RVR ([place]) 02/24/2016  . CKD  (chronic kidney disease) stage 3, GFR 30-59 ml/min (HCC) 02/24/2016  . Paroxysmal atrial fibrillation ([place]) 02/24/2016  . Exudative macular degeneration ([place]) 02/24/2016  . CHOLELITHIASIS 02/14/2009  . Hyperlipidemia 12/29/2008  . Essential hypertension 12/29/2008  . MYOCARDIAL INFARCTION, HX OF 12/29/2008  . CAD (coronary artery disease) 12/29/2008    Orientation RESPIRATION BLADDER Height & Weight     Self, Time, Situation, Place  Normal Continent Weight: 167 lb 8.8 oz (76 kg) Height:  5\' 5"  (165.1 cm)  BEHAVIORAL SYMPTOMS/MOOD NEUROLOGICAL BOWEL NUTRITION STATUS      Continent    AMBULATORY STATUS COMMUNICATION OF NEEDS Skin   Extensive Assist Verbally Surgical wounds                       Personal Care Assistance Level of Assistance  Bathing, Dressing Bathing Assistance: Limited assistance   Dressing Assistance: Limited assistance     Functional Limitations Info             SPECIAL CARE FACTORS FREQUENCY  PT (By licensed PT), OT (By licensed OT)     PT Frequency: 5x/wk OT Frequency: 5x/wk            Contractures      Additional Factors Info  Code Status, Allergies Code Status Info: DNR             Current Medications (01/14/2020):  This is the current hospital active medication list Current Facility-Administered Medications  Medication Dose Route Frequency Provider Last Rate Last Admin  . acetaminophen (TYLENOL) tablet 325-650 mg  325-650 mg Oral Q6H PRN Rod Can, MD      . amiodarone (  PACERONE) tablet 200 mg  200 mg Oral BID Rod Can, MD   200 mg at 01/13/20 0923  . apixaban (ELIQUIS) tablet 2.5 mg  2.5 mg Oral BID Lenis Noon, RPH   2.5 mg at 01/14/20 1030  . Chlorhexidine Gluconate Cloth 2 % PADS 6 each  6 each Topical Daily Swinteck, Aaron Edelman, MD      . docusate sodium (COLACE) capsule 200 mg  200 mg Oral BID Georgette Shell, MD   200 mg at 01/14/20 1029  . feeding supplement (ENSURE ENLIVE) (ENSURE ENLIVE) liquid 237 mL   237 mL Oral Q24H Rod Can, MD   237 mL at 01/13/20 1917  . HYDROcodone-acetaminophen (NORCO) 7.5-325 MG per tablet 1-2 tablet  1-2 tablet Oral Q4H PRN Swinteck, Aaron Edelman, MD      . HYDROcodone-acetaminophen (NORCO/VICODIN) 5-325 MG per tablet 1-2 tablet  1-2 tablet Oral Q4H PRN Rod Can, MD   1 tablet at 01/14/20 0826  . menthol-cetylpyridinium (CEPACOL) lozenge 3 mg  1 lozenge Oral PRN Swinteck, Aaron Edelman, MD       Or  . phenol (CHLORASEPTIC) mouth spray 1 spray  1 spray Mouth/Throat PRN Swinteck, Aaron Edelman, MD      . metoCLOPramide (REGLAN) tablet 5-10 mg  5-10 mg Oral Q8H PRN Swinteck, Aaron Edelman, MD       Or  . metoCLOPramide (REGLAN) injection 5-10 mg  5-10 mg Intravenous Q8H PRN Swinteck, Aaron Edelman, MD      . metoprolol tartrate (LOPRESSOR) tablet 50 mg  50 mg Oral BID Rod Can, MD   50 mg at 01/13/20 2226  . morphine 2 MG/ML injection 1 mg  1 mg Intravenous Q2H PRN Rod Can, MD   1 mg at 01/13/20 0925  . ondansetron (ZOFRAN) tablet 4 mg  4 mg Oral Q6H PRN Swinteck, Aaron Edelman, MD       Or  . ondansetron (ZOFRAN) injection 4 mg  4 mg Intravenous Q6H PRN Swinteck, Aaron Edelman, MD      . polyethylene glycol (MIRALAX / GLYCOLAX) packet 17 g  17 g Oral Daily Georgette Shell, MD   17 g at 01/14/20 1030     Discharge Medications: Please see discharge summary for a list of discharge medications.  Relevant Imaging Results:  Relevant Lab Results:   Additional Information SS# [id number]  Lennart Pall, LCSW

## 2020-01-15 LAB — CBC
HCT: 31.1 % — ABNORMAL LOW (ref 36.0–46.0)
Hemoglobin: 9.1 g/dL — ABNORMAL LOW (ref 12.0–15.0)
MCH: 28.5 pg (ref 26.0–34.0)
MCHC: 29.3 g/dL — ABNORMAL LOW (ref 30.0–36.0)
MCV: 97.5 fL (ref 80.0–100.0)
Platelets: 177 10*3/uL (ref 150–400)
RBC: 3.19 MIL/uL — ABNORMAL LOW (ref 3.87–5.11)
RDW: 18.2 % — ABNORMAL HIGH (ref 11.5–15.5)
WBC: 12.4 10*3/uL — ABNORMAL HIGH (ref 4.0–10.5)
nRBC: 0 % (ref 0.0–0.2)

## 2020-01-15 LAB — BASIC METABOLIC PANEL
Anion gap: 6 (ref 5–15)
BUN: 36 mg/dL — ABNORMAL HIGH (ref 8–23)
CO2: 27 mmol/L (ref 22–32)
Calcium: 9.1 mg/dL (ref 8.9–10.3)
Chloride: 104 mmol/L (ref 98–111)
Creatinine, Ser: 1.72 mg/dL — ABNORMAL HIGH (ref 0.44–1.00)
GFR calc Af Amer: 29 mL/min — ABNORMAL LOW (ref >60)
GFR calc non Af Amer: 25 mL/min — ABNORMAL LOW (ref >60)
Glucose, Bld: 119 mg/dL — ABNORMAL HIGH (ref 70–99)
Potassium: 5.2 mmol/L — ABNORMAL HIGH (ref 3.5–5.1)
Sodium: 137 mmol/L (ref 135–145)

## 2020-01-15 MED ORDER — SODIUM CHLORIDE 0.9 % IV SOLN
2.0000 g | Freq: Two times a day (BID) | INTRAVENOUS | Status: DC
  Filled 2020-01-15: qty 2

## 2020-01-15 MED ORDER — SODIUM CHLORIDE 0.9 % IV SOLN
2.0000 g | INTRAVENOUS | Status: DC
  Administered 2020-01-15 – 2020-01-17 (×3): 2 g via INTRAVENOUS
  Filled 2020-01-15 (×3): qty 2

## 2020-01-15 NOTE — Progress Notes (Signed)
PROGRESS NOTE    Shelly Obrien  LAG:[id number] DOB: 1923-03-26 DOA: 01/12/2020 PCP: [name], MD    Brief Narrative: Shelly Obrien is a 86 y.o. female with history of CAD status post stenting, A. fib, chronic kidney disease stage III, B12 deficiency presents to the ER the patient had a fall at home.  Patient states she was bending down to pick something when she lost balance and fell onto the floor hit her head did not lose consciousness.  Denies any chest pain or shortness of breath.  She took about 2 hours to gain his strength and to reach the phone and called her neighbor who in turn called the EMS and was brought to the ER.  ED Course: X-rays in the ER showed right hip fracture.  Dr. Alvan Dame orthopedic surgeon was notified.  Patient's last dose of Eliquis was yesterday morning.  EKG shows A. fib rate controlled labs are creatinine 1.9 Covid test negative CT head and C-spine unremarkable.  Assessment & Plan:   Active Problems:   Essential hypertension   CAD (coronary artery disease)   CKD (chronic kidney disease) stage 3, GFR 30-59 ml/min (HCC)   PAF (paroxysmal atrial fibrillation) (HCC)   DNR (do not resuscitate)   MGUS (monoclonal gammopathy of unknown significance)   Hip fracture (HCC)   Displaced fracture of right femoral neck ([place])   Fall   Surgery, elective   #1 right hip fracture status post mechanical fall status post intramedullary fixation 01/13/2020 Continue eliquis   stool softeners Pain control  #2 chronic atrial fibrillation -rate controlled on metoprolol and amiodarone. Continue Eliquis.    #3 acute on chronic CKD stage IIIb -creatine 1.7 from 1.49 follow-up renal functions in a.m.   #4 history of systolic congestive heart failure echocardiogram in February 2021 with ejection fraction 25 to 30%.  She is on Lasix 80 mg daily at home will need to restart this at some point.  #5 history of CAD and stent on beta-blocker amiodarone and Eliquis  #6 history of B12  deficiency continue replacement  #7  Acquired thrombophilia/chronic atrial fibrillation patient on Eliquis with high risk for blood clots.  #8 Hyperkalemia-lokelma  #9 Leukocytosis-worsening.chest xray atelectasis and pleural effusion,patient hypoxic and sob,not on oxygen prior to admit.start aztreonam allergic to cefepime.mrsa pcr negative  Nutrition Problem: Increased nutrient needs Etiology: post-op healing     Signs/Symptoms: estimated needs    Interventions: Ensure Enlive (each supplement provides 350kcal and 20 grams of protein)  Estimated body mass index is 27.88 kg/m as calculated from the following:   Height as of this encounter: 5\' 5"  (1.651 m).   Weight as of this encounter: 76 kg.  DVT prophylaxis: SCD/Eliquis Code Status: DNR Family Communication: None at bedside  disposition Plan:  Status is: Inpatient  Dispo: The patient is from:snf              Anticipated d/c is to:snf              Anticipated d/c date is unknown              Patient currently is not medically stable to d/c.new hypoxia leukocytosis worsening renal functions     Consultants: Ortho Procedures: Hip surgery 01/13/2020 Antimicrobials: None  Subjective: She is awake alert  C/o sob  Objective: Vitals:   01/14/20 1903 01/14/20 2145 01/15/20 0136 01/15/20 0542  BP: 130/81 118/75 114/80 134/90  Pulse: 88 80 77 71  Resp:  17 16 16   Temp:  97.8 F (36.6 C) 98 F (36.7 C) 98.2 F (36.8 C)  TempSrc:  Oral    SpO2: 100% 100% 100% 100%  Weight:      Height:        Intake/Output Summary (Last 24 hours) at 01/15/2020 1047 Last data filed at 01/15/2020 7510 Gross per 24 hour  Intake 1079.77 ml  Output 200 ml  Net 879.77 ml   Filed Weights   01/12/20 2242  Weight: 76 kg    Examination:  General exam: Appears calm and comfortable  Respiratory system: diminished at the bases to auscultation. Respiratory effort normal. Cardiovascular system: S1 & S2 heard, RRR. No JVD, murmurs, rubs,  gallops or clicks. No pedal edema. Gastrointestinal system: Abdomen is nondistended, soft and nontender. No organomegaly or masses felt. Normal bowel sounds heard. Central nervous system: Alert and oriented. No focal neurological deficits. Extremities:trace edema incision site covered with dressing. Skin: No rashes, lesions or ulcers Psychiatry: Judgement and insight appear normal. Mood & affect appropriate.     Data Reviewed: I have personally reviewed following labs and imaging studies  CBC: Recent Labs  Lab 01/12/20 0241 01/14/20 0302 01/15/20 0329  WBC 10.3 10.9* 12.4*  HGB 12.1 10.0* 9.1*  HCT 41.3 33.9* 31.1*  MCV 95.2 98.3 97.5  PLT 208 175 258   Basic Metabolic Panel: Recent Labs  Lab 01/12/20 0241 01/14/20 0302 01/15/20 0329  NA 144 138 137  K 4.1 4.7 5.2*  CL 103 103 104  CO2 28 28 27   GLUCOSE 147* 185* 119*  BUN 21 28* 36*  CREATININE 1.91* 1.49* 1.72*  CALCIUM 9.6 8.9 9.1   GFR: Estimated Creatinine Clearance: 20 mL/min (A) (by C-G formula based on SCr of 1.72 mg/dL (H)). Liver Function Tests: No results for input(s): AST, ALT, ALKPHOS, BILITOT, PROT, ALBUMIN in the last 168 hours. No results for input(s): LIPASE, AMYLASE in the last 168 hours. No results for input(s): AMMONIA in the last 168 hours. Coagulation Profile: Recent Labs  Lab 01/12/20 0241  INR 1.3*   Cardiac Enzymes: No results for input(s): CKTOTAL, CKMB, CKMBINDEX, TROPONINI in the last 168 hours. BNP (last 3 results) No results for input(s): PROBNP in the last 8760 hours. HbA1C: No results for input(s): HGBA1C in the last 72 hours. CBG: No results for input(s): GLUCAP in the last 168 hours. Lipid Profile: No results for input(s): CHOL, HDL, LDLCALC, TRIG, CHOLHDL, LDLDIRECT in the last 72 hours. Thyroid Function Tests: No results for input(s): TSH, T4TOTAL, FREET4, T3FREE, THYROIDAB in the last 72 hours. Anemia Panel: No results for input(s): VITAMINB12, FOLATE, FERRITIN, TIBC,  IRON, RETICCTPCT in the last 72 hours. Sepsis Labs: No results for input(s): PROCALCITON, LATICACIDVEN in the last 168 hours.  Recent Results (from the past 240 hour(s))  SARS Coronavirus 2 by RT PCR (hospital order, performed in [facility] hospital lab) Nasopharyngeal Nasopharyngeal Swab     Status: None   Collection Time: 01/12/20  2:28 AM   Specimen: Nasopharyngeal Swab  Result Value Ref Range Status   SARS Coronavirus 2 NEGATIVE NEGATIVE Final    Comment: (NOTE) SARS-CoV-2 target nucleic acids are NOT DETECTED.  The SARS-CoV-2 RNA is generally detectable in upper and lower respiratory specimens during the acute phase of infection. The lowest concentration of SARS-CoV-2 viral copies this assay can detect is 250 copies / mL. A negative result does not preclude SARS-CoV-2 infection and should not be used as the sole basis for treatment or other patient management decisions.  A negative result  may occur with improper specimen collection / handling, submission of specimen other than nasopharyngeal swab, presence of viral mutation(s) within the areas targeted by this assay, and inadequate number of viral copies (<250 copies / mL). A negative result must be combined with clinical observations, patient history, and epidemiological information.  Fact Sheet for Patients:   StrictlyIdeas.no  Fact Sheet for Healthcare Providers: BankingDealers.co.za  This test is not yet approved or  cleared by the [place] FDA and has been authorized for detection and/or diagnosis of SARS-CoV-2 by FDA under an Emergency Use Authorization (EUA).  This EUA will remain in effect (meaning this test can be used) for the duration of the COVID-19 declaration under Section 564(b)(1) of the Act, 21 U.S.C. section 360bbb-3(b)(1), unless the authorization is terminated or revoked sooner.  Performed at [facility], [place] [address], [place], [place]  [address]   Surgical PCR screen     Status: None   Collection Time: 01/13/20 12:56 AM   Specimen: Nasal Mucosa; Nasal Swab  Result Value Ref Range Status   MRSA, PCR NEGATIVE NEGATIVE Final   Staphylococcus aureus NEGATIVE NEGATIVE Final    Comment: (NOTE) The Xpert SA Assay (FDA approved for NASAL specimens in patients 60 years of age and older), is one component of a comprehensive surveillance program. It is not intended to diagnose infection nor to guide or monitor treatment. Performed at [facility], [place] [address] [place], [place] [address]          Radiology Studies: DG Chest 1 View  Result Date: 01/14/2020 CLINICAL DATA:  Onset shortness of breath today. EXAM: CHEST  1 VIEW COMPARISON:  Single-view of the chest 01/12/2020. FINDINGS: There is cardiomegaly. The patient has small bilateral pleural effusions and basilar airspace disease, greater on the right. No evidence of pulmonary edema. No pneumothorax. No acute bony abnormality. IMPRESSION: Small bilateral pleural effusions and hazy airspace disease, worse on the right. Airspace disease is likely due to atelectasis. Cardiomegaly without edema. Electronically Signed   By: Inge Rise M.D.   On: 01/14/2020 14:17   Pelvis Portable  Result Date: 01/13/2020 CLINICAL DATA:  Postop hip surgery. EXAM: PORTABLE PELVIS 1-2 VIEWS COMPARISON:  01/12/2020 FINDINGS: Fracture of the right lower femoral neck is been fixed with a threaded screw and locking intramedullary rod. Fracture alignment satisfactory. Hardware in good position. Right hip joint space normal. No other fracture. IMPRESSION: Satisfactory fixation of right femoral neck fracture. Electronically Signed   By: Franchot Gallo M.D.   On: 01/13/2020 16:11   DG C-Arm 1-60 Min-No Report  Result Date: 01/13/2020 Fluoroscopy was utilized by the requesting physician.  No radiographic interpretation.   DG FEMUR, MIN 2 VIEWS RIGHT  Result Date: 01/13/2020  CLINICAL DATA:  Right femoral IM nail EXAM: RIGHT FEMUR 2 VIEWS COMPARISON:  01/12/2020 FINDINGS: Internal fixation across the right femoral intertrochanteric fracture. Anatomic alignment. No hardware bony complicating feature. IMPRESSION: Internal fixation.  No visible complicating feature. Electronically Signed   By: Rolm Baptise M.D.   On: 01/13/2020 15:29        Scheduled Meds: . amiodarone  200 mg Oral BID  . apixaban  2.5 mg Oral BID  . Chlorhexidine Gluconate Cloth  6 each Topical Daily  . docusate sodium  200 mg Oral BID  . feeding supplement (ENSURE ENLIVE)  237 mL Oral Q24H  . polyethylene glycol  17 g Oral Daily   Continuous Infusions: . sodium chloride Stopped (01/14/20 1840)  . aztreonam  LOS: 3 days     Georgette Shell, MD 01/15/2020, 10:47 AM

## 2020-01-15 NOTE — TOC Progression Note (Signed)
Transition of Care [facility]) - Progression Note    Patient Details  Name: Shelly Obrien MRN: [id number] Date of Birth: Oct 02, 1922  [facility]) CM/SW Contact  Lennart Pall, LCSW Phone Number: 01/15/2020, 3:01 PM  Clinical Narrative:     Pt and son are aware that MD does not feel pt is medically ready for SNF transfer today. They have accepted a bed at [facility] and the facility can admit over weekend if clearance is given.  Will leave information for weekend TOC coverage.  Expected Discharge Plan: [place] Barriers to Discharge: Continued Medical Work up  Expected Discharge Plan and Services Expected Discharge Plan: [place] In-house Referral: Clinical Social Work   Post Acute Care Choice: [place] Living arrangements for the past 2 months: Single Family Home                 DME Arranged: N/A DME Agency: NA       HH Arranged: NA HH Agency: NA         Social Determinants of Health (SDOH) Interventions    Readmission Risk Interventions Readmission Risk Prevention Plan 01/14/2020  Transportation Screening Complete  PCP or Specialist Appt within 5-7 Days Complete  Home Care Screening Complete  Medication Review (RN CM) Complete  Some recent data might be hidden

## 2020-01-15 NOTE — Care Management Important Message (Signed)
Important Message  Patient Details IM Letter given to the Patient Name: Shelly Obrien MRN: [id number] Date of Birth: December 17, 1922   Medicare Important Message Given:  Yes     Kerin Salen 01/15/2020, 8:57 AM

## 2020-01-15 NOTE — Plan of Care (Signed)
  Problem: Nutrition: Goal: Adequate nutrition will be maintained Outcome: Progressing   Problem: Coping: Goal: Level of anxiety will decrease Outcome: Progressing   Problem: Pain Managment: Goal: General experience of comfort will improve Outcome: Progressing   

## 2020-01-15 NOTE — Progress Notes (Signed)
Pharmacy Antibiotic Note  Shelly Obrien is a 86 y.o. female admitted on 01/12/2020 with pneumonia.  Pharmacy has been consulted for aztreonam dosing.  Pt admitted with right hip fracture s/p fall and underwent surgery on 8/11. Antibiotics now being prescribed for PNA. Pt has PCN allergy.  Today, 01/15/20  WBC 12.4  SCr 1.72, CrCl ~20 mL/min  Afebrile  Plan:  Aztreonam 2 g IV q12h  Monitor renal function for necessary dose adjustments  Height: 5\' 5"  (165.1 cm) Weight: 76 kg (167 lb 8.8 oz) IBW/kg (Calculated) : 57  Temp (24hrs), Avg:97.9 F (36.6 C), Min:97.5 F (36.4 C), Max:98.2 F (36.8 C)  Recent Labs  Lab 01/12/20 0241 01/14/20 0302 01/15/20 0329  WBC 10.3 10.9* 12.4*  CREATININE 1.91* 1.49* 1.72*    Estimated Creatinine Clearance: 20 mL/min (A) (by C-G formula based on SCr of 1.72 mg/dL (H)).    Allergies  Allergen Reactions  . Antihistamines, Chlorpheniramine-Type Other (See Comments)    Pt states that she passed out.   [name] Penicillins Anaphylaxis and Other (See Comments)    Has patient had a PCN reaction causing immediate rash, facial/tongue/throat swelling, SOB or lightheadedness with hypotension: Yes Has patient had a PCN reaction causing severe rash involving mucus membranes or skin necrosis: No Has patient had a PCN reaction that required hospitalization No Has patient had a PCN reaction occurring within the last 10 years: No If all of the above answers are "NO", then may proceed with Cephalosporin use.  . Contrast Media [Iodinated Diagnostic Agents] Hives  . Sulfonamide Derivatives Other (See Comments)    Reaction:  Unknown   . Atorvastatin Other (See Comments)    myalgias  . Pheniramine Other (See Comments)    Pt states that she passed out.    Antimicrobials this admission: aztreonam 8/13 >>   Dose adjustments this admission:  Microbiology results:  Thank you for allowing pharmacy to be a part of this patient's care.  Lenis Noon,  PharmD 01/15/2020 10:13 AM

## 2020-01-16 LAB — CBC
HCT: 31.3 % — ABNORMAL LOW (ref 36.0–46.0)
Hemoglobin: 9.4 g/dL — ABNORMAL LOW (ref 12.0–15.0)
MCH: 28.9 pg (ref 26.0–34.0)
MCHC: 30 g/dL (ref 30.0–36.0)
MCV: 96.3 fL (ref 80.0–100.0)
Platelets: 181 10*3/uL (ref 150–400)
RBC: 3.25 MIL/uL — ABNORMAL LOW (ref 3.87–5.11)
RDW: 18.6 % — ABNORMAL HIGH (ref 11.5–15.5)
WBC: 8.5 10*3/uL (ref 4.0–10.5)
nRBC: 0 % (ref 0.0–0.2)

## 2020-01-16 LAB — BASIC METABOLIC PANEL
Anion gap: 5 (ref 5–15)
BUN: 33 mg/dL — ABNORMAL HIGH (ref 8–23)
CO2: 27 mmol/L (ref 22–32)
Calcium: 8.4 mg/dL — ABNORMAL LOW (ref 8.9–10.3)
Chloride: 101 mmol/L (ref 98–111)
Creatinine, Ser: 1.4 mg/dL — ABNORMAL HIGH (ref 0.44–1.00)
GFR calc Af Amer: 37 mL/min — ABNORMAL LOW (ref >60)
GFR calc non Af Amer: 32 mL/min — ABNORMAL LOW (ref >60)
Glucose, Bld: 115 mg/dL — ABNORMAL HIGH (ref 70–99)
Potassium: 5 mmol/L (ref 3.5–5.1)
Sodium: 133 mmol/L — ABNORMAL LOW (ref 135–145)

## 2020-01-16 MED ORDER — FUROSEMIDE 20 MG PO TABS
20.0000 mg | ORAL_TABLET | Freq: Every day | ORAL | 2 refills | Status: DC
Start: 2020-01-16 — End: 2020-04-22

## 2020-01-16 MED ORDER — AZITHROMYCIN 500 MG PO TABS
500.0000 mg | ORAL_TABLET | Freq: Every day | ORAL | 0 refills | Status: AC
Start: 2020-01-16 — End: 2020-01-21

## 2020-01-16 NOTE — Discharge Summary (Addendum)
Physician Discharge Summary  Shelly Obrien NFA:[id number] DOB: [birth year] DOA: 01/12/2020  PCP: [name], MD  Admit date: 01/12/2020 Discharge date: 01/17/20  Admitted From:snf Disposition: snf Recommendations for Outpatient Follow-up:  1. Follow up with PCP in 1-2 weeks 2. Please obtain BMP/CBC in one week 3. Please note that I have stopped metoprolol due to normal soft blood pressure may restart as needed 4. I have decreased her Lasix to 20 mg daily because of AKI and soft blood pressure increase the dose as needed  Home Health none Equipment/Devices: 2 L of oxygen taper to keep her sats above 90%  Discharge Condition stable CODE STATUS DO NOT RESUSCITATE Diet recommendation cardiac diet Brief/Interim Summary: Shelly Obrien a 86 y.o.femaleewithhistory of CAD status post stenting, A. fib, chronic kidney disease stage III, B12 deficiency presents to the ER the patient had a fall at home. Patient states she was bending down to pick something when she lost balance and fell onto the floor hit her head did not lose consciousness. Denies any chest pain or shortness of breath. She took about 2 hours to gain his strength and to reach the phone and called her neighbor who in turn called the EMS and was brought to the ER. Discharge Diagnoses:  Active Problems:   Essential hypertension   CAD (coronary artery disease)   CKD (chronic kidney disease) stage 3, GFR 30-59 ml/min (HCC)   PAF (paroxysmal atrial fibrillation) (HCC)   DNR (do not resuscitate)   MGUS (monoclonal gammopathy of unknown significance)   Hip fracture (HCC)   Displaced fracture of right femoral neck ([place])   Fall   Surgery, elective     #1 right hip fracture status post mechanical fall status post intramedullary fixation 01/13/2020 Continue eliquis   stool softeners  Pain control with tylenol  #2 chronic atrial fibrillation -rate controlled on amiodarone. Continue Eliquis.  metoprolol stopped due to soft  bp.restart as needed  #3 acute on chronic CKD stage IIIb -creatine 1.7 from 1.49 follow-up renal functions in one week  #4 history of systolic congestive heart failure echocardiogram in February 2021 with ejection fraction 25 to 30%.  She is on Lasix 80 mg daily at home will need to restart this at some point.  #5 history of CAD and stent on  amiodarone and Eliquis  #6 history of B12 deficiency continue replacement  #7  Acquired thrombophilia/chronic atrial fibrillation patient on Eliquis with high risk for blood clots.  #8 Hyperkalemia-resolved  #9 Leukocytosis-resolved .chest xray atelectasis and pleural effusion  #10 Bronchitis/atelectasis patient was short of breath requiring oxygen.  She received cefepime for 1 day.  Leukocytosis improved breathing got better I will discharge her on Zithromax for 5 days.  She has severe anaphylaxis reaction to penicillin.    Nutrition Problem: Increased nutrient needs Etiology: post-op healing    Signs/Symptoms: estimated needs     Interventions: Ensure Enlive (each supplement provides 350kcal and 20 grams of protein)  Estimated body mass index is 27.88 kg/m as calculated from the following:   Height as of this encounter: 5\' 5"  (1.651 m).   Weight as of this encounter: 76 kg.  Discharge Instructions  Discharge Instructions    Diet - low sodium heart healthy   Complete by: As directed    Increase activity slowly   Complete by: As directed    Leave dressing on - Keep it clean, dry, and intact until clinic visit   Complete by: As directed  Allergies as of 01/16/2020      Reactions   Antihistamines, Chlorpheniramine-type Other (See Comments)   Pt states that she passed out.    Penicillins Anaphylaxis, Other (See Comments)   Has patient had a PCN reaction causing immediate rash, facial/tongue/throat swelling, SOB or lightheadedness with hypotension: Yes Has patient had a PCN reaction causing severe rash involving mucus  membranes or skin necrosis: No Has patient had a PCN reaction that required hospitalization No Has patient had a PCN reaction occurring within the last 10 years: No If all of the above answers are "NO", then may proceed with Cephalosporin use.   Contrast Media [iodinated Diagnostic Agents] Hives   Sulfonamide Derivatives Other (See Comments)   Reaction:  Unknown    Atorvastatin Other (See Comments)   myalgias   Pheniramine Other (See Comments)   Pt states that she passed out.       Medication List    STOP taking these medications   metoprolol tartrate 50 MG tablet Commonly known as: LOPRESSOR   Probiotic Acidophilus Tabs     TAKE these medications   amiodarone 200 MG tablet Commonly known as: PACERONE Take 200 mg by mouth 2 (two) times daily.   apixaban 2.5 MG Tabs tablet Commonly known as: Eliquis Take 1 tablet (2.5 mg total) by mouth 2 (two) times daily. What changed: Another medication with the same name was removed. Continue taking this medication, and follow the directions you see here.   azithromycin 500 MG tablet Commonly known as: Zithromax Take 1 tablet (500 mg total) by mouth daily for 5 days. Take 1 tablet daily for 3 days.   fluocinonide 0.05 % external solution Commonly known as: LIDEX Apply 1 application topically as needed (itching).   furosemide 20 MG tablet Commonly known as: Lasix Take 1 tablet (20 mg total) by mouth daily. What changed:   medication strength  how much to take   HYDROcodone-acetaminophen 5-325 MG tablet Commonly known as: NORCO/VICODIN Take 1 tablet by mouth every 4 (four) hours as needed for up to 7 days for moderate pain or severe pain.   ICaps Caps Take 1 capsule by mouth 2 (two) times daily.   NOSE DROPS NA Place 1 drop into the nose daily as needed (alleriges).   THERA TEARS NUTRITION PO Take 1 drop by mouth daily as needed (watery eyes).   triamcinolone 0.025 % cream Commonly known as: KENALOG Apply 1 application  topically as needed (itching).            Discharge Care Instructions  (From admission, onward)         Start     Ordered   01/16/20 0000  Leave dressing on - Keep it clean, dry, and intact until clinic visit        01/16/20 0853          Contact information for follow-up providers    Swinteck, Aaron Edelman, MD. Schedule an appointment as soon as possible for a visit in 2 weeks.   Specialty: Orthopedic Surgery Why: For wound re-check Contact information: [address] [place] [address] [phone]            Contact information for after-discharge care    Destination    [facility] Preferred SNF .   Service: Skilled Nursing Contact information: Tracy McGregor [phone]                 Allergies  Allergen Reactions  .  Antihistamines, Chlorpheniramine-Type Other (See Comments)    Pt states that she passed out.   [name] Penicillins Anaphylaxis and Other (See Comments)    Has patient had a PCN reaction causing immediate rash, facial/tongue/throat swelling, SOB or lightheadedness with hypotension: Yes Has patient had a PCN reaction causing severe rash involving mucus membranes or skin necrosis: No Has patient had a PCN reaction that required hospitalization No Has patient had a PCN reaction occurring within the last 10 years: No If all of the above answers are "NO", then may proceed with Cephalosporin use.  . Contrast Media [Iodinated Diagnostic Agents] Hives  . Sulfonamide Derivatives Other (See Comments)    Reaction:  Unknown   . Atorvastatin Other (See Comments)    myalgias  . Pheniramine Other (See Comments)    Pt states that she passed out.     Consultations: ortho Procedures/Studies: DG Chest 1 View  Result Date: 01/14/2020 CLINICAL DATA:  Onset shortness of breath today. EXAM: CHEST  1 VIEW COMPARISON:  Single-view of the chest 01/12/2020. FINDINGS: There is cardiomegaly.  The patient has small bilateral pleural effusions and basilar airspace disease, greater on the right. No evidence of pulmonary edema. No pneumothorax. No acute bony abnormality. IMPRESSION: Small bilateral pleural effusions and hazy airspace disease, worse on the right. Airspace disease is likely due to atelectasis. Cardiomegaly without edema. Electronically Signed   By: Inge Rise M.D.   On: 01/14/2020 14:17   CT Head Wo Contrast  Result Date: 01/12/2020 CLINICAL DATA:  86 year old female status post fall. On blood thinners. EXAM: CT HEAD WITHOUT CONTRAST TECHNIQUE: Contiguous axial images were obtained from the base of the skull through the vertex without intravenous contrast. COMPARISON:  Brain MRI 01/26/2017. FINDINGS: Brain: Stable cerebral volume since 2018. No midline shift, mass effect, or evidence of intracranial mass lesion. No ventriculomegaly. No acute intracranial hemorrhage identified. No cortically based acute infarct identified. Stable mild for age white matter changes. Vascular: Calcified atherosclerosis at the skull base. Skull: No skull fracture identified. Sinuses/Orbits: Chronic right maxillary sinusitis with periosteal thickening and partially calcified retained secretions. Chronic left maxillary mucous retention cysts. Other Visualized paranasal sinuses and mastoids are clear. Other: No acute orbit or scalp soft tissue injury identified. IMPRESSION: 1. No acute intracranial abnormality or acute traumatic injury identified. 2. Chronic right maxillary sinusitis. Electronically Signed   By: Genevie Ann M.D.   On: 01/12/2020 02:16   CT Cervical Spine Wo Contrast  Result Date: 01/12/2020 CLINICAL DATA:  86 year old female status post fall. On blood thinners. EXAM: CT CERVICAL SPINE WITHOUT CONTRAST TECHNIQUE: Multidetector CT imaging of the cervical spine was performed without intravenous contrast. Multiplanar CT image reconstructions were also generated. COMPARISON:  Head CT today  reported separately. FINDINGS: Alignment: Mildly exaggerated cervical lordosis. Cervicothoracic junction alignment is within normal limits. Bilateral posterior element alignment is within normal limits. Skull base and vertebrae: Visualized skull base is intact. No atlanto-occipital dissociation. No acute osseous abnormality identified. Soft tissues and spinal canal: No prevertebral fluid or swelling. No visible canal hematoma. Negative visible noncontrast neck soft tissues aside from calcified carotid atherosclerosis. Disc levels: C2-C3 ankylosis. Mild for age superimposed cervical spine degeneration. Upper chest: Visible upper thoracic levels appear grossly intact. Mild apical lung scarring. IMPRESSION: 1. No acute traumatic injury identified in the cervical spine. 2. C2-C3 ankylosis with mild for age cervical spine degeneration elsewhere. Electronically Signed   By: Genevie Ann M.D.   On: 01/12/2020 02:19   Pelvis Portable  Result Date: 01/13/2020 CLINICAL  DATA:  Postop hip surgery. EXAM: PORTABLE PELVIS 1-2 VIEWS COMPARISON:  01/12/2020 FINDINGS: Fracture of the right lower femoral neck is been fixed with a threaded screw and locking intramedullary rod. Fracture alignment satisfactory. Hardware in good position. Right hip joint space normal. No other fracture. IMPRESSION: Satisfactory fixation of right femoral neck fracture. Electronically Signed   By: Franchot Gallo M.D.   On: 01/13/2020 16:11   DG Chest Port 1 View  Result Date: 01/12/2020 CLINICAL DATA:  Fall EXAM: PORTABLE CHEST 1 VIEW COMPARISON:  Oct 29, 2019 FINDINGS: There is mild cardiomegaly. There is mildly increased interstitial markings seen at both lung bases as on the prior exam, likely chronic lung changes. Aortic knob calcifications are noted. Advanced left shoulder osteoarthritis is seen. IMPRESSION: No active disease. Electronically Signed   By: Prudencio Pair M.D.   On: 01/12/2020 02:54   DG Knee Right Port  Result Date:  01/12/2020 CLINICAL DATA:  Right hip fracture.  Pain. EXAM: PORTABLE RIGHT KNEE - 1-2 VIEW COMPARISON:  10/29/2019 FINDINGS: No acute fracture. No malalignment. Trace knee joint effusion. Moderate medial compartment osteoarthritis. Bones are demineralized. Soft tissues unremarkable. IMPRESSION: 1. No acute osseous abnormality, right knee. 2. Moderate medial compartment osteoarthritis. Electronically Signed   By: Davina Poke D.O.   On: 01/12/2020 12:45   DG C-Arm 1-60 Min-No Report  Result Date: 01/13/2020 Fluoroscopy was utilized by the requesting physician.  No radiographic interpretation.   DG Hip Unilat W or Wo Pelvis 2-3 Views Left  Result Date: 01/12/2020 CLINICAL DATA:  Fall and pain EXAM: DG HIP (WITH OR WITHOUT PELVIS) 2-3V LEFT COMPARISON:  None. FINDINGS: There is a nondisplaced right intratrochanteric femoral fracture. The femoral head is still well seated within the acetabulum. No other definite fracture is identified. Overlying soft tissue swelling is seen. Calcified probable uterine fibroid in the deep pelvis. IMPRESSION: Nondisplaced right intratrochanteric fracture Electronically Signed   By: Prudencio Pair M.D.   On: 01/12/2020 02:11   DG FEMUR, MIN 2 VIEWS RIGHT  Result Date: 01/13/2020 CLINICAL DATA:  Right femoral IM nail EXAM: RIGHT FEMUR 2 VIEWS COMPARISON:  01/12/2020 FINDINGS: Internal fixation across the right femoral intertrochanteric fracture. Anatomic alignment. No hardware bony complicating feature. IMPRESSION: Internal fixation.  No visible complicating feature. Electronically Signed   By: Rolm Baptise M.D.   On: 01/13/2020 15:29    (Echo, Carotid, EGD, Colonoscopy, ERCP)    Subjective:  Resting in bed in no acute distress denies any new complaints today no shortness of breath cough or chest pain Discharge Exam: Vitals:   01/15/20 2046 01/16/20 0607  BP: 114/78 127/86  Pulse: 93 84  Resp: 14 15  Temp: 98.7 F (37.1 C) 98.2 F (36.8 C)  SpO2: 98% 98%    Vitals:   01/15/20 0136 01/15/20 0542 01/15/20 2046 01/16/20 0607  BP: 114/80 134/90 114/78 127/86  Pulse: 77 71 93 84  Resp: 16 16 14 15   Temp: 98 F (36.7 C) 98.2 F (36.8 C) 98.7 F (37.1 C) 98.2 F (36.8 C)  TempSrc:   Oral Oral  SpO2: 100% 100% 98% 98%  Weight:      Height:        General: Pt is alert, awake, not in acute distress Cardiovascular: RRR, S1/S2 +, no rubs, no gallops Respiratory: CTA bilaterally, no wheezing, no rhonchi Abdominal: Soft, NT, ND, bowel sounds + Extremities: no edema, no cyanosis    The results of significant diagnostics from this hospitalization (including imaging, microbiology, ancillary and  laboratory) are listed below for reference.     Microbiology: Recent Results (from the past 240 hour(s))  SARS Coronavirus 2 by RT PCR (hospital order, performed in [facility] hospital lab) Nasopharyngeal Nasopharyngeal Swab     Status: None   Collection Time: 01/12/20  2:28 AM   Specimen: Nasopharyngeal Swab  Result Value Ref Range Status   SARS Coronavirus 2 NEGATIVE NEGATIVE Final    Comment: (NOTE) SARS-CoV-2 target nucleic acids are NOT DETECTED.  The SARS-CoV-2 RNA is generally detectable in upper and lower respiratory specimens during the acute phase of infection. The lowest concentration of SARS-CoV-2 viral copies this assay can detect is 250 copies / mL. A negative result does not preclude SARS-CoV-2 infection and should not be used as the sole basis for treatment or other patient management decisions.  A negative result may occur with improper specimen collection / handling, submission of specimen other than nasopharyngeal swab, presence of viral mutation(s) within the areas targeted by this assay, and inadequate number of viral copies (<250 copies / mL). A negative result must be combined with clinical observations, patient history, and epidemiological information.  Fact Sheet for Patients:    StrictlyIdeas.no  Fact Sheet for Healthcare Providers: BankingDealers.co.za  This test is not yet approved or  cleared by the [place] FDA and has been authorized for detection and/or diagnosis of SARS-CoV-2 by FDA under an Emergency Use Authorization (EUA).  This EUA will remain in effect (meaning this test can be used) for the duration of the COVID-19 declaration under Section 564(b)(1) of the Act, 21 U.S.C. section 360bbb-3(b)(1), unless the authorization is terminated or revoked sooner.  Performed at [facility], [place] [address], [place], [place] [address]   Surgical PCR screen     Status: None   Collection Time: 01/13/20 12:56 AM   Specimen: Nasal Mucosa; Nasal Swab  Result Value Ref Range Status   MRSA, PCR NEGATIVE NEGATIVE Final   Staphylococcus aureus NEGATIVE NEGATIVE Final    Comment: (NOTE) The Xpert SA Assay (FDA approved for NASAL specimens in patients 13 years of age and older), is one component of a comprehensive surveillance program. It is not intended to diagnose infection nor to guide or monitor treatment. Performed at [facility], [place] [address] [place], [place] [address]      Labs: BNP (last 3 results) Recent Labs    07/12/19 0929 07/17/19 1225  BNP 651.1* 062.3*   Basic Metabolic Panel: Recent Labs  Lab 01/12/20 0241 01/14/20 0302 01/15/20 0329 01/16/20 0256  NA 144 138 137 133*  K 4.1 4.7 5.2* 5.0  CL 103 103 104 101  CO2 28 28 27 27   GLUCOSE 147* 185* 119* 115*  BUN 21 28* 36* 33*  CREATININE 1.91* 1.49* 1.72* 1.40*  CALCIUM 9.6 8.9 9.1 8.4*   Liver Function Tests: No results for input(s): AST, ALT, ALKPHOS, BILITOT, PROT, ALBUMIN in the last 168 hours. No results for input(s): LIPASE, AMYLASE in the last 168 hours. No results for input(s): AMMONIA in the last 168 hours. CBC: Recent Labs  Lab 01/12/20 0241 01/14/20 0302 01/15/20 0329 01/16/20 0256   WBC 10.3 10.9* 12.4* 8.5  HGB 12.1 10.0* 9.1* 9.4*  HCT 41.3 33.9* 31.1* 31.3*  MCV 95.2 98.3 97.5 96.3  PLT 208 175 177 181   Cardiac Enzymes: No results for input(s): CKTOTAL, CKMB, CKMBINDEX, TROPONINI in the last 168 hours. BNP: Invalid input(s): POCBNP CBG: No results for input(s): GLUCAP in the last 168  hours. D-Dimer No results for input(s): DDIMER in the last 72 hours. Hgb A1c No results for input(s): HGBA1C in the last 72 hours. Lipid Profile No results for input(s): CHOL, HDL, LDLCALC, TRIG, CHOLHDL, LDLDIRECT in the last 72 hours. Thyroid function studies No results for input(s): TSH, T4TOTAL, T3FREE, THYROIDAB in the last 72 hours.  Invalid input(s): FREET3 Anemia work up No results for input(s): VITAMINB12, FOLATE, FERRITIN, TIBC, IRON, RETICCTPCT in the last 72 hours. Urinalysis    Component Value Date/Time   COLORURINE YELLOW 12/14/2017 [place] 12/14/2017 0757   LABSPEC 1.020 04/25/2018 0925   PHURINE 6.5 04/25/2018 0925   GLUCOSEU NEGATIVE 04/25/2018 0925   HGBUR NEGATIVE 04/25/2018 0925   BILIRUBINUR NEGATIVE 04/25/2018 0925   KETONESUR NEGATIVE 04/25/2018 0925   PROTEINUR 30 (A) 04/25/2018 0925   UROBILINOGEN 0.2 04/25/2018 0925   NITRITE NEGATIVE 04/25/2018 0925   LEUKOCYTESUR TRACE (A) 04/25/2018 0925   Sepsis Labs Invalid input(s): PROCALCITONIN,  WBC,  LACTICIDVEN Microbiology Recent Results (from the past 240 hour(s))  SARS Coronavirus 2 by RT PCR (hospital order, performed in [place] hospital lab) Nasopharyngeal Nasopharyngeal Swab     Status: None   Collection Time: 01/12/20  2:28 AM   Specimen: Nasopharyngeal Swab  Result Value Ref Range Status   SARS Coronavirus 2 NEGATIVE NEGATIVE Final    Comment: (NOTE) SARS-CoV-2 target nucleic acids are NOT DETECTED.  The SARS-CoV-2 RNA is generally detectable in upper and lower respiratory specimens during the acute phase of infection. The lowest concentration of SARS-CoV-2  viral copies this assay can detect is 250 copies / mL. A negative result does not preclude SARS-CoV-2 infection and should not be used as the sole basis for treatment or other patient management decisions.  A negative result may occur with improper specimen collection / handling, submission of specimen other than nasopharyngeal swab, presence of viral mutation(s) within the areas targeted by this assay, and inadequate number of viral copies (<250 copies / mL). A negative result must be combined with clinical observations, patient history, and epidemiological information.  Fact Sheet for Patients:   StrictlyIdeas.no  Fact Sheet for Healthcare Providers: BankingDealers.co.za  This test is not yet approved or  cleared by the [place] FDA and has been authorized for detection and/or diagnosis of SARS-CoV-2 by FDA under an Emergency Use Authorization (EUA).  This EUA will remain in effect (meaning this test can be used) for the duration of the COVID-19 declaration under Section 564(b)(1) of the Act, 21 U.S.C. section 360bbb-3(b)(1), unless the authorization is terminated or revoked sooner.  Performed at [facility], [place] [address], [place], [place] [address]   Surgical PCR screen     Status: None   Collection Time: 01/13/20 12:56 AM   Specimen: Nasal Mucosa; Nasal Swab  Result Value Ref Range Status   MRSA, PCR NEGATIVE NEGATIVE Final   Staphylococcus aureus NEGATIVE NEGATIVE Final    Comment: (NOTE) The Xpert SA Assay (FDA approved for NASAL specimens in patients 39 years of age and older), is one component of a comprehensive surveillance program. It is not intended to diagnose infection nor to guide or monitor treatment. Performed at [facility], [place] [address] [place], [place] [address]      Time coordinating discharge: 39 minutes  SIGNED:   Georgette Shell, MD  [facility] 01/16/2020, 8:53 AM If 7PM-7AM, please contact night-coverage [email or web address] Password [email or web address]

## 2020-01-17 DIAGNOSIS — D510 Vitamin B12 deficiency anemia due to intrinsic factor deficiency: Secondary | ICD-10-CM | POA: Diagnosis not present

## 2020-01-17 DIAGNOSIS — Z959 Presence of cardiac and vascular implant and graft, unspecified: Secondary | ICD-10-CM | POA: Diagnosis not present

## 2020-01-17 DIAGNOSIS — I4891 Unspecified atrial fibrillation: Secondary | ICD-10-CM | POA: Diagnosis not present

## 2020-01-17 DIAGNOSIS — Z7901 Long term (current) use of anticoagulants: Secondary | ICD-10-CM | POA: Diagnosis not present

## 2020-01-17 DIAGNOSIS — M2550 Pain in unspecified joint: Secondary | ICD-10-CM | POA: Diagnosis not present

## 2020-01-17 DIAGNOSIS — Z4789 Encounter for other orthopedic aftercare: Secondary | ICD-10-CM | POA: Diagnosis not present

## 2020-01-17 DIAGNOSIS — M25551 Pain in right hip: Secondary | ICD-10-CM | POA: Diagnosis not present

## 2020-01-17 DIAGNOSIS — L988 Other specified disorders of the skin and subcutaneous tissue: Secondary | ICD-10-CM | POA: Diagnosis not present

## 2020-01-17 DIAGNOSIS — D6869 Other thrombophilia: Secondary | ICD-10-CM | POA: Diagnosis not present

## 2020-01-17 DIAGNOSIS — Z23 Encounter for immunization: Secondary | ICD-10-CM | POA: Diagnosis not present

## 2020-01-17 DIAGNOSIS — W19XXXA Unspecified fall, initial encounter: Secondary | ICD-10-CM | POA: Diagnosis not present

## 2020-01-17 DIAGNOSIS — S72031D Displaced midcervical fracture of right femur, subsequent encounter for closed fracture with routine healing: Secondary | ICD-10-CM | POA: Diagnosis not present

## 2020-01-17 DIAGNOSIS — J40 Bronchitis, not specified as acute or chronic: Secondary | ICD-10-CM | POA: Diagnosis not present

## 2020-01-17 DIAGNOSIS — D472 Monoclonal gammopathy: Secondary | ICD-10-CM | POA: Diagnosis not present

## 2020-01-17 DIAGNOSIS — R5381 Other malaise: Secondary | ICD-10-CM | POA: Diagnosis not present

## 2020-01-17 DIAGNOSIS — L299 Pruritus, unspecified: Secondary | ICD-10-CM | POA: Diagnosis not present

## 2020-01-17 DIAGNOSIS — I5020 Unspecified systolic (congestive) heart failure: Secondary | ICD-10-CM | POA: Diagnosis not present

## 2020-01-17 DIAGNOSIS — W19XXXD Unspecified fall, subsequent encounter: Secondary | ICD-10-CM | POA: Diagnosis not present

## 2020-01-17 DIAGNOSIS — R609 Edema, unspecified: Secondary | ICD-10-CM | POA: Diagnosis not present

## 2020-01-17 DIAGNOSIS — Z7401 Bed confinement status: Secondary | ICD-10-CM | POA: Diagnosis not present

## 2020-01-17 DIAGNOSIS — S72141D Displaced intertrochanteric fracture of right femur, subsequent encounter for closed fracture with routine healing: Secondary | ICD-10-CM | POA: Diagnosis not present

## 2020-01-17 DIAGNOSIS — E538 Deficiency of other specified B group vitamins: Secondary | ICD-10-CM | POA: Diagnosis not present

## 2020-01-17 DIAGNOSIS — I480 Paroxysmal atrial fibrillation: Secondary | ICD-10-CM | POA: Diagnosis not present

## 2020-01-17 DIAGNOSIS — D6859 Other primary thrombophilia: Secondary | ICD-10-CM | POA: Diagnosis not present

## 2020-01-17 DIAGNOSIS — R0902 Hypoxemia: Secondary | ICD-10-CM | POA: Diagnosis not present

## 2020-01-17 DIAGNOSIS — S72144A Nondisplaced intertrochanteric fracture of right femur, initial encounter for closed fracture: Secondary | ICD-10-CM | POA: Diagnosis not present

## 2020-01-17 DIAGNOSIS — N183 Chronic kidney disease, stage 3 unspecified: Secondary | ICD-10-CM | POA: Diagnosis not present

## 2020-01-17 DIAGNOSIS — I252 Old myocardial infarction: Secondary | ICD-10-CM | POA: Diagnosis not present

## 2020-01-17 DIAGNOSIS — I2510 Atherosclerotic heart disease of native coronary artery without angina pectoris: Secondary | ICD-10-CM | POA: Diagnosis not present

## 2020-01-17 DIAGNOSIS — S72001A Fracture of unspecified part of neck of right femur, initial encounter for closed fracture: Secondary | ICD-10-CM | POA: Diagnosis not present

## 2020-01-17 DIAGNOSIS — H3530 Unspecified macular degeneration: Secondary | ICD-10-CM | POA: Diagnosis not present

## 2020-01-17 LAB — SARS CORONAVIRUS 2 BY RT PCR (HOSPITAL ORDER, PERFORMED IN ~~LOC~~ HOSPITAL LAB): SARS Coronavirus 2: NEGATIVE

## 2020-01-17 MED ORDER — HYDROCODONE-ACETAMINOPHEN 5-325 MG PO TABS: 1.0000 | ORAL_TABLET | ORAL | 0 refills | Status: AC | PRN

## 2020-01-17 NOTE — Social Work (Signed)
TOC CSW reached out to [facility] in [place], [phone].  CSW left HIPPA compliant message with my contact information.  CSW will continue to follow for dc needs.  Lorie Cleckley Tarpley-Biondo, MSW, [place] Transitions of CareClinical Social Worker [email or web address] [phone]

## 2020-01-19 ENCOUNTER — Other Ambulatory Visit: Payer: Self-pay | Admitting: *Deleted

## 2020-01-19 NOTE — Patient Outreach (Signed)
Member screened for potential [facility] Care Management needs as a benefit of [place] Medicare.  Verified in Patient Shelly [place]rien that member is receiving skilled therapy at [facility].   Communication sent to facility SW to make aware writer is following for potential [facility] Care Management needs and transition plans.   Marthenia Rolling, MSN-Ed, RN,BSN [place] Acute Care Coordinator [phone] [facility]) [phone]  (Toll free office)

## 2020-01-20 DIAGNOSIS — L988 Other specified disorders of the skin and subcutaneous tissue: Secondary | ICD-10-CM | POA: Diagnosis not present

## 2020-01-21 ENCOUNTER — Other Ambulatory Visit: Payer: Self-pay | Admitting: *Deleted

## 2020-01-21 NOTE — Patient Outreach (Signed)
[facility] Post- Acute Care Coordinator follow up. Member screened for potential [facility] Care Management needs as a benefit of [place] Medicare.  Update received from [facility] SW, [place]. Member recently admitted to [facility] for skilled therapy. More information to come once facility has care plan meeting.   Will continue to follow for transition plans and potential [facility] Care Management needs.   Marthenia Rolling, MSN-Ed, RN,BSN [place] Acute Care Coordinator [phone] [facility]) [phone]  (Toll free office)

## 2020-01-26 ENCOUNTER — Other Ambulatory Visit: Payer: Self-pay | Admitting: *Deleted

## 2020-01-26 DIAGNOSIS — S72031D Displaced midcervical fracture of right femur, subsequent encounter for closed fracture with routine healing: Secondary | ICD-10-CM | POA: Diagnosis not present

## 2020-01-26 NOTE — Patient Outreach (Signed)
[facility] Post- Acute Care Coordinator follow up. Member screened for potential [facility] Care Management needs as a benefit of [place] Medicare.  Per Patient [place] member remains in [facility].   Communication sent to [facility] SW to request update on transtion plans.   Will continue to follow while member resides in SNF.   Marthenia Rolling, MSN-Ed, RN,BSN [place] Acute Care Coordinator [phone] [facility]) [phone]  (Toll free office)

## 2020-01-27 DIAGNOSIS — L988 Other specified disorders of the skin and subcutaneous tissue: Secondary | ICD-10-CM | POA: Diagnosis not present

## 2020-02-02 ENCOUNTER — Other Ambulatory Visit: Payer: Self-pay | Admitting: *Deleted

## 2020-02-02 DIAGNOSIS — I130 Hypertensive heart and chronic kidney disease with heart failure and stage 1 through stage 4 chronic kidney disease, or unspecified chronic kidney disease: Secondary | ICD-10-CM | POA: Diagnosis not present

## 2020-02-02 DIAGNOSIS — J320 Chronic maxillary sinusitis: Secondary | ICD-10-CM | POA: Diagnosis not present

## 2020-02-02 DIAGNOSIS — S81812D Laceration without foreign body, left lower leg, subsequent encounter: Secondary | ICD-10-CM | POA: Diagnosis not present

## 2020-02-02 DIAGNOSIS — S81811D Laceration without foreign body, right lower leg, subsequent encounter: Secondary | ICD-10-CM | POA: Diagnosis not present

## 2020-02-02 DIAGNOSIS — I77819 Aortic ectasia, unspecified site: Secondary | ICD-10-CM | POA: Diagnosis not present

## 2020-02-02 DIAGNOSIS — I2510 Atherosclerotic heart disease of native coronary artery without angina pectoris: Secondary | ICD-10-CM | POA: Diagnosis not present

## 2020-02-02 DIAGNOSIS — K224 Dyskinesia of esophagus: Secondary | ICD-10-CM | POA: Diagnosis not present

## 2020-02-02 DIAGNOSIS — D510 Vitamin B12 deficiency anemia due to intrinsic factor deficiency: Secondary | ICD-10-CM | POA: Diagnosis not present

## 2020-02-02 DIAGNOSIS — J40 Bronchitis, not specified as acute or chronic: Secondary | ICD-10-CM | POA: Diagnosis not present

## 2020-02-02 DIAGNOSIS — I480 Paroxysmal atrial fibrillation: Secondary | ICD-10-CM | POA: Diagnosis not present

## 2020-02-02 DIAGNOSIS — N1832 Chronic kidney disease, stage 3b: Secondary | ICD-10-CM | POA: Diagnosis not present

## 2020-02-02 DIAGNOSIS — N281 Cyst of kidney, acquired: Secondary | ICD-10-CM | POA: Diagnosis not present

## 2020-02-02 DIAGNOSIS — M4802 Spinal stenosis, cervical region: Secondary | ICD-10-CM | POA: Diagnosis not present

## 2020-02-02 DIAGNOSIS — K5730 Diverticulosis of large intestine without perforation or abscess without bleeding: Secondary | ICD-10-CM | POA: Diagnosis not present

## 2020-02-02 DIAGNOSIS — I252 Old myocardial infarction: Secondary | ICD-10-CM | POA: Diagnosis not present

## 2020-02-02 DIAGNOSIS — S72141D Displaced intertrochanteric fracture of right femur, subsequent encounter for closed fracture with routine healing: Secondary | ICD-10-CM | POA: Diagnosis not present

## 2020-02-02 DIAGNOSIS — M810 Age-related osteoporosis without current pathological fracture: Secondary | ICD-10-CM | POA: Diagnosis not present

## 2020-02-02 DIAGNOSIS — I5020 Unspecified systolic (congestive) heart failure: Secondary | ICD-10-CM | POA: Diagnosis not present

## 2020-02-02 DIAGNOSIS — J9811 Atelectasis: Secondary | ICD-10-CM | POA: Diagnosis not present

## 2020-02-02 DIAGNOSIS — N200 Calculus of kidney: Secondary | ICD-10-CM | POA: Diagnosis not present

## 2020-02-02 DIAGNOSIS — M1711 Unilateral primary osteoarthritis, right knee: Secondary | ICD-10-CM | POA: Diagnosis not present

## 2020-02-02 DIAGNOSIS — D126 Benign neoplasm of colon, unspecified: Secondary | ICD-10-CM | POA: Diagnosis not present

## 2020-02-02 DIAGNOSIS — M48061 Spinal stenosis, lumbar region without neurogenic claudication: Secondary | ICD-10-CM | POA: Diagnosis not present

## 2020-02-02 DIAGNOSIS — H3530 Unspecified macular degeneration: Secondary | ICD-10-CM | POA: Diagnosis not present

## 2020-02-02 DIAGNOSIS — M452 Ankylosing spondylitis of cervical region: Secondary | ICD-10-CM | POA: Diagnosis not present

## 2020-02-02 NOTE — Patient Outreach (Signed)
[facility]N Post- Acute Care Coordinator follow up. Member screened for potential [facility] Care Management needs as a benefit of [place] Medicare.  Verified in Patient Shelly [place] that member discharged from [facility].   Facility SW recently previously reported member to discharge to [facility] with continued therapy in-house.  No identifiable [facility] Care Management needs at this time.   Marthenia Rolling, MSN-Ed, RN,BSN [place] Acute Care Coordinator [phone] [facility]) [phone]  (Toll free office)

## 2020-02-04 DIAGNOSIS — I130 Hypertensive heart and chronic kidney disease with heart failure and stage 1 through stage 4 chronic kidney disease, or unspecified chronic kidney disease: Secondary | ICD-10-CM | POA: Diagnosis not present

## 2020-02-04 DIAGNOSIS — I480 Paroxysmal atrial fibrillation: Secondary | ICD-10-CM | POA: Diagnosis not present

## 2020-02-04 DIAGNOSIS — N1832 Chronic kidney disease, stage 3b: Secondary | ICD-10-CM | POA: Diagnosis not present

## 2020-02-04 DIAGNOSIS — I5020 Unspecified systolic (congestive) heart failure: Secondary | ICD-10-CM | POA: Diagnosis not present

## 2020-02-04 DIAGNOSIS — I2510 Atherosclerotic heart disease of native coronary artery without angina pectoris: Secondary | ICD-10-CM | POA: Diagnosis not present

## 2020-02-04 DIAGNOSIS — S72141D Displaced intertrochanteric fracture of right femur, subsequent encounter for closed fracture with routine healing: Secondary | ICD-10-CM | POA: Diagnosis not present

## 2020-02-05 DIAGNOSIS — N1832 Chronic kidney disease, stage 3b: Secondary | ICD-10-CM | POA: Diagnosis not present

## 2020-02-05 DIAGNOSIS — S81801A Unspecified open wound, right lower leg, initial encounter: Secondary | ICD-10-CM | POA: Diagnosis not present

## 2020-02-05 DIAGNOSIS — I130 Hypertensive heart and chronic kidney disease with heart failure and stage 1 through stage 4 chronic kidney disease, or unspecified chronic kidney disease: Secondary | ICD-10-CM | POA: Diagnosis not present

## 2020-02-05 DIAGNOSIS — I2510 Atherosclerotic heart disease of native coronary artery without angina pectoris: Secondary | ICD-10-CM | POA: Diagnosis not present

## 2020-02-05 DIAGNOSIS — S72141D Displaced intertrochanteric fracture of right femur, subsequent encounter for closed fracture with routine healing: Secondary | ICD-10-CM | POA: Diagnosis not present

## 2020-02-05 DIAGNOSIS — I5020 Unspecified systolic (congestive) heart failure: Secondary | ICD-10-CM | POA: Diagnosis not present

## 2020-02-05 DIAGNOSIS — I480 Paroxysmal atrial fibrillation: Secondary | ICD-10-CM | POA: Diagnosis not present

## 2020-02-05 DIAGNOSIS — Z8781 Personal history of (healed) traumatic fracture: Secondary | ICD-10-CM | POA: Diagnosis not present

## 2020-02-06 DIAGNOSIS — I130 Hypertensive heart and chronic kidney disease with heart failure and stage 1 through stage 4 chronic kidney disease, or unspecified chronic kidney disease: Secondary | ICD-10-CM | POA: Diagnosis not present

## 2020-02-06 DIAGNOSIS — S72141D Displaced intertrochanteric fracture of right femur, subsequent encounter for closed fracture with routine healing: Secondary | ICD-10-CM | POA: Diagnosis not present

## 2020-02-06 DIAGNOSIS — I480 Paroxysmal atrial fibrillation: Secondary | ICD-10-CM | POA: Diagnosis not present

## 2020-02-06 DIAGNOSIS — I2510 Atherosclerotic heart disease of native coronary artery without angina pectoris: Secondary | ICD-10-CM | POA: Diagnosis not present

## 2020-02-06 DIAGNOSIS — N1832 Chronic kidney disease, stage 3b: Secondary | ICD-10-CM | POA: Diagnosis not present

## 2020-02-06 DIAGNOSIS — I5020 Unspecified systolic (congestive) heart failure: Secondary | ICD-10-CM | POA: Diagnosis not present

## 2020-02-11 DIAGNOSIS — S72141D Displaced intertrochanteric fracture of right femur, subsequent encounter for closed fracture with routine healing: Secondary | ICD-10-CM | POA: Diagnosis not present

## 2020-02-11 DIAGNOSIS — I2510 Atherosclerotic heart disease of native coronary artery without angina pectoris: Secondary | ICD-10-CM | POA: Diagnosis not present

## 2020-02-11 DIAGNOSIS — N1832 Chronic kidney disease, stage 3b: Secondary | ICD-10-CM | POA: Diagnosis not present

## 2020-02-11 DIAGNOSIS — I130 Hypertensive heart and chronic kidney disease with heart failure and stage 1 through stage 4 chronic kidney disease, or unspecified chronic kidney disease: Secondary | ICD-10-CM | POA: Diagnosis not present

## 2020-02-11 DIAGNOSIS — I5020 Unspecified systolic (congestive) heart failure: Secondary | ICD-10-CM | POA: Diagnosis not present

## 2020-02-11 DIAGNOSIS — I480 Paroxysmal atrial fibrillation: Secondary | ICD-10-CM | POA: Diagnosis not present

## 2020-02-12 DIAGNOSIS — N1832 Chronic kidney disease, stage 3b: Secondary | ICD-10-CM | POA: Diagnosis not present

## 2020-02-12 DIAGNOSIS — I480 Paroxysmal atrial fibrillation: Secondary | ICD-10-CM | POA: Diagnosis not present

## 2020-02-12 DIAGNOSIS — S72141D Displaced intertrochanteric fracture of right femur, subsequent encounter for closed fracture with routine healing: Secondary | ICD-10-CM | POA: Diagnosis not present

## 2020-02-12 DIAGNOSIS — I5020 Unspecified systolic (congestive) heart failure: Secondary | ICD-10-CM | POA: Diagnosis not present

## 2020-02-12 DIAGNOSIS — I2510 Atherosclerotic heart disease of native coronary artery without angina pectoris: Secondary | ICD-10-CM | POA: Diagnosis not present

## 2020-02-12 DIAGNOSIS — I130 Hypertensive heart and chronic kidney disease with heart failure and stage 1 through stage 4 chronic kidney disease, or unspecified chronic kidney disease: Secondary | ICD-10-CM | POA: Diagnosis not present

## 2020-02-15 DIAGNOSIS — I480 Paroxysmal atrial fibrillation: Secondary | ICD-10-CM | POA: Diagnosis not present

## 2020-02-15 DIAGNOSIS — I130 Hypertensive heart and chronic kidney disease with heart failure and stage 1 through stage 4 chronic kidney disease, or unspecified chronic kidney disease: Secondary | ICD-10-CM | POA: Diagnosis not present

## 2020-02-15 DIAGNOSIS — I5020 Unspecified systolic (congestive) heart failure: Secondary | ICD-10-CM | POA: Diagnosis not present

## 2020-02-15 DIAGNOSIS — N1832 Chronic kidney disease, stage 3b: Secondary | ICD-10-CM | POA: Diagnosis not present

## 2020-02-15 DIAGNOSIS — S72141D Displaced intertrochanteric fracture of right femur, subsequent encounter for closed fracture with routine healing: Secondary | ICD-10-CM | POA: Diagnosis not present

## 2020-02-15 DIAGNOSIS — I2510 Atherosclerotic heart disease of native coronary artery without angina pectoris: Secondary | ICD-10-CM | POA: Diagnosis not present

## 2020-02-16 DIAGNOSIS — I5020 Unspecified systolic (congestive) heart failure: Secondary | ICD-10-CM | POA: Diagnosis not present

## 2020-02-16 DIAGNOSIS — I480 Paroxysmal atrial fibrillation: Secondary | ICD-10-CM | POA: Diagnosis not present

## 2020-02-16 DIAGNOSIS — I2510 Atherosclerotic heart disease of native coronary artery without angina pectoris: Secondary | ICD-10-CM | POA: Diagnosis not present

## 2020-02-16 DIAGNOSIS — S72141D Displaced intertrochanteric fracture of right femur, subsequent encounter for closed fracture with routine healing: Secondary | ICD-10-CM | POA: Diagnosis not present

## 2020-02-16 DIAGNOSIS — N1832 Chronic kidney disease, stage 3b: Secondary | ICD-10-CM | POA: Diagnosis not present

## 2020-02-16 DIAGNOSIS — I130 Hypertensive heart and chronic kidney disease with heart failure and stage 1 through stage 4 chronic kidney disease, or unspecified chronic kidney disease: Secondary | ICD-10-CM | POA: Diagnosis not present

## 2020-02-18 DIAGNOSIS — I130 Hypertensive heart and chronic kidney disease with heart failure and stage 1 through stage 4 chronic kidney disease, or unspecified chronic kidney disease: Secondary | ICD-10-CM | POA: Diagnosis not present

## 2020-02-18 DIAGNOSIS — Z9842 Cataract extraction status, left eye: Secondary | ICD-10-CM | POA: Diagnosis not present

## 2020-02-18 DIAGNOSIS — I480 Paroxysmal atrial fibrillation: Secondary | ICD-10-CM | POA: Diagnosis not present

## 2020-02-18 DIAGNOSIS — H353231 Exudative age-related macular degeneration, bilateral, with active choroidal neovascularization: Secondary | ICD-10-CM | POA: Diagnosis not present

## 2020-02-18 DIAGNOSIS — I2510 Atherosclerotic heart disease of native coronary artery without angina pectoris: Secondary | ICD-10-CM | POA: Diagnosis not present

## 2020-02-18 DIAGNOSIS — H02102 Unspecified ectropion of right lower eyelid: Secondary | ICD-10-CM | POA: Diagnosis not present

## 2020-02-18 DIAGNOSIS — S72141D Displaced intertrochanteric fracture of right femur, subsequent encounter for closed fracture with routine healing: Secondary | ICD-10-CM | POA: Diagnosis not present

## 2020-02-18 DIAGNOSIS — I5020 Unspecified systolic (congestive) heart failure: Secondary | ICD-10-CM | POA: Diagnosis not present

## 2020-02-18 DIAGNOSIS — Z9841 Cataract extraction status, right eye: Secondary | ICD-10-CM | POA: Diagnosis not present

## 2020-02-18 DIAGNOSIS — Z961 Presence of intraocular lens: Secondary | ICD-10-CM | POA: Diagnosis not present

## 2020-02-18 DIAGNOSIS — N1832 Chronic kidney disease, stage 3b: Secondary | ICD-10-CM | POA: Diagnosis not present

## 2020-02-23 DIAGNOSIS — I130 Hypertensive heart and chronic kidney disease with heart failure and stage 1 through stage 4 chronic kidney disease, or unspecified chronic kidney disease: Secondary | ICD-10-CM | POA: Diagnosis not present

## 2020-02-23 DIAGNOSIS — S72141D Displaced intertrochanteric fracture of right femur, subsequent encounter for closed fracture with routine healing: Secondary | ICD-10-CM | POA: Diagnosis not present

## 2020-02-23 DIAGNOSIS — I480 Paroxysmal atrial fibrillation: Secondary | ICD-10-CM | POA: Diagnosis not present

## 2020-02-23 DIAGNOSIS — S72031D Displaced midcervical fracture of right femur, subsequent encounter for closed fracture with routine healing: Secondary | ICD-10-CM | POA: Diagnosis not present

## 2020-02-23 DIAGNOSIS — N1832 Chronic kidney disease, stage 3b: Secondary | ICD-10-CM | POA: Diagnosis not present

## 2020-02-23 DIAGNOSIS — I5020 Unspecified systolic (congestive) heart failure: Secondary | ICD-10-CM | POA: Diagnosis not present

## 2020-02-23 DIAGNOSIS — I2510 Atherosclerotic heart disease of native coronary artery without angina pectoris: Secondary | ICD-10-CM | POA: Diagnosis not present

## 2020-02-24 DIAGNOSIS — N1832 Chronic kidney disease, stage 3b: Secondary | ICD-10-CM | POA: Diagnosis not present

## 2020-02-24 DIAGNOSIS — I2510 Atherosclerotic heart disease of native coronary artery without angina pectoris: Secondary | ICD-10-CM | POA: Diagnosis not present

## 2020-02-24 DIAGNOSIS — I130 Hypertensive heart and chronic kidney disease with heart failure and stage 1 through stage 4 chronic kidney disease, or unspecified chronic kidney disease: Secondary | ICD-10-CM | POA: Diagnosis not present

## 2020-02-24 DIAGNOSIS — I5020 Unspecified systolic (congestive) heart failure: Secondary | ICD-10-CM | POA: Diagnosis not present

## 2020-02-24 DIAGNOSIS — S72141D Displaced intertrochanteric fracture of right femur, subsequent encounter for closed fracture with routine healing: Secondary | ICD-10-CM | POA: Diagnosis not present

## 2020-02-24 DIAGNOSIS — I480 Paroxysmal atrial fibrillation: Secondary | ICD-10-CM | POA: Diagnosis not present

## 2020-02-25 DIAGNOSIS — I5020 Unspecified systolic (congestive) heart failure: Secondary | ICD-10-CM | POA: Diagnosis not present

## 2020-02-25 DIAGNOSIS — N1832 Chronic kidney disease, stage 3b: Secondary | ICD-10-CM | POA: Diagnosis not present

## 2020-02-25 DIAGNOSIS — I480 Paroxysmal atrial fibrillation: Secondary | ICD-10-CM | POA: Diagnosis not present

## 2020-02-25 DIAGNOSIS — I130 Hypertensive heart and chronic kidney disease with heart failure and stage 1 through stage 4 chronic kidney disease, or unspecified chronic kidney disease: Secondary | ICD-10-CM | POA: Diagnosis not present

## 2020-02-25 DIAGNOSIS — I2510 Atherosclerotic heart disease of native coronary artery without angina pectoris: Secondary | ICD-10-CM | POA: Diagnosis not present

## 2020-02-25 DIAGNOSIS — S72141D Displaced intertrochanteric fracture of right femur, subsequent encounter for closed fracture with routine healing: Secondary | ICD-10-CM | POA: Diagnosis not present

## 2020-02-28 DIAGNOSIS — I480 Paroxysmal atrial fibrillation: Secondary | ICD-10-CM | POA: Diagnosis not present

## 2020-02-28 DIAGNOSIS — I5020 Unspecified systolic (congestive) heart failure: Secondary | ICD-10-CM | POA: Diagnosis not present

## 2020-02-28 DIAGNOSIS — N1832 Chronic kidney disease, stage 3b: Secondary | ICD-10-CM | POA: Diagnosis not present

## 2020-02-28 DIAGNOSIS — S72141D Displaced intertrochanteric fracture of right femur, subsequent encounter for closed fracture with routine healing: Secondary | ICD-10-CM | POA: Diagnosis not present

## 2020-02-28 DIAGNOSIS — I130 Hypertensive heart and chronic kidney disease with heart failure and stage 1 through stage 4 chronic kidney disease, or unspecified chronic kidney disease: Secondary | ICD-10-CM | POA: Diagnosis not present

## 2020-02-28 DIAGNOSIS — I2510 Atherosclerotic heart disease of native coronary artery without angina pectoris: Secondary | ICD-10-CM | POA: Diagnosis not present

## 2020-03-03 DIAGNOSIS — I252 Old myocardial infarction: Secondary | ICD-10-CM | POA: Diagnosis not present

## 2020-03-03 DIAGNOSIS — H3530 Unspecified macular degeneration: Secondary | ICD-10-CM | POA: Diagnosis not present

## 2020-03-03 DIAGNOSIS — M452 Ankylosing spondylitis of cervical region: Secondary | ICD-10-CM | POA: Diagnosis not present

## 2020-03-03 DIAGNOSIS — N1832 Chronic kidney disease, stage 3b: Secondary | ICD-10-CM | POA: Diagnosis not present

## 2020-03-03 DIAGNOSIS — I77819 Aortic ectasia, unspecified site: Secondary | ICD-10-CM | POA: Diagnosis not present

## 2020-03-03 DIAGNOSIS — M1711 Unilateral primary osteoarthritis, right knee: Secondary | ICD-10-CM | POA: Diagnosis not present

## 2020-03-03 DIAGNOSIS — I2510 Atherosclerotic heart disease of native coronary artery without angina pectoris: Secondary | ICD-10-CM | POA: Diagnosis not present

## 2020-03-03 DIAGNOSIS — J9811 Atelectasis: Secondary | ICD-10-CM | POA: Diagnosis not present

## 2020-03-03 DIAGNOSIS — I5020 Unspecified systolic (congestive) heart failure: Secondary | ICD-10-CM | POA: Diagnosis not present

## 2020-03-03 DIAGNOSIS — M4802 Spinal stenosis, cervical region: Secondary | ICD-10-CM | POA: Diagnosis not present

## 2020-03-03 DIAGNOSIS — I480 Paroxysmal atrial fibrillation: Secondary | ICD-10-CM | POA: Diagnosis not present

## 2020-03-03 DIAGNOSIS — J40 Bronchitis, not specified as acute or chronic: Secondary | ICD-10-CM | POA: Diagnosis not present

## 2020-03-03 DIAGNOSIS — I130 Hypertensive heart and chronic kidney disease with heart failure and stage 1 through stage 4 chronic kidney disease, or unspecified chronic kidney disease: Secondary | ICD-10-CM | POA: Diagnosis not present

## 2020-03-03 DIAGNOSIS — M810 Age-related osteoporosis without current pathological fracture: Secondary | ICD-10-CM | POA: Diagnosis not present

## 2020-03-03 DIAGNOSIS — K224 Dyskinesia of esophagus: Secondary | ICD-10-CM | POA: Diagnosis not present

## 2020-03-03 DIAGNOSIS — K5730 Diverticulosis of large intestine without perforation or abscess without bleeding: Secondary | ICD-10-CM | POA: Diagnosis not present

## 2020-03-03 DIAGNOSIS — N281 Cyst of kidney, acquired: Secondary | ICD-10-CM | POA: Diagnosis not present

## 2020-03-03 DIAGNOSIS — S72141D Displaced intertrochanteric fracture of right femur, subsequent encounter for closed fracture with routine healing: Secondary | ICD-10-CM | POA: Diagnosis not present

## 2020-03-03 DIAGNOSIS — M48061 Spinal stenosis, lumbar region without neurogenic claudication: Secondary | ICD-10-CM | POA: Diagnosis not present

## 2020-03-03 DIAGNOSIS — J320 Chronic maxillary sinusitis: Secondary | ICD-10-CM | POA: Diagnosis not present

## 2020-03-03 DIAGNOSIS — D126 Benign neoplasm of colon, unspecified: Secondary | ICD-10-CM | POA: Diagnosis not present

## 2020-03-03 DIAGNOSIS — D510 Vitamin B12 deficiency anemia due to intrinsic factor deficiency: Secondary | ICD-10-CM | POA: Diagnosis not present

## 2020-03-03 DIAGNOSIS — S81811D Laceration without foreign body, right lower leg, subsequent encounter: Secondary | ICD-10-CM | POA: Diagnosis not present

## 2020-03-03 DIAGNOSIS — N200 Calculus of kidney: Secondary | ICD-10-CM | POA: Diagnosis not present

## 2020-03-03 DIAGNOSIS — S81812D Laceration without foreign body, left lower leg, subsequent encounter: Secondary | ICD-10-CM | POA: Diagnosis not present

## 2020-03-04 DIAGNOSIS — I130 Hypertensive heart and chronic kidney disease with heart failure and stage 1 through stage 4 chronic kidney disease, or unspecified chronic kidney disease: Secondary | ICD-10-CM | POA: Diagnosis not present

## 2020-03-04 DIAGNOSIS — I5020 Unspecified systolic (congestive) heart failure: Secondary | ICD-10-CM | POA: Diagnosis not present

## 2020-03-04 DIAGNOSIS — I480 Paroxysmal atrial fibrillation: Secondary | ICD-10-CM | POA: Diagnosis not present

## 2020-03-04 DIAGNOSIS — S72141D Displaced intertrochanteric fracture of right femur, subsequent encounter for closed fracture with routine healing: Secondary | ICD-10-CM | POA: Diagnosis not present

## 2020-03-04 DIAGNOSIS — I2510 Atherosclerotic heart disease of native coronary artery without angina pectoris: Secondary | ICD-10-CM | POA: Diagnosis not present

## 2020-03-04 DIAGNOSIS — N1832 Chronic kidney disease, stage 3b: Secondary | ICD-10-CM | POA: Diagnosis not present

## 2020-03-09 DIAGNOSIS — I130 Hypertensive heart and chronic kidney disease with heart failure and stage 1 through stage 4 chronic kidney disease, or unspecified chronic kidney disease: Secondary | ICD-10-CM | POA: Diagnosis not present

## 2020-03-09 DIAGNOSIS — N1832 Chronic kidney disease, stage 3b: Secondary | ICD-10-CM | POA: Diagnosis not present

## 2020-03-09 DIAGNOSIS — I5020 Unspecified systolic (congestive) heart failure: Secondary | ICD-10-CM | POA: Diagnosis not present

## 2020-03-09 DIAGNOSIS — I480 Paroxysmal atrial fibrillation: Secondary | ICD-10-CM | POA: Diagnosis not present

## 2020-03-09 DIAGNOSIS — S72141D Displaced intertrochanteric fracture of right femur, subsequent encounter for closed fracture with routine healing: Secondary | ICD-10-CM | POA: Diagnosis not present

## 2020-03-09 DIAGNOSIS — I2510 Atherosclerotic heart disease of native coronary artery without angina pectoris: Secondary | ICD-10-CM | POA: Diagnosis not present

## 2020-03-10 DIAGNOSIS — I130 Hypertensive heart and chronic kidney disease with heart failure and stage 1 through stage 4 chronic kidney disease, or unspecified chronic kidney disease: Secondary | ICD-10-CM | POA: Diagnosis not present

## 2020-03-10 DIAGNOSIS — N1832 Chronic kidney disease, stage 3b: Secondary | ICD-10-CM | POA: Diagnosis not present

## 2020-03-10 DIAGNOSIS — I480 Paroxysmal atrial fibrillation: Secondary | ICD-10-CM | POA: Diagnosis not present

## 2020-03-10 DIAGNOSIS — S72141D Displaced intertrochanteric fracture of right femur, subsequent encounter for closed fracture with routine healing: Secondary | ICD-10-CM | POA: Diagnosis not present

## 2020-03-10 DIAGNOSIS — I2510 Atherosclerotic heart disease of native coronary artery without angina pectoris: Secondary | ICD-10-CM | POA: Diagnosis not present

## 2020-03-10 DIAGNOSIS — I5020 Unspecified systolic (congestive) heart failure: Secondary | ICD-10-CM | POA: Diagnosis not present

## 2020-03-15 DIAGNOSIS — N1832 Chronic kidney disease, stage 3b: Secondary | ICD-10-CM | POA: Diagnosis not present

## 2020-03-15 DIAGNOSIS — I5020 Unspecified systolic (congestive) heart failure: Secondary | ICD-10-CM | POA: Diagnosis not present

## 2020-03-15 DIAGNOSIS — I130 Hypertensive heart and chronic kidney disease with heart failure and stage 1 through stage 4 chronic kidney disease, or unspecified chronic kidney disease: Secondary | ICD-10-CM | POA: Diagnosis not present

## 2020-03-15 DIAGNOSIS — I2510 Atherosclerotic heart disease of native coronary artery without angina pectoris: Secondary | ICD-10-CM | POA: Diagnosis not present

## 2020-03-15 DIAGNOSIS — S72141D Displaced intertrochanteric fracture of right femur, subsequent encounter for closed fracture with routine healing: Secondary | ICD-10-CM | POA: Diagnosis not present

## 2020-03-15 DIAGNOSIS — I480 Paroxysmal atrial fibrillation: Secondary | ICD-10-CM | POA: Diagnosis not present

## 2020-03-16 DIAGNOSIS — I130 Hypertensive heart and chronic kidney disease with heart failure and stage 1 through stage 4 chronic kidney disease, or unspecified chronic kidney disease: Secondary | ICD-10-CM | POA: Diagnosis not present

## 2020-03-16 DIAGNOSIS — I2510 Atherosclerotic heart disease of native coronary artery without angina pectoris: Secondary | ICD-10-CM | POA: Diagnosis not present

## 2020-03-16 DIAGNOSIS — S72141D Displaced intertrochanteric fracture of right femur, subsequent encounter for closed fracture with routine healing: Secondary | ICD-10-CM | POA: Diagnosis not present

## 2020-03-16 DIAGNOSIS — I480 Paroxysmal atrial fibrillation: Secondary | ICD-10-CM | POA: Diagnosis not present

## 2020-03-16 DIAGNOSIS — I5020 Unspecified systolic (congestive) heart failure: Secondary | ICD-10-CM | POA: Diagnosis not present

## 2020-03-16 DIAGNOSIS — N1832 Chronic kidney disease, stage 3b: Secondary | ICD-10-CM | POA: Diagnosis not present

## 2020-03-17 DIAGNOSIS — N1832 Chronic kidney disease, stage 3b: Secondary | ICD-10-CM | POA: Diagnosis not present

## 2020-03-17 DIAGNOSIS — I480 Paroxysmal atrial fibrillation: Secondary | ICD-10-CM | POA: Diagnosis not present

## 2020-03-17 DIAGNOSIS — I2510 Atherosclerotic heart disease of native coronary artery without angina pectoris: Secondary | ICD-10-CM | POA: Diagnosis not present

## 2020-03-17 DIAGNOSIS — I130 Hypertensive heart and chronic kidney disease with heart failure and stage 1 through stage 4 chronic kidney disease, or unspecified chronic kidney disease: Secondary | ICD-10-CM | POA: Diagnosis not present

## 2020-03-17 DIAGNOSIS — I5020 Unspecified systolic (congestive) heart failure: Secondary | ICD-10-CM | POA: Diagnosis not present

## 2020-03-17 DIAGNOSIS — S72141D Displaced intertrochanteric fracture of right femur, subsequent encounter for closed fracture with routine healing: Secondary | ICD-10-CM | POA: Diagnosis not present

## 2020-03-23 DIAGNOSIS — I130 Hypertensive heart and chronic kidney disease with heart failure and stage 1 through stage 4 chronic kidney disease, or unspecified chronic kidney disease: Secondary | ICD-10-CM | POA: Diagnosis not present

## 2020-03-23 DIAGNOSIS — I2510 Atherosclerotic heart disease of native coronary artery without angina pectoris: Secondary | ICD-10-CM | POA: Diagnosis not present

## 2020-03-23 DIAGNOSIS — S72141D Displaced intertrochanteric fracture of right femur, subsequent encounter for closed fracture with routine healing: Secondary | ICD-10-CM | POA: Diagnosis not present

## 2020-03-23 DIAGNOSIS — I480 Paroxysmal atrial fibrillation: Secondary | ICD-10-CM | POA: Diagnosis not present

## 2020-03-23 DIAGNOSIS — I5020 Unspecified systolic (congestive) heart failure: Secondary | ICD-10-CM | POA: Diagnosis not present

## 2020-03-23 DIAGNOSIS — N1832 Chronic kidney disease, stage 3b: Secondary | ICD-10-CM | POA: Diagnosis not present

## 2020-03-24 DIAGNOSIS — I480 Paroxysmal atrial fibrillation: Secondary | ICD-10-CM | POA: Diagnosis not present

## 2020-03-24 DIAGNOSIS — I130 Hypertensive heart and chronic kidney disease with heart failure and stage 1 through stage 4 chronic kidney disease, or unspecified chronic kidney disease: Secondary | ICD-10-CM | POA: Diagnosis not present

## 2020-03-24 DIAGNOSIS — S72141D Displaced intertrochanteric fracture of right femur, subsequent encounter for closed fracture with routine healing: Secondary | ICD-10-CM | POA: Diagnosis not present

## 2020-03-24 DIAGNOSIS — N1832 Chronic kidney disease, stage 3b: Secondary | ICD-10-CM | POA: Diagnosis not present

## 2020-03-24 DIAGNOSIS — I2510 Atherosclerotic heart disease of native coronary artery without angina pectoris: Secondary | ICD-10-CM | POA: Diagnosis not present

## 2020-03-24 DIAGNOSIS — I5020 Unspecified systolic (congestive) heart failure: Secondary | ICD-10-CM | POA: Diagnosis not present

## 2020-03-25 DIAGNOSIS — I480 Paroxysmal atrial fibrillation: Secondary | ICD-10-CM | POA: Diagnosis not present

## 2020-03-25 DIAGNOSIS — I2510 Atherosclerotic heart disease of native coronary artery without angina pectoris: Secondary | ICD-10-CM | POA: Diagnosis not present

## 2020-03-25 DIAGNOSIS — I130 Hypertensive heart and chronic kidney disease with heart failure and stage 1 through stage 4 chronic kidney disease, or unspecified chronic kidney disease: Secondary | ICD-10-CM | POA: Diagnosis not present

## 2020-03-25 DIAGNOSIS — S72141D Displaced intertrochanteric fracture of right femur, subsequent encounter for closed fracture with routine healing: Secondary | ICD-10-CM | POA: Diagnosis not present

## 2020-03-25 DIAGNOSIS — N1832 Chronic kidney disease, stage 3b: Secondary | ICD-10-CM | POA: Diagnosis not present

## 2020-03-25 DIAGNOSIS — I5020 Unspecified systolic (congestive) heart failure: Secondary | ICD-10-CM | POA: Diagnosis not present

## 2020-03-29 DIAGNOSIS — Z23 Encounter for immunization: Secondary | ICD-10-CM | POA: Diagnosis not present

## 2020-03-29 DIAGNOSIS — I2510 Atherosclerotic heart disease of native coronary artery without angina pectoris: Secondary | ICD-10-CM | POA: Diagnosis not present

## 2020-03-29 DIAGNOSIS — S72141D Displaced intertrochanteric fracture of right femur, subsequent encounter for closed fracture with routine healing: Secondary | ICD-10-CM | POA: Diagnosis not present

## 2020-03-29 DIAGNOSIS — N1832 Chronic kidney disease, stage 3b: Secondary | ICD-10-CM | POA: Diagnosis not present

## 2020-03-29 DIAGNOSIS — I130 Hypertensive heart and chronic kidney disease with heart failure and stage 1 through stage 4 chronic kidney disease, or unspecified chronic kidney disease: Secondary | ICD-10-CM | POA: Diagnosis not present

## 2020-03-29 DIAGNOSIS — I5020 Unspecified systolic (congestive) heart failure: Secondary | ICD-10-CM | POA: Diagnosis not present

## 2020-03-29 DIAGNOSIS — I480 Paroxysmal atrial fibrillation: Secondary | ICD-10-CM | POA: Diagnosis not present

## 2020-04-05 DIAGNOSIS — S72031D Displaced midcervical fracture of right femur, subsequent encounter for closed fracture with routine healing: Secondary | ICD-10-CM | POA: Diagnosis not present

## 2020-04-14 DIAGNOSIS — H353231 Exudative age-related macular degeneration, bilateral, with active choroidal neovascularization: Secondary | ICD-10-CM | POA: Diagnosis not present

## 2020-04-21 NOTE — Progress Notes (Addendum)
Virtual Visit via Video Note   This visit type was conducted due to national recommendations for restrictions regarding the COVID-19 Pandemic (e.g. social distancing) in an effort to limit this patient's exposure and mitigate transmission in our community.  Due to her co-morbid illnesses, this patient is at least at moderate risk for complications without adequate follow up.  This format is felt to be most appropriate for this patient at this time.  All issues noted in this document were discussed and addressed.  A limited physical exam was performed with this format.  Please refer to the patient's chart for her consent to telehealth for [facility].       Date:  04/21/2020   ID:  Shelly Obrien, DOB 16-Dec-1922, MRN [id number] The patient was identified using 2 identifiers.  Patient Location: Home Provider Location: Home Office  PCP:  [name], MD  Cardiologist:  Mertie Moores, MD  Electrophysiologist:  None   Evaluation Performed:  Follow-Up Visit  Chief Complaint:  Follow-up (CAD, CHF, A. fib)    Patient Profile: Shelly Obrien is a 86 y.o. female with:  Coronary artery disease   Hx of PTCA in 1994  Persistent Atrial fibrillation   Admx 07/2019 >> s/p DCCV >> ERAF  Previously on Amiodarone >> self DC'd CHA2DS2-VASc Score = 5 >> Apixaban    Rate control strategy   Heart failure with reduced ejection fraction   Echocardiogram 2/21: EF 25-30  Conservative mgmt; No inv ischemic eval   Chronic kidney disease   Hypertension   Hyperlipidemia   Prior CV Studies: Echocardiogram 07/13/19 EF 25-30, diff HK w inf-sept AK, normal RVSF, RVSP 59.9, mild MR   History of Present Illness:   Shelly Obrien was last seen by Dr. Acie Fredrickson in 5/21.  She is seen for follow up.  Her son also joined in on the video call.  The patient fell and broke her hip in August.  I reviewed the discharge notes.  It appears that she had worsening renal function and low blood pressures.  Her  metoprolol was stopped.  Amiodarone was used for rate control.  She was discharged home on amiodarone 200 mg twice daily.  It appears that her Apixaban was adjusted due to worsening renal function.  She is under the impression that her primary care provider decreased her Apixaban.  She is describing a rash that improved when the dose was reduced from 5 to 2.5 mg.  Her most recent creatinine that I can find was 1.36.  Given her weight being >60 kg, she should be on a dose of 5 mg twice daily.  Overall, she has been doing well since she came home from the hospital after her hip fracture.  She has not had chest pain, significant shortness of breath, syncope, orthopnea or leg swelling.  She has not had any bleeding issues.  She is not aware of what her blood pressure is.  Past Medical History:  Diagnosis Date  . Aortic ectasia ([place])   . Arm fracture 2013   right, Dr. Sharen Heck  . B12 deficiency   . CAD S/P percutaneous coronary angioplasty    hx MI 04/ 1994  s/p  PCI to LAD  . CKD (chronic kidney disease), stage III   . Complication of anesthesia    hard to wake   . Gallstones   . History of basal cell carcinoma (BCC) excision    right eye area 08/ 2013  . History of kidney stones 01/20/2017  .  History of MI (myocardial infarction) 09/1992   s/p  PCI to LAD  . History of squamous cell carcinoma excision    right lower leg 2015 ;  2016  . Hx of colonic polyps   . Hyperlipidemia   . Hypertension   . Macular degeneration   . Osteoporosis   . PAF (paroxysmal atrial fibrillation) ([place]) dx 02-24-2016   followed by cardiologist-- dr Mamie Nick. [name]  . Pernicious anemia   . PONV (postoperative nausea and vomiting)   . Rib fracture 07/2018   left  . Right ureteral stone   . Wears glasses    Past Surgical History:  Procedure Laterality Date  . ABDOMINAL HYSTERECTOMY    . BREAST EXCISIONAL BIOPSY Left 11/02/2009   fibroadenoma (lumpectomy)  . BROW LIFT Right 04/07/2014   Procedure: REPAIR OF  ENTROPION AND TRICHIASIS OF RIGHT LOWER EYE LID ;  Surgeon: Theodoro Kos, DO;  Location: [place];  Service: Plastics;  Laterality: Right;  . CARDIOVERSION N/A 07/18/2017   Procedure: CARDIOVERSION;  Surgeon: Skeet Latch, MD;  Location: [place];  Service: Cardiovascular;  Laterality: N/A;  . CARDIOVERSION N/A 07/13/2019   Procedure: CARDIOVERSION;  Surgeon: Josue Hector, MD;  Location: [facility] ENDOSCOPY;  Service: Cardiovascular;  Laterality: N/A;  . CARDIOVERSION N/A 09/03/2019   Procedure: CARDIOVERSION;  Surgeon: Buford Dresser, MD;  Location[facility]COPY;  Service: Cardiovascular;  Laterality: N/A;  . CATARACT EXTRACTION W/ INTRAOCULAR LENS  IMPLANT, BILATERAL    . CHOLECYSTECTOMY N/A 12/06/2017   Procedure: LAPAROSCOPIC CHOLECYSTECTOMY;  Surgeon: Jovita Kussmaul, MD;  Location: [facility];  Service: General;  Laterality: N/A;  . CORONARY ANGIOPLASTY  04/ 1994  dr Lia Foyer   PCI to LAD  . CORONARY ANGIOPLASTY    . CYSTOSCOPY W/ URETERAL STENT PLACEMENT Right 01/20/2017   Procedure: CYSTOSCOPY WITH RIGHT RETROGRADE PYELOGRAM/RIGHT URETERAL STENT PLACEMENT;  Surgeon: Alexis Frock, MD;  Location: [facility];  Service: Urology;  Laterality: Right;  . CYSTOSCOPY WITH RETROGRADE PYELOGRAM, URETEROSCOPY AND STENT PLACEMENT Right 02/15/2017   Procedure: CYSTOSCOPY WITH RETROGRADE PYELOGRAM, URETEROSCOPY, STONE BASKETRY AND STENT REPLACEMENT;  Surgeon: Alexis Frock, MD;  Location: [facility];  Service: Urology;  Laterality: Right;  . CYSTOSCOPY/RETROGRADE/URETEROSCOPY/STONE EXTRACTION WITH BASKET  yrs ago  . DILATION AND CURETTAGE OF UTERUS    . HOLMIUM LASER APPLICATION Right 4/62/7035   Procedure: HOLMIUM LASER APPLICATION;  Surgeon: Alexis Frock, MD;  Location: [facility];  Service: Urology;  Laterality: Right;  . INTRAMEDULLARY (IM) NAIL INTERTROCHANTERIC Right 01/13/2020   Procedure: INTRAMEDULLARY (IM) NAIL INTERTROCHANTRIC;  Surgeon:  Rod Can, MD;  Location: [facility];  Service: Orthopedics;  Laterality: Right;  . KNEE SURGERY     right  . NEUROPLASTY / TRANSPOSITION ULNAR NERVE AT ELBOW Left 02-23-2000    [facility]   and Left Carpal Tunnel Release  . r eyelid cancer    . TONSILLECTOMY    . TRANSTHORACIC ECHOCARDIOGRAM  02/26/2016   moderate focal basal and mild concentric LVH,  ef 55-60%,  akinesis of the basal-midanteroseptal, inferoseptal and apicalinferior myocardium, study not sufficient to evaluation diastolic funciton due to atrial fib./  trivial PR/  mild TR  . UMBILICAL HERNIA REPAIR       Current Meds  Medication Sig  . amiodarone (PACERONE) 200 MG tablet Take 200 mg by mouth 2 (two) times daily.  [name] apixaban (ELIQUIS) 2.5 MG TABS tablet Take 1 tablet (2.5 mg total) by mouth 2 (two) times daily.  . DHA-EPA-Flaxseed Oil-Vitamin E (  THERA TEARS NUTRITION PO) Take 1 drop by mouth daily as needed (watery eyes).  . fluocinonide (LIDEX) 0.05 % external solution Apply 1 application topically as needed (itching).   . furosemide (LASIX) 40 MG tablet Take 40 mg by mouth daily.  . Multiple Vitamins-Minerals (ICAPS) CAPS Take 1 capsule by mouth 2 (two) times daily.   [name] Phenylephrine HCl (NOSE DROPS NA) Place 1 drop into the nose daily as needed (alleriges).  . triamcinolone (KENALOG) 0.025 % cream Apply 1 application topically as needed (itching).   . [DISCONTINUED] furosemide (LASIX) 20 MG tablet Take 1 tablet (20 mg total) by mouth daily.     Allergies:   Antihistamines, chlorpheniramine-type; Penicillins; Contrast media [iodinated diagnostic agents]; Sulfonamide derivatives; Atorvastatin; and Pheniramine   Social History   Tobacco Use  . Smoking status: Former Smoker    Years: 5.00    Types: Cigarettes  . Smokeless tobacco: Never Used  . Tobacco comment: "didn't have the habit that much", just smoked "with the girls"  Vaping Use  . Vaping Use: Never used  Substance Use Topics  . Alcohol use: No     Alcohol/week: 0.0 standard drinks  . Drug use: No     Family Hx: The patient's family history includes Brain cancer in her son; Cancer in an other family member; Coronary artery disease in an other family member; Liver cancer in her daughter. There is no history of Neuropathy.  ROS:   Please see the history of present illness.       Labs/Other Tests and Data Reviewed:    EKG:  No ECG reviewed.  Recent Labs: 07/17/2019: BNP 247.3 08/31/2019: TSH 2.102 10/17/2019: ALT 28 10/20/2019: Magnesium 1.9 01/16/2020: BUN 33; Creatinine, Ser 1.40; Hemoglobin 9.4; Platelets 181; Potassium 5.0; Sodium 133   Recent Lipid Panel Lab Results  Component Value Date/Time   CHOL 166 05/07/2018 10:41 AM   TRIG 87 05/07/2018 10:41 AM   HDL 81 05/07/2018 10:41 AM   CHOLHDL 2.0 05/07/2018 10:41 AM   LDLCALC 68 05/07/2018 10:41 AM    Labs from [place] personally reviewed and interpreted 03/29/2020: Hemoglobin 11.4, creatinine 1.36, K+ 4  Wt Readings from Last 3 Encounters:  01/12/20 167 lb 8.8 oz (76 kg)  11/03/19 167 lb 9.6 oz (76 kg)  10/20/19 155 lb 6.4 oz (70.5 kg)     Risk Assessment/Calculations:     CHA2DS2-VASc Score = 5  This indicates a 7.2% annual risk of stroke. The patient's score is based upon: CHF History: 1 HTN History: 1 Diabetes History: 0 Stroke History: 0 Vascular Disease History: 0 Obrien Score: 2 Gender Score: 1     Objective:    Vital Signs:  There were no vitals taken for this visit.   VITAL SIGNS:  reviewed GEN:  no acute distress EYES:  sclerae anicteric, EOMI - Extraocular Movements Intact RESPIRATORY:  Normal respiratory effort NEURO:  alert and oriented x 3, no obvious focal deficit PSYCH:  normal affect  ASSESSMENT & PLAN:    1. Persistent atrial fibrillation (HCC) When she last saw Dr. Acie Fredrickson in May 2021, the decision was made to keep her on metoprolol for rate control.  She had stopped taking amiodarone due to intolerances.  Now she is back on  amiodarone 200 mg twice a day.  It appears that this was used for rate control.  She was taken off of metoprolol due to hypotension.  Based upon her weight and most recent creatinine, she is on the incorrect dose of Apixaban.  She should be on apixaban 5 mg twice daily.  She is concerned that she had a rash with the higher dose and does not want to increase.  This is quite difficult to manage over a video call.  I think it would be best to bring her into the office for an EKG, get follow-up labs and obtain her primary care provider's most recent office notes.  Her son notes that it is difficult to afford Apixaban.  I will provide her with the names of dabigatran and rivaroxaban.  If her insurance company covers 1 of these better, we can certainly switch.   2. HFrEF (heart failure with reduced ejection fraction) (HCC) EF 25-30.  Ischemic cardiomyopathy.  NYHA IIb-III.  Her volume status seems to be stable.  She has had intolerances to medications in the past.  She is not currently on beta-blocker due to hypotension noted in the hospital.  She is not really certain what her blood pressures have been.  Again, I think it would be better to bring her into the office for an in person visit to work through what medication she should be on.  She is being managed conservatively for her cardiomyopathy.    3. Coronary artery disease involving native coronary artery of native heart without angina pectoris Remote history of PCI in the 1990s.  She does not seem to be having angina.  She is not on aspirin as she is on Apixaban.  4. Essential hypertension As noted, it is not clear what her blood pressures have been.  Continue current therapy.  5. Stage 3b chronic kidney disease ([place]) Most recent creatinine 1.36.  We will obtain follow-up labs when she comes into the office for follow-up and request records from primary care.     Time:   Today, I have spent 15 minutes with the patient with telehealth technology  discussing the above problems.     Medication Adjustments/Labs and Tests Ordered: Current medicines are reviewed at length with the patient today.  Concerns regarding medicines are outlined above.   Tests Ordered: No orders of the defined types were placed in this encounter.   Medication Changes: No orders of the defined types were placed in this encounter.   Follow Up:  In Person in 4 week(s)  Signed, Richardson Dopp, PA-C  04/21/2020 10:50 PM    [facility]

## 2020-04-22 ENCOUNTER — Other Ambulatory Visit: Payer: Self-pay

## 2020-04-22 ENCOUNTER — Telehealth (INDEPENDENT_AMBULATORY_CARE_PROVIDER_SITE_OTHER): Payer: Medicare Other | Admitting: Physician Assistant

## 2020-04-22 ENCOUNTER — Encounter: Payer: Self-pay | Admitting: Physician Assistant

## 2020-04-22 VITALS — Ht 65.0 in | Wt 160.0 lb

## 2020-04-22 DIAGNOSIS — I4819 Other persistent atrial fibrillation: Secondary | ICD-10-CM | POA: Diagnosis not present

## 2020-04-22 DIAGNOSIS — N1832 Chronic kidney disease, stage 3b: Secondary | ICD-10-CM | POA: Diagnosis not present

## 2020-04-22 DIAGNOSIS — I5020 Unspecified systolic (congestive) heart failure: Secondary | ICD-10-CM | POA: Diagnosis not present

## 2020-04-22 DIAGNOSIS — I10 Essential (primary) hypertension: Secondary | ICD-10-CM | POA: Diagnosis not present

## 2020-04-22 DIAGNOSIS — I2510 Atherosclerotic heart disease of native coronary artery without angina pectoris: Secondary | ICD-10-CM | POA: Diagnosis not present

## 2020-04-27 ENCOUNTER — Telehealth: Payer: Self-pay | Admitting: Physician Assistant

## 2020-04-27 NOTE — Telephone Encounter (Signed)
Reviewed recent notes from primary care physician as well as most recent labs. Her Creatinine has been < 1.5 on 2 separate lab tests.   Her weight is > 60 kg.   Therefore, the appropriate dose of Apixaban (Eliquis) is 5 mg twice daily.   Remaining on a dose lower than 5 mg twice daily increases her risk of stroke.   I reviewed her hospital records. The Apixaban was reduced during her hospital stay due to an elevated Creatinine in the setting of low blood pressure.  This was all related to her fall and broken hip.   Now that she has recovered, her creatinine has improved.  Therefore, she should be on Apixaban 5 mg twice daily.    She should only be on Amiodarone 200 mg once daily.    It looks like she does not have f/u until Jan. Since it is going to be 2 mos before she comes in to be seen, I think we should have her get a Zio monitor to assess HR control.   PLAN:  1. Increase Apixaban (Eliquis) 5 mg twice daily  2. Decrease Amiodarone to 200 mg once daily  3. 72 hour Zio patch (XT) [Dx: Afib]  Richardson Dopp, PA-C    04/27/2020 4:57 PM

## 2020-04-27 NOTE — Telephone Encounter (Signed)
Left message on patient's voicemail and also on voicemail for [name] (on [facility]) to call office

## 2020-05-03 NOTE — Telephone Encounter (Signed)
Left message on pts home voicemail and for [name] on her DPR.

## 2020-05-04 NOTE — Telephone Encounter (Signed)
PLAN:  1. Increase Apixaban (Eliquis) 5 mg twice daily  2. Decrease Amiodarone to 200 mg once daily  3. 72 hour Zio patch (XT) [Dx: Afib]  Shelly Dopp, PA-C    04/27/2020 4:57 PM     Contacted the patient regarding the above results. She states that she does not want to increase her Eliquis because she has been having a rash on her arms. She states that it has been waking up at night due to her arms itching. She reports the rash turns into sores. She states that this has been ongoing since she began taking Eliquis approximately one year ago. Patient states she has been to a dermatologist to have this evaluated but no solution was found. Patient believes it is her Eliquis causing this rash and she is afraid of increasing her dose.  She is agreeable to decreasing Amiodarone to 200 mg once daily.  Patient is wondering if a Zio patch is really necessary, she states she has felt fine and not had any cardiac symptoms. She states that she would prefer not to wear the monitor unless she has to.   Multiple messages have been left on DPR, patients son-Shelly Obrien's voicemail requesting a call back to discuss the patients care.

## 2020-05-04 NOTE — Telephone Encounter (Signed)
Apixaban (Eliquis) is not likely causing the rash.  If she is willing to switch, we can change her Apixaban to Rivaroxaban. Her Cr Cl is 28.  So, her dose of Rivaroxaban (Xarelto) is 15 mg once daily.  Instead of obtaining a monitor, we can bring her in to a nurse visit and get an ECG to assess HR control.  Richardson Dopp, PA-C    05/04/2020 2:16 PM

## 2020-05-05 ENCOUNTER — Ambulatory Visit: Payer: Medicare Other | Admitting: Physician Assistant

## 2020-05-05 MED ORDER — RIVAROXABAN 15 MG PO TABS: 15.0000 mg | ORAL_TABLET | Freq: Every day | ORAL | 6 refills | Status: DC

## 2020-05-05 NOTE — Telephone Encounter (Signed)
I called and spoke with patient, she is agreeable to changing Eliquis to Xarelto. New prescription for Xarelto 15 mg sent to patients pharmacy, Eliquis stopped and removed from medication list. Patient states that she does not want to wear a heart rate monitor nor does she want to come in for an EKG. Patient states that she is busy and is leaving on 05/18/20 to go to her daughters in [place] and will not be back until after Christmas. She states that we can do an EKG when she comes in to see Richardson Dopp on 06/17/20.

## 2020-05-05 NOTE — Telephone Encounter (Signed)
Pt should check her pulse once a day.  If > 100 or too fast to count, she should call us right away. She should count her pulse for a full minute. Richardson Dopp, PA-C    05/05/2020 2:20 PM

## 2020-05-10 NOTE — Telephone Encounter (Signed)
I called and spoke with patient she is aware to check her pulse once a day, patient will call if heart rate is over 100 or too fast to count.

## 2020-05-18 DIAGNOSIS — H353211 Exudative age-related macular degeneration, right eye, with active choroidal neovascularization: Secondary | ICD-10-CM | POA: Diagnosis not present

## 2020-06-10 ENCOUNTER — Other Ambulatory Visit: Payer: Self-pay

## 2020-06-10 ENCOUNTER — Inpatient Hospital Stay (HOSPITAL_COMMUNITY)
Admission: EM | Admit: 2020-06-10 | Discharge: 2020-07-05 | DRG: 291 | Disposition: E | Payer: Medicare Other | Attending: Internal Medicine | Admitting: Internal Medicine

## 2020-06-10 ENCOUNTER — Encounter (HOSPITAL_COMMUNITY): Admission: EM | Disposition: E | Payer: Self-pay | Source: Home / Self Care | Attending: Internal Medicine

## 2020-06-10 ENCOUNTER — Inpatient Hospital Stay (HOSPITAL_COMMUNITY): Payer: Medicare Other

## 2020-06-10 ENCOUNTER — Emergency Department (HOSPITAL_COMMUNITY): Payer: Medicare Other

## 2020-06-10 ENCOUNTER — Encounter (HOSPITAL_COMMUNITY): Payer: Self-pay | Admitting: Emergency Medicine

## 2020-06-10 DIAGNOSIS — Z79899 Other long term (current) drug therapy: Secondary | ICD-10-CM | POA: Diagnosis not present

## 2020-06-10 DIAGNOSIS — J90 Pleural effusion, not elsewhere classified: Secondary | ICD-10-CM | POA: Diagnosis present

## 2020-06-10 DIAGNOSIS — I213 ST elevation (STEMI) myocardial infarction of unspecified site: Secondary | ICD-10-CM | POA: Diagnosis not present

## 2020-06-10 DIAGNOSIS — Z85828 Personal history of other malignant neoplasm of skin: Secondary | ICD-10-CM

## 2020-06-10 DIAGNOSIS — R9431 Abnormal electrocardiogram [ECG] [EKG]: Secondary | ICD-10-CM | POA: Diagnosis not present

## 2020-06-10 DIAGNOSIS — I4891 Unspecified atrial fibrillation: Secondary | ICD-10-CM | POA: Diagnosis not present

## 2020-06-10 DIAGNOSIS — Z20822 Contact with and (suspected) exposure to covid-19: Secondary | ICD-10-CM | POA: Diagnosis not present

## 2020-06-10 DIAGNOSIS — D472 Monoclonal gammopathy: Secondary | ICD-10-CM | POA: Diagnosis present

## 2020-06-10 DIAGNOSIS — Z9841 Cataract extraction status, right eye: Secondary | ICD-10-CM

## 2020-06-10 DIAGNOSIS — I519 Heart disease, unspecified: Secondary | ICD-10-CM | POA: Insufficient documentation

## 2020-06-10 DIAGNOSIS — N179 Acute kidney failure, unspecified: Secondary | ICD-10-CM

## 2020-06-10 DIAGNOSIS — J918 Pleural effusion in other conditions classified elsewhere: Secondary | ICD-10-CM | POA: Diagnosis present

## 2020-06-10 DIAGNOSIS — R7989 Other specified abnormal findings of blood chemistry: Secondary | ICD-10-CM | POA: Diagnosis present

## 2020-06-10 DIAGNOSIS — I482 Chronic atrial fibrillation, unspecified: Secondary | ICD-10-CM | POA: Diagnosis present

## 2020-06-10 DIAGNOSIS — Z880 Allergy status to penicillin: Secondary | ICD-10-CM

## 2020-06-10 DIAGNOSIS — R54 Age-related physical debility: Secondary | ICD-10-CM | POA: Diagnosis present

## 2020-06-10 DIAGNOSIS — I2510 Atherosclerotic heart disease of native coronary artery without angina pectoris: Secondary | ICD-10-CM | POA: Diagnosis present

## 2020-06-10 DIAGNOSIS — R778 Other specified abnormalities of plasma proteins: Secondary | ICD-10-CM

## 2020-06-10 DIAGNOSIS — Z808 Family history of malignant neoplasm of other organs or systems: Secondary | ICD-10-CM

## 2020-06-10 DIAGNOSIS — Z7189 Other specified counseling: Secondary | ICD-10-CM | POA: Diagnosis not present

## 2020-06-10 DIAGNOSIS — I255 Ischemic cardiomyopathy: Secondary | ICD-10-CM | POA: Diagnosis not present

## 2020-06-10 DIAGNOSIS — R262 Difficulty in walking, not elsewhere classified: Secondary | ICD-10-CM | POA: Diagnosis present

## 2020-06-10 DIAGNOSIS — I700 Atherosclerosis of aorta: Secondary | ICD-10-CM | POA: Diagnosis present

## 2020-06-10 DIAGNOSIS — Z7901 Long term (current) use of anticoagulants: Secondary | ICD-10-CM

## 2020-06-10 DIAGNOSIS — Z9889 Other specified postprocedural states: Secondary | ICD-10-CM | POA: Diagnosis not present

## 2020-06-10 DIAGNOSIS — R7303 Prediabetes: Secondary | ICD-10-CM | POA: Diagnosis present

## 2020-06-10 DIAGNOSIS — Z9861 Coronary angioplasty status: Secondary | ICD-10-CM | POA: Diagnosis not present

## 2020-06-10 DIAGNOSIS — Z90710 Acquired absence of both cervix and uterus: Secondary | ICD-10-CM

## 2020-06-10 DIAGNOSIS — Z789 Other specified health status: Secondary | ICD-10-CM | POA: Diagnosis not present

## 2020-06-10 DIAGNOSIS — I252 Old myocardial infarction: Secondary | ICD-10-CM

## 2020-06-10 DIAGNOSIS — J9602 Acute respiratory failure with hypercapnia: Secondary | ICD-10-CM | POA: Diagnosis present

## 2020-06-10 DIAGNOSIS — I110 Hypertensive heart disease with heart failure: Secondary | ICD-10-CM | POA: Diagnosis not present

## 2020-06-10 DIAGNOSIS — Z9842 Cataract extraction status, left eye: Secondary | ICD-10-CM

## 2020-06-10 DIAGNOSIS — M810 Age-related osteoporosis without current pathological fracture: Secondary | ICD-10-CM | POA: Diagnosis present

## 2020-06-10 DIAGNOSIS — I130 Hypertensive heart and chronic kidney disease with heart failure and stage 1 through stage 4 chronic kidney disease, or unspecified chronic kidney disease: Secondary | ICD-10-CM | POA: Diagnosis not present

## 2020-06-10 DIAGNOSIS — Z91041 Radiographic dye allergy status: Secondary | ICD-10-CM

## 2020-06-10 DIAGNOSIS — E785 Hyperlipidemia, unspecified: Secondary | ICD-10-CM | POA: Diagnosis present

## 2020-06-10 DIAGNOSIS — J9601 Acute respiratory failure with hypoxia: Secondary | ICD-10-CM

## 2020-06-10 DIAGNOSIS — Z882 Allergy status to sulfonamides status: Secondary | ICD-10-CM

## 2020-06-10 DIAGNOSIS — Z7982 Long term (current) use of aspirin: Secondary | ICD-10-CM

## 2020-06-10 DIAGNOSIS — N1832 Chronic kidney disease, stage 3b: Secondary | ICD-10-CM | POA: Diagnosis present

## 2020-06-10 DIAGNOSIS — I4811 Longstanding persistent atrial fibrillation: Secondary | ICD-10-CM | POA: Diagnosis not present

## 2020-06-10 DIAGNOSIS — I509 Heart failure, unspecified: Secondary | ICD-10-CM | POA: Diagnosis not present

## 2020-06-10 DIAGNOSIS — D649 Anemia, unspecified: Secondary | ICD-10-CM | POA: Diagnosis not present

## 2020-06-10 DIAGNOSIS — Z800 Family history of malignant neoplasm of digestive organs: Secondary | ICD-10-CM

## 2020-06-10 DIAGNOSIS — I219 Acute myocardial infarction, unspecified: Secondary | ICD-10-CM | POA: Diagnosis present

## 2020-06-10 DIAGNOSIS — I454 Nonspecific intraventricular block: Secondary | ICD-10-CM | POA: Diagnosis not present

## 2020-06-10 DIAGNOSIS — Z87891 Personal history of nicotine dependence: Secondary | ICD-10-CM

## 2020-06-10 DIAGNOSIS — D631 Anemia in chronic kidney disease: Secondary | ICD-10-CM | POA: Diagnosis present

## 2020-06-10 DIAGNOSIS — R269 Unspecified abnormalities of gait and mobility: Secondary | ICD-10-CM | POA: Diagnosis present

## 2020-06-10 DIAGNOSIS — J948 Other specified pleural conditions: Secondary | ICD-10-CM | POA: Diagnosis not present

## 2020-06-10 DIAGNOSIS — I214 Non-ST elevation (NSTEMI) myocardial infarction: Secondary | ICD-10-CM

## 2020-06-10 DIAGNOSIS — R079 Chest pain, unspecified: Secondary | ICD-10-CM | POA: Diagnosis not present

## 2020-06-10 DIAGNOSIS — I5023 Acute on chronic systolic (congestive) heart failure: Secondary | ICD-10-CM | POA: Diagnosis present

## 2020-06-10 DIAGNOSIS — Z8719 Personal history of other diseases of the digestive system: Secondary | ICD-10-CM

## 2020-06-10 DIAGNOSIS — R0602 Shortness of breath: Secondary | ICD-10-CM | POA: Diagnosis not present

## 2020-06-10 DIAGNOSIS — Z9049 Acquired absence of other specified parts of digestive tract: Secondary | ICD-10-CM

## 2020-06-10 DIAGNOSIS — Z66 Do not resuscitate: Secondary | ICD-10-CM | POA: Diagnosis present

## 2020-06-10 DIAGNOSIS — E538 Deficiency of other specified B group vitamins: Secondary | ICD-10-CM | POA: Diagnosis present

## 2020-06-10 DIAGNOSIS — Z515 Encounter for palliative care: Secondary | ICD-10-CM

## 2020-06-10 DIAGNOSIS — Z888 Allergy status to other drugs, medicaments and biological substances status: Secondary | ICD-10-CM

## 2020-06-10 DIAGNOSIS — E875 Hyperkalemia: Secondary | ICD-10-CM | POA: Diagnosis not present

## 2020-06-10 DIAGNOSIS — I5021 Acute systolic (congestive) heart failure: Secondary | ICD-10-CM | POA: Diagnosis not present

## 2020-06-10 DIAGNOSIS — R5381 Other malaise: Secondary | ICD-10-CM | POA: Diagnosis present

## 2020-06-10 DIAGNOSIS — Z87442 Personal history of urinary calculi: Secondary | ICD-10-CM

## 2020-06-10 DIAGNOSIS — R0789 Other chest pain: Secondary | ICD-10-CM | POA: Diagnosis not present

## 2020-06-10 DIAGNOSIS — I4821 Permanent atrial fibrillation: Secondary | ICD-10-CM | POA: Insufficient documentation

## 2020-06-10 DIAGNOSIS — R7302 Impaired glucose tolerance (oral): Secondary | ICD-10-CM | POA: Diagnosis present

## 2020-06-10 DIAGNOSIS — Z961 Presence of intraocular lens: Secondary | ICD-10-CM | POA: Diagnosis present

## 2020-06-10 DIAGNOSIS — Z8249 Family history of ischemic heart disease and other diseases of the circulatory system: Secondary | ICD-10-CM

## 2020-06-10 DIAGNOSIS — J811 Chronic pulmonary edema: Secondary | ICD-10-CM | POA: Diagnosis present

## 2020-06-10 DIAGNOSIS — N189 Chronic kidney disease, unspecified: Secondary | ICD-10-CM | POA: Diagnosis not present

## 2020-06-10 LAB — ECHOCARDIOGRAM COMPLETE
Height: 65 in
MV M vel: 2.45 m/s
MV Peak grad: 24 mmHg
Weight: 2160 oz

## 2020-06-10 LAB — I-STAT CHEM 8, ED
BUN: 40 mg/dL — ABNORMAL HIGH (ref 8–23)
Calcium, Ion: 1.19 mmol/L (ref 1.15–1.40)
Chloride: 106 mmol/L (ref 98–111)
Creatinine, Ser: 2.4 mg/dL — ABNORMAL HIGH (ref 0.44–1.00)
Glucose, Bld: 105 mg/dL — ABNORMAL HIGH (ref 70–99)
HCT: 39 % (ref 36.0–46.0)
Hemoglobin: 13.3 g/dL (ref 12.0–15.0)
Potassium: 5.1 mmol/L (ref 3.5–5.1)
Sodium: 143 mmol/L (ref 135–145)
TCO2: 28 mmol/L (ref 22–32)

## 2020-06-10 LAB — PROTIME-INR
INR: 1.8 — ABNORMAL HIGH (ref 0.8–1.2)
Prothrombin Time: 20.5 seconds — ABNORMAL HIGH (ref 11.4–15.2)

## 2020-06-10 LAB — HEMOGLOBIN A1C
Hgb A1c MFr Bld: 6 % — ABNORMAL HIGH (ref 4.8–5.6)
Mean Plasma Glucose: 125.5 mg/dL

## 2020-06-10 LAB — CBC
HCT: 38 % (ref 36.0–46.0)
Hemoglobin: 11.5 g/dL — ABNORMAL LOW (ref 12.0–15.0)
MCH: 26 pg (ref 26.0–34.0)
MCHC: 30.3 g/dL (ref 30.0–36.0)
MCV: 86 fL (ref 80.0–100.0)
Platelets: 275 10*3/uL (ref 150–400)
RBC: 4.42 MIL/uL (ref 3.87–5.11)
RDW: 20.7 % — ABNORMAL HIGH (ref 11.5–15.5)
WBC: 8.8 10*3/uL (ref 4.0–10.5)
nRBC: 0 % (ref 0.0–0.2)

## 2020-06-10 LAB — COMPREHENSIVE METABOLIC PANEL
ALT: 31 U/L (ref 0–44)
AST: 83 U/L — ABNORMAL HIGH (ref 15–41)
Albumin: 3.3 g/dL — ABNORMAL LOW (ref 3.5–5.0)
Alkaline Phosphatase: 75 U/L (ref 38–126)
Anion gap: 12 (ref 5–15)
BUN: 27 mg/dL — ABNORMAL HIGH (ref 8–23)
CO2: 25 mmol/L (ref 22–32)
Calcium: 9.7 mg/dL (ref 8.9–10.3)
Chloride: 106 mmol/L (ref 98–111)
Creatinine, Ser: 2.52 mg/dL — ABNORMAL HIGH (ref 0.44–1.00)
GFR, Estimated: 17 mL/min — ABNORMAL LOW (ref >60)
Glucose, Bld: 124 mg/dL — ABNORMAL HIGH (ref 70–99)
Potassium: 5.1 mmol/L (ref 3.5–5.1)
Sodium: 143 mmol/L (ref 135–145)
Total Bilirubin: 1.4 mg/dL — ABNORMAL HIGH (ref 0.3–1.2)
Total Protein: 6.6 g/dL (ref 6.5–8.1)

## 2020-06-10 LAB — LIPID PANEL
Cholesterol: 144 mg/dL (ref 0–200)
HDL: 72 mg/dL (ref >40)
LDL Cholesterol: 58 mg/dL (ref 0–99)
Total CHOL/HDL Ratio: 2 RATIO
Triglycerides: 68 mg/dL (ref <150)
VLDL: 14 mg/dL (ref 0–40)

## 2020-06-10 LAB — RESP PANEL BY RT-PCR (FLU A&B, COVID) ARPGX2
Influenza A by PCR: NEGATIVE
Influenza B by PCR: NEGATIVE
SARS Coronavirus 2 by RT PCR: NEGATIVE

## 2020-06-10 LAB — TROPONIN I (HIGH SENSITIVITY)
Troponin I (High Sensitivity): 12241 ng/L (ref <18)
Troponin I (High Sensitivity): 12017 ng/L (ref <18)

## 2020-06-10 LAB — BRAIN NATRIURETIC PEPTIDE: B Natriuretic Peptide: 2431.5 pg/mL — ABNORMAL HIGH (ref 0.0–100.0)

## 2020-06-10 LAB — APTT
aPTT: 32 seconds (ref 24–36)
aPTT: 52 seconds — ABNORMAL HIGH (ref 24–36)

## 2020-06-10 SURGERY — CORONARY/GRAFT ACUTE MI REVASCULARIZATION
Anesthesia: LOCAL

## 2020-06-10 MED ORDER — ONDANSETRON HCL 4 MG/2ML IJ SOLN: 4.0000 mg | Freq: Four times a day (QID) | INTRAMUSCULAR | Status: DC | PRN

## 2020-06-10 MED ORDER — HEPARIN (PORCINE) 25000 UT/250ML-% IV SOLN
1050.0000 [IU]/h | INTRAVENOUS | Status: DC
  Administered 2020-06-10: 800 [IU]/h via INTRAVENOUS
  Administered 2020-06-11: 900 [IU]/h via INTRAVENOUS
  Filled 2020-06-10 (×4): qty 250

## 2020-06-10 MED ORDER — SODIUM CHLORIDE 0.9% FLUSH
3.0000 mL | Freq: Two times a day (BID) | INTRAVENOUS | Status: DC
  Administered 2020-06-10 – 2020-06-13 (×7): 3 mL via INTRAVENOUS

## 2020-06-10 MED ORDER — POLYVINYL ALCOHOL 1.4 % OP SOLN
1.0000 [drp] | Freq: Two times a day (BID) | OPHTHALMIC | Status: DC | PRN
  Administered 2020-06-10 – 2020-06-12 (×2): 1 [drp] via OPHTHALMIC
  Filled 2020-06-10: qty 15

## 2020-06-10 MED ORDER — SODIUM CHLORIDE 0.9 % IV SOLN: 250.0000 mL | INTRAVENOUS | Status: DC | PRN

## 2020-06-10 MED ORDER — FUROSEMIDE 10 MG/ML IJ SOLN
40.0000 mg | Freq: Two times a day (BID) | INTRAMUSCULAR | Status: DC
  Administered 2020-06-10: 40 mg via INTRAVENOUS
  Filled 2020-06-10 (×2): qty 4

## 2020-06-10 MED ORDER — HEPARIN SODIUM (PORCINE) 1000 UNIT/ML IJ SOLN
4000.0000 [IU] | Freq: Once | INTRAMUSCULAR | Status: AC
  Administered 2020-06-10: 4000 [IU] via INTRAVENOUS

## 2020-06-10 MED ORDER — ACETAMINOPHEN 325 MG PO TABS
650.0000 mg | ORAL_TABLET | ORAL | Status: DC | PRN
  Administered 2020-06-10: 650 mg via ORAL
  Filled 2020-06-10: qty 2

## 2020-06-10 MED ORDER — SODIUM CHLORIDE 0.9% FLUSH: 3.0000 mL | INTRAVENOUS | Status: DC | PRN

## 2020-06-10 MED ORDER — HEPARIN SODIUM (PORCINE) 5000 UNIT/ML IJ SOLN
60.0000 [IU]/kg | Freq: Once | INTRAMUSCULAR | Status: DC
  Filled 2020-06-10: qty 1

## 2020-06-10 MED ORDER — POLYETHYL GLYCOL-PROPYL GLYCOL 0.4-0.3 % OP SOLN: 1.0000 [drp] | Freq: Two times a day (BID) | OPHTHALMIC | Status: DC | PRN

## 2020-06-10 MED ORDER — FUROSEMIDE 10 MG/ML IJ SOLN
40.0000 mg | Freq: Once | INTRAMUSCULAR | Status: AC
  Administered 2020-06-10: 40 mg via INTRAVENOUS
  Filled 2020-06-10: qty 4

## 2020-06-10 MED ORDER — AMIODARONE HCL 200 MG PO TABS
200.0000 mg | ORAL_TABLET | Freq: Two times a day (BID) | ORAL | Status: DC
  Administered 2020-06-10 – 2020-06-13 (×7): 200 mg via ORAL
  Filled 2020-06-10 (×8): qty 1

## 2020-06-10 MED ORDER — PERFLUTREN LIPID MICROSPHERE
1.0000 mL | INTRAVENOUS | Status: AC | PRN
  Administered 2020-06-10: 2 mL via INTRAVENOUS
  Filled 2020-06-10: qty 10

## 2020-06-10 MED ORDER — SODIUM CHLORIDE 0.9 % IV SOLN
INTRAVENOUS | Status: DC
Start: 2020-06-10 — End: 2020-06-10

## 2020-06-10 MED ORDER — ASPIRIN EC 81 MG PO TBEC
81.0000 mg | DELAYED_RELEASE_TABLET | Freq: Every day | ORAL | Status: DC
  Administered 2020-06-11 – 2020-06-12 (×2): 81 mg via ORAL
  Filled 2020-06-10 (×3): qty 1

## 2020-06-10 NOTE — Progress Notes (Addendum)
Interventional cardiology note:  Called to assess patient for possible STEMI.   She is 87 yo WF with history of persistent AFib on Eliquis, CKD, systolic CHF with EF 81-44% at baseline, HTN, HLD, and remote PTCA of the LAD in 1994 during an MI. She had a fractured hip in August 2021 treated with intramedullary fixation. Her functional status is poor - she gets around her house in a wheelchair.  She presents tonight with complaints of worsening SOB for one week. Tonight she also developed chest pain that appears to be worse with inspiration. Ecg done by EMS and in the ED shows AFib with controlled rate. There is an interventricular conduction delay. Compared to old Ecgs there is ST elevation in V4-5. The patient notes that she did take her Eliquis tonight.   On exam she is frail appearing and appears SOB She has JVD to the angle of the jaw.  Diffuse pulmonary rales with poor BS on the right. CV IRR soft SEM RUSB.  Abd soft and NT 2+ edema  CXR shows diffuse pulmonary edema with significant right pleural effusion. Hgb 11.5. Other labs are pending  Impression:  Patient presents predominantly with acute on chronic combined systolic/diastolic CHF. She has significant pulmonary edema and right pleural effusion. She has atypical chest pain. By Ecg criteria alone it is possible she could be having a STEMI with elevation in V 4-5 but this is uncertain with underlying IVCD.  At any rate she is a poor candidate for emergent invasive evaluation with cardiac cath/PCI.  This is based on her advanced age, poor functional status, CKD, AFib, current anticoagulant use, Low EF and decompensated CHF.  She informs me that Dr Acie Fredrickson has told her in the past she is not a candidate for cardiac cath.  She is a DNR. I discussed with on call cardiology team  I recommend medical management with IV diuresis, IV Ntg as tolerated.

## 2020-06-10 NOTE — Progress Notes (Signed)
ANTICOAGULATION CONSULT NOTE - Initial Consult  Pharmacy Consult for heparin Indication: chest pain/ACS  Allergies  Allergen Reactions  . Antihistamines, Chlorpheniramine-Type Other (See Comments)    Pt states that she passed out.   [name] Penicillins Anaphylaxis and Other (See Comments)    Has patient had a PCN reaction causing immediate rash, facial/tongue/throat swelling, SOB or lightheadedness with hypotension: Yes Has patient had a PCN reaction causing severe rash involving mucus membranes or skin necrosis: No Has patient had a PCN reaction that required hospitalization No Has patient had a PCN reaction occurring within the last 10 years: No If all of the above answers are "NO", then may proceed with Cephalosporin use.  . Contrast Media [Iodinated Diagnostic Agents] Hives  . Sulfonamide Derivatives Other (See Comments)    Reaction:  Unknown   . Atorvastatin Other (See Comments)    myalgias  . Pheniramine Other (See Comments)    Pt states that she passed out.   . Sulfa Antibiotics Rash    Patient Measurements: Height: 5\' 5"  (165.1 cm) Weight: 61.2 kg (135 lb) IBW/kg (Calculated) : 57 Heparin Dosing Weight: 61.2 kg   Vital Signs: Temp: 97.3 F (36.3 C) (01/07 0340) Temp Source: Oral (01/07 0340) BP: 124/97 (01/07 0615) Pulse Rate: 40 (01/07 0615)  Labs: Recent Labs    06/12/2020 0329 06/18/2020 0535 06/14/2020 0554  HGB 11.5*  --  13.3  HCT 38.0  --  39.0  PLT 275  --   --   APTT 32  --   --   LABPROT 20.5*  --   --   INR 1.8*  --   --   CREATININE 2.52*  --  2.40*  TROPONINIHS 12,241* 12,017*  --     Estimated Creatinine Clearance: 12.6 mL/min (A) (by C-G formula based on SCr of 2.4 mg/dL (H)).   Medical History: Past Medical History:  Diagnosis Date  . Aortic ectasia ([place])   . Arm fracture 2013   right, Dr. Sharen Heck  . B12 deficiency   . CAD S/P percutaneous coronary angioplasty    hx MI 04/ 1994  s/p  PCI to LAD  . CKD (chronic kidney disease), stage III  ([place])   . Complication of anesthesia    hard to wake   . Gallstones   . History of basal cell carcinoma (BCC) excision    right eye area 08/ 2013  . History of kidney stones 01/20/2017  . History of MI (myocardial infarction) 09/1992   s/p  PCI to LAD  . History of squamous cell carcinoma excision    right lower leg 2015 ;  2016  . Hx of colonic polyps   . Hyperlipidemia   . Hypertension   . Macular degeneration   . Osteoporosis   . PAF (paroxysmal atrial fibrillation) ([place]) dx 02-24-2016   followed by cardiologist-- dr Mamie Nick. [name]  . Pernicious anemia   . PONV (postoperative nausea and vomiting)   . Rib fracture 07/2018   left  . Right ureteral stone   . Wears glasses     Medications:  (Not in a hospital admission)   Assessment: 50 YOF who presents with chest pain to start IV heparin for ACS/STEMI. Planning medical management given her advanced age. [name], patient is on apixaban at home and last dose was yesterday evening. She also received a bolus dose of IV heparin 4000 units this morning at 0338.   H/H and Plt wnl. SCr 2.4 (BL ~ 1.4)   Goal of  Therapy:  Heparin level 0.3-0.7 units/ml aPTT 66-102 seconds Monitor platelets by anticoagulation protocol: Yes   Plan:  -Start IV heparin at 800 units/hr -F/u 8 hr aPTT -Monitor daily aPTT, HL, CBC and s/s of bleeding.   Albertina Parr, PharmD., BCPS, BCCCP Clinical Pharmacist Please refer to [facility] for unit-specific pharmacist

## 2020-06-10 NOTE — ED Notes (Addendum)
Assumed care on patient, resting with no pain , respirations unlabored , Heparin drip infusing at 800 units/hr. IV sites unremarkable , patient waiting for in-patient progressive bed assignment .

## 2020-06-10 NOTE — ED Notes (Signed)
Pt transferred to hospital bed for comfort.

## 2020-06-10 NOTE — ED Notes (Signed)
Son at bedside talking with cardiology

## 2020-06-10 NOTE — H&P (Addendum)
History and Physical    Shelly Obrien [id number] DOB: Aug 07, 1922 DOA: 06/18/2020  Referring MD/NP/PA: Gean Birchwood, MD PCP: [name], MD  Consultants: Mertie Moores, MD (cardiology) Patient coming from: Home(lives alone) via EMS  Chief Complaint: Chest pain  I have personally briefly reviewed patient's old medical records in [place]   HPI: Shelly Obrien is a 87 y.o. female with medical history significant of HTN, HLD, CAD S/P PCI in 60', A. fib on Eliquis, systolic CHF, last EF [id number], and CKD stage III resident with complaints of chest pain waking her out of her sleep this morning.  At baseline patient currently lives alone and gets around with use of a walker and a wheelchair.  Chest pain was described as pressure nature with some radiation to her back.  Associated symptoms included shortness of breath which has worsened over the last week, nonproductive cough, fatigue, lower extremity swelling, and weight loss with a few pounds. Denies having any change in appetite, nausea, vomiting, abdominal pain, diarrhea, dysuria, loss of consciousness, or recent falls.  Patient reports that she has been taking all of her medications as prescribed and has not missed any doses.  Sitting up this morning helped a little bit with her shortness of breath symptoms  She had suffered a right femur fracture back in August 2021 treated with intramedullary fixation.  She spent about 30 days in rehab prior to going back home.  Since being home she reports that she had noticed some shortness of breath with exertion.  She usually uses B wheelchair while in the kitchen, but if she has to go across the carpet in the living room with her wheelchair it can make her very short of breath to the point that she has to stop and rest.  Normally patient uses her walker on the carpet.  She has home health care nurse that comes and checks on her.  In route with EMS patient was given 1 sublingual nitroglycerin and  324 mg of aspirin.  EMS reported that blood pressure dropped 40 points with nitroglycerin from the 150's/90's down to the 110's/60's.  Patient was initially called in as a code STEMI, but was canceled by cardiology.  ED Course: .  Upon admission into the emergency department patient was seen to be afebrile, pulse 40-109, O2 saturations 86-98% on 2 L of [place], and all other vital signs maintained.  EKG revealed signs of ST elevation in V4-5.  Cardiology have been consulted to evaluated the patient and code STEMI was recalled.  Labs significant for hemoglobin 11.5, BUN 27, creatinine 2.52, hemoglobin A1c 6, LDL58, high-sensitivity troponin 12,241-> 12,017.  Influenza and COVID-19 screening were negative.  Chest x-ray revealed diffuse interstitial pulmonary infiltrate and bilateral pleural effusions concerning for heart failure.  Cardiology recommended medical management given age, acute kidney injury, and other comorbidities.  Review of Systems  Constitutional: Positive for malaise/fatigue and weight loss. Negative for fever.  HENT: Negative for congestion and nosebleeds.   Eyes: Negative for photophobia and pain.  Respiratory: Positive for cough and shortness of breath.   Cardiovascular: Positive for chest pain and leg swelling.  Gastrointestinal: Negative for abdominal pain, diarrhea, nausea and vomiting.  Genitourinary: Negative for dysuria and hematuria.  Musculoskeletal: Positive for joint pain. Negative for falls.  Skin: Negative for itching and rash.  Neurological: Positive for weakness. Negative for focal weakness and loss of consciousness.  Endo/Heme/Allergies: Negative for polydipsia.  Psychiatric/Behavioral: Negative for substance abuse. The patient has insomnia.  Past Medical History:  Diagnosis Date  . Aortic ectasia ([place])   . Arm fracture 2013   right, Dr. Sharen Heck  . B12 deficiency   . CAD S/P percutaneous coronary angioplasty    hx MI 04/ 1994  s/p  PCI to LAD  . CKD (chronic  kidney disease), stage III ([place])   . Complication of anesthesia    hard to wake   . Gallstones   . History of basal cell carcinoma (BCC) excision    right eye area 08/ 2013  . History of kidney stones 01/20/2017  . History of MI (myocardial infarction) 09/1992   s/p  PCI to LAD  . History of squamous cell carcinoma excision    right lower leg 2015 ;  2016  . Hx of colonic polyps   . Hyperlipidemia   . Hypertension   . Macular degeneration   . Osteoporosis   . PAF (paroxysmal atrial fibrillation) ([place]) dx 02-24-2016   followed by cardiologist-- dr Mamie Nick. [name]  . Pernicious anemia   . PONV (postoperative nausea and vomiting)   . Rib fracture 07/2018   left  . Right ureteral stone   . Wears glasses     Past Surgical History:  Procedure Laterality Date  . ABDOMINAL HYSTERECTOMY    . BREAST EXCISIONAL BIOPSY Left 11/02/2009   fibroadenoma (lumpectomy)  . BROW LIFT Right 04/07/2014   Procedure: REPAIR OF ENTROPION AND TRICHIASIS OF RIGHT LOWER EYE LID ;  Surgeon: Theodoro Kos, DO;  Location: [facility];  Service: Plastics;  Laterality: Right;  . CARDIOVERSION N/A 07/18/2017   Procedure: CARDIOVERSION;  Surgeon: Skeet Latch, MD;  Location: [facility];  Service: Cardiovascular;  Laterality: N/A;  . CARDIOVERSION N/A 07/13/2019   Procedure: CARDIOVERSION;  Surgeon: Josue Hector, MD;  Location: [facility] ENDOSCOPY;  Service: Cardiovascular;  Laterality: N/A;  . CARDIOVERSION N/A 09/03/2019   Procedure: CARDIOVERSION;  Surgeon: Buford Dresser, MD;  Location[facility];  Service: Cardiovascular;  Laterality: N/A;  . CATARACT EXTRACTION W/ INTRAOCULAR LENS  IMPLANT, BILATERAL    . CHOLECYSTECTOMY N/A 12/06/2017   Procedure: LAPAROSCOPIC CHOLECYSTECTOMY;  Surgeon: Jovita Kussmaul, MD;  Location: [facility];  Service: General;  Laterality: N/A;  . CORONARY ANGIOPLASTY  04/ 1994  dr Lia Foyer   PCI to LAD  . CORONARY ANGIOPLASTY    . CYSTOSCOPY W/ URETERAL STENT PLACEMENT  Right 01/20/2017   Procedure: CYSTOSCOPY WITH RIGHT RETROGRADE PYELOGRAM/RIGHT URETERAL STENT PLACEMENT;  Surgeon: Alexis Frock, MD;  Location: [facility];  Service: Urology;  Laterality: Right;  . CYSTOSCOPY WITH RETROGRADE PYELOGRAM, URETEROSCOPY AND STENT PLACEMENT Right 02/15/2017   Procedure: CYSTOSCOPY WITH RETROGRADE PYELOGRAM, URETEROSCOPY, STONE BASKETRY AND STENT REPLACEMENT;  Surgeon: Alexis Frock, MD;  Location: [facility];  Service: Urology;  Laterality: Right;  . CYSTOSCOPY/RETROGRADE/URETEROSCOPY/STONE EXTRACTION WITH BASKET  yrs ago  . DILATION AND CURETTAGE OF UTERUS    . HOLMIUM LASER APPLICATION Right 3/47/4259   Procedure: HOLMIUM LASER APPLICATION;  Surgeon: Alexis Frock, MD;  [facility];  Service: Urology;  Laterality: Right;  . INTRAMEDULLARY (IM) NAIL INTERTROCHANTERIC Right 01/13/2020   Procedure: INTRAMEDULLARY (IM) NAIL INTERTROCHANTRIC;  Surgeon: Rod Can, MD;  Location: [facility];  Service: Orthopedics;  Laterality: Right;  . KNEE SURGERY     right  . NEUROPLASTY / TRANSPOSITION ULNAR NERVE AT ELBOW Left 02-23-2000    [facility]   and Left Carpal Tunnel Release  . r eyelid cancer    . TONSILLECTOMY    .  TRANSTHORACIC ECHOCARDIOGRAM  02/26/2016   moderate focal basal and mild concentric LVH,  ef 55-60%,  akinesis of the basal-midanteroseptal, inferoseptal and apicalinferior myocardium, study not sufficient to evaluation diastolic funciton due to atrial fib./  trivial PR/  mild TR  . UMBILICAL HERNIA REPAIR       reports that she has quit smoking. Her smoking use included cigarettes. She quit after 5.00 years of use. She has never used smokeless tobacco. She reports that she does not drink alcohol and does not use drugs.  Allergies  Allergen Reactions  . Antihistamines, Chlorpheniramine-Type Other (See Comments)    Pt states that she passed out.   [name] Penicillins Anaphylaxis and Other (See Comments)    Has patient had a  PCN reaction causing immediate rash, facial/tongue/throat swelling, SOB or lightheadedness with hypotension: Yes Has patient had a PCN reaction causing severe rash involving mucus membranes or skin necrosis: No Has patient had a PCN reaction that required hospitalization No Has patient had a PCN reaction occurring within the last 10 years: No If all of the above answers are "NO", then may proceed with Cephalosporin use.  . Contrast Media [Iodinated Diagnostic Agents] Hives  . Sulfonamide Derivatives Other (See Comments)    Reaction:  Unknown   . Atorvastatin Other (See Comments)    myalgias  . Pheniramine Other (See Comments)    Pt states that she passed out.   . Sulfa Antibiotics Rash    Family History  Problem Relation Age of Onset  . Coronary artery disease Other        positive family hx of  . Cancer Other        family hx of  . Brain cancer Son   . Liver cancer Daughter   . Neuropathy Neg Hx     Prior to Admission medications   Medication Sig Start Date End Date Taking? Authorizing Provider  apixaban (ELIQUIS) 5 MG TABS tablet Take 5 mg by mouth 2 (two) times daily.   Yes [provider]  furosemide (LASIX) 20 MG tablet Take 20 mg by mouth daily. 02/13/20  Yes [provider]  metoprolol tartrate (LOPRESSOR) 50 MG tablet Take 50 mg by mouth daily.   Yes [provider]  Multiple Vitamins-Minerals (ICAPS) CAPS Take 1 capsule by mouth 2 (two) times daily.    Yes [provider]  Polyethyl Glycol-Propyl Glycol (SYSTANE) 0.4-0.3 % SOLN Place 1 drop into both eyes 2 (two) times daily as needed (dry eyes).   Yes [provider]  amiodarone (PACERONE) 200 MG tablet Take 200 mg by mouth 2 (two) times daily. 11/02/19   [provider]    Physical Exam:  Constitutional: Elderly female who appears to be fatigue Vitals:   06/29/2020 0500 06/08/2020 0530 06/14/2020 0545 06/30/2020 0615  BP: (!) 122/96 (!) 119/99 123/90 (!) 124/97  Pulse:  (!) 59 (!) 55 (!) 109 (!) 40  Resp: 19 16 14 18   Temp:      TempSrc:      SpO2: 90% 98% 98% 98%  Weight:      Height:       Eyes: PERRL, right eyelid lag ENMT: Mucous membranes are moist. Posterior pharynx clear of any exudate or lesions.  Neck: normal, supple, no masses, no thyromegaly.  Positive JVD Respiratory: clear to auscultation bilaterally, no wheezing, no crackles. Normal respiratory effort. No accessory muscle use.  Cardiovascular: Irregular irregular.  At least +1 pitting bilateral extremity edema. 2+ pedal pulses. No carotid bruits.  Abdomen: no tenderness, no masses palpated. No hepatosplenomegaly. Bowel sounds positive.  Musculoskeletal: no clubbing / cyanosis. No joint deformity upper and lower extremities. Good ROM, no contractures. Normal muscle tone.  Skin: Venous stasis changes of the bilateral lower extremity Neurologic: CN 2-12 grossly intact. Sensation intact, DTR normal. Strength 5/5 in all 4.  Psychiatric: Normal judgment and insight. Alert and oriented x 3. Normal mood.     Labs on Admission: I have personally reviewed following labs and imaging studies  CBC: Recent Labs  Lab 06/12/2020 0329 06/06/2020 0554  WBC 8.8  --   HGB 11.5* 13.3  HCT 38.0 39.0  MCV 86.0  --   PLT 275  --    Basic Metabolic Panel: Recent Labs  Lab 06/14/2020 0329 06/04/2020 0554  NA 143 143  K 5.1 5.1  CL 106 106  CO2 25  --   GLUCOSE 124* 105*  BUN 27* 40*  CREATININE 2.52* 2.40*  CALCIUM 9.7  --    GFR: Estimated Creatinine Clearance: 12.6 mL/min (A) (by C-G formula based on SCr of 2.4 mg/dL (H)). Liver Function Tests: Recent Labs  Lab 06/08/2020 0329  AST 83*  ALT 31  ALKPHOS 75  BILITOT 1.4*  PROT 6.6  ALBUMIN 3.3*   No results for input(s): LIPASE, AMYLASE in the last 168 hours. No results for input(s): AMMONIA in the last 168 hours. Coagulation Profile: Recent Labs  Lab 06/15/2020 0329  INR 1.8*   Cardiac Enzymes: No results for input(s): CKTOTAL, CKMB,  CKMBINDEX, TROPONINI in the last 168 hours. BNP (last 3 results) No results for input(s): PROBNP in the last 8760 hours. HbA1C: Recent Labs    06/14/2020 0329  HGBA1C 6.0*   CBG: No results for input(s): GLUCAP in the last 168 hours. Lipid Profile: Recent Labs    06/04/2020 0329  CHOL 144  HDL 72  LDLCALC 58  TRIG 68  CHOLHDL 2.0   Thyroid Function Tests: No results for input(s): TSH, T4TOTAL, FREET4, T3FREE, THYROIDAB in the last 72 hours. Anemia Panel: No results for input(s): VITAMINB12, FOLATE, FERRITIN, TIBC, IRON, RETICCTPCT in the last 72 hours. Urine analysis:    Component Value Date/Time   COLORURINE YELLOW 12/14/2017 [place] 12/14/2017 0757   LABSPEC 1.020 04/25/2018 0925   PHURINE 6.5 04/25/2018 0925   GLUCOSEU NEGATIVE 04/25/2018 0925   HGBUR NEGATIVE 04/25/2018 0925   BILIRUBINUR NEGATIVE 04/25/2018 0925   KETONESUR NEGATIVE 04/25/2018 0925   PROTEINUR 30 (A) 04/25/2018 0925   UROBILINOGEN 0.2 04/25/2018 0925   NITRITE NEGATIVE 04/25/2018 0925   LEUKOCYTESUR TRACE (A) 04/25/2018 0925   Sepsis Labs: Recent Results (from the past 240 hour(s))  Resp Panel by RT-PCR (Flu A&B, Covid) Nasopharyngeal Swab     Status: None   Collection Time: 07/03/2020  3:29 AM   Specimen: Nasopharyngeal Swab; Nasopharyngeal(NP) swabs in vial transport medium  Result Value Ref Range Status   SARS Coronavirus 2 by RT PCR NEGATIVE NEGATIVE Final    Comment: (NOTE) SARS-CoV-2 target nucleic acids are NOT DETECTED.  The SARS-CoV-2 RNA is generally detectable in upper respiratory specimens during the acute phase of infection. The lowest concentration of SARS-CoV-2 viral copies this assay can detect is 138 copies/mL. A negative result does not preclude SARS-Cov-2 infection and should not be used as the sole basis for treatment or other patient management decisions. A negative result may occur with  improper specimen collection/handling, submission of specimen  other than nasopharyngeal swab, presence of viral mutation(s) within  the areas targeted by this assay, and inadequate number of viral copies(<138 copies/mL). A negative result must be combined with clinical observations, patient history, and epidemiological information. The expected result is Negative.  Fact Sheet for Patients:  EntrepreneurPulse.com.au  Fact Sheet for Healthcare Providers:  IncredibleEmployment.be  This test is no t yet approved or cleared by the [place] FDA and  has been authorized for detection and/or diagnosis of SARS-CoV-2 by FDA under an Emergency Use Authorization (EUA). This EUA will remain  in effect (meaning this test can be used) for the duration of the COVID-19 declaration under Section 564(b)(1) of the Act, 21 U.S.C.section 360bbb-3(b)(1), unless the authorization is terminated  or revoked sooner.       Influenza A by PCR NEGATIVE NEGATIVE Final   Influenza B by PCR NEGATIVE NEGATIVE Final    Comment: (NOTE) The Xpert Xpress SARS-CoV-2/FLU/RSV plus assay is intended as an aid in the diagnosis of influenza from Nasopharyngeal swab specimens and should not be used as a sole basis for treatment. Nasal washings and aspirates are unacceptable for Xpert Xpress SARS-CoV-2/FLU/RSV testing.  Fact Sheet for Patients: EntrepreneurPulse.com.au  Fact Sheet for Healthcare Providers: IncredibleEmployment.be  This test is not yet approved or cleared by the [place] FDA and has been authorized for detection and/or diagnosis of SARS-CoV-2 by FDA under an Emergency Use Authorization (EUA). This EUA will remain in effect (meaning this test can be used) for the duration of the COVID-19 declaration under Section 564(b)(1) of the Act, 21 U.S.C. section 360bbb-3(b)(1), unless the authorization is terminated or revoked.  Performed at [facility], [place] [address] [place],  [place] [address]      Radiological Exams on Admission: DG Chest Portable 1 View  Result Date: July 07, 2020 CLINICAL DATA:  Chest pain, dyspnea EXAM: PORTABLE CHEST 1 VIEW COMPARISON:  01/14/2020 FINDINGS: Since the prior examination, moderate right and probable tiny left pleural effusions have developed. There is interval development of moderate interstitial pulmonary infiltrate, asymmetrically more severe throughout the right lung. Parenchymal cyst is again noted within the right mid lung zone, more conspicuous due to overlying asymmetric pulmonary infiltrate. No pneumothorax. Mild cardiomegaly appears unchanged though the cardiac silhouette is partially obscured by pleural fluid. No acute bone abnormality. IMPRESSION: Diffuse interstitial pulmonary infiltrate and bilateral pleural effusions, right greater than left, most suggestive of mild to moderate cardiogenic failure with asymmetric edema involving the right lung. In Electronically Signed   By: Fidela Salisbury MD   On: 07-Jul-2020 03:41    EKG: Independently reviewed.  Atrial fibrillation 104 bpm with the elevation in V3-5  Assessment/Plan Acute MI: Patient presented with complaints of chest pressure with radiation to the back.  EKG concerning for new ST elevations noted in V3-V5.  High-sensitivity troponin  12,241-> 12,017.  LDL 58 which appears to be at goal.  Patient had been given full dose aspirin and nitroglycerin in route with subsequent drop in blood pressure.  Given patient's advanced age, acute kidney injury, and other comorbidities cardiology recommended medical management -Admit to a progressive bed -Bedrest for -Heparin drip per pharmacy -Check echocardiogram -Appreciate cardiology consultative services we will follow-up for any further recommendation  Systolic congestive heart failure: Acute on chronic.  Chest x-ray showing diffuse interstitial infiltrates and bilateral pleural effusions suggestive of edema.  BNP 2431.5.  She had been  taking Lasix 20 mg daily at home as prescribed.  Previously had been documented around 55-60% in 2017, but seen to be acutely decreased down to 25-30%  in 07/2019.Ischemic work-up had been recommended, but had not been pursued. Patient had initially been given 40 mg Lasix IV. -Continuous pulse oximetry with nasal cannula oxygen to maintain O2 saturations -Strict intake and output -Daily weights -Lasix 40 mg twice daily -Adjust diuresis as recommended per cardiology  Acute kidney injury superimposed on chronic kidney disease: Patient presents with creatinine elevated up to 2.52 with BUN 27.  Baseline creatinine previously noted to be around 1.4.  Suspect hypoperfusion secondary to heart failure as likely cause of injury. -Continue to monitor kidney function with IV diuresis  Persistent A. fib on anticoagulation: Chronic.  Patient noted to be in atrial fibrillation with heart rates 110s.  Cardioversion had been attempted last year without success. CHA2DS2-VASc score = at least 5(CHF, HTN, age, gender).  Home medications include amiodarone 200 mg twice daily and Eliquis 5 mg twice daily.  Previously patient had been on metoprolol, but was stopped during her hospitalization in August 2021 due to low blood pressures and worsening kidney function. -Correct electrolytes maintain goal potassium 4 and magnesium 2 -Hold Eliquis -Continue amiodarone  Coronary artery disease: Patient with prior PCI back in 1994.  Essential hypertension: Blood pressures currently stable. -Continue to monitor  Normocytic anemia: Hemoglobin 11.5 g/dL which appears improved from previous. -Continue to monitor  Prediabetes: Hemoglobin A1c 6 and on admission glucose is mildly elevated 124, but suspect secondary to acute stress.  DNR: Present on admission  DVT prophylaxis: Heparin gtt Code Status: DNR Family Communication: Son updated over the phone/  Disposition Plan: To be determined Consults called:  Cardiology Admission status: Inpatient status  Norval Morton MD [facility]   If 7PM-7AM, please contact night-coverage   06/12/2020, 7:07 AM

## 2020-06-10 NOTE — Progress Notes (Addendum)
Progress Note  Patient Name: Shelly Obrien Date of Encounter: 07/02/2020  [facility] Cardiologist: Mertie Moores, MD   Subjective   Mild left arm pressure this morning. Remains on IV heparin. Breathing is about the same, remains on 3L [place].   Inpatient Medications    Scheduled Meds: . amiodarone  200 mg Oral BID  . furosemide  40 mg Intravenous BID  . sodium chloride flush  3 mL Intravenous Q12H   Continuous Infusions: . sodium chloride    . heparin 800 Units/hr (06/05/2020 0750)   PRN Meds: sodium chloride, acetaminophen, ondansetron (ZOFRAN) IV, polyvinyl alcohol, sodium chloride flush   Vital Signs    Vitals:   06/25/2020 0530 06/25/2020 0545 06/16/2020 0615 06/09/2020 0750  BP: (!) 119/99 123/90 (!) 124/97 (!) 127/91  Pulse: (!) 55 (!) 109 (!) 40 (!) 107  Resp: 16 14 18 13   Temp:    97.6 F (36.4 C)  TempSrc:    Oral  SpO2: 98% 98% 98% 98%  Weight:      Height:       No intake or output data in the 24 hours ending 06/05/2020 0825 Last 3 Weights 06/16/2020 04/22/2020 01/12/2020  Weight (lbs) 135 lb 160 lb 167 lb 8.8 oz  Weight (kg) 61.236 kg 72.576 kg 76 kg      Telemetry    Afib rate 120s - Personally Reviewed  ECG    Afib RVR 104bpm with ST elevation in v3-5, underlying IVCD - Personally Reviewed  Physical Exam  Pleasant older, somewhat frail appearing white female GEN: No acute distress.   Neck: + JVD Cardiac: Irreg Irreg, no murmurs, rubs, or gallops.  Respiratory: Clear to auscultation bilaterally. GI: Soft, nontender, non-distended  MS: 1-2+ bilateral LE edema; No deformity. Neuro:  Nonfocal  Psych: Normal affect   Labs    High Sensitivity Troponin:   Recent Labs  Lab 06/30/2020 0329 06/21/2020 0535  TROPONINIHS 12,241* 12,017*      Chemistry Recent Labs  Lab 06/22/2020 0329 06/26/2020 0554  NA 143 143  K 5.1 5.1  CL 106 106  CO2 25  --   GLUCOSE 124* 105*  BUN 27* 40*  CREATININE 2.52* 2.40*  CALCIUM 9.7  --   PROT 6.6  --   ALBUMIN 3.3*   --   AST 83*  --   ALT 31  --   ALKPHOS 75  --   BILITOT 1.4*  --   GFRNONAA 17*  --   ANIONGAP 12  --      Hematology Recent Labs  Lab 07/04/2020 0329 06/18/2020 0554  WBC 8.8  --   RBC 4.42  --   HGB 11.5* 13.3  HCT 38.0 39.0  MCV 86.0  --   MCH 26.0  --   MCHC 30.3  --   RDW 20.7*  --   PLT 275  --     BNP Recent Labs  Lab 06/07/2020 0329  BNP 2,431.5*     DDimer No results for input(s): DDIMER in the last 168 hours.   Radiology    DG Chest Portable 1 View  Result Date: 06/19/2020 CLINICAL DATA:  Chest pain, dyspnea EXAM: PORTABLE CHEST 1 VIEW COMPARISON:  01/14/2020 FINDINGS: Since the prior examination, moderate right and probable tiny left pleural effusions have developed. There is interval development of moderate interstitial pulmonary infiltrate, asymmetrically more severe throughout the right lung. Parenchymal cyst is again noted within the right mid lung zone, more conspicuous due to overlying  asymmetric pulmonary infiltrate. No pneumothorax. Mild cardiomegaly appears unchanged though the cardiac silhouette is partially obscured by pleural fluid. No acute bone abnormality. IMPRESSION: Diffuse interstitial pulmonary infiltrate and bilateral pleural effusions, right greater than left, most suggestive of mild to moderate cardiogenic failure with asymmetric edema involving the right lung. In Electronically Signed   By: Fidela Salisbury MD   On: 06/20/2020 03:41    Cardiac Studies   Echo: 07/2019  IMPRESSIONS    1. Left ventricular ejection fraction, by estimation, is 25 to 30%. The  left ventricle has severely decreased function. The left ventricle  demonstrates regional wall motion abnormalities (see scoring  diagram/findings for description). Diffuse  hypokinesis with inferoseptal akinesis. Left ventricular diastolic  parameters are indeterminate.  2. Right ventricular systolic function is normal. There is moderately  elevated pulmonary artery systolic pressure.  The estimated right  ventricular systolic pressure is 123456 mmHg.  3. Mild mitral valve regurgitation.  4. The aortic valve is tricuspid. Aortic sclerosis without significant  stenosis. No regurgitation.  5. Dilated IVC with estimate RA pressure 15 mmHg.  6. The patient was in atrial fibrillation.   Patient Profile     87 y.o. female  with a hx of CAD and remote h/o MI/PCI, chronic/persistent AF on eliquis for CVA prophylaxis, ICM, CKD, MGUS, recent hip fracture in August of 2021 s/p intramedullary fixation on 01-13-20, HTN who is being seen today for the evaluation of CP/DOE and abnormal EKG showing possible STEMI at the request of Dr. Dayna Barker [facility]).  Assessment & Plan    1. NSTEMI with abnormal EKG: suspect she could have had plaque rupture for true STEMI but given advanced age and co-morbidities, plan is to treat conservatively. Has some mild left arm discomfort this morning. hsTn 12241>>12017.  -- continue IV heparin likely total of 48hrs, add ASA given Eliquis is held. Has been intolerant to statins in the past (multiple allergies reported). Consider adding low dose BB if blood pressures can tolerate -- echo is pending  2. Chronic Afib: has been on Eliquis and amiodarone prior to admission. Eliquis held, now on IV heparin with NSTEMI.  -- continue amiodarone, with consideration of BB therapy if blood pressures remain stable  3. Acute on chronic systolic HF: known EF of 0000000 on echo in 07/2019 which was newly decreased from normal in 2017. Ischemic work up was not felt to be an option at that time given her advanced age. She is volume overloaded. BNP elevated at 2431 on admission. Medical therapy has been limited with renal dysfunction and has not tolerated BB therapy in the past 2/2 hypotension. If blood pressures are stable, consider adding low dose BB this admission in the setting of NSTEMI. Not a candidate for advanced therapies given age.   4. AKI on CKD: baseline around 1.4. Cr  noted at 2.5>>2.4 on admission. Likely 2/2 hypoperfusion in the setting of acute illness. Monitor with BMET with the need for diuresis.      For questions or updates, please contact [place] Please consult [email or web address] for contact info under        Signed, Reino Bellis, NP  06/04/2020, 8:25 AM     Agree with note by Reino Bellis NP-C  87 year old female who lives independently and is cared for by her son we were asked to see last night because of a "STEMI".  She was seen by Dr. Martinique.  She is a patient of Dr. Elmarie Shiley.  She did have an IVCD with lateral ST  segment elevation.  She has known severe LV dysfunction, moderate renal insufficiency as well as A. fib on Eliquis and amiodarone.  She is a DNR.  She is currently pain-free.  I agree with the conservative approach.  I discussed this with the patient and her son and everybody is in agreement.  Her exam is benign today.  She is in A. fib with a slightly elevated ventricular response.  Blood pressure is elevated as well.  She has 1-2+ pitting edema.  I think starting her on low-dose beta-blockade for rate control would be an appropriate intervention.  If she decompensates, palliative care should be consulted.  Lorretta Harp, M.D., [place], [facility], [place]ver[place] [address] [place], [place]  [address]  [phone] 2020-07-08 10:03 AM

## 2020-06-10 NOTE — ED Triage Notes (Signed)
Patient from home, was recently left an assisted living facility and went home.  Patient started with chest pain about one hour pta of [facility].  Patient also having some increase in shortness of breath for the last 4 days.  Patient was given one SL nitro, 324mg  ASA.  Patient does have a cough.  BP dropped 40 pts with the nitro from 150's/90's to 110's/60s.

## 2020-06-10 NOTE — Consult Note (Signed)
Cardiology Consultation:   Patient ID: Shelly Obrien MRN: [id number]; DOB: 01-Dec-1922  Admit date: 06/21/2020 Date of Consult: 06/06/2020  Primary Care Provider: [name], MD [facility] Cardiologist: Mertie Moores, MD  [place] Electrophysiologist:  None    Patient Profile:   Shelly Obrien is a 87 y.o. female with a hx of CAD and remote h/o MI/PCI, chronic/persistent AF on eliquis for CVA prophylaxis,  CKD, MGUS, recent hip fracture in August of 2021 s/p intramedullary fixation on 01-13-20, HTN who is being seen today for the evaluation of CP/DOE and abnormal EKG showing possible STEMI at the request of Dr. Dayna Barker [facility]).  History of Present Illness:   Shelly Obrien is a 87yo lady who is very functional for her age, currently living at home alone (having recently returned home after being in rehab s/p hip fracture and repair) who called EMS today due to 3-4d h/o worsening SOB/DOE and onset of CP around 1am today. Pt states she has been using a wheelchair at home since her hip fracture, and has noticed that getting herself around the house in the wheelchair has been causing significant SOB and fatigue over the past several days. This evening she was awoken from sleep by chest discomfort going across the center and both sides of her chest; she states the CP improves when she breathes deeply. In reviewing prior notes, it appears pt underwent DCCV for afib in April 2021 and was put on amiodarone to try to maintain NSR, but it looks like she was back in AF by 10-12-19 when she was seen in the AF clinic, and it was decided to pursue a rate as opposed to rhythm-control approach. She has been chronic/persistent AF since that time. Amiodarone has been used for rate control as opposed to BB as she had iatrogenic hypotension. She has chronic HF; this appears to have been diagnosed in Feb 2021 w/ new EF down to 25-30% (with regional wall motion abnormalities) from 55-60% prior in 2017. Ischemic workup  was recommended on outpatient basis due to the change/decrease in LVEF per discharge summary, but was never done as pt was having no acute anginal sx.  Tonight, pt's EKG shows possible STEMI w/ new ST elevation noted in the inferior leads as well as V3-V5 (as compared to prior tracings). STEMI called in the field but canceled as per interventional cardiology due to pt's advanced age and comorbidities. Pt has been in AF w/ HR in the low 100s in the ED, with evidence of pulm edema on CXR.    Past Medical History:  Diagnosis Date  . Aortic ectasia ([place])   . Arm fracture 2013   right, Dr. Sharen Heck  . B12 deficiency   . CAD S/P percutaneous coronary angioplasty    hx MI 04/ 1994  s/p  PCI to LAD  . CKD (chronic kidney disease), stage III ([place])   . Complication of anesthesia    hard to wake   . Gallstones   . History of basal cell carcinoma (BCC) excision    right eye area 08/ 2013  . History of kidney stones 01/20/2017  . History of MI (myocardial infarction) 09/1992   s/p  PCI to LAD  . History of squamous cell carcinoma excision    right lower leg 2015 ;  2016  . Hx of colonic polyps   . Hyperlipidemia   . Hypertension   . Macular degeneration   . Osteoporosis   . PAF (paroxysmal atrial fibrillation) ([place]) dx 02-24-2016  followed by cardiologist-- dr Mamie Nick. [name]  . Pernicious anemia   . PONV (postoperative nausea and vomiting)   . Rib fracture 07/2018   left  . Right ureteral stone   . Wears glasses     Past Surgical History:  Procedure Laterality Date  . ABDOMINAL HYSTERECTOMY    . BREAST EXCISIONAL BIOPSY Left 11/02/2009   fibroadenoma (lumpectomy)  . BROW LIFT Right 04/07/2014   Procedure: REPAIR OF ENTROPION AND TRICHIASIS OF RIGHT LOWER EYE LID ;  Surgeon: Theodoro Kos, DO;  Location: [place];  Service: Plastics;  Laterality: Right;  . CARDIOVERSION N/A 07/18/2017   Procedure: CARDIOVERSION;  Surgeon: Skeet Latch, MD;  Location: [place];   Service: Cardiovascular;  Laterality: N/A;  . CARDIOVERSION N/A 07/13/2019   Procedure: CARDIOVERSION;  Surgeon: Josue Hector, MD;  Location: [facility] ENDOSCOPY;  Service: Cardiovascular;  Laterality: N/A;  . CARDIOVERSION N/A 09/03/2019   Procedure: CARDIOVERSION;  Surgeon: Buford Dresser, MD;  Location: [facility] ENDOSCOPY;  Service: Cardiovascular;  Laterality: N/A;  . CATARACT EXTRACTION W/ INTRAOCULAR LENS  IMPLANT, BILATERAL    . CHOLECYSTECTOMY N/A 12/06/2017   Procedure: LAPAROSCOPIC CHOLECYSTECTOMY;  Surgeon: Jovita Kussmaul, MD;  Location: [facility];  Service: General;  Laterality: N/A;  . CORONARY ANGIOPLASTY  04/ 1994  dr Lia Foyer   PCI to LAD  . CORONARY ANGIOPLASTY    . CYSTOSCOPY W/ URETERAL STENT PLACEMENT Right 01/20/2017   Procedure: CYSTOSCOPY WITH RIGHT RETROGRADE PYELOGRAM/RIGHT URETERAL STENT PLACEMENT;  Surgeon: Alexis Frock, MD;  Location: [facility];  Service: Urology;  Laterality: Right;  . CYSTOSCOPY WITH RETROGRADE PYELOGRAM, URETEROSCOPY AND STENT PLACEMENT Right 02/15/2017   Procedure: CYSTOSCOPY WITH RETROGRADE PYELOGRAM, URETEROSCOPY, STONE BASKETRY AND STENT REPLACEMENT;  Surgeon: Alexis Frock, MD;  Location[facility];  Service: Urology;  Laterality: Right;  . CYSTOSCOPY/RETROGRADE/URETEROSCOPY/STONE EXTRACTION WITH BASKET  yrs ago  . DILATION AND CURETTAGE OF UTERUS    . HOLMIUM LASER APPLICATION Right 09/28/621   Procedure: HOLMIUM LASER APPLICATION;  Surgeon: Alexis Frock, MD;  Location: [facility];  Service: Urology;  Laterality: Right;  . INTRAMEDULLARY (IM) NAIL INTERTROCHANTERIC Right 01/13/2020   Procedure: INTRAMEDULLARY (IM) NAIL INTERTROCHANTRIC;  Surgeon: Rod Can, MD;  Location: [facility];  Service: Orthopedics;  Laterality: Right;  . KNEE SURGERY     right  . NEUROPLASTY / TRANSPOSITION ULNAR NERVE AT ELBOW Left 02-23-2000    [facility]   and Left Carpal Tunnel Release  . r eyelid cancer    . TONSILLECTOMY    .  TRANSTHORACIC ECHOCARDIOGRAM  02/26/2016   moderate focal basal and mild concentric LVH,  ef 55-60%,  akinesis of the basal-midanteroseptal, inferoseptal and apicalinferior myocardium, study not sufficient to evaluation diastolic funciton due to atrial fib./  trivial PR/  mild TR  . UMBILICAL HERNIA REPAIR       Home Medications:  Prior to Admission medications   Medication Sig Start Date End Date Taking? Authorizing Provider  amiodarone (PACERONE) 200 MG tablet Take 200 mg by mouth 2 (two) times daily. 11/02/19   [provider]  DHA-EPA-Flaxseed Oil-Vitamin E (THERA TEARS NUTRITION PO) Take 1 drop by mouth daily as needed (watery eyes).    [provider]  fluocinonide (LIDEX) 0.05 % external solution Apply 1 application topically as needed (itching).  06/15/19   [provider]  furosemide (LASIX) 40 MG tablet Take 40 mg by mouth daily. 02/13/20   [provider]  Multiple Vitamins-Minerals (ICAPS) CAPS Take 1 capsule by  mouth 2 (two) times daily.     [provider]  Phenylephrine HCl (NOSE DROPS NA) Place 1 drop into the nose daily as needed (alleriges).    [provider]  Rivaroxaban (XARELTO) 15 MG TABS tablet Take 1 tablet (15 mg total) by mouth daily with supper. 05/05/20   Richardson Dopp T, PA-C  triamcinolone (KENALOG) 0.025 % cream Apply 1 application topically as needed (itching).  06/15/19   [provider]    Inpatient Medications: Scheduled Meds:  Continuous Infusions:  PRN Meds:   Allergies:    Allergies  Allergen Reactions  . Antihistamines, Chlorpheniramine-Type Other (See Comments)    Pt states that she passed out.   [name] Penicillins Anaphylaxis and Other (See Comments)    Has patient had a PCN reaction causing immediate rash, facial/tongue/throat swelling, SOB or lightheadedness with hypotension: Yes Has patient had a PCN reaction causing severe rash involving mucus membranes or skin necrosis: No Has patient  had a PCN reaction that required hospitalization No Has patient had a PCN reaction occurring within the last 10 years: No If all of the above answers are "NO", then may proceed with Cephalosporin use.  . Contrast Media [Iodinated Diagnostic Agents] Hives  . Sulfonamide Derivatives Other (See Comments)    Reaction:  Unknown   . Atorvastatin Other (See Comments)    myalgias  . Pheniramine Other (See Comments)    Pt states that she passed out.     Social History:   Social History   Socioeconomic History  . Marital status: Widowed    Spouse name: Not on file  . Number of children: 2  . Years of education: Not on file  . Highest education level: Not on file  Occupational History  . Occupation: Retired  Tobacco Use  . Smoking status: Former Smoker    Years: 5.00    Types: Cigarettes  . Smokeless tobacco: Never Used  . Tobacco comment: "didn't have the habit that much", just smoked "with the girls"  Vaping Use  . Vaping Use: Never used  Substance and Sexual Activity  . Alcohol use: No    Alcohol/week: 0.0 standard drinks  . Drug use: No  . Sexual activity: Not on file  Other Topics Concern  . Not on file  Social History Narrative   Lives at home alone   Right handed   Social Determinants of Health   Financial Resource Strain: Not on file  Food Insecurity: Not on file  Transportation Needs: Not on file  Physical Activity: Not on file  Stress: Not on file  Social Connections: Not on file  Intimate Partner Violence: Not on file    Family History:    Family History  Problem Relation Age of Onset  . Coronary artery disease Other        positive family hx of  . Cancer Other        family hx of  . Brain cancer Son   . Liver cancer Daughter   . Neuropathy Neg Hx      ROS:  Please see the history of present illness.   All other ROS reviewed and negative.     Physical Exam/Data:   Vitals:   07/10/20 0340 07/10/20 0340 July 10, 2020 0345 07-10-20 0350  BP: (!)  126/93  (!) 132/92 (!) 126/106  Pulse:   (!) 42 94  Resp: 16  20 14   Temp:  (!) 97.3 F (36.3 C)    TempSrc:  Oral  SpO2:   92% 95%  Weight:      Height:       No intake or output data in the 24 hours ending 07/03/2020 0411 Last 3 Weights 06/12/2020 04/22/2020 01/12/2020  Weight (lbs) 135 lb 160 lb 167 lb 8.8 oz  Weight (kg) 61.236 kg 72.576 kg 76 kg     Body mass index is 22.47 kg/m.  General:  Well nourished, well developed, in no acute distress HEENT: normal Lymph: no adenopathy Neck: + JVD Endocrine:  No thryomegaly Vascular: No carotid bruits; DP pulses not palpable  Cardiac:  normal S1, S2; irreg irreg; no murmur  Lungs:  Decreased BS bilaterally; poor air movement Abd: soft, nontender, no hepatomegaly  Ext: 1-2+ bilateral edema, ext warm Musculoskeletal:  No obvious deformities Skin: warm and dry  Neuro:  no focal abnormalities noted Psych:  Normal affect   EKG:  The EKG was personally reviewed and demonstrates:  afib w/ RVR, HR 104, with ST elevation noted in the inferior leads and V3-V5 (new compared to prior tracings) Telemetry:  Telemetry was personally reviewed and demonstrates:  afib w/ HR low 100s  Relevant CV Studies: 06-12-19 TTE 1. Left ventricular ejection fraction, by estimation, is 25 to 30%. The  left ventricle has severely decreased function. The left ventricle  demonstrates regional wall motion abnormalities (see scoring  diagram/findings for description). Diffuse  hypokinesis with inferoseptal akinesis. Left ventricular diastolic  parameters are indeterminate.  2. Right ventricular systolic function is normal. There is moderately  elevated pulmonary artery systolic pressure. The estimated right  ventricular systolic pressure is 123456 mmHg.  3. Mild mitral valve regurgitation.  4. The aortic valve is tricuspid. Aortic sclerosis without significant  stenosis. No regurgitation.  5. Dilated IVC with estimate RA pressure 15 mmHg.  6. The patient was  in atrial fibrillation.   02-26-16 TTE - Left ventricle: The cavity size was normal. There was moderate  focal basal and mild concentric hypertrophy. Systolic function  was normal. The estimated ejection fraction was in the range of  55% to 60%. There is akinesis of the basal-midanteroseptal and  inferoseptal myocardium. There is akinesis of the apicalinferior  myocardium.   Laboratory Data:  High Sensitivity Troponin:  No results for input(s): TROPONINIHS in the last 720 hours.   ChemistryNo results for input(s): NA, K, CL, CO2, GLUCOSE, BUN, CREATININE, CALCIUM, GFRNONAA, GFRAA, ANIONGAP in the last 168 hours.  No results for input(s): PROT, ALBUMIN, AST, ALT, ALKPHOS, BILITOT in the last 168 hours. Hematology Recent Labs  Lab 06/15/2020 0329  WBC 8.8  RBC 4.42  HGB 11.5*  HCT 38.0  MCV 86.0  MCH 26.0  MCHC 30.3  RDW 20.7*  PLT 275   BNPNo results for input(s): BNP, PROBNP in the last 168 hours.  DDimer No results for input(s): DDIMER in the last 168 hours.   Radiology/Studies:  DG Chest Portable 1 View  Result Date: 06/27/2020 CLINICAL DATA:  Chest pain, dyspnea EXAM: PORTABLE CHEST 1 VIEW COMPARISON:  01/14/2020 FINDINGS: Since the prior examination, moderate right and probable tiny left pleural effusions have developed. There is interval development of moderate interstitial pulmonary infiltrate, asymmetrically more severe throughout the right lung. Parenchymal cyst is again noted within the right mid lung zone, more conspicuous due to overlying asymmetric pulmonary infiltrate. No pneumothorax. Mild cardiomegaly appears unchanged though the cardiac silhouette is partially obscured by pleural fluid. No acute bone abnormality. IMPRESSION: Diffuse interstitial pulmonary infiltrate and bilateral pleural effusions, right greater than left, most  suggestive of mild to moderate cardiogenic failure with asymmetric edema involving the right lung. In Electronically Signed   By:  Fidela Salisbury MD   On: 06/19/2020 03:41     Assessment and Plan:   1. ? STEMI: pt may have acute plaque rupture and true STEMI, but emergent intervention deemed too high-risk due to pt's advanced age and comorbidities, and more conservative medical therapy recommended. Would start heparin IV gtt and hold eliquis. Repeat TTE. She is clinically decompensated from a HF standpoint; see below. Her AF is not well rate-controlled currently; see below.  2. AF: she has chronic/persistent AF being managed by a rate-control strategy. She was unable to tolerate BB in the past due to iatrogenic hypotension, and amiodarone has been used for rate control. Would try first restarting home amio 200mg  bid; if not adequate for rate control, consider EP consult to recommend alternative agent as she has been unable to tolerate BB (and would not likely tolerate CCB for same reasons). Would start heparin IV gtt in short-term given possible STEMI, hold eliquis for now. 3. ICM: last EF 25-30% in Feb 2021 (newly decreased from prior EF 55-60% in 2017). Pt is clinically decompensated. ischemic w/u recommended back in Feb 2021 to r/o progressive CAD, but deferred at the time due to lack of anginal sx. pt deemed not a good candidate to undergo LHC at this time. Would start IV lasix for diuresis. She is not really on any goal-directed HF therapy at home. meds could be adjusted while she is admitted to attempt to get her on some sort of afterload reduction (although renal function may limit choices) +/- BB if tolerated.  4.  HTN: BP will have to be monitored. meds can be adjusted in-house as above. 5. CKD: labs pending       TIMI Risk Score for ST  Elevation MI:   The patient's TIMI risk score is 10, which indicates a 35.9% risk of all cause mortality at 30 days.   New York Heart Association (NYHA) Functional Class NYHA Class IV   CHA2DS2-VASc Score = 5  This indicates a 7.2% annual risk of stroke. The patient's score is  based upon: CHF History: Yes HTN History: Yes Diabetes History: No Stroke History: No Vascular Disease History: No Age Score: 2 Gender Score: 1       For questions or updates, please contact [place] Please consult [email or web address] for contact info under    Signed, Rudean Curt, MD, [facility]  07/03/2020 4:11 AM

## 2020-06-10 NOTE — Consult Note (Signed)
Consultation Note Date: 07/01/2020   Patient Name: Shelly Obrien  DOB: 08-Jan-1923  MRN: [id number]  Age / Sex: 87 y.o., female  PCP: Shelly Orn, MD Referring Physician: Norval Morton, MD  Reason for Consultation: Establishing goals of care  HPI/Patient Profile: 87 y.o. female  with past medical history of Atrial fibrillation on Eliquis, chronic systolic CHF (last EF 79-39%), CKD stage III, hypertension, hyperlipidemia, and CAD s/p PCI who presented to the emergency department on 06/16/2020 with chest pain that had awakened her from sleep. Associated symptoms included shortness of breath worsening over the past week, non-productive cough, fatigue, and LE swelling.   ED Course: EKG showing signs of ST elevation in V4-5 so code STEMI was called and later cancelled by cardiology. Chest x-ray showed diffuse interstitial pulmonary infiltrates and bilateral pleural effusions concerning for heart failure. Creatinine 2.52. Patient admitted to [facility] for management of acute MI, acute on chronic systolic CHF, and AKI.   Clinical Assessment and Goals of Care: I have reviewed medical records including EPIC notes, labs and imaging, and met at bedside with patient  to discuss diagnosis, prognosis, GOC, EOL wishes, disposition, and options.  I introduced Palliative Medicine as specialized medical care for people living with serious illness. It focuses on providing relief from the symptoms and stress of a serious illness.   We discussed a brief life review of the patient. She has been a widow for approximately 20 years years. She had a biological son and daughter, both who sadly died of cancer in the 14's. She also has an adopted son Shelly Obrien), who was originally adopted by her parents but came to live with her when he was 87 years old after her parents died.  Patient lives alone in a place for "old people" as she says.  (independent living?). She reports "they take care of the outside, but I take care of the inside".  As far as functional status, she is ambulatory with a walker for shorter distances. She uses a wheelchair in her kitchen so she has full use of her arms and hands. She proudly shares that she prepared Christmas dinner for 5 people. Shelly Obrien does her grocery shopping, and she hires someone to clean.   We discussed her current illness and what it means in the larger context of her ongoing co-morbidities.  Natural disease trajectory of advanced age and chronic illness was discussed.  I attempted to elicit values and goals of care important to the patient. Her goal is to return home.    Advanced directives, concepts specific to code status, artifical feeding and hydration, and rehospitalization were considered and discussed. I did confirm code status as DNR/DNI. She has a durable DNR form on file in [place].   Hospice and Palliative Care services outpatient were explained and offered. Patient reports that she has "hospice nurses" that come out to the house several times per week.   I later spoke with son Shelly Obrien to clarify if hospice is involved. He states it was arranged by her  PCP Dr. Lavone Obrien - he thinks through [facility]. After further discussion, we think it may be home health services, not actually hospice.   Primary decision maker: Patient   SUMMARY OF RECOMMENDATIONS   - DNR/DNI as previously documented - continue current medical care - goal of care is to return home (she states her son will be staying with her for awhile) - recommend outpatient palliative or hospice at discharge (need to clarify if patient already has hospice versus home health) - PMT will continue to follow   Code Status/Advance Care Planning:  DNR  Symptom Management:   Per primary team  Palliative Prophylaxis:   Frequent Pain Assessment  Psycho-social/Spiritual:   Created space and opportunity for patient and family  to express thoughts and feelings regarding patient's current medical situation.   Emotional support provided    Prognosis:   Difficult to determine   Discharge Planning: To Be Determined      Primary Diagnoses: Present on Admission: . Acute MI ([place]) . Longstanding persistent atrial fibrillation ([place]) . DNR (do not resuscitate) . Acute kidney injury superimposed on chronic kidney disease ([place]) . CAD (coronary artery disease) . Prediabetes . Normocytic anemia . Acute on chronic systolic CHF (congestive heart failure) ([place])   I have reviewed the medical record, interviewed the patient and family, and examined the patient. The following aspects are pertinent.  Past Medical History:  Diagnosis Date  . Aortic ectasia ([place])   . Arm fracture 2013   right, [facility]  . B12 deficiency   . CAD S/P percutaneous coronary angioplasty    hx MI 04/ 1994  s/p  PCI to LAD  . CKD (chronic kidney disease), stage III ([place])   . Complication of anesthesia    hard to wake   . Gallstones   . History of basal cell carcinoma (BCC) excision    right eye area 08/ 2013  . History of kidney stones 01/20/2017  . History of MI (myocardial infarction) 09/1992   s/p  PCI to LAD  . History of squamous cell carcinoma excision    right lower leg 2015 ;  2016  . Hx of colonic polyps   . Hyperlipidemia   . Hypertension   . Macular degeneration   . Osteoporosis   . PAF (paroxysmal atrial fibrillation) ([place]) dx 02-24-2016   followed by cardiologist-- dr Shelly Obrien  . Pernicious anemia   . PONV (postoperative nausea and vomiting)   . Rib fracture 07/2018   left  . Right ureteral stone   . Wears glasses     Family History  Problem Relation Age of Onset  . Coronary artery disease Other        positive family hx of  . Cancer Other        family hx of  . Brain cancer Son   . Liver cancer Daughter   . Neuropathy Neg Hx    Scheduled Meds: . amiodarone  200 mg Oral BID  . aspirin EC  81 mg  Oral Daily  . furosemide  40 mg Intravenous BID  . sodium chloride flush  3 mL Intravenous Q12H   Continuous Infusions: . sodium chloride    . heparin 800 Units/hr (06/16/2020 1551)   PRN Meds:.sodium chloride, acetaminophen, ondansetron (ZOFRAN) IV, polyvinyl alcohol, sodium chloride flush  Medications Prior to Admission:  Prior to Admission medications   Medication Sig Start Date End Date Taking? Authorizing Provider  amiodarone (PACERONE) 200 MG tablet Take 200 mg by mouth  2 (two) times daily. 11/02/19  Yes [provider]  apixaban (ELIQUIS) 5 MG TABS tablet Take 5 mg by mouth 2 (two) times daily.   Yes [provider]  furosemide (LASIX) 20 MG tablet Take 20 mg by mouth daily. 02/13/20  Yes [provider]  metoprolol tartrate (LOPRESSOR) 50 MG tablet Take 50 mg by mouth daily.   Yes [provider]  Multiple Vitamins-Minerals (ICAPS) CAPS Take 1 capsule by mouth 2 (two) times daily.    Yes [provider]  Polyethyl Glycol-Propyl Glycol (SYSTANE) 0.4-0.3 % SOLN Place 1 drop into both eyes 2 (two) times daily as needed (dry eyes).   Yes [provider]   Allergies  Allergen Reactions  . Antihistamines, Chlorpheniramine-Type Other (See Comments)    Pt states that she passed out.   [name] Penicillins Anaphylaxis and Other (See Comments)    Has patient had a PCN reaction causing immediate rash, facial/tongue/throat swelling, SOB or lightheadedness with hypotension: Yes Has patient had a PCN reaction causing severe rash involving mucus membranes or skin necrosis: No Has patient had a PCN reaction that required hospitalization No Has patient had a PCN reaction occurring within the last 10 years: No If all of the above answers are "NO", then may proceed with Cephalosporin use.  . Contrast Media [Iodinated Diagnostic Agents] Hives  . Sulfonamide Derivatives Other (See Comments)    Reaction:  Unknown   . Atorvastatin Other (See Comments)     myalgias  . Pheniramine Other (See Comments)    Pt states that she passed out.   . Sulfa Antibiotics Rash   Review of Systems  Respiratory: Positive for shortness of breath.     Physical Exam Vitals reviewed.  Constitutional:      General: She is not in acute distress.    Comments: Frail appearing  Cardiovascular:     Rate and Rhythm: Tachycardia present. Rhythm irregularly irregular.     Comments: A-fib Pulmonary:     Effort: Pulmonary effort is normal.  Neurological:     Mental Status: She is alert and oriented to person, place, and time.     Vital Signs: BP 105/82   Pulse (!) 112   Temp 97.6 F (36.4 C) (Oral)   Resp 16   Ht _0  (1.651 m)   Wt 61.2 kg   SpO2 99%   BMI 22.47 kg/m  Pain Scale: Faces   Pain Score: Asleep   SpO2: SpO2: 99 % O2 Device:SpO2: 99 % O2 Flow Rate: .O2 Flow Rate (L/min): 2.5 L/min  IO: Intake/output summary: No intake or output data in the 24 hours ending 06/14/2020 1729  LBM:   Baseline Weight: Weight: 61.2 kg Most recent weight: Weight: 61.2 kg      Palliative Assessment/Data: PPS 40%     Time In: 16:28 Time Out: 17:20 Time Total: 52 minutes Greater than 50%  of this time was spent counseling and coordinating care related to the above assessment and plan.  Signed by: Lavena Bullion, NP   Please contact Palliative Medicine Team phone at [phone] for questions and concerns.  For individual provider: See Shea Evans

## 2020-06-10 NOTE — ED Notes (Signed)
Updated pt's son.  

## 2020-06-10 NOTE — Progress Notes (Signed)
  Echocardiogram 2D Echocardiogram has been performed.  Shelly Obrien Jul 08, 2020, 3:19 PM

## 2020-06-10 NOTE — ED Provider Notes (Signed)
[facility] EMERGENCY DEPARTMENT Provider Note   CSN: [id number] Arrival date & time: 06/14/2020  0305     History Chief Complaint  Patient presents with  . Code STEMI    Shelly Obrien is a 87 y.o. female with a history of persistent A. fib on Eliquis and amiodarone, hypertension, hyperlipidemia, CKD, systolic CHF with last EF 25 to 30%, hypertension, hyperlipidemia, and remote PTCA of the LAD who presents to the emergency department as a code STEMI.  Patient states that she has had shortness of breath over the past 4 days which has been getting progressively worse, it is most notable when she is trying to perform her ADLs & when she lays supine, she has had some mild associated bilateral lower leg swelling..  She has had a cough but this is chronic, denies acute change.  States that around 1 AM she developed central chest discomfort which is worse with deep breathing and is without alleviating factors.  She received nitroglycerin by EMS without much change in her pain but did drop her systolic blood pressure by 40.  She has also received 324 mg of aspirin.  She states some qualities for chest discomfort feel similar to prior MIs, however she is not entirely sure.  She does live at home alone, has had limited mobility since a hip fracture in August 2021, but is able to cook for herself and get about in her wheelchair.   HPI     Past Medical History:  Diagnosis Date  . Aortic ectasia ([place])   . Arm fracture 2013   right, Dr. Sharen Heck  . B12 deficiency   . CAD S/P percutaneous coronary angioplasty    hx MI 04/ 1994  s/p  PCI to LAD  . CKD (chronic kidney disease), stage III ([place])   . Complication of anesthesia    hard to wake   . Gallstones   . History of basal cell carcinoma (BCC) excision    right eye area 08/ 2013  . History of kidney stones 01/20/2017  . History of MI (myocardial infarction) 09/1992   s/p  PCI to LAD  . History of squamous cell carcinoma excision     right lower leg 2015 ;  2016  . Hx of colonic polyps   . Hyperlipidemia   . Hypertension   . Macular degeneration   . Osteoporosis   . PAF (paroxysmal atrial fibrillation) ([place]) dx 02-24-2016   followed by cardiologist-- dr Mamie Nick. [name]  . Pernicious anemia   . PONV (postoperative nausea and vomiting)   . Rib fracture 07/2018   left  . Right ureteral stone   . Wears glasses     Patient Active Problem List   Diagnosis Date Noted  . Surgery, elective   . Hip fracture ([place]) 01/12/2020  . Displaced fracture of right femoral neck ([place]) 01/12/2020  . Fall 01/12/2020  . Closed nondisplaced intertrochanteric fracture of right femur ([place])   . Severe sepsis ([place])   . Cellulitis of right leg 10/30/2019  . Hyperkalemia 10/30/2019  . SIRS (systemic inflammatory response syndrome) ([place]) 10/30/2019  . MGUS (monoclonal gammopathy of unknown significance) 10/22/2019  . Cellulitis 10/17/2019  . Acute kidney injury superimposed on CKD ([place]) 10/17/2019  . DNR (do not resuscitate) 10/17/2019  . Longstanding persistent atrial fibrillation ([place])   . Cardiomyopathy ([place]) 07/14/2019  . Systolic CHF ([place]) 0000000  . PAF (paroxysmal atrial fibrillation) ([place]) 07/12/2019  . Peripheral neuropathy 08/12/2018  . B12 deficiency 08/12/2018  .  Constipation   . Acute on chronic cholecystitis s/p lap cholecystectomy 12/06/2017 12/04/2017  . Nephrolithiasis 12/04/2017  . Abdominal pain 12/04/2017  . Spinal stenosis in cervical region 04/24/2017  . Spinal stenosis of lumbar region 04/24/2017  . Dizziness 04/24/2017  . Ureteral stone with hydronephrosis 01/20/2017  . Atrial fibrillation with RVR ([place]) 02/24/2016  . CKD (chronic kidney disease) stage 3, GFR 30-59 ml/min (H[facility]) 02/24/2016  . Paroxysmal atrial fibrillation ([place]) 02/24/2016  . 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 CHOLELITHIASIS 02/14/2009  . Hyperlipidemia 12/29/2008  . Essential hypertension 12/29/2008  . MYOCARDIAL INFARCTION,  HX OF 12/29/2008  . CAD (coronary artery disease) 12/29/2008    Past Surgical History:  Procedure Laterality Date  . ABDOMINAL HYSTERECTOMY    . BREAST EXCISIONAL BIOPSY Left 11/02/2009   fibroadenoma (lumpectomy)  . BROW LIFT Right 04/07/2014   Procedure: REPAIR OF ENTROPION AND TRICHIASIS OF RIGHT LOWER EYE LID ;  Surgeon: Theodoro Kos, DO;  Location: [facility];  Service: Plastics;  Laterality: Right;  . CARDIOVERSION N/A 07/18/2017   Procedure: CARDIOVERSION;  Surgeon: Skeet Latch, MD;  Location: [facility];  Service: Cardiovascular;  Laterality: N/A;  . CARDIOVERSION N/A 07/13/2019   Procedure: CARDIOVERSION;  Surgeon: Josue Hector, MD;  Location: [facility] ENDOSCOPY;  Service: Cardiovascular;  Laterality: N/A;  . CARDIOVERSION N/A 09/03/2019   Procedure: CARDIOVERSION;  Surgeon: Buford Dresser, MD;  Location: [facility] ENDOSCOPY;  Service: Cardiovascular;  Laterality: N/A;  . CATARACT EXTRACTION W/ INTRAOCULAR LENS  IMPLANT, BILATERAL    . CHOLECYSTECTOMY N/A 12/06/2017   Procedure: LAPAROSCOPIC CHOLECYSTECTOMY;  Surgeon: Jovita Kussmaul, MD;  Location: [facility];  Service: General;  Laterality: N/A;  . CORONARY ANGIOPLASTY  04/ 1994  dr Lia Foyer   PCI to LAD  . CORONARY ANGIOPLASTY    . CYSTOSCOPY W/ URETERAL STENT PLACEMENT Right 01/20/2017   Procedure: CYSTOSCOPY WITH RIGHT RETROGRADE PYELOGRAM/RIGHT URETERAL STENT PLACEMENT;  Surgeon: Alexis Frock, MD;  Location: [facility];  Service: Urology;  Laterality: Right;  . CYSTOSCOPY WITH RETROGRADE PYELOGRAM, URETEROSCOPY AND STENT PLACEMENT Right 02/15/2017   Procedure: CYSTOSCOPY WITH RETROGRADE PYELOGRAM, URETEROSCOPY, STONE BASKETRY AND STENT REPLACEMENT;  Surgeon: Alexis Frock, MD;  Location: [facility];  Service: Urology;  Laterality: Right;  . CYSTOSCOPY/RETROGRADE/URETEROSCOPY/STONE EXTRACTION WITH BASKET  yrs ago  . DILATION AND CURETTAGE OF UTERUS    . HOLMIUM LASER APPLICATION Right [id number]    Procedure: HOLMIUM LASER APPLICATION;  Surgeon: Alexis Frock, MD;  Location: [facility];  Service: Urology;  Laterality: Right;  . INTRAMEDULLARY (IM) NAIL INTERTROCHANTERIC Right 01/13/2020   Procedure: INTRAMEDULLARY (IM) NAIL INTERTROCHANTRIC;  Surgeon: Rod Can, MD;  Location: [facility];  Service: Orthopedics;  Laterality: Right;  . KNEE SURGERY     right  . NEUROPLASTY / TRANSPOSITION ULNAR NERVE AT ELBOW Left 02-23-2000    [facility]   and Left Carpal Tunnel Release  . r eyelid cancer    . TONSILLECTOMY    . TRANSTHORACIC ECHOCARDIOGRAM  02/26/2016   moderate focal basal and mild concentric LVH,  ef 55-60%,  akinesis of the basal-midanteroseptal, inferoseptal and apicalinferior myocardium, study not sufficient to evaluation diastolic funciton due to atrial fib./  trivial PR/  mild TR  . UMBILICAL HERNIA REPAIR       OB History   No obstetric history on file.     Family History  Problem Relation Age of Onset  . Coronary artery disease Other        positive family hx of  .  Cancer Other        family hx of  . Brain cancer Son   . Liver cancer Daughter   . Neuropathy Neg Hx     Social History   Tobacco Use  . Smoking status: Former Smoker    Years: 5.00    Types: Cigarettes  . Smokeless tobacco: Never Used  . Tobacco comment: "didn't have the habit that much", just smoked "with the girls"  Vaping Use  . Vaping Use: Never used  Substance Use Topics  . Alcohol use: No    Alcohol/week: 0.0 standard drinks  . Drug use: No    Home Medications Prior to Admission medications   Medication Sig Start Date End Date Taking? Authorizing Provider  amiodarone (PACERONE) 200 MG tablet Take 200 mg by mouth 2 (two) times daily. 11/02/19   [provider]  DHA-EPA-Flaxseed Oil-Vitamin E (THERA TEARS NUTRITION PO) Take 1 drop by mouth daily as needed (watery eyes).    [provider]  fluocinonide (LIDEX) 0.05 % external solution Apply 1 application  topically as needed (itching).  06/15/19   [provider]  furosemide (LASIX) 40 MG tablet Take 40 mg by mouth daily. 02/13/20   [provider]  Multiple Vitamins-Minerals (ICAPS) CAPS Take 1 capsule by mouth 2 (two) times daily.     [provider]  Phenylephrine HCl (NOSE DROPS NA) Place 1 drop into the nose daily as needed (alleriges).    [provider]  Rivaroxaban (XARELTO) 15 MG TABS tablet Take 1 tablet (15 mg total) by mouth daily with supper. 05/05/20   Richardson Dopp T, PA-C  triamcinolone (KENALOG) 0.025 % cream Apply 1 application topically as needed (itching).  06/15/19   [provider]    Allergies    Antihistamines, chlorpheniramine-type; Penicillins; Contrast media [iodinated diagnostic agents]; Sulfonamide derivatives; Atorvastatin; and Pheniramine  Review of Systems   Review of Systems  Constitutional: Negative for fever.  Respiratory: Positive for cough (baseline) and shortness of breath.   Cardiovascular: Positive for chest pain and leg swelling.  Gastrointestinal: Negative for abdominal pain and vomiting.  Neurological: Negative for syncope.  All other systems reviewed and are negative.   Physical Exam Updated Vital Signs BP 128/88   Pulse 95   Temp (!) 97.3 F (36.3 C) (Oral)   Resp 15   Ht 5\' 5"  (1.651 m)   Wt 61.2 kg   SpO2 91%   BMI 22.47 kg/m   Physical Exam Vitals and nursing note reviewed.  Constitutional:      Appearance: She is well-developed. She is ill-appearing.  HENT:     Head: Normocephalic and atraumatic.  Eyes:     General:        Right eye: No discharge.        Left eye: No discharge.     Conjunctiva/sclera: Conjunctivae normal.  Cardiovascular:     Rate and Rhythm: Tachycardia present. Rhythm irregular.  Pulmonary:     [place]: No respiratory distress.     Breath sounds: No wheezing or rhonchi.     Comments: Decreased breath sounds at the bases, faint rales. SPO2 90 to 94% on RA on  initial assessment.  Abdominal:     General: There is no distension.     Palpations: Abdomen is soft.     Tenderness: There is no abdominal tenderness. There is no guarding or rebound.  Musculoskeletal:     Cervical back: Neck supple.     Comments: 1+ symmetric bilateral lower  extremity pitting edema.  Left lower extremity does have some mild scabbed areas present but no significant surrounding erythema or increased warmth or purulent drainage.  Skin:    General: Skin is warm and dry.     Findings: No rash.  Neurological:     Mental Status: She is alert.     Comments: Clear speech.   Psychiatric:        Behavior: Behavior normal.     ED Results / Procedures / Treatments   Labs (all labs ordered are listed, but only abnormal results are displayed) Labs Reviewed  CBC - Abnormal; Notable for the following components:      Result Value   Hemoglobin 11.5 (*)    RDW 20.7 (*)    All other components within normal limits  HEMOGLOBIN A1C - Abnormal; Notable for the following components:   Hgb A1c MFr Bld 6.0 (*)    All other components within normal limits  PROTIME-INR - Abnormal; Notable for the following components:   Prothrombin Time 20.5 (*)    INR 1.8 (*)    All other components within normal limits  COMPREHENSIVE METABOLIC PANEL - Abnormal; Notable for the following components:   Glucose, Bld 124 (*)    BUN 27 (*)    Creatinine, Ser 2.52 (*)    Albumin 3.3 (*)    AST 83 (*)    Total Bilirubin 1.4 (*)    GFR, Estimated 17 (*)    All other components within normal limits  I-STAT CHEM 8, ED - Abnormal; Notable for the following components:   BUN 40 (*)    Creatinine, Ser 2.40 (*)    Glucose, Bld 105 (*)    All other components within normal limits  TROPONIN I (HIGH SENSITIVITY) - Abnormal; Notable for the following components:   Troponin I (High Sensitivity) 12,241 (*)    All other components within normal limits  RESP PANEL BY RT-PCR (FLU A&B, COVID) ARPGX2  APTT   LIPID PANEL  BRAIN NATRIURETIC PEPTIDE  TROPONIN I (HIGH SENSITIVITY)    EKG EKG Interpretation  Date/Time:  Friday June 10 2020 03:13:07 EST Ventricular Rate:  104 PR Interval:    QRS Duration: 133 QT Interval:  376 QTC Calculation: 495 R Axis:   -61 Text Interpretation: Atrial fibrillation Nonspecific IVCD with LAD Left ventricular hypertrophy Inferior infarct, acute (RCA) Anterior infarct, acute Lateral leads are also involved Probable RV involvement, suggest recording right precordial leads >>> Acute MI <<< Confirmed by Merrily Pew [phone]) on 06/05/2020 3:43:02 AM   Radiology DG Chest Portable 1 View  Result Date: 06/16/2020 CLINICAL DATA:  Chest pain, dyspnea EXAM: PORTABLE CHEST 1 VIEW COMPARISON:  01/14/2020 FINDINGS: Since the prior examination, moderate right and probable tiny left pleural effusions have developed. There is interval development of moderate interstitial pulmonary infiltrate, asymmetrically more severe throughout the right lung. Parenchymal cyst is again noted within the right mid lung zone, more conspicuous due to overlying asymmetric pulmonary infiltrate. No pneumothorax. Mild cardiomegaly appears unchanged though the cardiac silhouette is partially obscured by pleural fluid. No acute bone abnormality. IMPRESSION: Diffuse interstitial pulmonary infiltrate and bilateral pleural effusions, right greater than left, most suggestive of mild to moderate cardiogenic failure with asymmetric edema involving the right lung. In Electronically Signed   By: Fidela Salisbury MD   On: 06/06/2020 03:41    Procedures .Critical Care Performed by: Amaryllis Dyke, PA-C Authorized by: Amaryllis Dyke, PA-C    CRITICAL CARE Performed by: Kennith Maes  Total critical care time: 45 minutes  Critical care time was exclusive of separately billable procedures and treating other patients.  Critical care was necessary to treat or prevent imminent or  life-threatening deterioration.  Critical care was time spent personally by me on the following activities: development of treatment plan with patient and/or surrogate as well as nursing, discussions with consultants, evaluation of patient's response to treatment, examination of patient, obtaining history from patient or surrogate, ordering and performing treatments and interventions, ordering and review of laboratory studies, ordering and review of radiographic studies, pulse oximetry and re-evaluation of patient's condition.  (including critical care time)  Medications Ordered in ED Medications  heparin sodium (porcine) injection 4,000 Units (4,000 Units Intravenous Given 2020-06-29 0338)  furosemide (LASIX) injection 40 mg (40 mg Intravenous Given 29-Jun-2020 0353)    ED Course  I have reviewed the triage vital signs and the nursing notes.  Pertinent labs & imaging results that were available during my care of the patient were reviewed by me and considered in my medical decision making (see chart for details).    MDM Rules/Calculators/A&P                         Patient presents to the emergency department via EMS with complaints of shortness of breath over the past 4 days and chest pain that started a couple of hours prior to arrival.  Patient is somewhat ill-appearing mildly tachycardic, currently in A. fib which she is in persistently.  Was initially activated as a code STEMI in the field, cardiologist  Dr. Martinique (STEMI doc) cancelled this prior to arrival given age/comorbidities with cardiologist on call Dr. Hassell Done to evaluate patient in the ED- following her evaluation code STEMI was re-activated.   Additional history obtained:  Additional history obtained from EMS & chart review.  Last echocardiogram 07/13/2019 which revealed an EF of 25 to 30%  Imaging Studies ordered:  I ordered imaging studies which included CXR, I independently visualized and interpreted imaging which showed Diffuse  interstitial pulmonary infiltrate and bilateral pleural effusions, right greater than left, most suggestive of mild to moderate cardiogenic failure with asymmetric edema involving the right lung.  Pending Dr. Doug Sou assessment of the patient heparin was initiated as well as Lasix given patient's CXR findings per discussion with cardiology team.   Dr. Martinique subsequently assessed patient @ the bedside and felt she was a poor candidate for emergent invasive evaluation with cardiac cath/PCI, also was felt to be a poor candidate previously by her primary cardiologist, re-cancelled code STEMI.   Lab Tests:  I Ordered, reviewed, and interpreted labs, which included:  CBC: Anemia improved from prior. CMP: Acute kidney injury with creatinine 2.52 which is increased from most recent 1.40.  Troponin: Significantly elevated at 12,241  ED Course:  Patient received heparin shortly following arrival.  Following chest x-ray review Lasix was given. Code STEMI ultimately cancelled by STEMI attending.   Patient with findings concerning for acute CHF, she remains in A. fib, with her significantly elevated troponin there remains concern for ischemia. Considered PE as well however reports compliance with eliquis and with degree of her troponin elevation feel this would be somewhat less likely, she also is not a candidate for CTA with her renal function, she has received heparin.  She is currently on 2 L via nasal cannula as she did desaturate into the upper 80s.  I rediscussed findings and plan of care with cardiologist Dr. Hassell Done- recommends  medicine admission, cardiology service will follow.  Please see cardiology notes for recommendations.  06:11: CONSULT: Discussed with hospitalist Dr. Hal Hope- accepts admission.   This is a shared visit with supervising physician Dr. Hassell Done who has independently evaluated patient & provided guidance in evaluation/management/disposition, in agreement with care   Portions  of this note were generated with Dragon dictation software. Dictation errors may occur despite best attempts at proofreading.  Final Clinical Impression(s) / ED Diagnoses Final diagnoses:  Chest pain, unspecified type  Acute kidney injury ([place])  Acute congestive heart failure, unspecified heart failure type ([place])  Elevated troponin    Rx / DC Orders ED Discharge Orders    None       Amaryllis Dyke, PA-C 06/12/2020 [id number]    Mesner, Corene Cornea, MD 06/09/2020 [phone]

## 2020-06-10 NOTE — ED Notes (Signed)
Hospitalist messaged about pts HR ranging from 110-130 in a fib. Awaiting reply

## 2020-06-10 NOTE — ED Notes (Signed)
Please call pt's son Mr. Alroy Dust [phone] with update

## 2020-06-10 NOTE — Progress Notes (Signed)
[place] for heparin Indication: chest pain/ACS  Allergies  Allergen Reactions  . Antihistamines, Chlorpheniramine-Type Other (See Comments)    Pt states that she passed out.   [name] Penicillins Anaphylaxis and Other (See Comments)    Has patient had a PCN reaction causing immediate rash, facial/tongue/throat swelling, SOB or lightheadedness with hypotension: Yes Has patient had a PCN reaction causing severe rash involving mucus membranes or skin necrosis: No Has patient had a PCN reaction that required hospitalization No Has patient had a PCN reaction occurring within the last 10 years: No If all of the above answers are "NO", then may proceed with Cephalosporin use.  . Contrast Media [Iodinated Diagnostic Agents] Hives  . Sulfonamide Derivatives Other (See Comments)    Reaction:  Unknown   . Atorvastatin Other (See Comments)    myalgias  . Pheniramine Other (See Comments)    Pt states that she passed out.   . Sulfa Antibiotics Rash    Patient Measurements: Height: 5\' 5"  (165.1 cm) Weight: 61.2 kg (135 lb) IBW/kg (Calculated) : 57 Heparin Dosing Weight: 61.2 kg   Vital Signs: Temp: 97.2 F (36.2 C) (01/07 1914) Temp Source: Oral (01/07 1914) BP: 112/74 (01/07 1914) Pulse Rate: 114 (01/07 1914)  Labs: Recent Labs    July 01, 2020 0329 07/01/2020 0535 2020-07-01 0554 Jul 01, 2020 1717  HGB 11.5*  --  13.3  --   HCT 38.0  --  39.0  --   PLT 275  --   --   --   APTT 32  --   --  52*  LABPROT 20.5*  --   --   --   INR 1.8*  --   --   --   CREATININE 2.52*  --  2.40*  --   TROPONINIHS 12,241* 12,017*  --   --     Estimated Creatinine Clearance: 12.6 mL/min (A) (by C-G formula based on SCr of 2.4 mg/dL (H)).   Medical History: Past Medical History:  Diagnosis Date  . Aortic ectasia ([place])   . Arm fracture 2013   right, Dr. Sharen Heck  . B12 deficiency   . CAD S/P percutaneous coronary angioplasty    hx MI 04/ 1994  s/p  PCI to LAD  . CKD  (chronic kidney disease), stage III ([place])   . Complication of anesthesia    hard to wake   . Gallstones   . History of basal cell carcinoma (BCC) excision    right eye area 08/ 2013  . History of kidney stones 01/20/2017  . History of MI (myocardial infarction) 09/1992   s/p  PCI to LAD  . History of squamous cell carcinoma excision    right lower leg 2015 ;  2016  . Hx of colonic polyps   . Hyperlipidemia   . Hypertension   . Macular degeneration   . Osteoporosis   . PAF (paroxysmal atrial fibrillation) ([place]) dx 02-24-2016   followed by cardiologist-- dr Mamie Nick. [name]  . Pernicious anemia   . PONV (postoperative nausea and vomiting)   . Rib fracture 07/2018   left  . Right ureteral stone   . Wears glasses     Medications:  (Not in a hospital admission)   Assessment: 25 YOF who presents with chest pain to start IV heparin for ACS/STEMI. Planning medical management given her advanced age. [name], patient is on apixaban at home and last dose was yesterday evening. She also received a bolus dose of IV  heparin 4000 units this morning at 0338.   Initial aPTT subtherapeutic at 52 seconds on 800 units/hr   Goal of Therapy:  Heparin level 0.3-0.7 units/ml aPTT 66-102 seconds Monitor platelets by anticoagulation protocol: Yes   Plan:  Increase heparin gtt to 900 units/hr F/u 8 hour aPTT with AM labs Monitor cards plan, LOT, ability to transition back to [place], PharmD Clinical Pharmacist ED Pharmacist Phone # [phone] 06/09/2020 7:38 PM

## 2020-06-11 ENCOUNTER — Other Ambulatory Visit: Payer: Self-pay

## 2020-06-11 DIAGNOSIS — I509 Heart failure, unspecified: Secondary | ICD-10-CM

## 2020-06-11 DIAGNOSIS — I213 ST elevation (STEMI) myocardial infarction of unspecified site: Secondary | ICD-10-CM | POA: Diagnosis not present

## 2020-06-11 LAB — CBC WITH DIFFERENTIAL/PLATELET
Abs Immature Granulocytes: 0.09 10*3/uL — ABNORMAL HIGH (ref 0.00–0.07)
Basophils Absolute: 0 10*3/uL (ref 0.0–0.1)
Basophils Relative: 0 %
Eosinophils Absolute: 0 10*3/uL (ref 0.0–0.5)
Eosinophils Relative: 0 %
HCT: 37.4 % (ref 36.0–46.0)
Hemoglobin: 11.3 g/dL — ABNORMAL LOW (ref 12.0–15.0)
Immature Granulocytes: 1 %
Lymphocytes Relative: 8 %
Lymphs Abs: 1 10*3/uL (ref 0.7–4.0)
MCH: 25.8 pg — ABNORMAL LOW (ref 26.0–34.0)
MCHC: 30.2 g/dL (ref 30.0–36.0)
MCV: 85.4 fL (ref 80.0–100.0)
Monocytes Absolute: 1.9 10*3/uL — ABNORMAL HIGH (ref 0.1–1.0)
Monocytes Relative: 17 %
Neutro Abs: 8.7 10*3/uL — ABNORMAL HIGH (ref 1.7–7.7)
Neutrophils Relative %: 74 %
Platelets: 244 10*3/uL (ref 150–400)
RBC: 4.38 MIL/uL (ref 3.87–5.11)
RDW: 20.6 % — ABNORMAL HIGH (ref 11.5–15.5)
WBC: 11.7 10*3/uL — ABNORMAL HIGH (ref 4.0–10.5)
nRBC: 0.4 % — ABNORMAL HIGH (ref 0.0–0.2)

## 2020-06-11 LAB — BASIC METABOLIC PANEL
Anion gap: 12 (ref 5–15)
BUN: 34 mg/dL — ABNORMAL HIGH (ref 8–23)
CO2: 27 mmol/L (ref 22–32)
Calcium: 9.5 mg/dL (ref 8.9–10.3)
Chloride: 103 mmol/L (ref 98–111)
Creatinine, Ser: 2.74 mg/dL — ABNORMAL HIGH (ref 0.44–1.00)
GFR, Estimated: 15 mL/min — ABNORMAL LOW (ref >60)
Glucose, Bld: 123 mg/dL — ABNORMAL HIGH (ref 70–99)
Potassium: 5.1 mmol/L (ref 3.5–5.1)
Sodium: 142 mmol/L (ref 135–145)

## 2020-06-11 LAB — BLOOD GAS, ARTERIAL
Acid-base deficit: 0.5 mmol/L (ref 0.0–2.0)
Bicarbonate: 25.4 mmol/L (ref 20.0–28.0)
Drawn by: 246861
FIO2: 32
O2 Saturation: 97.3 %
Patient temperature: 37
pCO2 arterial: 55.5 mmHg — ABNORMAL HIGH (ref 32.0–48.0)
pH, Arterial: 7.282 — ABNORMAL LOW (ref 7.350–7.450)
pO2, Arterial: 107 mmHg (ref 83.0–108.0)

## 2020-06-11 LAB — APTT
aPTT: 75 seconds — ABNORMAL HIGH (ref 24–36)
aPTT: 85 seconds — ABNORMAL HIGH (ref 24–36)

## 2020-06-11 LAB — MAGNESIUM: Magnesium: 2.3 mg/dL (ref 1.7–2.4)

## 2020-06-11 LAB — HEPARIN LEVEL (UNFRACTIONATED): Heparin Unfractionated: >2.2 IU/mL — ABNORMAL HIGH (ref 0.30–0.70)

## 2020-06-11 MED ORDER — METOPROLOL SUCCINATE ER 25 MG PO TB24
25.0000 mg | ORAL_TABLET | Freq: Two times a day (BID) | ORAL | Status: DC
  Administered 2020-06-11 – 2020-06-12 (×2): 25 mg via ORAL
  Filled 2020-06-11 (×3): qty 1

## 2020-06-11 MED ORDER — ORAL CARE MOUTH RINSE
15.0000 mL | Freq: Two times a day (BID) | OROMUCOSAL | Status: DC
  Administered 2020-06-11 – 2020-06-13 (×5): 15 mL via OROMUCOSAL

## 2020-06-11 NOTE — Progress Notes (Addendum)
Progress Note  Patient Name: Shelly Obrien Date of Encounter: 06/11/2020  [facility] Cardiologist: Mertie Moores, MD   Subjective   "My breathing is a little short   NO CP "  Inpatient Medications    Scheduled Meds: . amiodarone  200 mg Oral BID  . aspirin EC  81 mg Oral Daily  . furosemide  40 mg Intravenous BID  . mouth rinse  15 mL Mouth Rinse BID  . sodium chloride flush  3 mL Intravenous Q12H   Continuous Infusions: . sodium chloride    . heparin 900 Units/hr (06/27/2020 2143)   PRN Meds: sodium chloride, acetaminophen, ondansetron (ZOFRAN) IV, polyvinyl alcohol, sodium chloride flush   Vital Signs    Vitals:   06/17/2020 2224 06/19/2020 2308 06/11/20 0150 06/11/20 0816  BP: 108/87 114/87 118/88 (!) 116/94  Pulse: (!) 114 (!) 101 (!) 52 86  Resp: 17 16 19 17   Temp: 97.6 F (36.4 C) 98.1 F (36.7 C) 98.5 F (36.9 C) (!) 97 F (36.1 C)  TempSrc: Oral Oral Oral Axillary  SpO2: 97% 100% 96% 98%  Weight: 70 kg     Height: 5\' 5"  (1.651 m)       Intake/Output Summary (Last 24 hours) at 06/11/2020 1028 Last data filed at 06/11/2020 0858 Gross per 24 hour  Intake 987.3 ml  Output --  Net 987.3 ml   Last 3 Weights 06/22/2020 07/02/2020 04/22/2020  Weight (lbs) 154 lb 5.2 oz 135 lb 160 lb  Weight (kg) 70 kg 61.236 kg 72.576 kg      Telemetry    Afib rate 90s to 110s  - Personally Reviewed  ECG    NO new - Personally Reviewed  Physical Exam  Pleasant older, somewhat frail appearing white female GEN: No acute distress.   Neck: JVP is increased  Cardiac: Irreg Irreg, no murmurs, rubs, or gallops.  Respiratory: Mild crackles L base   GI: Soft, nontender, non-distended  MS: NO LE  LE edema; No deformity. Neuro:  Nonfocal  Psych: Normal affect   Labs    High Sensitivity Troponin:   Recent Labs  Lab 06/28/2020 0329 06/15/2020 0535  TROPONINIHS 12,241* 12,017*      Chemistry Recent Labs  Lab 06/30/2020 0329 06/25/2020 0554 06/11/20 0230  NA 143 143 142   K 5.1 5.1 5.1  CL 106 106 103  CO2 25  --  27  GLUCOSE 124* 105* 123*  BUN 27* 40* 34*  CREATININE 2.52* 2.40* 2.74*  CALCIUM 9.7  --  9.5  PROT 6.6  --   --   ALBUMIN 3.3*  --   --   AST 83*  --   --   ALT 31  --   --   ALKPHOS 75  --   --   BILITOT 1.4*  --   --   GFRNONAA 17*  --  15*  ANIONGAP 12  --  12     Hematology Recent Labs  Lab 06/27/2020 0329 07/04/2020 0554 06/11/20 0230  WBC 8.8  --  11.7*  RBC 4.42  --  4.38  HGB 11.5* 13.3 11.3*  HCT 38.0 39.0 37.4  MCV 86.0  --  85.4  MCH 26.0  --  25.8*  MCHC 30.3  --  30.2  RDW 20.7*  --  20.6*  PLT 275  --  244    BNP Recent Labs  Lab 07/03/2020 0329  BNP 2,431.5*     DDimer No results for input(s):  DDIMER in the last 168 hours.   Radiology    DG Chest Portable 1 View  Result Date: 06/18/2020 CLINICAL DATA:  Chest pain, dyspnea EXAM: PORTABLE CHEST 1 VIEW COMPARISON:  01/14/2020 FINDINGS: Since the prior examination, moderate right and probable tiny left pleural effusions have developed. There is interval development of moderate interstitial pulmonary infiltrate, asymmetrically more severe throughout the right lung. Parenchymal cyst is again noted within the right mid lung zone, more conspicuous due to overlying asymmetric pulmonary infiltrate. No pneumothorax. Mild cardiomegaly appears unchanged though the cardiac silhouette is partially obscured by pleural fluid. No acute bone abnormality. IMPRESSION: Diffuse interstitial pulmonary infiltrate and bilateral pleural effusions, right greater than left, most suggestive of mild to moderate cardiogenic failure with asymmetric edema involving the right lung. In Electronically Signed   By: Fidela Salisbury MD   On: 06/16/2020 03:41   ECHOCARDIOGRAM COMPLETE  Result Date: 06/17/2020    ECHOCARDIOGRAM REPORT   Patient Name:   Shelly Obrien Date of Exam: 06/04/2020 Medical Rec #:  [id number]     Height:       65.0 in Accession #:    [id number]    Weight:       135.0 lb Date of  Birth:  10-08-1922     BSA:          1.674 m Patient Age:    87 years      BP:           111/92 mmHg Patient Gender: F             HR:           127 bpm. Exam Location:  Inpatient Procedure: 2D Echo, Cardiac Doppler, Color Doppler and Intracardiac            Opacification Agent Indications:    STEMI I21.3  History:        Patient has prior history of Echocardiogram examinations, most                 recent 07/13/2019. Previous Myocardial Infarction and CAD,                 Arrythmias:Atrial Fibrillation; Risk Factors:Hypertension,                 Dyslipidemia and Former Smoker.  Sonographer:    Vickie Epley RDCS Referring Phys: [id number] RONDELL A SMITH IMPRESSIONS  1. Left ventricular ejection fraction, by estimation, is <20%. The left ventricle has severely decreased function. The left ventricle demonstrates global hypokinesis. The left ventricular internal cavity size was mildly dilated. Left ventricular diastolic parameters are indeterminate.  2. Right ventricular systolic function is moderately reduced. The right ventricular size is mildly enlarged. There is moderately elevated pulmonary artery systolic pressure. The estimated right ventricular systolic pressure is 123456 mmHg.  3. Left atrial size was mildly dilated.  4. Right atrial size was moderately dilated.  5. The mitral valve is normal in structure. Trivial mitral valve regurgitation. No evidence of mitral stenosis.  6. The aortic valve is tricuspid. Aortic valve regurgitation is trivial. Mild to moderate aortic valve sclerosis/calcification is present, without any evidence of aortic stenosis.  7. The inferior vena cava is dilated in size with <50% respiratory variability, suggesting right atrial pressure of 15 mmHg. FINDINGS  Left Ventricle: Left ventricular ejection fraction, by estimation, is <20%. The left ventricle has severely decreased function. The left ventricle demonstrates global hypokinesis. Definity contrast agent was given IV to delineate the  left  ventricular endocardial borders. The left ventricular internal cavity size was mildly dilated. There is no left ventricular hypertrophy. Left ventricular diastolic parameters are indeterminate. Right Ventricle: The right ventricular size is mildly enlarged. No increase in right ventricular wall thickness. Right ventricular systolic function is moderately reduced. There is moderately elevated pulmonary artery systolic pressure. The tricuspid regurgitant velocity is 2.92 m/s, and with an assumed right atrial pressure of 15 mmHg, the estimated right ventricular systolic pressure is 123456 mmHg. Left Atrium: Left atrial size was mildly dilated. Right Atrium: Right atrial size was moderately dilated. Pericardium: There is no evidence of pericardial effusion. Mitral Valve: The mitral valve is normal in structure. Trivial mitral valve regurgitation. No evidence of mitral valve stenosis. Tricuspid Valve: The tricuspid valve is normal in structure. Tricuspid valve regurgitation is mild. Aortic Valve: The aortic valve is tricuspid. Aortic valve regurgitation is trivial. Mild to moderate aortic valve sclerosis/calcification is present, without any evidence of aortic stenosis. Pulmonic Valve: The pulmonic valve was normal in structure. Pulmonic valve regurgitation is not visualized. Aorta: The aortic root is normal in size and structure. Venous: The inferior vena cava is dilated in size with less than 50% respiratory variability, suggesting right atrial pressure of 15 mmHg. IAS/Shunts: No atrial level shunt detected by color flow Doppler.  LEFT VENTRICLE PLAX 2D LVOT diam:     2.30 cm LV SV:         23 LV SV Index:   14 LVOT Area:     4.15 cm  RIGHT VENTRICLE TAPSE (M-mode): 0.7 cm LEFT ATRIUM             Index       RIGHT ATRIUM           Index LA Vol (A2C):   58.1 ml 34.71 ml/m RA Area:     26.80 cm LA Vol (A4C):   64.6 ml 38.59 ml/m RA Volume:   102.00 ml 60.94 ml/m LA Biplane Vol: 65.3 ml 39.01 ml/m  AORTIC VALVE  LVOT Vmax:   45.00 cm/s LVOT Vmean:  30.600 cm/s LVOT VTI:    0.055 m  AORTA Ao Root diam: 3.20 cm Ao Asc diam:  3.60 cm MR Peak grad: 24.0 mmHg   TRICUSPID VALVE MR Vmax:      245.00 cm/s TR Peak grad:   34.1 mmHg                           TR Vmax:        292.00 cm/s                            SHUNTS                           Systemic VTI:  0.05 m                           Systemic Diam: 2.30 cm Loralie Champagne MD Electronically signed by Loralie Champagne MD Signature Date/Time: 06/06/2020/5:23:32 PM    Final     Cardiac Studies   ECHO   06/17/2020  1. Left ventricular ejection fraction, by estimation, is <20%. The left ventricle has severely decreased function. The left ventricle demonstrates global hypokinesis. The left ventricular internal cavity size was mildly dilated. Left ventricular diastolic parameters are indeterminate. 2. Right ventricular systolic function is  moderately reduced. The right ventricular size is mildly enlarged. There is moderately elevated pulmonary artery systolic pressure. The estimated right ventricular systolic pressure is 74.9 mmHg. 3. Left atrial size was mildly dilated. 4. Right atrial size was moderately dilated. 5. The mitral valve is normal in structure. Trivial mitral valve regurgitation. No evidence of mitral stenosis. 6. The aortic valve is tricuspid. Aortic valve regurgitation is trivial. Mild to moderate aortic valve sclerosis/calcification is present, without any evidence of aortic stenosis. 7. The inferior vena cava is dilated in size with <50% respiratory variability, suggesting right atrial pressure of 15 mmHg.Echo: 07/2019  IMPRESSIONS    1. Left ventricular ejection fraction, by estimation, is 25 to 30%. The  left ventricle has severely decreased function. The left ventricle  demonstrates regional wall motion abnormalities (see scoring  diagram/findings for description). Diffuse  hypokinesis with inferoseptal akinesis. Left ventricular diastolic   parameters are indeterminate.  2. Right ventricular systolic function is normal. There is moderately  elevated pulmonary artery systolic pressure. The estimated right  ventricular systolic pressure is 44.9 mmHg.  3. Mild mitral valve regurgitation.  4. The aortic valve is tricuspid. Aortic sclerosis without significant  stenosis. No regurgitation.  5. Dilated IVC with estimate RA pressure 15 mmHg.  6. The patient was in atrial fibrillation.   Patient Profile     87 y.o. female  with a hx of CAD and remote h/o MI/PCI, chronic/persistent AF on eliquis for CVA prophylaxis, ICM, CKD, MGUS, recent hip fracture in August of 2021 s/p intramedullary fixation on 01-13-20, HTN who is being seen today for the evaluation of CP/DOE and abnormal EKG showing possible STEMI at the request of Dr. Dayna Barker [facility]).  Assessment & Plan    1. NSTEMI with abnormal EKG: suspect she could have had plaque rupture for true STEMI but given advanced age and co-morbidities, plan is to treat conservatively. Has some mild left arm discomfort this morning. hsTn [id number]>>[id number].  -- continue IV heparin likely total of 48hrs, add ASA given Eliquis is held. Has been intolerant to statins in the past (multiple allergies reported). Consider adding low dose BB if blood pressures can tolerate -- echo above   WIll need to watch I/O     2. Chronic Afib: has been on Eliquis and amiodarone prior to admission. Eliquis held, now on IV heparin with NSTEMI.  -- continue amiodarone, Add low dose TOprol XL  FOllow BP and HR  3. Acute on chronic systolic HF: k Echo noted above   Stopping lasix due to bump in Cr   Folllow  I/O   4. AKI on CKD: baseline around 1.4. Cr 2.74  Now  Stop lasix     For questions or updates, please contact [place] Please consult [email or web address] for contact info under        Signed, Dorris Carnes, MD  06/11/2020, 10:28 AM

## 2020-06-11 NOTE — Plan of Care (Signed)
  Problem: Education: Goal: Knowledge of General Education information will improve Description: Including pain rating scale, medication(s)/side effects and non-pharmacologic comfort measures Outcome: Progressing   Problem: Health Behavior/Discharge Planning: Goal: Ability to manage health-related needs will improve Outcome: Progressing   Problem: Clinical Measurements: Goal: Ability to maintain clinical measurements within normal limits will improve Outcome: Progressing Goal: Will remain free from infection Outcome: Progressing Goal: Diagnostic test results will improve Outcome: Progressing Goal: Respiratory complications will improve Outcome: Progressing Goal: Cardiovascular complication will be avoided Outcome: Progressing   Problem: Pain Managment: Goal: General experience of comfort will improve Outcome: Progressing   Problem: Safety: Goal: Ability to remain free from injury will improve Outcome: Progressing   

## 2020-06-11 NOTE — Progress Notes (Signed)
   07/10/2020 2224  Assess: MEWS Score  Temp 97.6 F (36.4 C)  BP 108/87  Pulse Rate (!) 114  ECG Heart Rate (!) 114  Resp 17  SpO2 97 %  O2 Device Nasal Cannula  O2 Flow Rate (L/min) 2 L/min  Assess: MEWS Score  MEWS Temp 0  MEWS Systolic 0  MEWS Pulse 2  MEWS RR 0  MEWS LOC 0  MEWS Score 2  MEWS Score Color Yellow  Assess: if the MEWS score is Yellow or Red  Were vital signs taken at a resting state? Yes  Focused Assessment No change from prior assessment  Early Detection of Sepsis Score *See Row Information* Low  MEWS guidelines implemented *See Row Information* Yes  Take Vital Signs  Increase Vital Sign Frequency  Yellow: Q 2hr X 2 then Q 4hr X 2, if remains yellow, continue Q 4hrs  Escalate  MEWS: Escalate Yellow: discuss with charge nurse/RN and consider discussing with provider and RRT  Notify: Charge Nurse/RN  Name of Charge Nurse/RN Notified Heather RN  Date Charge Nurse/RN Notified 07-10-20  Time Charge Nurse/RN Notified 2300  Document  Patient Outcome Other (Comment) (stable-cardiac med given.)

## 2020-06-11 NOTE — Plan of Care (Signed)

## 2020-06-11 NOTE — Evaluation (Signed)
Occupational Therapy Evaluation Patient Details Name: Shelly Obrien MRN: [id number] DOB: November 20, 1922 Today's Date: 06/11/2020    History of Present Illness 87 y.o. female with medical history significant of HTN, HLD, CAD S/P PCI in 36', A. fib on Eliquis, systolic CHF, last EF 30-07%, and CKD stage III resident with complaints of chest pain.  Associated symptoms included shortness of breath which has worsened over the last week, nonproductive cough, fatigue, lower extremity swelling, and weight loss with a few pounds.  She had suffered a right femur fracture back in August 2021 treated with intramedullary fixation.   Clinical Impression   Patient admitted for the above diagnosis.  PTA she was living in a condo at an ILF at [employer] level.  She used the wheelchair in the kitchen, and the RW to access outdoors, and across her carpeted areas.  She was still driving, and was able to manage her meds and bills.  Currently, due to the deficits listed below, she is needing Mod A for mobility, up to [place] for ADL support and Min to Mod A for toileting.  Based on her poor dynamic stand balance and decreased ADL status, unless she has 24 hour assist, a SNF for post acute rehab is recommended.  If her son can stay with her, while [facility] PT/OT work with her, and she stays at wheelchair level, home is a possibility.  She is a high fall risk at this time.      Follow Up Recommendations  SNF    Equipment Recommendations  3 in 1 bedside commode    Recommendations for Other Services       Precautions / Restrictions Precautions Precautions: Fall Restrictions Weight Bearing Restrictions: No Other Position/Activity Restrictions: O2 sats and HR      Mobility Bed Mobility Overal bed mobility: Needs Assistance Bed Mobility: Supine to Sit     Supine to sit: Min guard     General bed mobility comments: movs quickly    Transfers Overall transfer level: Needs assistance Equipment used: Rolling walker  (2 wheeled) Transfers: Sit to/from [facility] Sit to Stand: Min assist Stand pivot transfers: Mod assist            Balance Overall balance assessment: Needs assistance Sitting-balance support: No upper extremity supported Sitting balance-Leahy Scale: Fair     Standing balance support: Bilateral upper extremity supported Standing balance-Leahy Scale: Poor Standing balance comment: relies on RW                           ADL either performed or assessed with clinical judgement   ADL Overall ADL's : Needs assistance/impaired                 Upper Body Dressing : Minimal assistance;Sitting   Lower Body Dressing: Minimal assistance;Sitting/lateral leans Lower Body Dressing Details (indicate cue type and reason): difficulty reaching for feet, loss of  balance backwards.             Functional mobility during ADLs: Moderate assistance;Rolling walker General ADL Comments: reaching for objects, taking her hands off the RW, losing balance, and ? R knee buckle.     Vision Baseline Vision/History: Macular Degeneration Patient Visual Report: No change from baseline       Perception     Praxis      Pertinent Vitals/Pain Pain Assessment: No/denies pain     Hand Dominance Right   Extremity/Trunk Assessment Upper Extremity Assessment Upper Extremity  Assessment: LUE deficits/detail;Generalized weakness LUE Deficits / Details: decreased end range for shoulder flexion - ? RCT LUE Sensation: WNL LUE Coordination: WNL   Lower Extremity Assessment Lower Extremity Assessment: Defer to PT evaluation   Cervical / Trunk Assessment Cervical / Trunk Assessment: Kyphotic   Communication Communication Communication: No difficulties   Cognition Arousal/Alertness: Awake/alert Behavior During Therapy: WFL for tasks assessed/performed Overall Cognitive Status: No family/caregiver present to determine baseline cognitive functioning                                  General Comments: patient following commands well, difficulty with complex thought, but HOH may be playing a part in that.  Will continue to assess and monitor.   General Comments   nursing notified by PT O2 sats were inconsistent and probably not correct.      Exercises     Shoulder Instructions      Home Living Family/patient expects to be discharged to:: Private residence Living Arrangements: Alone Available Help at Discharge: Family;Available PRN/intermittently Type of Home: Apartment Home Access: Stairs to enter Entrance Stairs-Number of Steps: 1 (4" step)   Home Layout: One level     Bathroom Shower/Tub: Walk-in Hydrologist: Standard     Home Equipment: Cane - single point;Grab bars - tub/shower;Grab bars - toilet;Shower seat;Hand held Tourist information centre manager - 4 wheels;Adaptive equipment Adaptive Equipment: Long-handled sponge;Reacher Additional Comments: using RW since last hospital admission/LE cellulitis      Prior Functioning/Environment Level of Independence: Independent with assistive device(s)        Comments: independent with WC in home, reports condo in independent living facility. has assist with cleaning. reports she is still driving and managing medications.  Able to bathe and dress herself from sit/stand level.        OT Problem List: Impaired balance (sitting and/or standing);Decreased activity tolerance;Decreased strength;Decreased safety awareness;Decreased knowledge of use of DME or AE      OT Treatment/Interventions: Self-care/ADL training;Therapeutic exercise;Energy conservation;DME and/or AE instruction;Balance training;Therapeutic activities    OT Goals(Current goals can be found in the care plan section) Acute Rehab OT Goals Patient Stated Goal: I want to go home. OT Goal Formulation: With patient Time For Goal Achievement: 06/25/20 Potential to Achieve Goals: Fair ADL Goals Pt Will  Perform Grooming: sitting;standing;with set-up Pt Will Perform Lower Body Bathing: with supervision;sit to/from stand Pt Will Perform Lower Body Dressing: with supervision;sit to/from stand Pt Will Transfer to Toilet: with supervision;ambulating;regular height toilet Pt Will Perform Toileting - Clothing Manipulation and hygiene: with supervision;sit to/from stand  OT Frequency: Min 2X/week   Barriers to D/C:    none noted       Co-evaluation PT/OT/SLP Co-Evaluation/Treatment: Yes Reason for Co-Treatment: Complexity of the patient's impairments (multi-system involvement);For patient/therapist safety   OT goals addressed during session: ADL's and self-care      AM-PAC OT "6 Clicks" Daily Activity     Outcome Measure Help from another person eating meals?: None Help from another person taking care of personal grooming?: A Little Help from another person toileting, which includes using toliet, bedpan, or urinal?: A Lot Help from another person bathing (including washing, rinsing, drying)?: A Lot Help from another person to put on and taking off regular upper body clothing?: A Little Help from another person to put on and taking off regular lower body clothing?: A Lot 6 Click Score: 16   End of Session Equipment  Utilized During Treatment: Rolling walker;Oxygen Nurse Communication: Mobility status  Activity Tolerance: Patient tolerated treatment well Patient left: in chair;with call bell/phone within reach;with nursing/sitter in room  OT Visit Diagnosis: Unsteadiness on feet (R26.81);Muscle weakness (generalized) (M62.81);History of falling (Z91.81)                Time: [id number] OT Time Calculation (min): 25 min Charges:  OT General Charges $OT Visit: 1 Visit OT Evaluation $OT Eval Moderate Complexity: 1 Mod  06/11/2020  Rich, OTR/L  Acute Rehabilitation Services  Office:  [phone]   Metta Clines 06/11/2020, 5:18 PM

## 2020-06-11 NOTE — Progress Notes (Signed)
Unable to complete the H&P due to her not being sure of a couple of questions that pertain to her medical advance directives. Will pass on to day shift to ask her son.

## 2020-06-11 NOTE — Progress Notes (Signed)
PROGRESS NOTE  Shelly Obrien EUM:[id number] DOB: June 11, 1922 DOA: 07-10-20 PCP: [name], MD  HPI/Recap of past 24 hours:  Shelly Obrien is a 87 y.o. female with medical history significant of HTN, HLD, CAD S/P PCI in 74', A. fib on Eliquis, systolic CHF, last EF 54-00%, and CKD stage III resident with complaints of chest pain waking her out of her sleep this morning.  At baseline patient currently lives alone and gets around with use of a walker and a wheelchair.  Chest pain was described as pressure nature with some radiation to her back.  Associated symptoms included shortness of breath which has worsened over the last week, nonproductive cough, fatigue, lower extremity swelling, and weight loss with a few pounds. Denies having any change in appetite, nausea, vomiting, abdominal pain, diarrhea, dysuria, loss of consciousness, or recent falls.  Patient reports that she has been taking all of her medications as prescribed and has not missed any doses.  Sitting up this morning helped a little bit with her shortness of breath symptoms  She had suffered a right femur fracture back in August 2021 treated with intramedullary fixation.  She spent about 30 days in rehab prior to going back home.  Since being home she reports that she had noticed some shortness of breath with exertion.  She usually uses B wheelchair while in the kitchen, but if she has to go across the carpet in the living room with her wheelchair it can make her very short of breath to the point that she has to stop and rest.  Normally patient uses her walker on the carpet.  She has home health care nurse that comes and checks on her.  In route with EMS patient was given 1 sublingual nitroglycerin and 324 mg of aspirin.  EMS reported that blood pressure dropped 40 points with nitroglycerin from the 150's/90's down to the 110's/60's.  Patient was initially called in as a code STEMI, but was canceled by cardiology.  ED Course: .  Upon  admission into the emergency department patient was seen to be afebrile, pulse 40-109, O2 saturations 86-98% on 2 L of [place], and all other vital signs maintained.  EKG revealed signs of ST elevation in V4-5.  Cardiology have been consulted to evaluated the patient and code STEMI was recalled.  Labs significant for hemoglobin 11.5, BUN 27, creatinine 2.52, hemoglobin A1c 6, LDL58, high-sensitivity troponin 12,241-> 12,017.  Influenza and COVID-19 screening were negative.  Chest x-ray revealed diffuse interstitial pulmonary infiltrate and bilateral pleural effusions concerning for heart failure.  Cardiology recommended medical management given age, acute kidney injury, and other comorbidities.  06/11/20: Patient was seen and examined at her bedside.  She denies any chest pain at the time of this visit.  She reports being short winded with minimal movement.  She was seen by cardiology and the plan is for conservative management.  Assessment/Plan: Principal Problem:   Acute MI ([place]) Active Problems:   CAD (coronary artery disease)   Longstanding persistent atrial fibrillation (HCC)   Acute kidney injury superimposed on chronic kidney disease ([place])   DNR (do not resuscitate)   Prediabetes   Normocytic anemia   Acute on chronic systolic CHF (congestive heart failure) ([place])   Acute congestive heart failure ([place])  Acute NSTEMI with abnormal EKG She presented with chest pressure with radiation to her back. Twelve-lead EKG abnormal Elevated troponin greater than 12,000. Received full dose aspirin and nitroglycerin in route She is currently on heparin  drip for 48 hours, aspirin 81 mg daily, Toprol-XL 25 mg twice daily She has been intolerant of statin. She was seen by cardiology 2D echo done on 06/24/2020 shows LVEF less than 20%, left ventricle severely decreased function with global hypokinesis.  Right atrial size moderately dilated.  Acute on chronic systolic CHF Presented with elevated BNP greater  than 2400. Pulmonary edema and bilateral pleural effusions on chest x-ray, personally reviewed 2D echo with findings as stated above Diuretics has been held due to bump in creatinine. Strict I's and O's and daily weight Continue cardiac medications as recommended by cardiology.  Acute hypoxic [place] failure secondary to pulmonary edema and bilateral pleural effusion likely cardiogenic in the setting of acute on chronic systolic CHF She is currently requiring 2 to 3 L to maintain O2 saturation greater than 90% She was initially on diuretics, held by cardiology due to bump in creatinine Start incentive spirometer and flutter valve, if no improvement may consider right-sided thoracentesis.  Bilateral pleural effusions, right greater than left, likely cardiogenic Management per above  AKI on CKD 3B Baseline creatinine appears to be 1.4 with GFR of 32 Presented with creatinine of 2.5 with GFR of 17 Creatinine is uptrending, 2.74 with GFR 15 Diuretics on hold Monitor urine output and avoid nephrotoxins Avoid hypotension Daily renal panel  Chronic A. Fib Prior to admission she was on amiodarone and Eliquis Eliquis is currently on hold while on heparin drip Plan is to continue heparin drip for 48 hours. Continue home amiodarone Toprol-XL 25 mg twice daily has been added by cardiology. Follow blood pressure and heart rate.  Impaired glucose tolerance Hemoglobin A1c 6.0 on 06/25/2020 Monitor for now Start insulin sliding scale if concern for hyperglycemia.  Physical debility-ambulatory dysfunction PT OT to assess Fall precautions Family is concerned about her returning home  Code Status: DNR  Family Communication: None at bedside  Disposition Plan: Likely will DC to SNF   Consultants:  Cardiology  Procedures:  2D echo  Antimicrobials:  None  DVT prophylaxis: Heparin drip  Status is: Inpatient    Dispo:  Patient From: Home  Planned Disposition: Home with  Health Care Svc  Expected discharge date: 07-08-20  Medically stable for discharge: No, ongoing management of NSTEMI, acute on chronic systolic CHF, acute hypoxic respiratory failure, pulmonary edema/bilateral pleural effusions.         Objective: Vitals:   06/18/2020 2308 06/11/20 0150 06/11/20 0816 06/11/20 1114  BP: 114/87 118/88 (!) 116/94 106/80  Pulse: (!) 101 (!) 52 86 93  Resp: 16 19 17 16   Temp: 98.1 F (36.7 C) 98.5 F (36.9 C) (!) 97 F (36.1 C) (!) 96.9 F (36.1 C)  TempSrc: Oral Oral Axillary Axillary  SpO2: 100% 96% 98% 93%  Weight:      Height:        Intake/Output Summary (Last 24 hours) at 06/11/2020 1357 Last data filed at 06/11/2020 0858 Gross per 24 hour  Intake 987.3 ml  Output -  Net 987.3 ml   Filed Weights   06/21/2020 0326 07/01/2020 2224  Weight: 61.2 kg 70 kg    Exam:  . General: 87 y.o. year-old female well developed well nourished in no acute distress.  Alert and interactive. . Cardiovascular: Irregular rate and rhythm with no rubs or gallops.  No thyromegaly or JVD noted.   [name] Respiratory: Diffuse rales bilaterally.  No wheezing noted.  Poor inspiratory effort.   . Abdomen: Soft nontender nondistended with normal bowel sounds x4 quadrants. [name]  Musculoskeletal: Trace lower extremity edema bilaterally.   . Skin: No ulcerative lesions noted or rashes, . Psychiatry: Mood is appropriate for condition and setting   Data Reviewed: CBC: Recent Labs  Lab 06/16/2020 0329 06/23/2020 0554 06/11/20 0230  WBC 8.8  --  11.7*  NEUTROABS  --   --  8.7*  HGB 11.5* 13.3 11.3*  HCT 38.0 39.0 37.4  MCV 86.0  --  85.4  PLT 275  --  [id number]   Basic Metabolic Panel: Recent Labs  Lab 06/20/2020 0329 07/04/2020 0554 06/11/20 0230  NA 143 143 142  K 5.1 5.1 5.1  CL 106 106 103  CO2 25  --  27  GLUCOSE 124* 105* 123*  BUN 27* 40* 34*  CREATININE 2.52* 2.40* 2.74*  CALCIUM 9.7  --  9.5  MG  --   --  2.3   GFR: Estimated Creatinine Clearance: 12.1 mL/min (A)  (by C-G formula based on SCr of 2.74 mg/dL (H)). Liver Function Tests: Recent Labs  Lab 06/09/2020 0329  AST 83*  ALT 31  ALKPHOS 75  BILITOT 1.4*  PROT 6.6  ALBUMIN 3.3*   No results for input(s): LIPASE, AMYLASE in the last 168 hours. No results for input(s): AMMONIA in the last 168 hours. Coagulation Profile: Recent Labs  Lab 07/03/2020 0329  INR 1.8*   Cardiac Enzymes: No results for input(s): CKTOTAL, CKMB, CKMBINDEX, TROPONINI in the last 168 hours. BNP (last 3 results) No results for input(s): PROBNP in the last 8760 hours. HbA1C: Recent Labs    06/20/2020 0329  HGBA1C 6.0*   CBG: No results for input(s): GLUCAP in the last 168 hours. Lipid Profile: Recent Labs    06/27/2020 0329  CHOL 144  HDL 72  LDLCALC 58  TRIG 68  CHOLHDL 2.0   Thyroid Function Tests: No results for input(s): TSH, T4TOTAL, FREET4, T3FREE, THYROIDAB in the last 72 hours. Anemia Panel: No results for input(s): VITAMINB12, FOLATE, FERRITIN, TIBC, IRON, RETICCTPCT in the last 72 hours. Urine analysis:    Component Value Date/Time   COLORURINE YELLOW 12/14/2017 [place] 12/14/2017 0757   LABSPEC 1.020 04/25/2018 0925   PHURINE 6.5 04/25/2018 0925   GLUCOSEU NEGATIVE 04/25/2018 0925   HGBUR NEGATIVE 04/25/2018 0925   BILIRUBINUR NEGATIVE 04/25/2018 0925   KETONESUR NEGATIVE 04/25/2018 0925   PROTEINUR 30 (A) 04/25/2018 0925   UROBILINOGEN 0.2 04/25/2018 0925   NITRITE NEGATIVE 04/25/2018 0925   LEUKOCYTESUR TRACE (A) 04/25/2018 0925   Sepsis Labs: @LABRCNTIP (procalcitonin:4,lacticidven:4)  ) Recent Results (from the past 240 hour(s))  Resp Panel by RT-PCR (Flu A&B, Covid) Nasopharyngeal Swab     Status: None   Collection Time: 06/30/2020  3:29 AM   Specimen: Nasopharyngeal Swab; Nasopharyngeal(NP) swabs in vial transport medium  Result Value Ref Range Status   SARS Coronavirus 2 by RT PCR NEGATIVE NEGATIVE Final    Comment: (NOTE) SARS-CoV-2 target nucleic acids  are NOT DETECTED.  The SARS-CoV-2 RNA is generally detectable in upper respiratory specimens during the acute phase of infection. The lowest concentration of SARS-CoV-2 viral copies this assay can detect is 138 copies/mL. A negative result does not preclude SARS-Cov-2 infection and should not be used as the sole basis for treatment or other patient management decisions. A negative result may occur with  improper specimen collection/handling, submission of specimen other than nasopharyngeal swab, presence of viral mutation(s) within the areas targeted by this assay, and inadequate number of viral copies(<138 copies/mL). A negative result must be  combined with clinical observations, patient history, and epidemiological information. The expected result is Negative.  Fact Sheet for Patients:  EntrepreneurPulse.com.au  Fact Sheet for Healthcare Providers:  IncredibleEmployment.be  This test is no t yet approved or cleared by the [place] FDA and  has been authorized for detection and/or diagnosis of SARS-CoV-2 by FDA under an Emergency Use Authorization (EUA). This EUA will remain  in effect (meaning this test can be used) for the duration of the COVID-19 declaration under Section 564(b)(1) of the Act, 21 U.S.C.section 360bbb-3(b)(1), unless the authorization is terminated  or revoked sooner.       Influenza A by PCR NEGATIVE NEGATIVE Final   Influenza B by PCR NEGATIVE NEGATIVE Final    Comment: (NOTE) The Xpert Xpress SARS-CoV-2/FLU/RSV plus assay is intended as an aid in the diagnosis of influenza from Nasopharyngeal swab specimens and should not be used as a sole basis for treatment. Nasal washings and aspirates are unacceptable for Xpert Xpress SARS-CoV-2/FLU/RSV testing.  Fact Sheet for Patients: EntrepreneurPulse.com.au  Fact Sheet for Healthcare Providers: IncredibleEmployment.be  This test is  not yet approved or cleared by the [place] FDA and has been authorized for detection and/or diagnosis of SARS-CoV-2 by FDA under an Emergency Use Authorization (EUA). This EUA will remain in effect (meaning this test can be used) for the duration of the COVID-19 declaration under Section 564(b)(1) of the Act, 21 U.S.C. section 360bbb-3(b)(1), unless the authorization is terminated or revoked.  Performed at [facility], [place] [address], [place], [place] [address]       Studies: ECHOCARDIOGRAM COMPLETE  Result Date: 06/15/2020    ECHOCARDIOGRAM REPORT   Patient Name:   WILLEEN WICKER Date of Exam: 06/27/2020 Medical Rec #:  [id number]     Height:       65.0 in Accession #:    [id number]    Weight:       135.0 lb Date of Birth:  04-Mar-1923     BSA:          1.674 m Patient Age:    89 years      BP:           111/92 mmHg Patient Gender: F             HR:           127 bpm. Exam Location:  Inpatient Procedure: 2D Echo, Cardiac Doppler, Color Doppler and Intracardiac            Opacification Agent Indications:    STEMI I21.3  History:        Patient has prior history of Echocardiogram examinations, most                 recent 07/13/2019. Previous Myocardial Infarction and CAD,                 Arrythmias:Atrial Fibrillation; Risk Factors:Hypertension,                 Dyslipidemia and Former Smoker.  Sonographer:    Vickie Epley RDCS Referring Phys: [id number] RONDELL A SMITH IMPRESSIONS  1. Left ventricular ejection fraction, by estimation, is <20%. The left ventricle has severely decreased function. The left ventricle demonstrates global hypokinesis. The left ventricular internal cavity size was mildly dilated. Left ventricular diastolic parameters are indeterminate.  2. Right ventricular systolic function is moderately reduced. The right ventricular size is mildly enlarged. There is moderately elevated pulmonary artery systolic pressure. The estimated right ventricular  systolic pressure is 67.3 mmHg.   3. Left atrial size was mildly dilated.  4. Right atrial size was moderately dilated.  5. The mitral valve is normal in structure. Trivial mitral valve regurgitation. No evidence of mitral stenosis.  6. The aortic valve is tricuspid. Aortic valve regurgitation is trivial. Mild to moderate aortic valve sclerosis/calcification is present, without any evidence of aortic stenosis.  7. The inferior vena cava is dilated in size with <50% respiratory variability, suggesting right atrial pressure of 15 mmHg. FINDINGS  Left Ventricle: Left ventricular ejection fraction, by estimation, is <20%. The left ventricle has severely decreased function. The left ventricle demonstrates global hypokinesis. Definity contrast agent was given IV to delineate the left ventricular endocardial borders. The left ventricular internal cavity size was mildly dilated. There is no left ventricular hypertrophy. Left ventricular diastolic parameters are indeterminate. Right Ventricle: The right ventricular size is mildly enlarged. No increase in right ventricular wall thickness. Right ventricular systolic function is moderately reduced. There is moderately elevated pulmonary artery systolic pressure. The tricuspid regurgitant velocity is 2.92 m/s, and with an assumed right atrial pressure of 15 mmHg, the estimated right ventricular systolic pressure is 41.9 mmHg. Left Atrium: Left atrial size was mildly dilated. Right Atrium: Right atrial size was moderately dilated. Pericardium: There is no evidence of pericardial effusion. Mitral Valve: The mitral valve is normal in structure. Trivial mitral valve regurgitation. No evidence of mitral valve stenosis. Tricuspid Valve: The tricuspid valve is normal in structure. Tricuspid valve regurgitation is mild. Aortic Valve: The aortic valve is tricuspid. Aortic valve regurgitation is trivial. Mild to moderate aortic valve sclerosis/calcification is present, without any evidence of aortic stenosis. Pulmonic  Valve: The pulmonic valve was normal in structure. Pulmonic valve regurgitation is not visualized. Aorta: The aortic root is normal in size and structure. Venous: The inferior vena cava is dilated in size with less than 50% respiratory variability, suggesting right atrial pressure of 15 mmHg. IAS/Shunts: No atrial level shunt detected by color flow Doppler.  LEFT VENTRICLE PLAX 2D LVOT diam:     2.30 cm LV SV:         23 LV SV Index:   14 LVOT Area:     4.15 cm  RIGHT VENTRICLE TAPSE (M-mode): 0.7 cm LEFT ATRIUM             Index       RIGHT ATRIUM           Index LA Vol (A2C):   58.1 ml 34.71 ml/m RA Area:     26.80 cm LA Vol (A4C):   64.6 ml 38.59 ml/m RA Volume:   102.00 ml 60.94 ml/m LA Biplane Vol: 65.3 ml 39.01 ml/m  AORTIC VALVE LVOT Vmax:   45.00 cm/s LVOT Vmean:  30.600 cm/s LVOT VTI:    0.055 m  AORTA Ao Root diam: 3.20 cm Ao Asc diam:  3.60 cm MR Peak grad: 24.0 mmHg   TRICUSPID VALVE MR Vmax:      245.00 cm/s TR Peak grad:   34.1 mmHg                           TR Vmax:        292.00 cm/s                            SHUNTS  Systemic VTI:  0.05 m                           Systemic Diam: 2.30 cm Loralie Champagne MD Electronically signed by Loralie Champagne MD Signature Date/Time: 06/21/2020/5:23:32 PM    Final     Scheduled Meds: . amiodarone  200 mg Oral BID  . aspirin EC  81 mg Oral Daily  . mouth rinse  15 mL Mouth Rinse BID  . metoprolol succinate  25 mg Oral BID WC  . sodium chloride flush  3 mL Intravenous Q12H    Continuous Infusions: . sodium chloride    . heparin 900 Units/hr (06/11/20 1316)     LOS: 1 day     Kayleen Memos, MD [facility] Pager [phone]  If 7PM-7AM, please contact night-coverage [email or web address] Password [facility] 06/11/2020, 1:57 PM

## 2020-06-11 NOTE — Progress Notes (Addendum)
[place] for heparin Indication: chest pain/ACS  Allergies  Allergen Reactions  . Antihistamines, Chlorpheniramine-Type Other (See Comments)    Pt states that she passed out.   [name] Penicillins Anaphylaxis and Other (See Comments)    Has patient had a PCN reaction causing immediate rash, facial/tongue/throat swelling, SOB or lightheadedness with hypotension: Yes Has patient had a PCN reaction causing severe rash involving mucus membranes or skin necrosis: No Has patient had a PCN reaction that required hospitalization No Has patient had a PCN reaction occurring within the last 10 years: No If all of the above answers are "NO", then may proceed with Cephalosporin use.  . Contrast Media [Iodinated Diagnostic Agents] Hives  . Sulfonamide Derivatives Other (See Comments)    Reaction:  Unknown   . Atorvastatin Other (See Comments)    myalgias  . Pheniramine Other (See Comments)    Pt states that she passed out.   . Sulfa Antibiotics Rash    Patient Measurements: Height: 5\' 5"  (165.1 cm) Weight: 70 kg (154 lb 5.2 oz) IBW/kg (Calculated) : 57 Heparin Dosing Weight: 61.2 kg   Vital Signs: Temp: 98.5 F (36.9 C) (01/08 0150) Temp Source: Oral (01/08 0150) BP: 118/88 (01/08 0150) Pulse Rate: 52 (01/08 0150)  Labs: Recent Labs    2020-06-26 0329 06-26-20 0535 06/26/2020 0554 06/26/20 1717 06/11/20 0230  HGB 11.5*  --  13.3  --  11.3*  HCT 38.0  --  39.0  --  37.4  PLT 275  --   --   --  244  APTT 32  --   --  52* 75*  LABPROT 20.5*  --   --   --   --   INR 1.8*  --   --   --   --   HEPARINUNFRC  --   --   --   --  >2.20*  CREATININE 2.52*  --  2.40*  --  2.74*  TROPONINIHS 12,241* 12,017*  --   --   --     Estimated Creatinine Clearance: 12.1 mL/min (A) (by C-G formula based on SCr of 2.74 mg/dL (H)).   Medical History: Past Medical History:  Diagnosis Date  . Aortic ectasia ([place])   . Arm fracture 2013   right, Dr. Sharen Heck  . B12  deficiency   . CAD S/P percutaneous coronary angioplasty    hx MI 04/ 1994  s/p  PCI to LAD  . CKD (chronic kidney disease), stage III ([place])   . Complication of anesthesia    hard to wake   . Gallstones   . History of basal cell carcinoma (BCC) excision    right eye area 08/ 2013  . History of kidney stones 01/20/2017  . History of MI (myocardial infarction) 09/1992   s/p  PCI to LAD  . History of squamous cell carcinoma excision    right lower leg 2015 ;  2016  . Hx of colonic polyps   . Hyperlipidemia   . Hypertension   . Macular degeneration   . Osteoporosis   . PAF (paroxysmal atrial fibrillation) ([place]) dx 02-24-2016   followed by cardiologist-- dr Mamie Nick. [name]  . Pernicious anemia   . PONV (postoperative nausea and vomiting)   . Rib fracture 07/2018   left  . Right ureteral stone   . Wears glasses     Medications:  Medications Prior to Admission  Medication Sig Dispense Refill Last Dose  . amiodarone (PACERONE) 200 MG  tablet Take 200 mg by mouth 2 (two) times daily.   06/09/2020 at Unknown time  . apixaban (ELIQUIS) 5 MG TABS tablet Take 5 mg by mouth 2 (two) times daily.   06/09/2020 at 1600  . furosemide (LASIX) 20 MG tablet Take 20 mg by mouth daily.   06/09/2020 at Unknown time  . metoprolol tartrate (LOPRESSOR) 50 MG tablet Take 50 mg by mouth daily.   06/09/2020 at 0800  . Multiple Vitamins-Minerals (ICAPS) CAPS Take 1 capsule by mouth 2 (two) times daily.    06/09/2020 at Unknown time  . Polyethyl Glycol-Propyl Glycol (SYSTANE) 0.4-0.3 % SOLN Place 1 drop into both eyes 2 (two) times daily as needed (dry eyes).   06/08/2020    Assessment: 89 YOF who presents with chest pain to start IV heparin for ACS/STEMI. Planning medical management given her advanced age. [name], patient is on apixaban at home and last dose was evening of 1/6.   aPTT now therapeutic (x1) at 72 after increasing rate to 900 units/hr. Hgb dropped 13.3 > 11.3, no bleeding noted. Plt wnl. Will continue to  monitor aPTT until correlates with heparin level. Per cards note, plan to continue IV heparin likely total of 48 hrs.   Goal of Therapy:  Heparin level 0.3-0.7 units/ml aPTT 66-102 seconds Monitor platelets by anticoagulation protocol: Yes   Plan:  Continue heparin gtt at 900 units/hr F/u 8 hour confirmatory aPTT  Daily aPTT/HL, CBC, s/s bleeding Monitor cards plan, LOT, ability to transition back to [facility], PharmD PGY1 Pharmacy Resident 06/11/2020 7:22 AM  Please check [email or web address] for unit-specific pharmacy phone numbers.  Addendum 06/11/20 1155: Confirmatory aPTT remains therapeutic (85 seconds). Continue heparin gtt at 900 units/hr. Daily aPTT/heparin level with AM labs.

## 2020-06-11 NOTE — Evaluation (Signed)
Physical Therapy Evaluation Patient Details Name: Shelly Obrien MRN: [id number] DOB: 08-22-1922 Today's Date: 06/11/2020   History of Present Illness  87 y.o. female with medical history significant of HTN, HLD, CAD S/P PCI in 33', A. fib on Eliquis, systolic CHF, last EF 36-64%, and CKD stage III resident with complaints of chest pain.  Associated symptoms included shortness of breath which has worsened over the last week, nonproductive cough, fatigue, lower extremity swelling, and weight loss with a few pounds.  She had suffered a right femur fracture back in August 2021 treated with intramedullary fixation.    Clinical Impression  Pt in bed upon arrival of PT, agreeable to evaluation at this time. Prior to admission the pt was mobilizing with use of RW or WC in her home, receiving HHPT and care from [facility] RN. The pt now presents with limitations in functional mobility, strength, power, stability, and activity tolerance due to above dx, and will continue to benefit from skilled PT to address these deficits. The pt was able to complete multiple sit-stand transfers and stand-pivot transfer with use of RW and modA of 1-2 at this time, but required modA of 2 to maintain balance and upright position with gait.   The pt reports she would have 24/7 assist from son at home and would prefer d/c home. I feel with 24/7 supervision/assist and initial mobility limited to transfers to [facility] in the home, the pt could d/c home with HHPT and family assist. If 24/7 supervision/assist cannot be arranged, the pt is not safe to return home alone and would need SNF to further rehab deficits.      Follow Up Recommendations Supervision/Assistance - 24 hour;Home health PT (if 24/7 cannot be arranged, then SNF)    Equipment Recommendations  None recommended by PT    Recommendations for Other Services       Precautions / Restrictions Precautions Precautions: Fall Restrictions Weight Bearing Restrictions: No Other  Position/Activity Restrictions: O2 sats and HR      Mobility  Bed Mobility Overal bed mobility: Needs Assistance Bed Mobility: Supine to Sit     Supine to sit: Min guard     General bed mobility comments: moves quickly but with increased effort and use of bed rail    Transfers Overall transfer level: Needs assistance Equipment used: Rolling walker (2 wheeled) Transfers: Sit to/from [facility] Sit to Stand: Min assist Stand pivot transfers: Mod assist;+2 physical assistance       General transfer comment: minA to power up, incrased time and cues to reach for RW handle. posterior lean initially  Ambulation/Gait Ambulation/Gait assistance: Mod assist;+2 physical assistance Gait Distance (Feet): 12 Feet Assistive device: Rolling walker (2 wheeled) Gait Pattern/deviations: Step-to pattern;Decreased stride length;Shuffle;Trunk flexed Gait velocity: decreased Gait velocity interpretation: <1.31 ft/sec, indicative of household ambulator General Gait Details: short, choppy steps requiring modA of 2 to maintain balance due to rapid onset pt fatigue.      Balance Overall balance assessment: Needs assistance Sitting-balance support: No upper extremity supported Sitting balance-Leahy Scale: Fair     Standing balance support: Bilateral upper extremity supported Standing balance-Leahy Scale: Poor Standing balance comment: relies on RW and external assist                             Pertinent Vitals/Pain Pain Assessment: No/denies pain    Home Living Family/patient expects to be discharged to:: Private residence Living Arrangements: Alone Available Help at  Discharge: Family;Available PRN/intermittently Type of Home: Apartment (condo) Home Access: Stairs to enter   [employer] of Steps: 1 (4" step) Home Layout: One level Home Equipment: Cane - single point;Grab bars - tub/shower;Grab bars - toilet;Shower seat;Hand held Corporate investment banker - 4 wheels;Adaptive equipment Additional Comments: using RW since last hospital admission/LE cellulitis    Prior Function Level of Independence: Independent with assistive device(s)         Comments: independent with WC in home, reports condo in independent living facility. has assist with cleaning. reports she is still driving and managing medications.  Able to bathe and dress herself from sit/stand level.     Hand Dominance   Dominant Hand: Right    Extremity/Trunk Assessment   Upper Extremity Assessment Upper Extremity Assessment: Defer to OT evaluation LUE Deficits / Details: decreased end range for shoulder flexion - ? RCT LUE Sensation: WNL LUE Coordination: WNL    Lower Extremity Assessment Lower Extremity Assessment: Generalized weakness;RLE deficits/detail RLE Deficits / Details: decreased wt acceptance RLE (rt hip fx in august)    Cervical / Trunk Assessment Cervical / Trunk Assessment: Kyphotic  Communication   Communication: No difficulties  Cognition Arousal/Alertness: Awake/alert Behavior During Therapy: WFL for tasks assessed/performed Overall Cognitive Status: No family/caregiver present to determine baseline cognitive functioning                                 General Comments: patient following commands well, difficulty with complex thought, but HOH may be playing a part in that.  Will continue to assess and monitor.      General Comments General comments (skin integrity, edema, etc.): HR elevated to 120 bpm, other VSS on 3L o2    Exercises     Assessment/Plan    PT Assessment Patient needs continued PT services  PT Problem List Decreased strength;Decreased range of motion;Decreased activity tolerance;Decreased balance;Decreased mobility;Decreased coordination;Decreased cognition;Decreased knowledge of use of DME;Pain       PT Treatment Interventions DME instruction;Gait training;Stair training;Functional mobility  training;Therapeutic activities;Therapeutic exercise;Balance training;Patient/family education    PT Goals (Current goals can be found in the Care Plan section)  Acute Rehab PT Goals Patient Stated Goal: I want to go home. PT Goal Formulation: With patient Time For Goal Achievement: 06/25/20 Potential to Achieve Goals: Fair    Frequency Min 3X/week   Barriers to discharge Decreased caregiver support      Co-evaluation PT/OT/SLP Co-Evaluation/Treatment: Yes Reason for Co-Treatment: Necessary to address cognition/behavior during functional activity;To address functional/ADL transfers;For patient/therapist safety PT goals addressed during session: Mobility/safety with mobility;Balance;Proper use of DME OT goals addressed during session: ADL's and self-care       AM-PAC PT "6 Clicks" Mobility  Outcome Measure Help needed turning from your back to your side while in a flat bed without using bedrails?: A Little Help needed moving from lying on your back to sitting on the side of a flat bed without using bedrails?: A Little Help needed moving to and from a bed to a chair (including a wheelchair)?: A Little Help needed standing up from a chair using your arms (e.g., wheelchair or bedside chair)?: A Lot Help needed to walk in hospital room?: A Lot Help needed climbing 3-5 steps with a railing? : Total 6 Click Score: 14    End of Session Equipment Utilized During Treatment: Gait belt;Oxygen Activity Tolerance: Patient tolerated treatment well Patient left: in chair;with call bell/phone within reach;with  chair alarm set Nurse Communication: Mobility status PT Visit Diagnosis: Other abnormalities of gait and mobility (R26.89);Muscle weakness (generalized) (M62.81);Unsteadiness on feet (R26.81)    Time: 9826-4158 PT Time Calculation (min) (ACUTE ONLY): 30 min   Charges:   PT Evaluation $PT Eval Moderate Complexity: 1 Mod          Karma Ganja, PT, DPT   Acute Rehabilitation  Department Pager #: [phone]  Otho Bellows 06/11/2020, 5:36 PM

## 2020-06-12 ENCOUNTER — Inpatient Hospital Stay (HOSPITAL_COMMUNITY): Payer: Medicare Other

## 2020-06-12 DIAGNOSIS — I5023 Acute on chronic systolic (congestive) heart failure: Secondary | ICD-10-CM | POA: Diagnosis not present

## 2020-06-12 DIAGNOSIS — I5021 Acute systolic (congestive) heart failure: Secondary | ICD-10-CM

## 2020-06-12 DIAGNOSIS — N179 Acute kidney failure, unspecified: Secondary | ICD-10-CM | POA: Diagnosis not present

## 2020-06-12 DIAGNOSIS — I213 ST elevation (STEMI) myocardial infarction of unspecified site: Secondary | ICD-10-CM | POA: Diagnosis not present

## 2020-06-12 DIAGNOSIS — J9601 Acute respiratory failure with hypoxia: Secondary | ICD-10-CM | POA: Diagnosis not present

## 2020-06-12 LAB — CBC
HCT: 38 % (ref 36.0–46.0)
Hemoglobin: 11.3 g/dL — ABNORMAL LOW (ref 12.0–15.0)
MCH: 25.9 pg — ABNORMAL LOW (ref 26.0–34.0)
MCHC: 29.7 g/dL — ABNORMAL LOW (ref 30.0–36.0)
MCV: 87 fL (ref 80.0–100.0)
Platelets: 232 10*3/uL (ref 150–400)
RBC: 4.37 MIL/uL (ref 3.87–5.11)
RDW: 20.3 % — ABNORMAL HIGH (ref 11.5–15.5)
WBC: 10.6 10*3/uL — ABNORMAL HIGH (ref 4.0–10.5)
nRBC: 0.6 % — ABNORMAL HIGH (ref 0.0–0.2)

## 2020-06-12 LAB — BASIC METABOLIC PANEL
Anion gap: 10 (ref 5–15)
Anion gap: 10 (ref 5–15)
BUN: 43 mg/dL — ABNORMAL HIGH (ref 8–23)
BUN: 48 mg/dL — ABNORMAL HIGH (ref 8–23)
CO2: 26 mmol/L (ref 22–32)
CO2: 26 mmol/L (ref 22–32)
Calcium: 9.4 mg/dL (ref 8.9–10.3)
Calcium: 9.3 mg/dL (ref 8.9–10.3)
Chloride: 105 mmol/L (ref 98–111)
Chloride: 105 mmol/L (ref 98–111)
Creatinine, Ser: 2.65 mg/dL — ABNORMAL HIGH (ref 0.44–1.00)
Creatinine, Ser: 2.65 mg/dL — ABNORMAL HIGH (ref 0.44–1.00)
GFR, Estimated: 16 mL/min — ABNORMAL LOW (ref >60)
GFR, Estimated: 16 mL/min — ABNORMAL LOW (ref >60)
Glucose, Bld: 108 mg/dL — ABNORMAL HIGH (ref 70–99)
Glucose, Bld: 131 mg/dL — ABNORMAL HIGH (ref 70–99)
Potassium: 5.4 mmol/L — ABNORMAL HIGH (ref 3.5–5.1)
Potassium: 5.3 mmol/L — ABNORMAL HIGH (ref 3.5–5.1)
Sodium: 141 mmol/L (ref 135–145)
Sodium: 141 mmol/L (ref 135–145)

## 2020-06-12 LAB — BODY FLUID CELL COUNT WITH DIFFERENTIAL
Lymphs, Fluid: 34 %
Monocyte-Macrophage-Serous Fluid: 47 % — ABNORMAL LOW (ref 50–90)
Neutrophil Count, Fluid: 19 % (ref 0–25)
Total Nucleated Cell Count, Fluid: 125 cu mm (ref 0–1000)

## 2020-06-12 LAB — BLOOD GAS, VENOUS
Acid-Base Excess: 0.8 mmol/L (ref 0.0–2.0)
Bicarbonate: 27.2 mmol/L (ref 20.0–28.0)
Drawn by: 5781
FIO2: 30
O2 Saturation: 76 %
Patient temperature: 37
pCO2, Ven: 63.7 mmHg — ABNORMAL HIGH (ref 44.0–60.0)
pH, Ven: 7.254 (ref 7.250–7.430)

## 2020-06-12 LAB — HEPARIN LEVEL (UNFRACTIONATED): Heparin Unfractionated: 1.6 IU/mL — ABNORMAL HIGH (ref 0.30–0.70)

## 2020-06-12 LAB — APTT: aPTT: 58 seconds — ABNORMAL HIGH (ref 24–36)

## 2020-06-12 LAB — ALBUMIN, PLEURAL OR PERITONEAL FLUID: Albumin, Fluid: 1.1 g/dL

## 2020-06-12 LAB — GLUCOSE, PLEURAL OR PERITONEAL FLUID: Glucose, Fluid: 117 mg/dL

## 2020-06-12 LAB — LACTATE DEHYDROGENASE, PLEURAL OR PERITONEAL FLUID: LD, Fluid: 111 U/L — ABNORMAL HIGH (ref 3–23)

## 2020-06-12 MED ORDER — LIDOCAINE HCL (PF) 1 % IJ SOLN
INTRAMUSCULAR | Status: AC
  Filled 2020-06-12: qty 30

## 2020-06-12 MED ORDER — FUROSEMIDE 10 MG/ML IJ SOLN
20.0000 mg | Freq: Two times a day (BID) | INTRAMUSCULAR | Status: DC
  Administered 2020-06-12: 20 mg via INTRAVENOUS
  Filled 2020-06-12: qty 2

## 2020-06-12 MED ORDER — APIXABAN 2.5 MG PO TABS
2.5000 mg | ORAL_TABLET | Freq: Two times a day (BID) | ORAL | Status: DC
Start: 2020-06-12 — End: 2020-06-14
  Administered 2020-06-12 – 2020-06-13 (×3): 2.5 mg via ORAL
  Filled 2020-06-12 (×3): qty 1

## 2020-06-12 MED ORDER — INSULIN ASPART 100 UNIT/ML ~~LOC~~ SOLN: 0.0000 [IU] | Freq: Three times a day (TID) | SUBCUTANEOUS | Status: DC

## 2020-06-12 NOTE — Progress Notes (Signed)
PROGRESS NOTE  Shelly Obrien ZTI:[id number] DOB: 1922/09/26 DOA: 06/21/2020 PCP: [name], MD  HPI/Recap of past 24 hours:  Shelly Obrien is a 87 y.o. female with medical history significant of HTN, HLD, right femur fracture repair in August 2021, CAD S/P PCI in 24',  chronic A. fib on Eliquis, systolic CHF 25 to 82%, and CKD stage III who presents with complaints of chest pain, pressure-like centrally with radiation to the back waking her out of her sleep the morning of her presentation.  At baseline her mentation is intact, she lives alone and gets around with the use of a cane and a wheelchair.    Associated symptoms included shortness of breath which has worsened over the last week, nonproductive cough, fatigue, lower extremity swelling.  She has been taking all of her medications as prescribed and has not missed any doses.  Sitting up this morning helped a little bit with her shortness of breath symptoms.  In route with EMS patient was given 1 sublingual nitroglycerin and 324 mg of aspirin.  EMS reported that blood pressure dropped 40 points with nitroglycerin from the 150's/90's down to the 110's/60's.  Patient was initially called in as a code STEMI, but was canceled by cardiology.  ED Course:  Labs significant for hemoglobin 11.5, BUN 27, creatinine 2.52, hemoglobin A1c 6, LDL58, high-sensitivity troponin 12,241-> 12,017.  Influenza and COVID-19 screening were negative.  Chest x-ray revealed diffuse interstitial pulmonary infiltrate and bilateral pleural effusions concerning for heart failure exacerbation.  Cardiology recommended medical management given age, acute kidney injury, and other comorbidities.  06/12/20: Patient was seen and examined at bed at bedside.  She is alert oriented x3.  She reports dyspnea with minimal movement.  She had an ABG that was done on 06/11/2020 which revealed acute hypoxic and hypercarbic respiratory failure with pH of 7.2 and PCO2 of 55.  VBG done on 06/12/2020  showed pH of 7.2 with PCO2 of 63.  Patient will be kept on BiPAP overnight.  N.p.o. while on BiPAP to avoid aspiration.   Assessment/Plan: Principal Problem:   Acute MI ([place]) Active Problems:   CAD (coronary artery disease)   Longstanding persistent atrial fibrillation (H[facility])   Acute kidney injury superimposed on chronic kidney disease ([place])   DNR (do not resuscitate)   Prediabetes   Normocytic anemia   Acute on chronic systolic CHF (congestive heart failure) ([place])   Acute congestive heart failure ([place])  Acute NSTEMI with abnormal EKG She presented with chest pressure with radiation to her back. Twelve-lead EKG abnormal Presented with elevated troponin greater than 12,000. Received full dose aspirin and nitroglycerin in route via EMS She received 48 hours of heparin drip.  She is currently on aspirin 81 mg daily, Toprol-XL 25 mg twice daily She has been intolerant of statin. Cardiology following 2D echo done on 06/24/2020 shows LVEF less than 20%, left ventricle severely decreased function with global hypokinesis.  Right atrial size moderately dilated.  Acute hypoxic and hypercarbic respiratory failure secondary to pulmonary edema and bilateral pleural effusions right greater than left. Not on oxygen supplementation at baseline ABG done on 06/11/2020 showed pH 7.282, PCO2 55 VBG done on 06/12/2020 shows pH 7.254, PCO2 63 Patient will be on BiPAP overnight  Pulmonary edema/bilateral pleural effusion right greater than left, likely cardiogenic post right thoracentesis on 06/12/2020 by IR. She has received a dose of Lasix due to significant acute hypoxic and hypercarbic respiratory failure and to improve her symptomatology. Cardiology has recommended  to hold diuretics She is currently post right thoracentesis with 1.0 L of yellow fluid removed.  Follow fluid analysis. Appreciate IR's assistance. Incentive spirometer, flutter valve as able. Mobilize as able.  Acute on chronic systolic  CHF Presented with elevated BNP greater than 2400. Pulmonary edema and bilateral pleural effusions on chest x-ray, personally reviewed 2D echo with findings as stated above Diuretics has been held by cardiology due to bump in creatinine. Continue strict I's and O's and daily weight.  AKI on CKD 3B Baseline creatinine appears to be 1.4 with GFR of 32 Presented with creatinine of 2.5 with GFR of 17 Creatinine is uptrending, 2.74 with GFR 15 Diuretics on hold Monitor urine output and avoid nephrotoxins Avoid hypotension Daily renal panel  Hyperkalemia in the setting of renal insufficiency Serum potassium 5.4 Received a dose of IV Lasix Repeat BMP this afternoon  Chronic A. Fib Prior to admission she was on amiodarone and Eliquis Eliquis was on hold while on heparin drip, completed 48 hours. Eliquis restarted on 06/12/2020 Continue home amiodarone Toprol-XL 25 mg twice daily has been added by cardiology. Continue to follow blood pressure and heart rate.  Impaired glucose tolerance Hemoglobin A1c 6.0 on 06/12/2020 Very sensitive insulin sliding scale  Physical debility-ambulatory dysfunction PT recommended SNF Continue fall precautions TOC consulted to assist with SNF placement.  Appreciate assistance.  Code Status: DNR  Family Communication: Updated her son via phone on 06/12/2020.  Disposition Plan: Likely will DC to SNF when cardiology signs of.   Consultants:  Cardiology  Procedures:  2D echo  Antimicrobials:  None  DVT prophylaxis: Eliquis.  Status is: Inpatient    Dispo:  Patient From: Home  Planned Disposition: Home with Health Care Svc  Expected discharge date: 06/04/2020  Medically stable for discharge: No, ongoing management of NSTEMI, acute on chronic systolic CHF, acute hypoxic respiratory failure, pulmonary edema/bilateral pleural effusions.         Objective: Vitals:   06/12/20 1255 06/12/20 1258 06/12/20 1305 06/12/20 1334  BP: 91/72  104/61 94/71 93/67   Pulse:    76  Resp:    20  Temp:      TempSrc:      SpO2:    99%  Weight:      Height:        Intake/Output Summary (Last 24 hours) at 06/12/2020 1541 Last data filed at 06/12/2020 0526 Gross per 24 hour  Intake 320 ml  Output 1100 ml  Net -780 ml   Filed Weights   06/12/20 0326 06-12-2020 2224 06/12/20 0425  Weight: 61.2 kg 70 kg 69.8 kg    Exam:  . General: 87 y.o. year-old female well-developed well-nourished dyspnea noted with movement.  Alert oriented x3.   . Cardiovascular: Irregular rate and rhythm no rubs or gallops. [name] Respiratory: Diffuse rales bilaterally.  No wheezing noted.  Poor inspiratory effort. . Abdomen: Soft nontender no bowel sounds present.  . Musculoskeletal: Trace lower extremity edema bilaterally. . Skin: No ulcerative lesions noted.   [name] Psychiatry: Mood is appropriate for condition and setting.   Data Reviewed: CBC: Recent Labs  Lab 06-12-2020 0329 2020-06-12 0554 06/11/20 0230 06/12/20 0223  WBC 8.8  --  11.7* 10.6*  NEUTROABS  --   --  8.7*  --   HGB 11.5* 13.3 11.3* 11.3*  HCT 38.0 39.0 37.4 38.0  MCV 86.0  --  85.4 87.0  PLT 275  --  244 761   Basic Metabolic Panel: Recent Labs  Lab 06-12-2020  [id number] 06/08/2020 0554 06/11/20 0230 06/12/20 0223  NA 143 143 142 141  K 5.1 5.1 5.1 5.4*  CL 106 106 103 105  CO2 25  --  27 26  GLUCOSE 124* 105* 123* 108*  BUN 27* 40* 34* 43*  CREATININE 2.52* 2.40* 2.74* 2.65*  CALCIUM 9.7  --  9.5 9.4  MG  --   --  2.3  --    GFR: Estimated Creatinine Clearance: 12.4 mL/min (A) (by C-G formula based on SCr of 2.65 mg/dL (H)). Liver Function Tests: Recent Labs  Lab 06/28/2020 0329  AST 83*  ALT 31  ALKPHOS 75  BILITOT 1.4*  PROT 6.6  ALBUMIN 3.3*   No results for input(s): LIPASE, AMYLASE in the last 168 hours. No results for input(s): AMMONIA in the last 168 hours. Coagulation Profile: Recent Labs  Lab 07/04/2020 0329  INR 1.8*   Cardiac Enzymes: No results for input(s):  CKTOTAL, CKMB, CKMBINDEX, TROPONINI in the last 168 hours. BNP (last 3 results) No results for input(s): PROBNP in the last 8760 hours. HbA1C: Recent Labs    06/29/2020 0329  HGBA1C 6.0*   CBG: No results for input(s): GLUCAP in the last 168 hours. Lipid Profile: Recent Labs    06/12/2020 0329  CHOL 144  HDL 72  LDLCALC 58  TRIG 68  CHOLHDL 2.0   Thyroid Function Tests: No results for input(s): TSH, T4TOTAL, FREET4, T3FREE, THYROIDAB in the last 72 hours. Anemia Panel: No results for input(s): VITAMINB12, FOLATE, FERRITIN, TIBC, IRON, RETICCTPCT in the last 72 hours. Urine analysis:    Component Value Date/Time   COLORURINE YELLOW 12/14/2017 [place] 12/14/2017 0757   LABSPEC 1.020 04/25/2018 0925   PHURINE 6.5 04/25/2018 0925   GLUCOSEU NEGATIVE 04/25/2018 0925   HGBUR NEGATIVE 04/25/2018 0925   BILIRUBINUR NEGATIVE 04/25/2018 0925   KETONESUR NEGATIVE 04/25/2018 0925   PROTEINUR 30 (A) 04/25/2018 0925   UROBILINOGEN 0.2 04/25/2018 0925   NITRITE NEGATIVE 04/25/2018 0925   LEUKOCYTESUR TRACE (A) 04/25/2018 0925   Sepsis Labs: @LABRCNTIP (procalcitonin:4,lacticidven:4)  ) Recent Results (from the past 240 hour(s))  Resp Panel by RT-PCR (Flu A&B, Covid) Nasopharyngeal Swab     Status: None   Collection Time: 07/03/2020  3:29 AM   Specimen: Nasopharyngeal Swab; Nasopharyngeal(NP) swabs in vial transport medium  Result Value Ref Range Status   SARS Coronavirus 2 by RT PCR NEGATIVE NEGATIVE Final    Comment: (NOTE) SARS-CoV-2 target nucleic acids are NOT DETECTED.  The SARS-CoV-2 RNA is generally detectable in upper respiratory specimens during the acute phase of infection. The lowest concentration of SARS-CoV-2 viral copies this assay can detect is 138 copies/mL. A negative result does not preclude SARS-Cov-2 infection and should not be used as the sole basis for treatment or other patient management decisions. A negative result may occur with   improper specimen collection/handling, submission of specimen other than nasopharyngeal swab, presence of viral mutation(s) within the areas targeted by this assay, and inadequate number of viral copies(<138 copies/mL). A negative result must be combined with clinical observations, patient history, and epidemiological information. The expected result is Negative.  Fact Sheet for Patients:  EntrepreneurPulse.com.au  Fact Sheet for Healthcare Providers:  IncredibleEmployment.be  This test is no t yet approved or cleared by the [place] FDA and  has been authorized for detection and/or diagnosis of SARS-CoV-2 by FDA under an Emergency Use Authorization (EUA). This EUA will remain  in effect (meaning this test can be used) for  the duration of the COVID-19 declaration under Section 564(b)(1) of the Act, 21 U.S.C.section 360bbb-3(b)(1), unless the authorization is terminated  or revoked sooner.       Influenza A by PCR NEGATIVE NEGATIVE Final   Influenza B by PCR NEGATIVE NEGATIVE Final    Comment: (NOTE) The Xpert Xpress SARS-CoV-2/FLU/RSV plus assay is intended as an aid in the diagnosis of influenza from Nasopharyngeal swab specimens and should not be used as a sole basis for treatment. Nasal washings and aspirates are unacceptable for Xpert Xpress SARS-CoV-2/FLU/RSV testing.  Fact Sheet for Patients: EntrepreneurPulse.com.au  Fact Sheet for Healthcare Providers: IncredibleEmployment.be  This test is not yet approved or cleared by the [place] FDA and has been authorized for detection and/or diagnosis of SARS-CoV-2 by FDA under an Emergency Use Authorization (EUA). This EUA will remain in effect (meaning this test can be used) for the duration of the COVID-19 declaration under Section 564(b)(1) of the Act, 21 U.S.C. section 360bbb-3(b)(1), unless the authorization is terminated  or revoked.  Performed at [facility], [place] [address] [place], [place] [address]       Studies: DG Chest 1 View  Result Date: 06/12/2020 CLINICAL DATA:  Status post right thoracentesis. Congestive heart failure. EXAM: CHEST  1 VIEW COMPARISON:  07/04/2020 FINDINGS: Small to moderate right pleural effusion appears similar to previous study. Moderate left pleural effusion is increased in size. No pneumothorax visualized. Cardiomegaly and diffuse interstitial infiltrates appear stable. Bibasilar compressive atelectasis again noted due to the pleural effusions. IMPRESSION: No significant change in small to moderate right pleural effusion. No pneumothorax visualized. Increased moderate left pleural effusion. Stable cardiomegaly and diffuse interstitial infiltrates consistent with edema. Electronically Signed   By: Marlaine Hind M.D.   On: 06/12/2020 13:39   US THORACENTESIS ASP PLEURAL SPACE W/IMG GUIDE  Result Date: 06/12/2020 INDICATION: Patient with history of congestive heart failure, bilateral pleural effusions. Request is made for diagnostic and therapeutic thoracentesis EXAM: ULTRASOUND GUIDED DIAGNOSTIC AND THERAPEUTIC RIGHT THORACENTESIS MEDICATIONS: 10 mL 1% lidocaine COMPLICATIONS: None immediate. PROCEDURE: An ultrasound guided thoracentesis was thoroughly discussed with the patient and questions answered. The benefits, risks, alternatives and complications were also discussed. The patient understands and wishes to proceed with the procedure. Written consent was obtained. Ultrasound was performed to localize and mark an adequate pocket of fluid in the right chest. The area was then prepped and draped in the normal sterile fashion. 1% Lidocaine was used for local anesthesia. Under ultrasound guidance a 6 Fr Safe-T-Centesis catheter was introduced. Thoracentesis was performed. The catheter was removed and a dressing applied. FINDINGS: A total of approximately 1.0 liters of yellow fluid was  removed. Samples were sent to the laboratory as requested by the clinical team. IMPRESSION: Successful ultrasound guided diagnostic and therapeutic right thoracentesis yielding 1.0 liters of pleural fluid. Read by: Brynda Greathouse PA-C Electronically Signed   By: Jerilynn Mages.  Shick M.D.   On: 06/12/2020 13:49    Scheduled Meds: . amiodarone  200 mg Oral BID  . apixaban  2.5 mg Oral BID  . mouth rinse  15 mL Mouth Rinse BID  . metoprolol succinate  25 mg Oral BID WC  . sodium chloride flush  3 mL Intravenous Q12H    Continuous Infusions: . sodium chloride       LOS: 2 days     Kayleen Memos, MD [facility] Pager [phone]  If 7PM-7AM, please contact night-coverage [email or web address] Password [facility] 06/12/2020, 3:41 PM

## 2020-06-12 NOTE — Progress Notes (Signed)
Placed patient on BiPAP per MD's order. 

## 2020-06-12 NOTE — TOC Initial Note (Signed)
Transition of Care [facility]) - Initial/Assessment Note    Patient Details  Name: Shelly Obrien MRN: [id number] Date of Birth: 08-30-1922  Transition of Care [facility]) CM/SW Contact:    Bary Castilla, LCSW Phone Number: [phone] 06/12/2020, 11:50 AM  Clinical Narrative:                  CSW spoke with patient's son [name] due to patient's orientation to discuss PT recommendation of a SNF. [name] was aware of recommendation and in agreement with going to a ST SNF. CSW discussed the SNF process.CSW provided [place] with medicare.gov rating webisite. [name] gave CSW permission to fax referrals out to local facilities.CSW answered questions about the SNF process and the next steps in the process. [name] informed CSW that patient has been to [facility] and would consider again due to when she went it was the start of the pandemic and she was isolated.   TOC team will continue to assist with discharge planning needs.    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) Lives with:: Self            Care giver support system in place?: Yes (comment)      Activities of Daily Living Home Assistive Devices/Equipment: Cytogeneticist (specify type),Eyeglasses ADL Screening (condition at time of admission) Patient's cognitive ability adequate to safely complete daily activities?: Yes Is the patient deaf or have difficulty hearing?: No Does the patient have difficulty seeing, even when wearing glasses/contacts?: No Does the patient have difficulty  concentrating, remembering, or making decisions?: No Patient able to express need for assistance with ADLs?: Yes Does the patient have difficulty dressing or bathing?: Yes Independently performs ADLs?: No Communication: Independent Dressing (OT): Needs assistance Is this a change from baseline?: Pre-admission baseline Grooming: Needs assistance Is this a change from baseline?: Pre-admission baseline Feeding: Independent Bathing: Needs assistance Is this a change from baseline?: Pre-admission baseline Toileting: Needs assistance Is this a change from baseline?: Pre-admission baseline In/Out Bed: Needs assistance Is this a change from baseline?: Pre-admission baseline Walks in Home: Needs assistance Is this a change from baseline?: Pre-admission baseline Does the patient have difficulty walking or climbing stairs?: Yes Weakness of Legs: Both Weakness of Arms/Hands: None  Permission Sought/Granted      Share Information with NAME: [name]  Permission granted to share info w AGENCY: SNF  Permission granted to share info w Relationship: son  Permission granted to share info w Contact Information: [phone]  Emotional Assessment   Attitude/Demeanor/Rapport: Unable to Assess Affect (typically observed): Unable to Assess Orientation: : Oriented to Self,Oriented to Place      Admission diagnosis:  Acute MI ([place]) [I21.9] Elevated troponin [R77.8] Acute kidney injury ([place]) [N17.9] Chest pain, unspecified type [R07.9] Acute congestive heart failure, unspecified heart failure type [facility]) [I50.9] Patient Active Problem List   Diagnosis Date Noted  .  Acute congestive heart failure ([place])   . Acute MI ([place]) June 26, 2020  . Prediabetes 2020-06-26  . Normocytic anemia 2020/06/26  . Acute on chronic systolic CHF (congestive heart failure) ([place]) 06/26/20  . Elevated troponin   . LV dysfunction   . Permanent atrial fibrillation ([place])   . Surgery, elective   . Hip fracture ([place])  01/12/2020  . Displaced fracture of right femoral neck ([place]) 01/12/2020  . Fall 01/12/2020  . Closed nondisplaced intertrochanteric fracture of right femur ([place])   . Severe sepsis ([place])   . Cellulitis of right leg 10/30/2019  . Hyperkalemia 10/30/2019  . SIRS (systemic inflammatory response syndrome) ([place]) 10/30/2019  . MGUS (monoclonal gammopathy of unknown significance) 10/22/2019  . Cellulitis 10/17/2019  . Acute kidney injury superimposed on chronic kidney disease ([place]) 10/17/2019  . DNR (do not resuscitate) 10/17/2019  . Longstanding persistent atrial fibrillation ([place])   . Cardiomyopathy ([place]) 07/14/2019  . Systolic CHF ([place]) 45/40/9811  . Peripheral neuropathy 08/12/2018  . B12 deficiency 08/12/2018  . Constipation   . Acute on chronic cholecystitis s/p lap cholecystectomy 12/06/2017 12/04/2017  . Nephrolithiasis 12/04/2017  . Abdominal pain 12/04/2017  . Spinal stenosis in cervical region 04/24/2017  . Spinal stenosis of lumbar region 04/24/2017  . Dizziness 04/24/2017  . Ureteral stone with hydronephrosis 01/20/2017  . Atrial fibrillation with RVR ([place]) 02/24/2016  . CKD (chronic kidney disease) stage 3, GFR 30-59 ml/min (HCC) 02/24/2016  . Exudative macular degeneration ([place]) 02/24/2016  . CHOLELITHIASIS 02/14/2009  . Hyperlipidemia 12/29/2008  . Essential hypertension 12/29/2008  . MYOCARDIAL INFARCTION, HX OF 12/29/2008  . CAD (coronary artery disease) 12/29/2008   PCP:  [name], MD Pharmacy:   [id number] - [place], [place] AT [place] [address] Phone: [phone] Fax: [phone]  [facility] [place] [place] - [address] [place] [address] Phone: [phone] Fax: [phone]     Social Determinants of Health (SDOH) Interventions    Readmission Risk Interventions Readmission Risk Prevention  Plan 01/14/2020  Transportation Screening Complete  PCP or Specialist Appt within 5-7 Days Complete  Home Care Screening Complete  Medication Review (RN CM) Complete  Some recent data might be hidden

## 2020-06-12 NOTE — Progress Notes (Addendum)
Progress Note  Patient Name: Shelly Obrien Date of Encounter: 06/12/2020  [facility] Cardiologist: Mertie Moores, MD   Subjective   Patient getting bath  No CP   Inpatient Medications    Scheduled Meds: . amiodarone  200 mg Oral BID  . aspirin EC  81 mg Oral Daily  . furosemide  20 mg Intravenous BID  . mouth rinse  15 mL Mouth Rinse BID  . metoprolol succinate  25 mg Oral BID WC  . sodium chloride flush  3 mL Intravenous Q12H   Continuous Infusions: . sodium chloride    . heparin 1,050 Units/hr (06/12/20 0443)   PRN Meds: sodium chloride, acetaminophen, ondansetron (ZOFRAN) IV, polyvinyl alcohol, sodium chloride flush   Vital Signs    Vitals:   06/12/20 0008 06/12/20 0345 06/12/20 0425 06/12/20 0536  BP:  96/67  106/79  Pulse:      Resp: 20   20  Temp: 97.7 F (36.5 C) 97.7 F (36.5 C)  97.6 F (36.4 C)  TempSrc: Axillary Axillary  Axillary  SpO2:      Weight:   69.8 kg   Height:        Intake/Output Summary (Last 24 hours) at 06/12/2020 0904 Last data filed at 06/12/2020 0526 Gross per 24 hour  Intake 580 ml  Output 1100 ml  Net -520 ml   Last 3 Weights 06/12/2020 06/20/2020 07/02/2020  Weight (lbs) 153 lb 12.8 oz 154 lb 5.2 oz 135 lb  Weight (kg) 69.763 kg 70 kg 61.236 kg      Telemetry    Afib rate 80s to 90s  - Personally Reviewed  ECG    NO new - Personally Reviewed  Physical Exam  Pleasant older, somewhat frail appearing white female GEN: No acute distress.   Neck: JVP is increased  Cardiac: Irreg Irreg, no murmurs, Respiratory: REl clear    GI: Soft, nontender, non-distended  MS:  Tr  LE edema; No deformity. Neuro:  Nonfocal  Psych: Normal affect   Labs    High Sensitivity Troponin:   Recent Labs  Lab 06/29/2020 0329 07/03/2020 0535  TROPONINIHS 12,241* 12,017*      Chemistry Recent Labs  Lab 06/14/2020 0329 06/24/2020 0554 06/11/20 0230 06/12/20 0223  NA 143 143 142 141  K 5.1 5.1 5.1 5.4*  CL 106 106 103 105  CO2 25  --  27  26  GLUCOSE 124* 105* 123* 108*  BUN 27* 40* 34* 43*  CREATININE 2.52* 2.40* 2.74* 2.65*  CALCIUM 9.7  --  9.5 9.4  PROT 6.6  --   --   --   ALBUMIN 3.3*  --   --   --   AST 83*  --   --   --   ALT 31  --   --   --   ALKPHOS 75  --   --   --   BILITOT 1.4*  --   --   --   GFRNONAA 17*  --  15* 16*  ANIONGAP 12  --  12 10     Hematology Recent Labs  Lab 06/27/2020 0329 06/25/2020 0554 06/11/20 0230 06/12/20 0223  WBC 8.8  --  11.7* 10.6*  RBC 4.42  --  4.38 4.37  HGB 11.5* 13.3 11.3* 11.3*  HCT 38.0 39.0 37.4 38.0  MCV 86.0  --  85.4 87.0  MCH 26.0  --  25.8* 25.9*  MCHC 30.3  --  30.2 29.7*  RDW 20.7*  --  20.6* 20.3*  PLT 275  --  244 232    BNP Recent Labs  Lab 07/02/2020 0329  BNP 2,431.5*     DDimer No results for input(s): DDIMER in the last 168 hours.   Radiology    ECHOCARDIOGRAM COMPLETE  Result Date: 07/04/2020    ECHOCARDIOGRAM REPORT   Patient Name:   Shelly Obrien Date of Exam: 06/26/2020 Medical Rec #:  [id number]     Height:       65.0 in Accession #:    [id number]    Weight:       135.0 lb Date of Birth:  [birth year]     BSA:          1.674 m Patient Age:    87 years      BP:           111/92 mmHg Patient Gender: F             HR:           127 bpm. Exam Location:  Inpatient Procedure: 2D Echo, Cardiac Doppler, Color Doppler and Intracardiac            Opacification Agent Indications:    STEMI I21.3  History:        Patient has prior history of Echocardiogram examinations, most                 recent 07/13/2019. Previous Myocardial Infarction and CAD,                 Arrythmias:Atrial Fibrillation; Risk Factors:Hypertension,                 Dyslipidemia and Former Smoker.  Sonographer:    Vickie Epley RDCS Referring Phys: [id number] RONDELL A SMITH IMPRESSIONS  1. Left ventricular ejection fraction, by estimation, is <20%. The left ventricle has severely decreased function. The left ventricle demonstrates global hypokinesis. The left ventricular internal cavity size was  mildly dilated. Left ventricular diastolic parameters are indeterminate.  2. Right ventricular systolic function is moderately reduced. The right ventricular size is mildly enlarged. There is moderately elevated pulmonary artery systolic pressure. The estimated right ventricular systolic pressure is 123456 mmHg.  3. Left atrial size was mildly dilated.  4. Right atrial size was moderately dilated.  5. The mitral valve is normal in structure. Trivial mitral valve regurgitation. No evidence of mitral stenosis.  6. The aortic valve is tricuspid. Aortic valve regurgitation is trivial. Mild to moderate aortic valve sclerosis/calcification is present, without any evidence of aortic stenosis.  7. The inferior vena cava is dilated in size with <50% respiratory variability, suggesting right atrial pressure of 15 mmHg. FINDINGS  Left Ventricle: Left ventricular ejection fraction, by estimation, is <20%. The left ventricle has severely decreased function. The left ventricle demonstrates global hypokinesis. Definity contrast agent was given IV to delineate the left ventricular endocardial borders. The left ventricular internal cavity size was mildly dilated. There is no left ventricular hypertrophy. Left ventricular diastolic parameters are indeterminate. Right Ventricle: The right ventricular size is mildly enlarged. No increase in right ventricular wall thickness. Right ventricular systolic function is moderately reduced. There is moderately elevated pulmonary artery systolic pressure. The tricuspid regurgitant velocity is 2.92 m/s, and with an assumed right atrial pressure of 15 mmHg, the estimated right ventricular systolic pressure is 123456 mmHg. Left Atrium: Left atrial size was mildly dilated. Right Atrium: Right atrial size was moderately dilated. Pericardium: There is no evidence of pericardial effusion. Mitral Valve:  The mitral valve is normal in structure. Trivial mitral valve regurgitation. No evidence of mitral valve  stenosis. Tricuspid Valve: The tricuspid valve is normal in structure. Tricuspid valve regurgitation is mild. Aortic Valve: The aortic valve is tricuspid. Aortic valve regurgitation is trivial. Mild to moderate aortic valve sclerosis/calcification is present, without any evidence of aortic stenosis. Pulmonic Valve: The pulmonic valve was normal in structure. Pulmonic valve regurgitation is not visualized. Aorta: The aortic root is normal in size and structure. Venous: The inferior vena cava is dilated in size with less than 50% respiratory variability, suggesting right atrial pressure of 15 mmHg. IAS/Shunts: No atrial level shunt detected by color flow Doppler.  LEFT VENTRICLE PLAX 2D LVOT diam:     2.30 cm LV SV:         23 LV SV Index:   14 LVOT Area:     4.15 cm  RIGHT VENTRICLE TAPSE (M-mode): 0.7 cm LEFT ATRIUM             Index       RIGHT ATRIUM           Index LA Vol (A2C):   58.1 ml 34.71 ml/m RA Area:     26.80 cm LA Vol (A4C):   64.6 ml 38.59 ml/m RA Volume:   102.00 ml 60.94 ml/m LA Biplane Vol: 65.3 ml 39.01 ml/m  AORTIC VALVE LVOT Vmax:   45.00 cm/s LVOT Vmean:  30.600 cm/s LVOT VTI:    0.055 m  AORTA Ao Root diam: 3.20 cm Ao Asc diam:  3.60 cm MR Peak grad: 24.0 mmHg   TRICUSPID VALVE MR Vmax:      245.00 cm/s TR Peak grad:   34.1 mmHg                           TR Vmax:        292.00 cm/s                            SHUNTS                           Systemic VTI:  0.05 m                           Systemic Diam: 2.30 cm Loralie Champagne MD Electronically signed by Loralie Champagne MD Signature Date/Time: 07/05/20/5:23:32 PM    Final     Cardiac Studies   ECHO   07-05-20  1. Left ventricular ejection fraction, by estimation, is <20%. The left ventricle has severely decreased function. The left ventricle demonstrates global hypokinesis. The left ventricular internal cavity size was mildly dilated. Left ventricular diastolic parameters are indeterminate. 2. Right ventricular systolic function is  moderately reduced. The right ventricular size is mildly enlarged. There is moderately elevated pulmonary artery systolic pressure. The estimated right ventricular systolic pressure is 53.6 mmHg. 3. Left atrial size was mildly dilated. 4. Right atrial size was moderately dilated. 5. The mitral valve is normal in structure. Trivial mitral valve regurgitation. No evidence of mitral stenosis. 6. The aortic valve is tricuspid. Aortic valve regurgitation is trivial. Mild to moderate aortic valve sclerosis/calcification is present, without any evidence of aortic stenosis. 7. The inferior vena cava is dilated in size with <50% respiratory variability, suggesting right atrial pressure of 15 mmHg.Echo: 07/2019  IMPRESSIONS  1. Left ventricular ejection fraction, by estimation, is 25 to 30%. The  left ventricle has severely decreased function. The left ventricle  demonstrates regional wall motion abnormalities (see scoring  diagram/findings for description). Diffuse  hypokinesis with inferoseptal akinesis. Left ventricular diastolic  parameters are indeterminate.  2. Right ventricular systolic function is normal. There is moderately  elevated pulmonary artery systolic pressure. The estimated right  ventricular systolic pressure is 96.7 mmHg.  3. Mild mitral valve regurgitation.  4. The aortic valve is tricuspid. Aortic sclerosis without significant  stenosis. No regurgitation.  5. Dilated IVC with estimate RA pressure 15 mmHg.  6. The patient was in atrial fibrillation.   Patient Profile     87 y.o. female  with a hx of CAD and remote h/o MI/PCI, chronic/persistent AF on eliquis for CVA prophylaxis, ICM, CKD, MGUS, recent hip fracture in August of 2021 s/p intramedullary fixation on 01-13-20, HTN who is being seen today for the evaluation of CP/DOE and abnormal EKG showing possible STEMI at the request of Dr. Dayna Barker [facility]).  Assessment & Plan    1. NSTEMI with abnormal EKG: suspect  she could have had plaque rupture for true STEMI but given advanced age and co-morbidities, plan is to treat conservatively. Has some mild left arm discomfort this morning. hsTn [id number].  -- Stop heparin today  Follow clinic ally    2. Chronic Afib: has been on Eliquis and amiodarone prior to admission. Eliquis held, now on IV heparin with NSTEMI.  -- continue amiodarone, Low dose toprol added  Rates better  Starting Eliquis today    3. Acute on chronic systolic HF: k Note lasix written for   Will follow    4. AKI on CKD: baseline around 1.4 (Aug 2021)  It is 2.65 today   I would hold lasix   May have gotten 1 dose earlier    For questions or updates, please contact [facility] Please consult [email or web address] for contact info under        Signed, Dorris Carnes, MD  06/12/2020, 9:04 AM

## 2020-06-12 NOTE — Progress Notes (Addendum)
ANTICOAGULATION CONSULT NOTE - Follow Up Consult  Pharmacy Consult for heparin Indication: Afib and NSTEMI  Labs: Recent Labs    06/08/2020 0329 06/20/2020 0535 07/03/2020 0554 06/28/2020 1717 06/11/20 0230 06/11/20 1039 06/12/20 0223  HGB 11.5*  --  13.3  --  11.3*  --  11.3*  HCT 38.0  --  39.0  --  37.4  --  38.0  PLT 275  --   --   --  244  --  232  APTT 32  --   --    < > 75* 85* 58*  LABPROT 20.5*  --   --   --   --   --   --   INR 1.8*  --   --   --   --   --   --   HEPARINUNFRC  --   --   --   --  >2.20*  --  1.60*  CREATININE 2.52*  --  2.40*  --  2.74*  --  2.65*  TROPONINIHS 12,241* 12,017*  --   --   --   --   --    < > = values in this interval not displayed.    Assessment: 87yo female subtherapeutic on heparin after two PTTs at goal; no gtt issues or signs of bleeding per RN.  Goal of Therapy:  aPTT 66-102 seconds   Plan:  Will increase heparin gtt by 2 units/kg/hr to 1050 units/hr and check PTT in 8 hours.    Wynona Neat, PharmD, BCPS  06/12/2020,4:41 AM

## 2020-06-12 NOTE — Progress Notes (Signed)
Daily Progress Note   Patient Name: Shelly Obrien       Date: 06/12/2020 DOB: 06-11-1922  Age: 87 y.o. MRN#: [id number] Attending Physician: Kayleen Memos, DO Primary Care Physician: [name], MD Admit Date: 06/04/2020  Reason for Follow-up: Establishing goals of care  Subjective: Patient is status post right thoracentesis earlier today with removal of 1L fluid.   17:15--patient is currently on BiPAP for elevated CO2 on ABG earlier today. She opens her eyes to voice, follows commands.   RN reports she had to hold this evening's dose of metoprolol due to borderline blood pressure.   17:50--Spoke with son [name] at length by phone and provided an updated on the patient's condition. Expressed concern that she  was fragile and is at high risk for decompensation.  Reviewed with him the echo results from 1/7; discussed that EF is less than 20%.   I introduced the concept of a comfort path to [place] and encouraged him to think about at what point patient would want to stop aggressive medical interventions and focus on comfort and dignity rather than cure/prolonging life. Introduced hospice philosophy and provided information on home vs residential hospice services.   [name] is very open to hospice care, but patient is unable to safely return to her home (in [place]) and [name] is currently unable to provide care at his home (in [place]). He would need to completely empty out his spare bedroom to accomodate.   If patient stabilizes enough for disposition of SNF for rehab, I offered and explained the option for an outpatient palliative referral to follow the patient at the facility and check-in with the family to continue goals of care discussions.  Discussed that outpatient palliative can  also help with transition to hospice care when needed.  In the event patient declines further, and prognosis is clearly more limited, family would be open to transition to residential hospice.     Length of Stay: 2  Current Medications: Scheduled Meds:  . amiodarone  200 mg Oral BID  . apixaban  2.5 mg Oral BID  . insulin aspart  0-6 Units Subcutaneous TID WC  . mouth rinse  15 mL Mouth Rinse BID  . metoprolol succinate  25 mg Oral BID WC  . sodium chloride flush  3 mL Intravenous Q12H  Continuous Infusions: . sodium chloride      PRN Meds: sodium chloride, acetaminophen, ondansetron (ZOFRAN) IV, polyvinyl alcohol, sodium chloride flush  Physical Exam Vitals reviewed.  Constitutional:      General: She is not in acute distress.    Appearance: She is ill-appearing.  Cardiovascular:     Rate and Rhythm: Normal rate.     Comments: On BiPAP Neurological:     Mental Status: She is lethargic.     Comments: Follows commands             Vital Signs: BP 93/73   Pulse 69   Temp 97.6 F (36.4 C) (Axillary)   Resp 20   Ht 5\' 5"  (1.651 m)   Wt 69.8 kg   SpO2 100%   BMI 25.59 kg/m  SpO2: SpO2: 100 %  Intake/output summary:   Intake/Output Summary (Last 24 hours) at 06/12/2020 1715 Last data filed at 06/12/2020 3212 Gross per 24 hour  Intake 320 ml  Output 1100 ml  Net -780 ml   LBM: Last BM Date: 07-08-20 Baseline Weight: Weight: 61.2 kg Most recent weight: Weight: 69.8 kg       Palliative Assessment/Data: PPS 20%     Palliative Care Assessment & Plan   HPI/Patient Profile: 87 y.o. female  with past medical history of Atrial fibrillation on Eliquis, chronic systolic CHF (last EF 24-82%), CKD stage III, hypertension, hyperlipidemia, and CAD s/p PCI who presented to the emergency department on 07/08/20 with chest pain that had awakened her from sleep. Associated symptoms included shortness of breath worsening over the past week, non-productive cough, fatigue, and  LE swelling.   ED Course: EKG showing signs of ST elevation in V4-5 so code STEMI was called and later cancelled by cardiology. Chest x-ray showed diffuse interstitial pulmonary infiltrates and bilateral pleural effusions concerning for heart failure. Creatinine 2.52. Patient admitted to [facility] for management of acute MI, acute on chronic systolic CHF, and AKI.   Assessment: - acute MI - AKI on CKD - acute on chronic CHF with EF < 20% - persistent atrial fibrillation - acute hypercarbic respiratory failure  Recommendations/Plan:  DNR/DNI as previously documented  Continue current medical care  Family is open to hospice care but is unable to provide this in the home setting  If patient stabilizes, plan would be SNF for rehab with outpatient palliative follow-up (family prefers [facility])  If patient declines, family is open to residential hospice Dance movement psychotherapist)  PMT will continue to follow   Code Status:  DNR/DNI  Prognosis:   poor  Discharge Planning:  To Be Determined  Care plan was discussed with Dr. Nevada Crane and nursing  Thank you for allowing the Palliative Medicine Team to assist in the care of this patient.   Total Time 35 minutes Prolonged Time Billed  no       Greater than 50%  of this time was spent counseling and coordinating care related to the above assessment and plan.  Lavena Bullion, NP  Please contact Palliative Medicine Team phone at [phone] for questions and concerns.

## 2020-06-12 NOTE — Procedures (Addendum)
PROCEDURE SUMMARY:  Patient initially said she could sit up on the side of the bed, however it became evident quickly that she was too weak for this. She is alert and oriented x3 when aroused, however quickly falls asleep.  She is not consentable and her son, [name], was called for consent.  Repositioned in bed for procedure, however initial difficulty positioning with stable vital signs- deoxygenates quickly, mouth-breathing on nasal cannula. We were able to find a position with accessible window where she was stable. Procedure performed.  No immediate complications. Successful US guided right diagnostic and therapeutic thoracentesis. Yielded 1.0 liters of yellow fluid, however stopped prior to removal of all fluid due to hypotension.  Removed catheter and repositioned patient.  She remain alert and oriented throughout when prompted. Blood pressure returned to 104/70 after procedure stopped. Pt tolerated procedure fair.  Specimen was sent for labs. CXR ordered.  EBL < 5 mL  Docia Barrier PA-C 06/12/2020 12:21 PM

## 2020-06-12 NOTE — [place] FL2 (Signed)
[place] MEDICAID FL2 LEVEL OF CARE SCREENING TOOL     IDENTIFICATION  Patient Name: Shelly Obrien Birthdate: 06/18/1922 Sex: female Admission Date (Current Location): June 12, 2020  [facility] and [place] Number:  Herbalist and Address:  [facility]. [facility], [place] [address], [place], [place] [address]      Provider Number: [id number]  Attending Physician Name and Address:  Kayleen Memos, DO  Relative Name and Phone Number:  EQAST [phone]    Current Level of Care: Hospital Recommended Level of Care: [place] Prior Approval Number:    Date Approved/Denied:   PASRR Number: [id number]  Discharge Plan: SNF    Current Diagnoses: Patient Active Problem List   Diagnosis Date Noted  . Acute congestive heart failure ([place])   . Acute MI ([place]) 06-12-20  . Prediabetes 06-12-20  . Normocytic anemia June 12, 2020  . Acute on chronic systolic CHF (congestive heart failure) ([place]) Jun 12, 2020  . Elevated troponin   . LV dysfunction   . Permanent atrial fibrillation ([place])   . Surgery, elective   . Hip fracture ([place]) 01/12/2020  . Displaced fracture of right femoral neck ([place]) 01/12/2020  . Fall 01/12/2020  . Closed nondisplaced intertrochanteric fracture of right femur ([place])   . Severe sepsis ([place])   . Cellulitis of right leg 10/30/2019  . Hyperkalemia 10/30/2019  . SIRS (systemic inflammatory response syndrome) ([place]) 10/30/2019  . MGUS (monoclonal gammopathy of unknown significance) 10/22/2019  . Cellulitis 10/17/2019  . Acute kidney injury superimposed on chronic kidney disease ([place]) 10/17/2019  . DNR (do not resuscitate) 10/17/2019  . Longstanding persistent atrial fibrillation ([place])   . Cardiomyopathy ([place]) 07/14/2019  . Systolic CHF ([place]) 01/15/4817  . Peripheral neuropathy 08/12/2018  . B12 deficiency 08/12/2018  . Constipation   . Acute on chronic cholecystitis s/p lap cholecystectomy 12/06/2017 12/04/2017  . Nephrolithiasis  12/04/2017  . Abdominal pain 12/04/2017  . Spinal stenosis in cervical region 04/24/2017  . Spinal stenosis of lumbar region 04/24/2017  . Dizziness 04/24/2017  . Ureteral stone with hydronephrosis 01/20/2017  . Atrial fibrillation with RVR ([place]) 02/24/2016  . CKD (chronic kidney disease) stage 3, GFR 30-59 ml/min (HCC) 02/24/2016  . Exudative macular degeneration ([place]) 02/24/2016  . CHOLELITHIASIS 02/14/2009  . Hyperlipidemia 12/29/2008  . Essential hypertension 12/29/2008  . MYOCARDIAL INFARCTION, HX OF 12/29/2008  . CAD (coronary artery disease) 12/29/2008    Orientation RESPIRATION BLADDER Height & Weight     Self,Time  O2 (2L) Incontinent,External catheter Weight: 153 lb 12.8 oz (69.8 kg) Height:  5\' 5"  (165.1 cm)  BEHAVIORAL SYMPTOMS/MOOD NEUROLOGICAL BOWEL NUTRITION STATUS      Incontinent Diet (see discharge summary)  AMBULATORY STATUS COMMUNICATION OF NEEDS Skin   Extensive Assist Verbally Normal (dry)                       Personal Care Assistance Level of Assistance  Bathing,Feeding,Dressing Bathing Assistance: Limited assistance Feeding assistance: Independent Dressing Assistance: Limited assistance     Functional Limitations Info  Sight,Hearing,Speech Sight Info: Impaired Hearing Info: Adequate Speech Info: Adequate    SPECIAL CARE FACTORS FREQUENCY  PT (By licensed PT),OT (By licensed OT)     PT Frequency: 5x per week OT Frequency: 5x per week            Contractures Contractures Info: Present    Additional Factors Info  Code Status,Allergies Code Status Info: DNR Allergies Info: Antihistamines, Chlorpheniramine-type, Penicillins, Contrast Media (Iodinated  Diagnostic Agents), Sulfonamide Derivatives, Atorvastatin, Pheniramine, Sulfa Antibiotics           Current Medications (06/12/2020):  This is the current hospital active medication list Current Facility-Administered Medications  Medication Dose Route Frequency Provider Last Rate Last  Admin  . 0.9 %  sodium chloride infusion  250 mL Intravenous PRN Fuller Plan A, MD      . acetaminophen (TYLENOL) tablet 650 mg  650 mg Oral Q4H PRN Fuller Plan A, MD   650 mg at 07-Jul-2020 0944  . amiodarone (PACERONE) tablet 200 mg  200 mg Oral BID Fuller Plan A, MD   200 mg at 06/12/20 0909  . apixaban (ELIQUIS) tablet 2.5 mg  2.5 mg Oral BID Rebbeca Paul B, RPH      . lidocaine (PF) (XYLOCAINE) 1 % injection           . MEDLINE mouth rinse  15 mL Mouth Rinse BID Fuller Plan A, MD   15 mL at 06/12/20 0911  . metoprolol succinate (TOPROL-XL) 24 hr tablet 25 mg  25 mg Oral BID WC Fay Records, MD   25 mg at 06/12/20 0908  . ondansetron (ZOFRAN) injection 4 mg  4 mg Intravenous Q6H PRN Smith, Rondell A, MD      . polyvinyl alcohol (LIQUIFILM TEARS) 1.4 % ophthalmic solution 1 drop  1 drop Both Eyes BID PRN Rise Patience, MD   1 drop at Jul 07, 2020 2226  . sodium chloride flush (NS) 0.9 % injection 3 mL  3 mL Intravenous Q12H Smith, Rondell A, MD   3 mL at 06/12/20 0911  . sodium chloride flush (NS) 0.9 % injection 3 mL  3 mL Intravenous PRN Norval Morton, MD         Discharge Medications: Please see discharge summary for a list of discharge medications.  Relevant Imaging Results:  Relevant Lab Results:   Additional Information SSN# [id number] patient has had both [place], LCSW

## 2020-06-12 NOTE — Progress Notes (Signed)
[place] for Apixaban Indication: atrial fibrillation  Allergies  Allergen Reactions  . Antihistamines, Chlorpheniramine-Type Other (See Comments)    Pt states that she passed out.   [name] Penicillins Anaphylaxis and Other (See Comments)    Has patient had a PCN reaction causing immediate rash, facial/tongue/throat swelling, SOB or lightheadedness with hypotension: Yes Has patient had a PCN reaction causing severe rash involving mucus membranes or skin necrosis: No Has patient had a PCN reaction that required hospitalization No Has patient had a PCN reaction occurring within the last 10 years: No If all of the above answers are "NO", then may proceed with Cephalosporin use.  . Contrast Media [Iodinated Diagnostic Agents] Hives  . Sulfonamide Derivatives Other (See Comments)    Reaction:  Unknown   . Atorvastatin Other (See Comments)    myalgias  . Pheniramine Other (See Comments)    Pt states that she passed out.   . Sulfa Antibiotics Rash    Patient Measurements: Height: 5\' 5"  (165.1 cm) Weight: 69.8 kg (153 lb 12.8 oz) IBW/kg (Calculated) : 57 Heparin Dosing Weight: 70 kg  Vital Signs: Temp: 97.6 F (36.4 C) (01/09 0536) Temp Source: Axillary (01/09 0536) BP: 103/70 (01/09 0908) Pulse Rate: 74 (01/09 0908)  Labs: Recent Labs    07/03/2020 0329 06/24/2020 0535 06/05/2020 0554 06/15/2020 1717 06/11/20 0230 06/11/20 1039 06/12/20 0223  HGB 11.5*  --  13.3  --  11.3*  --  11.3*  HCT 38.0  --  39.0  --  37.4  --  38.0  PLT 275  --   --   --  244  --  232  APTT 32  --   --    < > 75* 85* 58*  LABPROT 20.5*  --   --   --   --   --   --   INR 1.8*  --   --   --   --   --   --   HEPARINUNFRC  --   --   --   --  >2.20*  --  1.60*  CREATININE 2.52*  --  2.40*  --  2.74*  --  2.65*  TROPONINIHS 12,241* 12,017*  --   --   --   --   --    < > = values in this interval not displayed.    Estimated Creatinine Clearance: 12.4 mL/min (A) (by C-G  formula based on SCr of 2.65 mg/dL (H)).   Medical History: Past Medical History:  Diagnosis Date  . Aortic ectasia ([place])   . Arm fracture 2013   right, Dr. Sharen Heck  . B12 deficiency   . CAD S/P percutaneous coronary angioplasty    hx MI 04/ 1994  s/p  PCI to LAD  . CKD (chronic kidney disease), stage III ([place])   . Complication of anesthesia    hard to wake   . Gallstones   . History of basal cell carcinoma (BCC) excision    right eye area 08/ 2013  . History of kidney stones 01/20/2017  . History of MI (myocardial infarction) 09/1992   s/p  PCI to LAD  . History of squamous cell carcinoma excision    right lower leg 2015 ;  2016  . Hx of colonic polyps   . Hyperlipidemia   . Hypertension   . Macular degeneration   . Osteoporosis   . PAF (paroxysmal atrial fibrillation) ([place]) dx 02-24-2016   followed by cardiologist-- dr  p. ross  . Pernicious anemia   . PONV (postoperative nausea and vomiting)   . Rib fracture 07/2018   left  . Right ureteral stone   . Wears glasses     Medications:  Medications Prior to Admission  Medication Sig Dispense Refill Last Dose  . amiodarone (PACERONE) 200 MG tablet Take 200 mg by mouth 2 (two) times daily.   06/09/2020 at Unknown time  . apixaban (ELIQUIS) 5 MG TABS tablet Take 5 mg by mouth 2 (two) times daily.   06/09/2020 at 1600  . furosemide (LASIX) 20 MG tablet Take 20 mg by mouth daily.   06/09/2020 at Unknown time  . metoprolol tartrate (LOPRESSOR) 50 MG tablet Take 50 mg by mouth daily.   06/09/2020 at 0800  . Multiple Vitamins-Minerals (ICAPS) CAPS Take 1 capsule by mouth 2 (two) times daily.    06/09/2020 at Unknown time  . Polyethyl Glycol-Propyl Glycol (SYSTANE) 0.4-0.3 % SOLN Place 1 drop into both eyes 2 (two) times daily as needed (dry eyes).   06/08/2020    Assessment: 89 YOF who presents with chest pain to start IV heparin for ACS/STEMI. Planning medical management given her advanced age. [name], patient is on apixaban at home and  last dose was evening of 1/6. Cards planning for 48 hours of IV heparin (will end 1/9).   Pharmacy has been consulted to switch heparin back to PTA apixaban as it has been >48 hours since IV heparin was started. Patient takes apixaban 5 mg BID for afib PTA. However, given age >50 and now elevated Scr >1.5 (currently 2.65), patient now meets criteria for reduced dosing. Pharmacy will continue to monitor Scr and adjust dose if Scr improves <1.5. Spoke with nurse, instructed to turn off heparin gtt when give first apixaban dose.   Goal of Therapy:  Monitor platelets by anticoagulation protocol: Yes   Plan:  Start apixaban 2.5 mg PO BID Discontinue heparin gtt when first dose of apixaban given Monitor Scr, CBC  Rebbeca Paul, PharmD PGY1 Pharmacy Resident 06/12/2020 9:36 AM  Please check [email or web address] for unit-specific pharmacy phone numbers.

## 2020-06-13 DIAGNOSIS — Z789 Other specified health status: Secondary | ICD-10-CM

## 2020-06-13 DIAGNOSIS — N179 Acute kidney failure, unspecified: Secondary | ICD-10-CM | POA: Diagnosis not present

## 2020-06-13 DIAGNOSIS — I5021 Acute systolic (congestive) heart failure: Secondary | ICD-10-CM | POA: Diagnosis not present

## 2020-06-13 DIAGNOSIS — N189 Chronic kidney disease, unspecified: Secondary | ICD-10-CM

## 2020-06-13 DIAGNOSIS — Z7189 Other specified counseling: Secondary | ICD-10-CM

## 2020-06-13 DIAGNOSIS — Z9889 Other specified postprocedural states: Secondary | ICD-10-CM

## 2020-06-13 DIAGNOSIS — I2510 Atherosclerotic heart disease of native coronary artery without angina pectoris: Secondary | ICD-10-CM | POA: Diagnosis not present

## 2020-06-13 DIAGNOSIS — I5023 Acute on chronic systolic (congestive) heart failure: Secondary | ICD-10-CM

## 2020-06-13 DIAGNOSIS — I213 ST elevation (STEMI) myocardial infarction of unspecified site: Secondary | ICD-10-CM | POA: Diagnosis not present

## 2020-06-13 DIAGNOSIS — J9601 Acute respiratory failure with hypoxia: Secondary | ICD-10-CM

## 2020-06-13 DIAGNOSIS — Z515 Encounter for palliative care: Secondary | ICD-10-CM

## 2020-06-13 LAB — GRAM STAIN

## 2020-06-13 LAB — GLUCOSE, CAPILLARY
Glucose-Capillary: 72 mg/dL (ref 70–99)
Glucose-Capillary: 132 mg/dL — ABNORMAL HIGH (ref 70–99)
Glucose-Capillary: 105 mg/dL — ABNORMAL HIGH (ref 70–99)

## 2020-06-13 LAB — BASIC METABOLIC PANEL
Anion gap: 9 (ref 5–15)
BUN: 53 mg/dL — ABNORMAL HIGH (ref 8–23)
CO2: 26 mmol/L (ref 22–32)
Calcium: 9.3 mg/dL (ref 8.9–10.3)
Chloride: 103 mmol/L (ref 98–111)
Creatinine, Ser: 2.57 mg/dL — ABNORMAL HIGH (ref 0.44–1.00)
GFR, Estimated: 17 mL/min — ABNORMAL LOW (ref >60)
Glucose, Bld: 102 mg/dL — ABNORMAL HIGH (ref 70–99)
Potassium: 5.2 mmol/L — ABNORMAL HIGH (ref 3.5–5.1)
Sodium: 138 mmol/L (ref 135–145)

## 2020-06-13 LAB — PH, BODY FLUID: pH, Body Fluid: 7.3

## 2020-06-13 LAB — BLOOD GAS, VENOUS
Acid-Base Excess: 2.7 mmol/L — ABNORMAL HIGH (ref 0.0–2.0)
Bicarbonate: 28.7 mmol/L — ABNORMAL HIGH (ref 20.0–28.0)
O2 Saturation: 47.5 %
Patient temperature: 36.4
pCO2, Ven: 59.6 mmHg (ref 44.0–60.0)
pH, Ven: 7.299 (ref 7.250–7.430)
pO2, Ven: 28.8 mmHg — CL (ref 32.0–45.0)

## 2020-06-13 LAB — CBC
HCT: 39.3 % (ref 36.0–46.0)
Hemoglobin: 10.9 g/dL — ABNORMAL LOW (ref 12.0–15.0)
MCH: 24.3 pg — ABNORMAL LOW (ref 26.0–34.0)
MCHC: 27.7 g/dL — ABNORMAL LOW (ref 30.0–36.0)
MCV: 87.7 fL (ref 80.0–100.0)
Platelets: 218 10*3/uL (ref 150–400)
RBC: 4.48 MIL/uL (ref 3.87–5.11)
RDW: 20.1 % — ABNORMAL HIGH (ref 11.5–15.5)
WBC: 9 10*3/uL (ref 4.0–10.5)
nRBC: 0.7 % — ABNORMAL HIGH (ref 0.0–0.2)

## 2020-06-13 MED ORDER — FUROSEMIDE 10 MG/ML IJ SOLN
20.0000 mg | Freq: Two times a day (BID) | INTRAMUSCULAR | Status: DC
  Administered 2020-06-13: 20 mg via INTRAVENOUS
  Filled 2020-06-13 (×2): qty 2

## 2020-06-13 MED ORDER — MIDODRINE HCL 5 MG PO TABS: 5.0000 mg | ORAL_TABLET | Freq: Two times a day (BID) | ORAL | Status: DC

## 2020-06-13 MED ORDER — FUROSEMIDE 10 MG/ML IJ SOLN: 20.0000 mg | Freq: Every day | INTRAMUSCULAR | Status: DC

## 2020-06-14 DIAGNOSIS — J90 Pleural effusion, not elsewhere classified: Secondary | ICD-10-CM | POA: Diagnosis present

## 2020-06-14 DIAGNOSIS — J811 Chronic pulmonary edema: Secondary | ICD-10-CM | POA: Diagnosis present

## 2020-06-14 DIAGNOSIS — R262 Difficulty in walking, not elsewhere classified: Secondary | ICD-10-CM | POA: Diagnosis present

## 2020-06-14 DIAGNOSIS — R5381 Other malaise: Secondary | ICD-10-CM | POA: Diagnosis present

## 2020-06-14 DIAGNOSIS — J9601 Acute respiratory failure with hypoxia: Secondary | ICD-10-CM | POA: Diagnosis present

## 2020-06-14 DIAGNOSIS — R7302 Impaired glucose tolerance (oral): Secondary | ICD-10-CM | POA: Diagnosis present

## 2020-06-14 DIAGNOSIS — I482 Chronic atrial fibrillation, unspecified: Secondary | ICD-10-CM | POA: Diagnosis present

## 2020-06-14 DIAGNOSIS — J9602 Acute respiratory failure with hypercapnia: Secondary | ICD-10-CM | POA: Diagnosis present

## 2020-06-14 NOTE — Discharge Summary (Signed)
Death Summary  Shelly Obrien [id number] DOB: 1922-12-26 DOA: 06-26-2020  PCP: [name], MD  Admit date: 26-Jun-2020 Date of Death: June 29, 2020 Time of Death: Sep 27, 1921 Notification: [name], MD notified of death of 06-30-20   History of present illness:  Shelly Obrien is a 87 y.o. female with a history of essential hypertension, hyperlipidemia, right femur fracture repair in August 2021, CADS/P PCI in 09/26/1992', chronic A. fib on Eliquis, systolic CHF 25 to 123456 CKD stage III who presented to [facility] ED on 2020-06-26 with complaints of sudden onset chest pain, pressure-like centrally located with radiation to the back waking her out of her sleep the morning of her presentation. At baseline her mentation is intact, she lives alone and gets around with the use of a cane and a wheelchair.  Associative symptoms included shortness of breath worsened over the last week, nonproductive cough,worsening fatigue, and lower extremity swelling.  She has been taking all of her medications as prescribed and has not missed any doses. Sitting up slightly improved her dyspnea.  In route with EMS patient was given 1 sublingual nitroglycerin and 324 mg of aspirin. EMS reported that blood pressure dropped 40 points with nitroglycerin from the 150's/90'sdown to the 110's/60's. Patient was initially called in as a code STEMI, but was canceled by cardiology.  ED Course:Labs significant for hemoglobin 11.5K, BUN 27, creatinine 2.52, hemoglobin A1c 6.0, LDL 58,high-sensitivity troponin 12,241->12,017. Influenza and COVID-19 screening were negative.  Chest x-ray revealed cardiomegaly, diffuse interstitial pulmonary infiltrate and bilateral pleural effusions right greater than left, concerning for heart failure exacerbation.She was seen by cardiology, recommended medical management, supportive care, given age, acute kidney injury, and other comorbidities.  ABG done on 06/11/2020 revealed acute hypoxic and hypercarbic  respiratory failure with pH of 7.2 and PCO2 of 55.  VBG done on 06/12/2020 showed pH of 7.2 with PCO2 of 63.  Ordered BiPAP, however patient was not able to tolerate it and declined to wear it after a brief trial.  She was seen by palliative care team who spoke with her son.  Family leaned towards discharging to rehab when hemodynamically stable with outpatient palliative care and then later possibly transition to residential hospice if her condition worsened.  The evening of Jun 29, 2020, the patient had agonal breathing.   She deceased on 06/29/20 at 09/27/1921.  She was DNR.   Final Diagnoses:   Principal Problem:   Acute MI ([place]) Active Problems:   CAD (coronary artery disease)   Longstanding persistent atrial fibrillation (HCC)   AKI (acute kidney injury) ([place])   DNR (do not resuscitate)   Prediabetes   Normocytic anemia   Acute on chronic systolic CHF (congestive heart failure) (HCC)   Acute congestive heart failure (HCC)   Acute respiratory failure with hypoxia and hypercapnia (HCC)   Pulmonary edema   Bilateral pleural effusion   Chronic a-fib (HCC)   Impaired glucose tolerance   Physical debility   Ambulatory dysfunction   The results of significant diagnostics from this hospitalization (including imaging, microbiology, ancillary and laboratory) are listed below for reference.    Significant Diagnostic Studies: DG Chest 1 View  Result Date: 06/12/2020 CLINICAL DATA:  Status post right thoracentesis. Congestive heart failure. EXAM: CHEST  1 VIEW COMPARISON:  06-26-20 FINDINGS: Small to moderate right pleural effusion appears similar to previous study. Moderate left pleural effusion is increased in size. No pneumothorax visualized. Cardiomegaly and diffuse interstitial infiltrates appear stable. Bibasilar compressive atelectasis again noted due to the pleural effusions. IMPRESSION:  No significant change in small to moderate right pleural effusion. No pneumothorax visualized. Increased  moderate left pleural effusion. Stable cardiomegaly and diffuse interstitial infiltrates consistent with edema. Electronically Signed   By: Marlaine Hind M.D.   On: 06/12/2020 13:39   DG Chest Portable 1 View  Result Date: 06/11/2020 CLINICAL DATA:  Chest pain, dyspnea EXAM: PORTABLE CHEST 1 VIEW COMPARISON:  01/14/2020 FINDINGS: Since the prior examination, moderate right and probable tiny left pleural effusions have developed. There is interval development of moderate interstitial pulmonary infiltrate, asymmetrically more severe throughout the right lung. Parenchymal cyst is again noted within the right mid lung zone, more conspicuous due to overlying asymmetric pulmonary infiltrate. No pneumothorax. Mild cardiomegaly appears unchanged though the cardiac silhouette is partially obscured by pleural fluid. No acute bone abnormality. IMPRESSION: Diffuse interstitial pulmonary infiltrate and bilateral pleural effusions, right greater than left, most suggestive of mild to moderate cardiogenic failure with asymmetric edema involving the right lung. In Electronically Signed   By: Fidela Salisbury MD   On: 06/19/2020 03:41   ECHOCARDIOGRAM COMPLETE  Result Date: 06/26/2020    ECHOCARDIOGRAM REPORT   Patient Name:   Shelly Obrien Date of Exam: 06/17/2020 Medical Rec #:  [id number]     Height:       65.0 in Accession #:    [id number]    Weight:       135.0 lb Date of Birth:  [birth year]     BSA:          1.674 m Patient Age:    89 years      BP:           111/92 mmHg Patient Gender: F             HR:           127 bpm. Exam Location:  Inpatient Procedure: 2D Echo, Cardiac Doppler, Color Doppler and Intracardiac            Opacification Agent Indications:    STEMI I21.3  History:        Patient has prior history of Echocardiogram examinations, most                 recent 07/13/2019. Previous Myocardial Infarction and CAD,                 Arrythmias:Atrial Fibrillation; Risk Factors:Hypertension,                 Dyslipidemia and  Former Smoker.  Sonographer:    Vickie Epley RDCS Referring Phys: [id number] RONDELL A SMITH IMPRESSIONS  1. Left ventricular ejection fraction, by estimation, is <20%. The left ventricle has severely decreased function. The left ventricle demonstrates global hypokinesis. The left ventricular internal cavity size was mildly dilated. Left ventricular diastolic parameters are indeterminate.  2. Right ventricular systolic function is moderately reduced. The right ventricular size is mildly enlarged. There is moderately elevated pulmonary artery systolic pressure. The estimated right ventricular systolic pressure is 123456 mmHg.  3. Left atrial size was mildly dilated.  4. Right atrial size was moderately dilated.  5. The mitral valve is normal in structure. Trivial mitral valve regurgitation. No evidence of mitral stenosis.  6. The aortic valve is tricuspid. Aortic valve regurgitation is trivial. Mild to moderate aortic valve sclerosis/calcification is present, without any evidence of aortic stenosis.  7. The inferior vena cava is dilated in size with <50% respiratory variability, suggesting right atrial pressure of 15 mmHg. FINDINGS  Left Ventricle:  Left ventricular ejection fraction, by estimation, is <20%. The left ventricle has severely decreased function. The left ventricle demonstrates global hypokinesis. Definity contrast agent was given IV to delineate the left ventricular endocardial borders. The left ventricular internal cavity size was mildly dilated. There is no left ventricular hypertrophy. Left ventricular diastolic parameters are indeterminate. Right Ventricle: The right ventricular size is mildly enlarged. No increase in right ventricular wall thickness. Right ventricular systolic function is moderately reduced. There is moderately elevated pulmonary artery systolic pressure. The tricuspid regurgitant velocity is 2.92 m/s, and with an assumed right atrial pressure of 15 mmHg, the estimated right ventricular  systolic pressure is 123456 mmHg. Left Atrium: Left atrial size was mildly dilated. Right Atrium: Right atrial size was moderately dilated. Pericardium: There is no evidence of pericardial effusion. Mitral Valve: The mitral valve is normal in structure. Trivial mitral valve regurgitation. No evidence of mitral valve stenosis. Tricuspid Valve: The tricuspid valve is normal in structure. Tricuspid valve regurgitation is mild. Aortic Valve: The aortic valve is tricuspid. Aortic valve regurgitation is trivial. Mild to moderate aortic valve sclerosis/calcification is present, without any evidence of aortic stenosis. Pulmonic Valve: The pulmonic valve was normal in structure. Pulmonic valve regurgitation is not visualized. Aorta: The aortic root is normal in size and structure. Venous: The inferior vena cava is dilated in size with less than 50% respiratory variability, suggesting right atrial pressure of 15 mmHg. IAS/Shunts: No atrial level shunt detected by color flow Doppler.  LEFT VENTRICLE PLAX 2D LVOT diam:     2.30 cm LV SV:         23 LV SV Index:   14 LVOT Area:     4.15 cm  RIGHT VENTRICLE TAPSE (M-mode): 0.7 cm LEFT ATRIUM             Index       RIGHT ATRIUM           Index LA Vol (A2C):   58.1 ml 34.71 ml/m RA Area:     26.80 cm LA Vol (A4C):   64.6 ml 38.59 ml/m RA Volume:   102.00 ml 60.94 ml/m LA Biplane Vol: 65.3 ml 39.01 ml/m  AORTIC VALVE LVOT Vmax:   45.00 cm/s LVOT Vmean:  30.600 cm/s LVOT VTI:    0.055 m  AORTA Ao Root diam: 3.20 cm Ao Asc diam:  3.60 cm MR Peak grad: 24.0 mmHg   TRICUSPID VALVE MR Vmax:      245.00 cm/s TR Peak grad:   34.1 mmHg                           TR Vmax:        292.00 cm/s                            SHUNTS                           Systemic VTI:  0.05 m                           Systemic Diam: 2.30 cm Loralie Champagne MD Electronically signed by Loralie Champagne MD Signature Date/Time: 06/08/2020/5:23:32 PM    Final    US THORACENTESIS ASP PLEURAL SPACE W/IMG GUIDE  Result  Date: 06/12/2020 INDICATION: Patient with history of congestive heart failure, bilateral pleural  effusions. Request is made for diagnostic and therapeutic thoracentesis EXAM: ULTRASOUND GUIDED DIAGNOSTIC AND THERAPEUTIC RIGHT THORACENTESIS MEDICATIONS: 10 mL 1% lidocaine COMPLICATIONS: None immediate. PROCEDURE: An ultrasound guided thoracentesis was thoroughly discussed with the patient and questions answered. The benefits, risks, alternatives and complications were also discussed. The patient understands and wishes to proceed with the procedure. Written consent was obtained. Ultrasound was performed to localize and mark an adequate pocket of fluid in the right chest. The area was then prepped and draped in the normal sterile fashion. 1% Lidocaine was used for local anesthesia. Under ultrasound guidance a 6 Fr Safe-T-Centesis catheter was introduced. Thoracentesis was performed. The catheter was removed and a dressing applied. FINDINGS: A total of approximately 1.0 liters of yellow fluid was removed. Samples were sent to the laboratory as requested by the clinical team. IMPRESSION: Successful ultrasound guided diagnostic and therapeutic right thoracentesis yielding 1.0 liters of pleural fluid. Read by: Brynda Greathouse PA-C Electronically Signed   By: Jerilynn Mages.  Shick M.D.   On: 06/12/2020 13:49    Microbiology: Recent Results (from the past 240 hour(s))  Resp Panel by RT-PCR (Flu A&B, Covid) Nasopharyngeal Swab     Status: None   Collection Time: 06/06/2020  3:29 AM   Specimen: Nasopharyngeal Swab; Nasopharyngeal(NP) swabs in vial transport medium  Result Value Ref Range Status   SARS Coronavirus 2 by RT PCR NEGATIVE NEGATIVE Final    Comment: (NOTE) SARS-CoV-2 target nucleic acids are NOT DETECTED.  The SARS-CoV-2 RNA is generally detectable in upper respiratory specimens during the acute phase of infection. The lowest concentration of SARS-CoV-2 viral copies this assay can detect is 138 copies/mL. A negative  result does not preclude SARS-Cov-2 infection and should not be used as the sole basis for treatment or other patient management decisions. A negative result may occur with  improper specimen collection/handling, submission of specimen other than nasopharyngeal swab, presence of viral mutation(s) within the areas targeted by this assay, and inadequate number of viral copies(<138 copies/mL). A negative result must be combined with clinical observations, patient history, and epidemiological information. The expected result is Negative.  Fact Sheet for Patients:  EntrepreneurPulse.com.au  Fact Sheet for Healthcare Providers:  IncredibleEmployment.be  This test is no t yet approved or cleared by the [place] FDA and  has been authorized for detection and/or diagnosis of SARS-CoV-2 by FDA under an Emergency Use Authorization (EUA). This EUA will remain  in effect (meaning this test can be used) for the duration of the COVID-19 declaration under Section 564(b)(1) of the Act, 21 U.S.C.section 360bbb-3(b)(1), unless the authorization is terminated  or revoked sooner.       Influenza A by PCR NEGATIVE NEGATIVE Final   Influenza B by PCR NEGATIVE NEGATIVE Final    Comment: (NOTE) The Xpert Xpress SARS-CoV-2/FLU/RSV plus assay is intended as an aid in the diagnosis of influenza from Nasopharyngeal swab specimens and should not be used as a sole basis for treatment. Nasal washings and aspirates are unacceptable for Xpert Xpress SARS-CoV-2/FLU/RSV testing.  Fact Sheet for Patients: EntrepreneurPulse.com.au  Fact Sheet for Healthcare Providers: IncredibleEmployment.be  This test is not yet approved or cleared by the [place] FDA and has been authorized for detection and/or diagnosis of SARS-CoV-2 by FDA under an Emergency Use Authorization (EUA). This EUA will remain in effect (meaning this test can be used)  for the duration of the COVID-19 declaration under Section 564(b)(1) of the Act, 21 U.S.C. section 360bbb-3(b)(1), unless the authorization is terminated or revoked.  Performed at [facility], [place] [address] [place], [place] [address]   Gram stain     Status: None   Collection Time: 06/12/20  1:35 PM   Specimen: PATH Cytology Pleural fluid  Result Value Ref Range Status   Specimen Description PLEURAL  Final   Special Requests NONE  Final   Gram Stain   Final    WBC PRESENT,BOTH PMN AND MONONUCLEAR NO ORGANISMS SEEN CYTOSPIN SMEAR Performed at [facility], [address] [place], [place] [address]    Report Status 07-01-2020 FINAL  Final     Labs: Basic Metabolic Panel: Recent Labs  Lab 06/17/2020 0329 06/11/2020 0554 06/11/20 0230 06/12/20 0223 06/12/20 1623 07/01/20 0241  NA 143 143 142 141 141 138  K 5.1 5.1 5.1 5.4* 5.3* 5.2*  CL 106 106 103 105 105 103  CO2 25  --  27 26 26 26   GLUCOSE 124* 105* 123* 108* 131* 102*  BUN 27* 40* 34* 43* 48* 53*  CREATININE 2.52* 2.40* 2.74* 2.65* 2.65* 2.57*  CALCIUM 9.7  --  9.5 9.4 9.3 9.3  MG  --   --  2.3  --   --   --    Liver Function Tests: Recent Labs  Lab 06/11/2020 0329  AST 83*  ALT 31  ALKPHOS 75  BILITOT 1.4*  PROT 6.6  ALBUMIN 3.3*   No results for input(s): LIPASE, AMYLASE in the last 168 hours. No results for input(s): AMMONIA in the last 168 hours. CBC: Recent Labs  Lab 06/12/2020 0329 06/30/2020 0554 06/11/20 0230 06/12/20 0223 07/01/2020 0241  WBC 8.8  --  11.7* 10.6* 9.0  NEUTROABS  --   --  8.7*  --   --   HGB 11.5* 13.3 11.3* 11.3* 10.9*  HCT 38.0 39.0 37.4 38.0 39.3  MCV 86.0  --  85.4 87.0 87.7  PLT 275  --  244 232 218   Cardiac Enzymes: No results for input(s): CKTOTAL, CKMB, CKMBINDEX, TROPONINI in the last 168 hours. D-Dimer No results for input(s): DDIMER in the last 72 hours. BNP: Invalid input(s): POCBNP CBG: Recent Labs  Lab 07-01-20 0742 07-01-20 1205  2020/07/01 1720  GLUCAP 72 132* 105*   Anemia work up No results for input(s): VITAMINB12, FOLATE, FERRITIN, TIBC, IRON, RETICCTPCT in the last 72 hours. Urinalysis    Component Value Date/Time   COLORURINE YELLOW 12/14/2017 0757   APPEARANCEUR CLEAR 12/14/2017 0757   LABSPEC 1.020 04/25/2018 0925   PHURINE 6.5 04/25/2018 0925   GLUCOSEU NEGATIVE 04/25/2018 0925   HGBUR NEGATIVE 04/25/2018 0925   BILIRUBINUR NEGATIVE 04/25/2018 0925   KETONESUR NEGATIVE 04/25/2018 0925   PROTEINUR 30 (A) 04/25/2018 0925   UROBILINOGEN 0.2 04/25/2018 0925   NITRITE NEGATIVE 04/25/2018 0925   LEUKOCYTESUR TRACE (A) 04/25/2018 0925   Sepsis Labs Invalid input(s): PROCALCITONIN,  WBC,  LACTICIDVEN     SIGNED:  Kayleen Memos, MD  [facility] 06/14/2020, 4:41 PM Pager   If 7PM-7AM, please contact night-coverage [email or web address] Password [email or web address]

## 2020-06-17 ENCOUNTER — Ambulatory Visit: Payer: Medicare Other | Admitting: Physician Assistant

## 2020-06-20 LAB — CYTOLOGY - NON PAP

## 2020-07-05 NOTE — TOC Progression Note (Addendum)
Transition of Care [facility]) - Progression Note    Patient Details  Name: KADEN DUNKEL MRN: [id number] Date of Birth: [birth year]  [facility] of [facility] CM/SW [place], [place] Phone Number: 2020/07/12, 11:18 AM  Clinical Narrative:      Update 1/10 1:10pm- CSW spoke with patients son [name] and provided SNF bed offers. Patients son wants to look over offers and call CSW back with SNF choice.Patient son confirmed he will call CSW back in the morning with SNF choice.  Pending SNF choice. CSW will continue to follow. CSW called patients son [name] to provide SNF bed offers. No answer. CSW let voicemail and awaiting call back. CSW made referral to [facility] and spoke with Chrislynn for outpatient palliative services to follow patient at SNF.  CSW will continue to follow.    Expected Discharge Plan: Skilled Nursing Facility Barriers to Discharge: SNF Pending bed offer,Continued Medical Work up  Expected Discharge Plan and Services Expected Discharge Plan: [place]       Living arrangements for the past 2 months:  (Condo)                                       Social Determinants of Health (SDOH) Interventions    Readmission Risk Interventions Readmission Risk Prevention Plan 01/14/2020  Transportation Screening Complete  PCP or Specialist Appt within 5-7 Days Complete  Home Care Screening Complete  Medication Review (RN CM) Complete  Some recent data might be hidden

## 2020-07-05 NOTE — Progress Notes (Signed)
Pt refused to be on BiPAP irrespective of her  Venous pO2 being low. Respiratory and RN educated pt on the importance of wearing BiPAP.

## 2020-07-05 NOTE — Progress Notes (Signed)
Daily Progress Note   Patient Name: Shelly Obrien       Date: 2020-06-17 DOB: 03/04/1923  Age: 87 y.o. MRN#: [id number] Attending Physician: Kayleen Memos, DO Primary Care Physician: [name], MD Admit Date: 06/12/2020  Reason for Consultation/Follow-up: Disposition and Establishing goals of care  Subjective: Chart review performed. Received report from primary RN - no acute concerns. RN states patient is eating and drinking well, is alert but drowsy today, refuses BiPap but is currently stable on [place].  Went to visit patient at bedside - no family/visitors present. Patient was lying in bed asleep - she did wake to voice/gentle touch. Patient was alert, oriented, and able to participate in conversation. She was on 2L . No signs or non-verbal gestures of pain or discomfort noted. No respiratory distress, increased work of breathing, or secretions noted. Patient denied pain and states her shortness of breath has improved.   I discussed discharge options with patient - at this time she is motivated to work with PT and go to rehab on discharge. She states she wants to do "anything that will help me" and she feels "like I want to get better." She does understand she is at high risk of rehospitalization. After discussion, she explains that her goal is to not be rehospitalized, but move to comfort care at that time with hospice.   Introduced, reviewed, and completed MOST form as outlined under Recommendation section below.   All questions and concerns addressed. Encouraged to call with questions and/or concerns. PMT card provided.  3:46 PM Called son/Bobby to provide updates and discuss disposition options. Spoke with [name] and his son Ursula Alert. via phone. Reviewed with them information and  discussion had with patient today. Reviewed that patient is stable, but overall not improving. Discharge options reviewed were: discharge with hospice without rehab, discharge to SNF rehab with outpatient PC - recommended if it went well to continue services and discharge with hospice from rehab. If patient declined during rehab or was not finding benefit with services, recommended inform outpatient PC to transition patient to hospice at that time. Education provided on outpatient PC services. Discussed with family that the patient's goal was to not be rehospitalized. Reviewed home hospice vs residential hospice. Family had concerns about caring for patient at EOL at home. Discussed home hospice  with home health aid support to supplement services, LTC facility with hospice, or residential hospice if patient had a life expectancy of two weeks or less. Answered all questions and encouraged family to speak with LCSW tomorrow with insurance questions as they already have a meeting scheduled. Family seems to feel discharging to rehab with outpatient PC and then transition to hospice is their first choice. Family states they have a meeting set up with attending tomorrow 1/11 at 0830 and would like to speak with attending before making final decisions, which is reasonable.  All questions and concerns addressed. Encouraged to call with questions and/or concerns. PMT card provided.   Length of Stay: 3  Current Medications: Scheduled Meds:  . amiodarone  200 mg Oral BID  . apixaban  2.5 mg Oral BID  . furosemide  20 mg Intravenous BID  . insulin aspart  0-6 Units Subcutaneous TID WC  . mouth rinse  15 mL Mouth Rinse BID  . sodium chloride flush  3 mL Intravenous Q12H    Continuous Infusions: . sodium chloride      PRN Meds: sodium chloride, acetaminophen, ondansetron (ZOFRAN) IV, polyvinyl alcohol, sodium chloride flush  Physical Exam Vitals and nursing note reviewed.  Constitutional:      General:  She is not in acute distress.    Appearance: She is ill-appearing.  Pulmonary:     Effort: No respiratory distress.  Skin:    General: Skin is warm and dry.  Neurological:     Mental Status: She is alert and oriented to person, place, and time.     Motor: Weakness present.  Psychiatric:        Attention and Perception: Attention normal.        Behavior: Behavior is cooperative.        Cognition and Memory: Cognition and memory normal.             Vital Signs: BP 97/72 (BP Location: Right Arm)   Pulse 88   Temp 97.6 F (36.4 C) (Oral)   Resp 19   Ht 5\' 5"  (1.651 m)   Wt 69.8 kg   SpO2 97%   BMI 25.59 kg/m  SpO2: SpO2: 97 % O2 Device: O2 Device: Nasal Cannula O2 Flow Rate: O2 Flow Rate (L/min): 2 L/min  Intake/output summary:   Intake/Output Summary (Last 24 hours) at June 15, 2020 1509 Last data filed at June 15, 2020 9767 Gross per 24 hour  Intake 123 ml  Output 225 ml  Net -102 ml   LBM: Last BM Date: 06/30/2020 Baseline Weight: Weight: 61.2 kg Most recent weight: Weight: 69.8 kg       Palliative Assessment/Data: PPS 30%      Patient Active Problem List   Diagnosis Date Noted  . Acute congestive heart failure (HCC)   . Acute MI (HCC) 06/20/2020  . Prediabetes 06/04/2020  . Normocytic anemia 06/14/2020  . Acute on chronic systolic CHF (congestive heart failure) (HCC) 06/08/2020  . Elevated troponin   . LV dysfunction   . Permanent atrial fibrillation (HCC)   . Surgery, elective   . Hip fracture (HCC) 01/12/2020  . Displaced fracture of right femoral neck (HCC) 01/12/2020  . Fall 01/12/2020  . Closed nondisplaced intertrochanteric fracture of right femur (HCC)   . Severe sepsis (HCC)   . Cellulitis of right leg 10/30/2019  . Hyperkalemia 10/30/2019  . SIRS (systemic inflammatory response syndrome) (HCC) 10/30/2019  . MGUS (monoclonal gammopathy of unknown significance) 10/22/2019  . Cellulitis 10/17/2019  . Acute  kidney injury superimposed on chronic kidney  disease ([place]) 10/17/2019  . DNR (do not resuscitate) 10/17/2019  . Longstanding persistent atrial fibrillation ([place])   . Cardiomyopathy ([place]) 07/14/2019  . Systolic CHF ([place]) 0000000  . Peripheral neuropathy 08/12/2018  . B12 deficiency 08/12/2018  . Constipation   . Acute on chronic cholecystitis s/p lap cholecystectomy 12/06/2017 12/04/2017  . Nephrolithiasis 12/04/2017  . Abdominal pain 12/04/2017  . Spinal stenosis in cervical region 04/24/2017  . Spinal stenosis of lumbar region 04/24/2017  . Dizziness 04/24/2017  . Ureteral stone with hydronephrosis 01/20/2017  . Atrial fibrillation with RVR ([place]) 02/24/2016  . CKD (chronic kidney disease) stage 3, GFR 30-59 ml/min (HCC) 02/24/2016  . Exudative macular degeneration ([place]) 02/24/2016  . CHOLELITHIASIS 02/14/2009  . Hyperlipidemia 12/29/2008  . Essential hypertension 12/29/2008  . MYOCARDIAL INFARCTION, HX OF 12/29/2008  . CAD (coronary artery disease) 12/29/2008    Palliative Care Assessment & Plan   Patient Profile: 87 y.o.femalewith past medical history of Atrial fibrillation on Eliquis, chronic systolic CHF (last EF [id number]), CKD stage III, hypertension, hyperlipidemia, and CAD s/p PCI who presented to the emergency departmenton 01/02/2022with chest pain that had awakened her from sleep.Associated symptoms included shortness of breath worsening over the past week, non-productive cough, fatigue, and LE swelling.  ED Course: EKG showing signs of ST elevation in V4-5 so code STEMI was called and later cancelled by cardiology. Chest x-ray showed diffuse interstitial pulmonary infiltrates and bilateral pleural effusions concerning for heart failure. Creatinine 2.52. Patient admitted to [facility] for management of acute MI, acute on chronic systolic CHF, and AKI.  Assessment: Acute MI Acute respiratory failure secondary to pulmonary edema and bilateral plural effusions Acute on chronic CHF AKI on CKD Physical debility Atrial  fibrillation  Recommendations/Plan:  Continue current medical treatment  Continue DNR/DNI as previously documented (durable DNR already on shadow chart)  Patient/family seem to prefer discharge to rehab with outpatient Palliative Care to follow, then transition to hospice post-rehab or if patient declines  Patient's goal is to not be rehospitalized once discharged  Family has a meeting scheduled with attending tomorrow 1/11 at 0830, they state they will make final disposition decisions after that meeting  MOST form completed at follows: DNR/DNI, Comfort Measures, Determine use or limitation of antibiotics when infection occurs, No IV fluids, No feeding tube. Original placed in shadow chart, copy was made and will be scanned into Vyn[facility].  PMT will continue to follow holistically  Goals of Care and Additional Recommendations:  Limitations on Scope of Treatment: Full Scope Treatment  Code Status:    Code Status Orders  (From admission, onward)         Start     Ordered   06/28/2020 0719  Do not attempt resuscitation (DNR)  Continuous       Question Answer Comment  In the event of cardiac or respiratory ARREST Do not call a "code blue"   In the event of cardiac or respiratory ARREST Do not perform Intubation, CPR, defibrillation or ACLS   In the event of cardiac or respiratory ARREST Use medication by any route, position, wound care, and other measures to relive pain and suffering. May use oxygen, suction and manual treatment of airway obstruction as needed for comfort.      06/26/2020 0723        Code Status History    Date Active Date Inactive Code Status Order ID Comments User Context   01/12/2020 0407 01/17/2020 1905 DNR [id number]  Gean Birchwood  Delane Ginger, MD ED   10/29/2019 1422 11/03/2019 2148 DNR [id number]  Terrilee Croak, MD ED   10/29/2019 1130 10/29/2019 1421 DNR [id number]  Terrilee Croak, MD ED   10/17/2019 1646 10/20/2019 2014 DNR [id number]  Norval Morton, MD ED   10/17/2019  1528 10/17/2019 1646 Full Code [id number]  Norval Morton, MD ED   07/12/2019 1541 07/14/2019 1730 Full Code [id number]  Theora Gianotti, NP Inpatient   12/15/2017 1250 12/15/2017 1911 Partial Code [id number]  Florencia Reasons, MD Inpatient   12/14/2017 1528 12/15/2017 1250 Full Code [id number]  Lady Deutscher, MD ED   12/10/2017 2343 12/11/2017 1630 Full Code [id number]  Nils Flack, RN Inpatient   12/04/2017 0456 12/10/2017 2343 Full Code [id number]  Norval Morton, MD ED   01/20/2017 0720 01/21/2017 1432 Full Code [id number]  Alexis Frock, MD ED   02/24/2016 1412 02/27/2016 1900 Partial Code [id number]  Willia Craze, NP ED   Advance Care Planning Activity       Prognosis:   < 6 months  Discharge Planning:  [place] [facility] for rehab with Palliative care service follow-up  Care plan was discussed with primary RN, patient, patient's son and grandson  Thank you for allowing the Palliative Medicine Team to assist in the care of this patient.   Total Time 65 minutes Prolonged Time Billed  yes       Greater than 50%  of this time was spent counseling and coordinating care related to the above assessment and plan.  Lin Landsman, NP  Please contact Palliative Medicine Team phone at [phone] for questions and concerns.

## 2020-07-05 NOTE — Progress Notes (Signed)
CRITICAL VALUE ALERT  Critical Value:  PO2 28.8  Date & Time Notied: 10 January 22 at 734  Provider Notified:Dr Irene Pap  Orders Received/Actions taken: Awaiting

## 2020-07-05 NOTE — Progress Notes (Signed)
Pt in agonal breathing. Son notified and he is on his way. Pt pronounced dead at 09-24-23 by two RNs. MD notified as well.

## 2020-07-05 NOTE — Progress Notes (Signed)
Physical Therapy Treatment Patient Details Name: Shelly Obrien MRN: [id number] DOB: 29-Jun-1922 Today's Date: 06/24/2020    History of Present Illness 87 y.o. female with medical history significant of HTN, HLD, CAD S/P PCI in 18', A. fib on Eliquis, systolic CHF, last EF 09-32%, and CKD stage III resident with complaints of chest pain.  Associated symptoms included shortness of breath which has worsened over the last week, nonproductive cough, fatigue, lower extremity swelling, and weight loss with a few pounds.  She had suffered a right femur fracture back in August 2021 treated with intramedullary fixation.    PT Comments    Pt was seen for mobility today and note her decline from last PT visit.  Pt is unable to walk and very unsteady to SPT with a few steps.  PT had to assist her directly for safety and pt was unable to balance in sitting once in chair without propping pillows.  Pt is sitting with her head down, R side is 3 to 3+ strength and R is 3+ to 4+.  Not sure of reason for change but MD was made aware.  Follow up with her work on LE strength and sitting balance to increase safety with gait.   Follow Up Recommendations  SNF;Supervision for mobility/OOB     Equipment Recommendations  None recommended by PT    Recommendations for Other Services       Precautions / Restrictions Precautions Precautions: Fall Precaution Comments: monitor sats and HR Restrictions Weight Bearing Restrictions: No    Mobility  Bed Mobility Overal bed mobility: Needs Assistance Bed Mobility: Supine to Sit     Supine to sit: Mod assist     General bed mobility comments: mod assist to get to side of bed  Transfers Overall transfer level: Needs assistance Equipment used: Rolling walker (2 wheeled);1 person hand held assist Transfers: Sit to/from [facility] Sit to Stand: Mod assist Stand pivot transfers: Mod assist       General transfer comment: listing to R side with  sitting postures, unable to lift and move RLE well  Ambulation/Gait             General Gait Details: unsafe to attempt   Stairs             Wheelchair Mobility    Modified Rankin (Stroke Patients Only)       Balance Overall balance assessment: Needs assistance Sitting-balance support: No upper extremity supported Sitting balance-Leahy Scale: Poor Sitting balance - Comments: poor today with pt leaning to R side despite postural corrections Postural control: Right lateral lean   Standing balance-Leahy Scale: Poor                              Cognition Arousal/Alertness: Lethargic Behavior During Therapy: Impulsive;Flat affect Overall Cognitive Status: No family/caregiver present to determine baseline cognitive functioning                                 General Comments: has less active mobility tolerance today and unsure if she is more lethargic today      Exercises      General Comments General comments (skin integrity, edema, etc.): Pt was assisted to stand and side step to chair with pillows used to support her posture.  Nursing was informed that pt was much weaker than last PT visit.  Pertinent Vitals/Pain Pain Assessment: No/denies pain    Home Living                      Prior Function            PT Goals (current goals can now be found in the care plan section) Acute Rehab PT Goals Patient Stated Goal: to get moving Progress towards PT goals: Not progressing toward goals - comment    Frequency    Min 3X/week      PT Plan Discharge plan needs to be updated    Co-evaluation              AM-PAC PT "6 Clicks" Mobility   Outcome Measure  Help needed turning from your back to your side while in a flat bed without using bedrails?: A Little   Help needed moving to and from a bed to a chair (including a wheelchair)?: A Lot Help needed standing up from a chair using your arms (e.g.,  wheelchair or bedside chair)?: A Lot Help needed to walk in hospital room?: A Lot Help needed climbing 3-5 steps with a railing? : Total 6 Click Score: 10    End of Session Equipment Utilized During Treatment: Gait belt;Oxygen Activity Tolerance: Patient limited by fatigue;Treatment limited secondary to medical complications (Comment) Patient left: in chair;with call bell/phone within reach;with bed alarm set Nurse Communication: Mobility status PT Visit Diagnosis: Other abnormalities of gait and mobility (R26.89);Muscle weakness (generalized) (M62.81);Unsteadiness on feet (R26.81)     Time: 7510-2585 PT Time Calculation (min) (ACUTE ONLY): 20 min  Charges:  $Therapeutic Activity: 8-22 mins                    Ramond Dial Jun 16, 2020, 3:35 PM  Mee Hives, PT MS Acute Rehab Dept. Number: [place] and [place]

## 2020-07-05 NOTE — Progress Notes (Addendum)
Progress Note  Patient Name: Shelly Obrien Date of Encounter: 07-13-20  Primary Cardiologist: Mertie Moores, MD  Subjective   States she's feeling better today. No CP. Less SOB.  Inpatient Medications    Scheduled Meds: . amiodarone  200 mg Oral BID  . apixaban  2.5 mg Oral BID  . insulin aspart  0-6 Units Subcutaneous TID WC  . mouth rinse  15 mL Mouth Rinse BID  . metoprolol succinate  25 mg Oral BID WC  . sodium chloride flush  3 mL Intravenous Q12H   Continuous Infusions: . sodium chloride     PRN Meds: sodium chloride, acetaminophen, ondansetron (ZOFRAN) IV, polyvinyl alcohol, sodium chloride flush   Vital Signs    Vitals:   06/12/20 2312 Jul 13, 2020 0055 2020/07/13 0125 Jul 13, 2020 0423  BP:  100/67 96/65 97/72   Pulse: 68 66 70 70  Resp: 14 (!) 22 15 14   Temp:    97.6 F (36.4 C)  TempSrc:    Oral  SpO2: 98% 96% 96% 96%  Weight:      Height:        Intake/Output Summary (Last 24 hours) at 13-Jul-2020 0838 Last data filed at 07/13/20 0445 Gross per 24 hour  Intake 3 ml  Output 225 ml  Net -222 ml   Last 3 Weights 06/12/2020 06/24/2020 06/21/2020  Weight (lbs) 153 lb 12.8 oz 154 lb 5.2 oz 135 lb  Weight (kg) 69.763 kg 70 kg 61.236 kg     Telemetry    Atrial fib, rate controlled - Personally Reviewed  Physical Exam   GEN: No acute distress, chronically ill appearing/fragile HEENT: Normocephalic, atraumatic, sclera non-icteric. Neck: No JVD or bruits. Cardiac: irregularly irregular no murmurs, rubs, or gallops.  Radials/DP/PT 1+ and equal bilaterally.  Respiratory: diffuse crackles at bases. No wheezing or rhonchi. Breathing is unlabored. GI: Soft, nontender, non-distended, BS +x 4. MS: no deformity. Extremities: No clubbing or cyanosis. Trace BLE edema. Distal pedal pulses are 2+ and equal bilaterally. Neuro:  AAOx3. Follows commands. Psych:  Responds to questions appropriately with a normal affect.  Labs    High Sensitivity Troponin:   Recent Labs   Lab 06/20/2020 0329 06/23/2020 0535  TROPONINIHS 12,241* 12,017*      Cardiac EnzymesNo results for input(s): TROPONINI in the last 168 hours. No results for input(s): TROPIPOC in the last 168 hours.   Chemistry Recent Labs  Lab 06/18/2020 0329 06/09/2020 0554 06/12/20 0223 06/12/20 1623 13-Jul-2020 0241  NA 143   < > 141 141 138  K 5.1   < > 5.4* 5.3* 5.2*  CL 106   < > 105 105 103  CO2 25   < > 26 26 26   GLUCOSE 124*   < > 108* 131* 102*  BUN 27*   < > 43* 48* 53*  CREATININE 2.52*   < > 2.65* 2.65* 2.57*  CALCIUM 9.7   < > 9.4 9.3 9.3  PROT 6.6  --   --   --   --   ALBUMIN 3.3*  --   --   --   --   AST 83*  --   --   --   --   ALT 31  --   --   --   --   ALKPHOS 75  --   --   --   --   BILITOT 1.4*  --   --   --   --   GFRNONAA 17*   < >  16* 16* 17*  ANIONGAP 12   < > 10 10 9    < > = values in this interval not displayed.     Hematology Recent Labs  Lab 06/11/20 0230 06/12/20 0223 June 26, 2020 0241  WBC 11.7* 10.6* 9.0  RBC 4.38 4.37 4.48  HGB 11.3* 11.3* 10.9*  HCT 37.4 38.0 39.3  MCV 85.4 87.0 87.7  MCH 25.8* 25.9* 24.3*  MCHC 30.2 29.7* 27.7*  RDW 20.6* 20.3* 20.1*  PLT 244 232 218    BNP Recent Labs  Lab 06/07/2020 0329  BNP 2,431.5*     DDimer No results for input(s): DDIMER in the last 168 hours.   Radiology    DG Chest 1 View  Result Date: 06/12/2020 CLINICAL DATA:  Status post right thoracentesis. Congestive heart failure. EXAM: CHEST  1 VIEW COMPARISON:  06/12/2020 FINDINGS: Small to moderate right pleural effusion appears similar to previous study. Moderate left pleural effusion is increased in size. No pneumothorax visualized. Cardiomegaly and diffuse interstitial infiltrates appear stable. Bibasilar compressive atelectasis again noted due to the pleural effusions. IMPRESSION: No significant change in small to moderate right pleural effusion. No pneumothorax visualized. Increased moderate left pleural effusion. Stable cardiomegaly and diffuse  interstitial infiltrates consistent with edema. Electronically Signed   By: Marlaine Hind M.D.   On: 06/12/2020 13:39   US THORACENTESIS ASP PLEURAL SPACE W/IMG GUIDE  Result Date: 06/12/2020 INDICATION: Patient with history of congestive heart failure, bilateral pleural effusions. Request is made for diagnostic and therapeutic thoracentesis EXAM: ULTRASOUND GUIDED DIAGNOSTIC AND THERAPEUTIC RIGHT THORACENTESIS MEDICATIONS: 10 mL 1% lidocaine COMPLICATIONS: None immediate. PROCEDURE: An ultrasound guided thoracentesis was thoroughly discussed with the patient and questions answered. The benefits, risks, alternatives and complications were also discussed. The patient understands and wishes to proceed with the procedure. Written consent was obtained. Ultrasound was performed to localize and mark an adequate pocket of fluid in the right chest. The area was then prepped and draped in the normal sterile fashion. 1% Lidocaine was used for local anesthesia. Under ultrasound guidance a 6 Fr Safe-T-Centesis catheter was introduced. Thoracentesis was performed. The catheter was removed and a dressing applied. FINDINGS: A total of approximately 1.0 liters of yellow fluid was removed. Samples were sent to the laboratory as requested by the clinical team. IMPRESSION: Successful ultrasound guided diagnostic and therapeutic right thoracentesis yielding 1.0 liters of pleural fluid. Read by: Brynda Greathouse PA-C Electronically Signed   By: Jerilynn Mages.  Shick M.D.   On: 06/12/2020 13:49    Cardiac Studies   2D echo 06/11/19 1. Left ventricular ejection fraction, by estimation, is <20%. The left  ventricle has severely decreased function. The left ventricle demonstrates  global hypokinesis. The left ventricular internal cavity size was mildly  dilated. Left ventricular  diastolic parameters are indeterminate.  2. Right ventricular systolic function is moderately reduced. The right  ventricular size is mildly enlarged. There is  moderately elevated  pulmonary artery systolic pressure. The estimated right ventricular  systolic pressure is 99.8 mmHg.  3. Left atrial size was mildly dilated.  4. Right atrial size was moderately dilated.  5. The mitral valve is normal in structure. Trivial mitral valve  regurgitation. No evidence of mitral stenosis.  6. The aortic valve is tricuspid. Aortic valve regurgitation is trivial.  Mild to moderate aortic valve sclerosis/calcification is present, without  any evidence of aortic stenosis.  7. The inferior vena cava is dilated in size with <50% respiratory  variability, suggesting right atrial pressure of 15 mmHg.  Patient Profile     87 y.o. female CAD and remote h/o MI/PCI, chronic atrial fibrillation on Eliquis, ICM (baseline EF was 25-30% in 07/2019), CKD stage III, MGUS, recent hip fracture in August of 2021 s/p intramedullary fixation on 01-13-20, HTN who presented to [facility] with chest pain radiating to her back, found to have acute MI, acute hypoxic and hypercarbic respiratory failure secondary to pulmonary edema and bilateral pleural effusions, acute on chronic systolic CHF, AKI on CKD stage III and hyperkalemia. Underwent thoracentesis 06/12/20.  Assessment & Plan    1. CAD with acute MI - NSTEMI noted, but question of plaque rupture / true STEMI given degree of troponin elevation to peak 12241 - managed conservatively given advanced age and kidney failure - no Plavix given bleeding risk with concomitant Eliquis, anemia, kidney failure  - hold BB as below - do not feel statin would specifically impact outcomes - see #2 re: palliative rx  2. Acute hypoxic and hypercarbic respiratory failure likely due in part to acute on chronic systolic CHF - received IV Lasix earlier this admission but BP will not support and kidney failure makes problematic - s/p thoracentesis yesterday with 1L removal - stopped prior to entire fluid remoal due to hypotension - cannot use GDMT due  to hypotension - poor candidate for advanced therapies given advanced age, renal failure - hospice seems most appropriate - palliative care following  3. AKI superimposed on CKD stage III with hyperkalemia - prior baseline around 1.4, maintaining in the 2.5-2.6 range this admission - potassium remains problematic - rx per IM  4. Chronic atrial fib - on Eliquis and amiodarone prior to admission, briefly on heparin, now back on Eliquis - amiodarone previously used for rate control rather than rhythm control - metoprolol changed to succinate this admission but intermittently not receiving due to hypotension - will hold further for now given acute HF - rate seems OK on amiodarone   For questions or updates, please contact [facility] Please consult [email or web address] for contact info under Cardiology/STEMI.  Signed, Charlie Pitter, PA-C 01-Jul-2020, 8:38 AM    Attending Note:   The patient was seen and examined.  Agree with assessment and plan as noted above.  Changes made to the above note as needed.  Patient seen and independently examined with Melina Copa, PA .   We discussed all aspects of the encounter. I agree with the assessment and plan as stated above.  1.  NSTEMI:   Limited in our treatments from her underlying medical issues.  BP is too low to add any further antianginal meds.  She is on Eliquis and has a hx of GI bleeding so I agree with not starting plavix   2.  CHF :  EF is < 20%.   BP is too low to add any medications for her CHF   [facility] will sign off.   Medication Recommendations:  Cont supportive care  Other recommendations (labs, testing, etc):   Follow up as an outpatient:   With hospice.     I have spent a total of 40 minutes with patient reviewing hospital  notes , telemetry, EKGs, labs and examining patient as well as establishing an assessment and plan that was discussed with the patient. > 50% of time was spent in direct patient care.    Thayer Headings, Brooke Bonito., MD, [facility] 07/01/2020, 10:18 AM [address],  [place] Pager [phone]

## 2020-07-05 NOTE — Progress Notes (Signed)
PROGRESS NOTE  Shelly Obrien:[id number] DOB: 20-Jun-1922 DOA: 2020/06/30 PCP: Kirby Funk, MD  HPI/Recap of past 24 hours:  Shelly Obrien is a 87 y.o. female with medical history significant of HTN, HLD, right femur fracture repair in August 2021, CAD S/P PCI in 103',  chronic A. fib on Eliquis, systolic CHF 25 to 30%, and CKD stage III who presents with complaints of chest pain, pressure-like centrally with radiation to the back waking her out of her sleep the morning of her presentation.  At baseline her mentation is intact, she lives alone and gets around with the use of a cane and a wheelchair.    Associated symptoms included shortness of breath which has worsened over the last week, nonproductive cough, fatigue, lower extremity swelling.  She has been taking all of her medications as prescribed and has not missed any doses.  Sitting up this morning helped a little bit with her shortness of breath symptoms.  In route with EMS patient was given 1 sublingual nitroglycerin and 324 mg of aspirin.  EMS reported that blood pressure dropped 40 points with nitroglycerin from the 150's/90's down to the 110's/60's.  Patient was initially called in as a code STEMI, but was canceled by cardiology.  ED Course:  Labs significant for hemoglobin 11.5, BUN 27, creatinine 2.52, hemoglobin A1c 6, LDL58, high-sensitivity troponin 12,241-> 12,017.  Influenza and COVID-19 screening were negative.  Chest x-ray revealed diffuse interstitial pulmonary infiltrate and bilateral pleural effusions concerning for heart failure exacerbation.  Cardiology recommended medical management given age, acute kidney injury, and other comorbidities.  ABG done on 06/11/2020 revealed acute hypoxic and hypercarbic respiratory failure with pH of 7.2 and PCO2 of 55.  VBG done on 06/12/2020 showed pH of 7.2 with PCO2 of 63.  Ordered BiPAP however patient was not able to tolerate.  She has been seen by palliative care team, plan is to discharge  to SNF with palliative care and possibly later may require hospice.  She is a DNR.  06/07/2020: Patient was seen and examined at her bedside.  She declined BiPAP stating it makes her feel smothered.  Updated her son via phone.  Assessment/Plan: Principal Problem:   Acute MI (HCC) Active Problems:   CAD (coronary artery disease)   Longstanding persistent atrial fibrillation (HCC)   Acute kidney injury superimposed on chronic kidney disease (HCC)   DNR (do not resuscitate)   Prediabetes   Normocytic anemia   Acute on chronic systolic CHF (congestive heart failure) (HCC)   Acute congestive heart failure (HCC)  Acute NSTEMI with abnormal EKG She presented with chest pressure with radiation to her back. Twelve-lead EKG abnormal Presented with elevated troponin greater than 12,000. Received full dose aspirin and nitroglycerin in route via EMS She received 48 hours of heparin drip.  She received antiplatelet however they had to be held by cardiology due to history of GI bleed. Toprol-XL 25 mg twice daily has to be held due to soft blood pressures to avoid hypotension. She is currently on amiodarone 200 mg twice daily and Eliquis 2.5 mg twice daily. She has been intolerant of statin. Cardiology signed off on 06/12/2020 recommends outpatient hospice. 2D echo done on 06-30-20 shows LVEF less than 20%, left ventricle severely decreased function with global hypokinesis.  Right atrial size moderately dilated.  Acute hypoxic and hypercarbic respiratory failure secondary to pulmonary edema and bilateral pleural effusions right greater than left. Not on oxygen supplementation at baseline ABG done on 06/11/2020 showed pH  7.282, PCO2 55 VBG done on 06/12/2020 shows pH 7.254, PCO2 63 VBG on 06/26/2020 shows pH 7.299, PCO2 59.6, PO2 28.8 Patient has declined BiPAP, she understands the risks of not using the BiPAP and her current condition.  Her son Shelly Obrien has been updated via phone on 2020-06-26.  Pulmonary  edema/bilateral pleural effusion right greater than left, likely cardiogenic post right thoracentesis on 06/12/2020 by IR. She has received a dose of Lasix due to significant acute hypoxic and hypercarbic respiratory failure and to improve her symptomatology. Cardiology has recommended to hold diuretics initially.  Discussed with Dr. Acie Fredrickson on 06/26/2020 via phone, other than avoiding hypotension and worsening renal function, can restart diuretics, okay to use midodrine if needed to maintain MAP greater than 65. She is currently post right thoracentesis with 1.0 L of yellow fluid removed on 06/12/2020 by IR.  Follow fluid analysis. Incentive spirometer, flutter valve as able. Mobilize as able. Patient has declined BiPAP  Acute on chronic systolic CHF Presented with elevated BNP greater than 2400. Pulmonary edema and bilateral pleural effusions on chest x-ray, personally reviewed 2D echo with findings as stated above Continue strict I's and O's and daily weight. Cardiac specific medications on hold due to soft blood pressure to avoid hypotension.  AKI on CKD 3B Baseline creatinine appears to be 1.4 with GFR of 32 Presented with creatinine of 2.5 with GFR of 17 Creatinine is downtrending 2.57 from 2.74 with GFR 15 Monitor urine output and avoid nephrotoxins Avoid hypotension Daily renal panel  Hyperkalemia in the setting of renal insufficiency Serum potassium 5.4> 5.2 Resume IV Lasix 20 mg twice daily Repeat BMP this afternoon  Chronic A. Fib Prior to admission she was on amiodarone and Eliquis Eliquis was on hold while on heparin drip, completed 48 hours. Eliquis restarted on 06/12/2020 Continue home amiodarone Continue to follow blood pressure and heart rate.  Impaired glucose tolerance Hemoglobin A1c 6.0 on 06/17/2020 Very sensitive insulin sliding scale  Physical debility-ambulatory dysfunction PT recommended SNF Continue fall precautions TOC consulted to assist with SNF placement.   Appreciate assistance.  Goals of care Seen by palliative care team Patient has a very poor prognosis due to multiple comorbidities and advanced age. She has been seen by palliative care team, plan is to discharge to SNF with palliative care and possibly later may require hospice.  She is a DNR.   Code Status: DNR  Family Communication: Updated her son via phone on 06/12/2020 and 2020-06-26 via phone..  Disposition Plan: Likely will DC to SNF with palliative care once hemodynamically stable   Consultants:  Cardiology  Palliative care team.  Procedures:  2D echo  Antimicrobials:  None  DVT prophylaxis: Eliquis.  Status is: Inpatient    Dispo:  Patient From: Home  Planned Disposition: [facility]  Expected discharge date: 06/15/2020  Medically stable for discharge: No, ongoing management of NSTEMI, acute on chronic systolic CHF, acute hypoxic respiratory failure, pulmonary edema/bilateral pleural effusions.         Objective: Vitals:   06/26/20 0055 2020-06-26 0125 06/26/2020 0423 06/26/20 1100  BP: 100/67 96/65 97/72    Pulse: 66 70 70 88  Resp: (!) 22 15 14 19   Temp:   97.6 F (36.4 C)   TempSrc:   Oral   SpO2: 96% 96% 96% 97%  Weight:      Height:        Intake/Output Summary (Last 24 hours) at 06-26-20 1132 Last data filed at 2020-06-26 0924 Gross per 24 hour  Intake 123 ml  Output 225 ml  Net -102 ml   Filed Weights   06/09/2020 0326 06/28/2020 2224 06/12/20 0425  Weight: 61.2 kg 70 kg 69.8 kg    Exam:  . General: 87 y.o. year-old female pleasant well-developed well-nourished in no acute distress.  On nasal cannula.  Alert and oriented x4.   . Cardiovascular: Irregular rate and rhythm no rubs or gallops. [name] Respiratory: Diffuse rales bilaterally no wheezes noted.  Poor inspiratory effort. . Abdomen: Soft nontender normal bowel sounds present.  . Musculoskeletal: Trace lower extremity edema bilaterally.  [name] Psychiatry: Mood is  appropriate for condition and setting.   Data Reviewed: CBC: Recent Labs  Lab 06/09/2020 0329 06/30/2020 0554 06/11/20 0230 06/12/20 0223 07-12-2020 0241  WBC 8.8  --  11.7* 10.6* 9.0  NEUTROABS  --   --  8.7*  --   --   HGB 11.5* 13.3 11.3* 11.3* 10.9*  HCT 38.0 39.0 37.4 38.0 39.3  MCV 86.0  --  85.4 87.0 87.7  PLT 275  --  244 232 921   Basic Metabolic Panel: Recent Labs  Lab 06/09/2020 0329 06/20/2020 0554 06/11/20 0230 06/12/20 0223 06/12/20 1623 12-Jul-2020 0241  NA 143 143 142 141 141 138  K 5.1 5.1 5.1 5.4* 5.3* 5.2*  CL 106 106 103 105 105 103  CO2 25  --  27 26 26 26   GLUCOSE 124* 105* 123* 108* 131* 102*  BUN 27* 40* 34* 43* 48* 53*  CREATININE 2.52* 2.40* 2.74* 2.65* 2.65* 2.57*  CALCIUM 9.7  --  9.5 9.4 9.3 9.3  MG  --   --  2.3  --   --   --    GFR: Estimated Creatinine Clearance: 12.8 mL/min (A) (by C-G formula based on SCr of 2.57 mg/dL (H)). Liver Function Tests: Recent Labs  Lab 07/04/2020 0329  AST 83*  ALT 31  ALKPHOS 75  BILITOT 1.4*  PROT 6.6  ALBUMIN 3.3*   No results for input(s): LIPASE, AMYLASE in the last 168 hours. No results for input(s): AMMONIA in the last 168 hours. Coagulation Profile: Recent Labs  Lab 06/20/2020 0329  INR 1.8*   Cardiac Enzymes: No results for input(s): CKTOTAL, CKMB, CKMBINDEX, TROPONINI in the last 168 hours. BNP (last 3 results) No results for input(s): PROBNP in the last 8760 hours. HbA1C: No results for input(s): HGBA1C in the last 72 hours. CBG: No results for input(s): GLUCAP in the last 168 hours. Lipid Profile: No results for input(s): CHOL, HDL, LDLCALC, TRIG, CHOLHDL, LDLDIRECT in the last 72 hours. Thyroid Function Tests: No results for input(s): TSH, T4TOTAL, FREET4, T3FREE, THYROIDAB in the last 72 hours. Anemia Panel: No results for input(s): VITAMINB12, FOLATE, FERRITIN, TIBC, IRON, RETICCTPCT in the last 72 hours. Urine analysis:    Component Value Date/Time   COLORURINE YELLOW 12/14/2017  [place] 12/14/2017 0757   LABSPEC 1.020 04/25/2018 0925   PHURINE 6.5 04/25/2018 0925   GLUCOSEU NEGATIVE 04/25/2018 0925   HGBUR NEGATIVE 04/25/2018 0925   BILIRUBINUR NEGATIVE 04/25/2018 0925   KETONESUR NEGATIVE 04/25/2018 0925   PROTEINUR 30 (A) 04/25/2018 0925   UROBILINOGEN 0.2 04/25/2018 0925   NITRITE NEGATIVE 04/25/2018 0925   LEUKOCYTESUR TRACE (A) 04/25/2018 0925   Sepsis Labs: @LABRCNTIP (procalcitonin:4,lacticidven:4)  ) Recent Results (from the past 240 hour(s))  Resp Panel by RT-PCR (Flu A&B, Covid) Nasopharyngeal Swab     Status: None   Collection Time: 07/04/2020  3:29 AM   Specimen: Nasopharyngeal  Swab; Nasopharyngeal(NP) swabs in vial transport medium  Result Value Ref Range Status   SARS Coronavirus 2 by RT PCR NEGATIVE NEGATIVE Final    Comment: (NOTE) SARS-CoV-2 target nucleic acids are NOT DETECTED.  The SARS-CoV-2 RNA is generally detectable in upper respiratory specimens during the acute phase of infection. The lowest concentration of SARS-CoV-2 viral copies this assay can detect is 138 copies/mL. A negative result does not preclude SARS-Cov-2 infection and should not be used as the sole basis for treatment or other patient management decisions. A negative result may occur with  improper specimen collection/handling, submission of specimen other than nasopharyngeal swab, presence of viral mutation(s) within the areas targeted by this assay, and inadequate number of viral copies(<138 copies/mL). A negative result must be combined with clinical observations, patient history, and epidemiological information. The expected result is Negative.  Fact Sheet for Patients:  EntrepreneurPulse.com.au  Fact Sheet for Healthcare Providers:  IncredibleEmployment.be  This test is no t yet approved or cleared by the [place] FDA and  has been authorized for detection and/or diagnosis of SARS-CoV-2 by FDA  under an Emergency Use Authorization (EUA). This EUA will remain  in effect (meaning this test can be used) for the duration of the COVID-19 declaration under Section 564(b)(1) of the Act, 21 U.S.C.section 360bbb-3(b)(1), unless the authorization is terminated  or revoked sooner.       Influenza A by PCR NEGATIVE NEGATIVE Final   Influenza B by PCR NEGATIVE NEGATIVE Final    Comment: (NOTE) The Xpert Xpress SARS-CoV-2/FLU/RSV plus assay is intended as an aid in the diagnosis of influenza from Nasopharyngeal swab specimens and should not be used as a sole basis for treatment. Nasal washings and aspirates are unacceptable for Xpert Xpress SARS-CoV-2/FLU/RSV testing.  Fact Sheet for Patients: EntrepreneurPulse.com.au  Fact Sheet for Healthcare Providers: IncredibleEmployment.be  This test is not yet approved or cleared by the [place] FDA and has been authorized for detection and/or diagnosis of SARS-CoV-2 by FDA under an Emergency Use Authorization (EUA). This EUA will remain in effect (meaning this test can be used) for the duration of the COVID-19 declaration under Section 564(b)(1) of the Act, 21 U.S.C. section 360bbb-3(b)(1), unless the authorization is terminated or revoked.  Performed at [facility], [place] [address], [place], [place] [address]   Gram stain     Status: None   Collection Time: 06/12/20  1:35 PM   Specimen: PATH Cytology Pleural fluid  Result Value Ref Range Status   Specimen Description PLEURAL  Final   Special Requests NONE  Final   Gram Stain   Final    WBC PRESENT,BOTH PMN AND MONONUCLEAR NO ORGANISMS SEEN CYTOSPIN SMEAR Performed at [facility], [address], [place], [place] [address]    Report Status 07/03/2020 FINAL  Final      Studies: DG Chest 1 View  Result Date: 06/12/2020 CLINICAL DATA:  Status post right thoracentesis. Congestive heart failure. EXAM: CHEST  1 VIEW COMPARISON:   06/14/20 FINDINGS: Small to moderate right pleural effusion appears similar to previous study. Moderate left pleural effusion is increased in size. No pneumothorax visualized. Cardiomegaly and diffuse interstitial infiltrates appear stable. Bibasilar compressive atelectasis again noted due to the pleural effusions. IMPRESSION: No significant change in small to moderate right pleural effusion. No pneumothorax visualized. Increased moderate left pleural effusion. Stable cardiomegaly and diffuse interstitial infiltrates consistent with edema. Electronically Signed   By: Marlaine Hind M.D.   On: 06/12/2020 13:39  US THORACENTESIS ASP PLEURAL SPACE W/IMG GUIDE  Result Date: 06/12/2020 INDICATION: Patient with history of congestive heart failure, bilateral pleural effusions. Request is made for diagnostic and therapeutic thoracentesis EXAM: ULTRASOUND GUIDED DIAGNOSTIC AND THERAPEUTIC RIGHT THORACENTESIS MEDICATIONS: 10 mL 1% lidocaine COMPLICATIONS: None immediate. PROCEDURE: An ultrasound guided thoracentesis was thoroughly discussed with the patient and questions answered. The benefits, risks, alternatives and complications were also discussed. The patient understands and wishes to proceed with the procedure. Written consent was obtained. Ultrasound was performed to localize and mark an adequate pocket of fluid in the right chest. The area was then prepped and draped in the normal sterile fashion. 1% Lidocaine was used for local anesthesia. Under ultrasound guidance a 6 Fr Safe-T-Centesis catheter was introduced. Thoracentesis was performed. The catheter was removed and a dressing applied. FINDINGS: A total of approximately 1.0 liters of yellow fluid was removed. Samples were sent to the laboratory as requested by the clinical team. IMPRESSION: Successful ultrasound guided diagnostic and therapeutic right thoracentesis yielding 1.0 liters of pleural fluid. Read by: Brynda Greathouse PA-C Electronically Signed   By:  Jerilynn Mages.  Shick M.D.   On: 06/12/2020 13:49    Scheduled Meds: . amiodarone  200 mg Oral BID  . apixaban  2.5 mg Oral BID  . furosemide  20 mg Intravenous BID  . insulin aspart  0-6 Units Subcutaneous TID WC  . mouth rinse  15 mL Mouth Rinse BID  . sodium chloride flush  3 mL Intravenous Q12H    Continuous Infusions: . sodium chloride       LOS: 3 days     Kayleen Memos, MD [facility] Pager [phone]  If 7PM-7AM, please contact night-coverage [email or web address] Password [facility] Jun 18, 2020, 11:32 AM

## 2020-07-05 NOTE — Progress Notes (Signed)
   Pt is active with [facility].  Noted plan for dc to SNF. Will continue to follow hospital course.  Please call if [facility] can be of any assistance with d/c planning.  Wynetta Fines, RN  Office [phone] Mobile [phone]

## 2020-07-05 NOTE — Progress Notes (Signed)
Manufacturing engineer [facility])  Hospital Liaison RN note         Notified by [facility] manager of patient/family request for [facility] Palliative services at SNF (to be determined) after discharge.         Writer left a voicemail with patient's son [name].               [facility] Palliative team will follow up with patient after discharge.         Please call with any hospice or palliative related questions.         Thank you for the opportunity to participate in this patient's care.     Domenic Moras, BSN, RN [facility] Liaison (listed on [place] under Hosp[facility])    [phone] (24h on call)

## 2020-07-05 DEATH — deceased
